# Patient Record
Sex: Male | Born: 1950
Health system: Southern US, Community
[De-identification: ages and names within clinical notes are randomized; demographics above are authoritative.]

## PROBLEM LIST (undated history)

## (undated) DIAGNOSIS — M5126 Other intervertebral disc displacement, lumbar region: Secondary | ICD-10-CM

## (undated) DIAGNOSIS — I219 Acute myocardial infarction, unspecified: Secondary | ICD-10-CM

## (undated) DIAGNOSIS — I10 Essential (primary) hypertension: Secondary | ICD-10-CM

## (undated) DIAGNOSIS — M549 Dorsalgia, unspecified: Secondary | ICD-10-CM

## (undated) DIAGNOSIS — F329 Major depressive disorder, single episode, unspecified: Secondary | ICD-10-CM

## (undated) DIAGNOSIS — C801 Malignant (primary) neoplasm, unspecified: Secondary | ICD-10-CM

## (undated) DIAGNOSIS — R011 Cardiac murmur, unspecified: Secondary | ICD-10-CM

## (undated) DIAGNOSIS — I5189 Other ill-defined heart diseases: Secondary | ICD-10-CM

## (undated) DIAGNOSIS — G4730 Sleep apnea, unspecified: Secondary | ICD-10-CM

## (undated) DIAGNOSIS — F419 Anxiety disorder, unspecified: Secondary | ICD-10-CM

## (undated) DIAGNOSIS — E119 Type 2 diabetes mellitus without complications: Secondary | ICD-10-CM

## (undated) DIAGNOSIS — I2510 Atherosclerotic heart disease of native coronary artery without angina pectoris: Secondary | ICD-10-CM

## (undated) DIAGNOSIS — IMO0001 Reserved for inherently not codable concepts without codable children: Secondary | ICD-10-CM

## (undated) DIAGNOSIS — Z22322 Carrier or suspected carrier of Methicillin resistant Staphylococcus aureus: Secondary | ICD-10-CM

## (undated) DIAGNOSIS — D689 Coagulation defect, unspecified: Secondary | ICD-10-CM

## (undated) DIAGNOSIS — F32A Depression, unspecified: Secondary | ICD-10-CM

## (undated) DIAGNOSIS — G629 Polyneuropathy, unspecified: Secondary | ICD-10-CM

## (undated) DIAGNOSIS — I509 Heart failure, unspecified: Secondary | ICD-10-CM

## (undated) DIAGNOSIS — M1990 Unspecified osteoarthritis, unspecified site: Secondary | ICD-10-CM

## (undated) DIAGNOSIS — R9439 Abnormal result of other cardiovascular function study: Secondary | ICD-10-CM

## (undated) HISTORY — DX: Coagulation defect, unspecified: D68.9

## (undated) HISTORY — PX: TONSILLECTOMY: SUR1361

## (undated) HISTORY — PX: HEMORRHOID SURGERY: SHX153

## (undated) HISTORY — DX: Cardiac murmur, unspecified: R01.1

## (undated) HISTORY — PX: CARPAL TUNNEL RELEASE: SHX101

---

## 1995-03-16 HISTORY — PX: OTHER SURGICAL HISTORY: SHX169

## 2008-10-27 ENCOUNTER — Encounter
Admission: RE | Admit: 2008-10-27 | Discharge: 2008-10-27 | Payer: Self-pay | Admitting: Physical Medicine and Rehabilitation

## 2009-12-11 ENCOUNTER — Emergency Department (HOSPITAL_COMMUNITY): Admission: EM | Admit: 2009-12-11 | Discharge: 2009-12-11 | Payer: Self-pay | Admitting: Emergency Medicine

## 2009-12-15 ENCOUNTER — Emergency Department (HOSPITAL_COMMUNITY): Admission: EM | Admit: 2009-12-15 | Discharge: 2009-12-15 | Payer: Self-pay | Admitting: Emergency Medicine

## 2010-05-28 LAB — DIFFERENTIAL
Basophils Absolute: 0 10*3/uL (ref 0.0–0.1)
Basophils Absolute: 0.1 10*3/uL (ref 0.0–0.1)
Basophils Relative: 0 % (ref 0–1)
Basophils Relative: 1 % (ref 0–1)
Eosinophils Absolute: 0.2 10*3/uL (ref 0.0–0.7)
Eosinophils Relative: 2 % (ref 0–5)
Eosinophils Relative: 2 % (ref 0–5)
Lymphocytes Relative: 32 % (ref 12–46)
Lymphocytes Relative: 39 % (ref 12–46)
Lymphs Abs: 2.9 K/uL (ref 0.7–4.0)
Monocytes Absolute: 0.4 K/uL (ref 0.1–1.0)
Monocytes Absolute: 0.7 10*3/uL (ref 0.1–1.0)
Monocytes Relative: 4 % (ref 3–12)
Neutro Abs: 5.7 10*3/uL (ref 1.7–7.7)
Neutrophils Relative %: 62 % (ref 43–77)

## 2010-05-28 LAB — CBC
HCT: 47.6 % (ref 39.0–52.0)
Hemoglobin: 16.8 g/dL (ref 13.0–17.0)
Hemoglobin: 16.3 g/dL (ref 13.0–17.0)
MCH: 32.8 pg (ref 26.0–34.0)
MCH: 32.8 pg (ref 26.0–34.0)
MCHC: 35.3 g/dL (ref 30.0–36.0)
MCV: 92.8 fL (ref 78.0–100.0)
MCV: 93.6 fL (ref 78.0–100.0)
Platelets: 198 K/uL (ref 150–400)
Platelets: 199 10*3/uL (ref 150–400)
RBC: 5.13 MIL/uL (ref 4.22–5.81)
RBC: 4.97 MIL/uL (ref 4.22–5.81)
RDW: 13.4 % (ref 11.5–15.5)
WBC: 9.2 10*3/uL (ref 4.0–10.5)
WBC: 9.9 10*3/uL (ref 4.0–10.5)

## 2010-05-28 LAB — RAPID URINE DRUG SCREEN, HOSP PERFORMED
Amphetamines: NOT DETECTED
Barbiturates: NOT DETECTED
Barbiturates: NOT DETECTED
Benzodiazepines: NOT DETECTED
Cocaine: NOT DETECTED
Cocaine: NOT DETECTED
Opiates: NOT DETECTED
Opiates: NOT DETECTED
Tetrahydrocannabinol: NOT DETECTED
Tetrahydrocannabinol: NOT DETECTED

## 2010-05-28 LAB — BASIC METABOLIC PANEL WITH GFR
BUN: 12 mg/dL (ref 6–23)
CO2: 27 meq/L (ref 19–32)
Calcium: 9.7 mg/dL (ref 8.4–10.5)
Creatinine, Ser: 0.74 mg/dL (ref 0.4–1.5)
GFR calc non Af Amer: >60 mL/min (ref >60)
Glucose, Bld: 198 mg/dL — ABNORMAL HIGH (ref 70–99)
Sodium: 138 meq/L (ref 135–145)

## 2010-05-28 LAB — COMPREHENSIVE METABOLIC PANEL
AST: 44 U/L — ABNORMAL HIGH (ref 0–37)
Albumin: 3.8 g/dL (ref 3.5–5.2)
Alkaline Phosphatase: 55 U/L (ref 39–117)
BUN: 18 mg/dL (ref 6–23)
CO2: 24 mEq/L (ref 19–32)
Chloride: 99 mEq/L (ref 96–112)
Creatinine, Ser: 0.76 mg/dL (ref 0.4–1.5)
GFR calc non Af Amer: >60 mL/min (ref >60)
Potassium: 3.5 mEq/L (ref 3.5–5.1)
Total Bilirubin: 0.7 mg/dL (ref 0.3–1.2)

## 2010-05-28 LAB — ETHANOL: Alcohol, Ethyl (B): <5 mg/dL (ref 0–10)

## 2010-05-28 LAB — BASIC METABOLIC PANEL
Chloride: 101 mEq/L (ref 96–112)
GFR calc Af Amer: >60 mL/min (ref >60)
Potassium: 3.9 mEq/L (ref 3.5–5.1)

## 2010-05-28 LAB — TRICYCLICS SCREEN, URINE: TCA Scrn: NOT DETECTED

## 2011-01-08 ENCOUNTER — Ambulatory Visit (HOSPITAL_COMMUNITY)
Admission: RE | Admit: 2011-01-08 | Discharge: 2011-01-08 | Disposition: A | Discharge status: Home / Self Care | Payer: BC Managed Care – PPO | Source: Ambulatory Visit | Attending: Neurosurgery | Admitting: Neurosurgery

## 2011-01-08 ENCOUNTER — Encounter (HOSPITAL_COMMUNITY): Payer: Self-pay

## 2011-01-08 ENCOUNTER — Encounter (HOSPITAL_COMMUNITY)
Admission: RE | Admit: 2011-01-08 | Discharge: 2011-01-08 | Disposition: A | Discharge status: Home / Self Care | Payer: BC Managed Care – PPO | Source: Ambulatory Visit | Attending: Neurosurgery | Admitting: Neurosurgery

## 2011-01-08 ENCOUNTER — Other Ambulatory Visit (HOSPITAL_COMMUNITY): Payer: Self-pay | Admitting: Neurosurgery

## 2011-01-08 DIAGNOSIS — Z01810 Encounter for preprocedural cardiovascular examination: Secondary | ICD-10-CM | POA: Insufficient documentation

## 2011-01-08 DIAGNOSIS — Z01818 Encounter for other preprocedural examination: Secondary | ICD-10-CM | POA: Insufficient documentation

## 2011-01-08 DIAGNOSIS — Z01812 Encounter for preprocedural laboratory examination: Secondary | ICD-10-CM | POA: Insufficient documentation

## 2011-01-08 DIAGNOSIS — M4322 Fusion of spine, cervical region: Secondary | ICD-10-CM

## 2011-01-08 HISTORY — DX: Sleep apnea, unspecified: G47.30

## 2011-01-08 LAB — BASIC METABOLIC PANEL
Chloride: 97 mEq/L (ref 96–112)
GFR calc Af Amer: >90 mL/min (ref >90)
GFR calc non Af Amer: >90 mL/min (ref >90)
Potassium: 4.4 mEq/L (ref 3.5–5.1)
Sodium: 134 mEq/L — ABNORMAL LOW (ref 135–145)

## 2011-01-08 LAB — CBC
MCHC: 35.1 g/dL (ref 30.0–36.0)
Platelets: 209 10*3/uL (ref 150–400)
RDW: 13.7 % (ref 11.5–15.5)
WBC: 11.2 10*3/uL — ABNORMAL HIGH (ref 4.0–10.5)

## 2011-01-08 LAB — SURGICAL PCR SCREEN: Staphylococcus aureus: POSITIVE — AB

## 2011-01-08 NOTE — Pre-Procedure Instructions (Signed)
Jonathan Gilmore  01/08/2011  Your procedure is scheduled on:  NOV 5   Report to [facility] at 0745 AM.  Call this number if you have problems the morning of surgery: [phone]   Remember:   Do not eat food:After Midnight. 12 MIDNIGHT  Do not drink clear liquids: 4 Hours before arrival.  Take these medicines the morning of surgery with A SIP OF WATER: PAIN PILL   Do not wear jewelry, make-up or nail polish.  Do not wear lotions, powders, or perfumes. You may wear deodorant.  Do not shave 48 hours prior to surgery.  Do not bring valuables to the hospital.  Contacts, dentures or bridgework may not be worn into surgery.  Leave suitcase in the car. After surgery it may be brought to your room.  For patients admitted to the hospital, checkout time is 11:00 AM the day of discharge.   Patients discharged the day of surgery will not be allowed to drive home.  Name and phone number of your driver: Harlow Mares  [phone]  Special Instructions: CHG Shower Use Special Wash: 1/2 bottle night before surgery and 1/2 bottle morning of surgery. DO NOT              TAKE INSULIN   Please read over the following fact sheets that you were given: Pain Booklet and Coughing and Deep Breathing

## 2011-01-15 NOTE — Consult Note (Signed)
Anesthesia: CXR/labs/EKG reviewed.  Patient with hx of HTN, OSA, DM, former smoker.  No CP or SOB reported at PAT visit.  O2 Sat 94%.  If no acute cardiopulmonary symptoms day of surgery and without abnormal lung sounds, then anticipate can proceed with planned procedure.  Will need CBG checked pre-op.

## 2011-01-18 ENCOUNTER — Encounter (HOSPITAL_COMMUNITY)
Admission: RE | Disposition: A | Discharge status: Home / Self Care | Payer: Self-pay | Source: Ambulatory Visit | Attending: Neurosurgery

## 2011-01-18 ENCOUNTER — Ambulatory Visit (HOSPITAL_COMMUNITY): Payer: BC Managed Care – PPO | Admitting: Vascular Surgery

## 2011-01-18 ENCOUNTER — Encounter (HOSPITAL_COMMUNITY): Payer: Self-pay | Admitting: Vascular Surgery

## 2011-01-18 ENCOUNTER — Ambulatory Visit (HOSPITAL_COMMUNITY): Payer: BC Managed Care – PPO

## 2011-01-18 ENCOUNTER — Encounter (HOSPITAL_COMMUNITY): Payer: Self-pay | Admitting: *Deleted

## 2011-01-18 ENCOUNTER — Inpatient Hospital Stay (HOSPITAL_COMMUNITY)
Admission: RE | Admit: 2011-01-18 | Discharge: 2011-01-20 | DRG: 865 | Disposition: A | Discharge status: Home / Self Care | Payer: BC Managed Care – PPO | Source: Ambulatory Visit | Attending: Neurosurgery | Admitting: Neurosurgery

## 2011-01-18 DIAGNOSIS — E119 Type 2 diabetes mellitus without complications: Secondary | ICD-10-CM | POA: Diagnosis present

## 2011-01-18 DIAGNOSIS — R1310 Dysphagia, unspecified: Secondary | ICD-10-CM | POA: Diagnosis not present

## 2011-01-18 DIAGNOSIS — M4712 Other spondylosis with myelopathy, cervical region: Secondary | ICD-10-CM

## 2011-01-18 DIAGNOSIS — G4730 Sleep apnea, unspecified: Secondary | ICD-10-CM | POA: Diagnosis present

## 2011-01-18 DIAGNOSIS — Z794 Long term (current) use of insulin: Secondary | ICD-10-CM

## 2011-01-18 DIAGNOSIS — Z79899 Other long term (current) drug therapy: Secondary | ICD-10-CM

## 2011-01-18 DIAGNOSIS — M47812 Spondylosis without myelopathy or radiculopathy, cervical region: Principal | ICD-10-CM | POA: Diagnosis present

## 2011-01-18 DIAGNOSIS — Z7982 Long term (current) use of aspirin: Secondary | ICD-10-CM

## 2011-01-18 DIAGNOSIS — I10 Essential (primary) hypertension: Secondary | ICD-10-CM | POA: Diagnosis present

## 2011-01-18 HISTORY — PX: ANTERIOR CERVICAL DECOMP/DISCECTOMY FUSION: SHX1161

## 2011-01-18 LAB — GLUCOSE, CAPILLARY
Glucose-Capillary: 221 mg/dL — ABNORMAL HIGH (ref 70–99)
Glucose-Capillary: 225 mg/dL — ABNORMAL HIGH (ref 70–99)

## 2011-01-18 SURGERY — ANTERIOR CERVICAL DECOMPRESSION/DISCECTOMY FUSION 2 LEVELS
Anesthesia: General | Site: Spine Cervical | Wound class: Clean

## 2011-01-18 MED ORDER — PIOGLITAZONE HCL 15 MG PO TABS
15.0000 mg | ORAL_TABLET | Freq: Two times a day (BID) | ORAL | Status: DC
  Filled 2011-01-18 (×2): qty 1

## 2011-01-18 MED ORDER — OXYCODONE-ACETAMINOPHEN 7.5-325 MG PO TABS: 1.0000 | ORAL_TABLET | ORAL | Status: DC | PRN

## 2011-01-18 MED ORDER — ONDANSETRON HCL 4 MG/2ML IJ SOLN
INTRAMUSCULAR | Status: DC | PRN
  Administered 2011-01-18: 4 mg via INTRAVENOUS

## 2011-01-18 MED ORDER — PROPOFOL 10 MG/ML IV EMUL
INTRAVENOUS | Status: DC | PRN
  Administered 2011-01-18: 200 mg via INTRAVENOUS

## 2011-01-18 MED ORDER — CEFAZOLIN SODIUM 1-5 GM-% IV SOLN
1.0000 g | Freq: Three times a day (TID) | INTRAVENOUS | Status: AC
  Administered 2011-01-18 (×2): 1 g via INTRAVENOUS
  Filled 2011-01-18 (×2): qty 50

## 2011-01-18 MED ORDER — OXYCODONE-ACETAMINOPHEN 5-325 MG PO TABS
1.0000 | ORAL_TABLET | ORAL | Status: DC | PRN
  Administered 2011-01-19 – 2011-01-20 (×3): 2 via ORAL
  Filled 2011-01-18 (×3): qty 2

## 2011-01-18 MED ORDER — METFORMIN HCL 850 MG PO TABS
850.0000 mg | ORAL_TABLET | Freq: Two times a day (BID) | ORAL | Status: DC
  Filled 2011-01-18 (×2): qty 1

## 2011-01-18 MED ORDER — CYCLOBENZAPRINE HCL 10 MG PO TABS
10.0000 mg | ORAL_TABLET | Freq: Three times a day (TID) | ORAL | Status: DC | PRN
  Administered 2011-01-18 – 2011-01-20 (×5): 10 mg via ORAL
  Filled 2011-01-18 (×5): qty 1

## 2011-01-18 MED ORDER — ONDANSETRON HCL 4 MG/2ML IJ SOLN: 4.0000 mg | INTRAMUSCULAR | Status: DC | PRN

## 2011-01-18 MED ORDER — HYDROCODONE-ACETAMINOPHEN 5-325 MG PO TABS
1.0000 | ORAL_TABLET | ORAL | Status: DC | PRN
  Administered 2011-01-18 – 2011-01-19 (×4): 2 via ORAL
  Filled 2011-01-18 (×3): qty 2

## 2011-01-18 MED ORDER — PIOGLITAZONE HCL-METFORMIN HCL 15-850 MG PO TABS
2.0000 | ORAL_TABLET | Freq: Two times a day (BID) | ORAL | Status: DC
Start: 2011-01-18 — End: 2011-01-18

## 2011-01-18 MED ORDER — MEPERIDINE HCL 25 MG/ML IJ SOLN: 6.2500 mg | INTRAMUSCULAR | Status: DC | PRN

## 2011-01-18 MED ORDER — SODIUM CHLORIDE 0.9 % IJ SOLN
3.0000 mL | Freq: Two times a day (BID) | INTRAMUSCULAR | Status: DC
  Administered 2011-01-18 – 2011-01-19 (×3): 3 mL via INTRAVENOUS

## 2011-01-18 MED ORDER — ZOLPIDEM TARTRATE 5 MG PO TABS
5.0000 mg | ORAL_TABLET | Freq: Every evening | ORAL | Status: DC | PRN
  Administered 2011-01-18 – 2011-01-19 (×2): 5 mg via ORAL
  Filled 2011-01-18 (×2): qty 1

## 2011-01-18 MED ORDER — MENTHOL 3 MG MT LOZG: 1.0000 | LOZENGE | OROMUCOSAL | Status: DC | PRN

## 2011-01-18 MED ORDER — HYDROMORPHONE HCL PF 1 MG/ML IJ SOLN
0.2500 mg | INTRAMUSCULAR | Status: DC | PRN
  Administered 2011-01-18: 1 mg via INTRAVENOUS

## 2011-01-18 MED ORDER — CEFAZOLIN SODIUM 1-5 GM-% IV SOLN
INTRAVENOUS | Status: DC | PRN
  Administered 2011-01-18: 2 g via INTRAVENOUS

## 2011-01-18 MED ORDER — ACETAMINOPHEN 325 MG PO TABS: 650.0000 mg | ORAL_TABLET | ORAL | Status: DC | PRN

## 2011-01-18 MED ORDER — HEMOSTATIC AGENTS (NO CHARGE) OPTIME
TOPICAL | Status: DC | PRN
  Administered 2011-01-18: 1 via TOPICAL

## 2011-01-18 MED ORDER — LACTULOSE 10 GM/15ML PO SOLN
30.0000 g | Freq: Two times a day (BID) | ORAL | Status: DC
  Administered 2011-01-18 – 2011-01-19 (×2): 30 g via ORAL
  Filled 2011-01-18 (×6): qty 45

## 2011-01-18 MED ORDER — FENTANYL CITRATE 0.05 MG/ML IJ SOLN
INTRAMUSCULAR | Status: DC | PRN
  Administered 2011-01-18: 100 ug via INTRAVENOUS
  Administered 2011-01-18: 150 ug via INTRAVENOUS

## 2011-01-18 MED ORDER — METFORMIN HCL 500 MG PO TABS
500.0000 mg | ORAL_TABLET | Freq: Three times a day (TID) | ORAL | Status: DC
  Administered 2011-01-19 – 2011-01-20 (×4): 500 mg via ORAL
  Filled 2011-01-18 (×8): qty 1

## 2011-01-18 MED ORDER — HYDROMORPHONE HCL PF 1 MG/ML IJ SOLN
0.2500 mg | INTRAMUSCULAR | Status: DC | PRN
  Administered 2011-01-18 (×2): 0.5 mg via INTRAVENOUS

## 2011-01-18 MED ORDER — THERA M PLUS PO TABS
1.0000 | ORAL_TABLET | Freq: Every day | ORAL | Status: DC
  Administered 2011-01-18 – 2011-01-19 (×2): 1 via ORAL
  Filled 2011-01-18 (×3): qty 1

## 2011-01-18 MED ORDER — INSULIN ASPART 100 UNIT/ML ~~LOC~~ SOLN: 8.0000 [IU] | Freq: Three times a day (TID) | SUBCUTANEOUS | Status: DC

## 2011-01-18 MED ORDER — PHENOL 1.4 % MT LIQD: 1.0000 | OROMUCOSAL | Status: DC | PRN

## 2011-01-18 MED ORDER — ASPIRIN EC 81 MG PO TBEC
81.0000 mg | DELAYED_RELEASE_TABLET | Freq: Every day | ORAL | Status: DC
  Administered 2011-01-19: 81 mg via ORAL
  Filled 2011-01-18 (×2): qty 1

## 2011-01-18 MED ORDER — LISINOPRIL 20 MG PO TABS
20.0000 mg | ORAL_TABLET | Freq: Every day | ORAL | Status: DC
  Administered 2011-01-18 – 2011-01-19 (×2): 20 mg via ORAL
  Filled 2011-01-18 (×3): qty 1

## 2011-01-18 MED ORDER — LACTATED RINGERS IV SOLN
INTRAVENOUS | Status: DC | PRN
  Administered 2011-01-18 (×2): via INTRAVENOUS

## 2011-01-18 MED ORDER — CEFAZOLIN SODIUM 1-5 GM-% IV SOLN
1.0000 g | Freq: Three times a day (TID) | INTRAVENOUS | Status: DC
  Filled 2011-01-18 (×4): qty 50

## 2011-01-18 MED ORDER — INSULIN ASPART 100 UNIT/ML ~~LOC~~ SOLN
0.0000 [IU] | Freq: Once | SUBCUTANEOUS | Status: AC
  Administered 2011-01-18: 8 [IU] via SUBCUTANEOUS

## 2011-01-18 MED ORDER — INSULIN ASPART 100 UNIT/ML ~~LOC~~ SOLN
0.0000 [IU] | Freq: Every day | SUBCUTANEOUS | Status: DC
  Administered 2011-01-19: 3 [IU] via SUBCUTANEOUS

## 2011-01-18 MED ORDER — OXYCODONE HCL 5 MG PO TABS
5.0000 mg | ORAL_TABLET | ORAL | Status: DC | PRN
  Administered 2011-01-18: 10 mg via ORAL
  Filled 2011-01-18: qty 2

## 2011-01-18 MED ORDER — DOCUSATE SODIUM 100 MG PO CAPS
100.0000 mg | ORAL_CAPSULE | Freq: Two times a day (BID) | ORAL | Status: DC
  Administered 2011-01-18 – 2011-01-20 (×5): 100 mg via ORAL
  Filled 2011-01-18 (×5): qty 1

## 2011-01-18 MED ORDER — MULTI-VITAMIN/MINERALS PO TABS: 1.0000 | ORAL_TABLET | Freq: Every day | ORAL | Status: DC

## 2011-01-18 MED ORDER — THROMBIN 5000 UNITS EX KIT
PACK | CUTANEOUS | Status: DC | PRN
  Administered 2011-01-18: 2 via TOPICAL

## 2011-01-18 MED ORDER — ROCURONIUM BROMIDE 100 MG/10ML IV SOLN
INTRAVENOUS | Status: DC | PRN
  Administered 2011-01-18: 20 mg via INTRAVENOUS
  Administered 2011-01-18: 50 mg via INTRAVENOUS

## 2011-01-18 MED ORDER — MIDAZOLAM HCL 5 MG/5ML IJ SOLN
INTRAMUSCULAR | Status: DC | PRN
  Administered 2011-01-18: 2 mg via INTRAVENOUS

## 2011-01-18 MED ORDER — ONDANSETRON HCL 4 MG/2ML IJ SOLN
4.0000 mg | Freq: Once | INTRAMUSCULAR | Status: AC | PRN
  Administered 2011-01-18: 4 mg via INTRAVENOUS

## 2011-01-18 MED ORDER — SODIUM CHLORIDE 0.9 % IR SOLN
Status: DC | PRN
  Administered 2011-01-18 (×2)

## 2011-01-18 MED ORDER — GLYCOPYRROLATE 0.2 MG/ML IJ SOLN
INTRAMUSCULAR | Status: DC | PRN
  Administered 2011-01-18: .6 mg via INTRAVENOUS

## 2011-01-18 MED ORDER — INSULIN ASPART 100 UNIT/ML ~~LOC~~ SOLN
0.0000 [IU] | Freq: Three times a day (TID) | SUBCUTANEOUS | Status: DC
  Administered 2011-01-18: 8 [IU] via SUBCUTANEOUS
  Administered 2011-01-19: 5 [IU] via SUBCUTANEOUS
  Administered 2011-01-19: 3 [IU] via SUBCUTANEOUS
  Administered 2011-01-19: 5 [IU] via SUBCUTANEOUS
  Administered 2011-01-20: 3 [IU] via SUBCUTANEOUS
  Filled 2011-01-18: qty 3

## 2011-01-18 MED ORDER — HYDROMORPHONE HCL PF 1 MG/ML IJ SOLN
0.5000 mg | INTRAMUSCULAR | Status: DC | PRN
  Administered 2011-01-18: 1 mg via INTRAVENOUS
  Filled 2011-01-18: qty 1

## 2011-01-18 MED ORDER — SODIUM CHLORIDE 0.9 % IJ SOLN: 3.0000 mL | INTRAMUSCULAR | Status: DC | PRN

## 2011-01-18 MED ORDER — ACETAMINOPHEN 650 MG RE SUPP: 650.0000 mg | RECTAL | Status: DC | PRN

## 2011-01-18 MED ORDER — TESTOSTERONE CYPIONATE 100 MG/ML IM SOLN: 200.0000 mg | INTRAMUSCULAR | Status: DC

## 2011-01-18 MED ORDER — NEOSTIGMINE METHYLSULFATE 1 MG/ML IJ SOLN
INTRAMUSCULAR | Status: DC | PRN
  Administered 2011-01-18: 5 mg via INTRAVENOUS

## 2011-01-18 SURGICAL SUPPLY — 54 items
BAG DECANTER FOR FLEXI CONT (MISCELLANEOUS) ×2 IMPLANT
BENZOIN TINCTURE PRP APPL 2/3 (GAUZE/BANDAGES/DRESSINGS) ×2 IMPLANT
BRUSH SCRUB EZ PLAIN DRY (MISCELLANEOUS) ×2 IMPLANT
BUR MATCHSTICK NEURO 3.0 LAGG (BURR) ×2 IMPLANT
CANISTER SUCTION 2500CC (MISCELLANEOUS) ×2 IMPLANT
CLOSURE STERI STRIP 1/2 X4 (GAUZE/BANDAGES/DRESSINGS) ×2 IMPLANT
CLOTH BEACON ORANGE TIMEOUT ST (SAFETY) ×2 IMPLANT
CONT SPEC 4OZ CLIKSEAL STRL BL (MISCELLANEOUS) ×2 IMPLANT
DRAPE C-ARM 42X72 X-RAY (DRAPES) ×4 IMPLANT
DRAPE LAPAROTOMY 100X72 PEDS (DRAPES) ×2 IMPLANT
DRAPE MICROSCOPE LEICA (MISCELLANEOUS) ×2 IMPLANT
DRAPE POUCH INSTRU U-SHP 10X18 (DRAPES) ×2 IMPLANT
DRILL BIT (BIT) ×2 IMPLANT
ELECT COATED BLADE 2.86 ST (ELECTRODE) ×2 IMPLANT
ELECT REM PT RETURN 9FT ADLT (ELECTROSURGICAL) ×2
ELECTRODE REM PT RTRN 9FT ADLT (ELECTROSURGICAL) ×1 IMPLANT
GAUZE SPONGE 4X4 16PLY XRAY LF (GAUZE/BANDAGES/DRESSINGS) IMPLANT
GLOVE BIOGEL PI IND STRL 7.0 (GLOVE) ×1 IMPLANT
GLOVE BIOGEL PI INDICATOR 7.0 (GLOVE) ×1
GLOVE ECLIPSE 8.5 STRL (GLOVE) ×2 IMPLANT
GLOVE EXAM NITRILE LRG STRL (GLOVE) IMPLANT
GLOVE EXAM NITRILE MD LF STRL (GLOVE) IMPLANT
GLOVE EXAM NITRILE XL STR (GLOVE) IMPLANT
GLOVE EXAM NITRILE XS STR PU (GLOVE) IMPLANT
GLOVE OPTIFIT SS 6.5 STRL BRWN (GLOVE) ×4 IMPLANT
GLOVE SURG SS PI 7.0 STRL IVOR (GLOVE) ×2 IMPLANT
GOWN BRE IMP SLV AUR LG STRL (GOWN DISPOSABLE) ×2 IMPLANT
GOWN BRE IMP SLV AUR XL STRL (GOWN DISPOSABLE) ×4 IMPLANT
GOWN STRL REIN 2XL LVL4 (GOWN DISPOSABLE) IMPLANT
HEAD HALTER (SOFTGOODS) ×2 IMPLANT
HEMOSTAT SURGICEL 2X14 (HEMOSTASIS) IMPLANT
KIT BASIN OR (CUSTOM PROCEDURE TRAY) ×2 IMPLANT
KIT ROOM TURNOVER OR (KITS) ×2 IMPLANT
NEEDLE SPNL 20GX3.5 QUINCKE YW (NEEDLE) ×2 IMPLANT
NS IRRIG 1000ML POUR BTL (IV SOLUTION) ×2 IMPLANT
PACK LAMINECTOMY NEURO (CUSTOM PROCEDURE TRAY) ×2 IMPLANT
PAD ARMBOARD 7.5X6 YLW CONV (MISCELLANEOUS) ×6 IMPLANT
PLATE ACP ATL VISION 45MM (Plate) ×2 IMPLANT
RUBBERBAND STERILE (MISCELLANEOUS) ×4 IMPLANT
SCREW VA ALT VISION 4.0X13 (Screw) ×12 IMPLANT
SPACER BONE CORNERSTONE 7X14 (Orthopedic Implant) ×2 IMPLANT
SPACER BONE CORNERSTONE 8X14 (Orthopedic Implant) ×2 IMPLANT
SPONGE GAUZE 4X4 12PLY (GAUZE/BANDAGES/DRESSINGS) ×2 IMPLANT
SPONGE INTESTINAL PEANUT (DISPOSABLE) ×2 IMPLANT
SPONGE SURGIFOAM ABS GEL SZ50 (HEMOSTASIS) ×2 IMPLANT
STRIP CLOSURE SKIN 1/2X4 (GAUZE/BANDAGES/DRESSINGS) ×2 IMPLANT
SUT PDS AB 5-0 P3 18 (SUTURE) ×2 IMPLANT
SUT VIC AB 3-0 SH 8-18 (SUTURE) ×2 IMPLANT
SYR 20ML ECCENTRIC (SYRINGE) ×2 IMPLANT
TAPE CLOTH 4X10 WHT NS (GAUZE/BANDAGES/DRESSINGS) ×2 IMPLANT
TAPE CLOTH SURG 4X10 WHT LF (GAUZE/BANDAGES/DRESSINGS) ×2 IMPLANT
TOWEL OR 17X24 6PK STRL BLUE (TOWEL DISPOSABLE) ×2 IMPLANT
TOWEL OR 17X26 10 PK STRL BLUE (TOWEL DISPOSABLE) ×2 IMPLANT
WATER STERILE IRR 1000ML POUR (IV SOLUTION) ×2 IMPLANT

## 2011-01-18 NOTE — Op Note (Signed)
Date of operation 01/18/2011 Date of dictation 01/18/2011  Attending physician Julio Sicks Service neurosurgery Preoperative diagnoses: C5-6 C6-7 stenosis Postoperative diagnosis: Same Procedure name: C5-6 C6-7 anterior cervical discectomy and fusion with allograft and anterior plating Surgeon: Julio Sicks Asst. Dr. Wynetta Emery Anesthesia: Gen. oral endotracheal.  Indications: Mr. Prowse is a 60 year old male with history of neck and left upper cavity pain paresthesias and weakness consistent with a C6 and C7 her a copy failing conservative management. He presents now for C5-6 and C6-7 anterior cervical decompression infusion in hopes of improving his symptoms patient is aware the risks and benefits and wishes to proceed.  Operative note: Patient in the operating room placed on table in supine position. After adequate level of anesthesia is achieved the patient is positioned supine with his neck  Extended and held in place with halter traction. Anterior cervical region was prepped and draped sterilely. 10 blade used to make a linear skin incision overlying the C6 vertebral level. Dissection and prior carried down sharply to the platysma. This was then divided vertically and dissection proceeded on the medial border of the sternocleidomastoid muscle and carotid sheath. Trachea is then retracted towards the left prevertebral fascia stripped off the anterior spinal column. Longus colli muscles and elevated bilaterally somewhat scarred. Deep self-retaining retractors placed, intraoperative fluoroscopy space used and the levels were confirmed. The disc space at C5-6 and C6-7 were incised with a  15 blade in a  rectangular fashion. A wide disc space cleanout was performed with pituitary rongeurs, forward and backward angle Carlen curettes, Kerrison rongeurs,and  the high-speed drill. The disc was removed down to the posterior annulus. Microscope brought into the  the field and used throughout the remainder of the  discectomy. Remaining aspects of annulus were removed with his high-speed drill then the posterior longitudinal ligament was removed in a piecemeal fashion. Kerrison rongeurs were then used to undercut the bodies of C5 and C6. C6. Decompression of the nature of foramen. Wide anterior foraminotomies in the form of course exiting C6 nerve roots bilaterally. At this point a very 30 decompression achieved. There is no evidence of injury to thecal sac and root. We then irrigated out solution. Gelfoam with postoperative hemostasis which Ascent be good. Seizures and repeated at C6-7 again without complication. The space at C5-6 and C6-7 and prepared for fusion. Cornerstone allograft wedges and packed in place of birth levels and recessed with the 1 mm and the intervertebral margin tear 45 mg in his anterior cervical plate was then placed over the C5 and C6 and C7 levels. This infection or fluoroscopic guidance using 13 mm verbal sutures to each of the 3 levels. All 6 screws given a final tightening be solid and bone. Locking screws engaged at all 3 levels. Final images revealed additional varus or prior probable neuroma spine. Wound is then carried out MI solution. Hemostasis was assured with bipolar try repaired wounds and close in a typical fashion. Steri-Strips triggers were applied. There were no apparent complications. Patient tolerated the procedure well and returned to the recovery postop. Signed Julio Sicks

## 2011-01-18 NOTE — Anesthesia Postprocedure Evaluation (Signed)
  Anesthesia Post-op Note  Patient: Jonathan Gilmore  Procedure(s) Performed:  ANTERIOR CERVICAL DECOMPRESSION/DISCECTOMY FUSION 2 LEVELS - anterior cervical discectomy and fusion with allograft and plating, cervical five-six, cervical six-seven  Patient Location: PACU  Anesthesia Type: General  Level of Consciousness: awake and alert   Airway and Oxygen Therapy: Patient Spontanous Breathing and Patient connected to nasal cannula oxygen  Post-op Pain: moderate  Post-op Assessment: Post-op Vital signs reviewed, Patient's Cardiovascular Status Stable, Respiratory Function Stable, Patent Airway and Pain level controlled  Post-op Vital Signs: Reviewed and stable  Complications: No apparent anesthesia complications

## 2011-01-18 NOTE — Brief Op Note (Signed)
01/18/2011  4:01 PM  PATIENT:  Jonathan Gilmore  60 y.o. male  PRE-OPERATIVE DIAGNOSIS:  cervical five-six, cervical six-seven spondylosis with stenosis  POST-OPERATIVE DIAGNOSIS:  cervical five-six, cervical six-seven spondylosis with stenosis  PROCEDURE:  Procedure(s): ANTERIOR CERVICAL DECOMPRESSION/DISCECTOMY FUSION 2 LEVELS  SURGEON:  Glada Wickstrom  PHYSICIAN ASSISTANT:   ASSISTANTS: cram   ANESTHESIA:   general  EBL:  Total I/O In: 1700 [I.V.:1700] Out: 75 [Blood:75]  BLOOD ADMINISTERED:none  DRAINS: none   LOCAL MEDICATIONS USED:  NONE  SPECIMEN:  No Specimen  DISPOSITION OF SPECIMEN:  N/A  COUNTS:  YES  TOURNIQUET:  * No tourniquets in log *  DICTATION: .Dragon Dictation  PLAN OF CARE: Admit to inpatient   PATIENT DISPOSITION:  PACU - hemodynamically stable.   Delay start of Pharmacological VTE agent (>24hrs) due to surgical blood loss or risk of bleeding:  yes

## 2011-01-18 NOTE — Transfer of Care (Signed)
Immediate Anesthesia Transfer of Care Note  Patient: Jonathan Gilmore  Procedure(s) Performed:  ANTERIOR CERVICAL DECOMPRESSION/DISCECTOMY FUSION 2 LEVELS - anterior cervical discectomy and fusion with allograft and plating, cervical five-six, cervical six-seven  Patient Location: PACU  Anesthesia Type: General  Level of Consciousness: awake and alert   Airway & Oxygen Therapy: Patient Spontanous Breathing  Post-op Assessment: Report given to PACU RN  Post vital signs: stable  Complications: No apparent anesthesia complications

## 2011-01-18 NOTE — Anesthesia Procedure Notes (Addendum)
Procedure Name: Intubation Date/Time: 01/18/2011 10:14 AM Performed by: Caryn Bee Pre-anesthesia Checklist: Patient identified, Emergency Drugs available, Suction available, Patient being monitored and Timeout performed Patient Re-evaluated:Patient Re-evaluated prior to inductionOxygen Delivery Method: Circle System Utilized Preoxygenation: Pre-oxygenation with 100% oxygen Intubation Type: IV induction Ventilation: Mask ventilation without difficulty Laryngoscope Size: Mac and 4 Grade View: Grade II Tube type: Oral Tube size: 7.5 mm Number of attempts: 1 Airway Equipment and Method: stylet Placement Confirmation: ETT inserted through vocal cords under direct vision,  positive ETCO2 and breath sounds checked- equal and bilateral Secured at: 22 cm Tube secured with: Tape Dental Injury: Teeth and Oropharynx as per pre-operative assessment

## 2011-01-18 NOTE — H&P (Signed)
Jonathan Gilmore is an 60 y.o. male.   Chief Complaint: Neck pain HPI: A 60 year old patient with chronic neck pain failing conservative management. Pain is mostly nonradicular but occasionally associated with some numbness paresthesia. Workup demonstrated significant spondylosis and stenosis at C5-6 and C6-7.  Past Medical History  Diagnosis Date  . Hypertension   . Sleep apnea     tested at sleep apnea at [facility]  . Diabetes mellitus     since 1993    Past Surgical History  Procedure Date  . Bone spur removed     back in 1997  . Hemorrhoid surgery     No family history on file. Social History:  reports that he quit smoking 10 days ago. His smoking use included Cigarettes. He smoked 1 pack per day. He has quit using smokeless tobacco. He reports that he drinks alcohol. He reports that he does not use illicit drugs.  Allergies:  Allergies  Allergen Reactions  . Morphine And Related Nausea And Vomiting    No current facility-administered medications on file as of 01/18/2011.   Medications Prior to Admission  Medication Sig Dispense Refill  . aspirin EC 81 MG tablet Take 81 mg by mouth daily.       . Flaxseed, Linseed, (FLAXSEED OIL PO) Take 1 tablet by mouth daily.       . insulin lispro (HUMALOG) 100 UNIT/ML injection Inject 8 Units into the skin 3 (three) times daily before meals.       . lactulose (CHRONULAC) 10 GM/15ML solution Take 30 g by mouth 2 (two) times daily.        . metFORMIN (GLUCOPHAGE) 500 MG tablet Take 500 mg by mouth 3 (three) times daily with meals.       . Multiple Vitamins-Minerals (MULTIVITAMIN WITH MINERALS) tablet Take 1 tablet by mouth daily.       Marland Kitchen oxyCODONE-acetaminophen (PERCOCET) 7.5-325 MG per tablet Take 1-2 tablets by mouth every 4 (four) hours as needed. For pain       . pioglitazone-metformin (ACTOPLUS MET) 15-850 MG per tablet Take 2 tablets by mouth 2 (two) times daily with a meal. 2 tabs with lunch and 2 tabs at bedtime      .  testosterone cypionate (DEPOTESTOTERONE CYPIONATE) 100 MG/ML injection Inject 200 mg into the muscle every 14 (fourteen) days.         Results for orders placed during the hospital encounter of 01/18/11 (from the past 48 hour(s))  GLUCOSE, CAPILLARY     Status: Abnormal   Collection Time   01/18/11  7:41 AM      Component Value Range Comment   Glucose-Capillary 221 (*) 70 - 99 (mg/dL)    No results found.  Review of Systems  Constitutional: Negative.   HENT: Negative.   Eyes: Negative.   Respiratory: Negative.   Cardiovascular: Negative.   Gastrointestinal: Negative.   Genitourinary: Negative.   Musculoskeletal: Negative.   Skin: Negative.   Neurological: Negative.   Endo/Heme/Allergies: Negative.   Psychiatric/Behavioral: Negative.     Blood pressure 130/83, pulse 79, temperature 97.9 F (36.6 C), temperature source Oral, resp. rate 18, SpO2 95.00%. Physical Exam  Constitutional: He is oriented to person, place, and time. He appears well-developed.  HENT:  Head: Normocephalic.  Eyes: Conjunctivae and EOM are normal. Pupils are equal, round, and reactive to light.  Neck: No JVD present. No tracheal deviation present. No Brudzinski's sign and no Kernig's sign noted. No thyromegaly present.  Cardiovascular: Normal rate  and regular rhythm.   Respiratory: Effort normal. No stridor.  GI: Soft. Bowel sounds are normal.  Musculoskeletal: Normal range of motion.  Lymphadenopathy:    He has no cervical adenopathy.  Neurological: He is alert and oriented to person, place, and time. He has normal reflexes. He displays normal reflexes. No cranial nerve deficit. He exhibits normal muscle tone. Coordination normal. GCS eye subscore is 4. GCS verbal subscore is 5. GCS motor subscore is 6.  Skin: Skin is warm.     Assessment/Plan Cervical spondylosis with stenosis C5-6 C6-7. Plan for C5-6 C6-7 anterior cervical discectomy and fusion allograft and anterior plating. Risks and benefits have  been explained. Patient wishes to proceed.  Jonathan Gilmore A 01/18/2011, 7:58 AM

## 2011-01-18 NOTE — Anesthesia Preprocedure Evaluation (Addendum)
Anesthesia Evaluation  Patient identified by MRN, date of birth, ID band Patient awake    Reviewed: Allergy & Precautions, NPO status   History of Anesthesia Complications (+) AWARENESS UNDER ANESTHESIAHistory of anesthetic complications: Left shoulder surg 1997 [place] in middle of case.  I explained use of BIS.  Airway Mallampati: II TM Distance: >3 FB Neck ROM: Full    Dental No notable dental hx. (+) Teeth Intact   Pulmonary sleep apnea (uses cpap occ) and Continuous Positive Airway Pressure Ventilation , Current Smoker,  clear to auscultation        Cardiovascular hypertension, Pt. on medications Regular Normal    Neuro/Psych    GI/Hepatic   Endo/Other  Diabetes mellitus-, Well Controlled, Type 2, Insulin DependentMorbid obesity  Renal/GU      Musculoskeletal   Abdominal   Peds  Hematology   Anesthesia Other Findings   Reproductive/Obstetrics                      Anesthesia Physical Anesthesia Plan  ASA: III  Anesthesia Plan: General   Post-op Pain Management:    Induction: Intravenous  Airway Management Planned: Oral ETT  Additional Equipment:   Intra-op Plan:   Post-operative Plan: Extubation in OR  Informed Consent: I have reviewed the patients History and Physical, chart, labs and discussed the procedure including the risks, benefits and alternatives for the proposed anesthesia with the patient or authorized representative who has indicated his/her understanding and acceptance.   Dental advisory given  Plan Discussed with: CRNA and Surgeon  Anesthesia Plan Comments:         Anesthesia Quick Evaluation

## 2011-01-19 DIAGNOSIS — M4712 Other spondylosis with myelopathy, cervical region: Secondary | ICD-10-CM

## 2011-01-19 LAB — GLUCOSE, CAPILLARY
Glucose-Capillary: 213 mg/dL — ABNORMAL HIGH (ref 70–99)
Glucose-Capillary: 188 mg/dL — ABNORMAL HIGH (ref 70–99)
Glucose-Capillary: 259 mg/dL — ABNORMAL HIGH (ref 70–99)

## 2011-01-19 MED ORDER — HYDROCODONE-ACETAMINOPHEN 5-325 MG PO TABS: 1.0000 | ORAL_TABLET | ORAL | Status: DC | PRN

## 2011-01-19 MED ORDER — INFLUENZA VIRUS VACC SPLIT PF IM SUSP
0.5000 mL | Freq: Once | INTRAMUSCULAR | Status: AC
  Administered 2011-01-19: 0.5 mL via INTRAMUSCULAR
  Filled 2011-01-19: qty 0.5

## 2011-01-19 MED ORDER — CYCLOBENZAPRINE HCL 10 MG PO TABS: 10.0000 mg | ORAL_TABLET | Freq: Three times a day (TID) | ORAL | Status: AC | PRN

## 2011-01-19 NOTE — Discharge Summary (Signed)
Physician Discharge Summary  Patient ID: Jonathan Gilmore MRN: [id number] DOB/AGE: [birth year] 60 y.o.  Admit date: 01/18/2011 Discharge date: 01/19/2011  Admission Diagnoses:  Discharge Diagnoses:  Active Problems:  * No active hospital problems. *    Discharged Condition: good  Hospital Course: H. admitted and underwent uncomplicated anterior cervical discectomy and fusion at 2 levels. Postoperatively patient was awakened with improved pain. He has some continued neck soreness but his left upper extremity pain and function is improved. At time of discharge his wound is healing well. He died. Neurologically his exam is intact. Discharge hnonesults: none  Significant Diagnostic Studies:   Treatments: surgery: ACDF  Discharge Exam: Blood pressure 133/74, pulse 89, temperature 98.4 F (36.9 C), temperature source Oral, resp. rate 22, height 5\' 7"  (1.702 m), weight 110.678 kg (244 lb), SpO2 97.00%. General appearance: alert and cooperative Neck: no adenopathy, no carotid bruit, no JVD, supple, symmetrical, trachea midline and thyroid not enlarged, symmetric, no tenderness/mass/nodules Neurologic: Alert and oriented X 3, normal strength and tone. Normal symmetric reflexes. Normal coordination and gait Sensory: normal Motor: grossly normal Incision/Wound:Healing well  Disposition: Final discharge disposition not confirmed   Current Discharge Medication List    START taking these medications   Details  cyclobenzaprine (FLEXERIL) 10 MG tablet Take 1 tablet (10 mg total) by mouth 3 (three) times daily as needed for muscle spasms. Qty: 30 tablet, Refills: 1    HYDROcodone-acetaminophen (NORCO) 5-325 MG per tablet Take 1-2 tablets by mouth every 4 (four) hours as needed. Qty: 80 tablet, Refills: 1      CONTINUE these medications which have NOT CHANGED   Details  aspirin EC 81 MG tablet Take 81 mg by mouth daily.     Flaxseed, Linseed, (FLAXSEED OIL PO) Take 1 tablet by mouth daily.      insulin lispro (HUMALOG) 100 UNIT/ML injection Inject 8 Units into the skin 3 (three) times daily before meals.     lactulose (CHRONULAC) 10 GM/15ML solution Take 30 g by mouth 2 (two) times daily.      lisinopril (PRINIVIL,ZESTRIL) 20 MG tablet Take 20 mg by mouth daily.      metFORMIN (GLUCOPHAGE) 500 MG tablet Take 500 mg by mouth 3 (three) times daily with meals.     Multiple Vitamins-Minerals (MULTIVITAMIN WITH MINERALS) tablet Take 1 tablet by mouth daily.     oxyCODONE-acetaminophen (PERCOCET) 7.5-325 MG per tablet Take 1-2 tablets by mouth every 4 (four) hours as needed. For pain     pioglitazone-metformin (ACTOPLUS MET) 15-850 MG per tablet Take 2 tablets by mouth 2 (two) times daily with a meal. 2 tabs with lunch and 2 tabs at bedtime    testosterone cypionate (DEPOTESTOTERONE CYPIONATE) 100 MG/ML injection Inject 200 mg into the muscle every 14 (fourteen) days.          Signed: Windy Dudek A 01/19/2011, 8:02 AM

## 2011-01-19 NOTE — Progress Notes (Signed)
I came by to place patient on cpap for the night. Patient said he was leaving at 0830 in the morning & he didn't want to wear it tonight.

## 2011-01-19 NOTE — Progress Notes (Signed)
Incision is more swollen and area around incision is red.  Back of neck is red and swollen as well. Pt is c/o more difficulty swallowing than this morning.  Pt given ice pack.   MD notified and will come to see pt.  Will continue to monitor.

## 2011-01-19 NOTE — Progress Notes (Signed)
  Patient with some dysphagia.  No stridor. No SOB .  H/O sleep apnea.  Wound looks good.  A little normal swelling but no evidence of clot.  Trachea midline. BS equal bilaterally.  Neuro intact..  Imp:  Post op neck pain swelling.  No evidence of airway compromise.. Given patients size and distance from home we will observe for one more day.

## 2011-01-20 LAB — GLUCOSE, CAPILLARY: Glucose-Capillary: 199 mg/dL — ABNORMAL HIGH (ref 70–99)

## 2011-01-20 MED ORDER — OXYCODONE-ACETAMINOPHEN 7.5-325 MG PO TABS
1.0000 | ORAL_TABLET | ORAL | Status: DC | PRN
Start: 2011-01-20 — End: 2011-08-23

## 2011-01-20 NOTE — Progress Notes (Signed)
  Feeling better today.. Minimal neck pain, Swallowing better.  Motor exam intact.  stilll with some left ue parathesias, but improved from preop.  Wound healing well.     DC home

## 2011-01-20 NOTE — Discharge Summary (Signed)
  DC Addendum   Patient stayed an additional day due to neck pain.  Symptoms now better. Plan DC home.

## 2011-01-25 ENCOUNTER — Encounter (HOSPITAL_COMMUNITY): Payer: Self-pay | Admitting: Neurosurgery

## 2011-02-10 ENCOUNTER — Ambulatory Visit
Admission: RE | Admit: 2011-02-10 | Discharge: 2011-02-10 | Disposition: A | Discharge status: Home / Self Care | Payer: BC Managed Care – PPO | Source: Ambulatory Visit | Attending: Neurosurgery | Admitting: Neurosurgery

## 2011-02-10 ENCOUNTER — Other Ambulatory Visit: Payer: Self-pay | Admitting: Neurosurgery

## 2011-02-10 DIAGNOSIS — M542 Cervicalgia: Secondary | ICD-10-CM

## 2011-04-12 ENCOUNTER — Ambulatory Visit: Payer: BC Managed Care – PPO | Attending: Neurosurgery | Admitting: Physical Therapy

## 2011-04-12 DIAGNOSIS — M25539 Pain in unspecified wrist: Secondary | ICD-10-CM | POA: Insufficient documentation

## 2011-04-12 DIAGNOSIS — M542 Cervicalgia: Secondary | ICD-10-CM | POA: Insufficient documentation

## 2011-04-12 DIAGNOSIS — M25519 Pain in unspecified shoulder: Secondary | ICD-10-CM | POA: Insufficient documentation

## 2011-04-12 DIAGNOSIS — R5381 Other malaise: Secondary | ICD-10-CM | POA: Insufficient documentation

## 2011-04-12 DIAGNOSIS — IMO0001 Reserved for inherently not codable concepts without codable children: Secondary | ICD-10-CM | POA: Insufficient documentation

## 2011-04-19 ENCOUNTER — Encounter: Payer: BC Managed Care – PPO | Admitting: Physical Therapy

## 2011-05-26 ENCOUNTER — Other Ambulatory Visit: Payer: Self-pay | Admitting: Neurosurgery

## 2011-05-26 DIAGNOSIS — M542 Cervicalgia: Secondary | ICD-10-CM

## 2011-06-02 ENCOUNTER — Ambulatory Visit
Admission: RE | Admit: 2011-06-02 | Discharge: 2011-06-02 | Disposition: A | Discharge status: Home / Self Care | Payer: BC Managed Care – PPO | Source: Ambulatory Visit | Attending: Neurosurgery | Admitting: Neurosurgery

## 2011-06-02 DIAGNOSIS — M542 Cervicalgia: Secondary | ICD-10-CM

## 2011-08-23 ENCOUNTER — Encounter (HOSPITAL_COMMUNITY): Payer: Self-pay

## 2011-08-23 ENCOUNTER — Emergency Department (HOSPITAL_COMMUNITY)
Admission: EM | Admit: 2011-08-23 | Discharge: 2011-08-23 | Disposition: A | Discharge status: Home / Self Care | Payer: BC Managed Care – PPO | Attending: Emergency Medicine | Admitting: Emergency Medicine

## 2011-08-23 DIAGNOSIS — I10 Essential (primary) hypertension: Secondary | ICD-10-CM | POA: Insufficient documentation

## 2011-08-23 DIAGNOSIS — M549 Dorsalgia, unspecified: Secondary | ICD-10-CM

## 2011-08-23 DIAGNOSIS — G4730 Sleep apnea, unspecified: Secondary | ICD-10-CM | POA: Insufficient documentation

## 2011-08-23 DIAGNOSIS — E119 Type 2 diabetes mellitus without complications: Secondary | ICD-10-CM | POA: Insufficient documentation

## 2011-08-23 DIAGNOSIS — M545 Low back pain, unspecified: Secondary | ICD-10-CM | POA: Insufficient documentation

## 2011-08-23 DIAGNOSIS — H6690 Otitis media, unspecified, unspecified ear: Secondary | ICD-10-CM | POA: Insufficient documentation

## 2011-08-23 HISTORY — DX: Dorsalgia, unspecified: M54.9

## 2011-08-23 LAB — URINALYSIS, ROUTINE W REFLEX MICROSCOPIC
Bilirubin Urine: NEGATIVE
Hgb urine dipstick: NEGATIVE
Specific Gravity, Urine: 1.015 (ref 1.005–1.030)
Urobilinogen, UA: 0.2 mg/dL (ref 0.0–1.0)

## 2011-08-23 MED ORDER — KETOROLAC TROMETHAMINE 60 MG/2ML IM SOLN
60.0000 mg | Freq: Once | INTRAMUSCULAR | Status: AC
  Administered 2011-08-23: 60 mg via INTRAMUSCULAR
  Filled 2011-08-23: qty 2

## 2011-08-23 MED ORDER — PREDNISONE 20 MG PO TABS
60.0000 mg | ORAL_TABLET | Freq: Once | ORAL | Status: AC
  Administered 2011-08-23: 60 mg via ORAL
  Filled 2011-08-23: qty 3

## 2011-08-23 MED ORDER — HYDROMORPHONE HCL PF 1 MG/ML IJ SOLN
1.0000 mg | Freq: Once | INTRAMUSCULAR | Status: AC
  Administered 2011-08-23: 1 mg via INTRAMUSCULAR
  Filled 2011-08-23: qty 1

## 2011-08-23 MED ORDER — HYDROCODONE-ACETAMINOPHEN 7.5-750 MG PO TABS: 1.0000 | ORAL_TABLET | Freq: Four times a day (QID) | ORAL | Status: DC | PRN

## 2011-08-23 MED ORDER — PREDNISONE 10 MG PO TABS: 20.0000 mg | ORAL_TABLET | Freq: Every day | ORAL | Status: DC

## 2011-08-23 MED ORDER — DIAZEPAM 5 MG PO TABS: 5.0000 mg | ORAL_TABLET | Freq: Four times a day (QID) | ORAL | Status: AC | PRN

## 2011-08-23 MED ORDER — DIAZEPAM 5 MG PO TABS
10.0000 mg | ORAL_TABLET | Freq: Once | ORAL | Status: AC
  Administered 2011-08-23: 10 mg via ORAL
  Filled 2011-08-23: qty 2

## 2011-08-23 MED ORDER — HYDROMORPHONE HCL PF 1 MG/ML IJ SOLN: 1.0000 mg | Freq: Once | INTRAMUSCULAR | Status: DC

## 2011-08-23 NOTE — Discharge Instructions (Signed)
Back Pain, Adult Low back pain is very common. About 1 in 5 people have back pain.The cause of low back pain is rarely dangerous. The pain often gets better over time.About half of people with a sudden onset of back pain feel better in just 2 weeks. About 8 in 10 people feel better by 6 weeks.  CAUSES Some common causes of back pain include:  Strain of the muscles or ligaments supporting the spine.   Wear and tear (degeneration) of the spinal discs.   Arthritis.   Direct injury to the back.  DIAGNOSIS Most of the time, the direct cause of low back pain is not known.However, back pain can be treated effectively even when the exact cause of the pain is unknown.Answering your caregiver's questions about your overall health and symptoms is one of the most accurate ways to make sure the cause of your pain is not dangerous. If your caregiver needs more information, he or she may order lab work or imaging tests (X-rays or MRIs).However, even if imaging tests show changes in your back, this usually does not require surgery. HOME CARE INSTRUCTIONS For many people, back pain returns.Since low back pain is rarely dangerous, it is often a condition that people can learn to manageon their own.   Remain active. It is stressful on the back to sit or stand in one place. Do not sit, drive, or stand in one place for more than 30 minutes at a time. Take short walks on level surfaces as soon as pain allows.Try to increase the length of time you walk each day.   Do not stay in bed.Resting more than 1 or 2 days can delay your recovery.   Do not avoid exercise or work.Your body is made to move.It is not dangerous to be active, even though your back may hurt.Your back will likely heal faster if you return to being active before your pain is gone.   Pay attention to your body when you bend and lift. Many people have less discomfortwhen lifting if they bend their knees, keep the load close to their  bodies,and avoid twisting. Often, the most comfortable positions are those that put less stress on your recovering back.   Find a comfortable position to sleep. Use a firm mattress and lie on your side with your knees slightly bent. If you lie on your back, put a pillow under your knees.   Only take over-the-counter or prescription medicines as directed by your caregiver. Over-the-counter medicines to reduce pain and inflammation are often the most helpful.Your caregiver may prescribe muscle relaxant drugs.These medicines help dull your pain so you can more quickly return to your normal activities and healthy exercise.   Put ice on the injured area.   Put ice in a plastic bag.   Place a towel between your skin and the bag.   Leave the ice on for 15 to 20 minutes, 3 to 4 times a day for the first 2 to 3 days. After that, ice and heat may be alternated to reduce pain and spasms.   Ask your caregiver about trying back exercises and gentle massage. This may be of some benefit.   Avoid feeling anxious or stressed.Stress increases muscle tension and can worsen back pain.It is important to recognize when you are anxious or stressed and learn ways to manage it.Exercise is a great option.  SEEK MEDICAL CARE IF:  You have pain that is not relieved with rest or medicine.   You have   pain that does not improve in 1 week.   You have new symptoms.   You are generally not feeling well.  SEEK IMMEDIATE MEDICAL CARE IF:   You have pain that radiates from your back into your legs.   You develop new bowel or bladder control problems.   You have unusual weakness or numbness in your arms or legs.   You develop nausea or vomiting.   You develop abdominal pain.   You feel faint.  Document Released: 03/01/2005 Document Revised: 02/18/2011 Document Reviewed: 07/20/2010 [employer] Patient Information 2012 [employer]. 

## 2011-08-23 NOTE — ED Provider Notes (Addendum)
History  This chart was scribed for Jonathan Baker, MD by Bennett Scrape. This patient was seen in room APA12/APA12 and the patient's care was started at 7:18AM.  CSN: [id number]  Arrival date & time 08/23/11  4098   First MD Initiated Contact with Patient 08/23/11 [phone]      Chief Complaint  Patient presents with  . Back Pain    The history is provided by the patient. No language interpreter was used.    Jonathan Gilmore is a 61 y.o. male with a h/o chronic back pain who presents to the Emergency Department complaining of one week of gradual onset, gradually worsening, constant lower back pain that radiates down the left leg. He states that the pain was so severe this morning after he woke up that he was unable to walk. He reports prior episodes of less severe pain. Pt states that he had neck surgery last year and was weaned off the pain medication. He denies currently being on pain medications. He denies new injury. Pt reports that oxycodone and hydrocodone have improved his symptoms in the past. He denies a change in bowel or bladder function. He denies any other symptoms. He also has a h/o HTN, sleep apnea and DM. He is a former smoker but denies alcohol use.   Past Medical History  Diagnosis Date  . Hypertension   . Sleep apnea     tested at sleep apnea at [facility]  . Diabetes mellitus     since 1993  . Back pain     Past Surgical History  Procedure Date  . Bone spur removed     back in 1997  . Hemorrhoid surgery   . Anterior cervical decomp/discectomy fusion 01/18/2011    Procedure: ANTERIOR CERVICAL DECOMPRESSION/DISCECTOMY FUSION 2 LEVELS;  Surgeon: Kathaleen Maser Pool;  Location: [facility];  Service: Neurosurgery;  Laterality: N/A;  anterior cervical discectomy and fusion with allograft and plating, cervical five-six, cervical six-seven    No family history on file.  History  Substance Use Topics  . Smoking status: Former Smoker -- 1.0 packs/day    Types:  Cigarettes    Quit date: 01/08/2011  . Smokeless tobacco: Former Neurosurgeon  . Alcohol Use: No      Review of Systems  Constitutional: Negative for fever.       10 Systems reviewed and are negative for acute change except as noted in the HPI.  HENT: Negative for rhinorrhea and neck pain.   Eyes: Negative for visual disturbance.  Respiratory: Negative for cough and shortness of breath.   Cardiovascular: Negative for chest pain.  Gastrointestinal: Negative for vomiting, abdominal pain and diarrhea.  Genitourinary: Negative for dysuria, frequency and hematuria.  Musculoskeletal: Positive for back pain.  Skin: Negative for rash.  Neurological: Negative for weakness, numbness and headaches.    Allergies  Morphine and related  Home Medications   Current Outpatient Rx  Name Route Sig Dispense Refill  . ASPIRIN EC 81 MG PO TBEC Oral Take 81 mg by mouth daily.     Marland Kitchen FLAXSEED OIL PO Oral Take 1 tablet by mouth daily.     Marland Kitchen HYDROCODONE-ACETAMINOPHEN 5-325 MG PO TABS Oral Take 1-2 tablets by mouth every 4 (four) hours as needed. 80 tablet 1  . INSULIN LISPRO (HUMAN) 100 UNIT/ML [place] SOLN Subcutaneous Inject 8 Units into the skin 3 (three) times daily before meals.     Marland Kitchen LACTULOSE 10 GM/15ML PO SOLN Oral Take 30 g by mouth  2 (two) times daily.      Marland Kitchen LISINOPRIL 20 MG PO TABS Oral Take 20 mg by mouth daily.      Marland Kitchen METFORMIN HCL 500 MG PO TABS Oral Take 500 mg by mouth 3 (three) times daily with meals.     . MULTI-VITAMIN/MINERALS PO TABS Oral Take 1 tablet by mouth daily.     . OXYCODONE-ACETAMINOPHEN 7.5-325 MG PO TABS Oral Take 1-2 tablets by mouth every 4 (four) hours as needed for pain. For pain 90 tablet 0  . PIOGLITAZONE HCL-METFORMIN HCL 15-850 MG PO TABS Oral Take 2 tablets by mouth 2 (two) times daily with a meal. 2 tabs with lunch and 2 tabs at bedtime    . TESTOSTERONE CYPIONATE 100 MG/ML IM OIL Intramuscular Inject 200 mg into the muscle every 14 (fourteen) days.       Triage Vitals:  BP 163/96  Pulse 76  Temp(Src) 98.2 F (36.8 C) (Oral)  Resp 18  Ht 5\' 6"  (1.676 m)  Wt 230 lb (104.327 kg)  BMI 37.12 kg/m2  SpO2 95%  Physical Exam  Nursing note and vitals reviewed. Constitutional: He is oriented to person, place, and time. He appears well-developed and well-nourished. No distress.  HENT:  Head: Normocephalic and atraumatic.  Eyes: Conjunctivae and EOM are normal.  Neck: Neck supple. No tracheal deviation present.  Cardiovascular: Normal rate.   Pulmonary/Chest: Effort normal. No respiratory distress.  Abdominal: He exhibits no distension.  Musculoskeletal: Normal range of motion.       Pain to palpation in the left lower paraspinal muscles, strength is normal  Neurological: He is alert and oriented to person, place, and time. No sensory deficit.       Trace patellar reflexes, normal straight leg reflexes, sensation is intact in the lower extremities  Skin: Skin is dry.  Psychiatric: He has a normal mood and affect. His behavior is normal.    ED Course  Procedures (including critical care time)  DIAGNOSTIC STUDIES: Oxygen Saturation is 95% on room air, adequate by my interpretation.    COORDINATION OF CARE: 7:28AM-Discussed treatment plan of pain medication and urinalysis with pt and pt agreed to plan. Advised pt that he will have to schedule a MRI as an outpatient due to lack of neurological findings. Pt states that he will follow up with Dr. Skip Estimable in [employer].     Labs Reviewed  URINALYSIS, ROUTINE W REFLEX MICROSCOPIC   No results found.   No diagnosis found.    MDM     I personally performed the services described in this documentation, which was scribed in my presence. The recorded information has been reviewed and considered.   9:47 AM Patient given pain medication feels better now. Repeat neurological exam remains stable. He will followup with his neurosurgeon. No concern for cauda equina or acute neurosurgical  condition      Jonathan Baker, MD 08/23/11 7829  Jonathan Baker, MD 08/23/11 [phone]

## 2011-08-23 NOTE — ED Notes (Signed)
Pt reports chronic lower back pain.  Pt says when woke up this am was unable to walk due to pain.  REports has 2 herniated discs in lower back.  Denies injury.

## 2011-09-08 ENCOUNTER — Other Ambulatory Visit: Payer: Self-pay | Admitting: Neurosurgery

## 2011-09-08 DIAGNOSIS — M542 Cervicalgia: Secondary | ICD-10-CM

## 2011-09-10 ENCOUNTER — Ambulatory Visit
Admission: RE | Admit: 2011-09-10 | Discharge: 2011-09-10 | Disposition: A | Discharge status: Home / Self Care | Payer: BC Managed Care – PPO | Source: Ambulatory Visit | Attending: Neurosurgery | Admitting: Neurosurgery

## 2011-09-10 DIAGNOSIS — M542 Cervicalgia: Secondary | ICD-10-CM

## 2012-01-17 ENCOUNTER — Other Ambulatory Visit: Payer: Self-pay | Admitting: Neurosurgery

## 2012-01-31 ENCOUNTER — Other Ambulatory Visit: Payer: Self-pay | Admitting: Neurosurgery

## 2012-02-29 ENCOUNTER — Other Ambulatory Visit: Payer: Self-pay | Admitting: Neurosurgery

## 2012-02-29 DIAGNOSIS — M542 Cervicalgia: Secondary | ICD-10-CM

## 2012-03-02 ENCOUNTER — Other Ambulatory Visit: Payer: Self-pay | Admitting: Neurosurgery

## 2012-03-02 DIAGNOSIS — M542 Cervicalgia: Secondary | ICD-10-CM

## 2012-03-04 ENCOUNTER — Other Ambulatory Visit: Payer: BC Managed Care – PPO

## 2012-03-06 ENCOUNTER — Other Ambulatory Visit: Payer: Self-pay | Admitting: Neurosurgery

## 2012-03-09 ENCOUNTER — Ambulatory Visit
Admission: RE | Admit: 2012-03-09 | Discharge: 2012-03-09 | Disposition: A | Discharge status: Home / Self Care | Payer: BC Managed Care – PPO | Source: Ambulatory Visit | Attending: Neurosurgery | Admitting: Neurosurgery

## 2012-03-09 DIAGNOSIS — M542 Cervicalgia: Secondary | ICD-10-CM

## 2012-04-03 ENCOUNTER — Other Ambulatory Visit: Payer: Self-pay | Admitting: Neurosurgery

## 2012-04-14 ENCOUNTER — Other Ambulatory Visit: Payer: Self-pay | Admitting: Neurosurgery

## 2012-06-12 ENCOUNTER — Encounter (INDEPENDENT_AMBULATORY_CARE_PROVIDER_SITE_OTHER): Payer: Self-pay

## 2012-09-16 IMAGING — RF DG CERVICAL SPINE 1V
1 series · 1 of 1 positions shown · non-contrast
Comparison: None

CLINICAL DATA: ACDF C5-C7.

DG SPINE PORTABLE - 1 VIEW

[Series 1: run · 1 of 1 slices shown]
[im 1/1]
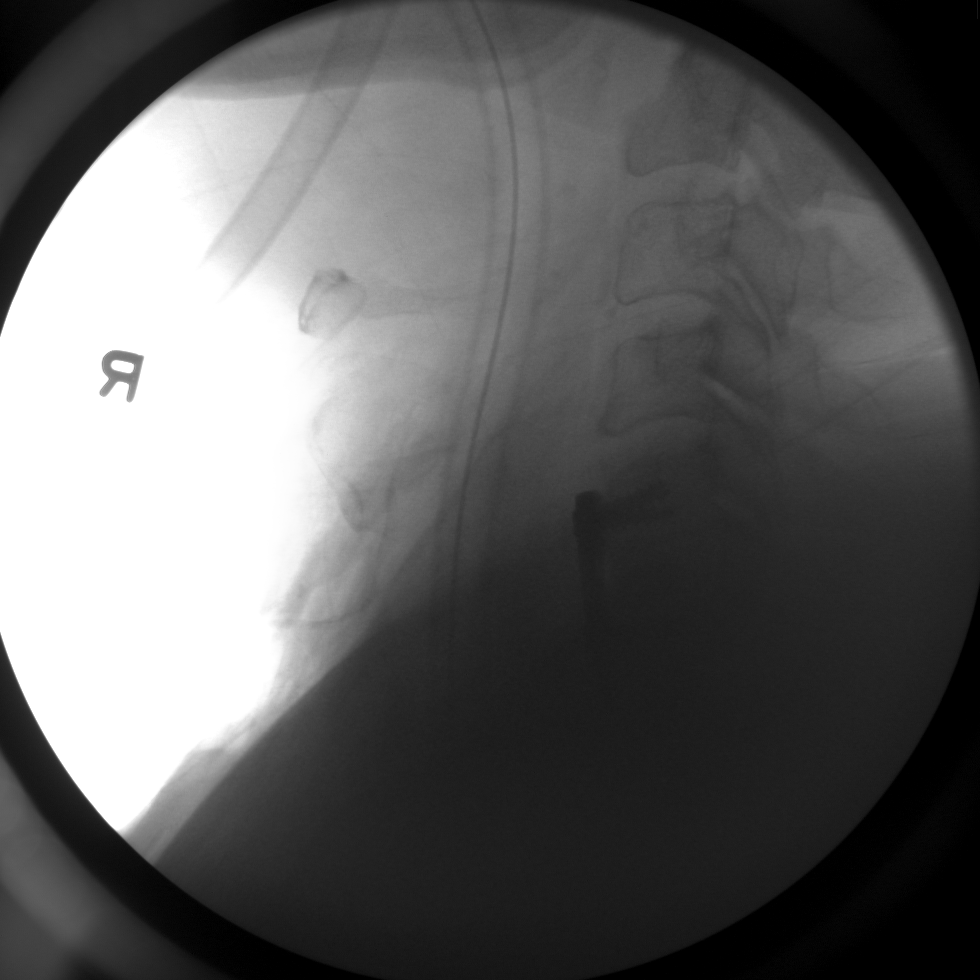

[1 of 1 positions shown; findings below may reference images not displayed]

FINDINGS: Single lateral intraoperative image demonstrates changes
of ACDF.  The superior aspect of the plate is noted at C5.  Cannot
visualize below the C5 level due to overlying shoulders.
IMPRESSION: ACDF, reportedly C5-C7.  Cannot visualize below the C5 level due to
overlying shoulders.

## 2012-09-20 ENCOUNTER — Other Ambulatory Visit: Payer: Self-pay | Admitting: Neurosurgery

## 2012-09-20 DIAGNOSIS — M542 Cervicalgia: Secondary | ICD-10-CM

## 2012-09-25 ENCOUNTER — Ambulatory Visit
Admission: RE | Admit: 2012-09-25 | Discharge: 2012-09-25 | Disposition: A | Discharge status: Home / Self Care | Payer: BC Managed Care – PPO | Source: Ambulatory Visit | Attending: Neurosurgery | Admitting: Neurosurgery

## 2012-09-25 DIAGNOSIS — M542 Cervicalgia: Secondary | ICD-10-CM

## 2013-02-24 ENCOUNTER — Encounter (HOSPITAL_COMMUNITY): Payer: Self-pay | Admitting: Emergency Medicine

## 2013-02-24 ENCOUNTER — Observation Stay (HOSPITAL_COMMUNITY)
Admission: EM | Admit: 2013-02-24 | Discharge: 2013-02-25 | Disposition: A | Discharge status: Home / Self Care | Payer: BC Managed Care – PPO | Attending: Family Medicine | Admitting: Family Medicine

## 2013-02-24 ENCOUNTER — Emergency Department (HOSPITAL_COMMUNITY): Payer: BC Managed Care – PPO

## 2013-02-24 DIAGNOSIS — E1100 Type 2 diabetes mellitus with hyperosmolarity without nonketotic hyperglycemic-hyperosmolar coma (NKHHC): Secondary | ICD-10-CM

## 2013-02-24 DIAGNOSIS — E1169 Type 2 diabetes mellitus with other specified complication: Secondary | ICD-10-CM

## 2013-02-24 DIAGNOSIS — R071 Chest pain on breathing: Principal | ICD-10-CM | POA: Insufficient documentation

## 2013-02-24 DIAGNOSIS — I700 Atherosclerosis of aorta: Secondary | ICD-10-CM | POA: Insufficient documentation

## 2013-02-24 DIAGNOSIS — I10 Essential (primary) hypertension: Secondary | ICD-10-CM

## 2013-02-24 DIAGNOSIS — I2510 Atherosclerotic heart disease of native coronary artery without angina pectoris: Secondary | ICD-10-CM

## 2013-02-24 DIAGNOSIS — R0781 Pleurodynia: Secondary | ICD-10-CM | POA: Diagnosis present

## 2013-02-24 DIAGNOSIS — E781 Pure hyperglyceridemia: Secondary | ICD-10-CM | POA: Insufficient documentation

## 2013-02-24 DIAGNOSIS — I319 Disease of pericardium, unspecified: Secondary | ICD-10-CM

## 2013-02-24 DIAGNOSIS — R079 Chest pain, unspecified: Secondary | ICD-10-CM

## 2013-02-24 DIAGNOSIS — R011 Cardiac murmur, unspecified: Secondary | ICD-10-CM

## 2013-02-24 DIAGNOSIS — E119 Type 2 diabetes mellitus without complications: Secondary | ICD-10-CM

## 2013-02-24 LAB — POCT I-STAT, CHEM 8
BUN: 10 mg/dL (ref 6–23)
Calcium, Ion: 1.2 mmol/L (ref 1.13–1.30)
Chloride: 99 mEq/L (ref 96–112)
Creatinine, Ser: 0.6 mg/dL (ref 0.50–1.35)
Glucose, Bld: 322 mg/dL — ABNORMAL HIGH (ref 70–99)
HCT: 49 % (ref 39.0–52.0)
Hemoglobin: 16.7 g/dL (ref 13.0–17.0)
Potassium: 3.8 mEq/L (ref 3.5–5.1)
Sodium: 139 mEq/L (ref 135–145)
TCO2: 25 mmol/L (ref 0–100)

## 2013-02-24 LAB — GLUCOSE, CAPILLARY: Glucose-Capillary: 276 mg/dL — ABNORMAL HIGH (ref 70–99)

## 2013-02-24 LAB — TROPONIN I
Troponin I: <0.3 ng/mL (ref <0.30)
Troponin I: <0.3 ng/mL (ref <0.30)

## 2013-02-24 MED ORDER — ENOXAPARIN SODIUM 120 MG/0.8ML ~~LOC~~ SOLN
1.0000 mg/kg | Freq: Once | SUBCUTANEOUS | Status: AC
  Administered 2013-02-24: 100 mg via SUBCUTANEOUS
  Filled 2013-02-24: qty 0.8

## 2013-02-24 MED ORDER — SODIUM CHLORIDE 0.9 % IJ SOLN: 3.0000 mL | Freq: Two times a day (BID) | INTRAMUSCULAR | Status: DC

## 2013-02-24 MED ORDER — ONDANSETRON HCL 4 MG PO TABS: 4.0000 mg | ORAL_TABLET | Freq: Four times a day (QID) | ORAL | Status: DC | PRN

## 2013-02-24 MED ORDER — ASPIRIN EC 81 MG PO TBEC
81.0000 mg | DELAYED_RELEASE_TABLET | Freq: Every day | ORAL | Status: DC
  Administered 2013-02-25: 81 mg via ORAL
  Filled 2013-02-24: qty 1

## 2013-02-24 MED ORDER — ACETAMINOPHEN 325 MG PO TABS: 650.0000 mg | ORAL_TABLET | Freq: Four times a day (QID) | ORAL | Status: DC | PRN

## 2013-02-24 MED ORDER — ASPIRIN 81 MG PO CHEW
324.0000 mg | CHEWABLE_TABLET | Freq: Once | ORAL | Status: AC
  Administered 2013-02-24: 324 mg via ORAL
  Filled 2013-02-24: qty 4

## 2013-02-24 MED ORDER — LORAZEPAM 0.5 MG PO TABS
0.5000 mg | ORAL_TABLET | Freq: Four times a day (QID) | ORAL | Status: DC | PRN
  Administered 2013-02-24: 0.5 mg via ORAL
  Filled 2013-02-24: qty 1

## 2013-02-24 MED ORDER — SODIUM CHLORIDE 0.9 % IJ SOLN
3.0000 mL | Freq: Two times a day (BID) | INTRAMUSCULAR | Status: DC
  Administered 2013-02-25: 3 mL via INTRAVENOUS

## 2013-02-24 MED ORDER — ACETAMINOPHEN 650 MG RE SUPP: 650.0000 mg | Freq: Four times a day (QID) | RECTAL | Status: DC | PRN

## 2013-02-24 MED ORDER — PANTOPRAZOLE SODIUM 40 MG PO TBEC
40.0000 mg | DELAYED_RELEASE_TABLET | Freq: Two times a day (BID) | ORAL | Status: DC
  Administered 2013-02-25: 40 mg via ORAL
  Filled 2013-02-24: qty 1

## 2013-02-24 MED ORDER — ONDANSETRON HCL 4 MG/2ML IJ SOLN: 4.0000 mg | Freq: Four times a day (QID) | INTRAMUSCULAR | Status: DC | PRN

## 2013-02-24 MED ORDER — IBUPROFEN 600 MG PO TABS
600.0000 mg | ORAL_TABLET | Freq: Three times a day (TID) | ORAL | Status: DC
  Administered 2013-02-24 – 2013-02-25 (×3): 600 mg via ORAL
  Filled 2013-02-24 (×3): qty 1

## 2013-02-24 MED ORDER — HYDROMORPHONE HCL PF 1 MG/ML IJ SOLN
1.0000 mg | INTRAMUSCULAR | Status: DC | PRN
  Administered 2013-02-24 – 2013-02-25 (×5): 1 mg via INTRAVENOUS
  Filled 2013-02-24 (×5): qty 1

## 2013-02-24 MED ORDER — ONDANSETRON HCL 4 MG/2ML IJ SOLN
4.0000 mg | Freq: Once | INTRAMUSCULAR | Status: AC
  Administered 2013-02-24: 4 mg via INTRAVENOUS

## 2013-02-24 MED ORDER — INSULIN ASPART 100 UNIT/ML ~~LOC~~ SOLN
0.0000 [IU] | Freq: Three times a day (TID) | SUBCUTANEOUS | Status: DC
Start: 2013-02-25 — End: 2013-02-24

## 2013-02-24 MED ORDER — NITROGLYCERIN 0.4 MG SL SUBL: 0.4000 mg | SUBLINGUAL_TABLET | SUBLINGUAL | Status: DC | PRN

## 2013-02-24 MED ORDER — POLYETHYLENE GLYCOL 3350 17 G PO PACK: 17.0000 g | PACK | Freq: Every day | ORAL | Status: DC | PRN

## 2013-02-24 MED ORDER — INSULIN ASPART 100 UNIT/ML ~~LOC~~ SOLN
0.0000 [IU] | Freq: Every day | SUBCUTANEOUS | Status: DC
  Administered 2013-02-24: 3 [IU] via SUBCUTANEOUS

## 2013-02-24 MED ORDER — INFLUENZA VAC SPLIT QUAD 0.5 ML IM SUSP
0.5000 mL | INTRAMUSCULAR | Status: AC
  Administered 2013-02-25: 0.5 mL via INTRAMUSCULAR
  Filled 2013-02-24: qty 0.5

## 2013-02-24 MED ORDER — ONDANSETRON HCL 4 MG/2ML IJ SOLN
4.0000 mg | Freq: Once | INTRAMUSCULAR | Status: DC
  Filled 2013-02-24: qty 2

## 2013-02-24 MED ORDER — SODIUM CHLORIDE 0.9 % IV BOLUS (SEPSIS)
1000.0000 mL | Freq: Once | INTRAVENOUS | Status: AC
  Administered 2013-02-24: 1000 mL via INTRAVENOUS

## 2013-02-24 MED ORDER — SODIUM CHLORIDE 0.9 % IV SOLN: 250.0000 mL | INTRAVENOUS | Status: DC | PRN

## 2013-02-24 MED ORDER — HYDROCODONE-ACETAMINOPHEN 10-325 MG PO TABS
1.0000 | ORAL_TABLET | Freq: Four times a day (QID) | ORAL | Status: DC | PRN
  Administered 2013-02-24 – 2013-02-25 (×2): 1 via ORAL
  Filled 2013-02-24 (×2): qty 1

## 2013-02-24 MED ORDER — KETOROLAC TROMETHAMINE 30 MG/ML IJ SOLN
30.0000 mg | Freq: Once | INTRAMUSCULAR | Status: AC
  Administered 2013-02-24: 30 mg via INTRAVENOUS
  Filled 2013-02-24: qty 1

## 2013-02-24 MED ORDER — HYDROMORPHONE HCL PF 1 MG/ML IJ SOLN
1.0000 mg | Freq: Once | INTRAMUSCULAR | Status: AC
  Administered 2013-02-24: 1 mg via INTRAVENOUS
  Filled 2013-02-24: qty 1

## 2013-02-24 MED ORDER — CALCIUM CARBONATE ANTACID 500 MG PO CHEW: 1.0000 | CHEWABLE_TABLET | Freq: Four times a day (QID) | ORAL | Status: DC | PRN

## 2013-02-24 MED ORDER — IOHEXOL 350 MG/ML SOLN
100.0000 mL | Freq: Once | INTRAVENOUS | Status: AC | PRN
  Administered 2013-02-24: 100 mL via INTRAVENOUS

## 2013-02-24 MED ORDER — COLCHICINE 0.6 MG PO TABS
0.6000 mg | ORAL_TABLET | Freq: Two times a day (BID) | ORAL | Status: DC
  Administered 2013-02-24 – 2013-02-25 (×2): 0.6 mg via ORAL
  Filled 2013-02-24 (×2): qty 1

## 2013-02-24 MED ORDER — SODIUM CHLORIDE 0.9 % IJ SOLN: 3.0000 mL | INTRAMUSCULAR | Status: DC | PRN

## 2013-02-24 MED ORDER — ATORVASTATIN CALCIUM 20 MG PO TABS
20.0000 mg | ORAL_TABLET | Freq: Every day | ORAL | Status: DC
  Administered 2013-02-24 – 2013-02-25 (×2): 20 mg via ORAL
  Filled 2013-02-24 (×2): qty 1

## 2013-02-24 MED ORDER — INSULIN ASPART 100 UNIT/ML ~~LOC~~ SOLN
0.0000 [IU] | Freq: Three times a day (TID) | SUBCUTANEOUS | Status: DC
  Administered 2013-02-24: 7 [IU] via SUBCUTANEOUS
  Administered 2013-02-25: 5 [IU] via SUBCUTANEOUS
  Administered 2013-02-25: 3 [IU] via SUBCUTANEOUS

## 2013-02-24 MED ORDER — PNEUMOCOCCAL VAC POLYVALENT 25 MCG/0.5ML IJ INJ
0.5000 mL | INJECTION | INTRAMUSCULAR | Status: AC
  Administered 2013-02-25: 0.5 mL via INTRAMUSCULAR
  Filled 2013-02-24: qty 0.5

## 2013-02-24 MED ORDER — ENOXAPARIN SODIUM 120 MG/0.8ML ~~LOC~~ SOLN
SUBCUTANEOUS | Status: AC
  Filled 2013-02-24: qty 0.8

## 2013-02-24 NOTE — ED Provider Notes (Signed)
CSN: [id number]     Arrival date & time 02/24/13  1217 History  This chart was scribed for Raeford Razor, MD by Luisa Dago, ED Scribe and Bennett Scrape, ED Scribe. This patient was seen in room APA02/APA02 and the patient's care was started at 12:39 PM.     Chief Complaint  Patient presents with  . Flank Pain  . Shoulder Pain  . Chest Pain  . Sore Throat    The history is provided by the patient. No language interpreter was used.   HPI Comments: Jonathan Gilmore is a 62 y.o. male who presents to the Emergency Department complaining of left sided mid back pain that radiates over to his left shoulder that started this morning. He was seen at the Urgent Care this morning for the same. He had a CXR that showed PNA and a d-dimer test that came back "elevated." No specific value located on paperwork he brought with him.  He was then advised to come to the ED. Pt denies SOB, leg swelling or leg pain. He denies having a h/o PE/DVT or any recent surgeries or long trips. Pt states that he has a history of diabetes and hypertension.   Past Medical History  Diagnosis Date  . Hypertension   . Sleep apnea     tested at sleep apnea at [facility]  . Diabetes mellitus     since 1993  . Back pain    Past Surgical History  Procedure Laterality Date  . Bone spur removed      back in 1997  . Hemorrhoid surgery    . Anterior cervical decomp/discectomy fusion  01/18/2011    Procedure: ANTERIOR CERVICAL DECOMPRESSION/DISCECTOMY FUSION 2 LEVELS;  Surgeon: Kathaleen Maser Pool;  Location: [facility];  Service: Neurosurgery;  Laterality: N/A;  anterior cervical discectomy and fusion with allograft and plating, cervical five-six, cervical six-seven   Family History  Problem Relation Age of Onset  . Cancer Mother   . Diabetes Father   . Diabetes Sister   . Cushing syndrome Sister    History  Substance Use Topics  . Smoking status: Current Every Day Smoker -- 1.00 packs/day for 50 years    Types:  Cigarettes  . Smokeless tobacco: Former Neurosurgeon  . Alcohol Use: No    Review of Systems  Respiratory: Positive for cough. Negative for shortness of breath.   Cardiovascular: Positive for chest pain.  Musculoskeletal: Positive for back pain. Negative for myalgias.  All other systems reviewed and are negative.    Allergies  Morphine and related  Home Medications   Current Outpatient Rx  Name  Route  Sig  Dispense  Refill  . metFORMIN (GLUCOPHAGE) 500 MG tablet   Oral   Take 500 mg by mouth 3 (three) times daily with meals.          Marland Kitchen aspirin EC 81 MG tablet   Oral   Take 81 mg by mouth daily.          . Cholecalciferol (VITAMIN D PO)   Oral   Take 1 tablet by mouth daily.         . Flaxseed, Linseed, (FLAXSEED OIL PO)   Oral   Take 1 tablet by mouth daily.          . Multiple Vitamin (MULTIVITAMIN WITH MINERALS) TABS   Oral   Take 1 tablet by mouth daily.         . predniSONE (DELTASONE) 10 MG tablet  Oral   Take 2 tablets (20 mg total) by mouth daily.   10 tablet   0   . testosterone cypionate (DEPOTESTOTERONE CYPIONATE) 100 MG/ML injection   Intramuscular   Inject 200 mg into the muscle every 14 (fourteen) days.           Triage Vital:BP 149/94  Pulse 99  Temp(Src) 97.6 F (36.4 C) (Oral)  Resp 14  Ht 5\' 7"  (1.702 m)  Wt 226 lb (102.513 kg)  BMI 35.39 kg/m2  SpO2 97%  Physical Exam  Nursing note and vitals reviewed. Constitutional: He is oriented to person, place, and time. He appears well-developed and well-nourished. No distress.  HENT:  Head: Normocephalic and atraumatic.  Eyes: Conjunctivae are normal. Right eye exhibits no discharge. Left eye exhibits no discharge.  Neck: Neck supple.  Cardiovascular: Normal rate, regular rhythm and normal heart sounds.  Exam reveals no gallop and no friction rub.   No murmur heard. Pulmonary/Chest: Effort normal. No respiratory distress. He has rhonchi.  LLL rhonchi.  Abdominal: Soft. He exhibits  no distension. There is no tenderness.  Musculoskeletal: He exhibits no edema and no tenderness.  Lower extremities symmetric as compared to each other. No calf tenderness. Negative Homan's. No palpable cords.   Neurological: He is alert and oriented to person, place, and time.  Skin: Skin is warm and dry.  Psychiatric: He has a normal mood and affect. His behavior is normal. Thought content normal.    ED Course  Procedures (including critical care time)  DIAGNOSTIC STUDIES: Oxygen Saturation is 97% on room air, adequate by my interpretation.    COORDINATION OF CARE: 12:47 PM-Discussed treatment plan which includes CT scan with pt at bedside and pt agreed to plan.     Labs Review Labs Reviewed  POCT I-STAT, CHEM 8 - Abnormal; Notable for the following:    Glucose, Bld 322 (*)    All other components within normal limits   Imaging Review Ct Angio Chest W/cm &/or Wo Cm  02/24/2013   CLINICAL DATA:  Pleuritic left-sided chest pain. Shortness of breath. Hyperglycemia. Elevated D-dimer. Rhonchi to the left lower lobe on physical examination.  EXAM: CT ANGIOGRAPHY CHEST WITH CONTRAST  TECHNIQUE: Multidetector CT imaging of the chest was performed using the standard protocol during bolus administration of intravenous contrast. Multiplanar CT image reconstructions including MIPs were obtained to evaluate the vascular anatomy.  CONTRAST:  OMNIPAQUE IOHEXOL 350 MG/ML IV.  COMPARISON:  None.  FINDINGS: Contrast opacification of the pulmonary arteries is very good. No filling defects within either main pulmonary artery or their branches in either lung to suggest pulmonary embolism. Heart size upper normal to borderline enlarged with mild left ventricular hypertrophy. Three vessel coronary atherosclerosis, severe involvement of the LAD. Very small pericardial effusion, localizing to the superior recess. Moderate atherosclerosis involving the thoracic and visualized upper abdominal aorta without  aneurysm or dissection. Atherosclerosis at the origins of the great vessels without significant stenosis.  Pulmonary parenchyma clear without localized airspace consolidation, interstitial disease, or parenchymal nodules or masses. No pleural effusions. Central airways patent with moderate to marked bronchial wall thickening. Scattered areas of hyperlucency in both lungs consistent with localized air trapping.  No significant mediastinal, hilar, or axillary lymphadenopathy. Visualized thyroid gland unremarkable. Fluid-filled, mildly dilated esophagus with borderline diffuse wall thickening. No visible esophageal mass. No evidence of hiatal hernia.  Visualized upper abdomen unremarkable for the early arterial phase of enhancement. Bone window images demonstrate diffuse thoracic spondylosis and DISH.  Review of the MIP images confirms the above findings.  IMPRESSION: 1. No evidence of pulmonary embolism. 2. Mild cardiomegaly with left ventricular hypertrophy. Very small pericardial effusion in the superior recess. Three vessel coronary atherosclerosis, severe involvement of the LAD. 3. Mildly dilated, fluid-filled esophagus with borderline diffuse wall thickening and no evidence of hiatal hernia. Query dysmotility and mild diffuse esophagitis. Does the patient have symptoms of esophagitis? 4. Moderate to marked bronchial wall thickening centrally and scattered areas of localized air trapping are consistent with asthma and/or bronchitis. No acute cardiopulmonary disease otherwise.   Electronically Signed   By: Hulan Saas M.D.   On: 02/24/2013 14:39    EKG Interpretation    Date/Time:  Saturday February 24 2013 12:18:51 EST Ventricular Rate:  99 PR Interval:  164 QRS Duration: 84 QT Interval:  372 QTC Calculation: 477 R Axis:   52 Text Interpretation:  Normal sinus rhythm Nonspecific ST and T wave abnormality Prolonged QT Confirmed by Juleen China  MD, Lainey Nelson ([id number]) on 02/24/2013 2:13:59 PM             MDM   1. Pleuritic pain   2. CAD (coronary artery disease)      62yM with L upper back pain which is pleuritic in nature. No evidence of PE or pneumonia on CT angiography. Does note CAD though including severe LAD disease. Symptoms very atypical for ACS. Probably prudent to admit for r/o though. EKG does have new changes in leads you'd expect for LAD disease although these are nonspecific. He endorses L shoulder pain and tingling in LUE but reports no change in chronic symptoms he attributes to rotator cuff. ASA given. Initial troponin normal. PCP Dr Rolly Pancake.   I personally preformed the services scribed in my presence. The recorded information has been reviewed is accurate. Raeford Razor, MD.     Raeford Razor, MD 02/24/13 [phone]

## 2013-02-24 NOTE — ED Notes (Signed)
Report given to Cindy, RN. Ready to receive patient. 

## 2013-02-24 NOTE — ED Notes (Signed)
Per patient went to urgent care for pain in left side, left shoulder, and sore throat. Patient had EKG and blood work done. Blood work showed elevated d-dimer was sent to ER for Ct to evaluate for PE. Patient reports chills last night but states he did not have pain till this morning. Denies any cardiac hx.

## 2013-02-24 NOTE — H&P (Signed)
History and Physical  CELESTE CANDELAS UJW:[id number] DOB: [birth year] DOA: 02/24/2013  Referring physician: Dr. Marylen Ponto in [place] PCP: No PCP Per Patient   Chief Complaint: Back pain  HPI:  62 year old man who presents the emergency department complaining of left-sided mid back pain radiating to left shoulder. He was in urgent care where chest x-ray suggested pneumonia and d-dimer was positive and this patient was referred to the emergency department with initial evaluation as below. Observation was requested for cardiac rule out.  History obtained from patient. No previous history of heart disease known. He works in Aeronautical engineer but has not previously had any exertional chest pain or shortness of breath. This morning when he woke up at approximately 8 AM he noticed sharp stabbing back pain on the left mid back radiating around his rib cage to the chest. Worsened with deep inspiration. Severe in nature. Improved with Dilaudid in the emergency department. He has chronic left shoulder pain and numbness in his fingers which is unchanged. Pain persisted, he was seen in urgent care as above. I have reviewed documentation. EKG shows sinus rhythm with no acute changes. WBC was 12.3. D-dimer was reported "positive" without a number.  In the emergency department noted to be afebrile stable vital signs. No hypoxia. Basic metabolic panel, hemoglobin, troponin unremarkable. EKG nonacute. CT angiogram of the chest negative for PE. Mild cardiomegaly with LVH was seen. Small pericardial effusion seen. Three-vessel coronary artery disease with severe involvement of the LAD. Other findings as listed below.  Review of Systems:  Negative for fever, visual changes, sore throat, rash, SOB, dysuria, bleeding, n/v/abdominal pain.  Past Medical History  Diagnosis Date  . Hypertension   . Sleep apnea     [facility]  . Diabetes mellitus     since 1993  . Back pain     Past Surgical History   Procedure Laterality Date  . Bone spur removed      back in 1997  . Hemorrhoid surgery    . Anterior cervical decomp/discectomy fusion  01/18/2011    Procedure: ANTERIOR CERVICAL DECOMPRESSION/DISCECTOMY FUSION 2 LEVELS;  Surgeon: Kathaleen Maser Pool;  Location: [facility];  Service: Neurosurgery;  Laterality: N/A;  anterior cervical discectomy and fusion with allograft and plating, cervical five-six, cervical six-seven    Social History:  reports that he has been smoking Cigarettes.  He has a 50 pack-year smoking history. He has quit using smokeless tobacco. He reports that he does not drink alcohol or use illicit drugs.  Allergies  Allergen Reactions  . Morphine And Related Nausea And Vomiting    Family History  Problem Relation Age of Onset  . Cancer Mother   . Diabetes Father   . Diabetes Sister   . Cushing syndrome Sister      Prior to Admission medications   Medication Sig Start Date End Date Taking? Authorizing Provider  aspirin EC 81 MG tablet Take 81 mg by mouth daily.    Yes Historical Provider, MD  atorvastatin (LIPITOR) 20 MG tablet Take 1 tablet by mouth daily. 01/12/13  Yes Historical Provider, MD  benzonatate (TESSALON) 100 MG capsule Take 100 mg by mouth 3 (three) times daily as needed for cough.   Yes Historical Provider, MD  Flaxseed, Linseed, (FLAXSEED OIL PO) Take 1 tablet by mouth daily.    Yes Historical Provider, MD  GABAPENTIN PO Take 1 capsule by mouth 3 (three) times daily.   Yes Historical Provider, MD  HYDROcodone-acetaminophen (  NORCO) 10-325 MG per tablet Take 2 tablets by mouth 5 (five) times daily.   Yes Historical Provider, MD  levofloxacin (LEVAQUIN) 750 MG tablet Take 750 mg by mouth daily. Starting 02/24/2013 x 7 days.   Yes Historical Provider, MD  metFORMIN (GLUCOPHAGE) 500 MG tablet Take 500 mg by mouth 3 (three) times daily with meals.    Yes Historical Provider, MD  Multiple Vitamin (MULTIVITAMIN WITH MINERALS) TABS Take 1 tablet by mouth daily.    Yes Historical Provider, MD   Physical Exam: Filed Vitals:   02/24/13 1357 02/24/13 1400 02/24/13 1445 02/24/13 1500  BP: 147/79 149/81  142/84  Pulse: 96 95 92   Temp: 97.9 F (36.6 C)     TempSrc: Oral     Resp: 20 17  18   Height:      Weight:      SpO2: 96% 94% 95%     General: Examined in the emergency department.  Appears calm and comfortable Eyes: PERRL, normal lids, irises  ENT: grossly normal hearing, lips & tongue Neck: no LAD, masses or thyromegaly Cardiovascular: RRR, 2/6 holosystolic murmur, best heard right upper sternal border. No LE edema. Respiratory: CTA bilaterally, no w/r/r. Normal respiratory effort. Abdomen: soft, ntnd Skin: no rash or induration seen Musculoskeletal: grossly normal tone BUE/BLE Psychiatric: grossly normal mood and affect, speech fluent and appropriate Neurologic: grossly non-focal.  Wt Readings from Last 3 Encounters:  02/24/13 102.513 kg (226 lb)  08/23/11 104.327 kg (230 lb)  01/18/11 110.678 kg (244 lb)    Labs on Admission:  Basic Metabolic Panel:  Recent Labs Lab 02/24/13 1334  NA 139  K 3.8  CL 99  GLUCOSE 322*  BUN 10  CREATININE 0.60    CBC:  Recent Labs Lab 02/24/13 1334  HGB 16.7  HCT 49.0    Cardiac Enzymes:  Recent Labs Lab 02/24/13 1230  TROPONINI <0.30    Radiological Exams on Admission: Ct Angio Chest W/cm &/or Wo Cm  02/24/2013   CLINICAL DATA:  Pleuritic left-sided chest pain. Shortness of breath. Hyperglycemia. Elevated D-dimer. Rhonchi to the left lower lobe on physical examination.  EXAM: CT ANGIOGRAPHY CHEST WITH CONTRAST  TECHNIQUE: Multidetector CT imaging of the chest was performed using the standard protocol during bolus administration of intravenous contrast. Multiplanar CT image reconstructions including MIPs were obtained to evaluate the vascular anatomy.  CONTRAST:  OMNIPAQUE IOHEXOL 350 MG/ML IV.  COMPARISON:  None.  FINDINGS: Contrast opacification of the pulmonary arteries  is very good. No filling defects within either main pulmonary artery or their branches in either lung to suggest pulmonary embolism. Heart size upper normal to borderline enlarged with mild left ventricular hypertrophy. Three vessel coronary atherosclerosis, severe involvement of the LAD. Very small pericardial effusion, localizing to the superior recess. Moderate atherosclerosis involving the thoracic and visualized upper abdominal aorta without aneurysm or dissection. Atherosclerosis at the origins of the great vessels without significant stenosis.  Pulmonary parenchyma clear without localized airspace consolidation, interstitial disease, or parenchymal nodules or masses. No pleural effusions. Central airways patent with moderate to marked bronchial wall thickening. Scattered areas of hyperlucency in both lungs consistent with localized air trapping.  No significant mediastinal, hilar, or axillary lymphadenopathy. Visualized thyroid gland unremarkable. Fluid-filled, mildly dilated esophagus with borderline diffuse wall thickening. No visible esophageal mass. No evidence of hiatal hernia.  Visualized upper abdomen unremarkable for the early arterial phase of enhancement. Bone window images demonstrate diffuse thoracic spondylosis and DISH.  Review of the MIP images  confirms the above findings.  IMPRESSION: 1. No evidence of pulmonary embolism. 2. Mild cardiomegaly with left ventricular hypertrophy. Very small pericardial effusion in the superior recess. Three vessel coronary atherosclerosis, severe involvement of the LAD. 3. Mildly dilated, fluid-filled esophagus with borderline diffuse wall thickening and no evidence of hiatal hernia. Query dysmotility and mild diffuse esophagitis. Does the patient have symptoms of esophagitis? 4. Moderate to marked bronchial wall thickening centrally and scattered areas of localized air trapping are consistent with asthma and/or bronchitis. No acute cardiopulmonary disease  otherwise.   Electronically Signed   By: Hulan Saas M.D.   On: 02/24/2013 14:39    EKG: Independently reviewed. Normal sinus rhythm. No acute changes.   Principal Problem:   Chest pain Active Problems:   Pleuritic pain   Murmur   Pericarditis   DM type 2 (diabetes mellitus, type 2)   HTN (hypertension)   Assessment/Plan 1. Pleuritic chest pain: Suspect developing pericarditis by symptomology, small pericardial effusion, leukocytosis. Another consideration would be esophagitis but this seems less likely. Although he does have three-vessel coronary artery disease by CT he has had no recent exertional symptoms and he remains fairly active and I do not think ACS is highly likely at this point with a negative troponin 4 hours after onset of pain and unremarkable EKG. Will give a one-time dose of therapeutic Lovenox until ruled out. He has no history of bleeding. 2. Murmur 3. Hypertension 4. Diabetes mellitus hyperlipidemia: Continue Lipitor 5. Obstructive sleep apnea: Does not wear CPAP  6. Abnormal CT findings including:  1. mild cardiomegaly with LVH.  2. Small pericardial effusion.  3. 3 vessel coronary artery disease with severe involvement of the LAD.  4. Fluid-filled esophagus, consider esophagitis 5. Bronchial wall thickening consistent with asthma or bronchitis 6. Atherosclerosis of the thoracic aorta   Empiric anti-inflammatories, colchicine. Serial troponins. Aspirin. EKG in the morning. 2-D echocardiogram in the morning.  He will need outpatient cardiology followup for above findings  Empiric Protonix, TUMS as needed  Sliding scale insulin. Resume metformin on discharge.   Antihypertensives  I reviewed all laboratory and clinical findings as well as imaging findings with the patient and his wife. I discussed in detail the above findings and my clinical suspicions and the treatment plan.   Code Status: full code  DVT prophylaxis: Lovenox Family Communication:  As above Disposition Plan/Anticipated LOS: obs, <24 hours  Time spent: 60 minutes  Brendia Sacks, MD  [facility] Pager [phone] 02/24/2013, 3:47 PM

## 2013-02-24 NOTE — ED Notes (Signed)
Lying in bed. No distress.

## 2013-02-24 NOTE — ED Notes (Signed)
Chem 8 initiated.

## 2013-02-25 DIAGNOSIS — I10 Essential (primary) hypertension: Secondary | ICD-10-CM

## 2013-02-25 DIAGNOSIS — I359 Nonrheumatic aortic valve disorder, unspecified: Secondary | ICD-10-CM

## 2013-02-25 LAB — LIPID PANEL
HDL: 27 mg/dL — ABNORMAL LOW (ref >39)
LDL Cholesterol: UNDETERMINED mg/dL (ref 0–99)
Total CHOL/HDL Ratio: 4.9 RATIO
VLDL: UNDETERMINED mg/dL (ref 0–40)

## 2013-02-25 LAB — BASIC METABOLIC PANEL
BUN: 15 mg/dL (ref 6–23)
CO2: 25 mEq/L (ref 19–32)
Calcium: 8.6 mg/dL (ref 8.4–10.5)
Creatinine, Ser: 0.62 mg/dL (ref 0.50–1.35)
GFR calc Af Amer: >90 mL/min (ref >90)
GFR calc non Af Amer: >90 mL/min (ref >90)
Glucose, Bld: 253 mg/dL — ABNORMAL HIGH (ref 70–99)
Potassium: 4.1 mEq/L (ref 3.5–5.1)

## 2013-02-25 LAB — CBC
Hemoglobin: 14.5 g/dL (ref 13.0–17.0)
MCH: 31.9 pg (ref 26.0–34.0)
MCHC: 34.1 g/dL (ref 30.0–36.0)
MCV: 93.4 fL (ref 78.0–100.0)
Platelets: 224 10*3/uL (ref 150–400)
RDW: 13 % (ref 11.5–15.5)

## 2013-02-25 LAB — GLUCOSE, CAPILLARY: Glucose-Capillary: 212 mg/dL — ABNORMAL HIGH (ref 70–99)

## 2013-02-25 LAB — TROPONIN I: Troponin I: <0.3 ng/mL (ref <0.30)

## 2013-02-25 MED ORDER — PANTOPRAZOLE SODIUM 40 MG PO TBEC: 40.0000 mg | DELAYED_RELEASE_TABLET | Freq: Every day | ORAL | Status: DC

## 2013-02-25 MED ORDER — COLCHICINE 0.6 MG PO TABS: 0.6000 mg | ORAL_TABLET | Freq: Two times a day (BID) | ORAL | Status: DC

## 2013-02-25 MED ORDER — IBUPROFEN 600 MG PO TABS: 600.0000 mg | ORAL_TABLET | Freq: Three times a day (TID) | ORAL | Status: DC

## 2013-02-25 MED ORDER — NITROGLYCERIN 0.4 MG SL SUBL: 0.4000 mg | SUBLINGUAL_TABLET | SUBLINGUAL | Status: DC | PRN

## 2013-02-25 MED ORDER — LISINOPRIL 10 MG PO TABS: 10.0000 mg | ORAL_TABLET | Freq: Every day | ORAL | Status: DC

## 2013-02-25 NOTE — Plan of Care (Signed)
Problem: Discharge Progression Outcomes Goal: No anginal pain Outcome: Completed/Met Date Met:  02/25/13 Denies pain Goal: Other Discharge Outcomes/Goals Outcome: Completed/Met Date Met:  02/25/13 Education on chest pain Hypertension and hyperglycemia

## 2013-02-25 NOTE — Progress Notes (Signed)
Patient c/o pain, still in chest radiating to back and side.  Patient appears uncomfortable.  Encouraged patient to take nitro, patient says, he won't take nitro because it " blows his head off".  Patient agreed to wear oxygen for comfort.  Notified mid-level practitioner on call, and told her of patient's discomfort and c/o of pain.  Received no new orders at this time. Patient is on anti-inflammatories and pain medicine.  Meds have been dispensed as ordered.   Patient is cooperative with staff but appears anxious but stoic about pain.  Encouraged patient to notify nurse if any changes or increase of any symptoms.  Will continue to monitor patient.

## 2013-02-25 NOTE — Progress Notes (Addendum)
[facility] PROGRESS NOTE  Jonathan Gilmore ZOX:[id number] DOB: 1951-01-14 DOA: 02/24/2013 PCP: No PCP Per Patient  Assessment/Plan: 1. Pleuritic chest pain: Improved. Cardiac enzymes negative. EKG without acute changes. Taken together symptomology and clinical findings argue against ACS. Suspect pericarditis given symptomology, small pericardial effusion on CT and leukocytosis which has not resolved. Esophagitis in the differential. CT finding of 3 vessel coronary artery disease noted but patient has no exertional symptoms. 2. Murmur: Followup echo 3. Hypertension: Stable. 4. Diabetes mellitus: Stable. 5. Hyperlipidemia: Stable. Continue Lipitor.  6. Hypertriglyceridemia 7. Abnormal CT findings including:  1. mild cardiomegaly with LVH.  2. Small pericardial effusion.  3. 3 vessel coronary artery disease with severe involvement of the LAD.  4. Fluid-filled esophagus, consider esophagitis 5. Bronchial wall thickening consistent with asthma or bronchitis 6. Atherosclerosis of the thoracic aorta   Patient feels much better and wants to go home.  Continue empiric anti-inflammatories, colchicine, aspirin.  Followup 2-D echocardiogram  Will certainly need close outpatient cardiology followup for above CT findings  Continue empiric Protonix, TUMS as needed  Possible discharge later today if continues to improve and 2-D echocardiogram reassuring  Pending studies:   Lipid  Code Status: full code DVT prophylaxis: Lovenox Family Communication:  Disposition Plan:   Brendia Sacks, MD  [facility]  Pager [phone] If 7PM-7AM, please contact night-coverage at [email or web address], password [facility] 02/25/2013, 10:38 AM  LOS: 1 day   Summary: 62 year old man who presents the emergency department complaining of left-sided mid back pain radiating to left shoulder. He was in urgent care where chest x-ray suggested pneumonia and d-dimer was positive and this patient was referred to the  emergency department with initial evaluation as below. Observation was requested for cardiac rule out.  Consultants:    Procedures:  2-D echocardiogram:   HPI/Subjective: Overall feels better today. Less pain, still pleuritic. Breathing fine. Refuse nitroglycerin secondary to headache. No difficulty breathing. Feels well enough to go home.  Objective: Filed Vitals:   02/24/13 1600 02/24/13 1738 02/24/13 1925 02/25/13 0522  BP: 149/85 164/91 147/75 144/80  Pulse:  81 92 94  Temp:  98 F (36.7 C) 98.9 F (37.2 C) 98.6 F (37 C)  TempSrc:  Oral Oral Oral  Resp: 21 20 20 20   Height:  5\' 7"  (1.702 m)    Weight:  104 kg (229 lb 4.5 oz)    SpO2:  96% 91% 96%   No intake or output data in the 24 hours ending 02/25/13 1038   Filed Weights   02/24/13 1228 02/24/13 1738  Weight: 102.513 kg (226 lb) 104 kg (229 lb 4.5 oz)    Exam:   Afebrile, vital signs stable  General: Appears calm and comfortable. Speech fluent and clear.  Cardiovascular: Regular rate and rhythm. 2/6 holosystolic murmur right upper sternal border, no rub or gallop.   Respiratory: Clear to auscultation bilaterally. No wheezes, rales or rhonchi. Normal respiratory effort.  Psychiatric: Grossly normal mood and affect. Speech fluent and appropriate.  Data Reviewed:  Basic metabolic panel notable for mild hypernatremia, 132.  Troponins negative  CBC normal  EKG this a.m.: Normal sinus rhythm, nonspecific ST changes, left atrial enlargement. No acute changes.   Scheduled Meds: . aspirin EC  81 mg Oral Daily  . atorvastatin  20 mg Oral Daily  . colchicine  0.6 mg Oral BID  . ibuprofen  600 mg Oral TID  . influenza vac split quadrivalent PF  0.5 mL Intramuscular Tomorrow-1000  . insulin aspart  0-5 Units Subcutaneous QHS  . insulin aspart  0-9 Units Subcutaneous TID WC  . pantoprazole  40 mg Oral BID AC  . pneumococcal 23 valent vaccine  0.5 mL Intramuscular Tomorrow-1000  . sodium chloride  3 mL  Intravenous Q12H  . sodium chloride  3 mL Intravenous Q12H   Continuous Infusions:   Principal Problem:   Chest pain Active Problems:   Pleuritic pain   Murmur   Pericarditis   DM type 2 (diabetes mellitus, type 2)   HTN (hypertension)

## 2013-02-25 NOTE — Progress Notes (Signed)
Utilization Review completed.  

## 2013-02-25 NOTE — Discharge Summary (Signed)
Physician Discharge Summary  ISRRAEL FLUCKIGER WUJ:[id number] DOB: 03-09-1951 DOA: 02/24/2013  PCP: Samuel Jester, DO  Admit date: 02/24/2013 Discharge date: 02/25/2013  Recommendations for Outpatient Follow-up:  1. Followup presumed viral pericarditis 2. Followup hypertriglyceridemia 3. Followup new finding of three-vessel coronary artery disease on CT chest with severe involvement of the LAD 4. Followup abnormal appearance of the esophagus on CT, consider outpatient GI referral 5. Followup hypertension, lisinopril started, consider repeat basic metabolic only   Follow-up Information   Schedule an appointment as soon as possible for a visit with [name], [name], DO.   Contact information:   [address] [place] [address] [phone]       Follow up with Antoine Poche, MD. (office will call you for appointment)    Specialty:  Cardiology   Contact information:   [address] [place] [address] [phone]      Discharge Diagnoses:  1. Pleuritic chest pain 2. Presumed acute viral pericarditis 3. 3 vessel coronary artery disease with severe involvement of the LAD by CT chest 4. Abnormal appearance of the esophagus on CT 5. Atherosclerosis of the thoracic aorta 6. Diabetes mellitus type 2 7. Hypertension 8. Hypertriglyceridemia  Discharge Condition: Improved Disposition: Home  Diet recommendation: Heart healthy diabetic diet  Filed Weights   02/24/13 1228 02/24/13 1738  Weight: 102.513 kg (226 lb) 104 kg (229 lb 4.5 oz)    History of present illness:  62 year old man who presents the emergency department complaining of left-sided mid back pain radiating to left shoulder. He was in urgent care where chest x-ray suggested pneumonia and d-dimer was positive and this patient was referred to the emergency department with initial evaluation as below. Observation was requested for cardiac rule out.  Hospital Course:  Mr. Banks was observed overnight.  Troponins were negative, telemetry unremarkable, CT angiogram of chest negative for PE. EKG non acute. On day of discharge chest pain was resolved without recurrence. 2-D echocardiogram reassuring as below with no evidence of pericardial effusion. Constellation of signs and symptoms most suggestive of pericarditis. Symptoms resolved on discharge. Close outpatient followup with cardiology for incidental finding of 3 vessel coronary artery disease. Individual issues as below.  1. Pleuritic chest pain: Improved. Cardiac enzymes negative. EKG without acute changes. Taken together symptomology and clinical findings argue against ACS. Suspect pericarditis given symptomology, small pericardial effusion on CT and leukocytosis which has resolved. Esophagitis in the differential but patient asymptomatic. CT finding of 3 vessel coronary artery disease noted but patient has no exertional symptoms. 2. Hypertension: Stable. 3. Diabetes mellitus: Stable. 4. Hyperlipidemia: Stable. Continue Lipitor.  5. Hypertriglyceridemia 6. Abnormal CT findings including:  1. mild cardiomegaly with LVH.  2. Small pericardial effusion.  3. 3 vessel coronary artery disease with severe involvement of the LAD.  4. Fluid-filled esophagus, consider esophagitis 5. Bronchial wall thickening consistent with asthma or bronchitis 6. Atherosclerosis of the thoracic aorta  Consultants: none Procedures:  2-D echocardiogram: Left ventricular ejection fraction normal 55-60%. Normal wall motion. Grade 1 diastolic dysfunction.  Discharge Instructions  Discharge Orders   Future Orders Complete By Expires   Activity as tolerated - No restrictions  As directed    Diet - low sodium heart healthy  As directed    Diet Carb Modified  As directed    Discharge instructions  As directed    Comments:     Call your physician or seek immediate medical attention for recurrent chest pain, shortness of breath or worsening of  condition.        Medication List    STOP taking these medications       levofloxacin 750 MG tablet  Commonly known as:  LEVAQUIN      TAKE these medications       aspirin EC 81 MG tablet  Take 81 mg by mouth daily.     atorvastatin 20 MG tablet  Commonly known as:  LIPITOR  Take 1 tablet by mouth daily.     benzonatate 100 MG capsule  Commonly known as:  TESSALON  Take 100 mg by mouth 3 (three) times daily as needed for cough.     colchicine 0.6 MG tablet  Take 1 tablet (0.6 mg total) by mouth 2 (two) times daily.     FLAXSEED OIL PO  Take 1 tablet by mouth daily.     GABAPENTIN PO  Take 1 capsule by mouth 3 (three) times daily.     HYDROcodone-acetaminophen 10-325 MG per tablet  Commonly known as:  NORCO  Take 2 tablets by mouth 5 (five) times daily.     ibuprofen 600 MG tablet  Commonly known as:  ADVIL,MOTRIN  Take 1 tablet (600 mg total) by mouth 3 (three) times daily.     metFORMIN 500 MG tablet  Commonly known as:  GLUCOPHAGE  Take 500 mg by mouth 3 (three) times daily with meals.     multivitamin with minerals Tabs tablet  Take 1 tablet by mouth daily.     nitroGLYCERIN 0.4 MG SL tablet  Commonly known as:  NITROSTAT  Place 1 tablet (0.4 mg total) under the tongue every 5 (five) minutes as needed for chest pain.     pantoprazole 40 MG tablet  Commonly known as:  PROTONIX  Take 1 tablet (40 mg total) by mouth daily.       Allergies  Allergen Reactions  . Morphine And Related Nausea And Vomiting   The results of significant diagnostics from this hospitalization (including imaging, microbiology, ancillary and laboratory) are listed below for reference.    Significant Diagnostic Studies: Ct Angio Chest W/cm &/or Wo Cm  02/24/2013   CLINICAL DATA:  Pleuritic left-sided chest pain. Shortness of breath. Hyperglycemia. Elevated D-dimer. Rhonchi to the left lower lobe on physical examination.  EXAM: CT ANGIOGRAPHY CHEST WITH CONTRAST  TECHNIQUE: Multidetector CT imaging  of the chest was performed using the standard protocol during bolus administration of intravenous contrast. Multiplanar CT image reconstructions including MIPs were obtained to evaluate the vascular anatomy.  CONTRAST:  OMNIPAQUE IOHEXOL 350 MG/ML IV.  COMPARISON:  None.  FINDINGS: Contrast opacification of the pulmonary arteries is very good. No filling defects within either main pulmonary artery or their branches in either lung to suggest pulmonary embolism. Heart size upper normal to borderline enlarged with mild left ventricular hypertrophy. Three vessel coronary atherosclerosis, severe involvement of the LAD. Very small pericardial effusion, localizing to the superior recess. Moderate atherosclerosis involving the thoracic and visualized upper abdominal aorta without aneurysm or dissection. Atherosclerosis at the origins of the great vessels without significant stenosis.  Pulmonary parenchyma clear without localized airspace consolidation, interstitial disease, or parenchymal nodules or masses. No pleural effusions. Central airways patent with moderate to marked bronchial wall thickening. Scattered areas of hyperlucency in both lungs consistent with localized air trapping.  No significant mediastinal, hilar, or axillary lymphadenopathy. Visualized thyroid gland unremarkable. Fluid-filled, mildly dilated esophagus with borderline diffuse wall thickening. No visible esophageal mass. No evidence of hiatal hernia.  Visualized upper  abdomen unremarkable for the early arterial phase of enhancement. Bone window images demonstrate diffuse thoracic spondylosis and DISH.  Review of the MIP images confirms the above findings.  IMPRESSION: 1. No evidence of pulmonary embolism. 2. Mild cardiomegaly with left ventricular hypertrophy. Very small pericardial effusion in the superior recess. Three vessel coronary atherosclerosis, severe involvement of the LAD. 3. Mildly dilated, fluid-filled esophagus with borderline  diffuse wall thickening and no evidence of hiatal hernia. Query dysmotility and mild diffuse esophagitis. Does the patient have symptoms of esophagitis? 4. Moderate to marked bronchial wall thickening centrally and scattered areas of localized air trapping are consistent with asthma and/or bronchitis. No acute cardiopulmonary disease otherwise.   Electronically Signed   By: Hulan Saas M.D.   On: 02/24/2013 14:39    Microbiology: No results found for this or any previous visit (from the past 240 hour(s)).   Labs: Basic Metabolic Panel:  Recent Labs Lab 02/24/13 1334 02/25/13 0131  NA 139 132*  K 3.8 4.1  CL 99 97  CO2  --  25  GLUCOSE 322* 253*  BUN 10 15  CREATININE 0.60 0.62  CALCIUM  --  8.6   CBC:  Recent Labs Lab 02/24/13 1334 02/25/13 0131  WBC  --  10.3  HGB 16.7 14.5  HCT 49.0 42.5  MCV  --  93.4  PLT  --  224   Cardiac Enzymes:  Recent Labs Lab 02/24/13 1230 02/24/13 1916 02/25/13 0131 02/25/13 0740  TROPONINI <0.30 <0.30 <0.30 <0.30   CBG:  Recent Labs Lab 02/24/13 1745 02/24/13 2042 02/25/13 0733 02/25/13 1144  GLUCAP 312* 276* 212* 269*    Principal Problem:   Chest pain Active Problems:   Pleuritic pain   Murmur   Pericarditis   DM type 2 (diabetes mellitus, type 2)   HTN (hypertension)   Time coordinating discharge: 35 minutes  Signed:  Brendia Sacks, MD [facility] 02/25/2013, 3:54 PM

## 2013-02-26 ENCOUNTER — Telehealth: Payer: Self-pay

## 2013-02-26 NOTE — Telephone Encounter (Signed)
Called to advise patient of upcoming appointment 12/23 at 210 with Samara Deist.  Left message, pt to bring meds, arrive 15 min early.

## 2013-02-26 NOTE — Progress Notes (Signed)
Discussed with [facility] here in [place].  Has appointment with Harriet Pho, 12/23 at 2:10 PM , office will call patient to make aware.  Brendia Sacks, MD [facility]

## 2013-03-06 ENCOUNTER — Encounter: Payer: Self-pay | Admitting: Adult Health

## 2013-03-06 ENCOUNTER — Ambulatory Visit (INDEPENDENT_AMBULATORY_CARE_PROVIDER_SITE_OTHER): Payer: BC Managed Care – PPO | Admitting: Adult Health

## 2013-03-06 ENCOUNTER — Encounter: Payer: Self-pay | Admitting: *Deleted

## 2013-03-06 VITALS — BP 139/74 | HR 90 | Ht 67.0 in | Wt 230.0 lb

## 2013-03-06 DIAGNOSIS — R079 Chest pain, unspecified: Secondary | ICD-10-CM

## 2013-03-06 DIAGNOSIS — R9389 Abnormal findings on diagnostic imaging of other specified body structures: Secondary | ICD-10-CM

## 2013-03-06 DIAGNOSIS — I10 Essential (primary) hypertension: Secondary | ICD-10-CM

## 2013-03-06 DIAGNOSIS — E119 Type 2 diabetes mellitus without complications: Secondary | ICD-10-CM

## 2013-03-06 MED ORDER — NITROGLYCERIN 0.4 MG SL SUBL: 0.4000 mg | SUBLINGUAL_TABLET | SUBLINGUAL | Status: DC | PRN

## 2013-03-06 NOTE — Assessment & Plan Note (Signed)
Not well controlled by TG elevation and by his own admission. He is followed by PCP for ongoing treatment. He uses SQ insulin prn for BG elevation.

## 2013-03-06 NOTE — Assessment & Plan Note (Signed)
Currently controlled.  No changes in medications.

## 2013-03-06 NOTE — Progress Notes (Signed)
HPI: Jonathan Gilmore is a 62 year old patient of Dr. branch we are following for ongoing assessment and management of CAD, with new finding of three-vessel coronary artery disease per CT of the chest with severe involvement of the LAD, on recent hospitalization on 02/25/2013, with admission for pleuritic chest pain, presumed viral pericarditis, with a history of diabetes, hypertension and hypertriglyceridemia. During hospitalization cardiac enzymes are found be negative. She was also diagnosed as esophagitis. Due to abnormal CT scan she was asked to followup with cardiology posthospitalization care.   He comes today without complaints. 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 Blood glucose has been running high.           Allergies  Allergen Reactions  . Morphine And Related Nausea And Vomiting    Current Outpatient Prescriptions  Medication Sig Dispense Refill  . cyclobenzaprine (FLEXERIL) 10 MG tablet Take 10 mg by mouth 3 (three) times daily as needed for muscle spasms.      Marland Kitchen aspirin EC 81 MG tablet Take 81 mg by mouth daily.       Marland Kitchen atorvastatin (LIPITOR) 20 MG tablet Take 1 tablet by mouth daily.      . colchicine 0.6 MG tablet Take 1 tablet (0.6 mg total) by mouth 2 (two) times daily.  60 tablet  0  . Flaxseed, Linseed, (FLAXSEED OIL PO) Take 1 tablet by mouth daily.       Marland Kitchen GABAPENTIN PO Take 1 capsule by mouth 3 (three) times daily.      Marland Kitchen HYDROcodone-acetaminophen (NORCO) 10-325 MG per tablet Take 2 tablets by mouth 5 (five) times daily.      Marland Kitchen ibuprofen (ADVIL,MOTRIN) 600 MG tablet Take 1 tablet (600 mg total) by mouth 3 (three) times daily.  45 tablet  0  . lisinopril (PRINIVIL) 10 MG tablet Take 1 tablet (10 mg total) by mouth daily.  30 tablet  0  . metFORMIN (GLUCOPHAGE) 500 MG tablet Take 500 mg by mouth 3 (three) times daily with meals.       . Multiple Vitamin (MULTIVITAMIN WITH MINERALS) TABS Take 1 tablet by mouth daily.      .  nitroGLYCERIN (NITROSTAT) 0.4 MG SL tablet Place 1 tablet (0.4 mg total) under the tongue every 5 (five) minutes as needed for chest pain.  25 tablet  4  . pantoprazole (PROTONIX) 40 MG tablet Take 1 tablet (40 mg total) by mouth daily.  30 tablet  0   No current facility-administered medications for this visit.    Past Medical History  Diagnosis Date  . Hypertension   . Sleep apnea     tested at sleep apnea at [facility]  . Diabetes mellitus     since 1993  . Back pain     herniated disk L4-L5    Past Surgical History  Procedure Laterality Date  . Bone spur removed      back in 1997, left shoulder  . Hemorrhoid surgery    . Anterior cervical decomp/discectomy fusion  01/18/2011    Procedure: ANTERIOR CERVICAL DECOMPRESSION/DISCECTOMY FUSION 2 LEVELS;  Surgeon: Kathaleen Maser Pool;  Location: [facility];  Service: Neurosurgery;  Laterality: N/A;  anterior cervical discectomy and fusion with allograft and plating, cervical five-six, cervical six-seven  . Carpal tunnel release      right    ZOX:WRUEAV of systems complete and found to be negative unless listed above  PHYSICAL EXAM BP 139/74  Pulse 90  Ht 5\' 7"  (1.702 m)  Wt 230 lb (104.327 kg)  BMI 36.01 kg/m2  General: Well developed, well nourished, in no acute distress Head: Eyes PERRLA, No xanthomas.   Normal cephalic and atramatic  Lungs: Clear bilaterally to auscultation and percussion. Heart: HRRR S1 S2, without MRG.  Pulses are 2+ & equal.            No carotid bruit. No JVD.  No abdominal bruits. No femoral bruits. Abdomen: Bowel sounds are positive, abdomen soft and non-tender without masses or                  Hernia's noted.Obese.  Msk:  Back normal, normal gait. Normal strength and tone for age. Extremities: No clubbing, cyanosis or edema.  DP +1 Neuro: Alert and oriented X 3. Psych:  Good affect, responds appropriately    ASSESSMENT AND PLAN

## 2013-03-06 NOTE — Patient Instructions (Signed)
Your physician recommends that you schedule a follow-up appointment in: after stress test  The proper use and anticipated side effects of nitroglycerine has been carefully explained.  If a single episode of chest pain is not relieved by one tablet, the patient will try another within 5 minutes; and if this doesn't relieve the pain, the patient is instructed to call 911 for transportation to an emergency department.  Your physician has requested that you have en exercise stress myoview. For further information please visit [email or web address] Please follow instruction sheet, as given.

## 2013-03-06 NOTE — Progress Notes (Deleted)
Name: Jonathan Gilmore    DOB: 1950/08/21  Age: 62 y.o.  MR#: [id number]       PCP:  Samuel Jester, DO      Insurance: Payor: BLUE CROSS BLUE SHIELD / Plan: BCBS PPO OUT OF STATE / Product Type: *No Product type* /   CC:    Chief Complaint  Patient presents with  . Coronary Artery Disease  . Hypertension    VS Filed Vitals:   03/06/13 1410  BP: 139/74  Pulse: 90  Height: 5\' 7"  (1.702 m)  Weight: 230 lb (104.327 kg)    Weights Current Weight  03/06/13 230 lb (104.327 kg)  02/24/13 229 lb 4.5 oz (104 kg)  08/23/11 230 lb (104.327 kg)    Blood Pressure  BP Readings from Last 3 Encounters:  03/06/13 139/74  02/25/13 155/78  08/23/11 146/101     Admit date:  (Not on file) Last encounter with RMR:  Visit date not found   Allergy Morphine and related  Current Outpatient Prescriptions  Medication Sig Dispense Refill  . cyclobenzaprine (FLEXERIL) 10 MG tablet Take 10 mg by mouth 3 (three) times daily as needed for muscle spasms.      Marland Kitchen aspirin EC 81 MG tablet Take 81 mg by mouth daily.       Marland Kitchen atorvastatin (LIPITOR) 20 MG tablet Take 1 tablet by mouth daily.      . colchicine 0.6 MG tablet Take 1 tablet (0.6 mg total) by mouth 2 (two) times daily.  60 tablet  0  . Flaxseed, Linseed, (FLAXSEED OIL PO) Take 1 tablet by mouth daily.       Marland Kitchen GABAPENTIN PO Take 1 capsule by mouth 3 (three) times daily.      Marland Kitchen HYDROcodone-acetaminophen (NORCO) 10-325 MG per tablet Take 2 tablets by mouth 5 (five) times daily.      Marland Kitchen ibuprofen (ADVIL,MOTRIN) 600 MG tablet Take 1 tablet (600 mg total) by mouth 3 (three) times daily.  45 tablet  0  . lisinopril (PRINIVIL) 10 MG tablet Take 1 tablet (10 mg total) by mouth daily.  30 tablet  0  . metFORMIN (GLUCOPHAGE) 500 MG tablet Take 500 mg by mouth 3 (three) times daily with meals.       . Multiple Vitamin (MULTIVITAMIN WITH MINERALS) TABS Take 1 tablet by mouth daily.      . nitroGLYCERIN (NITROSTAT) 0.4 MG SL tablet Place 1 tablet (0.4 mg total)  under the tongue every 5 (five) minutes as needed for chest pain.  60 tablet  0  . pantoprazole (PROTONIX) 40 MG tablet Take 1 tablet (40 mg total) by mouth daily.  30 tablet  0   No current facility-administered medications for this visit.    Discontinued Meds:    Medications Discontinued During This Encounter  Medication Reason  . benzonatate (TESSALON) 100 MG capsule Error    Patient Active Problem List   Diagnosis Date Noted  . Pleuritic pain 02/24/2013  . Chest pain 02/24/2013  . Murmur 02/24/2013  . Pericarditis 02/24/2013  . DM type 2 (diabetes mellitus, type 2) 02/24/2013  . HTN (hypertension) 02/24/2013  . Cervical spondylosis with myelopathy 01/19/2011    LABS    Component Value Date/Time   NA 132* 02/25/2013 0131   NA 139 02/24/2013 1334   NA 134* 01/08/2011 1110   K 4.1 02/25/2013 0131   K 3.8 02/24/2013 1334   K 4.4 01/08/2011 1110   CL 97 02/25/2013 0131   CL 99  02/24/2013 1334   CL 97 01/08/2011 1110   CO2 25 02/25/2013 0131   CO2 24 01/08/2011 1110   CO2 27 12/15/2009 1413   GLUCOSE 253* 02/25/2013 0131   GLUCOSE 322* 02/24/2013 1334   GLUCOSE 186* 01/08/2011 1110   BUN 15 02/25/2013 0131   BUN 10 02/24/2013 1334   BUN 11 01/08/2011 1110   CREATININE 0.62 02/25/2013 0131   CREATININE 0.60 02/24/2013 1334   CREATININE 0.61 01/08/2011 1110   CALCIUM 8.6 02/25/2013 0131   CALCIUM 9.5 01/08/2011 1110   CALCIUM 9.7 12/15/2009 1413   GFRNONAA >90 02/25/2013 0131   GFRNONAA >90 01/08/2011 1110   GFRNONAA >60 12/15/2009 1413   GFRAA >90 02/25/2013 0131   GFRAA >90 01/08/2011 1110   GFRAA  Value: >60        The eGFR has been calculated using the MDRD equation. This calculation has not been validated in all clinical situations. eGFR's persistently <60 mL/min signify possible Chronic Kidney Disease. 12/15/2009 1413   CMP     Component Value Date/Time   NA 132* 02/25/2013 0131   K 4.1 02/25/2013 0131   CL 97 02/25/2013 0131   CO2 25 02/25/2013 0131    GLUCOSE 253* 02/25/2013 0131   BUN 15 02/25/2013 0131   CREATININE 0.62 02/25/2013 0131   CALCIUM 8.6 02/25/2013 0131   PROT 6.6 12/11/2009 1941   ALBUMIN 3.8 12/11/2009 1941   AST 44* 12/11/2009 1941   ALT 56* 12/11/2009 1941   ALKPHOS 55 12/11/2009 1941   BILITOT 0.7 12/11/2009 1941   GFRNONAA >90 02/25/2013 0131   GFRAA >90 02/25/2013 0131       Component Value Date/Time   WBC 10.3 02/25/2013 0131   WBC 11.2* 01/08/2011 1110   WBC 9.2 12/15/2009 1413   HGB 14.5 02/25/2013 0131   HGB 16.7 02/24/2013 1334   HGB 17.4* 01/08/2011 1110   HCT 42.5 02/25/2013 0131   HCT 49.0 02/24/2013 1334   HCT 49.6 01/08/2011 1110   MCV 93.4 02/25/2013 0131   MCV 88.9 01/08/2011 1110   MCV 92.8 12/15/2009 1413    Lipid Panel     Component Value Date/Time   CHOL 131 02/25/2013 0131   TRIG 810* 02/25/2013 0131   HDL 27* 02/25/2013 0131   CHOLHDL 4.9 02/25/2013 0131   VLDL UNABLE TO CALCULATE IF TRIGLYCERIDE OVER 400 mg/dL 13/24/4010 2725   LDLCALC UNABLE TO CALCULATE IF TRIGLYCERIDE OVER 400 mg/dL 36/64/4034 7425    ABG    Component Value Date/Time   TCO2 25 02/24/2013 1334     No results found for this basename: TSH   BNP (last 3 results) No results found for this basename: PROBNP,  in the last 8760 hours Cardiac Panel (last 3 results) No results found for this basename: CKTOTAL, CKMB, TROPONINI, RELINDX,  in the last 72 hours  Iron/TIBC/Ferritin No results found for this basename: iron, tibc, ferritin     EKG Orders placed during the hospital encounter of 02/24/13  . EKG 12-LEAD  . EKG 12-LEAD  . EKG 12-LEAD  . EKG 12-LEAD  . EKG 12-LEAD  . EKG 12-LEAD  . EKG     Prior Assessment and Plan Problem List as of 03/06/2013   Cervical spondylosis with myelopathy   Pleuritic pain   Chest pain   Murmur   Pericarditis   DM type 2 (diabetes mellitus, type 2)   HTN (hypertension)       Imaging: Ct Angio Chest W/cm &/or Wo Cm  02/24/2013   CLINICAL DATA:  Pleuritic  left-sided chest pain. Shortness of breath. Hyperglycemia. Elevated D-dimer. Rhonchi to the left lower lobe on physical examination.  EXAM: CT ANGIOGRAPHY CHEST WITH CONTRAST  TECHNIQUE: Multidetector CT imaging of the chest was performed using the standard protocol during bolus administration of intravenous contrast. Multiplanar CT image reconstructions including MIPs were obtained to evaluate the vascular anatomy.  CONTRAST:  OMNIPAQUE IOHEXOL 350 MG/ML IV.  COMPARISON:  None.  FINDINGS: Contrast opacification of the pulmonary arteries is very good. No filling defects within either main pulmonary artery or their branches in either lung to suggest pulmonary embolism. Heart size upper normal to borderline enlarged with mild left ventricular hypertrophy. Three vessel coronary atherosclerosis, severe involvement of the LAD. Very small pericardial effusion, localizing to the superior recess. Moderate atherosclerosis involving the thoracic and visualized upper abdominal aorta without aneurysm or dissection. Atherosclerosis at the origins of the great vessels without significant stenosis.  Pulmonary parenchyma clear without localized airspace consolidation, interstitial disease, or parenchymal nodules or masses. No pleural effusions. Central airways patent with moderate to marked bronchial wall thickening. Scattered areas of hyperlucency in both lungs consistent with localized air trapping.  No significant mediastinal, hilar, or axillary lymphadenopathy. Visualized thyroid gland unremarkable. Fluid-filled, mildly dilated esophagus with borderline diffuse wall thickening. No visible esophageal mass. No evidence of hiatal hernia.  Visualized upper abdomen unremarkable for the early arterial phase of enhancement. Bone window images demonstrate diffuse thoracic spondylosis and DISH.  Review of the MIP images confirms the above findings.  IMPRESSION: 1. No evidence of pulmonary embolism. 2. Mild cardiomegaly with left  ventricular hypertrophy. Very small pericardial effusion in the superior recess. Three vessel coronary atherosclerosis, severe involvement of the LAD. 3. Mildly dilated, fluid-filled esophagus with borderline diffuse wall thickening and no evidence of hiatal hernia. Query dysmotility and mild diffuse esophagitis. Does the patient have symptoms of esophagitis? 4. Moderate to marked bronchial wall thickening centrally and scattered areas of localized air trapping are consistent with asthma and/or bronchitis. No acute cardiopulmonary disease otherwise.   Electronically Signed   By: Hulan Saas M.D.   On: 02/24/2013 14:39

## 2013-03-06 NOTE — Assessment & Plan Note (Signed)
He denies recurrent chest pain. However, he has multiple CVRF: Hypertension, Diabetes, Tobacco abuse, Hypercholesterolemia, obesity and age. Uncertain family history as he father died when he was a child. I have discussed with him the need to have stress cardiolite. He is willing to proceed, but wants to wait until after the first of year. I have Rx for NTG. Will see him after stress test is completed.

## 2013-03-15 DIAGNOSIS — R9439 Abnormal result of other cardiovascular function study: Secondary | ICD-10-CM

## 2013-03-15 HISTORY — DX: Abnormal result of other cardiovascular function study: R94.39

## 2013-03-21 ENCOUNTER — Encounter (HOSPITAL_COMMUNITY)
Admission: RE | Admit: 2013-03-21 | Discharge: 2013-03-21 | Disposition: A | Discharge status: Home / Self Care | Payer: BC Managed Care – PPO | Source: Ambulatory Visit | Attending: Cardiovascular Disease | Admitting: Cardiovascular Disease

## 2013-03-21 ENCOUNTER — Encounter (HOSPITAL_COMMUNITY): Payer: Self-pay

## 2013-03-21 ENCOUNTER — Encounter (HOSPITAL_COMMUNITY)
Admission: RE | Admit: 2013-03-21 | Discharge: 2013-03-21 | Disposition: A | Discharge status: Home / Self Care | Payer: BC Managed Care – PPO | Source: Ambulatory Visit | Attending: Adult Health | Admitting: Adult Health

## 2013-03-21 DIAGNOSIS — I2510 Atherosclerotic heart disease of native coronary artery without angina pectoris: Secondary | ICD-10-CM | POA: Insufficient documentation

## 2013-03-21 DIAGNOSIS — R9389 Abnormal findings on diagnostic imaging of other specified body structures: Secondary | ICD-10-CM

## 2013-03-21 DIAGNOSIS — I10 Essential (primary) hypertension: Secondary | ICD-10-CM

## 2013-03-21 DIAGNOSIS — R079 Chest pain, unspecified: Secondary | ICD-10-CM

## 2013-03-21 DIAGNOSIS — R9439 Abnormal result of other cardiovascular function study: Secondary | ICD-10-CM | POA: Insufficient documentation

## 2013-03-21 MED ORDER — SODIUM CHLORIDE 0.9 % IJ SOLN
INTRAMUSCULAR | Status: AC
  Administered 2013-03-21: 10 mL via INTRAVENOUS
  Filled 2013-03-21: qty 10

## 2013-03-21 MED ORDER — TECHNETIUM TC 99M SESTAMIBI - CARDIOLITE
10.0000 | Freq: Once | INTRAVENOUS | Status: AC | PRN
Start: 2013-03-21 — End: 2013-03-21
  Administered 2013-03-21: 10:00:00 10 via INTRAVENOUS

## 2013-03-21 MED ORDER — TECHNETIUM TC 99M SESTAMIBI - CARDIOLITE
30.0000 | Freq: Once | INTRAVENOUS | Status: AC | PRN
  Administered 2013-03-21: 12:00:00 30 via INTRAVENOUS

## 2013-03-21 NOTE — Progress Notes (Signed)
Stress Lab Nurses Notes - Forestine Na  Dakoda D Michele 03/21/2013 Reason for doing test: CAD and Chest Pain Type of test: Stress Cardiolite Nurse performing test: Gerrit Halls, RN Nuclear Medicine Tech: Melburn Hake Echo Tech: Not Applicable MD performing test: Dr. Lynann Beaver.Lawrence NP Family MD: Octavio Graves DO Test explained and consent signed: yes IV started: 22g jelco, Saline lock flushed, No redness or edema and Saline lock started in radiology Symptoms: Fatigue in legs  Treatment/Intervention: None Reason test stopped: fatigue and reached target HR After recovery IV was: Discontinued via X-ray tech and No redness or edema Patient to return to Nuc. Med at : 12:00 Patient discharged: Home Patient's Condition upon discharge was: stable Comments: During test peak BP 196/84(during recovery) & HR 142.  Recovery BP 178/80 & HR 97.  Symptoms resolved in recovery. Geanie Cooley T

## 2013-03-26 ENCOUNTER — Encounter: Payer: Self-pay | Admitting: Adult Health

## 2013-03-26 ENCOUNTER — Ambulatory Visit (INDEPENDENT_AMBULATORY_CARE_PROVIDER_SITE_OTHER): Payer: BC Managed Care – PPO | Admitting: Adult Health

## 2013-03-26 VITALS — BP 138/72 | HR 92 | Ht 66.0 in | Wt 228.0 lb

## 2013-03-26 DIAGNOSIS — R079 Chest pain, unspecified: Secondary | ICD-10-CM

## 2013-03-26 DIAGNOSIS — I10 Essential (primary) hypertension: Secondary | ICD-10-CM

## 2013-03-26 NOTE — Progress Notes (Signed)
HPI: Jonathan Gilmore Is a 63 year old patient of Dr. Harl Bowie we are following for ongoing assessment and management of CAD with new findings of three-vessel coronary artery disease per CT of the chest is severe involvement of the LAD on recent hospitalization in December the patient has a history of pleuritic chest pain with presumed viral pericarditis. Other history includes diabetes hypertension hypertriglyceridemia the patient was last seen in the office in December of 2014. At that time we discussed the need to proceed with stress test for further evaluation secondary to abnormal CT scan. The patient wanted to wait until after the first severe to be scheduled, he was given a prescription for nitroglycerin. Stress Myoview was completed on 03/21/2013.    IMPRESSION: 1. Abnormal exercise myocardial perfusion imaging stress test 2. Inferior wall defect likely due to subdiaphragmatic attenuation, its wall motion is similar to other areas without a perfusion defect. 3. Likely small anteroseptal infract, this area is more hypokinetic than the rest of the myocardium 3. Low left ventricular systolic function with global hypokinesis Gated imaging showed an end-diastolic volume of 161 mL, end systolic volume of 99 mL, left ventricular ejection fraction of 37%, t.i.d. 1.04. 4. Mildly reduced functional capacity (90% of age and gender predicted) 5. Overall increased risk for major cardiac events based on low ejection fraction. There is no current myocardium at jeopardy.  He comes today without complaints. He is anxious about results of stress test. He denies recurrent chest pain, dyspnea on exertion. He has started a new job in [place].  Allergies  Allergen Reactions  . Morphine And Related Nausea And Vomiting    Current Outpatient Prescriptions  Medication Sig Dispense Refill  . aspirin EC 81 MG tablet Take 81 mg by mouth daily.       Marland Kitchen atorvastatin (LIPITOR) 20 MG tablet Take 1 tablet by mouth daily.       . colchicine 0.6 MG tablet Take 1 tablet (0.6 mg total) by mouth 2 (two) times daily.  60 tablet  0  . cyclobenzaprine (FLEXERIL) 10 MG tablet Take 10 mg by mouth 3 (three) times daily as needed for muscle spasms.      . Flaxseed, Linseed, (FLAXSEED OIL PO) Take 1 tablet by mouth daily.       Marland Kitchen GABAPENTIN PO Take 1 capsule by mouth 3 (three) times daily.      Marland Kitchen HYDROcodone-acetaminophen (NORCO) 10-325 MG per tablet Take 2 tablets by mouth 5 (five) times daily.      Marland Kitchen ibuprofen (ADVIL,MOTRIN) 600 MG tablet Take 1 tablet (600 mg total) by mouth 3 (three) times daily.  45 tablet  0  . lisinopril (PRINIVIL) 10 MG tablet Take 1 tablet (10 mg total) by mouth daily.  30 tablet  0  . metFORMIN (GLUCOPHAGE) 500 MG tablet Take 500 mg by mouth 3 (three) times daily with meals.       . Multiple Vitamin (MULTIVITAMIN WITH MINERALS) TABS Take 1 tablet by mouth daily.      . nitroGLYCERIN (NITROSTAT) 0.4 MG SL tablet Place 1 tablet (0.4 mg total) under the tongue every 5 (five) minutes as needed for chest pain.  25 tablet  4  . pantoprazole (PROTONIX) 40 MG tablet Take 1 tablet (40 mg total) by mouth daily.  30 tablet  0   No current facility-administered medications for this visit.    Past Medical History  Diagnosis Date  . Hypertension   . Sleep apnea     tested at sleep  apnea at [facility]  . Diabetes mellitus     since 1993  . Back pain     herniated disk L4-L5    Past Surgical History  Procedure Laterality Date  . Bone spur removed      back in 1997, left shoulder  . Hemorrhoid surgery    . Anterior cervical decomp/discectomy fusion  01/18/2011    Procedure: ANTERIOR CERVICAL DECOMPRESSION/DISCECTOMY FUSION 2 LEVELS;  Surgeon: Cooper Render Pool;  Location: [facility];  Service: Neurosurgery;  Laterality: N/A;  anterior cervical discectomy and fusion with allograft and plating, cervical five-six, cervical six-seven  . Carpal tunnel release      right    ROS: PHYSICAL EXAM BP 138/72   Pulse 92  Ht 5\' 6"  (1.676 m)  Wt 228 lb (103.42 kg)  BMI 36.82 kg/m2  EKG:  ASSESSMENT AND PLAN

## 2013-03-26 NOTE — Assessment & Plan Note (Signed)
I have reviewed his stress test results with Dr. Pennelope Bracken on site. He states that unless the patient is symptomatic he would not proceed with cardiac catheterization at this time. He believes that they are low ejection fraction noted on the nuclear study was related to PVCs. And less likely related to T.I.D. it was his recommendation that repeat a limited study echo for reevaluation of LV function.  I talked with the patient about his symptoms. He states he continues to have occasional PVCs but has been for years. He denies any recurrence of chest pain or shortness of breath. He refuses repeat echocardiogram at this time. He states he has to pay off bills and would prefer not to have another test increasing his financial burden.  I have talked with the patient about this, and I suggested that if he become more symptomatic, to include pressure in his chest, dyspnea, or generalized fatigue, he is to call us at which time we will schedule a cardiac catheterization. He verbalizes understanding. He would like to wait to proceed with this unless it is necessary.

## 2013-03-26 NOTE — Assessment & Plan Note (Signed)
Blood pressure is well-controlled. Would not make any changes in his medication. Continue risk modification.

## 2013-03-26 NOTE — Progress Notes (Deleted)
Name: Jonathan Gilmore    DOB: 09-23-1950  Age: 63 y.o.  MR#: [id number]       PCP:  Octavio Graves, DO      Insurance: Payor: [place] / Plan: [place] / Product Type: *No Product type* /   CC:    Chief Complaint  Patient presents with  . Coronary Artery Disease    VS Filed Vitals:   03/26/13 1508  BP: 138/72  Pulse: 92  Height: 5' 6" (1.676 m)  Weight: 228 lb (103.42 kg)    Weights Current Weight  03/26/13 228 lb (103.42 kg)  03/06/13 230 lb (104.327 kg)  02/24/13 229 lb 4.5 oz (104 kg)    Blood Pressure  BP Readings from Last 3 Encounters:  03/26/13 138/72  03/06/13 139/74  02/25/13 155/78     Admit date:  (Not on file) Last encounter with RMR:  03/06/2013   Allergy Morphine and related  Current Outpatient Prescriptions  Medication Sig Dispense Refill  . aspirin EC 81 MG tablet Take 81 mg by mouth daily.       Marland Kitchen atorvastatin (LIPITOR) 20 MG tablet Take 1 tablet by mouth daily.      . colchicine 0.6 MG tablet Take 1 tablet (0.6 mg total) by mouth 2 (two) times daily.  60 tablet  0  . cyclobenzaprine (FLEXERIL) 10 MG tablet Take 10 mg by mouth 3 (three) times daily as needed for muscle spasms.      . Flaxseed, Linseed, (FLAXSEED OIL PO) Take 1 tablet by mouth daily.       Marland Kitchen GABAPENTIN PO Take 1 capsule by mouth 3 (three) times daily.      Marland Kitchen HYDROcodone-acetaminophen (NORCO) 10-325 MG per tablet Take 2 tablets by mouth 5 (five) times daily.      Marland Kitchen ibuprofen (ADVIL,MOTRIN) 600 MG tablet Take 1 tablet (600 mg total) by mouth 3 (three) times daily.  45 tablet  0  . lisinopril (PRINIVIL) 10 MG tablet Take 1 tablet (10 mg total) by mouth daily.  30 tablet  0  . metFORMIN (GLUCOPHAGE) 500 MG tablet Take 500 mg by mouth 3 (three) times daily with meals.       . Multiple Vitamin (MULTIVITAMIN WITH MINERALS) TABS Take 1 tablet by mouth daily.      . nitroGLYCERIN (NITROSTAT) 0.4 MG SL tablet Place 1 tablet (0.4 mg total) under the tongue every 5 (five)  minutes as needed for chest pain.  25 tablet  4  . pantoprazole (PROTONIX) 40 MG tablet Take 1 tablet (40 mg total) by mouth daily.  30 tablet  0   No current facility-administered medications for this visit.    Discontinued Meds:   There are no discontinued medications.  Patient Active Problem List   Diagnosis Date Noted  . Pleuritic pain 02/24/2013  . Chest pain 02/24/2013  . Murmur 02/24/2013  . Pericarditis 02/24/2013  . DM type 2 (diabetes mellitus, type 2) 02/24/2013  . HTN (hypertension) 02/24/2013  . Cervical spondylosis with myelopathy 01/19/2011    LABS    Component Value Date/Time   NA 132* 02/25/2013 0131   NA 139 02/24/2013 1334   NA 134* 01/08/2011 1110   K 4.1 02/25/2013 0131   K 3.8 02/24/2013 1334   K 4.4 01/08/2011 1110   CL 97 02/25/2013 0131   CL 99 02/24/2013 1334   CL 97 01/08/2011 1110   CO2 25 02/25/2013 0131   CO2 24 01/08/2011 1110  CO2 27 12/15/2009 1413   GLUCOSE 253* 02/25/2013 0131   GLUCOSE 322* 02/24/2013 1334   GLUCOSE 186* 01/08/2011 1110   BUN 15 02/25/2013 0131   BUN 10 02/24/2013 1334   BUN 11 01/08/2011 1110   CREATININE 0.62 02/25/2013 0131   CREATININE 0.60 02/24/2013 1334   CREATININE 0.61 01/08/2011 1110   CALCIUM 8.6 02/25/2013 0131   CALCIUM 9.5 01/08/2011 1110   CALCIUM 9.7 12/15/2009 1413   GFRNONAA >90 02/25/2013 0131   GFRNONAA >90 01/08/2011 1110   GFRNONAA >60 12/15/2009 1413   GFRAA >90 02/25/2013 0131   GFRAA >90 01/08/2011 1110   GFRAA  Value: >60        The eGFR has been calculated using the MDRD equation. This calculation has not been validated in all clinical situations. eGFR's persistently <60 mL/min signify possible Chronic Kidney Disease. 12/15/2009 1413   CMP     Component Value Date/Time   NA 132* 02/25/2013 0131   K 4.1 02/25/2013 0131   CL 97 02/25/2013 0131   CO2 25 02/25/2013 0131   GLUCOSE 253* 02/25/2013 0131   BUN 15 02/25/2013 0131   CREATININE 0.62 02/25/2013 0131   CALCIUM 8.6  02/25/2013 0131   PROT 6.6 12/11/2009 1941   ALBUMIN 3.8 12/11/2009 1941   AST 44* 12/11/2009 1941   ALT 56* 12/11/2009 1941   ALKPHOS 55 12/11/2009 1941   BILITOT 0.7 12/11/2009 1941   GFRNONAA >90 02/25/2013 0131   GFRAA >90 02/25/2013 0131       Component Value Date/Time   WBC 10.3 02/25/2013 0131   WBC 11.2* 01/08/2011 1110   WBC 9.2 12/15/2009 1413   HGB 14.5 02/25/2013 0131   HGB 16.7 02/24/2013 1334   HGB 17.4* 01/08/2011 1110   HCT 42.5 02/25/2013 0131   HCT 49.0 02/24/2013 1334   HCT 49.6 01/08/2011 1110   MCV 93.4 02/25/2013 0131   MCV 88.9 01/08/2011 1110   MCV 92.8 12/15/2009 1413    Lipid Panel     Component Value Date/Time   CHOL 131 02/25/2013 0131   TRIG 810* 02/25/2013 0131   HDL 27* 02/25/2013 0131   CHOLHDL 4.9 02/25/2013 0131   VLDL UNABLE TO CALCULATE IF TRIGLYCERIDE OVER 400 mg/dL 02/25/2013 0131   LDLCALC UNABLE TO CALCULATE IF TRIGLYCERIDE OVER 400 mg/dL 02/25/2013 0131    ABG    Component Value Date/Time   TCO2 25 02/24/2013 1334     No results found for this basename: TSH   BNP (last 3 results) No results found for this basename: PROBNP,  in the last 8760 hours Cardiac Panel (last 3 results) No results found for this basename: CKTOTAL, CKMB, TROPONINI, RELINDX,  in the last 72 hours  Iron/TIBC/Ferritin No results found for this basename: iron, tibc, ferritin     EKG Orders placed during the hospital encounter of 02/24/13  . EKG 12-LEAD  . EKG 12-LEAD  . EKG 12-LEAD  . EKG 12-LEAD  . EKG 12-LEAD  . EKG 12-LEAD  . EKG     Prior Assessment and Plan Problem List as of 03/26/2013   Cervical spondylosis with myelopathy   Pleuritic pain   Chest pain   Last Assessment & Plan   03/06/2013 Office Visit Written 03/06/2013  3:08 PM by Lendon Colonel, NP     He denies recurrent chest pain. However, he has multiple CVRF: Hypertension, Diabetes, Tobacco abuse, Hypercholesterolemia, obesity and age. Uncertain family history as he father died  when he was a child.  I have discussed with him the need to have stress cardiolite. He is willing to proceed, but wants to wait until after the first of year. I have Rx for NTG. Will see him after stress test is completed.    Murmur   Pericarditis   DM type 2 (diabetes mellitus, type 2)   Last Assessment & Plan   03/06/2013 Office Visit Written 03/06/2013  3:10 PM by Lendon Colonel, NP     Not well controlled by TG elevation and by his own admission. He is followed by PCP for ongoing treatment. He uses SQ insulin prn for BG elevation.    HTN (hypertension)   Last Assessment & Plan   03/06/2013 Office Visit Written 03/06/2013  3:11 PM by Lendon Colonel, NP     Currently controlled.  No changes in medications.        Imaging: Nm Myocar Single W/spect W/wall Motion And Ef  03/21/2013   CLINICAL DATA:  63 year old male with evidence of coronary calcification by CT scan, referred for functional assessment of obstructive coronary artery disease.  EXAM: MYOCARDIAL IMAGING WITH SPECT (REST AND EXERCISE)  GATED LEFT VENTRICULAR WALL MOTION STUDY  LEFT VENTRICULAR EJECTION FRACTION  TECHNIQUE: Standard myocardial SPECT imaging was performed after resting intravenous injection of 10 mCi [id number]. Subsequently, exercise tolerance test was performed by the patient under the supervision of the Cardiology staff. At peak-stress, 30 mCi [id number] was injected intravenously and standard myocardial SPECT imaging was performed. Quantitative gated imaging was also performed to evaluate left ventricular wall motion, and estimate left ventricular ejection fraction.  COMPARISON:  None.  FINDINGS: Exercise stress  The patient was exercised by the Bruce protocol. He exercised for 5 min, achieving a work level of 7 Mets. The resting heart rate increased from 88 beats per min 242 beats per min. This value represents 89% of the maximal age predicted heart rate. The resting blood pressure increased from  132/81 to 196/84. The stress test was stopped due to fatigue and leg discomfort.  Baseline EKG showed normal sinus rhythm with PVCs. With exercise there were no ischemic EKG changes or arrhythmias.  Myocardial perfusion imaging  Raw images showed appropriate myocardial tracer uptake. There was a medium sized fixed defect in the inferior wall seen in the resting and post stress images. The anteroseptal wall shows a small fixed defect. Overall wall motion was moderately decreased, with more pronounced hypokinesis of the anteroseptal wall. Based on these findings, the inferior wall defect appears to be most likely secondary to subdiaphragmatic attenuation. The anteroseptal wall is suggestive of possible prior infarction.  Gated imaging showed an end-diastolic volume of 15196L, end systolic volume of 99 mL, left ventricular ejection fraction of 37%, t.i.d. 1.04.  IMPRESSION: 1.  Abnormal exercise myocardial perfusion imaging stress test  2. Inferior wall defect likely due to subdiaphragmatic attenuation, its wall motion is similar to other areas without a perfusion defect.  3. Likely small anteroseptal infract, this area is more hypokinetic than the rest of the myocardium  3.  Low left ventricular systolic function with global hypokinesis  4. Mildly reduced functional capacity (90% of age and gender predicted)  5. Overall increased risk for major cardiac events based on low ejection fraction. There is no current myocardium at jeopardy.   Electronically Signed   By: JoCarlyle Dolly On: 03/21/2013 16:57

## 2013-03-26 NOTE — Patient Instructions (Signed)
Your physician recommends that you schedule a follow-up appointment in: 6 months You will receive a reminder letter two months in advance reminding you to call and schedule your appointment. If you don't receive this letter, please contact our office.  Your physician recommends that you continue on your current medications as directed. Please refer to the Current Medication list given to you today.     

## 2013-04-28 ENCOUNTER — Inpatient Hospital Stay (HOSPITAL_COMMUNITY)
Admission: EM | Admit: 2013-04-28 | Discharge: 2013-04-30 | DRG: 872 | Disposition: A | Discharge status: Home / Self Care | Payer: PRIVATE HEALTH INSURANCE | Attending: Family Medicine | Admitting: Family Medicine

## 2013-04-28 ENCOUNTER — Encounter (HOSPITAL_COMMUNITY): Payer: Self-pay | Admitting: Emergency Medicine

## 2013-04-28 ENCOUNTER — Inpatient Hospital Stay (HOSPITAL_COMMUNITY): Payer: PRIVATE HEALTH INSURANCE

## 2013-04-28 DIAGNOSIS — R220 Localized swelling, mass and lump, head: Secondary | ICD-10-CM | POA: Diagnosis not present

## 2013-04-28 DIAGNOSIS — A419 Sepsis, unspecified organism: Secondary | ICD-10-CM | POA: Diagnosis present

## 2013-04-28 DIAGNOSIS — E1169 Type 2 diabetes mellitus with other specified complication: Secondary | ICD-10-CM | POA: Diagnosis present

## 2013-04-28 DIAGNOSIS — I5032 Chronic diastolic (congestive) heart failure: Secondary | ICD-10-CM | POA: Diagnosis not present

## 2013-04-28 DIAGNOSIS — E669 Obesity, unspecified: Secondary | ICD-10-CM | POA: Diagnosis present

## 2013-04-28 DIAGNOSIS — Z833 Family history of diabetes mellitus: Secondary | ICD-10-CM

## 2013-04-28 DIAGNOSIS — I509 Heart failure, unspecified: Secondary | ICD-10-CM | POA: Diagnosis present

## 2013-04-28 DIAGNOSIS — L0201 Cutaneous abscess of face: Secondary | ICD-10-CM | POA: Diagnosis present

## 2013-04-28 DIAGNOSIS — E1149 Type 2 diabetes mellitus with other diabetic neurological complication: Secondary | ICD-10-CM | POA: Diagnosis present

## 2013-04-28 DIAGNOSIS — M109 Gout, unspecified: Secondary | ICD-10-CM | POA: Diagnosis present

## 2013-04-28 DIAGNOSIS — R0781 Pleurodynia: Secondary | ICD-10-CM

## 2013-04-28 DIAGNOSIS — G4730 Sleep apnea, unspecified: Secondary | ICD-10-CM | POA: Diagnosis present

## 2013-04-28 DIAGNOSIS — Z79899 Other long term (current) drug therapy: Secondary | ICD-10-CM

## 2013-04-28 DIAGNOSIS — I319 Disease of pericardium, unspecified: Secondary | ICD-10-CM

## 2013-04-28 DIAGNOSIS — M4712 Other spondylosis with myelopathy, cervical region: Secondary | ICD-10-CM

## 2013-04-28 DIAGNOSIS — Z6836 Body mass index (BMI) 36.0-36.9, adult: Secondary | ICD-10-CM

## 2013-04-28 DIAGNOSIS — E1159 Type 2 diabetes mellitus with other circulatory complications: Secondary | ICD-10-CM | POA: Diagnosis present

## 2013-04-28 DIAGNOSIS — E119 Type 2 diabetes mellitus without complications: Secondary | ICD-10-CM

## 2013-04-28 DIAGNOSIS — E782 Mixed hyperlipidemia: Secondary | ICD-10-CM | POA: Diagnosis present

## 2013-04-28 DIAGNOSIS — F17200 Nicotine dependence, unspecified, uncomplicated: Secondary | ICD-10-CM

## 2013-04-28 DIAGNOSIS — I5030 Unspecified diastolic (congestive) heart failure: Secondary | ICD-10-CM | POA: Diagnosis present

## 2013-04-28 DIAGNOSIS — E785 Hyperlipidemia, unspecified: Secondary | ICD-10-CM

## 2013-04-28 DIAGNOSIS — E1142 Type 2 diabetes mellitus with diabetic polyneuropathy: Secondary | ICD-10-CM | POA: Diagnosis present

## 2013-04-28 DIAGNOSIS — I10 Essential (primary) hypertension: Secondary | ICD-10-CM | POA: Diagnosis present

## 2013-04-28 DIAGNOSIS — E871 Hypo-osmolality and hyponatremia: Secondary | ICD-10-CM | POA: Diagnosis present

## 2013-04-28 DIAGNOSIS — R221 Localized swelling, mass and lump, neck: Secondary | ICD-10-CM | POA: Diagnosis not present

## 2013-04-28 DIAGNOSIS — R079 Chest pain, unspecified: Secondary | ICD-10-CM

## 2013-04-28 DIAGNOSIS — Z7982 Long term (current) use of aspirin: Secondary | ICD-10-CM | POA: Diagnosis not present

## 2013-04-28 DIAGNOSIS — L03211 Cellulitis of face: Secondary | ICD-10-CM | POA: Diagnosis present

## 2013-04-28 DIAGNOSIS — R011 Cardiac murmur, unspecified: Secondary | ICD-10-CM

## 2013-04-28 DIAGNOSIS — E1100 Type 2 diabetes mellitus with hyperosmolarity without nonketotic hyperglycemic-hyperosmolar coma (NKHHC): Secondary | ICD-10-CM | POA: Diagnosis present

## 2013-04-28 LAB — GLUCOSE, CAPILLARY
GLUCOSE-CAPILLARY: 234 mg/dL — AB (ref 70–99)
GLUCOSE-CAPILLARY: 285 mg/dL — AB (ref 70–99)
Glucose-Capillary: 278 mg/dL — ABNORMAL HIGH (ref 70–99)

## 2013-04-28 LAB — BASIC METABOLIC PANEL
BUN: 9 mg/dL (ref 6–23)
CO2: 29 mEq/L (ref 19–32)
Calcium: 9.1 mg/dL (ref 8.4–10.5)
Chloride: 93 mEq/L — ABNORMAL LOW (ref 96–112)
Creatinine, Ser: 0.5 mg/dL (ref 0.50–1.35)
GFR calc non Af Amer: >90 mL/min (ref >90)
Glucose, Bld: 282 mg/dL — ABNORMAL HIGH (ref 70–99)
POTASSIUM: 3.9 meq/L (ref 3.7–5.3)
SODIUM: 134 meq/L — AB (ref 137–147)

## 2013-04-28 LAB — CBC WITH DIFFERENTIAL/PLATELET
BASOS PCT: 1 % (ref 0–1)
Basophils Absolute: 0.1 10*3/uL (ref 0.0–0.1)
Eosinophils Absolute: 0.3 10*3/uL (ref 0.0–0.7)
Eosinophils Relative: 2 % (ref 0–5)
HEMATOCRIT: 45.3 % (ref 39.0–52.0)
HEMOGLOBIN: 15.4 g/dL (ref 13.0–17.0)
LYMPHS ABS: 2.8 10*3/uL (ref 0.7–4.0)
LYMPHS PCT: 22 % (ref 12–46)
MCH: 31.3 pg (ref 26.0–34.0)
MCHC: 34 g/dL (ref 30.0–36.0)
MCV: 92.1 fL (ref 78.0–100.0)
MONO ABS: 0.9 10*3/uL (ref 0.1–1.0)
MONOS PCT: 7 % (ref 3–12)
NEUTROS ABS: 8.8 10*3/uL — AB (ref 1.7–7.7)
Neutrophils Relative %: 69 % (ref 43–77)
Platelets: 251 10*3/uL (ref 150–400)
RBC: 4.92 MIL/uL (ref 4.22–5.81)
RDW: 13.1 % (ref 11.5–15.5)
WBC: 12.9 10*3/uL — ABNORMAL HIGH (ref 4.0–10.5)

## 2013-04-28 LAB — HEMOGLOBIN A1C
HEMOGLOBIN A1C: 10.6 % — AB (ref <5.7)
MEAN PLASMA GLUCOSE: 258 mg/dL — AB (ref <117)

## 2013-04-28 MED ORDER — HYDROCODONE-ACETAMINOPHEN 10-325 MG PO TABS
1.0000 | ORAL_TABLET | Freq: Four times a day (QID) | ORAL | Status: DC | PRN
  Administered 2013-04-28 – 2013-04-29 (×4): 2 via ORAL
  Administered 2013-04-29: 1 via ORAL
  Administered 2013-04-30: 2 via ORAL
  Filled 2013-04-28 (×6): qty 2

## 2013-04-28 MED ORDER — HYDROMORPHONE HCL PF 1 MG/ML IJ SOLN
1.0000 mg | Freq: Once | INTRAMUSCULAR | Status: AC
  Administered 2013-04-28: 1 mg via INTRAVENOUS
  Filled 2013-04-28: qty 1

## 2013-04-28 MED ORDER — SODIUM CHLORIDE 0.9 % IV SOLN
INTRAVENOUS | Status: DC
  Administered 2013-04-28 – 2013-04-29 (×2): via INTRAVENOUS

## 2013-04-28 MED ORDER — IOHEXOL 300 MG/ML  SOLN
80.0000 mL | Freq: Once | INTRAMUSCULAR | Status: AC | PRN
  Administered 2013-04-28: 80 mL via INTRAVENOUS

## 2013-04-28 MED ORDER — NICOTINE 21 MG/24HR TD PT24
21.0000 mg | MEDICATED_PATCH | Freq: Every day | TRANSDERMAL | Status: DC
  Filled 2013-04-28 (×2): qty 1

## 2013-04-28 MED ORDER — ACETAMINOPHEN 650 MG RE SUPP: 650.0000 mg | Freq: Four times a day (QID) | RECTAL | Status: DC | PRN

## 2013-04-28 MED ORDER — DEXTROSE 5 % IV SOLN
2.0000 g | Freq: Two times a day (BID) | INTRAVENOUS | Status: DC
  Administered 2013-04-28 – 2013-04-30 (×5): 2 g via INTRAVENOUS
  Filled 2013-04-28 (×7): qty 2

## 2013-04-28 MED ORDER — SODIUM CHLORIDE 0.9 % IJ SOLN: 3.0000 mL | INTRAMUSCULAR | Status: DC | PRN

## 2013-04-28 MED ORDER — ATORVASTATIN CALCIUM 20 MG PO TABS
20.0000 mg | ORAL_TABLET | Freq: Every day | ORAL | Status: DC
  Administered 2013-04-28 – 2013-04-30 (×3): 20 mg via ORAL
  Filled 2013-04-28 (×3): qty 1

## 2013-04-28 MED ORDER — SODIUM CHLORIDE 0.9 % IV SOLN: 250.0000 mL | INTRAVENOUS | Status: DC | PRN

## 2013-04-28 MED ORDER — HYDROMORPHONE HCL 2 MG PO TABS: 1.0000 mg | ORAL_TABLET | ORAL | Status: DC | PRN

## 2013-04-28 MED ORDER — VANCOMYCIN HCL 10 G IV SOLR
2000.0000 mg | Freq: Once | INTRAVENOUS | Status: AC
  Administered 2013-04-28: 2000 mg via INTRAVENOUS
  Filled 2013-04-28: qty 2000

## 2013-04-28 MED ORDER — COLCHICINE 0.6 MG PO TABS
0.6000 mg | ORAL_TABLET | Freq: Two times a day (BID) | ORAL | Status: DC
  Administered 2013-04-28 – 2013-04-30 (×5): 0.6 mg via ORAL
  Filled 2013-04-28 (×5): qty 1

## 2013-04-28 MED ORDER — ACETAMINOPHEN 325 MG PO TABS: 650.0000 mg | ORAL_TABLET | Freq: Four times a day (QID) | ORAL | Status: DC | PRN

## 2013-04-28 MED ORDER — ONDANSETRON HCL 4 MG/2ML IJ SOLN
4.0000 mg | Freq: Once | INTRAMUSCULAR | Status: AC
  Administered 2013-04-28: 4 mg via INTRAVENOUS
  Filled 2013-04-28: qty 2

## 2013-04-28 MED ORDER — CYCLOBENZAPRINE HCL 10 MG PO TABS
10.0000 mg | ORAL_TABLET | Freq: Three times a day (TID) | ORAL | Status: DC | PRN
  Administered 2013-04-29: 10 mg via ORAL
  Filled 2013-04-28: qty 1

## 2013-04-28 MED ORDER — ONDANSETRON HCL 4 MG PO TABS: 4.0000 mg | ORAL_TABLET | Freq: Four times a day (QID) | ORAL | Status: DC | PRN

## 2013-04-28 MED ORDER — INSULIN ASPART 100 UNIT/ML ~~LOC~~ SOLN
0.0000 [IU] | Freq: Three times a day (TID) | SUBCUTANEOUS | Status: DC
  Administered 2013-04-28: 5 [IU] via SUBCUTANEOUS
  Administered 2013-04-28: 8 [IU] via SUBCUTANEOUS
  Administered 2013-04-29: 11 [IU] via SUBCUTANEOUS
  Administered 2013-04-29: 3 [IU] via SUBCUTANEOUS
  Administered 2013-04-29: 5 [IU] via SUBCUTANEOUS
  Administered 2013-04-30: 8 [IU] via SUBCUTANEOUS

## 2013-04-28 MED ORDER — VANCOMYCIN HCL IN DEXTROSE 1-5 GM/200ML-% IV SOLN
1000.0000 mg | Freq: Two times a day (BID) | INTRAVENOUS | Status: DC
  Administered 2013-04-28 – 2013-04-30 (×4): 1000 mg via INTRAVENOUS
  Filled 2013-04-28 (×6): qty 200

## 2013-04-28 MED ORDER — ADULT MULTIVITAMIN W/MINERALS CH
1.0000 | ORAL_TABLET | Freq: Every day | ORAL | Status: DC
  Administered 2013-04-28 – 2013-04-30 (×3): 1 via ORAL
  Filled 2013-04-28 (×3): qty 1

## 2013-04-28 MED ORDER — SODIUM CHLORIDE 0.9 % IJ SOLN
INTRAMUSCULAR | Status: AC
  Filled 2013-04-28: qty 1000

## 2013-04-28 MED ORDER — ASPIRIN EC 81 MG PO TBEC
81.0000 mg | DELAYED_RELEASE_TABLET | Freq: Every day | ORAL | Status: DC
  Administered 2013-04-29 – 2013-04-30 (×2): 81 mg via ORAL
  Filled 2013-04-28 (×2): qty 1

## 2013-04-28 MED ORDER — NITROGLYCERIN 0.4 MG SL SUBL: 0.4000 mg | SUBLINGUAL_TABLET | SUBLINGUAL | Status: DC | PRN

## 2013-04-28 MED ORDER — SODIUM CHLORIDE 0.9 % IJ SOLN
3.0000 mL | Freq: Two times a day (BID) | INTRAMUSCULAR | Status: DC
  Administered 2013-04-28: 3 mL via INTRAVENOUS

## 2013-04-28 MED ORDER — ENOXAPARIN SODIUM 40 MG/0.4ML ~~LOC~~ SOLN
40.0000 mg | Freq: Every day | SUBCUTANEOUS | Status: DC
  Administered 2013-04-28 – 2013-04-29 (×2): 40 mg via SUBCUTANEOUS
  Filled 2013-04-28 (×3): qty 0.4

## 2013-04-28 MED ORDER — SODIUM CHLORIDE 0.9 % IV BOLUS (SEPSIS)
1000.0000 mL | Freq: Once | INTRAVENOUS | Status: AC
  Administered 2013-04-28: 1000 mL via INTRAVENOUS

## 2013-04-28 MED ORDER — ONDANSETRON HCL 4 MG/2ML IJ SOLN: 4.0000 mg | Freq: Four times a day (QID) | INTRAMUSCULAR | Status: DC | PRN

## 2013-04-28 MED ORDER — GABAPENTIN 300 MG PO CAPS
300.0000 mg | ORAL_CAPSULE | Freq: Three times a day (TID) | ORAL | Status: DC
  Administered 2013-04-28 – 2013-04-30 (×7): 300 mg via ORAL
  Filled 2013-04-28 (×5): qty 1
  Filled 2013-04-28: qty 3
  Filled 2013-04-28: qty 1

## 2013-04-28 MED ORDER — HYDROCODONE-ACETAMINOPHEN 10-325 MG PO TABS
ORAL_TABLET | ORAL | Status: AC
  Filled 2013-04-28: qty 2

## 2013-04-28 MED ORDER — HYDROMORPHONE HCL PF 1 MG/ML IJ SOLN
1.0000 mg | INTRAMUSCULAR | Status: DC | PRN
  Administered 2013-04-28 – 2013-04-30 (×9): 1 mg via INTRAVENOUS
  Filled 2013-04-28 (×9): qty 1

## 2013-04-28 MED ORDER — LISINOPRIL 10 MG PO TABS
10.0000 mg | ORAL_TABLET | Freq: Every day | ORAL | Status: DC
  Administered 2013-04-29 – 2013-04-30 (×2): 10 mg via ORAL
  Filled 2013-04-28 (×2): qty 1

## 2013-04-28 NOTE — ED Provider Notes (Signed)
CSN: [id number]     Arrival date & time 04/28/13  0867 History  This chart was scribed for Nat Christen, MD by Zettie Pho, ED Scribe. This patient was seen in room APA06/APA06 and the patient's care was started at 9:00 AM.    Chief Complaint  Patient presents with  . Facial Swelling   The history is provided by the patient. No language interpreter was used.   HPI Comments: Jonathan Gilmore is a 63 y.o. male who presents to the Emergency Department complaining of right-sided facial swelling onset about 6 days ago secondary to an unknown bite to the right, lower cheek. He reports squeezing the area at home with a small amount of bloody, purulent drainage. Patient states that he first noticed mild swelling to the area 5 days ago so he followed up with his PCP 4 days ago and received an injection of Rocephin and was started on a course of doxycyline. He states that the swelling has been progressively worsening and became more severe last night, now affecting the vision in his right eye. Patient has allergies to morphine, but states that he has been treated with Dilaudid in the past without allergic complication. Patient has a history of HTN and DM.   Past Medical History  Diagnosis Date  . Hypertension   . Sleep apnea     tested at sleep apnea at [facility]  . Diabetes mellitus     since 1993  . Back pain     herniated disk L4-L5   Past Surgical History  Procedure Laterality Date  . Bone spur removed      back in 1997, left shoulder  . Hemorrhoid surgery    . Anterior cervical decomp/discectomy fusion  01/18/2011    Procedure: ANTERIOR CERVICAL DECOMPRESSION/DISCECTOMY FUSION 2 LEVELS;  Surgeon: Cooper Render Pool;  Location: [facility];  Service: Neurosurgery;  Laterality: N/A;  anterior cervical discectomy and fusion with allograft and plating, cervical five-six, cervical six-seven  . Carpal tunnel release      right   Family History  Problem Relation Age of Onset  . Cancer Mother   .  Diabetes Father   . Diabetes Sister   . Cushing syndrome Sister    History  Substance Use Topics  . Smoking status: Current Every Day Smoker -- 1.00 packs/day for 50 years    Types: Cigarettes  . Smokeless tobacco: Former Systems developer  . Alcohol Use: No    Review of Systems  A complete 10 system review of systems was obtained and all systems are negative except as noted in the HPI and PMH.    Allergies  Morphine and related  Home Medications   Current Outpatient Rx  Name  Route  Sig  Dispense  Refill  . aspirin EC 81 MG tablet   Oral   Take 81 mg by mouth daily.          Marland Kitchen atorvastatin (LIPITOR) 20 MG tablet   Oral   Take 1 tablet by mouth daily.         . colchicine 0.6 MG tablet   Oral   Take 1 tablet (0.6 mg total) by mouth 2 (two) times daily.   60 tablet   0   . cyclobenzaprine (FLEXERIL) 10 MG tablet   Oral   Take 10 mg by mouth 3 (three) times daily as needed for muscle spasms.         . Flaxseed, Linseed, (FLAXSEED OIL PO)   Oral  Take 1 tablet by mouth daily.          Marland Kitchen GABAPENTIN PO   Oral   Take 1 capsule by mouth 3 (three) times daily.         Marland Kitchen HYDROcodone-acetaminophen (NORCO) 10-325 MG per tablet   Oral   Take 2 tablets by mouth 5 (five) times daily.         Marland Kitchen ibuprofen (ADVIL,MOTRIN) 600 MG tablet   Oral   Take 1 tablet (600 mg total) by mouth 3 (three) times daily.   45 tablet   0   . lisinopril (PRINIVIL) 10 MG tablet   Oral   Take 1 tablet (10 mg total) by mouth daily.   30 tablet   0   . metFORMIN (GLUCOPHAGE) 500 MG tablet   Oral   Take 500 mg by mouth 3 (three) times daily with meals.          . Multiple Vitamin (MULTIVITAMIN WITH MINERALS) TABS   Oral   Take 1 tablet by mouth daily.         . nitroGLYCERIN (NITROSTAT) 0.4 MG SL tablet   Sublingual   Place 1 tablet (0.4 mg total) under the tongue every 5 (five) minutes as needed for chest pain.   25 tablet   4   . pantoprazole (PROTONIX) 40 MG tablet    Oral   Take 1 tablet (40 mg total) by mouth daily.   30 tablet   0    Triage Vitals: BP 168/89  Pulse 108  Temp(Src) 98.8 F (37.1 C) (Oral)  Resp 18  Ht 5\' 7"  (1.702 m)  Wt 230 lb (104.327 kg)  BMI 36.01 kg/m2  SpO2 96%  Physical Exam  Nursing note and vitals reviewed. Constitutional: He is oriented to person, place, and time. He appears well-developed and well-nourished.  HENT:  Head: Normocephalic and atraumatic.  Right cheek is edematous, erythematous, and indurated that extends to the right periorbital region. Central excoriated area to the right, lower cheek surrounding the initial bite area.   Eyes: Conjunctivae and EOM are normal. Pupils are equal, round, and reactive to light.  Neck: Normal range of motion. Neck supple.  Cardiovascular: Normal rate, regular rhythm and normal heart sounds.   Pulmonary/Chest: Effort normal and breath sounds normal.  Abdominal: Soft. Bowel sounds are normal.  Musculoskeletal: Normal range of motion.  Neurological: He is alert and oriented to person, place, and time.  Skin: Skin is warm and dry. There is erythema.  Psychiatric: He has a normal mood and affect. His behavior is normal.    ED Course  Procedures (including critical care time)  DIAGNOSTIC STUDIES: Oxygen Saturation is 96% on room air, norma by my interpretation.    COORDINATION OF CARE: 9:03 AM- Discussed that symptoms are likely due to an infection. Will order IV Vancomycin, Zofran, and Dilaudid to manage symptoms. Will order blood labs. Discussed that the patient may need to be admitted to the hospital for further evaluation and treatment. Discussed treatment plan with patient at bedside and patient verbalized agreement.   Results for orders placed during the hospital encounter of 28/41/32  BASIC METABOLIC PANEL      Result Value Ref Range   Sodium 134 (*) 137 - 147 mEq/L   Potassium 3.9  3.7 - 5.3 mEq/L   Chloride 93 (*) 96 - 112 mEq/L   CO2 29  19 - 32 mEq/L    Glucose, Bld 282 (*) 70 - 99 mg/dL  BUN 9  6 - 23 mg/dL   Creatinine, Ser 0.50  0.50 - 1.35 mg/dL   Calcium 9.1  8.4 - 10.5 mg/dL   GFR calc non Af Amer >90  >90 mL/min   GFR calc Af Amer >90  >90 mL/min  CBC WITH DIFFERENTIAL      Result Value Ref Range   WBC 12.9 (*) 4.0 - 10.5 K/uL   RBC 4.92  4.22 - 5.81 MIL/uL   Hemoglobin 15.4  13.0 - 17.0 g/dL   HCT 45.3  39.0 - 52.0 %   MCV 92.1  78.0 - 100.0 fL   MCH 31.3  26.0 - 34.0 pg   MCHC 34.0  30.0 - 36.0 g/dL   RDW 13.1  11.5 - 15.5 %   Platelets 251  150 - 400 K/uL   Neutrophils Relative % 69  43 - 77 %   Neutro Abs 8.8 (*) 1.7 - 7.7 K/uL   Lymphocytes Relative 22  12 - 46 %   Lymphs Abs 2.8  0.7 - 4.0 K/uL   Monocytes Relative 7  3 - 12 %   Monocytes Absolute 0.9  0.1 - 1.0 K/uL   Eosinophils Relative 2  0 - 5 %   Eosinophils Absolute 0.3  0.0 - 0.7 K/uL   Basophils Relative 1  0 - 1 %   Basophils Absolute 0.1  0.0 - 0.1 K/uL    EKG Interpretation   None       MDM   Final diagnoses:  None    Patient has obvious cellulitis/abscess of right facial area.  He has failed outpatient management.  Rx IV vancomycin and cefepime.  CT maxillofacial pending.  I personally performed the services described in this documentation, which was scribed in my presence. The recorded information has been reviewed and is accurate.     Nat Christen, MD 04/28/13 1041

## 2013-04-28 NOTE — ED Notes (Signed)
Pt states slight swelling was first noticed on Tuesday. Went to PMD on Wednesday and was told to come to the hospital. Pt states swelling became worse overnight, affecting vision to right eye. Severe swelling to right side of face. Denies difficulty breathing.

## 2013-04-28 NOTE — H&P (Signed)
[facility] History and Physical  DEVARIUS NELLES DPO:[id number] DOB: 1951/03/05 DOA: 04/28/2013  Referring physician:  PCP: Octavio Graves, DO  Specialists:   Chief Complaint: Facial swelling  HPI: Jonathan Gilmore is a 63 y.o. male  With a history of diabetes, hypertension that presents emergency department for facial swelling. Patient thinks he was bitten by something however did not feel anything. His facial swelling started approximately 6 days ago. Patient noticed a small abrasion on the right corner of his lip. Swelling then began to worsen. Patient saw his primary care physician approximately 4 days ago and received an injection of Rocephin and was started on a course of doxycycline. His swelling did progressively worsen and became more severe last night. Patient decided to squeeze the area, and the only was able to drain a small amount of blood with pus. Patient does state that his vision is now affected as his cheek is so swollen he cannot seem to keep his lower eyelid open. Patient denies any problems breathing, or eating or swallowing. Denies any recent travel, or sick contacts. Patient denies any fever or chills.  Review of Systems:  Constitutional: Denies fever, chills, diaphoresis, appetite change and fatigue.  HEENT: Complains of facial swelling. Respiratory: Denies SOB, DOE, cough, chest tightness,  and wheezing.   Cardiovascular: Denies chest pain, palpitations and leg swelling.  Gastrointestinal: Denies nausea, vomiting, abdominal pain, diarrhea, constipation, blood in stool and abdominal distention.  Genitourinary: Denies dysuria, urgency, frequency, hematuria, flank pain and difficulty urinating.  Musculoskeletal: Denies myalgias, back pain, joint swelling, arthralgias and gait problem.  Skin: Denies pallor, rash and wound.  Neurological: Denies dizziness, seizures, syncope, weakness, light-headedness, numbness and headaches.  Hematological: Denies adenopathy. Easy  bruising, personal or family bleeding history  Psychiatric/Behavioral: Denies suicidal ideation, mood changes, confusion, nervousness, sleep disturbance and agitation  Past Medical History  Diagnosis Date  . Hypertension   . Sleep apnea     tested at sleep apnea at [facility]  . Diabetes mellitus     since 1993  . Back pain     herniated disk L4-L5   Past Surgical History  Procedure Laterality Date  . Bone spur removed      back in 1997, left shoulder  . Hemorrhoid surgery    . Anterior cervical decomp/discectomy fusion  01/18/2011    Procedure: ANTERIOR CERVICAL DECOMPRESSION/DISCECTOMY FUSION 2 LEVELS;  Surgeon: Cooper Render Pool;  Location: [facility];  Service: Neurosurgery;  Laterality: N/A;  anterior cervical discectomy and fusion with allograft and plating, cervical five-six, cervical six-seven  . Carpal tunnel release      right   Social History:  reports that he has been smoking Cigarettes.  He has a 50 pack-year smoking history. He has quit using smokeless tobacco. He reports that he does not drink alcohol or use illicit drugs.   Allergies  Allergen Reactions  . Morphine And Related Nausea And Vomiting    Family History  Problem Relation Age of Onset  . Cancer Mother   . Diabetes Father   . Diabetes Sister   . Cushing syndrome Sister     Prior to Admission medications   Medication Sig Start Date End Date Taking? Authorizing Provider  aspirin EC 81 MG tablet Take 81 mg by mouth daily.    Yes Historical Provider, MD  cyclobenzaprine (FLEXERIL) 10 MG tablet Take 10 mg by mouth 3 (three) times daily as needed for muscle spasms.   Yes Historical Provider, MD  Flaxseed, Linseed, (  FLAXSEED OIL PO) Take 1 tablet by mouth daily.    Yes Historical Provider, MD  gabapentin (NEURONTIN) 300 MG capsule Take 300 mg by mouth 3 (three) times daily.   Yes Historical Provider, MD  HYDROcodone-acetaminophen (NORCO) 10-325 MG per tablet Take 1-2 tablets by mouth every 6 (six)  hours as needed for severe pain.    Yes Historical Provider, MD  metFORMIN (GLUCOPHAGE) 500 MG tablet Take 500 mg by mouth 3 (three) times daily with meals.    Yes Historical Provider, MD  Multiple Vitamin (MULTIVITAMIN WITH MINERALS) TABS Take 1 tablet by mouth daily.   Yes Historical Provider, MD  atorvastatin (LIPITOR) 20 MG tablet Take 1 tablet by mouth daily. 01/12/13   Historical Provider, MD  colchicine 0.6 MG tablet Take 1 tablet (0.6 mg total) by mouth 2 (two) times daily. 02/25/13   Samuella Cota, MD  lisinopril (PRINIVIL) 10 MG tablet Take 1 tablet (10 mg total) by mouth daily. 02/25/13   Samuella Cota, MD  nitroGLYCERIN (NITROSTAT) 0.4 MG SL tablet Place 1 tablet (0.4 mg total) under the tongue every 5 (five) minutes as needed for chest pain. 03/06/13   Lendon Colonel, NP   Physical Exam: Filed Vitals:   04/28/13 0954  BP: 126/65  Pulse: 97  Temp:   Resp: 18     General: Well developed, well nourished, NAD, appears stated age  1: Right sided facial swelling/erthyema, small wound noted on corner of right lip, PERRLA, EOMI, Anicteic Sclera, mucous membranes moist.  Neck: Supple, no JVD, no masses  Cardiovascular: S1 S2 auscultated,2/6 SEM, Regular rate and rhythm.  Respiratory: Clear to auscultation bilaterally with equal chest rise  Abdomen: Soft, obese, nontender, nondistended, + bowel sounds  Extremities: warm dry without cyanosis clubbing or edema  Neuro: AAOx3, cranial nerves grossly intact. Strength 5/5 in patient's upper and lower extremities bilaterally  Skin: Without rashes exudates or nodules, otherwise noted  Psych: Normal affect and demeanor with intact judgement and insight  Labs on Admission:  Basic Metabolic Panel:  Recent Labs Lab 04/28/13 0905  NA 134*  K 3.9  CL 93*  CO2 29  GLUCOSE 282*  BUN 9  CREATININE 0.50  CALCIUM 9.1   Liver Function Tests: No results found for this basename: AST, ALT, ALKPHOS, BILITOT, PROT,  ALBUMIN,  in the last 168 hours No results found for this basename: LIPASE, AMYLASE,  in the last 168 hours No results found for this basename: AMMONIA,  in the last 168 hours CBC:  Recent Labs Lab 04/28/13 0905  WBC 12.9*  NEUTROABS 8.8*  HGB 15.4  HCT 45.3  MCV 92.1  PLT 251   Cardiac Enzymes: No results found for this basename: CKTOTAL, CKMB, CKMBINDEX, TROPONINI,  in the last 168 hours  BNP (last 3 results) No results found for this basename: PROBNP,  in the last 8760 hours CBG: No results found for this basename: GLUCAP,  in the last 168 hours  Radiological Exams on Admission: No results found.   Assessment/Plan  Sepsis secondary to facial cellulitis Patient be admitted to medical floor. Patient made sepsis criteria as he does have an infection, is tachycardic with leukocytosis. Will start patient on IV vancomycin as well as cefepime to gram-negative and pseudomonal coverage. Will also give gentle hydration with IV fluids. Pending facial CT to evaluate for abscess.  Type 2 diabetes with neuropathy Will hold patient's metformin at this time. Will obtain hemoglobin A1c as well as place him on insulin sliding  scale with CBG monitoring. Will place him on modified diet. Will continue gabapentin for neuropathy. Will place patient on carb modified diet.  Mild hyponatremia and hypochloremia Will place patient on IV fluids. Will continue to monitor his BMP.  Hypertension Will continue lisinopril.  Hyperlipidemia Will continue atorvastatin.  Gout Will continue colchicine.  Back pain Will continue cyclobenzaprine oh as well as Norco when necessary.  Diastolic CHF Currently not volume overloaded. Echocardiogram conducted in December 2014 shows an EF of 55-60%, with a grade 1 diastolic dysfunction. Will monitor patient's intake and output, daily weights, and place him on a fluid restriction.  Tobacco dependence Counseled patient on smoking cessation for greater than 10  minutes. Will also order nicotine patch.  DVT prophylaxis: Lovenox  Code Status: Full  Condition: Guarded  Family Communication: None at bedside. Admission, patients condition and plan of care including tests being ordered have been discussed with the patient and who indicates understanding and agrees with the plan and Code Status.  Disposition Plan: Admitted   Time spent: 45 minutes  Jmari Pelc D.O. [facility] Pager [phone]  If 7PM-7AM, please contact night-coverage [email or web address] Password [facility] 04/28/2013, 10:54 AM

## 2013-04-28 NOTE — Progress Notes (Signed)
ANTIBIOTIC CONSULT NOTE  Pharmacy Consult for Vancomycin and Cefepime Indication: Facial Cellulitis / Abscess  Allergies  Allergen Reactions  . Morphine And Related Nausea And Vomiting    Patient Measurements: Height: 5\' 7"  (170.2 cm) Weight: 230 lb (104.327 kg) IBW/kg (Calculated) : 66.1  Vital Signs: Temp: 98.3 F (36.8 C) (02/14 1121) Temp src: Oral (02/14 0856) BP: 134/72 mmHg (02/14 1121) Pulse Rate: 103 (02/14 1121) Intake/Output from previous day:   Intake/Output from this shift:    Labs:  Recent Labs  04/28/13 0905  WBC 12.9*  HGB 15.4  PLT 251  CREATININE 0.50   Estimated Creatinine Clearance: 110.2 ml/min (by C-G formula based on Cr of 0.5). No results found for this basename: VANCOTROUGH, VANCOPEAK, VANCORANDOM, GENTTROUGH, GENTPEAK, GENTRANDOM, TOBRATROUGH, TOBRAPEAK, TOBRARND, AMIKACINPEAK, AMIKACINTROU, AMIKACIN,  in the last 72 hours   Microbiology: No results found for this or any previous visit (from the past 720 hour(s)).  Medical History: Past Medical History  Diagnosis Date  . Hypertension   . Sleep apnea     tested at [facility] apnea at [facility]  . Diabetes mellitus     since 1993  . Back pain     herniated disk L4-L5    Medications:  Prescriptions prior to admission  Medication Sig Dispense Refill  . aspirin EC 81 MG tablet Take 81 mg by mouth daily.       . cyclobenzaprine (FLEXERIL) 10 MG tablet Take 10 mg by mouth 3 (three) times daily as needed for muscle spasms.      . Flaxseed, Linseed, (FLAXSEED OIL PO) Take 1 tablet by mouth daily.       Marland Kitchen gabapentin (NEURONTIN) 300 MG capsule Take 300 mg by mouth 3 (three) times daily.      Marland Kitchen HYDROcodone-acetaminophen (NORCO) 10-325 MG per tablet Take 1-2 tablets by mouth every 6 (six) hours as needed for severe pain.       . metFORMIN (GLUCOPHAGE) 500 MG tablet Take 500 mg by mouth 3 (three) times daily with meals.       . Multiple Vitamin (MULTIVITAMIN WITH MINERALS) TABS Take 1  tablet by mouth daily.      Marland Kitchen atorvastatin (LIPITOR) 20 MG tablet Take 1 tablet by mouth daily.      . colchicine 0.6 MG tablet Take 1 tablet (0.6 mg total) by mouth 2 (two) times daily.  60 tablet  0  . lisinopril (PRINIVIL) 10 MG tablet Take 1 tablet (10 mg total) by mouth daily.  30 tablet  0  . nitroGLYCERIN (NITROSTAT) 0.4 MG SL tablet Place 1 tablet (0.4 mg total) under the tongue every 5 (five) minutes as needed for chest pain.  25 tablet  4   Assessment: Okay for Protocol, received initial Vancomycin dose in ED.  obesity/Normalized CrCl dosing  Normalized CrCL > 18ml/min  Vancomycin 2/14 >> Cefepime 2/14 >>  Goal of Therapy:  Vancomycin trough level 10-15 mcg/ml  Plan:  Cefepim 2gm IV every 12 hours. Vancomycin 2gm IV x 1 load, then 1gm IV every 12 hours. Measure antibiotic drug levels at steady state Follow up culture results  Pricilla Larsson 04/28/2013,11:54 AM

## 2013-04-28 NOTE — Progress Notes (Signed)
Patient requesting pain medication for facial pain.  Hydrocodone not due until 5:25 p.m.  Dr. Ree Kida notified via text page.

## 2013-04-29 DIAGNOSIS — A419 Sepsis, unspecified organism: Principal | ICD-10-CM

## 2013-04-29 DIAGNOSIS — I509 Heart failure, unspecified: Secondary | ICD-10-CM

## 2013-04-29 DIAGNOSIS — M109 Gout, unspecified: Secondary | ICD-10-CM

## 2013-04-29 DIAGNOSIS — I5030 Unspecified diastolic (congestive) heart failure: Secondary | ICD-10-CM

## 2013-04-29 LAB — CBC
HCT: 39.9 % (ref 39.0–52.0)
Hemoglobin: 13 g/dL (ref 13.0–17.0)
MCH: 30.1 pg (ref 26.0–34.0)
MCHC: 32.6 g/dL (ref 30.0–36.0)
MCV: 92.4 fL (ref 78.0–100.0)
Platelets: 198 10*3/uL (ref 150–400)
RBC: 4.32 MIL/uL (ref 4.22–5.81)
RDW: 12.9 % (ref 11.5–15.5)
WBC: 10.3 10*3/uL (ref 4.0–10.5)

## 2013-04-29 LAB — GLUCOSE, CAPILLARY
GLUCOSE-CAPILLARY: 184 mg/dL — AB (ref 70–99)
GLUCOSE-CAPILLARY: 275 mg/dL — AB (ref 70–99)
Glucose-Capillary: 248 mg/dL — ABNORMAL HIGH (ref 70–99)
Glucose-Capillary: 314 mg/dL — ABNORMAL HIGH (ref 70–99)

## 2013-04-29 LAB — BASIC METABOLIC PANEL
BUN: 8 mg/dL (ref 6–23)
CALCIUM: 8.6 mg/dL (ref 8.4–10.5)
CO2: 31 meq/L (ref 19–32)
CREATININE: 0.47 mg/dL — AB (ref 0.50–1.35)
Chloride: 96 mEq/L (ref 96–112)
GFR calc Af Amer: >90 mL/min (ref >90)
GLUCOSE: 275 mg/dL — AB (ref 70–99)
Potassium: 3.9 mEq/L (ref 3.7–5.3)
Sodium: 136 mEq/L — ABNORMAL LOW (ref 137–147)

## 2013-04-29 NOTE — Progress Notes (Signed)
Utilization review Completed Maddeline Roorda RN BSN   

## 2013-04-29 NOTE — Progress Notes (Signed)
[facility]                                                                              Patient Demographics  Jonathan Gilmore, is a 63 y.o. male, DOB - 08/09/1950, VQQ:[id number]  Admit date - 04/28/2013   Admitting Physician Edsel Petrin, DO  Outpatient Primary MD for the patient is [name], [name], DO  LOS - 1   Chief Complaint  Patient presents with  . Facial Swelling        Assessment & Plan   Sepsis secondary to facial cellulitis  -Improving, leukocytosis resolved, no longer tachycardic -Edema and erythema improving -CT maxillofacial: Extensive swelling consistent with cellulitis, negative for abscess or foreign body  -Continue IV Vancomycin and cefepime  Type 2 diabetes with neuropathy  -Metformin held -Hemoglobin A1c 10.6 -Continue ISS and CBG monitoring along with modified diet -Continue gabapentin for neuropathy  Mild hyponatremia and hypochloremia  -Improving, continue IVF  Hypertension  -Continue lisinopril.   Hyperlipidemia  -Continue atorvastatin.   Gout  -Continue colchicine.   Back pain  -Continue cyclobenzaprine oh as well as Norco when necessary.   Diastolic CHF  -Currently not volume overloaded.  -Echocardiogram conducted in December 2014 shows an EF of 55-60%, with a grade 1 diastolic dysfunction.  -Continue monitoring patient's intake and output, daily weights, and  fluid restriction.   Tobacco dependence  -Recommended smoking cessation. -Continue nicotine patch.  Code Status: Full  Family Communication: None at bedside  Disposition Plan: Admitted  Time Spent in minutes   30 minutes  Procedures None  Consults  None  DVT Prophylaxis  Lovenox   Lab Results  Component Value Date   PLT 198 04/29/2013    Medications  Scheduled Meds: . aspirin EC  81 mg Oral Daily  . atorvastatin  20 mg Oral Daily  . ceFEPime (MAXIPIME) IV  2 g Intravenous Q12H  . colchicine  0.6 mg Oral BID  . enoxaparin (LOVENOX) injection  40  mg Subcutaneous Daily  . gabapentin  300 mg Oral TID  . insulin aspart  0-15 Units Subcutaneous TID WC  . lisinopril  10 mg Oral Daily  . multivitamin with minerals  1 tablet Oral Daily  . nicotine  21 mg Transdermal Daily  . sodium chloride  3 mL Intravenous Q12H  . vancomycin  1,000 mg Intravenous Q12H   Continuous Infusions: . sodium chloride 75 mL/hr at 04/29/13 0255   PRN Meds:.sodium chloride, acetaminophen, acetaminophen, cyclobenzaprine, HYDROcodone-acetaminophen, HYDROmorphone (DILAUDID) injection, nitroGLYCERIN, ondansetron (ZOFRAN) IV, ondansetron, sodium chloride  Antibiotics   Anti-infectives   Start     Dose/Rate Route Frequency Ordered Stop   04/28/13 2200  vancomycin (VANCOCIN) IVPB 1000 mg/200 mL premix     1,000 mg 200 mL/hr over 60 Minutes Intravenous Every 12 hours 04/28/13 1410     04/28/13 1300  ceFEPIme (MAXIPIME) 2 g in dextrose 5 % 50 mL IVPB     2 g 100 mL/hr over 30 Minutes Intravenous Every 12 hours 04/28/13 1154     04/28/13 1100  vancomycin (VANCOCIN) 2,000 mg in sodium chloride 0.9 % 500 mL IVPB     2,000 mg 250 mL/hr over 120 Minutes Intravenous  Once 04/28/13 0959 04/28/13 1250        Subjective:   Pena Pobre seen and examined today.  Patient is now able to keep his eyelids open.  He feels his swelling has improved.  He states he must be discharged tomorrow for work.  Objective:   Filed Vitals:   04/28/13 1157 04/28/13 1544 04/28/13 2133 04/29/13 0429  BP: 129/65 141/76 144/70 149/84  Pulse: 97 92 106 97  Temp: 98.6 F (37 C) 98.4 F (36.9 C) 98.6 F (37 C) 98.3 F (36.8 C)  TempSrc: Oral Oral Oral Oral  Resp: 23 20 20 20   Height: 5\' 7"  (1.702 m)     Weight: 103.6 kg (228 lb 6.3 oz)   105 kg (231 lb 7.7 oz)  SpO2: 93% 96% 97% 93%    Wt Readings from Last 3 Encounters:  04/29/13 105 kg (231 lb 7.7 oz)  03/26/13 103.42 kg (228 lb)  03/06/13 104.327 kg (230 lb)     Intake/Output Summary (Last 24 hours) at 04/29/13 1013 Last  data filed at 04/29/13 0600  Gross per 24 hour  Intake   1710 ml  Output      0 ml  Net   1710 ml    Exam General: Well developed, well nourished, NAD, appears stated age  19: [place]/AT, Right sided facial swelling/erthyema-improving, small wound noted on corner of right lip, mucous membranes moist.  Neck: Supple, no JVD, no masses  Cardiovascular: S1 S2 auscultated,2/6 SEM, Regular rate and rhythm.  Respiratory: Clear to auscultation bilaterally with equal chest rise  Abdomen: Soft, obese, nontender, nondistended, + bowel sounds  Extremities: warm dry without cyanosis clubbing or edema  Neuro: AAOx3, No focal deficits Skin: Without rashes exudates or nodules, otherwise noted  Psych: Normal affect and demeanor with intact judgement and insight  Data Review   Micro Results No results found for this or any previous visit (from the past 240 hour(s)).  Radiology Reports Ct Maxillofacial W/cm  04/28/2013   CLINICAL DATA:  Right cheek pain after a spider bite.  EXAM: CT MAXILLOFACIAL WITH CONTRAST  TECHNIQUE: Multidetector CT imaging of the maxillofacial structures was performed with intravenous contrast. Multiplanar CT image reconstructions were also generated. A small metallic BB was placed on the right temple in order to reliably differentiate right from left.  CONTRAST:  80 mL OMNIPAQUE IOHEXOL 300 MG/ML  SOLN  COMPARISON:  Cervical spine CT scan 09/10/2011.  FINDINGS: There is extensive infiltration of subcutaneous fat about the right side of the face consistent with cellulitis. Intense appearing preseptal swelling is also seen about the left eye, worse laterally. No focal fluid collection is identified. The globes are intact and the lenses are located. Orbital fat is clear.  Scattered small lymph nodes are seen but there are no pathologically enlarged lymph nodes by CT size criteria. The aerodigestive tract is unremarkable. Major salivary glands appear normal. Mucosal thickening is seen in  the maxillary sinuses bilaterally with short air-fluid levels present. Scattered ethmoid air cell disease is also noted. Imaged intracranial contents are unremarkable. Degenerative change about the C1-2 articulation with a likely loose body off the anterior arch of C1 versus remote trauma appears unchanged.  IMPRESSION: Extensive swelling consistent with cellulitis about the left side of the face with preseptal swelling about the left eye also identified. Negative for abscess or foreign body.  Mucosal thickening in the maxillary sinuses with air-fluid levels present compatible with acute sinusitis.   Electronically Signed   By: Marcello Moores  Dalessio M.D.   On: 04/28/2013 11:36    CBC  Recent Labs Lab 04/28/13 0905 04/29/13 0608  WBC 12.9* 10.3  HGB 15.4 13.0  HCT 45.3 39.9  PLT 251 198  MCV 92.1 92.4  MCH 31.3 30.1  MCHC 34.0 32.6  RDW 13.1 12.9  LYMPHSABS 2.8  --   MONOABS 0.9  --   EOSABS 0.3  --   BASOSABS 0.1  --     Chemistries   Recent Labs Lab 04/28/13 0905 04/29/13 0608  NA 134* 136*  K 3.9 3.9  CL 93* 96  CO2 29 31  GLUCOSE 282* 275*  BUN 9 8  CREATININE 0.50 0.47*  CALCIUM 9.1 8.6   ------------------------------------------------------------------------------------------------------------------ estimated creatinine clearance is 110.6 ml/min (by C-G formula based on Cr of 0.47). ------------------------------------------------------------------------------------------------------------------  Recent Labs  04/28/13 0905  HGBA1C 10.6*   ------------------------------------------------------------------------------------------------------------------ No results found for this basename: CHOL, HDL, LDLCALC, TRIG, CHOLHDL, LDLDIRECT,  in the last 72 hours ------------------------------------------------------------------------------------------------------------------ No results found for this basename: TSH, T4TOTAL, FREET3, T3FREE, THYROIDAB,  in the last 72  hours ------------------------------------------------------------------------------------------------------------------ No results found for this basename: VITAMINB12, FOLATE, FERRITIN, TIBC, IRON, RETICCTPCT,  in the last 72 hours  Coagulation profile No results found for this basename: INR, PROTIME,  in the last 168 hours  No results found for this basename: DDIMER,  in the last 72 hours  Cardiac Enzymes No results found for this basename: CK, CKMB, TROPONINI, MYOGLOBIN,  in the last 168 hours ------------------------------------------------------------------------------------------------------------------ No components found with this basename: POCBNP,     Larance Ratledge D.O. on 04/29/2013 at 10:13 AM  Between 7am to 7pm - Pager - [phone]  After 7pm go to [email or web address] - password [email or web address]  And look for the night coverage person covering for me after hours  [facility] Office  [phone]

## 2013-04-30 DIAGNOSIS — L03211 Cellulitis of face: Secondary | ICD-10-CM

## 2013-04-30 DIAGNOSIS — L0201 Cutaneous abscess of face: Secondary | ICD-10-CM

## 2013-04-30 LAB — BASIC METABOLIC PANEL
BUN: 8 mg/dL (ref 6–23)
CALCIUM: 8.8 mg/dL (ref 8.4–10.5)
CO2: 30 meq/L (ref 19–32)
Chloride: 99 mEq/L (ref 96–112)
Creatinine, Ser: 0.49 mg/dL — ABNORMAL LOW (ref 0.50–1.35)
GFR calc Af Amer: >90 mL/min (ref >90)
Glucose, Bld: 257 mg/dL — ABNORMAL HIGH (ref 70–99)
Potassium: 3.9 mEq/L (ref 3.7–5.3)
Sodium: 141 mEq/L (ref 137–147)

## 2013-04-30 LAB — GLUCOSE, CAPILLARY: Glucose-Capillary: 260 mg/dL — ABNORMAL HIGH (ref 70–99)

## 2013-04-30 MED ORDER — AMOXICILLIN-POT CLAVULANATE 875-125 MG PO TABS: 1.0000 | ORAL_TABLET | Freq: Two times a day (BID) | ORAL | Status: DC

## 2013-04-30 MED ORDER — NICOTINE 21 MG/24HR TD PT24: 21.0000 mg | MEDICATED_PATCH | Freq: Every day | TRANSDERMAL | Status: DC

## 2013-04-30 MED ORDER — SULFAMETHOXAZOLE-TRIMETHOPRIM 400-80 MG PO TABS: 2.0000 | ORAL_TABLET | Freq: Two times a day (BID) | ORAL | Status: DC

## 2013-04-30 MED ORDER — SULFAMETHOXAZOLE-TMP DS 800-160 MG PO TABS: 2.0000 | ORAL_TABLET | Freq: Two times a day (BID) | ORAL | Status: DC

## 2013-04-30 NOTE — Progress Notes (Signed)
Inpatient Diabetes Program Recommendations  AACE/ADA: New Consensus Statement on Inpatient Glycemic Control (2013)  Target Ranges:  Prepandial:   less than 140 mg/dL      Peak postprandial:   less than 180 mg/dL (1-2 hours)      Critically ill patients:  140 - 180 mg/dL   Reason for Assessmentt: Hyperglycemia  Diabetes history: Type 2 diabetes.  A1C 10.6 (mean glucose of 258 mg/dl) Outpatient Diabetes medications: Metformin Current orders for Inpatient glycemic control: Novolog Moderate Correction Scale  Note:  Results for Jonathan Gilmore, Jonathan Gilmore (MRN [id number]) as of 04/30/2013 09:46  Ref. Range 04/29/2013 07:32 04/29/2013 11:42 04/29/2013 16:11 04/29/2013 22:30 04/30/2013 07:53  Glucose-Capillary Latest Range: 70-99 mg/dL 248 (H) 314 (H) 184 (H) 275 (H) 260 (H)   Telephone call to patient.  Informed him of Outpatient Diabetes Classes that are free of charge offered by dietitian Harless Litten, RD, LDN.  Patient receptive to notion of attending.  Informed by his nurse, Phineas Real, that he is being discharged.  FAXED a flyer regarding classes to his nurse, Latoya, and requested she give info to him.   Thank you.  Jermani Eberlein S. Marcelline Mates, RN, CNS, CDE Inpatient Diabetes Program, team pager [phone]

## 2013-04-30 NOTE — Discharge Summary (Signed)
Physician Discharge Summary  Jonathan Gilmore MRN:[id number] DOB: 05/18/1950 DOA: 04/28/2013  PCP: [name], [name], DO  Admit date: 04/28/2013 Discharge date: 04/30/2013  Time spent: 40 minutes  Recommendations for Outpatient Follow-up:  1. Has appointment with PCP 05/18/13 for evaluation of facial cellulitis. Recommend close OP follow up for optimal glycemic control as well.   Discharge Diagnoses:  Principal Problem:   Sepsis Active Problems:   Murmur   DM type 2 (diabetes mellitus, type 2)   HTN (hypertension)   Facial cellulitis   Hyperlipidemia   Tobacco dependence   Gout   Diastolic CHF   Discharge Condition: stable  Diet recommendation: carb modified  Filed Weights   04/28/13 1157 04/29/13 0429 04/30/13 0522  Weight: 103.6 kg (228 lb 6.3 oz) 105 kg (231 lb 7.7 oz) 103.6 kg (228 lb 6.3 oz)    History of present illness:  Jonathan Gilmore is a 63 y.o. male  With a history of diabetes, hypertension that presentrf emergency department on 04/28/13 for facial swelling. Patient reported he thought that  he was bitten by something however did not feel anything. His facial swelling started approximately 6 days prior. Patient noticed a small abrasion on the right corner of his lip. Swelling then began to worsen. Patient saw his primary care physician approximately 4 days prior and received an injection of Rocephin and was started on a course of doxycycline. His swelling did progressively worsen and became more severe. Patient decided to squeeze the area, and  was able to drain a small amount of blood with pus. Patient did state that his vision  now affected as his cheek is so swollen he could seem to keep his lower eyelid open. Patient denied any problems breathing, or eating or swallowing. Denied any recent travel, or sick contacts. Patient denied any fever or chills.   Hospital Course:  Sepsis secondary to facial cellulitis  - Admitted to medical floor. Sepsis criteria met on admission  with infection, tachycardia and leukocytosis. Started on IV vancomycin and cefepime for gram-negative and pseudomonal coverage. CT maxillofacial: Extensive swelling consistent with cellulitis, negative for abscess or foreign body. He quickly improved and on discharge he is afebrile with VSS and no leukocytosis. Will discharge with 7 day course of Bactrim DS and augmentin. He has follow up with PCP 05/18/13.  Type 2 diabetes with neuropathy  -Metformin held on admission. SSI used for control. Hemoglobin A1c 10.6  Will resume home regimen at discharge. CBG range 260's. Recommend close OP follow up with PCP for optimal glycemic control  Mild hyponatremia and hypochloremia  Resolved with IV fluids.   Hypertension  - controlled during this hospitalization. Continue lisinopril.  Hyperlipidemia  -Continue atorvastatin.  Gout  - stable at baseline during this hospitalization   Back pain  - Stable during this hospitalization.   Diastolic CHF  -Remained compensated. Echocardiogram conducted in December 2014 shows an EF of 55-60%, with a grade 1 diastolic dysfunction.    Tobacco dependence  -Recommended smoking cessation.        Procedures:  none  Consultations:  none  Discharge Exam: Filed Vitals:   04/30/13 0917  BP: 138/58  Pulse:   Temp:   Resp:     General: obese appears comfortable Cardiovascular: RRR No MGR No LE edema Respiratory: normal effort BS clear bilaterally no wheeze Skin: Right sided facial swelling mild erythema and dime sized wound with at corner mouth. No heat. Moderate tenderness. No heat  Discharge Instructions        Discharge Orders   Future Orders Complete By Expires   Diet - low sodium heart healthy  As directed    Discharge instructions  As directed    Comments:     Take medications as directed Keep follow up appointments as scheduled If symptoms fail to continue to improve or worsen seek immediate medical attention   Increase activity slowly  As  directed        Medication List         amoxicillin-clavulanate 875-125 MG per tablet  Commonly known as:  AUGMENTIN  Take 1 tablet by mouth 2 (two) times daily.     aspirin EC 81 MG tablet  Take 81 mg by mouth daily.     atorvastatin 20 MG tablet  Commonly known as:  LIPITOR  Take 1 tablet by mouth daily.     colchicine 0.6 MG tablet  Take 1 tablet (0.6 mg total) by mouth 2 (two) times daily.     cyclobenzaprine 10 MG tablet  Commonly known as:  FLEXERIL  Take 10 mg by mouth 3 (three) times daily as needed for muscle spasms.     FLAXSEED OIL PO  Take 1 tablet by mouth daily.     gabapentin 300 MG capsule  Commonly known as:  NEURONTIN  Take 300 mg by mouth 3 (three) times daily.     HYDROcodone-acetaminophen 10-325 MG per tablet  Commonly known as:  NORCO  Take 1-2 tablets by mouth every 6 (six) hours as needed for severe pain.     lisinopril 10 MG tablet  Commonly known as:  PRINIVIL  Take 1 tablet (10 mg total) by mouth daily.     metFORMIN 500 MG tablet  Commonly known as:  GLUCOPHAGE  Take 500 mg by mouth 3 (three) times daily with meals.     multivitamin with minerals Tabs tablet  Take 1 tablet by mouth daily.     nicotine 21 mg/24hr patch  Commonly known as:  NICODERM CQ - dosed in mg/24 hours  Place 1 patch (21 mg total) onto the skin daily.     nitroGLYCERIN 0.4 MG SL tablet  Commonly known as:  NITROSTAT  Place 1 tablet (0.4 mg total) under the tongue every 5 (five) minutes as needed for chest pain.     sulfamethoxazole-trimethoprim 800-160 MG per tablet  Commonly known as:  BACTRIM DS  Take 2 tablets by mouth 2 (two) times daily.       Allergies  Allergen Reactions  . Morphine And Related Nausea And Vomiting   Follow-up Information   Follow up with [place], [place], DO. Schedule an appointment as soon as possible for a visit in 1 week.   Contact information:   [place] [address] [phone]       Follow up On  05/18/2013. (appointment at 3pm)        The results of significant diagnostics from this hospitalization (including imaging, microbiology, ancillary and laboratory) are listed below for reference.    Significant Diagnostic Studies: Ct Maxillofacial W/cm  04/28/2013   CLINICAL DATA:  Right cheek pain after a spider bite.  EXAM: CT MAXILLOFACIAL WITH CONTRAST  TECHNIQUE: Multidetector CT imaging of the maxillofacial structures was performed with intravenous contrast. Multiplanar CT image reconstructions were also generated. A small metallic BB was placed on the right temple in order to reliably differentiate right from left.  CONTRAST:  80 mL OMNIPAQUE IOHEXOL 300 MG/ML  SOLN  COMPARISON:  Cervical spine CT  scan 09/10/2011.  FINDINGS: There is extensive infiltration of subcutaneous fat about the right side of the face consistent with cellulitis. Intense appearing preseptal swelling is also seen about the left eye, worse laterally. No focal fluid collection is identified. The globes are intact and the lenses are located. Orbital fat is clear.  Scattered small lymph nodes are seen but there are no pathologically enlarged lymph nodes by CT size criteria. The aerodigestive tract is unremarkable. Major salivary glands appear normal. Mucosal thickening is seen in the maxillary sinuses bilaterally with short air-fluid levels present. Scattered ethmoid air cell disease is also noted. Imaged intracranial contents are unremarkable. Degenerative change about the C1-2 articulation with a likely loose body off the anterior arch of C1 versus remote trauma appears unchanged.  IMPRESSION: Extensive swelling consistent with cellulitis about the left side of the face with preseptal swelling about the left eye also identified. Negative for abscess or foreign body.  Mucosal thickening in the maxillary sinuses with air-fluid levels present compatible with acute sinusitis.   Electronically Signed   By: Inge Rise M.D.   On:  04/28/2013 11:36    Microbiology: No results found for this or any previous visit (from the past 240 hour(s)).   Labs: Basic Metabolic Panel:  Recent Labs Lab 04/28/13 0905 04/29/13 0608 04/30/13 0522  NA 134* 136* 141  K 3.9 3.9 3.9  CL 93* 96 99  CO2 _0 GLUCOSE 282* 275* 257*  BUN _1 CREATININE 0.50 0.47* 0.49*  CALCIUM 9.1 8.6 8.8   Liver Function Tests: No results found for this basename: AST, ALT, ALKPHOS, BILITOT, PROT, ALBUMIN,  in the last 168 hours No results found for this basename: LIPASE, AMYLASE,  in the last 168 hours No results found for this basename: AMMONIA,  in the last 168 hours CBC:  Recent Labs Lab 04/28/13 0905 04/29/13 0608  WBC 12.9* 10.3  NEUTROABS 8.8*  --   HGB 15.4 13.0  HCT 45.3 39.9  MCV 92.1 92.4  PLT 251 198   Cardiac Enzymes: No results found for this basename: CKTOTAL, CKMB, CKMBINDEX, TROPONINI,  in the last 168 hours BNP: BNP (last 3 results) No results found for this basename: PROBNP,  in the last 8760 hours CBG:  Recent Labs Lab 04/29/13 0732 04/29/13 1142 04/29/13 1611 04/29/13 2230 04/30/13 0753  GLUCAP 248* 314* 184* 275* 260*       Signed:  Dyanne Carrel M  [facility] 04/30/2013, 3:46 PM

## 2013-04-30 NOTE — Discharge Summary (Signed)
Patient seen, independently examined and chart reviewed. I agree with exam, assessment and plan discussed with Dyanne Carrel, NP.   63 year old man with history of diabet and hypertension who presented with facial swelling ongoing for approximate 6 days prior to presentation. Treated with outpatient injection of Rocephin x1 and then a course of doxycycline without improvement. Admitted for facial cellulitis, possible sepsis.  Quickly improved per review of chart with empiric cefepime and vancomycin with resolution of leukocytosis and improving edema and erythema. CT of the maxillofacial region reveals extensive swelling consistent with cellulitis, negative for abscess or foreign body.   PMH  DM  HTN  Grade 1 diastolic dysfunction.  Tobacco dependence.   Subjective  He feels much better today. No complaints. He reports no visual difficulty.  Afebrile. Vital signs stable. Cardio vascular regular rate and rhythm. Respiratory clear to auscultation bilaterally. Face asymmetric with right-sided edema. Dry lesion crusted over at the corner of the right-side of the mouth. No exudate. The cheek subcutaneous tissue is indurated but nontender, no fluctuance. There is mild erythema. Right eye appears unremarkable. There is no swelling of the lids. Extraocular movements are intact.   He has shown me a picture on his cell phone which shows the cellulitis on admission which was clearly markedly worse at that time.   Capillary blood sugars stable. Basic metabolic panel unremarkable. CBC yesterday was unremarkable. Hemoglobin A1c 10.6. CT revealed extensive swelling consistent with cellulitis of the face and preseptal swelling left eyelids. No abscess or foreign body. Acute sinusitis.   Facial cellulitis appears to be improving rapidly. No evidence of orbital involvement. He appears to be improving clinically, subjectively and certainly in comparison to the photo on his cell phone. He failed outpatient doxycycline.   No culture data obtained. Plan empiric Bactrim and Augmentin. Instructed to return for fever or worsening of condition.  Murray Hodgkins, MD  [facility]  [phone]

## 2013-04-30 NOTE — Care Management Note (Signed)
    Page 1 of 1   04/30/2013     11:35:50 AM   CARE MANAGEMENT NOTE 04/30/2013  Patient:  Jonathan Gilmore, Jonathan Gilmore   Account Number:  [id number]  Date Initiated:  04/30/2013  Documentation initiated by:  Claretha Cooper  Subjective/Objective Assessment:   Pt lives at home with his significant other. Has spoken to the diabetic educator and will follow up in [place] with outpt education. 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     Action/Plan:   Anticipated DC Date:  04/30/2013   Anticipated DC Plan:  [place]  CM consult      Choice offered to / List presented to:             Status of service:  Completed, signed off Medicare Important Message given?   (If response is "NO", the following Medicare IM given date fields will be blank) Date Medicare IM given:   Date Additional Medicare IM given:    Discharge Disposition:  HOME/SELF CARE  Per UR Regulation:    If discussed at Long Length of Stay Meetings, dates discussed:    Comments:  04/30/13 Claretha Cooper RN BSN CM

## 2013-04-30 NOTE — Progress Notes (Signed)
Patient being d/c with prescriptions and instructions. Verbalizes understanding. IV cath removed and intact. No pain/swelling at site.

## 2013-09-19 ENCOUNTER — Encounter (HOSPITAL_COMMUNITY): Payer: Self-pay | Admitting: Emergency Medicine

## 2013-09-19 ENCOUNTER — Observation Stay (HOSPITAL_COMMUNITY)
Admission: EM | Admit: 2013-09-19 | Discharge: 2013-09-20 | Disposition: A | Discharge status: Home / Self Care | Payer: PRIVATE HEALTH INSURANCE | Attending: Family Medicine | Admitting: Family Medicine

## 2013-09-19 ENCOUNTER — Emergency Department (HOSPITAL_COMMUNITY): Payer: PRIVATE HEALTH INSURANCE

## 2013-09-19 DIAGNOSIS — F17200 Nicotine dependence, unspecified, uncomplicated: Secondary | ICD-10-CM | POA: Diagnosis present

## 2013-09-19 DIAGNOSIS — M4712 Other spondylosis with myelopathy, cervical region: Secondary | ICD-10-CM | POA: Diagnosis present

## 2013-09-19 DIAGNOSIS — G4730 Sleep apnea, unspecified: Secondary | ICD-10-CM | POA: Diagnosis not present

## 2013-09-19 DIAGNOSIS — G4733 Obstructive sleep apnea (adult) (pediatric): Secondary | ICD-10-CM | POA: Diagnosis present

## 2013-09-19 DIAGNOSIS — I10 Essential (primary) hypertension: Secondary | ICD-10-CM | POA: Insufficient documentation

## 2013-09-19 DIAGNOSIS — Z7982 Long term (current) use of aspirin: Secondary | ICD-10-CM | POA: Diagnosis not present

## 2013-09-19 DIAGNOSIS — R404 Transient alteration of awareness: Secondary | ICD-10-CM | POA: Diagnosis present

## 2013-09-19 DIAGNOSIS — I319 Disease of pericardium, unspecified: Secondary | ICD-10-CM | POA: Diagnosis present

## 2013-09-19 DIAGNOSIS — I5030 Unspecified diastolic (congestive) heart failure: Secondary | ICD-10-CM | POA: Diagnosis present

## 2013-09-19 DIAGNOSIS — E1169 Type 2 diabetes mellitus with other specified complication: Secondary | ICD-10-CM | POA: Diagnosis present

## 2013-09-19 DIAGNOSIS — E119 Type 2 diabetes mellitus without complications: Secondary | ICD-10-CM | POA: Diagnosis not present

## 2013-09-19 DIAGNOSIS — I2510 Atherosclerotic heart disease of native coronary artery without angina pectoris: Secondary | ICD-10-CM | POA: Insufficient documentation

## 2013-09-19 DIAGNOSIS — R55 Syncope and collapse: Principal | ICD-10-CM | POA: Insufficient documentation

## 2013-09-19 DIAGNOSIS — I519 Heart disease, unspecified: Secondary | ICD-10-CM | POA: Insufficient documentation

## 2013-09-19 DIAGNOSIS — G4710 Hypersomnia, unspecified: Secondary | ICD-10-CM | POA: Diagnosis present

## 2013-09-19 DIAGNOSIS — R011 Cardiac murmur, unspecified: Secondary | ICD-10-CM | POA: Diagnosis present

## 2013-09-19 DIAGNOSIS — I5032 Chronic diastolic (congestive) heart failure: Secondary | ICD-10-CM | POA: Diagnosis present

## 2013-09-19 DIAGNOSIS — E1100 Type 2 diabetes mellitus with hyperosmolarity without nonketotic hyperglycemic-hyperosmolar coma (NKHHC): Secondary | ICD-10-CM | POA: Diagnosis present

## 2013-09-19 DIAGNOSIS — E1159 Type 2 diabetes mellitus with other circulatory complications: Secondary | ICD-10-CM | POA: Diagnosis present

## 2013-09-19 DIAGNOSIS — Z79899 Other long term (current) drug therapy: Secondary | ICD-10-CM | POA: Insufficient documentation

## 2013-09-19 DIAGNOSIS — G47411 Narcolepsy with cataplexy: Secondary | ICD-10-CM

## 2013-09-19 HISTORY — DX: Other ill-defined heart diseases: I51.89

## 2013-09-19 HISTORY — DX: Carrier or suspected carrier of methicillin resistant Staphylococcus aureus: Z22.322

## 2013-09-19 HISTORY — DX: Abnormal result of other cardiovascular function study: R94.39

## 2013-09-19 HISTORY — DX: Atherosclerotic heart disease of native coronary artery without angina pectoris: I25.10

## 2013-09-19 LAB — CBC WITH DIFFERENTIAL/PLATELET
BASOS ABS: 0 10*3/uL (ref 0.0–0.1)
Basophils Relative: 1 % (ref 0–1)
EOS ABS: 0.2 10*3/uL (ref 0.0–0.7)
Eosinophils Relative: 3 % (ref 0–5)
HCT: 41.9 % (ref 39.0–52.0)
HEMOGLOBIN: 13.9 g/dL (ref 13.0–17.0)
Lymphocytes Relative: 41 % (ref 12–46)
Lymphs Abs: 3.1 10*3/uL (ref 0.7–4.0)
MCH: 30.6 pg (ref 26.0–34.0)
MCHC: 33.2 g/dL (ref 30.0–36.0)
MCV: 92.3 fL (ref 78.0–100.0)
MONO ABS: 0.5 10*3/uL (ref 0.1–1.0)
MONOS PCT: 7 % (ref 3–12)
Neutro Abs: 3.7 10*3/uL (ref 1.7–7.7)
Neutrophils Relative %: 48 % (ref 43–77)
Platelets: 222 10*3/uL (ref 150–400)
RBC: 4.54 MIL/uL (ref 4.22–5.81)
RDW: 13.1 % (ref 11.5–15.5)
WBC: 7.6 10*3/uL (ref 4.0–10.5)

## 2013-09-19 LAB — COMPREHENSIVE METABOLIC PANEL
ALBUMIN: 3.4 g/dL — AB (ref 3.5–5.2)
ALK PHOS: 85 U/L (ref 39–117)
ALT: 17 U/L (ref 0–53)
AST: 17 U/L (ref 0–37)
Anion gap: 9 (ref 5–15)
BUN: 12 mg/dL (ref 6–23)
CALCIUM: 9 mg/dL (ref 8.4–10.5)
CO2: 31 mEq/L (ref 19–32)
Chloride: 99 mEq/L (ref 96–112)
Creatinine, Ser: 0.59 mg/dL (ref 0.50–1.35)
GFR calc Af Amer: >90 mL/min (ref >90)
GFR calc non Af Amer: >90 mL/min (ref >90)
Glucose, Bld: 119 mg/dL — ABNORMAL HIGH (ref 70–99)
POTASSIUM: 4.6 meq/L (ref 3.7–5.3)
SODIUM: 139 meq/L (ref 137–147)
TOTAL PROTEIN: 6.6 g/dL (ref 6.0–8.3)
Total Bilirubin: 0.4 mg/dL (ref 0.3–1.2)

## 2013-09-19 LAB — ETHANOL

## 2013-09-19 LAB — GLUCOSE, CAPILLARY
GLUCOSE-CAPILLARY: 121 mg/dL — AB (ref 70–99)
GLUCOSE-CAPILLARY: 315 mg/dL — AB (ref 70–99)

## 2013-09-19 LAB — D-DIMER, QUANTITATIVE (NOT AT ARMC): D DIMER QUANT: 0.29 ug{FEU}/mL (ref 0.00–0.48)

## 2013-09-19 LAB — TROPONIN I: Troponin I: <0.3 ng/mL (ref <0.30)

## 2013-09-19 LAB — PROTIME-INR
INR: 0.95 (ref 0.00–1.49)
PROTHROMBIN TIME: 12.7 s (ref 11.6–15.2)

## 2013-09-19 LAB — SALICYLATE LEVEL: Salicylate Lvl: <2 mg/dL — ABNORMAL LOW (ref 2.8–20.0)

## 2013-09-19 LAB — ACETAMINOPHEN LEVEL: Acetaminophen (Tylenol), Serum: <15 ug/mL (ref 10–30)

## 2013-09-19 MED ORDER — ASPIRIN EC 81 MG PO TBEC
81.0000 mg | DELAYED_RELEASE_TABLET | Freq: Every day | ORAL | Status: DC
  Administered 2013-09-19 – 2013-09-20 (×2): 81 mg via ORAL
  Filled 2013-09-19 (×2): qty 1

## 2013-09-19 MED ORDER — INSULIN ASPART 100 UNIT/ML ~~LOC~~ SOLN
0.0000 [IU] | Freq: Three times a day (TID) | SUBCUTANEOUS | Status: DC
  Administered 2013-09-19: 1 [IU] via SUBCUTANEOUS
  Administered 2013-09-20 (×2): 2 [IU] via SUBCUTANEOUS

## 2013-09-19 MED ORDER — MUPIROCIN 2 % EX OINT
1.0000 "application " | TOPICAL_OINTMENT | Freq: Two times a day (BID) | CUTANEOUS | Status: DC
  Administered 2013-09-19 – 2013-09-20 (×2): 1 via NASAL
  Filled 2013-09-19: qty 22

## 2013-09-19 MED ORDER — MODAFINIL 200 MG PO TABS
200.0000 mg | ORAL_TABLET | Freq: Every day | ORAL | Status: DC
  Administered 2013-09-19 – 2013-09-20 (×2): 200 mg via ORAL
  Filled 2013-09-19 (×2): qty 1

## 2013-09-19 MED ORDER — NITROGLYCERIN 0.4 MG SL SUBL: 0.4000 mg | SUBLINGUAL_TABLET | SUBLINGUAL | Status: DC | PRN

## 2013-09-19 MED ORDER — INSULIN ASPART 100 UNIT/ML ~~LOC~~ SOLN
4.0000 [IU] | Freq: Three times a day (TID) | SUBCUTANEOUS | Status: DC
  Administered 2013-09-20 (×2): 4 [IU] via SUBCUTANEOUS

## 2013-09-19 MED ORDER — METFORMIN HCL 500 MG PO TABS
500.0000 mg | ORAL_TABLET | Freq: Three times a day (TID) | ORAL | Status: DC
  Administered 2013-09-19 – 2013-09-20 (×3): 500 mg via ORAL
  Filled 2013-09-19 (×3): qty 1

## 2013-09-19 MED ORDER — HEPARIN SODIUM (PORCINE) 5000 UNIT/ML IJ SOLN
5000.0000 [IU] | Freq: Three times a day (TID) | INTRAMUSCULAR | Status: DC
  Administered 2013-09-19 – 2013-09-20 (×3): 5000 [IU] via SUBCUTANEOUS
  Filled 2013-09-19 (×3): qty 1

## 2013-09-19 MED ORDER — SODIUM CHLORIDE 0.9 % IJ SOLN
3.0000 mL | Freq: Two times a day (BID) | INTRAMUSCULAR | Status: DC
  Administered 2013-09-19: 3 mL via INTRAVENOUS

## 2013-09-19 MED ORDER — HYDROCODONE-ACETAMINOPHEN 10-325 MG PO TABS
1.0000 | ORAL_TABLET | Freq: Four times a day (QID) | ORAL | Status: DC | PRN
  Administered 2013-09-19 – 2013-09-20 (×3): 1 via ORAL
  Filled 2013-09-19 (×3): qty 1

## 2013-09-19 NOTE — ED Notes (Signed)
Pt with multiple syncopal spells ongoing for 2 months, passed out x 3 this morning, pt denies CP or SOB

## 2013-09-19 NOTE — ED Notes (Addendum)
Received call back from floor a few minutes after calling report, informed that they need to switch pt's bed and they will call back as soon as they are able to accept pt too the room - Reasoning for delay relayed to pt and family.   Pt's wife Si Raider verbalized concern that pt is taking so much pain medications. Feels that his problems are mostly related to taking pain meds. Stated he has script for 120 tabs of 10/325mg  filled on 7/3 and pt has about 30 tabs left

## 2013-09-19 NOTE — ED Notes (Signed)
Pt dozing off and on , O2 sats noted to be dropping in to low 90's upper 80's . O2 via [place] @ 2l applied at this time .

## 2013-09-19 NOTE — Progress Notes (Signed)
[facility] History and Physical  LIEM COPENHAVER HGD:[id number] DOB: 1950/09/29 DOA: 09/19/2013  Referring physician: ED PCP: Octavio Graves, DO  Specialists: none  Chief Complaint: syncopy  HPI: 63 y/o ? known h/o ACDF Viral pericarditis, severe 3 v LAD CAd on Ct chest 02/25/13-Lexiscan 03/26/13 showed small ai infarct LVEF 37% showed, DM ty 2, HyperTG, esophagitis, Admission for facial cellulitis 2/14-2/16/15. It seems he's been having intermittent episodes of drops pulse. He sleeps well at night and doesn't really sleep much during the day. He sleeps at around 10 PM wakes up maybe once at 4 AM and sleeps a total of maybe 8-9 hours. Over the past 2 months is episodes where he has spells of 30 seconds to 2 minutes of "nodding off" wherein he would be in the process of talking, or doing something and then he would jump of his words and fall asleep. This happened about 6 times this morning and patient's girlfriend who is with him got concerned and called the primary care physician's office. They recommended he come to the emergency room for workup. He has not really had any blurred or double vision,any fever any chills any evidences of infection, any one-sided weakness, any cough or any cold His appetite has been fair but he has lost some weight recently intentionally. He carries a diagnosis of sleep apnea test done at [facility] and does not use bipap at night Lab work on admission unrevealing CT head without contrast showed no acute intracranial abnormalities Chest x-ray showed no acute cardiopulmonary issues EKG sinus rhythm PR interval 0.12 QRS axis 45 degrees, R wave progression   Review of Systems:   Past Medical History  Diagnosis Date  . Hypertension   . Sleep apnea     tested at sleep apnea at [facility]  . Diabetes mellitus     since 1993  . Back pain     herniated disk L4-L5  . MRSA (methicillin resistant staph aureus) culture positive   . Abnormal  cardiovascular stress test 03/2013  . Diastolic dysfunction   . Coronary artery disease    Past Surgical History  Procedure Laterality Date  . Bone spur removed      back in 1997, left shoulder  . Hemorrhoid surgery    . Anterior cervical decomp/discectomy fusion  01/18/2011    Procedure: ANTERIOR CERVICAL DECOMPRESSION/DISCECTOMY FUSION 2 LEVELS;  Surgeon: Cooper Render Pool;  Location: [facility];  Service: Neurosurgery;  Laterality: N/A;  anterior cervical discectomy and fusion with allograft and plating, cervical five-six, cervical six-seven  . Carpal tunnel release      right   Social History:  History   Social History Narrative  . No narrative on file    Allergies  Allergen Reactions  . Morphine And Related Nausea And Vomiting    Family History  Problem Relation Age of Onset  . Cancer Mother   . Diabetes Father   . Diabetes Sister   . Cushing syndrome Sister     Prior to Admission medications   Medication Sig Start Date End Date Taking? Authorizing Provider  aspirin EC 81 MG tablet Take 81 mg by mouth daily.    Yes Historical Provider, MD  atorvastatin (LIPITOR) 20 MG tablet Take 1 tablet by mouth daily. 01/12/13  Yes Historical Provider, MD  Flaxseed, Linseed, (FLAXSEED OIL PO) Take 1 tablet by mouth daily.    Yes Historical Provider, MD  gabapentin (NEURONTIN) 300 MG capsule Take 300 mg by mouth 3 (three) times  daily.   Yes Historical Provider, MD  HYDROcodone-acetaminophen (NORCO) 10-325 MG per tablet Take 1-2 tablets by mouth every 6 (six) hours as needed for severe pain.    Yes Historical Provider, MD  metFORMIN (GLUCOPHAGE) 500 MG tablet Take 500 mg by mouth 3 (three) times daily with meals.    Yes Historical Provider, MD  Multiple Vitamin (MULTIVITAMIN WITH MINERALS) TABS Take 1 tablet by mouth daily.   Yes Historical Provider, MD  mupirocin ointment (BACTROBAN) 2 % Place 1 application into the nose 2 (two) times daily.   Yes Historical Provider, MD  rifampin  (RIFADIN) 300 MG capsule Take 300 mg by mouth 2 (two) times daily. Started 09/18/13 for 10 days   Yes Historical Provider, MD  sulfamethoxazole-trimethoprim (BACTRIM DS) 800-160 MG per tablet Take 2 tablets by mouth 2 (two) times daily. For 10 days started on 09/14/13   Yes Historical Provider, MD  nitroGLYCERIN (NITROSTAT) 0.4 MG SL tablet Place 1 tablet (0.4 mg total) under the tongue every 5 (five) minutes as needed for chest pain. 03/06/13   Lendon Colonel, NP   Physical Exam: Filed Vitals:   09/19/13 1130 09/19/13 1200 09/19/13 1230  BP: 113/99 131/74 135/57  Pulse: 73 71 75  Resp: 20 13   SpO2: 91% 97% 97%     General:  Patient awake and arousable however gradually fall sleep while talking to him and will awaken so to start.  Eyes: Extraocular movements intact, vision by direct confrontation is normal  ENT: No JVD no bruit moderate dentition  Neck: No submandibular lymphadenopathy  Cardiovascular: S1-S2 no murmur rub or gallop  Respiratory: Clinically clear  Abdomen: Soft nontender nondistended no rebound  Skin: No lower extremity edema  Musculoskeletal: Range of motion intact  Psychiatric: Euthymic  Neurologic: Power 5/5 in biceps triceps, quadriceps hamstrings. Reflexes equivocal. Finger-nose-finger normal  Labs on Admission:  Basic Metabolic Panel:  Recent Labs Lab 09/19/13 1138  NA 139  K 4.6  CL 99  CO2 31  GLUCOSE 119*  BUN 12  CREATININE 0.59  CALCIUM 9.0   Liver Function Tests:  Recent Labs Lab 09/19/13 1138  AST 17  ALT 17  ALKPHOS 85  BILITOT 0.4  PROT 6.6  ALBUMIN 3.4*   No results found for this basename: LIPASE, AMYLASE,  in the last 168 hours No results found for this basename: AMMONIA,  in the last 168 hours CBC:  Recent Labs Lab 09/19/13 1233  WBC 7.6  NEUTROABS 3.7  HGB 13.9  HCT 41.9  MCV 92.3  PLT 222   Cardiac Enzymes:  Recent Labs Lab 09/19/13 1138  TROPONINI <0.30    BNP (last 3 results) No results found  for this basename: PROBNP,  in the last 8760 hours CBG: No results found for this basename: GLUCAP,  in the last 168 hours  Radiological Exams on Admission: Dg Chest 2 View  09/19/2013   CLINICAL DATA:  Hypertension, history of tobacco use  EXAM: CHEST  2 VIEW  COMPARISON:  01/08/2011  FINDINGS: The heart size and mediastinal contours are within normal limits. Both lungs are clear. The visualized skeletal structures show postsurgical change in the cervical spine  IMPRESSION: No active cardiopulmonary disease.   Electronically Signed   By: Inez Catalina M.D.   On: 09/19/2013 12:28   Ct Head Wo Contrast  09/19/2013   CLINICAL DATA:  Loss of consciousness, no known injury, history hypertension, diabetes, coronary artery disease with diastolic dysfunction  EXAM: CT HEAD WITHOUT CONTRAST  TECHNIQUE: Contiguous axial images were obtained from the base of the skull through the vertex without intravenous contrast.  COMPARISON:  None  FINDINGS: Normal ventricular morphology.  No midline shift or mass effect.  Normal appearance of brain parenchyma.  No intracranial hemorrhage, mass lesion, or acute infarction.  Visualized paranasal sinuses and mastoid air cells clear.  Bones unremarkable.  Atherosclerotic calcifications of internal carotid and vertebral arteries at skullbase.  IMPRESSION: No acute intracranial abnormalities.   Electronically Signed   By: Lavonia Dana M.D.   On: 09/19/2013 12:38    EKG: Independently reviewed. See above  Assessment/Plan Principal Problem:   Narcolepsy cataplexy syndrome-patient's findings more in keeping with narcolepsy then syncope. We will minimize opiates ECTs and other medications and potentially start modafinil 200 mg and see if this helps keep him awake. We'll observe him in the hospital and withdraw his opiates. His girlfriend mentions that he's been taking more of the pain medications and he usually does and this may have an effect on his excessive somnolence Active  Problems:   Cervical spondylosis with myelopathy-see above discussion regarding pain medications. In addition I will withdraw his gabapentin for now and slowly reimplement it prior to discharge from hospital   Pericarditis-stable currently   Severe LAD stenosis on CT scan chest-continue medical management-continue aspirin 81 mg daily. He currently is not on any pressure medications   DM type 2 (diabetes mellitus, type 2)-well controlled per his report. Last A1c however shows persistent 0.6. He claims that his regulations and his. We'll keep him on this scale coverage. He can continue his metformin 500 3 times a day with this-no need for long acting at this time   Tobacco dependence-monitor and discuss with the patient cessation   Diastolic CHF-stable at present time   OSA (obstructive sleep apnea)-may benefit from outpatient sleep titration/BiPAP therapy.  Full code Telemetry 65 minutes  Nita Sells [facility] Pager [phone]  If 7PM-7AM, please contact night-coverage [email or web address] Password [email or web address] 09/19/2013, 1:08 PM

## 2013-09-19 NOTE — ED Provider Notes (Signed)
CSN: [id number]     Arrival date & time 09/19/13  1100 History   First MD Initiated Contact with Patient 09/19/13 1105     Chief Complaint  Patient presents with  . Loss of Consciousness     HPI Pt was seen at 1125. Per pt and his family, c/o sudden onset and resolution of 3 episodes of brief syncope that occurred since this morning PTA. Pt does not recall events. Pt states he has had recurrent syncopal episodes for the past 2 months, but has not told his PMD about them. Denies prodromal symptoms before syncope, no seizure activity, no incont of bowel/bladder, no falls, no fevers, no focal motor weakness, no tingling/numnbess in extremities, no CP/palpitations, no SOB/cough, no abd pain, no N/V/D.      Past Medical History  Diagnosis Date  . Hypertension   . Sleep apnea     tested at sleep apnea at [facility]  . Diabetes mellitus     since 1993  . Back pain     herniated disk L4-L5  . MRSA (methicillin resistant staph aureus) culture positive   . Abnormal cardiovascular stress test 03/2013  . Diastolic dysfunction   . Coronary artery disease    Past Surgical History  Procedure Laterality Date  . Bone spur removed      back in 1997, left shoulder  . Hemorrhoid surgery    . Anterior cervical decomp/discectomy fusion  01/18/2011    Procedure: ANTERIOR CERVICAL DECOMPRESSION/DISCECTOMY FUSION 2 LEVELS;  Surgeon: Cooper Render Pool;  Location: [facility];  Service: Neurosurgery;  Laterality: N/A;  anterior cervical discectomy and fusion with allograft and plating, cervical five-six, cervical six-seven  . Carpal tunnel release      right   Family History  Problem Relation Age of Onset  . Cancer Mother   . Diabetes Father   . Diabetes Sister   . Cushing syndrome Sister    History  Substance Use Topics  . Smoking status: Current Every Day Smoker -- 1.00 packs/day for 50 years    Types: Cigarettes  . Smokeless tobacco: Former Systems developer  . Alcohol Use: No    Review of  Systems ROS: Statement: All systems negative except as marked or noted in the HPI; Constitutional: Negative for fever and chills. ; ; Eyes: Negative for eye pain, redness and discharge. ; ; ENMT: Negative for ear pain, hoarseness, nasal congestion, sinus pressure and sore throat. ; ; Cardiovascular: Negative for chest pain, palpitations, diaphoresis, dyspnea and peripheral edema. ; ; Respiratory: Negative for cough, wheezing and stridor. ; ; Gastrointestinal: Negative for nausea, vomiting, diarrhea, abdominal pain, blood in stool, hematemesis, jaundice and rectal bleeding. . ; ; Genitourinary: Negative for dysuria, flank pain and hematuria. ; ; Musculoskeletal: Negative for back pain and neck pain. Negative for swelling and trauma.; ; Skin: Negative for pruritus, rash, abrasions, blisters, bruising and skin lesion.; ; Neuro: Negative for headache, lightheadedness and neck stiffness. Negative for weakness, extremity weakness, paresthesias, involuntary movement, seizure and +syncope.      Allergies  Morphine and related  Home Medications   Prior to Admission medications   Medication Sig Start Date End Date Taking? Authorizing Provider  aspirin EC 81 MG tablet Take 81 mg by mouth daily.    Yes Historical Provider, MD  atorvastatin (LIPITOR) 20 MG tablet Take 1 tablet by mouth daily. 01/12/13  Yes Historical Provider, MD  Flaxseed, Linseed, (FLAXSEED OIL PO) Take 1 tablet by mouth daily.    Yes Historical Provider,  MD  gabapentin (NEURONTIN) 300 MG capsule Take 300 mg by mouth 3 (three) times daily.   Yes Historical Provider, MD  HYDROcodone-acetaminophen (NORCO) 10-325 MG per tablet Take 1-2 tablets by mouth every 6 (six) hours as needed for severe pain.    Yes Historical Provider, MD  metFORMIN (GLUCOPHAGE) 500 MG tablet Take 500 mg by mouth 3 (three) times daily with meals.    Yes Historical Provider, MD  Multiple Vitamin (MULTIVITAMIN WITH MINERALS) TABS Take 1 tablet by mouth daily.   Yes  Historical Provider, MD  mupirocin ointment (BACTROBAN) 2 % Place 1 application into the nose 2 (two) times daily.   Yes Historical Provider, MD  rifampin (RIFADIN) 300 MG capsule Take 300 mg by mouth 2 (two) times daily. Started 09/18/13 for 10 days   Yes Historical Provider, MD  sulfamethoxazole-trimethoprim (BACTRIM DS) 800-160 MG per tablet Take 2 tablets by mouth 2 (two) times daily. For 10 days started on 09/14/13   Yes Historical Provider, MD  nitroGLYCERIN (NITROSTAT) 0.4 MG SL tablet Place 1 tablet (0.4 mg total) under the tongue every 5 (five) minutes as needed for chest pain. 03/06/13   Lendon Colonel, NP   BP 113/99  Pulse 73  Resp 20  SpO2 91% Physical Exam 1130: Physical examination:  Nursing notes reviewed; Vital signs and O2 SAT reviewed;  Constitutional: Well developed, Well nourished, Well hydrated, In no acute distress; Head:  Normocephalic, atraumatic; Eyes: EOMI, PERRL, No scleral icterus; ENMT: Mouth and pharynx normal, Mucous membranes moist; Neck: Supple, Full range of motion, No lymphadenopathy; Cardiovascular: Regular rate and rhythm, No murmur, rub, or gallop; Respiratory: Breath sounds clear & equal bilaterally, No rales, rhonchi, wheezes.  Speaking full sentences with ease, Normal respiratory effort/excursion; Chest: Nontender, Movement normal; Abdomen: Soft, Nontender, Nondistended, Normal bowel sounds; Genitourinary: No CVA tenderness; Extremities: Pulses normal, No tenderness, No edema, No calf edema or asymmetry.; Neuro: AA&Ox3, Major CN grossly intact. No facial droop. Speech clear. No gross focal motor or sensory deficits in extremities.; Skin: Color normal, Warm, Dry.   ED Course  Procedures     EKG Interpretation   Date/Time:  Wednesday September 19 2013 11:21:51 EDT Ventricular Rate:  75 PR Interval:  180 QRS Duration: 79 QT Interval:  412 QTC Calculation: 460 R Axis:   50 Text Interpretation:  Sinus rhythm Left atrial enlargement Artifact When  compared  with ECG of 02/25/2013 No significant change was found Confirmed  by [facility]  MD, Nunzio Cory [phone]) on 09/19/2013 12:23:18 PM      MDM  MDM Reviewed: previous chart, nursing note and vitals Reviewed previous: labs and ECG Interpretation: labs, ECG, x-ray and CT scan    Results for orders placed during the hospital encounter of 09/19/13  COMPREHENSIVE METABOLIC PANEL      Result Value Ref Range   Sodium 139  137 - 147 mEq/L   Potassium 4.6  3.7 - 5.3 mEq/L   Chloride 99  96 - 112 mEq/L   CO2 31  19 - 32 mEq/L   Glucose, Bld 119 (*) 70 - 99 mg/dL   BUN 12  6 - 23 mg/dL   Creatinine, Ser 0.59  0.50 - 1.35 mg/dL   Calcium 9.0  8.4 - 10.5 mg/dL   Total Protein 6.6  6.0 - 8.3 g/dL   Albumin 3.4 (*) 3.5 - 5.2 g/dL   AST 17  0 - 37 U/L   ALT 17  0 - 53 U/L   Alkaline Phosphatase 85  39 - 117 U/L   Total Bilirubin 0.4  0.3 - 1.2 mg/dL   GFR calc non Af Amer >90  >90 mL/min   GFR calc Af Amer >90  >90 mL/min   Anion gap 9  5 - 15  SALICYLATE LEVEL      Result Value Ref Range   Salicylate Lvl <1.6 (*) 2.8 - 20.0 mg/dL  ACETAMINOPHEN LEVEL      Result Value Ref Range   Acetaminophen (Tylenol), Serum <15.0  10 - 30 ug/mL  ETHANOL      Result Value Ref Range   Alcohol, Ethyl (B) <11  0 - 11 mg/dL  D-DIMER, QUANTITATIVE      Result Value Ref Range   D-Dimer, Quant 0.29  0.00 - 0.48 ug/mL-FEU  TROPONIN I      Result Value Ref Range   Troponin I <0.30  <0.30 ng/mL  PROTIME-INR      Result Value Ref Range   Prothrombin Time 12.7  11.6 - 15.2 seconds   INR 0.95  0.00 - 1.49  CBC WITH DIFFERENTIAL      Result Value Ref Range   WBC 7.6  4.0 - 10.5 K/uL   RBC 4.54  4.22 - 5.81 MIL/uL   Hemoglobin 13.9  13.0 - 17.0 g/dL   HCT 41.9  39.0 - 52.0 %   MCV 92.3  78.0 - 100.0 fL   MCH 30.6  26.0 - 34.0 pg   MCHC 33.2  30.0 - 36.0 g/dL   RDW 13.1  11.5 - 15.5 %   Platelets 222  150 - 400 K/uL   Neutrophils Relative % 48  43 - 77 %   Neutro Abs 3.7  1.7 - 7.7 K/uL   Lymphocytes  Relative 41  12 - 46 %   Lymphs Abs 3.1  0.7 - 4.0 K/uL   Monocytes Relative 7  3 - 12 %   Monocytes Absolute 0.5  0.1 - 1.0 K/uL   Eosinophils Relative 3  0 - 5 %   Eosinophils Absolute 0.2  0.0 - 0.7 K/uL   Basophils Relative 1  0 - 1 %   Basophils Absolute 0.0  0.0 - 0.1 K/uL   Dg Chest 2 View 09/19/2013   CLINICAL DATA:  Hypertension, history of tobacco use  EXAM: CHEST  2 VIEW  COMPARISON:  01/08/2011  FINDINGS: The heart size and mediastinal contours are within normal limits. Both lungs are clear. The visualized skeletal structures show postsurgical change in the cervical spine  IMPRESSION: No active cardiopulmonary disease.   Electronically Signed   By: Inez Catalina M.D.   On: 09/19/2013 12:28   Ct Head Wo Contrast 09/19/2013   CLINICAL DATA:  Loss of consciousness, no known injury, history hypertension, diabetes, coronary artery disease with diastolic dysfunction  EXAM: CT HEAD WITHOUT CONTRAST  TECHNIQUE: Contiguous axial images were obtained from the base of the skull through the vertex without intravenous contrast.  COMPARISON:  None  FINDINGS: Normal ventricular morphology.  No midline shift or mass effect.  Normal appearance of brain parenchyma.  No intracranial hemorrhage, mass lesion, or acute infarction.  Visualized paranasal sinuses and mastoid air cells clear.  Bones unremarkable.  Atherosclerotic calcifications of internal carotid and vertebral arteries at skullbase.  IMPRESSION: No acute intracranial abnormalities.   Electronically Signed   By: Lavonia Dana M.D.   On: 09/19/2013 12:38    1305:  Pt is not orthostatic. Pt falls asleep easily while in the ED with  O2 Sats dropping into 80's. O2 N/C applied with O2 Sat increasing to 97%. Dx and testing d/w pt and family.  Questions answered.  Verb understanding, agreeable to admit. T/C to [facility] Dr. Verlon Au, case discussed, including:  HPI, pertinent PM/SHx, VS/PE, dx testing, ED course and treatment:  Agreeable to admit, requests to write  temporary orders, obtain observation tele bed to team 1.   Alfonzo Feller, DO 09/20/13 [phone]

## 2013-09-19 NOTE — Consult Note (Signed)
[place] A. Merlene Laughter, MD     [email or web address]          Jonathan Gilmore is an 63 y.o. male.   ASSESSMENT/PLAN: 1. Severe hypersomnia most likely due to severe untreated obstructive sleep apnea syndrome. The patient cannot be diagnosed with narcolepsy in the face of untreated severe obstructive sleep apnea syndrome which can cause severe hypersomnia. Patient will be treated vigorously for sleep apnea. We will try to obtain the previous sleep study but ultimately we will arrange for the patient to have another sleep study and appropriate treatment. In the interim, we will give the patient and AutoPap tonight. Additional labs will also be obtained Including tests for vitamin B12 level and thyroid function tests.  Patient is a 63 year old white male who has a diagnosis of obstructive sleep apnea syndrome. The patient was tested about 3 years ago at [facility]. He tells me that he has not been using his machine because of difficulties with his mask. Patient does report severe snoring, witnessed apnea and recently has had progressively worsening hypersomnia. The patient has had multiple episodes of sleep attacks in the daytime. The patient does not have cataplexy. He does not have sleep paralysis. He tells me that sometimes he may see some objects consistent with hallucinations but does not appears to be associated with sleep and not typical of hypnagogic hallucinations or hypnopompic hallucinations.He goes to bed at approximately 10 PM and sleeps until about 4 AM. It appears that the patient was diagnosed with sleep apnea clinically by his primary care provider because of hypersomnia but again the patient is not using his positive pressure machine for severe obstructive sleep apnea syndrome. The patient does not report having chest pain, dyspnea daytime, GI GU symptoms. Review of systems otherwise negative.  GENERAL: Patient is sleeping on entering the exam  room.  HEENT: Supple. Atraumatic normocephalic. He has a very large stocky neck, large tongue and crowded posterior air space that is quite reddened.  ABDOMEN: soft  EXTREMITIES: No edema   BACK: Normal.  SKIN: Normal by inspection.    MENTAL STATUS: Alert and oriented. Speech, language and cognition are generally intact. Judgment and insight normal.   CRANIAL NERVES: Pupils are equal, round and reactive to light and accommodation; extra ocular movements are full, there is no significant nystagmus; visual fields are full; upper and lower facial muscles are normal in strength and symmetric, there is no flattening of the nasolabial folds; tongue is midline; uvula is midline; shoulder elevation is normal.  MOTOR: Normal tone, bulk and strength; no pronator drift.  COORDINATION: Left finger to nose is normal, right finger to nose is normal, No rest tremor; no intention tremor; no postural tremor; no bradykinesia.  REFLEXES: Deep tendon reflexes are symmetrical and normal. Babinski reflexes are flexor bilaterally.   SENSATION: Normal to light touch.     Past Medical History  Diagnosis Date  . Hypertension   . Sleep apnea     tested at [facility] ap[facility] at [facility]  . Diabetes mellitus     since 1993  . Back pain     herniated disk L4-L5  . MRSA (methicillin resistant staph aureus) culture positive   . Abnormal cardiovascular stress test 03/2013  . Diastolic dysfunction   . Coronary artery disease     Past Surgical History  Procedure Laterality Date  . Bone spur removed      back in 1997, left shoulder  . Hemorrhoid surgery    .  Anterior cervical decomp/discectomy fusion  01/18/2011    Procedure: ANTERIOR CERVICAL DECOMPRESSION/DISCECTOMY FUSION 2 LEVELS;  Surgeon: Cooper Render Pool;  Location: [place] [facility];  Service: Neurosurgery;  Laterality: N/A;  anterior cervical discectomy and fusion with allograft and plating, cervical five-six, cervical six-seven  . Carpal tunnel  release      right    Family History  Problem Relation Age of Onset  . Cancer Mother   . Diabetes Father   . Diabetes Sister   . Cushing syndrome Sister     Social History:  reports that he has been smoking Cigarettes.  He has a 50 pack-year smoking history. He has quit using smokeless tobacco. He reports that he does not drink alcohol or use illicit drugs.  Allergies:  Allergies  Allergen Reactions  . Morphine And Related Nausea And Vomiting    Medications: Prior to Admission medications   Medication Sig Start Date End Date Taking? Authorizing Provider  aspirin EC 81 MG tablet Take 81 mg by mouth daily.    Yes Historical Provider, MD  atorvastatin (LIPITOR) 20 MG tablet Take 1 tablet by mouth daily. 01/12/13  Yes Historical Provider, MD  Flaxseed, Linseed, (FLAXSEED OIL PO) Take 1 tablet by mouth daily.    Yes Historical Provider, MD  gabapentin (NEURONTIN) 300 MG capsule Take 300 mg by mouth 3 (three) times daily.   Yes Historical Provider, MD  HYDROcodone-acetaminophen (NORCO) 10-325 MG per tablet Take 1-2 tablets by mouth every 6 (six) hours as needed for severe pain.    Yes Historical Provider, MD  metFORMIN (GLUCOPHAGE) 500 MG tablet Take 500 mg by mouth 3 (three) times daily with meals.    Yes Historical Provider, MD  Multiple Vitamin (MULTIVITAMIN WITH MINERALS) TABS Take 1 tablet by mouth daily.   Yes Historical Provider, MD  mupirocin ointment (BACTROBAN) 2 % Place 1 application into the nose 2 (two) times daily.   Yes Historical Provider, MD  rifampin (RIFADIN) 300 MG capsule Take 300 mg by mouth 2 (two) times daily. Started 09/18/13 for 10 days   Yes Historical Provider, MD  sulfamethoxazole-trimethoprim (BACTRIM DS) 800-160 MG per tablet Take 2 tablets by mouth 2 (two) times daily. For 10 days started on 09/14/13   Yes Historical Provider, MD  nitroGLYCERIN (NITROSTAT) 0.4 MG SL tablet Place 1 tablet (0.4 mg total) under the tongue every 5 (five) minutes as needed for chest  pain. 03/06/13   Lendon Colonel, NP    Scheduled Meds: . aspirin EC  81 mg Oral Daily  . heparin  5,000 Units Subcutaneous 3 times per day  . insulin aspart  0-9 Units Subcutaneous TID WC  . insulin aspart  4 Units Subcutaneous TID WC  . metFORMIN  500 mg Oral TID WC  . modafinil  200 mg Oral Daily  . mupirocin ointment  1 application Nasal BID  . sodium chloride  3 mL Intravenous Q12H   Continuous Infusions:  PRN Meds:.HYDROcodone-acetaminophen, nitroGLYCERIN   Blood pressure 141/65, pulse 68, temperature 98 F (36.7 C), resp. rate 16, height '5\' 7"'  (1.702 m), weight 97.523 kg (215 lb), SpO2 96.00%.   Results for orders placed during the hospital encounter of 09/19/13 (from the past 48 hour(s))  COMPREHENSIVE METABOLIC PANEL     Status: Abnormal   Collection Time    09/19/13 11:38 AM      Result Value Ref Range   Sodium 139  137 - 147 mEq/L   Potassium 4.6  3.7 - 5.3 mEq/L  Chloride 99  96 - 112 mEq/L   CO2 31  19 - 32 mEq/L   Glucose, Bld 119 (*) 70 - 99 mg/dL   BUN 12  6 - 23 mg/dL   Creatinine, Ser 0.59  0.50 - 1.35 mg/dL   Calcium 9.0  8.4 - 10.5 mg/dL   Total Protein 6.6  6.0 - 8.3 g/dL   Albumin 3.4 (*) 3.5 - 5.2 g/dL   AST 17  0 - 37 U/L   ALT 17  0 - 53 U/L   Alkaline Phosphatase 85  39 - 117 U/L   Total Bilirubin 0.4  0.3 - 1.2 mg/dL   GFR calc non Af Amer >90  >90 mL/min   GFR calc Af Amer >90  >90 mL/min   Comment: (NOTE)     The eGFR has been calculated using the CKD EPI equation.     This calculation has not been validated in all clinical situations.     eGFR's persistently <90 mL/min signify possible Chronic Kidney     Disease.   Anion gap 9  5 - 15  SALICYLATE LEVEL     Status: Abnormal   Collection Time    09/19/13 11:38 AM      Result Value Ref Range   Salicylate Lvl <1.3 (*) 2.8 - 20.0 mg/dL  ACETAMINOPHEN LEVEL     Status: None   Collection Time    09/19/13 11:38 AM      Result Value Ref Range   Acetaminophen (Tylenol), Serum <15.0  10  - 30 ug/mL   Comment:            THERAPEUTIC CONCENTRATIONS VARY     SIGNIFICANTLY. A RANGE OF 10-30     ug/mL MAY BE AN EFFECTIVE     CONCENTRATION FOR MANY PATIENTS.     HOWEVER, SOME ARE BEST TREATED     AT CONCENTRATIONS OUTSIDE THIS     RANGE.     ACETAMINOPHEN CONCENTRATIONS     >150 ug/mL AT 4 HOURS AFTER     INGESTION AND >50 ug/mL AT 12     HOURS AFTER INGESTION ARE     OFTEN ASSOCIATED WITH TOXIC     REACTIONS.  ETHANOL     Status: None   Collection Time    09/19/13 11:38 AM      Result Value Ref Range   Alcohol, Ethyl (B) <11  0 - 11 mg/dL   Comment:            LOWEST DETECTABLE LIMIT FOR     SERUM ALCOHOL IS 11 mg/dL     FOR MEDICAL PURPOSES ONLY  D-DIMER, QUANTITATIVE     Status: None   Collection Time    09/19/13 11:38 AM      Result Value Ref Range   D-Dimer, Quant 0.29  0.00 - 0.48 ug/mL-FEU   Comment:            AT THE INHOUSE ESTABLISHED CUTOFF     VALUE OF 0.48 ug/mL FEU,     THIS ASSAY HAS BEEN DOCUMENTED     IN THE LITERATURE TO HAVE     A SENSITIVITY AND NEGATIVE     PREDICTIVE VALUE OF AT LEAST     98 TO 99%.  THE TEST RESULT     SHOULD BE CORRELATED WITH     AN ASSESSMENT OF THE CLINICAL     PROBABILITY OF DVT / VTE.  TROPONIN I     Status: None  Collection Time    09/19/13 11:38 AM      Result Value Ref Range   Troponin I <0.30  <0.30 ng/mL   Comment:            Due to the release kinetics of cTnI,     a negative result within the first hours     of the onset of symptoms does not rule out     myocardial infarction with certainty.     If myocardial infarction is still suspected,     repeat the test at appropriate intervals.  PROTIME-INR     Status: None   Collection Time    09/19/13 11:39 AM      Result Value Ref Range   Prothrombin Time 12.7  11.6 - 15.2 seconds   INR 0.95  0.00 - 1.49  CBC WITH DIFFERENTIAL     Status: None   Collection Time    09/19/13 12:33 PM      Result Value Ref Range   WBC 7.6  4.0 - 10.5 K/uL   RBC 4.54   4.22 - 5.81 MIL/uL   Hemoglobin 13.9  13.0 - 17.0 g/dL   HCT 41.9  39.0 - 52.0 %   MCV 92.3  78.0 - 100.0 fL   MCH 30.6  26.0 - 34.0 pg   MCHC 33.2  30.0 - 36.0 g/dL   RDW 13.1  11.5 - 15.5 %   Platelets 222  150 - 400 K/uL   Neutrophils Relative % 48  43 - 77 %   Neutro Abs 3.7  1.7 - 7.7 K/uL   Lymphocytes Relative 41  12 - 46 %   Lymphs Abs 3.1  0.7 - 4.0 K/uL   Monocytes Relative 7  3 - 12 %   Monocytes Absolute 0.5  0.1 - 1.0 K/uL   Eosinophils Relative 3  0 - 5 %   Eosinophils Absolute 0.2  0.0 - 0.7 K/uL   Basophils Relative 1  0 - 1 %   Basophils Absolute 0.0  0.0 - 0.1 K/uL  GLUCOSE, CAPILLARY     Status: Abnormal   Collection Time    09/19/13  5:10 PM      Result Value Ref Range   Glucose-Capillary 121 (*) 70 - 99 mg/dL   Comment 1 Notify RN     Comment 2 Documented in Chart      Dg Chest 2 View  09/19/2013   CLINICAL DATA:  Hypertension, history of tobacco use  EXAM: CHEST  2 VIEW  COMPARISON:  01/08/2011  FINDINGS: The heart size and mediastinal contours are within normal limits. Both lungs are clear. The visualized skeletal structures show postsurgical change in the cervical spine  IMPRESSION: No active cardiopulmonary disease.   Electronically Signed   By: Inez Catalina M.D.   On: 09/19/2013 12:28   Ct Head Wo Contrast  09/19/2013   CLINICAL DATA:  Loss of consciousness, no known injury, history hypertension, diabetes, coronary artery disease with diastolic dysfunction  EXAM: CT HEAD WITHOUT CONTRAST  TECHNIQUE: Contiguous axial images were obtained from the base of the skull through the vertex without intravenous contrast.  COMPARISON:  None  FINDINGS: Normal ventricular morphology.  No midline shift or mass effect.  Normal appearance of brain parenchyma.  No intracranial hemorrhage, mass lesion, or acute infarction.  Visualized paranasal sinuses and mastoid air cells clear.  Bones unremarkable.  Atherosclerotic calcifications of internal carotid and vertebral arteries at  skullbase.  IMPRESSION: No acute  intracranial abnormalities.   Electronically Signed   By: Lavonia Dana M.D.   On: 09/19/2013 12:38        Jayliani Wanner A. Merlene Laughter, M.D.  Diplomate, Tax adviser of Psychiatry and Neurology ( Neurology). 09/19/2013, 7:31 PM

## 2013-09-19 NOTE — Progress Notes (Signed)
Late entry:  Patient's significant other reports that patient takes Hydrocodone 120 tablets every 10 days from [facility] in [place].  Reports that she is concerned he is taking too many pills.  Reports that he had 120 filled 09/14/13 and only has 16 tablets remaining.  Dr. Verlon Au on unit and notified.

## 2013-09-20 ENCOUNTER — Encounter: Payer: Self-pay | Admitting: Family Medicine

## 2013-09-20 DIAGNOSIS — G4710 Hypersomnia, unspecified: Secondary | ICD-10-CM | POA: Diagnosis present

## 2013-09-20 DIAGNOSIS — G4730 Sleep apnea, unspecified: Secondary | ICD-10-CM

## 2013-09-20 DIAGNOSIS — R55 Syncope and collapse: Secondary | ICD-10-CM | POA: Diagnosis not present

## 2013-09-20 LAB — RAPID URINE DRUG SCREEN, HOSP PERFORMED
Amphetamines: NOT DETECTED
BARBITURATES: NOT DETECTED
Benzodiazepines: POSITIVE — AB
Cocaine: NOT DETECTED
Opiates: POSITIVE — AB
TETRAHYDROCANNABINOL: NOT DETECTED

## 2013-09-20 LAB — COMPREHENSIVE METABOLIC PANEL
ALBUMIN: 3.6 g/dL (ref 3.5–5.2)
ALT: 17 U/L (ref 0–53)
ANION GAP: 10 (ref 5–15)
AST: 21 U/L (ref 0–37)
Alkaline Phosphatase: 90 U/L (ref 39–117)
BILIRUBIN TOTAL: 0.6 mg/dL (ref 0.3–1.2)
BUN: 10 mg/dL (ref 6–23)
CHLORIDE: 101 meq/L (ref 96–112)
CO2: 29 mEq/L (ref 19–32)
Calcium: 8.9 mg/dL (ref 8.4–10.5)
Creatinine, Ser: 0.62 mg/dL (ref 0.50–1.35)
GFR calc Af Amer: >90 mL/min (ref >90)
GFR calc non Af Amer: >90 mL/min (ref >90)
Glucose, Bld: 169 mg/dL — ABNORMAL HIGH (ref 70–99)
Potassium: 4.2 mEq/L (ref 3.7–5.3)
Sodium: 140 mEq/L (ref 137–147)
TOTAL PROTEIN: 7 g/dL (ref 6.0–8.3)

## 2013-09-20 LAB — CBC
HEMATOCRIT: 44.5 % (ref 39.0–52.0)
HEMOGLOBIN: 14.8 g/dL (ref 13.0–17.0)
MCH: 30.3 pg (ref 26.0–34.0)
MCHC: 33.3 g/dL (ref 30.0–36.0)
MCV: 91 fL (ref 78.0–100.0)
Platelets: 229 10*3/uL (ref 150–400)
RBC: 4.89 MIL/uL (ref 4.22–5.81)
RDW: 13 % (ref 11.5–15.5)
WBC: 7.1 10*3/uL (ref 4.0–10.5)

## 2013-09-20 LAB — PROTIME-INR
INR: 1.01 (ref 0.00–1.49)
Prothrombin Time: 13.3 seconds (ref 11.6–15.2)

## 2013-09-20 LAB — GLUCOSE, CAPILLARY
Glucose-Capillary: 185 mg/dL — ABNORMAL HIGH (ref 70–99)
Glucose-Capillary: 189 mg/dL — ABNORMAL HIGH (ref 70–99)

## 2013-09-20 LAB — VITAMIN B12: VITAMIN B 12: 889 pg/mL (ref 211–911)

## 2013-09-20 LAB — TSH: TSH: 0.421 u[IU]/mL (ref 0.350–4.500)

## 2013-09-20 LAB — HOMOCYSTEINE: HOMOCYSTEINE-NORM: 8.1 umol/L (ref 4.0–15.4)

## 2013-09-20 MED ORDER — GABAPENTIN 300 MG PO CAPS: 300.0000 mg | ORAL_CAPSULE | Freq: Every day | ORAL | Status: DC

## 2013-09-20 MED ORDER — MODAFINIL 200 MG PO TABS: 200.0000 mg | ORAL_TABLET | Freq: Every day | ORAL | Status: DC

## 2013-09-20 NOTE — Progress Notes (Signed)
Late entry:  AVS reviewed with patient.  Verbalized understanding of discharge orders, medications, and physician follow-up.  Patient verbalized that he wanted to walk home, which he stated was approximately 2-3 miles away.  Patient stated that he did not have his wallet.  Patient's girlfriend did not get off of work until 4:30 p.m.  Patient refused to wait for her to come and pick him up.  Dr. Verlon Au notified that patient wanted to walk home.  Stated to educate the patient on the dangers of walking home.  Patient educated on the dangers of walking home, risk of death, exposure to heat.  Patient refused offer for hospital to arrange a cab home.  Patient refused and ambulated to entrance for discharge.  Patient provided with note for work.  Patient stable at time of discharge.

## 2013-09-20 NOTE — Care Management Note (Signed)
    Page 1 of 1   09/20/2013     5:07:31 PM CARE MANAGEMENT NOTE 09/20/2013  Patient:  Jonathan Gilmore, Jonathan Gilmore   Account Number:  [id number]  Date Initiated:  09/20/2013  Documentation initiated by:  Vladimir Creeks  Subjective/Objective Assessment:   Admitted with ?syncope vs narcolepsy, but found be on large doses of narcotics and gabapentin. medications adjusted. Pt is from home and is independent. Lives with spouse, and will return home at D/C today. He needs a sleep study     Action/Plan:   Sleep study form filled out and given to MD, then returned to  Sleep Clinic.   Anticipated DC Date:  09/20/2013   Anticipated DC Plan:  [place]  CM consult      Choice offered to / List presented to:             Status of service:  Completed, signed off Medicare Important Message given?   (If response is "NO", the following Medicare IM given date fields will be blank) Date Medicare IM given:   Medicare IM given by:   Date Additional Medicare IM given:   Additional Medicare IM given by:    Discharge Disposition:  HOME/SELF CARE  Per UR Regulation:  Reviewed for med. necessity/level of care/duration of stay  If discussed at [place] of Stay Meetings, dates discussed:    Comments:  09/20/13 [place] RN/CM

## 2013-09-20 NOTE — Progress Notes (Signed)
Late entry 0730 - Patient's IV out upon assessment.  Site WNL.

## 2013-09-20 NOTE — Discharge Summary (Signed)
Patient has been seen and examined and mid-level provider. He has severe sleep apnea as per neurologist and is on multiple sedating agents, many of which have been either titrate hour discontinued. He had a CPAP machine which is working about 5 years ago but the mask has become more splayed and does not fit him properly and he would need to be refitted for this and home health will hopefully be able to get this done. He is completely aware alert and oriented today is not sleepy at all He has been cautioned that his medications will interact with his sleep apnea can cause him to have sinus symptoms in the future and I have cautioned him about lower doses of all of his medications on day of discharge.   Verneita Griffes, MD [facility] [phone]

## 2013-09-20 NOTE — Discharge Summary (Signed)
Physician Discharge Summary  Jonathan Gilmore IWL:[id number] DOB: 11-24-1950 DOA: 09/19/2013  PCP: Octavio Graves, DO  Admit date: 09/19/2013 Discharge date: 09/20/2013  Time spent: 40 minutes  Recommendations for Outpatient Follow-up:  1. PCP in 1-2 weeks for evaluation of symptoms, monitoring of opiates and follow TSH and Vitamin B12 level. Also recommend close follow up DM management for optimal control. 2. Sleep study clinic will contact to schedule sleep study 3. Follow gabapentin dose as decreased on discharge due to somnolence. May consider increasing as hypersomnia resolves  Discharge Diagnoses:  Principal Problem:   Hypersomnia with sleep apnea, unspecified Active Problems:   Cervical spondylosis with myelopathy   Murmur   Pericarditis   DM type 2 (diabetes mellitus, type 2)   Tobacco dependence   Diastolic CHF   Narcolepsy cataplexy syndrome   OSA (obstructive sleep apnea)   Narcolepsy and cataplexy   Discharge Condition: stable  Diet recommendation: heart healthy  Filed Weights   09/19/13 1542  Weight: 97.523 kg (215 lb)    History of present illness:   63 year old man with known h/o ACDF Viral pericarditis, severe 3 v LAD CAd on Ct chest 02/25/13-Lexiscan 03/26/13 showed small ai infarct LVEF 37% showed, DM ty 2, HyperTG, esophagitis, Admission for facial cellulitis 2/14-2/16/15 presents to ED on 09/19/13 having intermittent episodes of drops pulse. He sleeps well at night and doesn't really sleep much during the day. He sleeps at around 10 PM wakes up maybe once at 4 AM and sleeps a total of maybe 8-9 hours. Over the previous 2 months had episodes where he has spells of 30 seconds to 2 minutes of "nodding off" wherein he would be in the process of talking, or doing something and then he would jump of his words and fall asleep. This happened about 6 times the  Morning he presented and patient's girlfriend who was with him got concerned and called the primary care physician's  office. They recommended he come to the emergency room for workup. He had not really had any blurred or double vision,any fever any chills any evidences of infection, any one-sided weakness, any cough or any cold  His appetite had been fair but he had lost some weight recently intentionally.  He carries a diagnosis of sleep apnea test done at [facility] and does not use bipap at night  Lab work on admission unrevealing  CT head without contrast showed no acute intracranial abnormalities  Chest x-ray showed no acute cardiopulmonary issues  EKG sinus rhythm PR interval 0.12 QRS axis 45 degrees, R wave progression  Hospital Course:  Severe hypersomnia:  Evaluated by Dr Merlene Laughter with neurology who opined likely related to untreated obstructive sleep apnea syndrome. Opiates minimized. Modafinil started with some improvement. His girlfriend reported that he'd been taking more of the pain medications and he usually does and this may have an effect on his excessive somnolence. In addition, arrangements for sleep study will be initiated. The clinic will contact to schedule. Encouraged to wear CPAP at home. Follow up with PCP 1-2 weeks for evaluation of symptoms and follow TSH, Bitamin B12 and Homocysteine.  Active Problems:   Cervical spondylosis with myelopathy-see above discussion regarding pain medications. In addition I will withdraw his gabapentin for now and slowly reimplement it prior to discharge from hospital   Pericarditis- remained stable   Severe LAD stenosis on CT scan chest-continue medical management-continue aspirin 81 mg daily. He currently is not on any pressure medications   DM type  2 (diabetes mellitus, type 2)-well controlled per his report. Last A1c however shows persistent 10.6. He can continue his metformin 500 3 times a day. Recommend close OP follow up for optimal control.   Tobacco dependence-monitor and discussed with the patient cessation  Diastolic CHF- remained  stable   OSA (obstructive sleep apnea)-Continue home  BiPAP therapy. Sleep study to be arranged   Procedures:  none  Consultations:  Dr Merlene Laughter neurology  Discharge Exam: Filed Vitals:   09/20/13 0953  BP: 180/77  Pulse:   Temp:   Resp:     General: well nourished NAD Cardiovascular: RRR No MGR No LE edema Respiratory: normal effort BS clear bilaterally no wheeze  Discharge Instructions You were cared for by a hospitalist during your hospital stay. If you have any questions about your discharge medications or the care you received while you were in the hospital after you are discharged, you can call the unit and asked to speak with the hospitalist on call if the hospitalist that took care of you is not available. Once you are discharged, your primary care physician will handle any further medical issues. Please note that NO REFILLS for any discharge medications will be authorized once you are discharged, as it is imperative that you return to your primary care physician (or establish a relationship with a primary care physician if you do not have one) for your aftercare needs so that they can reassess your need for medications and monitor your lab values.     Medication List    STOP taking these medications       rifampin 300 MG capsule  Commonly known as:  RIFADIN      TAKE these medications       aspirin EC 81 MG tablet  Take 81 mg by mouth daily.     atorvastatin 20 MG tablet  Commonly known as:  LIPITOR  Take 1 tablet by mouth daily.     FLAXSEED OIL PO  Take 1 tablet by mouth daily.     gabapentin 300 MG capsule  Commonly known as:  NEURONTIN  Take 1 capsule (300 mg total) by mouth at bedtime.     HYDROcodone-acetaminophen 10-325 MG per tablet  Commonly known as:  NORCO  Take 1-2 tablets by mouth every 6 (six) hours as needed for severe pain.     metFORMIN 500 MG tablet  Commonly known as:  GLUCOPHAGE  Take 500 mg by mouth 3 (three) times daily with  meals.     modafinil 200 MG tablet  Commonly known as:  PROVIGIL  Take 1 tablet (200 mg total) by mouth daily.     multivitamin with minerals Tabs tablet  Take 1 tablet by mouth daily.     mupirocin ointment 2 %  Commonly known as:  BACTROBAN  Place 1 application into the nose 2 (two) times daily.     nitroGLYCERIN 0.4 MG SL tablet  Commonly known as:  NITROSTAT  Place 1 tablet (0.4 mg total) under the tongue every 5 (five) minutes as needed for chest pain.     sulfamethoxazole-trimethoprim 800-160 MG per tablet  Commonly known as:  BACTRIM DS  Take 2 tablets by mouth 2 (two) times daily. For 10 days started on 09/14/13       Allergies  Allergen Reactions  . Morphine And Related Nausea And Vomiting   Follow-up Information   Follow up with [place], [place], DO. Schedule an appointment as soon as possible for a visit in  1 week. (follow up symptoms)    Contact information:   [place] [address] [phone]       Follow up with sleep clinic will contact you to schedule study.       The results of significant diagnostics from this hospitalization (including imaging, microbiology, ancillary and laboratory) are listed below for reference.    Significant Diagnostic Studies: Dg Chest 2 View  09/19/2013   CLINICAL DATA:  Hypertension, history of tobacco use  EXAM: CHEST  2 VIEW  COMPARISON:  01/08/2011  FINDINGS: The heart size and mediastinal contours are within normal limits. Both lungs are clear. The visualized skeletal structures show postsurgical change in the cervical spine  IMPRESSION: No active cardiopulmonary disease.   Electronically Signed   By: Inez Catalina M.D.   On: 09/19/2013 12:28   Ct Head Wo Contrast  09/19/2013   CLINICAL DATA:  Loss of consciousness, no known injury, history hypertension, diabetes, coronary artery disease with diastolic dysfunction  EXAM: CT HEAD WITHOUT CONTRAST  TECHNIQUE: Contiguous axial images were obtained from the base of  the skull through the vertex without intravenous contrast.  COMPARISON:  None  FINDINGS: Normal ventricular morphology.  No midline shift or mass effect.  Normal appearance of brain parenchyma.  No intracranial hemorrhage, mass lesion, or acute infarction.  Visualized paranasal sinuses and mastoid air cells clear.  Bones unremarkable.  Atherosclerotic calcifications of internal carotid and vertebral arteries at skullbase.  IMPRESSION: No acute intracranial abnormalities.   Electronically Signed   By: Lavonia Dana M.D.   On: 09/19/2013 12:38    Microbiology: No results found for this or any previous visit (from the past 240 hour(s)).   Labs: Basic Metabolic Panel:  Recent Labs Lab 09/19/13 1138 09/20/13 0620  NA 139 140  K 4.6 4.2  CL 99 101  CO2 31 29  GLUCOSE 119* 169*  BUN 12 10  CREATININE 0.59 0.62  CALCIUM 9.0 8.9   Liver Function Tests:  Recent Labs Lab 09/19/13 1138 09/20/13 0620  AST 17 21  ALT 17 17  ALKPHOS 85 90  BILITOT 0.4 0.6  PROT 6.6 7.0  ALBUMIN 3.4* 3.6   No results found for this basename: LIPASE, AMYLASE,  in the last 168 hours No results found for this basename: AMMONIA,  in the last 168 hours CBC:  Recent Labs Lab 09/19/13 1233 09/20/13 0620  WBC 7.6 7.1  NEUTROABS 3.7  --   HGB 13.9 14.8  HCT 41.9 44.5  MCV 92.3 91.0  PLT 222 229   Cardiac Enzymes:  Recent Labs Lab 09/19/13 1138  TROPONINI <0.30   BNP: BNP (last 3 results) No results found for this basename: PROBNP,  in the last 8760 hours CBG:  Recent Labs Lab 09/19/13 1710 09/19/13 2300 09/20/13 0737 09/20/13 1134  GLUCAP 121* 315* 185* 189*       Signed:  Shammond Arave M  [facility] 09/20/2013, 1:56 PM

## 2013-09-20 NOTE — Progress Notes (Signed)
Secretary stated that [facility] called this morning and stated that they do not have records of a sleep study for this patient.

## 2013-09-20 NOTE — Progress Notes (Signed)
Inpatient Diabetes Program Recommendations  AACE/ADA: New Consensus Statement on Inpatient Glycemic Control (2013)  Target Ranges:  Prepandial:   less than 140 mg/dL      Peak postprandial:   less than 180 mg/dL (1-2 hours)      Critically ill patients:  140 - 180 mg/dL   Results for RYVER, POBLETE (MRN [id number]) as of 09/20/2013 08:19  Ref. Range 09/19/2013 17:10 09/19/2013 23:00 09/20/2013 07:37  Glucose-Capillary Latest Range: 70-99 mg/dL 121 (H) 315 (H) 185 (H)   Diabetes history: DM2 Outpatient Diabetes medications: Metformin 500 mg TID with meals Current orders for Inpatient glycemic control: Novolog 0-9 units AC, Novolog 4 units TID with meals for meal coverage, Metformin 500 mg TID  Inpatient Diabetes Program Recommendations Correction (SSI): Please consider adding Novolog bedtime correction scale.  Thanks, Barnie Alderman, RN, MSN, CCRN Diabetes Coordinator Inpatient Diabetes Program [phone] (Team Pager) [phone] (AP office) [phone] [facility] office)

## 2013-09-25 NOTE — Care Management Utilization Note (Signed)
UR completed 

## 2014-05-07 ENCOUNTER — Encounter (INDEPENDENT_AMBULATORY_CARE_PROVIDER_SITE_OTHER): Payer: Self-pay | Admitting: *Deleted

## 2015-01-24 ENCOUNTER — Encounter: Payer: Self-pay | Admitting: Cardiology

## 2015-01-29 ENCOUNTER — Encounter: Payer: Self-pay | Admitting: *Deleted

## 2015-02-11 ENCOUNTER — Ambulatory Visit: Payer: PRIVATE HEALTH INSURANCE | Admitting: Cardiology

## 2015-02-13 ENCOUNTER — Encounter (INDEPENDENT_AMBULATORY_CARE_PROVIDER_SITE_OTHER): Payer: Self-pay | Admitting: *Deleted

## 2015-02-20 ENCOUNTER — Ambulatory Visit (INDEPENDENT_AMBULATORY_CARE_PROVIDER_SITE_OTHER): Payer: PRIVATE HEALTH INSURANCE | Admitting: Cardiology

## 2015-02-20 ENCOUNTER — Encounter: Payer: Self-pay | Admitting: Cardiology

## 2015-02-20 VITALS — BP 157/81 | HR 77 | Ht 66.0 in | Wt 223.2 lb

## 2015-02-20 DIAGNOSIS — Z136 Encounter for screening for cardiovascular disorders: Secondary | ICD-10-CM

## 2015-02-20 DIAGNOSIS — R0789 Other chest pain: Secondary | ICD-10-CM

## 2015-02-20 NOTE — Patient Instructions (Signed)
Your physician wants you to follow-up in: [place] DR. BRANCH You will receive a reminder letter in the mail two months in advance. If you don't receive a letter, please call our office to schedule the follow-up appointment.  Your physician recommends that you continue on your current medications as directed. Please refer to the Current Medication list given to you today.  Your physician has requested that you regularly monitor and record your blood pressure readings at home FOR 1 [place]. Please use the same machine at the same time of day to check your readings and record them to bring to your follow-up visit.  Thank you for choosing [place]!!

## 2015-02-20 NOTE — Progress Notes (Signed)
Patient ID: WITTEN STENERSON, male   DOB: [birth year], 64 y.o.   MRN: [id number]     Clinical Summary Mr. Nagle is a 64 y.o.male last seen by NP Purcell Nails, this is our first visit together. He is seen for the following medical problems.  1. Chest pain - admit to [facility] in [place], [place] early Nov 2016 - CT PE negative for PE, did have some extensive coronary calcficiation and cardiomegaly. Thickened esophagus.Troponins negative, EKG nonspecific ST/T changes per report (actual ekg not included in records) - CT scan chest 02/2013 with 3 vessel CAD, severe LAD disease.  - while sitting developed  severe pain midchest, sharp 8/10. +SOB, no palpitations. Had some nausea and dry heaves, big belch and relieved symptoms. Worst with position, worst with deep breathing. Pain lasted 6-8 hours of pain - similar pain in the past last summer. Can have some dysphagia. Has upcoming with GI   2. Severe OSA - not using CPAP machine  3. Hyperlipidemia - reports upcoming panel with pcp  4. Tobacco use - ongoing for 50 years - tried e cigs without much benefit.  Past Medical History  Diagnosis Date  . Hypertension   . Sleep apnea     tested at sleep apnea at [facility]  . Diabetes mellitus     since 1993  . Back pain     herniated disk L4-L5  . MRSA (methicillin resistant staph aureus) culture positive   . Abnormal cardiovascular stress test 03/2013  . Diastolic dysfunction   . Coronary artery disease      Allergies  Allergen Reactions  . Morphine And Related Nausea And Vomiting     Current Outpatient Prescriptions  Medication Sig Dispense Refill  . aspirin EC 81 MG tablet Take 81 mg by mouth daily.     Marland Kitchen atorvastatin (LIPITOR) 20 MG tablet Take 1 tablet by mouth daily.    . Flaxseed, Linseed, (FLAXSEED OIL PO) Take 1 tablet by mouth daily.     Marland Kitchen gabapentin (NEURONTIN) 300 MG capsule Take 1 capsule (300 mg total) by mouth at bedtime. 30 capsule 0  .  HYDROcodone-acetaminophen (NORCO) 10-325 MG per tablet Take 1-2 tablets by mouth every 6 (six) hours as needed for severe pain.     . metFORMIN (GLUCOPHAGE) 500 MG tablet Take 500 mg by mouth 3 (three) times daily with meals.     . modafinil (PROVIGIL) 200 MG tablet Take 1 tablet (200 mg total) by mouth daily. 30 tablet 0  . Multiple Vitamin (MULTIVITAMIN WITH MINERALS) TABS Take 1 tablet by mouth daily.    . mupirocin ointment (BACTROBAN) 2 % Place 1 application into the nose 2 (two) times daily.    . nitroGLYCERIN (NITROSTAT) 0.4 MG SL tablet Place 1 tablet (0.4 mg total) under the tongue every 5 (five) minutes as needed for chest pain. 25 tablet 4  . sulfamethoxazole-trimethoprim (BACTRIM DS) 800-160 MG per tablet Take 2 tablets by mouth 2 (two) times daily. For 10 days started on 09/14/13     No current facility-administered medications for this visit.     Past Surgical History  Procedure Laterality Date  . Bone spur removed      back in 1997, left shoulder  . Hemorrhoid surgery    . Anterior cervical decomp/discectomy fusion  01/18/2011    Procedure: ANTERIOR CERVICAL DECOMPRESSION/DISCECTOMY FUSION 2 LEVELS;  Surgeon: Cooper Render Pool;  Location: [place] [facility];  Service: Neurosurgery;  Laterality: N/A;  anterior cervical discectomy and fusion  with allograft and plating, cervical five-six, cervical six-seven  . Carpal tunnel release      right     Allergies  Allergen Reactions  . Morphine And Related Nausea And Vomiting      Family History  Problem Relation Age of Onset  . Cancer Mother   . Diabetes Father   . Diabetes Sister   . Cushing syndrome Sister      Social History Mr. Ruland reports that he has been smoking Cigarettes.  He has a 50 pack-year smoking history. He has quit using smokeless tobacco. Mr. Kinzie reports that he does not drink alcohol.   Review of Systems CONSTITUTIONAL: No weight loss, fever, chills, weakness or fatigue.  HEENT: Eyes: No visual loss, blurred  vision, double vision or yellow sclerae.No hearing loss, sneezing, congestion, runny nose or sore throat.  SKIN: No rash or itching.  CARDIOVASCULAR: per hpi RESPIRATORY: No shortness of breath, cough or sputum.  GASTROINTESTINAL: No anorexia, nausea, vomiting or diarrhea. No abdominal pain or blood.  GENITOURINARY: No burning on urination, no polyuria NEUROLOGICAL: No headache, dizziness, syncope, paralysis, ataxia, numbness or tingling in the extremities. No change in bowel or bladder control.  MUSCULOSKELETAL: No muscle, back pain, joint pain or stiffness.  LYMPHATICS: No enlarged nodes. No history of splenectomy.  PSYCHIATRIC: No history of depression or anxiety.  ENDOCRINOLOGIC: No reports of sweating, cold or heat intolerance. No polyuria or polydipsia.  Marland Kitchen   Physical Examination Filed Vitals:   02/20/15 1522  BP: 157/81  Pulse: 77   Filed Vitals:   02/20/15 1522  Height: 5\' 6"  (1.676 m)  Weight: 223 lb 3.2 oz (101.243 kg)    Gen: resting comfortably, no acute distress HEENT: no scleral icterus, pupils equal round and reactive, no palptable cervical adenopathy,  CV: RRR, no m/r/g, no jvd Resp: Clear to auscultation bilaterally GI: abdomen is soft, non-tender, non-distended, normal bowel sounds, no hepatosplenomegaly MSK: extremities are warm, no edema.  Skin: warm, no rash Neuro:  no focal deficits Psych: appropriate affect   Diagnostic Studies  Jan 2015 MPI IMPRESSION: 1. Abnormal exercise myocardial perfusion imaging stress test  2. Inferior wall defect likely due to subdiaphragmatic attenuation, its wall motion is similar to other areas without a perfusion defect.  3. Likely small anteroseptal infract, this area is more hypokinetic than the rest of the myocardium  3. Low left ventricular systolic function with global hypokinesis  4. Mildly reduced functional capacity (90% of age and gender predicted)  5. Overall increased risk for major cardiac  events based on low ejection fraction. There is no current myocardium at jeopardy.   02/2014 echo Study Conclusions  - Left ventricle: The cavity size was normal. There was mild concentric hypertrophy. Systolic function was normal. The estimated ejection fraction was in the range of 55% to 60%. Wall motion was normal; there were no regional wall motion abnormalities. There is mildly asynchronous contraction consistent with intraventricular conduction delay (IVCD) or bundle Delorice Bannister block. Doppler parameters are consistent with abnormal left ventricular relaxation (grade 1 diastolic dysfunction). - Aortic valve: Cusp separation was mildly reduced. There was mild to moderate stenosis. - Left atrium: The atrium was moderately dilated.  Assessment and Plan  1. Chest pain - symptoms consistent with GI etiology, he has appointment coming up with GI later this month - no further cardiac testing at this time  2. OSA - encouraged compliance with CPAP  3. Tobacco abuse - advised to quit,, he is not ready  4. Hyperlipidmia -  upcoming labs with pcp - continue statin  5. Elevated bp - he will keep bp log x 1 week and submit F//u 6 months      Arnoldo Lenis, M.D.

## 2015-03-05 ENCOUNTER — Encounter: Payer: Self-pay | Admitting: *Deleted

## 2015-03-20 ENCOUNTER — Encounter (INDEPENDENT_AMBULATORY_CARE_PROVIDER_SITE_OTHER): Payer: Self-pay | Admitting: Internal Medicine

## 2015-03-20 ENCOUNTER — Ambulatory Visit (INDEPENDENT_AMBULATORY_CARE_PROVIDER_SITE_OTHER): Payer: PRIVATE HEALTH INSURANCE | Admitting: Internal Medicine

## 2015-03-20 ENCOUNTER — Encounter (INDEPENDENT_AMBULATORY_CARE_PROVIDER_SITE_OTHER): Payer: Self-pay | Admitting: *Deleted

## 2015-03-20 VITALS — BP 154/64 | HR 76 | Temp 98.2°F | Ht 66.0 in | Wt 223.6 lb

## 2015-03-20 DIAGNOSIS — R1310 Dysphagia, unspecified: Secondary | ICD-10-CM

## 2015-03-20 MED ORDER — OMEPRAZOLE 40 MG PO CPDR
40.0000 mg | DELAYED_RELEASE_CAPSULE | Freq: Every day | ORAL | Status: DC
Start: 2015-03-20 — End: 2015-08-09

## 2015-03-20 NOTE — Progress Notes (Signed)
Subjective:    Patient ID: Jonathan Gilmore, male    DOB: 06-22-1950, 65 y.o.   MRN: [id number]  HPI Referred by Dr Melina Copa for GERD. Per records from Dr. Melina Copa, see in ED in [place] in November with chest pain. He underwent a CT angiography of the chest: pulmonary protocol. No evidence of pulmonary embolism. Cardiomegaly with trace pericardial effusion and possible mild interstitial pulmonary edema.   Extensive coronary calcifications. Thick walled somewhat patulous esophagus containing air. This could be related to achalasia. His troponin was normal. Patient apparently left AMA. He has been evaluated by Dr. Carlyle Dolly since then.   . Patient states when he eats or drinks something he cannot swallow. He says it feels like it lodges in his mid-esophagus.  He will burp and the bolus pass. This had occurred about 3 times in the past 3 months.  He tells me when he burps his symptoms will resolved. He has acid reflux and takes Rolaids occasionally. Appetite is good. No weight loss.  He usually has a BM daily.    Review of Systems Past Medical History  Diagnosis Date  . Hypertension   . Sleep apnea     tested at sleep apnea at [facility]  . Diabetes mellitus     since 1993  . Back pain     herniated disk L4-L5  . MRSA (methicillin resistant staph aureus) culture positive   . Abnormal cardiovascular stress test 03/2013  . Diastolic dysfunction   . Coronary artery disease     Past Surgical History  Procedure Laterality Date  . Bone spur removed      back in 1997, left shoulder  . Hemorrhoid surgery    . Anterior cervical decomp/discectomy fusion  01/18/2011    Procedure: ANTERIOR CERVICAL DECOMPRESSION/DISCECTOMY FUSION 2 LEVELS;  Surgeon: Cooper Render Pool;  Location: [place] [facility];  Service: Neurosurgery;  Laterality: N/A;  anterior cervical discectomy and fusion with allograft and plating, cervical five-six, cervical six-seven  . Carpal tunnel release      right     Allergies  Allergen Reactions  . Morphine And Related Nausea And Vomiting    Current Outpatient Prescriptions on File Prior to Visit  Medication Sig Dispense Refill  . aspirin EC 81 MG tablet Take 81 mg by mouth daily.     . Flaxseed, Linseed, (FLAXSEED OIL PO) Take 1 tablet by mouth daily.     Marland Kitchen gabapentin (NEURONTIN) 300 MG capsule Take 1 capsule (300 mg total) by mouth at bedtime. 30 capsule 0  . HYDROcodone-acetaminophen (NORCO) 10-325 MG per tablet Take 1-2 tablets by mouth every 6 (six) hours as needed for severe pain.     . metFORMIN (GLUCOPHAGE) 500 MG tablet Take 500 mg by mouth 2 (two) times daily.     . modafinil (PROVIGIL) 200 MG tablet Take 1 tablet (200 mg total) by mouth daily. 30 tablet 0  . Multiple Vitamin (MULTIVITAMIN WITH MINERALS) TABS Take 1 tablet by mouth daily.    . nitroGLYCERIN (NITROSTAT) 0.4 MG SL tablet Place 1 tablet (0.4 mg total) under the tongue every 5 (five) minutes as needed for chest pain. 25 tablet 4   No current facility-administered medications on file prior to visit.        Objective:   Physical Exam Blood pressure 154/64, pulse 76, temperature 98.2 F (36.8 C), height 5\' 6"  (1.676 m), weight 223 lb 9.6 oz (101.424 kg). Alert and oriented. Skin warm and dry. Oral mucosa is moist.   .  Sclera anicteric, conjunctivae is pink. Thyroid not enlarged. No cervical lymphadenopathy. Lungs clear. Heart regular rate and rhythm.  Abdomen is soft. Bowel sounds are positive. No hepatomegaly. No abdominal masses felt. No tenderness.  No edema to lower extremities.          Assessment & Plan:  Solid food dysphagia.  Am going to get an esophagram. Further recommendations to follow. Patient will probably need an EGD/ED in the near future.  Rx for Omeprazole 40mg  daily.

## 2015-03-20 NOTE — Patient Instructions (Signed)
DG Esophagram. Further recommendations to follow.  

## 2015-03-26 ENCOUNTER — Ambulatory Visit (HOSPITAL_COMMUNITY)
Admission: RE | Admit: 2015-03-26 | Discharge: 2015-03-26 | Disposition: A | Discharge status: Home / Self Care | Payer: PRIVATE HEALTH INSURANCE | Source: Ambulatory Visit | Attending: Internal Medicine | Admitting: Internal Medicine

## 2015-03-26 DIAGNOSIS — K224 Dyskinesia of esophagus: Secondary | ICD-10-CM | POA: Diagnosis not present

## 2015-03-26 DIAGNOSIS — R933 Abnormal findings on diagnostic imaging of other parts of digestive tract: Secondary | ICD-10-CM | POA: Insufficient documentation

## 2015-03-26 DIAGNOSIS — R1310 Dysphagia, unspecified: Secondary | ICD-10-CM | POA: Insufficient documentation

## 2015-03-31 ENCOUNTER — Other Ambulatory Visit (INDEPENDENT_AMBULATORY_CARE_PROVIDER_SITE_OTHER): Payer: Self-pay | Admitting: Internal Medicine

## 2015-03-31 ENCOUNTER — Telehealth (INDEPENDENT_AMBULATORY_CARE_PROVIDER_SITE_OTHER): Payer: Self-pay | Admitting: Internal Medicine

## 2015-03-31 DIAGNOSIS — R1319 Other dysphagia: Secondary | ICD-10-CM

## 2015-03-31 DIAGNOSIS — R933 Abnormal findings on diagnostic imaging of other parts of digestive tract: Secondary | ICD-10-CM

## 2015-03-31 NOTE — Telephone Encounter (Signed)
Results given to patient. Will schedule an EGD/ED and possible biopsy. I discussed with Dr. Laural Golden earlier.Patient is agreeable.  He has an abnormal DG. Esophagram.

## 2015-04-04 ENCOUNTER — Ambulatory Visit (HOSPITAL_COMMUNITY)
Admission: RE | Admit: 2015-04-04 | Discharge: 2015-04-04 | Disposition: A | Discharge status: Home / Self Care | Payer: PRIVATE HEALTH INSURANCE | Source: Ambulatory Visit | Attending: Internal Medicine | Admitting: Internal Medicine

## 2015-04-04 ENCOUNTER — Encounter (HOSPITAL_COMMUNITY)
Admission: RE | Disposition: A | Discharge status: Home / Self Care | Payer: Self-pay | Source: Ambulatory Visit | Attending: Internal Medicine

## 2015-04-04 ENCOUNTER — Encounter (HOSPITAL_COMMUNITY): Payer: Self-pay | Admitting: *Deleted

## 2015-04-04 DIAGNOSIS — Z7982 Long term (current) use of aspirin: Secondary | ICD-10-CM | POA: Diagnosis not present

## 2015-04-04 DIAGNOSIS — Z7984 Long term (current) use of oral hypoglycemic drugs: Secondary | ICD-10-CM | POA: Insufficient documentation

## 2015-04-04 DIAGNOSIS — E119 Type 2 diabetes mellitus without complications: Secondary | ICD-10-CM | POA: Diagnosis not present

## 2015-04-04 DIAGNOSIS — Z79899 Other long term (current) drug therapy: Secondary | ICD-10-CM | POA: Insufficient documentation

## 2015-04-04 DIAGNOSIS — I5030 Unspecified diastolic (congestive) heart failure: Secondary | ICD-10-CM | POA: Diagnosis not present

## 2015-04-04 DIAGNOSIS — R1310 Dysphagia, unspecified: Secondary | ICD-10-CM | POA: Diagnosis present

## 2015-04-04 DIAGNOSIS — R933 Abnormal findings on diagnostic imaging of other parts of digestive tract: Secondary | ICD-10-CM | POA: Diagnosis not present

## 2015-04-04 DIAGNOSIS — F17210 Nicotine dependence, cigarettes, uncomplicated: Secondary | ICD-10-CM | POA: Diagnosis not present

## 2015-04-04 DIAGNOSIS — I110 Hypertensive heart disease with heart failure: Secondary | ICD-10-CM | POA: Insufficient documentation

## 2015-04-04 DIAGNOSIS — K2960 Other gastritis without bleeding: Secondary | ICD-10-CM | POA: Insufficient documentation

## 2015-04-04 DIAGNOSIS — R1319 Other dysphagia: Secondary | ICD-10-CM

## 2015-04-04 DIAGNOSIS — G4730 Sleep apnea, unspecified: Secondary | ICD-10-CM | POA: Diagnosis not present

## 2015-04-04 DIAGNOSIS — K2970 Gastritis, unspecified, without bleeding: Secondary | ICD-10-CM | POA: Diagnosis not present

## 2015-04-04 DIAGNOSIS — K3189 Other diseases of stomach and duodenum: Secondary | ICD-10-CM | POA: Diagnosis not present

## 2015-04-04 HISTORY — PX: BIOPSY: SHX5522

## 2015-04-04 HISTORY — PX: ESOPHAGEAL DILATION: SHX303

## 2015-04-04 HISTORY — PX: ESOPHAGOGASTRODUODENOSCOPY: SHX5428

## 2015-04-04 LAB — GLUCOSE, CAPILLARY: Glucose-Capillary: 160 mg/dL — ABNORMAL HIGH (ref 65–99)

## 2015-04-04 SURGERY — EGD (ESOPHAGOGASTRODUODENOSCOPY)
Anesthesia: Moderate Sedation

## 2015-04-04 MED ORDER — MEPERIDINE HCL 50 MG/ML IJ SOLN
INTRAMUSCULAR | Status: AC
  Filled 2015-04-04: qty 1

## 2015-04-04 MED ORDER — MIDAZOLAM HCL 5 MG/5ML IJ SOLN
INTRAMUSCULAR | Status: DC | PRN
  Administered 2015-04-04: 3 mg via INTRAVENOUS
  Administered 2015-04-04 (×2): 2 mg via INTRAVENOUS

## 2015-04-04 MED ORDER — MEPERIDINE HCL 50 MG/ML IJ SOLN
INTRAMUSCULAR | Status: DC | PRN
  Administered 2015-04-04 (×2): 25 mg via INTRAVENOUS
  Administered 2015-04-04: 25 mg

## 2015-04-04 MED ORDER — STERILE WATER FOR IRRIGATION IR SOLN
Status: DC | PRN
  Administered 2015-04-04: 2.5 mL

## 2015-04-04 MED ORDER — SODIUM CHLORIDE 0.9 % IV SOLN
INTRAVENOUS | Status: DC
  Administered 2015-04-04: 13:00:00 via INTRAVENOUS

## 2015-04-04 MED ORDER — MIDAZOLAM HCL 5 MG/5ML IJ SOLN
INTRAMUSCULAR | Status: AC
  Filled 2015-04-04: qty 10

## 2015-04-04 MED ORDER — BUTAMBEN-TETRACAINE-BENZOCAINE 2-2-14 % EX AERO
INHALATION_SPRAY | CUTANEOUS | Status: DC | PRN
  Administered 2015-04-04: 2 via TOPICAL

## 2015-04-04 NOTE — H&P (Signed)
Jonathan Gilmore is an 65 y.o. male.   Chief Complaint: Patient is here for EGD. HPI: Patient is 65 year old Caucasian male with multiple medical problems whose been experiencing intermittent dysphagia or married to solids but occasionally to liquids for the last 4-5 months. Back in November 2016 while he was visiting family in [place] he ended up in emergency room with chest pain and chest CT angina revealed thickening to esophageal wall with air. Patient had barium study last week revealing a mucosal irregularity and mid esophagus. He rarely has heartburn. He denies nausea vomiting anorexia weight loss or melena.  Past Medical History  Diagnosis Date  . Hypertension   . Sleep apnea     tested at sleep apnea at [facility]  . Diabetes mellitus     since 1993  . Back pain     herniated disk L4-L5  . MRSA (methicillin resistant staph aureus) culture positive   . Abnormal cardiovascular stress test 03/2013  . Diastolic dysfunction   . Coronary artery disease     Past Surgical History  Procedure Laterality Date  . Bone spur removed      back in 1997, left shoulder  . Hemorrhoid surgery    . Anterior cervical decomp/discectomy fusion  01/18/2011    Procedure: ANTERIOR CERVICAL DECOMPRESSION/DISCECTOMY FUSION 2 LEVELS;  Surgeon: Cooper Render Pool;  Location: [place] [facility];  Service: Neurosurgery;  Laterality: N/A;  anterior cervical discectomy and fusion with allograft and plating, cervical five-six, cervical six-seven  . Carpal tunnel release      right    Family History  Problem Relation Age of Onset  . Cancer Mother   . Diabetes Father   . Diabetes Sister   . Cushing syndrome Sister    Social History:  reports that he has been smoking Cigarettes.  He has a 50 pack-year smoking history. He has quit using smokeless tobacco. He reports that he does not drink alcohol or use illicit drugs.  Allergies:  Allergies  Allergen Reactions  . Morphine And Related Nausea And Vomiting     Medications Prior to Admission  Medication Sig Dispense Refill  . aspirin EC 81 MG tablet Take 81 mg by mouth daily.     . Flaxseed, Linseed, (FLAXSEED OIL PO) Take 1 tablet by mouth daily.     Marland Kitchen gabapentin (NEURONTIN) 300 MG capsule Take 1 capsule (300 mg total) by mouth at bedtime. 30 capsule 0  . HYDROcodone-acetaminophen (NORCO) 10-325 MG per tablet Take 1-2 tablets by mouth every 6 (six) hours as needed for severe pain.     . metFORMIN (GLUCOPHAGE) 500 MG tablet Take 500 mg by mouth 2 (two) times daily.     . modafinil (PROVIGIL) 200 MG tablet Take 1 tablet (200 mg total) by mouth daily. 30 tablet 0  . Multiple Vitamin (MULTIVITAMIN WITH MINERALS) TABS Take 1 tablet by mouth daily.    Marland Kitchen omeprazole (PRILOSEC) 40 MG capsule Take 1 capsule (40 mg total) by mouth daily. 30 capsule 3  . nitroGLYCERIN (NITROSTAT) 0.4 MG SL tablet Place 1 tablet (0.4 mg total) under the tongue every 5 (five) minutes as needed for chest pain. 25 tablet 4    Results for orders placed or performed during the hospital encounter of 04/04/15 (from the past 48 hour(s))  Glucose, capillary     Status: Abnormal   Collection Time: 04/04/15 12:35 PM  Result Value Ref Range   Glucose-Capillary 160 (H) 65 - 99 mg/dL   No results found.  ROS  Blood pressure 158/89, pulse 87, temperature 98.7 F (37.1 C), temperature source Oral, resp. rate 12, height 5\' 6"  (1.676 m), weight 223 lb (101.152 kg), SpO2 96 %. Physical Exam  Constitutional: He appears well-developed and well-nourished.  HENT:  Mouth/Throat: Oropharynx is clear and moist.  Eyes: Conjunctivae are normal. No scleral icterus.  Neck: No thyromegaly present.  Cardiovascular: Normal rate, regular rhythm and normal heart sounds.   No murmur heard. Respiratory: Effort normal and breath sounds normal.  GI: Soft. He exhibits no distension and no mass. There is no tenderness.  Musculoskeletal: He exhibits no edema.  Lymphadenopathy:    He has no cervical  adenopathy.  Neurological: He is alert.  Skin: Skin is warm and dry.     Assessment/Plan Esophageal dysphagia. Abnormal esophagogram. EGD possible ED.  Deena Shaub U 04/04/2015, 1:11 PM

## 2015-04-04 NOTE — Discharge Instructions (Signed)
Resume aspirin on 04/05/2015. Resume other medications as before. Resume usual diet. No driving for 24 hours. Physician will call with biopsy results.  Gastrointestinal Endoscopy, Care After Refer to this sheet in the next few weeks. These instructions provide you with information on caring for yourself after your procedure. Your caregiver may also give you more specific instructions. Your treatment has been planned according to current medical practices, but problems sometimes occur. Call your caregiver if you have any problems or questions after your procedure. HOME CARE INSTRUCTIONS  If you were given medicine to help you relax (sedative), do not drive, operate machinery, or sign important documents for 24 hours.  Avoid alcohol and hot or warm beverages for the first 24 hours after the procedure.  Only take over-the-counter or prescription medicines for pain, discomfort, or fever as directed by your caregiver. You may resume taking your normal medicines unless your caregiver tells you otherwise. Ask your caregiver when you may resume taking medicines that may cause bleeding, such as aspirin, clopidogrel, or warfarin.  You may return to your normal diet and activities on the day after your procedure, or as directed by your caregiver. Walking may help to reduce any bloated feeling in your abdomen.  Drink enough fluids to keep your urine clear or pale yellow.  You may gargle with salt water if you have a sore throat. SEEK IMMEDIATE MEDICAL CARE IF:  You have severe nausea or vomiting.  You have severe abdominal pain, abdominal cramps that last longer than 6 hours, or abdominal swelling (distention).  You have severe shoulder or back pain.  You have trouble swallowing.  You have shortness of breath, your breathing is shallow, or you are breathing faster than normal.  You have a fever or a rapid heartbeat.  You vomit blood or material that looks like coffee grounds.  You have  bloody, black, or tarry stools. MAKE SURE YOU:  Understand these instructions.  Will watch your condition.  Will get help right away if you are not doing well or get worse.   This information is not intended to replace advice given to you by your health care provider. Make sure you discuss any questions you have with your health care provider.   Document Released: 10/14/2003 Document Revised: 03/22/2014 Document Reviewed: 06/01/2011 Elsevier Interactive Patient Education [employer].

## 2015-04-04 NOTE — Op Note (Addendum)
EGD PROCEDURE REPORT  PATIENT:  Jonathan Gilmore  MR#:  [id number] Birthdate:  1950-07-08, 65 y.o., male Endoscopist:  Dr. Rogene Houston, MD Referred By:  Dr. Octavio Graves, DO Procedure Date: 04/04/2015  Procedure:   EGD with ED  Indications:  Patient is 65 year old Caucasian male with multiple medical problems who presents with few months history of dysphagia. He had chest CT while in [place] for 2 months ago which revealed thickening to esophageal wall and barium study recently revealed mucosal irregularity to mid esophagus.            Informed Consent:  The risks, benefits, alternatives & imponderables which include, but are not limited to, bleeding, infection, perforation, drug reaction and potential missed lesion have been reviewed.  The potential for biopsy, lesion removal, esophageal dilation, etc. have also been discussed.  Questions have been answered.  All parties agreeable.  Please see history & physical in medical record for more information.  Medications:  Demerol 75 mg IV Versed 10 mg IV Cetacaine spray topically for oropharyngeal anesthesia  First dose administered at 1317Last dose administered at  1336 Scope Out 13:41   Description of procedure:  The endoscope was introduced through the mouth and advanced to the second portion of the duodenum without difficulty or limitations. The mucosal surfaces were surveyed very carefully during advancement of the scope and upon withdrawal.  Findings:  Esophagus:  Fine nodularity noted to mucosa and midesophagus with pale appearance to mucosa. No erosions ulcer or stricture noted. GE junction unremarkable. GEJ:  45 cm Stomach:  Stomach was empty and distended very well with insufflation. Folds in the proximal stomach were normal. Examination of mucosa at gastric body was normal. Antral mucosa revealed patchy erythema with erosions and multiple petechial hemorrhages. Pyloric channel was patent. Tenderness fundus and cardia were  examined by retroflex the scope and were normal. Duodenum:  Bulbar and post bulbar mucosa.  Therapeutic/Diagnostic Maneuvers Performed:  Esophagus was dilated by passing 56 [place] Maloney dilator to full insertion.  Endoscope was passed again and no mucosal disruption noted. Multiple biopsies taken from nodular esophageal mucosa.   Complications:  None  EBL: minimal  Impression: Fine nodularity to mucosa at mid-esophagus. No evidence of erosive esophagitis ring or stricture. Erosive antral gastritis. Esophagus dilated by passing 56 [place] Maloney dilator but no mucosal disruption noted. Multiple biopsies taken from esophageal mucosal nodules.   Recommendations:  Standard instructions given. H. pylori serology. I will be contacting patient with biopsy results.  Brigette Hopfer U  04/04/2015  1:54 PM  CC: Dr. Octavio Graves, DO & Dr. No ref. provider found

## 2015-04-05 LAB — H. PYLORI ANTIBODY, IGG: H Pylori IgG: <0.9 U/mL (ref 0.0–0.8)

## 2015-04-14 ENCOUNTER — Encounter (HOSPITAL_COMMUNITY): Payer: Self-pay | Admitting: Internal Medicine

## 2015-08-07 ENCOUNTER — Inpatient Hospital Stay (HOSPITAL_COMMUNITY)
Admission: EM | Admit: 2015-08-07 | Discharge: 2015-08-09 | DRG: 287 | Disposition: A | Discharge status: Home / Self Care | Payer: PRIVATE HEALTH INSURANCE | Attending: Internal Medicine | Admitting: Internal Medicine

## 2015-08-07 ENCOUNTER — Encounter (HOSPITAL_COMMUNITY): Payer: Self-pay

## 2015-08-07 ENCOUNTER — Inpatient Hospital Stay (HOSPITAL_COMMUNITY): Payer: PRIVATE HEALTH INSURANCE

## 2015-08-07 ENCOUNTER — Emergency Department (HOSPITAL_COMMUNITY): Payer: PRIVATE HEALTH INSURANCE

## 2015-08-07 DIAGNOSIS — G4733 Obstructive sleep apnea (adult) (pediatric): Secondary | ICD-10-CM | POA: Diagnosis present

## 2015-08-07 DIAGNOSIS — I25110 Atherosclerotic heart disease of native coronary artery with unstable angina pectoris: Secondary | ICD-10-CM | POA: Diagnosis present

## 2015-08-07 DIAGNOSIS — I350 Nonrheumatic aortic (valve) stenosis: Secondary | ICD-10-CM | POA: Diagnosis present

## 2015-08-07 DIAGNOSIS — I200 Unstable angina: Secondary | ICD-10-CM | POA: Diagnosis not present

## 2015-08-07 DIAGNOSIS — E1169 Type 2 diabetes mellitus with other specified complication: Secondary | ICD-10-CM

## 2015-08-07 DIAGNOSIS — E1159 Type 2 diabetes mellitus with other circulatory complications: Secondary | ICD-10-CM

## 2015-08-07 DIAGNOSIS — Z7982 Long term (current) use of aspirin: Secondary | ICD-10-CM

## 2015-08-07 DIAGNOSIS — I10 Essential (primary) hypertension: Secondary | ICD-10-CM | POA: Diagnosis not present

## 2015-08-07 DIAGNOSIS — I509 Heart failure, unspecified: Secondary | ICD-10-CM | POA: Diagnosis not present

## 2015-08-07 DIAGNOSIS — E785 Hyperlipidemia, unspecified: Secondary | ICD-10-CM | POA: Diagnosis present

## 2015-08-07 DIAGNOSIS — I2510 Atherosclerotic heart disease of native coronary artery without angina pectoris: Secondary | ICD-10-CM | POA: Diagnosis not present

## 2015-08-07 DIAGNOSIS — R0602 Shortness of breath: Secondary | ICD-10-CM | POA: Diagnosis present

## 2015-08-07 DIAGNOSIS — Z7984 Long term (current) use of oral hypoglycemic drugs: Secondary | ICD-10-CM

## 2015-08-07 DIAGNOSIS — J810 Acute pulmonary edema: Secondary | ICD-10-CM | POA: Insufficient documentation

## 2015-08-07 DIAGNOSIS — I5030 Unspecified diastolic (congestive) heart failure: Secondary | ICD-10-CM | POA: Diagnosis present

## 2015-08-07 DIAGNOSIS — E1100 Type 2 diabetes mellitus with hyperosmolarity without nonketotic hyperglycemic-hyperosmolar coma (NKHHC): Secondary | ICD-10-CM

## 2015-08-07 DIAGNOSIS — Z79899 Other long term (current) drug therapy: Secondary | ICD-10-CM | POA: Diagnosis not present

## 2015-08-07 DIAGNOSIS — F17200 Nicotine dependence, unspecified, uncomplicated: Secondary | ICD-10-CM | POA: Diagnosis present

## 2015-08-07 DIAGNOSIS — I110 Hypertensive heart disease with heart failure: Secondary | ICD-10-CM | POA: Diagnosis present

## 2015-08-07 DIAGNOSIS — Z981 Arthrodesis status: Secondary | ICD-10-CM | POA: Diagnosis not present

## 2015-08-07 DIAGNOSIS — E782 Mixed hyperlipidemia: Secondary | ICD-10-CM | POA: Diagnosis present

## 2015-08-07 DIAGNOSIS — Z9119 Patient's noncompliance with other medical treatment and regimen: Secondary | ICD-10-CM

## 2015-08-07 DIAGNOSIS — I5032 Chronic diastolic (congestive) heart failure: Secondary | ICD-10-CM | POA: Diagnosis present

## 2015-08-07 DIAGNOSIS — I5033 Acute on chronic diastolic (congestive) heart failure: Secondary | ICD-10-CM | POA: Diagnosis present

## 2015-08-07 DIAGNOSIS — I5031 Acute diastolic (congestive) heart failure: Secondary | ICD-10-CM | POA: Diagnosis not present

## 2015-08-07 DIAGNOSIS — E119 Type 2 diabetes mellitus without complications: Secondary | ICD-10-CM | POA: Diagnosis present

## 2015-08-07 DIAGNOSIS — F17210 Nicotine dependence, cigarettes, uncomplicated: Secondary | ICD-10-CM | POA: Diagnosis present

## 2015-08-07 DIAGNOSIS — R079 Chest pain, unspecified: Secondary | ICD-10-CM

## 2015-08-07 DIAGNOSIS — I161 Hypertensive emergency: Secondary | ICD-10-CM

## 2015-08-07 HISTORY — DX: Type 2 diabetes mellitus without complications: E11.9

## 2015-08-07 HISTORY — DX: Heart failure, unspecified: I50.9

## 2015-08-07 HISTORY — DX: Reserved for inherently not codable concepts without codable children: IMO0001

## 2015-08-07 HISTORY — DX: Essential (primary) hypertension: I10

## 2015-08-07 HISTORY — DX: Other intervertebral disc displacement, lumbar region: M51.26

## 2015-08-07 LAB — LIPID PANEL
CHOL/HDL RATIO: 3.6 ratio
Cholesterol: 135 mg/dL (ref 0–200)
HDL: 38 mg/dL — ABNORMAL LOW (ref >40)
LDL Cholesterol: 58 mg/dL (ref 0–99)
Triglycerides: 194 mg/dL — ABNORMAL HIGH (ref <150)
VLDL: 39 mg/dL (ref 0–40)

## 2015-08-07 LAB — I-STAT TROPONIN, ED: TROPONIN I, POC: 0.02 ng/mL (ref 0.00–0.08)

## 2015-08-07 LAB — CBG MONITORING, ED
GLUCOSE-CAPILLARY: 171 mg/dL — AB (ref 65–99)
Glucose-Capillary: 198 mg/dL — ABNORMAL HIGH (ref 65–99)

## 2015-08-07 LAB — BASIC METABOLIC PANEL
Anion gap: 8 (ref 5–15)
BUN: 14 mg/dL (ref 6–20)
CALCIUM: 8.8 mg/dL — AB (ref 8.9–10.3)
CO2: 30 mmol/L (ref 22–32)
CREATININE: 0.66 mg/dL (ref 0.61–1.24)
Chloride: 97 mmol/L — ABNORMAL LOW (ref 101–111)
GFR calc non Af Amer: >60 mL/min (ref >60)
GLUCOSE: 197 mg/dL — AB (ref 65–99)
Potassium: 3.8 mmol/L (ref 3.5–5.1)
Sodium: 135 mmol/L (ref 135–145)

## 2015-08-07 LAB — GLUCOSE, CAPILLARY
GLUCOSE-CAPILLARY: 153 mg/dL — AB (ref 65–99)
GLUCOSE-CAPILLARY: 209 mg/dL — AB (ref 65–99)

## 2015-08-07 LAB — APTT

## 2015-08-07 LAB — PROTIME-INR
INR: 1.19 (ref 0.00–1.49)
Prothrombin Time: 15.3 seconds — ABNORMAL HIGH (ref 11.6–15.2)

## 2015-08-07 LAB — CBC
HCT: 48.6 % (ref 39.0–52.0)
Hemoglobin: 16.4 g/dL (ref 13.0–17.0)
MCH: 30.6 pg (ref 26.0–34.0)
MCHC: 33.7 g/dL (ref 30.0–36.0)
MCV: 90.7 fL (ref 78.0–100.0)
PLATELETS: 249 10*3/uL (ref 150–400)
RBC: 5.36 MIL/uL (ref 4.22–5.81)
RDW: 13.9 % (ref 11.5–15.5)
WBC: 20.5 10*3/uL — ABNORMAL HIGH (ref 4.0–10.5)

## 2015-08-07 LAB — TROPONIN I: TROPONIN I: 0.04 ng/mL — AB (ref <0.031)

## 2015-08-07 LAB — BRAIN NATRIURETIC PEPTIDE: B Natriuretic Peptide: 196 pg/mL — ABNORMAL HIGH (ref 0.0–100.0)

## 2015-08-07 LAB — HEPARIN LEVEL (UNFRACTIONATED): Heparin Unfractionated: <0.1 IU/mL — ABNORMAL LOW (ref 0.30–0.70)

## 2015-08-07 MED ORDER — HEPARIN BOLUS VIA INFUSION
4000.0000 [IU] | Freq: Once | INTRAVENOUS | Status: AC
  Administered 2015-08-07: 4000 [IU] via INTRAVENOUS

## 2015-08-07 MED ORDER — HEPARIN BOLUS VIA INFUSION
2500.0000 [IU] | Freq: Once | INTRAVENOUS | Status: AC
  Administered 2015-08-07: 2500 [IU] via INTRAVENOUS
  Filled 2015-08-07: qty 2500

## 2015-08-07 MED ORDER — MODAFINIL 100 MG PO TABS
200.0000 mg | ORAL_TABLET | Freq: Every day | ORAL | Status: DC
  Administered 2015-08-07 – 2015-08-08 (×2): 200 mg via ORAL
  Filled 2015-08-07: qty 1
  Filled 2015-08-07: qty 2

## 2015-08-07 MED ORDER — HEPARIN (PORCINE) IN NACL 100-0.45 UNIT/ML-% IJ SOLN
1550.0000 [IU]/h | INTRAMUSCULAR | Status: DC
  Administered 2015-08-07: 1100 [IU]/h via INTRAVENOUS
  Administered 2015-08-08: 1350 [IU]/h via INTRAVENOUS
  Filled 2015-08-07 (×2): qty 250

## 2015-08-07 MED ORDER — SODIUM CHLORIDE 0.9% FLUSH: 3.0000 mL | INTRAVENOUS | Status: DC | PRN

## 2015-08-07 MED ORDER — METFORMIN HCL 500 MG PO TABS
500.0000 mg | ORAL_TABLET | Freq: Two times a day (BID) | ORAL | Status: DC
  Administered 2015-08-07: 500 mg via ORAL
  Filled 2015-08-07: qty 1

## 2015-08-07 MED ORDER — SODIUM CHLORIDE 0.9 % IV SOLN: 250.0000 mL | INTRAVENOUS | Status: DC | PRN

## 2015-08-07 MED ORDER — LISINOPRIL 2.5 MG PO TABS
2.5000 mg | ORAL_TABLET | Freq: Every day | ORAL | Status: DC
  Administered 2015-08-07: 2.5 mg via ORAL

## 2015-08-07 MED ORDER — METOPROLOL TARTRATE 12.5 MG HALF TABLET
12.5000 mg | ORAL_TABLET | Freq: Two times a day (BID) | ORAL | Status: DC
  Administered 2015-08-07 – 2015-08-08 (×4): 12.5 mg via ORAL
  Filled 2015-08-07 (×4): qty 1

## 2015-08-07 MED ORDER — MAGNESIUM OXIDE 400 (241.3 MG) MG PO TABS
ORAL_TABLET | Freq: Every day | ORAL | Status: DC
  Administered 2015-08-07 – 2015-08-08 (×2): 400 mg via ORAL
  Filled 2015-08-07 (×2): qty 1

## 2015-08-07 MED ORDER — DIPHENHYDRAMINE HCL 25 MG PO TABS
100.0000 mg | ORAL_TABLET | Freq: Every day | ORAL | Status: DC
  Administered 2015-08-07 – 2015-08-08 (×2): 100 mg via ORAL
  Filled 2015-08-07 (×4): qty 4

## 2015-08-07 MED ORDER — NITROGLYCERIN IN D5W 200-5 MCG/ML-% IV SOLN
0.0000 ug/min | INTRAVENOUS | Status: DC
  Administered 2015-08-07: 200 ug/min via INTRAVENOUS
  Filled 2015-08-07: qty 250

## 2015-08-07 MED ORDER — INSULIN ASPART 100 UNIT/ML ~~LOC~~ SOLN
0.0000 [IU] | Freq: Three times a day (TID) | SUBCUTANEOUS | Status: DC
  Administered 2015-08-07 (×2): 3 [IU] via SUBCUTANEOUS
  Administered 2015-08-08: 5 [IU] via SUBCUTANEOUS
  Administered 2015-08-08: 2 [IU] via SUBCUTANEOUS
  Administered 2015-08-09: 3 [IU] via SUBCUTANEOUS
  Filled 2015-08-07: qty 1

## 2015-08-07 MED ORDER — HEPARIN SODIUM (PORCINE) 5000 UNIT/ML IJ SOLN
5000.0000 [IU] | Freq: Three times a day (TID) | INTRAMUSCULAR | Status: DC
  Administered 2015-08-07: 5000 [IU] via SUBCUTANEOUS
  Filled 2015-08-07: qty 1

## 2015-08-07 MED ORDER — PANTOPRAZOLE SODIUM 40 MG PO TBEC
40.0000 mg | DELAYED_RELEASE_TABLET | Freq: Every day | ORAL | Status: DC
  Administered 2015-08-07 – 2015-08-08 (×2): 40 mg via ORAL
  Filled 2015-08-07 (×2): qty 1

## 2015-08-07 MED ORDER — ATORVASTATIN CALCIUM 40 MG PO TABS
80.0000 mg | ORAL_TABLET | Freq: Every day | ORAL | Status: DC
  Administered 2015-08-07 – 2015-08-08 (×2): 80 mg via ORAL
  Filled 2015-08-07 (×3): qty 2

## 2015-08-07 MED ORDER — FUROSEMIDE 10 MG/ML IJ SOLN: 40.0000 mg | Freq: Two times a day (BID) | INTRAMUSCULAR | Status: DC

## 2015-08-07 MED ORDER — NITROGLYCERIN 0.4 MG SL SUBL
SUBLINGUAL_TABLET | SUBLINGUAL | Status: AC
  Administered 2015-08-07: 0.4 mg
  Filled 2015-08-07: qty 1

## 2015-08-07 MED ORDER — ACETAMINOPHEN 325 MG PO TABS: 650.0000 mg | ORAL_TABLET | Freq: Four times a day (QID) | ORAL | Status: DC | PRN

## 2015-08-07 MED ORDER — GABAPENTIN 300 MG PO CAPS
300.0000 mg | ORAL_CAPSULE | Freq: Every day | ORAL | Status: DC
  Administered 2015-08-07 – 2015-08-08 (×2): 300 mg via ORAL
  Filled 2015-08-07 (×2): qty 1

## 2015-08-07 MED ORDER — SODIUM CHLORIDE 0.9% FLUSH
3.0000 mL | Freq: Two times a day (BID) | INTRAVENOUS | Status: DC
  Administered 2015-08-07 – 2015-08-08 (×2): 3 mL via INTRAVENOUS

## 2015-08-07 MED ORDER — NITROGLYCERIN 0.4 MG SL SUBL
0.4000 mg | SUBLINGUAL_TABLET | Freq: Once | SUBLINGUAL | Status: AC
  Administered 2015-08-07: 0.4 mg via SUBLINGUAL

## 2015-08-07 MED ORDER — ATORVASTATIN CALCIUM 20 MG PO TABS: 20.0000 mg | ORAL_TABLET | Freq: Every day | ORAL | Status: DC

## 2015-08-07 MED ORDER — ONDANSETRON HCL 4 MG/2ML IJ SOLN
INTRAMUSCULAR | Status: AC
  Administered 2015-08-07: 4 mg
  Filled 2015-08-07: qty 2

## 2015-08-07 MED ORDER — HYDROCODONE-ACETAMINOPHEN 10-325 MG PO TABS: 1.0000 | ORAL_TABLET | Freq: Four times a day (QID) | ORAL | Status: DC | PRN

## 2015-08-07 MED ORDER — POTASSIUM CHLORIDE CRYS ER 20 MEQ PO TBCR
40.0000 meq | EXTENDED_RELEASE_TABLET | Freq: Every day | ORAL | Status: DC
  Administered 2015-08-07 – 2015-08-08 (×2): 40 meq via ORAL
  Filled 2015-08-07 (×2): qty 2

## 2015-08-07 MED ORDER — ACETAMINOPHEN 650 MG RE SUPP: 650.0000 mg | Freq: Four times a day (QID) | RECTAL | Status: DC | PRN

## 2015-08-07 MED ORDER — LISINOPRIL 2.5 MG PO TABS
2.5000 mg | ORAL_TABLET | Freq: Every day | ORAL | Status: DC
  Administered 2015-08-07 – 2015-08-08 (×2): 2.5 mg via ORAL
  Filled 2015-08-07 (×3): qty 1

## 2015-08-07 MED ORDER — NICOTINE 21 MG/24HR TD PT24
21.0000 mg | MEDICATED_PATCH | Freq: Once | TRANSDERMAL | Status: AC
  Administered 2015-08-07: 21 mg via TRANSDERMAL
  Filled 2015-08-07: qty 1

## 2015-08-07 MED ORDER — ADULT MULTIVITAMIN W/MINERALS CH
1.0000 | ORAL_TABLET | Freq: Every day | ORAL | Status: DC
  Administered 2015-08-07 – 2015-08-08 (×2): 1 via ORAL
  Filled 2015-08-07 (×2): qty 1

## 2015-08-07 MED ORDER — FUROSEMIDE 10 MG/ML IJ SOLN
60.0000 mg | Freq: Two times a day (BID) | INTRAMUSCULAR | Status: AC
  Administered 2015-08-07 – 2015-08-08 (×4): 60 mg via INTRAVENOUS
  Filled 2015-08-07 (×4): qty 6

## 2015-08-07 MED ORDER — SODIUM CHLORIDE 0.9% FLUSH
3.0000 mL | Freq: Two times a day (BID) | INTRAVENOUS | Status: DC
  Administered 2015-08-07 – 2015-08-08 (×3): 3 mL via INTRAVENOUS

## 2015-08-07 MED ORDER — NITROGLYCERIN 0.4 MG SL SUBL: 0.4000 mg | SUBLINGUAL_TABLET | SUBLINGUAL | Status: DC | PRN

## 2015-08-07 MED ORDER — ASPIRIN EC 81 MG PO TBEC
81.0000 mg | DELAYED_RELEASE_TABLET | Freq: Every day | ORAL | Status: DC
  Administered 2015-08-07 – 2015-08-08 (×2): 81 mg via ORAL
  Filled 2015-08-07 (×2): qty 1

## 2015-08-07 MED ORDER — SODIUM CHLORIDE 0.9 % IV SOLN
INTRAVENOUS | Status: DC
  Administered 2015-08-08: 06:00:00 via INTRAVENOUS

## 2015-08-07 NOTE — ED Notes (Signed)
Pt ambulated to restroom on room air. O2 saturation upon returning to room 83%. Pt c/o feeling lightheaded. Pt placed on O2 at 2L, O2 saturation increased to 96%.

## 2015-08-07 NOTE — ED Notes (Signed)
Pt requesting to go outside and smoke. Pt informed this is a smoke free facility and pt may not go out and smoke with saline lock. Nicotine patch ordered.

## 2015-08-07 NOTE — ED Notes (Signed)
Bipap removed per EDP. Pt placed on O2 at 3l. O2 saturation 93%.

## 2015-08-07 NOTE — Consult Note (Signed)
CARDIOLOGY CONSULT NOTE   Patient ID: Jonathan Gilmore MRN: [id number] DOB/AGE: [birth year] 65 y.o.  Admit Date: 08/07/2015 Referring Physician: TRH-Chiu Primary Physician: Octavio Graves, DO Consulting Cardiologist: Rozann Lesches MD Primary Cardiologist: Carlyle Dolly MD Reason for Consultation: Acute Pulmonary Edema, unstable angina  Clinical Summary Jonathan Gilmore is a 65 y.o.male with history of multivessel coronary artery disease by chest CT 2014. Stress test completed January 2015 demonstrated diaphragmatic attenuation with probable small anteroseptal scar and LVEF 37% (LVEF was normal by echocardiogram). He has been managed medically by Dr. Harl Bowie. Additional history includes obstructive sleep apnea, ongoing tobacco abuse, and hyperlipidemia.  He presented to the emergency room after awakening suddenly in the middle of the night with severe dyspnea, PND and orthopnea. He also felt substernal chest pressure. Due to ongoing symptoms he presented to the emergency room. On arrival to the emergency room was found to be hypertensive with a blood pressure 207/107, heart rate 126, O2 sat 74%, he was afebrile. Initial troponin 0.04. BNP 196.0. Chest x-ray revealed moderate CHF, prominent bilateral interstitial opacities consistent with pulmonary edema. EKG revealed normal sinus rhythm with T-wave inversion in inferior lateral leads which is new compared to prior EKG in December 2016.Marland Kitchen  He was treated with sublingual nitroglycerin, given lisinopril 2.5 mg by mouth aspirin 81 mg, Lasix 60 mg 1, given one IV dose of heparin 5000 units, dose of metformin, Protonix, and a transdermal nicotine patch was placed. He is currently feeling better has been diuresing.  He states he has been drinking a lot of unsweetened ice tea. 3-4 L over the last few months. Has had no lower extremity edema, chest pain, or weight gain up to this point. He has been fatigued with exertion. He reports compliance with his  medications.  Allergies  Allergen Reactions  . Morphine And Related Nausea And Vomiting    Medications Scheduled Medications: . aspirin EC  81 mg Oral Daily  . atorvastatin  80 mg Oral q1800  . furosemide  60 mg Intravenous BID  . gabapentin  300 mg Oral QHS  . heparin  4,000 Units Intravenous Once  . insulin aspart  0-15 Units Subcutaneous TID WC  . lisinopril  2.5 mg Oral Daily  . lisinopril  2.5 mg Oral Daily  . metoprolol tartrate  12.5 mg Oral BID  . modafinil  200 mg Oral Daily  . nicotine  21 mg Transdermal Once  . pantoprazole  40 mg Oral Daily  . potassium chloride  40 mEq Oral Daily  . sodium chloride flush  3 mL Intravenous Q12H    Infusions: . heparin    . nitroGLYCERIN Stopped (08/07/15 0625)    PRN Medications: acetaminophen **OR** acetaminophen, HYDROcodone-acetaminophen, nitroGLYCERIN   Past Medical History  Diagnosis Date  . Essential hypertension   . Sleep apnea   . Type 2 diabetes mellitus ([place])   . Back pain   . MRSA (methicillin resistant staph aureus) culture positive   . Abnormal cardiovascular stress test 03/2013  . Diastolic dysfunction   . Coronary artery disease     Multivessel with significant LAD involvement by chest CT 2014  . Lumbar herniated disc     L4-L5    Past Surgical History  Procedure Laterality Date  . Bone spur removed  1997    Left shoulder  . Hemorrhoid surgery    . Anterior cervical decomp/discectomy fusion  01/18/2011    Procedure: ANTERIOR CERVICAL DECOMPRESSION/DISCECTOMY FUSION 2 LEVELS;  Surgeon: Cooper Render Pool;  Location: [facility];  Service: Neurosurgery;  Laterality: N/A;  anterior cervical discectomy and fusion with allograft and plating, cervical five-six, cervical six-seven  . Carpal tunnel release Right   . Esophagogastroduodenoscopy N/A 04/04/2015    Procedure: ESOPHAGOGASTRODUODENOSCOPY (EGD);  Surgeon: Rogene Houston, MD;  Location: [facility];  Service: Endoscopy;  Laterality: N/A;  1240  .  Esophageal dilation N/A 04/04/2015    Procedure: ESOPHAGEAL DILATION;  Surgeon: Rogene Houston, MD;  Location: [facility];  Service: Endoscopy;  Laterality: N/A;  . Biopsy N/A 04/04/2015    Procedure: BIOPSY;  Surgeon: Rogene Houston, MD;  Location: [facility];  Service: Endoscopy;  Laterality: N/A;    Family History  Problem Relation Age of Onset  . Lung cancer Mother   . Diabetes Father   . Diabetes Sister   . Cushing syndrome Sister   . Hepatitis B Father     Social History Jonathan Gilmore reports that he has been smoking Cigarettes.  He has a 50 pack-year smoking history. He has quit using smokeless tobacco. Jonathan Gilmore reports that he does not drink alcohol.  Review of Systems Complete review of systems are found to be negative unless outlined in H&P above. Exertional fatigue for several months.  Physical Examination Blood pressure 129/71, pulse 91, temperature 98.8 F (37.1 C), temperature source Axillary, resp. rate 18, SpO2 94 %. No intake or output data in the 24 hours ending 08/07/15 1230  Telemetry: Sinus rhythm.  GEN: Overweight male, no acute distress, wearing oxygen. HEENT: Conjunctiva and lids normal, oropharynx clear. Neck: Supple, no elevated JVP or carotid bruits, no thyromegaly. Lungs: Bilateral crackles in the middle lobes, no wheezes or rhonchi. Cardiac: Regular rate and rhythm, no S3 or significant systolic murmur, no pericardial rub. Abdomen: Soft, nontender, bowel sounds present, no guarding or rebound. Extremities: Mild ankle edema, distal pulses 2+. Skin: Warm and dry. Musculoskeletal: No kyphosis. Neuropsychiatric: Alert and oriented x3, affect grossly appropriate.  Prior Cardiac Testing/Procedures 1. NM Stress Test Myocardial perfusion imaging  Raw images showed appropriate myocardial tracer uptake. There was a medium sized fixed defect in the inferior wall seen in the resting and post stress images. The anteroseptal wall shows a small  fixed defect. Overall wall motion was moderately decreased, with more pronounced hypokinesis of the anteroseptal wall. Based on these findings, the inferior wall defect appears to be most likely secondary to subdiaphragmatic attenuation. The anteroseptal wall is suggestive of possible prior infarction.  Gated imaging showed an end-diastolic volume of [id number] mL, end systolic volume of 99 mL, left ventricular ejection fraction of 37%, t.i.d. 1.04.  IMPRESSION: 1. Abnormal exercise myocardial perfusion imaging stress test  2. Inferior wall defect likely due to subdiaphragmatic attenuation, its wall motion is similar to other areas without a perfusion defect. 3. Likely small anteroseptal infract, this area is more hypokinetic than the rest of the myocardium 4.Low left ventricular systolic function with global hypokinesis 5.  Mildly reduced functional capacity (90% of age and gender Predicted) 6.  Overall increased risk for major cardiac events based on low ejection fraction. There is no current myocardium at jeopardy.  Echocardiogram 02/25/2013 Left ventricle: The cavity size was normal. There was mild concentric hypertrophy. Systolic function was normal. The estimated ejection fraction was in the range of 55% to 60%. Wall motion was normal; there were no regional wall motion abnormalities. There is mildly asynchronous contraction consistent with intraventricular conduction delay (IVCD) or bundle branch block. Doppler parameters are consistent with  abnormal left ventricular relaxation (grade 1 diastolic dysfunction). - Aortic valve: Cusp separation was mildly reduced. There was mild to moderate stenosis. - Left atrium: The atrium was moderately dilated.  Lab Results  Basic Metabolic Panel:  Recent Labs Lab 08/07/15 0509  NA 135  K 3.8  CL 97*  CO2 30  GLUCOSE 197*  BUN 14  CREATININE 0.66  CALCIUM 8.8*   CBC:  Recent Labs Lab 08/07/15 0509   WBC 20.5*  HGB 16.4  HCT 48.6  MCV 90.7  PLT 249   Cardiac Enzymes:  Recent Labs Lab 08/07/15 1015  TROPONINI 0.04*   BNP: 196.0  Radiology: Dg Chest Port 1 View  08/07/2015  CLINICAL DATA:  Midsternal chest pain. Shortness of breath. Symptoms for 2 hours. EXAM: PORTABLE CHEST 1 VIEW COMPARISON:  09/19/2013 FINDINGS: Cardiomediastinal contours are unchanged, mild cardiomegaly. Prominent bilateral interstitial opacities consistent pulmonary edema. No large pleural effusion. No confluent airspace disease. Probable bibasilar atelectasis. No pneumothorax. IMPRESSION: Moderate CHF. Electronically Signed   By: Jeb Levering M.D.   On: 08/07/2015 05:29    ECG:  Sinus tachycardia with diffuse nonspecific ST segment depression, increased voltage, ischemia not excluded although prior tracings with similar changes.  Impression and Recommendations  1. Acute Pulmonary Edema: The patient has been drinking a good bit of unsweetened iced tea, (self-reported 3-4 L a day, works outdoors). His presentation was much more sudden onset however with marked shortness of breath and evidence of pulmonary edema by chest x-ray. The patient has been given IV Lasix 60 mg times one has began to diuresis. He is breathing better, but remains on oxygen. Continue IV Lasix 60 mg every 12 hours. Strict I&O daily weight.  2. Unstable Angina:  Patient with sudden onset of chest pressure which awoke him at night with known history of coronary artery disease. Initial troponin negative. EKG revealing inferior lateral ST depression. Will start the patient on low-dose beta blocker metoprolol 12.5 mg twice a day, lisinopril 2.5 mg daily, add atorvastatin 80 mg daily, begin heparin drip.  We'll plan transfer to [facility] for cardiac catheterization tomorrow.  3. Coronary Artery Disease: No prior cardiac catheterization results available, noted CT scan of the chest on 03/07/2013 with three-vessel CAD, severe LAD  disease. Continued risk factors for progressive coronary artery disease to include tobacco abuse, diabetes, and hypertension. Transfer to [facility] for catheterization in the setting unstable angina.  4. Diabetes: Currently on metformin. Will hold prior to catheterization. Continue insulin per sliding scale.  5. Hypertension: Noted on initial presentation to ER. Currently well-controlled.  6. Ongoing tobacco abuse: Smoking cessation is strongly recommended.  Signed: Phill Myron. Lawrence NP [place]  08/07/2015, 12:30 PM Co-Sign MD  Attending note:  Patient seen and examined. Reviewed records and updated the chart. Case discussed with Ms. Lawrence NP. Jonathan Gilmore presents to the hospital after suddenly awakening at night with marked shortness of breath associated with diaphoresis and chest tightness. Breathing exercises did not help and he came to the ER where he was found to be hypertensive and tachycardic as well as hypoxic, chest x-ray demonstrating pulmonary edema. BNP 196 and troponin I 0.04. 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 He has had exertional fatigue for several months, but the present symptoms are new. He has history of multivessel CAD most significant in the LAD distribution based on previous chest CT imaging from 2014. He did have an  interval Myoview in 2015 demonstrating a component of anterior scar and diaphragmatic attenuation, LVEF 37% (had been normal by echocardiogram previously). Last seen by Dr. Harl Bowie in December 2016 and he was managed medically.  On examination he indicated breathing more easily, had been given nitroglycerin, Lasix, and dose of ACE inhibitor. Lungs exhibit decreased breath sounds with scattered crackles at the bases, no wheezing. Cardiac exam with RRR, no gallop but positive S4. Mild ankle edema noted. Additional lab work showed creatinine 0.7, LDL 58, leukocytosis, and hemoglobin  16.4. ECG showed sinus rhythm with diffuse ST segment abnormalities and increased goal voltage, somewhat more prominent compared to prior tracings.  Presentation concerning for unstable angina with acute pulmonary edema although initial troponin I levels are not substantially abnormal. In light of his history of previously documented multivessel distribution CAD, recommendation is to proceed with transfer to [facility] and have a cardiac catheterization arranged for evaluation of coronary anatomy and assessment of revascularization options. After discussing the risks and benefits, he is in agreement to proceed. Will continue aspirin, heparin, nitroglycerin, start low-dose beta blocker, and continue Lasix. He is already on statin therapy at home. Follow-up echocardiogram.  Satira Sark, M.D., F.A.C.C.

## 2015-08-07 NOTE — Progress Notes (Signed)
ANTICOAGULATION CONSULT NOTE - Initial Consult  Pharmacy Consult for HEPARIN Indication: chest pain/ACS  Allergies  Allergen Reactions  . Morphine And Related Nausea And Vomiting   Patient Measurements:      Last recorded weight = 101Kg Vital Signs: Temp: 98.8 F (37.1 C) (05/25 1201) Temp Source: Axillary (05/25 0518) BP: 129/71 mmHg (05/25 1201) Pulse Rate: 91 (05/25 1201)  Labs:  Recent Labs  08/07/15 0509 08/07/15 1015  HGB 16.4  --   HCT 48.6  --   PLT 249  --   CREATININE 0.66  --   TROPONINI  --  0.04*    CrCl cannot be calculated (Unknown ideal weight.).  Medical History: Past Medical History  Diagnosis Date  . Essential hypertension   . Sleep apnea   . Type 2 diabetes mellitus ([place])   . Back pain   . MRSA (methicillin resistant staph aureus) culture positive   . Abnormal cardiovascular stress test 03/2013  . Diastolic dysfunction   . Coronary artery disease     Multivessel with significant LAD involvement by chest CT 2014  . Lumbar herniated disc     L4-L5   Medications:   (Not in a hospital admission)  Assessment: 65yo male with known h/o CAD.  Pt presents to ED with c/o CP.  Asked to initiate Heparin for ACS.  Pt did receive a dose of SQ Heparin 5000 units earlier this morning, should have minimal effect on labs.    Goal of Therapy:  Heparin level 0.3-0.7 units/ml Monitor platelets by anticoagulation protocol: Yes   Plan:  Heparin 4000 units IV bolus now x 1 Heparin infusion at 1100 units/hr Heparin level in 6-8 hrs then daily CBC daily while on Heparin  [place] Crane, Ahriyah Vannest A 08/07/2015,12:20 PM

## 2015-08-07 NOTE — H&P (Addendum)
History and Physical    ROMI EDGECOMB [id number] DOB: 1950-03-18 DOA: 08/07/2015  PCP: Octavio Graves, DO  Patient coming from: Home  Chief Complaint: SOB  HPI: Jonathan Gilmore is a 65 y.o. male with medical history significant of diastolic chf, htn, OSA, DM who presented to the ED with complaints of worsening sob that started on the evening prior to admission. Denies fevers, cough. Did report mild chest discomfort with associated nausea and diaphoresis. Currently asymptomatic. On further questioning, patient admits to drinking 3-4 L of iced tea per day, making little urine. Pt is also s/p abnormal stress test in 2015 with findings concerning for a small anteroseptal infarct with no further cardiac w/u done as pt was asymptomatic at that time. In 12/16, patient was seen by Dr. Harl Bowie for recurrent chest pain, later thought to be related to a GI source. Patient was referred to GI where he was found to have gastritis on a 1/17 EGD.  ED Course: In the ED, CXR was notable for findings suggestive of CHF. Patient was started on NTG infusion with bipap. Initial troponin was neg. BNP was elevated at just under 200. Hospitalist was consulted for consideration for admission.  Review of Systems:  Review of Systems  Constitutional: Negative for fever, chills and weight loss.  HENT: Negative for congestion, ear discharge and tinnitus.   Eyes: Negative for double vision, discharge and redness.  Respiratory: Positive for shortness of breath. Negative for cough.   Cardiovascular: Positive for chest pain. Negative for claudication.  Gastrointestinal: Negative for vomiting and constipation.  Genitourinary: Negative for urgency and frequency.  Musculoskeletal: Negative for back pain and falls.  Neurological: Negative for tingling, tremors and loss of consciousness.  Psychiatric/Behavioral: Negative for hallucinations and memory loss.     Past Medical History  Diagnosis Date  . Hypertension   . Sleep  apnea     tested at sleep apnea at [facility]  . Diabetes mellitus     since 1993  . Back pain     herniated disk L4-L5  . MRSA (methicillin resistant staph aureus) culture positive   . Abnormal cardiovascular stress test 03/2013  . Diastolic dysfunction   . Coronary artery disease     Past Surgical History  Procedure Laterality Date  . Bone spur removed      back in 1997, left shoulder  . Hemorrhoid surgery    . Anterior cervical decomp/discectomy fusion  01/18/2011    Procedure: ANTERIOR CERVICAL DECOMPRESSION/DISCECTOMY FUSION 2 LEVELS;  Surgeon: Cooper Render Pool;  Location: [place] [facility];  Service: Neurosurgery;  Laterality: N/A;  anterior cervical discectomy and fusion with allograft and plating, cervical five-six, cervical six-seven  . Carpal tunnel release      right  . Esophagogastroduodenoscopy N/A 04/04/2015    Procedure: ESOPHAGOGASTRODUODENOSCOPY (EGD);  Surgeon: Rogene Houston, MD;  [facility];  Service: Endoscopy;  Laterality: N/A;  1240  . Esophageal dilation N/A 04/04/2015    Procedure: ESOPHAGEAL DILATION;  Surgeon: Rogene Houston, MD;  [facility];  Service: Endoscopy;  Laterality: N/A;  . Biopsy N/A 04/04/2015    Procedure: BIOPSY;  Surgeon: Rogene Houston, MD;  [facility];  Service: Endoscopy;  Laterality: N/A;     reports that he has been smoking Cigarettes.  He has a 50 pack-year smoking history. He has quit using smokeless tobacco. He reports that he does not drink alcohol or use illicit drugs.  Allergies  Allergen Reactions  .  Morphine And Related Nausea And Vomiting    Family History  Problem Relation Age of Onset  . Cancer Mother   . Diabetes Father   . Diabetes Sister   . Cushing syndrome Sister     Prior to Admission medications   Medication Sig Start Date End Date Taking? Authorizing Provider  aspirin EC 81 MG tablet Take 81 mg by mouth daily.     Historical Provider, MD  Flaxseed, Linseed, (FLAXSEED OIL  PO) Take 1 tablet by mouth daily.     Historical Provider, MD  gabapentin (NEURONTIN) 300 MG capsule Take 1 capsule (300 mg total) by mouth at bedtime. 09/20/13   Radene Gunning, NP  HYDROcodone-acetaminophen (NORCO) 10-325 MG per tablet Take 1-2 tablets by mouth every 6 (six) hours as needed for severe pain.     Historical Provider, MD  metFORMIN (GLUCOPHAGE) 500 MG tablet Take 500 mg by mouth 2 (two) times daily.     Historical Provider, MD  modafinil (PROVIGIL) 200 MG tablet Take 1 tablet (200 mg total) by mouth daily. 09/20/13   Radene Gunning, NP  Multiple Vitamin (MULTIVITAMIN WITH MINERALS) TABS Take 1 tablet by mouth daily.    Historical Provider, MD  nitroGLYCERIN (NITROSTAT) 0.4 MG SL tablet Place 1 tablet (0.4 mg total) under the tongue every 5 (five) minutes as needed for chest pain. 03/06/13   Lendon Colonel, NP  omeprazole (PRILOSEC) 40 MG capsule Take 1 capsule (40 mg total) by mouth daily. 03/20/15   Butch Penny, NP    Physical Exam: Filed Vitals:   08/07/15 0630 08/07/15 0640 08/07/15 0715 08/07/15 0734  BP: 115/78 118/73 123/72   Pulse: 88 87 82 78  Temp:      TempSrc:      Resp: 17 15 17 12   SpO2: 97% 98% 98% 99%      Constitutional: NAD, calm, comfortable Filed Vitals:   08/07/15 0630 08/07/15 0640 08/07/15 0715 08/07/15 0734  BP: 115/78 118/73 123/72   Pulse: 88 87 82 78  Temp:      TempSrc:      Resp: 17 15 17 12   SpO2: 97% 98% 98% 99%   Eyes: PERRL, lids and conjunctivae normal ENMT: Mucous membranes are moist. Posterior pharynx clear of any exudate or lesions.Normal dentition.  Neck: normal, supple, no masses, no thyromegaly Respiratory: clear to auscultation bilaterally, no wheezing, no crackles. Normal respiratory effort. No accessory muscle use.  Cardiovascular: Regular rate and rhythm Abdomen: no tenderness, no masses palpated. No hepatosplenomegaly. Bowel sounds positive.  Musculoskeletal: no clubbing / cyanosis. No joint deformity upper and lower  extremities. Good ROM, no contractures. Normal muscle tone.  Skin: no rashes, lesions, ulcers. No induration Neurologic: CN 2-12 grossly intact. Sensation intact, DTR normal. Strength 5/5 in all 4.  Psychiatric: Normal judgment and insight. Alert and oriented x 3. Normal mood.   Labs on Admission: I have personally reviewed following labs and imaging studies  CBC:  Recent Labs Lab 08/07/15 0509  WBC 20.5*  HGB 16.4  HCT 48.6  MCV 90.7  PLT 0000000   Basic Metabolic Panel:  Recent Labs Lab 08/07/15 0509  NA 135  K 3.8  CL 97*  CO2 30  GLUCOSE 197*  BUN 14  CREATININE 0.66  CALCIUM 8.8*   GFR: CrCl cannot be calculated (Unknown ideal weight.). Liver Function Tests: No results for input(s): AST, ALT, ALKPHOS, BILITOT, PROT, ALBUMIN in the last 168 hours. No results for input(s): LIPASE, AMYLASE in the  last 168 hours. No results for input(s): AMMONIA in the last 168 hours. Coagulation Profile: No results for input(s): INR, PROTIME in the last 168 hours. Cardiac Enzymes: No results for input(s): CKTOTAL, CKMB, CKMBINDEX, TROPONINI in the last 168 hours. BNP (last 3 results) No results for input(s): PROBNP in the last 8760 hours. HbA1C: No results for input(s): HGBA1C in the last 72 hours. CBG:  Recent Labs Lab 08/07/15 0739  GLUCAP 198*   Lipid Profile: No results for input(s): CHOL, HDL, LDLCALC, TRIG, CHOLHDL, LDLDIRECT in the last 72 hours. Thyroid Function Tests: No results for input(s): TSH, T4TOTAL, FREET4, T3FREE, THYROIDAB in the last 72 hours. Anemia Panel: No results for input(s): VITAMINB12, FOLATE, FERRITIN, TIBC, IRON, RETICCTPCT in the last 72 hours. Urine analysis:    Component Value Date/Time   COLORURINE YELLOW 08/23/2011 0807   APPEARANCEUR CLEAR 08/23/2011 0807   LABSPEC 1.015 08/23/2011 0807   PHURINE 7.0 08/23/2011 0807   GLUCOSEU NEGATIVE 08/23/2011 0807   HGBUR NEGATIVE 08/23/2011 0807   BILIRUBINUR NEGATIVE 08/23/2011 0807    KETONESUR NEGATIVE 08/23/2011 0807   PROTEINUR NEGATIVE 08/23/2011 0807   UROBILINOGEN 0.2 08/23/2011 0807   NITRITE NEGATIVE 08/23/2011 0807   LEUKOCYTESUR NEGATIVE 08/23/2011 0807   Sepsis Labs: !!!!!!!!!!!!!!!!!!!!!!!!!!!!!!!!!!!!!!!!!!!! @LABRCNTIP (procalcitonin:4,lacticidven:4) )No results found for this or any previous visit (from the past 240 hour(s)).   Radiological Exams on Admission: Dg Chest Port 1 View  08/07/2015  CLINICAL DATA:  Midsternal chest pain. Shortness of breath. Symptoms for 2 hours. EXAM: PORTABLE CHEST 1 VIEW COMPARISON:  09/19/2013 FINDINGS: Cardiomediastinal contours are unchanged, mild cardiomegaly. Prominent bilateral interstitial opacities consistent pulmonary edema. No large pleural effusion. No confluent airspace disease. Probable bibasilar atelectasis. No pneumothorax. IMPRESSION: Moderate CHF. Electronically Signed   By: Jeb Levering M.D.   On: 08/07/2015 05:29    Assessment/Plan Active Problems:   DM type 2 (diabetes mellitus, type 2) (HCC)   HTN (hypertension)   Hyperlipidemia   Diastolic CHF (HCC)   OSA (obstructive sleep apnea)   Acute pulmonary edema (HCC)   Acute CHF -Pt reports decreased exercise tolerance, increased orthopnea -CXR findings suggestive of acute CHF -Patient has admitted to over 3-4 L of fluids daily without significant urine output -Will start on scheduled IV lasix -Monitor daily i/o and daily weights -Check 2d echo -Admit to med-tele  Chest pain -Calculated HEART score of 4 -Suspect related to acute CHF -Initial trop neg -Will follow serial trop -EKG with mild ST depressions, but do appear similar compared to 7/15 EKG -On further review, pt is s/p abnormal stress test in 1/15 with concerns for likely small anteroseptal infarct. Further cardiac w/u was not pursued as pt was asymptomatic at that time. Patient was later re-evaluated for chest pain in 12/16 where his symptoms were felt to be related to a GI source.  Patient was referred to GI, found to have gastritis on 1/17 EGD. -Given HEART score and history of abnormal stress test, would consult Cardiology for any further recs at this time  DM2 -Cont home meds -Cont SSI coverage -Check a1c (last was over 10 in 2015)  HTN -Cont home meds -Lisinopril added given concerns of heart failure  HLD -Not on statin -On flaxseed oil prior to admit -Will check lipid panel  OSA -Cont CPAP at night   DVT prophylaxis: heparin subQ Code Status: Full Family Communication: Pt in room Disposition Plan: Uncertain Consults called:  Admission status: inpt, sdu   CHIU, Orpah Melter MD [facility] Pager [phone]  If 7PM-7AM, please contact night-coverage [email or web address] Password [facility]  08/07/2015, 7:51 AM

## 2015-08-07 NOTE — Progress Notes (Signed)
ANTICOAGULATION CONSULT NOTE - Follow Up Consult  Pharmacy Consult for HEPARIN Indication: chest pain/ACS  Allergies  Allergen Reactions  . Morphine And Related Nausea And Vomiting   Patient Measurements: Height: 5\' 8"  (172.7 cm) Weight: 223 lb 4.8 oz (101.288 kg) IBW/kg (Calculated) : 68.4 HEPARIN DW (KG): 90.2  Last recorded weight = 101Kg Vital Signs: Temp: 98.7 F (37.1 C) (05/25 2025) Temp Source: Oral (05/25 2025) BP: 107/50 mmHg (05/25 2025) Pulse Rate: 88 (05/25 2025)  Labs:  Recent Labs  08/07/15 0509 08/07/15 1015 08/07/15 1330 08/07/15 2054  HGB 16.4  --   --   --   HCT 48.6  --   --   --   PLT 249  --   --   --   APTT  --   --  >200*  --   LABPROT  --   --  15.3*  --   INR  --   --  1.19  --   HEPARINUNFRC  --   --   --  <0.10*  CREATININE 0.66  --   --   --   TROPONINI  --  0.04*  --   --     Estimated Creatinine Clearance: 107.7 mL/min (by C-G formula based on Cr of 0.66).  Medical History: Past Medical History  Diagnosis Date  . Essential hypertension   . Sleep apnea   . Type 2 diabetes mellitus ([place])   . Back pain   . MRSA (methicillin resistant staph aureus) culture positive   . Abnormal cardiovascular stress test 03/2013  . Diastolic dysfunction   . Coronary artery disease     Multivessel with significant LAD involvement by chest CT 2014  . Lumbar herniated disc     L4-L5  . CHF (congestive heart failure) ([place])   . Shortness of breath dyspnea    Medications:  Prescriptions prior to admission  Medication Sig Dispense Refill Last Dose  . aspirin EC 81 MG tablet Take 81 mg by mouth daily.    08/06/2015 at Unknown time  . atorvastatin (LIPITOR) 20 MG tablet Take 1 tablet by mouth daily.   08/06/2015 at Unknown time  . CALCIUM PO Take 1 tablet by mouth daily.   08/06/2015 at Unknown time  . Cyanocobalamin (VITAMIN B 12 PO) Take 1 tablet by mouth daily.   08/06/2015 at Unknown time  . diphenhydrAMINE (BENADRYL) 25 MG tablet Take 100 mg by  mouth at bedtime.   08/06/2015 at Unknown time  . Dulaglutide (TRULICITY) 1.5 0000000 SOPN Inject 0.5 mLs into the skin once a week.   Past Week at Unknown time  . Flaxseed, Linseed, (FLAXSEED OIL PO) Take 1 tablet by mouth daily.    08/06/2015 at Unknown time  . gabapentin (NEURONTIN) 300 MG capsule Take 1 capsule (300 mg total) by mouth at bedtime. 30 capsule 0 08/06/2015 at Unknown time  . HYDROcodone-acetaminophen (NORCO) 10-325 MG per tablet Take 1-2 tablets by mouth every 6 (six) hours as needed for severe pain.    unknown  . ibuprofen (ADVIL,MOTRIN) 200 MG tablet Take 800 mg by mouth daily as needed for moderate pain.   unknown  . MAGNESIUM PO Take 1 tablet by mouth daily.   08/06/2015 at Unknown time  . metFORMIN (GLUCOPHAGE) 500 MG tablet Take 500 mg by mouth 2 (two) times daily.    08/06/2015 at Unknown time  . Multiple Vitamin (MULTIVITAMIN WITH MINERALS) TABS Take 1 tablet by mouth daily.   08/06/2015 at Unknown time  .  nitroGLYCERIN (NITROSTAT) 0.4 MG SL tablet Place 1 tablet (0.4 mg total) under the tongue every 5 (five) minutes as needed for chest pain. 25 tablet 4 unknown  . POTASSIUM PO Take 1 tablet by mouth daily.   08/06/2015 at Unknown time  . testosterone cypionate (DEPOTESTOTERONE CYPIONATE) 100 MG/ML injection Inject 100 mg into the muscle every 28 (twenty-eight) days. For IM use only   Past Month at Unknown time  . traMADol (ULTRAM) 50 MG tablet Take 50 mg by mouth 3 (three) times daily as needed for moderate pain.   08/06/2015 at Unknown time  . omeprazole (PRILOSEC) 40 MG capsule Take 1 capsule (40 mg total) by mouth daily. (Patient not taking: Reported on 08/07/2015) 30 capsule 3 Completed Course at Unknown time    Assessment: 65yo male with known h/o CAD.  Pt presented to ED with c/o CP.  Currently on IV Heparin for ACS at 1100 units/hr. HL is undetectable. RN reports no issues with infusion   Goal of Therapy:  Heparin level 0.3-0.7 units/ml Monitor platelets by  anticoagulation protocol: Yes   Plan:  Heparin 2500 units IV bolus now x 1 Heparin infusion at 1350 units/hr Heparin level in 6-8 hrs then daily CBC daily while on Heparin  Albertina Parr, PharmD., BCPS Clinical Pharmacist Pager [phone]

## 2015-08-07 NOTE — ED Notes (Signed)
MD at bedside. 

## 2015-08-07 NOTE — ED Notes (Signed)
Dr. Domenic Polite and Fulton Reek in pharmacy notified of PTT result.

## 2015-08-07 NOTE — ED Notes (Signed)
Pt reports onset of mid sternal chest pressure approx 2 hours ago with sob

## 2015-08-07 NOTE — Progress Notes (Signed)
RT NOTE:  CPAP setup at Bedside, FFM, humidity chamber filled. Pt wears at home and aware how to operate machine. No O2 needed at this time. CPAP 10cm H20 per home settings. RT will monitor. Pt will call if assistance needed.

## 2015-08-07 NOTE — ED Provider Notes (Signed)
CSN: [id number]     Arrival date & time 08/07/15  0459 History   First MD Initiated Contact with Patient 08/07/15 0510     Chief Complaint  Patient presents with  . Chest Pain  . Respiratory Distress     (Consider location/radiation/quality/duration/timing/severity/associated sxs/prior Treatment) HPI 65 year old male who presents with respiratory distress. He has history of CAD, diabetes, and hypertension. States that he has a wife has been in his usual state of health and went to bed feeling well. Woke up in the middle of the night with severe shortness of breath. Denies fevers, cough or recent illness. Reports chest pressure that is associating. Denies lower extremity edema, orthopnea or PND recently.   Past Medical History  Diagnosis Date  . Hypertension   . Sleep apnea     tested at sleep ap[facility] at [facility]  . Diabetes mellitus     since 1993  . Back pain     herniated disk L4-L5  . MRSA (methicillin resistant staph aureus) culture positive   . Abnormal cardiovascular stress test 03/2013  . Diastolic dysfunction   . Coronary artery disease    Past Surgical History  Procedure Laterality Date  . Bone spur removed      back in 1997, left shoulder  . Hemorrhoid surgery    . Anterior cervical decomp/discectomy fusion  01/18/2011    Procedure: ANTERIOR CERVICAL DECOMPRESSION/DISCECTOMY FUSION 2 LEVELS;  Surgeon: Cooper Render Pool;  Location: [place] [facility];  Service: Neurosurgery;  Laterality: N/A;  anterior cervical discectomy and fusion with allograft and plating, cervical five-six, cervical six-seven  . Carpal tunnel release      right  . Esophagogastroduodenoscopy N/A 04/04/2015    Procedure: ESOPHAGOGASTRODUODENOSCOPY (EGD);  Surgeon: Rogene Houston, MD;  [facility];  Service: Endoscopy;  Laterality: N/A;  1240  . Esophageal dilation N/A 04/04/2015    Procedure: ESOPHAGEAL DILATION;  Surgeon: Rogene Houston, MD;  [facility];  Service: Endoscopy;   Laterality: N/A;  . Biopsy N/A 04/04/2015    Procedure: BIOPSY;  Surgeon: Rogene Houston, MD;  [facility];  Service: Endoscopy;  Laterality: N/A;   Family History  Problem Relation Age of Onset  . Cancer Mother   . Diabetes Father   . Diabetes Sister   . Cushing syndrome Sister    Social History  Substance Use Topics  . Smoking status: Current Every Day Smoker -- 1.00 packs/day for 50 years    Types: Cigarettes  . Smokeless tobacco: Former Systems developer     Comment: smoking x 50 yrs  . Alcohol Use: No    Review of Systems 10/14 systems reviewed and are negative other than those stated in the HPI    Allergies  Morphine and related  Home Medications   Prior to Admission medications   Medication Sig Start Date End Date Taking? Authorizing Provider  aspirin EC 81 MG tablet Take 81 mg by mouth daily.     Historical Provider, MD  Flaxseed, Linseed, (FLAXSEED OIL PO) Take 1 tablet by mouth daily.     Historical Provider, MD  gabapentin (NEURONTIN) 300 MG capsule Take 1 capsule (300 mg total) by mouth at bedtime. 09/20/13   Radene Gunning, NP  HYDROcodone-acetaminophen (NORCO) 10-325 MG per tablet Take 1-2 tablets by mouth every 6 (six) hours as needed for severe pain.     Historical Provider, MD  metFORMIN (GLUCOPHAGE) 500 MG tablet Take 500 mg by mouth 2 (two) times daily.  Historical Provider, MD  modafinil (PROVIGIL) 200 MG tablet Take 1 tablet (200 mg total) by mouth daily. 09/20/13   Radene Gunning, NP  Multiple Vitamin (MULTIVITAMIN WITH MINERALS) TABS Take 1 tablet by mouth daily.    Historical Provider, MD  nitroGLYCERIN (NITROSTAT) 0.4 MG SL tablet Place 1 tablet (0.4 mg total) under the tongue every 5 (five) minutes as needed for chest pain. 03/06/13   Lendon Colonel, NP  omeprazole (PRILOSEC) 40 MG capsule Take 1 capsule (40 mg total) by mouth daily. 03/20/15   Terri L Setzer, NP   BP 118/70 mmHg  Pulse 99  Temp(Src) 96.2 F (35.7 C) (Axillary)  Resp 17  SpO2  93% Physical Exam Physical Exam  Nursing note and vitals reviewed. Constitutional: In severe respiratory distress and appears anxious Head: Normocephalic and atraumatic.  Mouth/Throat: Oropharynx is clear and moist.  Neck: Normal range of motion. Neck supple.  Cardiovascular: tachycardic rate and regular rhythm.  no LE edema. Pulmonary/Chest: Tachypnea with accessory muscle usage and conversational dyspnea. Diminished breath sounds throughout with scattered rhonchi. Abdominal: Soft. There is no tenderness. There is no rebound and no guarding.  Musculoskeletal: Normal range of motion.  Neurological: Alert, no facial droop, fluent speech, moves all extremities symmetrically Skin: Skin is warm and dry.  Psychiatric: Cooperative  ED Course  Procedures (including critical care time) Labs Review Labs Reviewed  BASIC METABOLIC PANEL - Abnormal; Notable for the following:    Chloride 97 (*)    Glucose, Bld 197 (*)    Calcium 8.8 (*)    All other components within normal limits  CBC - Abnormal; Notable for the following:    WBC 20.5 (*)    All other components within normal limits  BRAIN NATRIURETIC PEPTIDE - Abnormal; Notable for the following:    B Natriuretic Peptide 196.0 (*)    All other components within normal limits  Randolm Idol, ED    Imaging Review Dg Chest Port 1 View  08/07/2015  CLINICAL DATA:  Midsternal chest pain. Shortness of breath. Symptoms for 2 hours. EXAM: PORTABLE CHEST 1 VIEW COMPARISON:  09/19/2013 FINDINGS: Cardiomediastinal contours are unchanged, mild cardiomegaly. Prominent bilateral interstitial opacities consistent pulmonary edema. No large pleural effusion. No confluent airspace disease. Probable bibasilar atelectasis. No pneumothorax. IMPRESSION: Moderate CHF. Electronically Signed   By: Jeb Levering M.D.   On: 08/07/2015 05:29   I have personally reviewed and evaluated these images and lab results as part of my medical decision-making.   EKG  Interpretation   Date/Time:  Thursday Aug 07 2015 05:52:11 EDT Ventricular Rate:  104 PR Interval:  178 QRS Duration: 87 QT Interval:  328 QTC Calculation: 431 R Axis:   62 Text Interpretation:  Sinus tachycardia Atrial premature complex Left  atrial enlargement Nonspecific repol abnormality, diffuse leads ST  depressions in lateral leads unchagned from prior  Confirmed by Sion Thane MD,  Chiquetta Langner [phone]) on 08/07/2015 5:54:55 AM      CRITICAL CARE Performed by: Forde Dandy   Total critical care time: 40 minutes  Critical care time was exclusive of separately billable procedures and treating other patients.  Critical care was necessary to treat or prevent imminent or life-threatening deterioration.  Critical care was time spent personally by me on the following activities: development of treatment plan with patient and/or surrogate as well as nursing, discussions with consultants, evaluation of patient's response to treatment, examination of patient, obtaining history from patient or surrogate, ordering and performing treatments and interventions, ordering and  review of laboratory studies, ordering and review of radiographic studies, pulse oximetry and re-evaluation of patient's condition.   MDM   Final diagnoses:  Acute pulmonary edema ([place])  Hypertensive emergency    Presenting with sudden onset shortness of breath. On presentation, appears to be in severe respiratory distress. He has tachypnea, accessory muscle usage, and conversational dyspnea. This hypoxic to 74% on room air. Hypertensive w/ SBPs 200. With diminished breath sounds and scattered rhonchi on exam. Presentation suggestive of that of flash pulmonary edema. Placed on BiPAP, received nitroglycerin and nitroglycerin drip. With subsequent improvement in his work of breathing and blood pressure. A chest x-ray with evidence of pulmonary edema. EKG without new ischemia or infarction. Troponin is negative. Discussed with Dr. Marin Comment who  will admit to SDU.    Forde Dandy, MD 08/07/15 [phone]

## 2015-08-08 ENCOUNTER — Encounter (HOSPITAL_COMMUNITY): Payer: Self-pay | Admitting: Cardiovascular Disease

## 2015-08-08 ENCOUNTER — Inpatient Hospital Stay (HOSPITAL_COMMUNITY): Payer: PRIVATE HEALTH INSURANCE

## 2015-08-08 ENCOUNTER — Encounter (HOSPITAL_COMMUNITY)
Admission: EM | Disposition: A | Discharge status: Home / Self Care | Payer: Self-pay | Source: Home / Self Care | Attending: Internal Medicine

## 2015-08-08 DIAGNOSIS — I5031 Acute diastolic (congestive) heart failure: Secondary | ICD-10-CM

## 2015-08-08 DIAGNOSIS — I2510 Atherosclerotic heart disease of native coronary artery without angina pectoris: Secondary | ICD-10-CM

## 2015-08-08 DIAGNOSIS — I509 Heart failure, unspecified: Secondary | ICD-10-CM

## 2015-08-08 HISTORY — PX: CARDIAC CATHETERIZATION: SHX172

## 2015-08-08 LAB — COMPREHENSIVE METABOLIC PANEL
ALBUMIN: 3.7 g/dL (ref 3.5–5.0)
ALK PHOS: 74 U/L (ref 38–126)
ALT: 16 U/L — AB (ref 17–63)
AST: 17 U/L (ref 15–41)
Anion gap: 6 (ref 5–15)
BILIRUBIN TOTAL: 0.6 mg/dL (ref 0.3–1.2)
BUN: 13 mg/dL (ref 6–20)
CHLORIDE: 102 mmol/L (ref 101–111)
CO2: 31 mmol/L (ref 22–32)
Calcium: 8.8 mg/dL — ABNORMAL LOW (ref 8.9–10.3)
Creatinine, Ser: 0.78 mg/dL (ref 0.61–1.24)
GFR calc Af Amer: >60 mL/min (ref >60)
GFR calc non Af Amer: >60 mL/min (ref >60)
Glucose, Bld: 159 mg/dL — ABNORMAL HIGH (ref 65–99)
POTASSIUM: 4 mmol/L (ref 3.5–5.1)
Sodium: 139 mmol/L (ref 135–145)
TOTAL PROTEIN: 6.5 g/dL (ref 6.5–8.1)

## 2015-08-08 LAB — CBC
HEMATOCRIT: 45 % (ref 39.0–52.0)
HEMOGLOBIN: 14.5 g/dL (ref 13.0–17.0)
MCH: 29 pg (ref 26.0–34.0)
MCHC: 32.2 g/dL (ref 30.0–36.0)
MCV: 90 fL (ref 78.0–100.0)
Platelets: 225 10*3/uL (ref 150–400)
RBC: 5 MIL/uL (ref 4.22–5.81)
RDW: 14 % (ref 11.5–15.5)
WBC: 11.4 10*3/uL — AB (ref 4.0–10.5)

## 2015-08-08 LAB — GLUCOSE, CAPILLARY
GLUCOSE-CAPILLARY: 136 mg/dL — AB (ref 65–99)
GLUCOSE-CAPILLARY: 238 mg/dL — AB (ref 65–99)
GLUCOSE-CAPILLARY: 360 mg/dL — AB (ref 65–99)

## 2015-08-08 LAB — POCT I-STAT 3, ART BLOOD GAS (G3+)
ACID-BASE EXCESS: 4 mmol/L — AB (ref 0.0–2.0)
Bicarbonate: 29 mEq/L — ABNORMAL HIGH (ref 20.0–24.0)
O2 Saturation: 95 %
TCO2: 30 mmol/L (ref 0–100)
pCO2 arterial: 45 mmHg (ref 35.0–45.0)
pH, Arterial: 7.417 (ref 7.350–7.450)
pO2, Arterial: 75 mmHg — ABNORMAL LOW (ref 80.0–100.0)

## 2015-08-08 LAB — POCT I-STAT 3, VENOUS BLOOD GAS (G3P V)
Acid-Base Excess: 4 mmol/L — ABNORMAL HIGH (ref 0.0–2.0)
Bicarbonate: 29.7 mEq/L — ABNORMAL HIGH (ref 20.0–24.0)
O2 Saturation: 67 %
PH VEN: 7.411 — AB (ref 7.250–7.300)
TCO2: 31 mmol/L (ref 0–100)
pCO2, Ven: 46.8 mmHg (ref 45.0–50.0)
pO2, Ven: 35 mmHg (ref 31.0–45.0)

## 2015-08-08 LAB — ECHOCARDIOGRAM COMPLETE
Height: 68 in
WEIGHTICAEL: 3409.6 [oz_av]

## 2015-08-08 LAB — PROTIME-INR
INR: 1.12 (ref 0.00–1.49)
PROTHROMBIN TIME: 14.6 s (ref 11.6–15.2)

## 2015-08-08 LAB — HEMOGLOBIN A1C
HEMOGLOBIN A1C: 8.4 % — AB (ref 4.8–5.6)
Mean Plasma Glucose: 194 mg/dL

## 2015-08-08 LAB — TROPONIN I: Troponin I: 0.03 ng/mL (ref <0.031)

## 2015-08-08 LAB — POCT ACTIVATED CLOTTING TIME: Activated Clotting Time: 229 seconds

## 2015-08-08 LAB — HEPARIN LEVEL (UNFRACTIONATED)
HEPARIN UNFRACTIONATED: 0.23 [IU]/mL — AB (ref 0.30–0.70)
HEPARIN UNFRACTIONATED: 0.36 [IU]/mL (ref 0.30–0.70)

## 2015-08-08 SURGERY — RIGHT/LEFT HEART CATH AND CORONARY ANGIOGRAPHY

## 2015-08-08 MED ORDER — IOPAMIDOL (ISOVUE-370) INJECTION 76%
INTRAVENOUS | Status: AC
  Filled 2015-08-08: qty 100

## 2015-08-08 MED ORDER — FENTANYL CITRATE (PF) 100 MCG/2ML IJ SOLN
INTRAMUSCULAR | Status: DC | PRN
  Administered 2015-08-08: 25 ug via INTRAVENOUS

## 2015-08-08 MED ORDER — ZOLPIDEM TARTRATE 5 MG PO TABS
10.0000 mg | ORAL_TABLET | Freq: Every day | ORAL | Status: AC
  Administered 2015-08-08: 10 mg via ORAL
  Filled 2015-08-08: qty 2

## 2015-08-08 MED ORDER — SODIUM CHLORIDE 0.9 % IV SOLN: 250.0000 mL | INTRAVENOUS | Status: DC | PRN

## 2015-08-08 MED ORDER — SODIUM CHLORIDE 0.9% FLUSH
3.0000 mL | Freq: Two times a day (BID) | INTRAVENOUS | Status: DC
  Administered 2015-08-08: 3 mL via INTRAVENOUS

## 2015-08-08 MED ORDER — LIDOCAINE HCL (PF) 1 % IJ SOLN
INTRAMUSCULAR | Status: DC | PRN
  Administered 2015-08-08: 2 mL

## 2015-08-08 MED ORDER — HEPARIN SODIUM (PORCINE) 1000 UNIT/ML IJ SOLN
INTRAMUSCULAR | Status: DC | PRN
  Administered 2015-08-08: 5000 [IU] via INTRAVENOUS

## 2015-08-08 MED ORDER — HEPARIN (PORCINE) IN NACL 2-0.9 UNIT/ML-% IJ SOLN
INTRAMUSCULAR | Status: AC
  Filled 2015-08-08: qty 500

## 2015-08-08 MED ORDER — MIDAZOLAM HCL 2 MG/2ML IJ SOLN
INTRAMUSCULAR | Status: DC | PRN
  Administered 2015-08-08: 2 mg via INTRAVENOUS

## 2015-08-08 MED ORDER — ONDANSETRON HCL 4 MG/2ML IJ SOLN: 4.0000 mg | Freq: Four times a day (QID) | INTRAMUSCULAR | Status: DC | PRN

## 2015-08-08 MED ORDER — LIDOCAINE HCL (PF) 1 % IJ SOLN
INTRAMUSCULAR | Status: AC
  Filled 2015-08-08: qty 30

## 2015-08-08 MED ORDER — HEPARIN SODIUM (PORCINE) 1000 UNIT/ML IJ SOLN
INTRAMUSCULAR | Status: AC
  Filled 2015-08-08: qty 1

## 2015-08-08 MED ORDER — FENTANYL CITRATE (PF) 100 MCG/2ML IJ SOLN
INTRAMUSCULAR | Status: AC
  Filled 2015-08-08: qty 2

## 2015-08-08 MED ORDER — IOPAMIDOL (ISOVUE-370) INJECTION 76%
INTRAVENOUS | Status: DC | PRN
  Administered 2015-08-08: 55 mL

## 2015-08-08 MED ORDER — SODIUM CHLORIDE 0.9% FLUSH: 3.0000 mL | INTRAVENOUS | Status: DC | PRN

## 2015-08-08 MED ORDER — MIDAZOLAM HCL 2 MG/2ML IJ SOLN
INTRAMUSCULAR | Status: AC
  Filled 2015-08-08: qty 2

## 2015-08-08 MED ORDER — VERAPAMIL HCL 2.5 MG/ML IV SOLN
INTRAVENOUS | Status: DC | PRN
  Administered 2015-08-08: 10 mL via INTRA_ARTERIAL

## 2015-08-08 MED ORDER — HEPARIN (PORCINE) IN NACL 2-0.9 UNIT/ML-% IJ SOLN
INTRAMUSCULAR | Status: AC
  Filled 2015-08-08: qty 1000

## 2015-08-08 MED ORDER — FUROSEMIDE 40 MG PO TABS: 60.0000 mg | ORAL_TABLET | Freq: Every day | ORAL | Status: DC

## 2015-08-08 MED ORDER — HEPARIN (PORCINE) IN NACL 2-0.9 UNIT/ML-% IJ SOLN
INTRAMUSCULAR | Status: DC | PRN
  Administered 2015-08-08: 12:00:00

## 2015-08-08 MED ORDER — VERAPAMIL HCL 2.5 MG/ML IV SOLN
INTRAVENOUS | Status: AC
  Filled 2015-08-08: qty 2

## 2015-08-08 SURGICAL SUPPLY — 14 items
CATH BALLN WEDGE 5F 110CM (CATHETERS) ×2 IMPLANT
CATH INFINITI 5 FR JL3.5 (CATHETERS) ×4 IMPLANT
CATH INFINITI JR4 5F (CATHETERS) ×2 IMPLANT
DEVICE RAD COMP TR BAND LRG (VASCULAR PRODUCTS) ×2 IMPLANT
GLIDESHEATH SLEND SS 6F .021 (SHEATH) ×2 IMPLANT
GUIDEWIRE .025 260CM (WIRE) ×2 IMPLANT
KIT HEART LEFT (KITS) ×2 IMPLANT
PACK CARDIAC CATHETERIZATION (CUSTOM PROCEDURE TRAY) ×2 IMPLANT
SHEATH FAST CATH BRACH 5F 5CM (SHEATH) ×2 IMPLANT
SYR MEDRAD MARK V 150ML (SYRINGE) IMPLANT
TRANSDUCER W/STOPCOCK (MISCELLANEOUS) ×2 IMPLANT
TUBING CIL FLEX 10 FLL-RA (TUBING) ×2 IMPLANT
WIRE HI TORQ VERSACORE-J 145CM (WIRE) ×2 IMPLANT
WIRE SAFE-T 1.5MM-J .035X260CM (WIRE) ×2 IMPLANT

## 2015-08-08 NOTE — Interval H&P Note (Signed)
Cath Lab Visit (complete for each Cath Lab visit)  Clinical Evaluation Leading to the Procedure:   ACS: Yes.    Non-ACS:    Anginal Classification: CCS IV  Anti-ischemic medical therapy: Minimal Therapy (1 class of medications)  Non-Invasive Test Results: No non-invasive testing performed  Prior CABG: No previous CABG      History and Physical Interval Note:  08/08/2015 11:18 AM  Merril D Kreger  has presented today for surgery, with the diagnosis of cp  The various methods of treatment have been discussed with the patient and family. After consideration of risks, benefits and other options for treatment, the patient has consented to  Procedure(s): Right/Left Heart Cath and Coronary Angiography (N/A) as a surgical intervention .  The patient's history has been reviewed, patient examined, no change in status, stable for surgery.  I have reviewed the patient's chart and labs.  Questions were answered to the patient's satisfaction.     Sherren Mocha

## 2015-08-08 NOTE — Progress Notes (Signed)
Utilization review completed.  

## 2015-08-08 NOTE — Progress Notes (Signed)
ANTICOAGULATION CONSULT NOTE - Follow Up Consult  Pharmacy Consult for Heparin Indication: chest pain/ACS  Allergies  Allergen Reactions  . Morphine And Related Nausea And Vomiting   Patient Measurements: Height: 5\' 8"  (172.7 cm) Weight: 213 lb 1.6 oz (96.662 kg) IBW/kg (Calculated) : 68.4 HEPARIN DW (KG): 90.2  Last recorded weight = 101Kg Vital Signs: Temp: 98.9 F (37.2 C) (05/26 0410) Temp Source: Oral (05/26 0410) BP: 126/64 mmHg (05/26 0410) Pulse Rate: 88 (05/25 2025)  Labs:  Recent Labs  08/07/15 0509 08/07/15 1015 08/07/15 1330 08/07/15 2054 08/08/15 0335  HGB 16.4  --   --   --  14.5  HCT 48.6  --   --   --  45.0  PLT 249  --   --   --  225  APTT  --   --  >200*  --   --   LABPROT  --   --  15.3*  --  14.6  INR  --   --  1.19  --  1.12  HEPARINUNFRC  --   --   --  <0.10* 0.23*  CREATININE 0.66  --   --   --   --   TROPONINI  --  0.04*  --   --   --     Estimated Creatinine Clearance: 105.2 mL/min (by C-G formula based on Cr of 0.66).  Medical History: Past Medical History  Diagnosis Date  . Essential hypertension   . Sleep apnea   . Type 2 diabetes mellitus ([place])   . Back pain   . MRSA (methicillin resistant staph aureus) culture positive   . Abnormal cardiovascular stress test 03/2013  . Diastolic dysfunction   . Coronary artery disease     Multivessel with significant LAD involvement by chest CT 2014  . Lumbar herniated disc     L4-L5  . CHF (congestive heart failure) ([place])   . Shortness of breath dyspnea    Medications:  Prescriptions prior to admission  Medication Sig Dispense Refill Last Dose  . aspirin EC 81 MG tablet Take 81 mg by mouth daily.    08/06/2015 at Unknown time  . atorvastatin (LIPITOR) 20 MG tablet Take 1 tablet by mouth daily.   08/06/2015 at Unknown time  . CALCIUM PO Take 1 tablet by mouth daily.   08/06/2015 at Unknown time  . Cyanocobalamin (VITAMIN B 12 PO) Take 1 tablet by mouth daily.   08/06/2015 at Unknown time  .  diphenhydrAMINE (BENADRYL) 25 MG tablet Take 100 mg by mouth at bedtime.   08/06/2015 at Unknown time  . Dulaglutide (TRULICITY) 1.5 0000000 SOPN Inject 0.5 mLs into the skin once a week.   Past Week at Unknown time  . Flaxseed, Linseed, (FLAXSEED OIL PO) Take 1 tablet by mouth daily.    08/06/2015 at Unknown time  . gabapentin (NEURONTIN) 300 MG capsule Take 1 capsule (300 mg total) by mouth at bedtime. 30 capsule 0 08/06/2015 at Unknown time  . HYDROcodone-acetaminophen (NORCO) 10-325 MG per tablet Take 1-2 tablets by mouth every 6 (six) hours as needed for severe pain.    unknown  . ibuprofen (ADVIL,MOTRIN) 200 MG tablet Take 800 mg by mouth daily as needed for moderate pain.   unknown  . MAGNESIUM PO Take 1 tablet by mouth daily.   08/06/2015 at Unknown time  . metFORMIN (GLUCOPHAGE) 500 MG tablet Take 500 mg by mouth 2 (two) times daily.    08/06/2015 at Unknown time  . Multiple  Vitamin (MULTIVITAMIN WITH MINERALS) TABS Take 1 tablet by mouth daily.   08/06/2015 at Unknown time  . nitroGLYCERIN (NITROSTAT) 0.4 MG SL tablet Place 1 tablet (0.4 mg total) under the tongue every 5 (five) minutes as needed for chest pain. 25 tablet 4 unknown  . POTASSIUM PO Take 1 tablet by mouth daily.   08/06/2015 at Unknown time  . testosterone cypionate (DEPOTESTOTERONE CYPIONATE) 100 MG/ML injection Inject 100 mg into the muscle every 28 (twenty-eight) days. For IM use only   Past Month at Unknown time  . traMADol (ULTRAM) 50 MG tablet Take 50 mg by mouth 3 (three) times daily as needed for moderate pain.   08/06/2015 at Unknown time  . omeprazole (PRILOSEC) 40 MG capsule Take 1 capsule (40 mg total) by mouth daily. (Patient not taking: Reported on 08/07/2015) 30 capsule 3 Completed Course at Unknown time    Assessment: 65yo male with known h/o CAD.  Pt presented to ED with c/o CP.  Currently on IV Heparin for ACS at 1100 units/hr. HL is undetectable. RN reports no issues with infusion   Repeat HL remains  subtherapeutic at 0.23 on heparin 1350 units/hr. Nurse reports no issues with infusion or bleeding.  Goal of Therapy:  Heparin level 0.3-0.7 units/ml Monitor platelets by anticoagulation protocol: Yes   Plan:  Increase heparin to 1550 units/hr 6h HL Daily HL/CBC  Monitor s/sx of bleeding  Andrey Cota. Diona Foley, PharmD, [place] Clinical Pharmacist Pager [phone]

## 2015-08-08 NOTE — H&P (View-Only) (Signed)
Patient Name: Jonathan Gilmore Date of Encounter: 08/08/2015     Active Problems:   DM type 2 (diabetes mellitus, type 2) (HCC)   HTN (hypertension)   Hyperlipidemia   Diastolic CHF (HCC)   OSA (obstructive sleep apnea)   Acute pulmonary edema (HCC)   Acute diastolic CHF (congestive heart failure) ([place])    SUBJECTIVE  Feeling much better. Awaiting heart cath.   CURRENT MEDS . aspirin EC  81 mg Oral Daily  . atorvastatin  80 mg Oral q1800  . diphenhydrAMINE  100 mg Oral QHS  . furosemide  60 mg Intravenous BID  . gabapentin  300 mg Oral QHS  . insulin aspart  0-15 Units Subcutaneous TID WC  . lisinopril  2.5 mg Oral Daily  . magnesium oxide   Oral Daily  . metoprolol tartrate  12.5 mg Oral BID  . modafinil  200 mg Oral Daily  . multivitamin with minerals  1 tablet Oral Daily  . nicotine  21 mg Transdermal Once  . pantoprazole  40 mg Oral Daily  . potassium chloride  40 mEq Oral Daily  . sodium chloride flush  3 mL Intravenous Q12H  . sodium chloride flush  3 mL Intravenous Q12H    OBJECTIVE  Filed Vitals:   08/07/15 1600 08/07/15 2025 08/08/15 0327 08/08/15 0410  BP:  107/50  126/64  Pulse:  88    Temp:  98.7 F (37.1 C)  98.9 F (37.2 C)  TempSrc:  Oral  Oral  Resp:  18  18  Height: 5\' 8"  (1.727 m)     Weight: 223 lb 4.8 oz (101.288 kg)  213 lb 1.6 oz (96.662 kg)   SpO2:  95%  96%    Intake/Output Summary (Last 24 hours) at 08/08/15 0844 Last data filed at 08/08/15 0307  Gross per 24 hour  Intake    360 ml  Output   2225 ml  Net  -1865 ml   Filed Weights   08/07/15 1600 08/08/15 0327  Weight: 223 lb 4.8 oz (101.288 kg) 213 lb 1.6 oz (96.662 kg)    PHYSICAL EXAM  General: Pleasant, NAD. Neuro: Alert and oriented X 3. Moves all extremities spontaneously. Psych: Normal affect. HEENT:  Normal  Neck: Supple without bruits or JVD. Lungs:  Resp regular and unlabored, still with crackles at bases. Heart: RRR no s3, s4, or murmurs. Abdomen: Soft,  non-tender, non-distended, BS + x 4.  Extremities: No clubbing, cyanosis or edema. DP/PT/Radials 2+ and equal bilaterally.  Accessory Clinical Findings  CBC  Recent Labs  08/07/15 0509 08/08/15 0335  WBC 20.5* 11.4*  HGB 16.4 14.5  HCT 48.6 45.0  MCV 90.7 90.0  PLT 249 [id number]   Basic Metabolic Panel  Recent Labs  08/07/15 0509 08/08/15 0335  NA 135 139  K 3.8 4.0  CL 97* 102  CO2 30 31  GLUCOSE 197* 159*  BUN 14 13  CREATININE 0.66 0.78  CALCIUM 8.8* 8.8*   Liver Function Tests  Recent Labs  08/08/15 0335  AST 17  ALT 16*  ALKPHOS 74  BILITOT 0.6  PROT 6.5  ALBUMIN 3.7   No results for input(s): LIPASE, AMYLASE in the last 72 hours. Cardiac Enzymes  Recent Labs  08/07/15 1015  TROPONINI 0.04*   BNP Invalid input(s): POCBNP D-Dimer No results for input(s): DDIMER in the last 72 hours. Hemoglobin A1C  Recent Labs  08/07/15 0509  HGBA1C 8.4*   Fasting Lipid Panel  Recent Labs  08/07/15 0509  CHOL 135  HDL 38*  LDLCALC 58  TRIG 194*  CHOLHDL 3.6   Thyroid Function Tests No results for input(s): TSH, T4TOTAL, T3FREE, THYROIDAB in the last 72 hours.  Invalid input(s): FREET3  TELE  NSR with ST depression   Radiology/Studies  Dg Chest Port 1 View  08/07/2015  CLINICAL DATA:  Midsternal chest pain. Shortness of breath. Symptoms for 2 hours. EXAM: PORTABLE CHEST 1 VIEW COMPARISON:  09/19/2013 FINDINGS: Cardiomediastinal contours are unchanged, mild cardiomegaly. Prominent bilateral interstitial opacities consistent pulmonary edema. No large pleural effusion. No confluent airspace disease. Probable bibasilar atelectasis. No pneumothorax. IMPRESSION: Moderate CHF. Electronically Signed   By: Jeb Levering M.D.   On: 08/07/2015 05:29    ASSESSMENT AND PLAN  [name] is a 65 y.o. male with a history of OSA, ongoing tobacco abuse, CAD, HLD, DMT2, and HTN who was transferred from [facility] to [facility] on 08/07/15 for unstable angina and acute  pulmonary edema.   Unstable angina/CAD: known to have multivessel CAD most significant in the LAD distribution based on previous chest CT imaging from 2014. He did have an interval Myoview in 2015 demonstrating a component of anterior scar and diaphragmatic attenuation, LVEF 37% (had been normal by echocardiogram previously). Last seen by Dr. Harl Bowie in December 2016 and he was managed medically. Due to sx concerning for [place] and ST depression on ECG he was placed on heparin gtt. First troponin 0.04. No further trend. Will add serial troponin. Plan is for coronary angiography today with Dr. Burt Knack.   Acute pulmonary edema: improved after IV lasix and NTG. Continue IV Lasix 60mg  BID. Net neg 1.9L and weight down 10lbs. 2D ECHO pending, previously normal EF on echo in 2014. Patient's breathing improved to baseline. Still with some crackles on lung exam and JVD. Will add right heart cath to get an idea of volume status, but likely needs continued diuresis.   Diabetes: last HgA1c 8.4. Metformin held for catheterization. Continue SSI  Hypertension: was quite hypertensive on admission (BP 207/107). This improved with IV lasix and IV NTG as well as addition of ACEI. Continue lisinopril 2.5mg  daily and lopressor 12.5 mg BID. BP now under good control.  HLD: LDL at goal @ 58. Continue statin.   Ongoing tobacco abuse: Smoking cessation is strongly recommended.  Signed, Angelena Form PA-C  Pager [phone]

## 2015-08-08 NOTE — Care Management Note (Signed)
Case Management Note Marvetta Gibbons RN, BSN Unit 2W-Case Manager [phone]  Patient Details  Name: Jonathan Gilmore MRN: [id number] Date of Birth: [birth year]  Subjective/Objective:     Pt admitted with HF               Action/Plan: PTA pt lived at home - anticipate return home- CM to follow for d/c needs  Expected Discharge Date:                  Expected Discharge Plan:  Home/Self Care  In-House Referral:     Discharge planning Services  CM Consult  Post Acute Care Choice:    Choice offered to:     DME Arranged:    DME Agency:     HH Arranged:    HH Agency:     Status of Service:  In process, will continue to follow  Medicare Important Message Given:    Date Medicare IM Given:    Medicare IM give by:    Date Additional Medicare IM Given:    Additional Medicare Important Message give by:     If discussed at [place] of Stay Meetings, dates discussed:    Additional Comments:  Dawayne Patricia, RN 08/08/2015, 3:34 PM

## 2015-08-08 NOTE — Progress Notes (Signed)
*  PRELIMINARY RESULTS* Echocardiogram 2D Echocardiogram has been performed.  Jonathan Gilmore 08/08/2015, 9:49 AM

## 2015-08-08 NOTE — Progress Notes (Signed)
Patient Name: Jonathan Gilmore Date of Encounter: 08/08/2015     Active Problems:   DM type 2 (diabetes mellitus, type 2) (HCC)   HTN (hypertension)   Hyperlipidemia   Diastolic CHF (HCC)   OSA (obstructive sleep apnea)   Acute pulmonary edema (HCC)   Acute diastolic CHF (congestive heart failure) ([place])    SUBJECTIVE  Feeling much better. Awaiting heart cath.   CURRENT MEDS . aspirin EC  81 mg Oral Daily  . atorvastatin  80 mg Oral q1800  . diphenhydrAMINE  100 mg Oral QHS  . furosemide  60 mg Intravenous BID  . gabapentin  300 mg Oral QHS  . insulin aspart  0-15 Units Subcutaneous TID WC  . lisinopril  2.5 mg Oral Daily  . magnesium oxide   Oral Daily  . metoprolol tartrate  12.5 mg Oral BID  . modafinil  200 mg Oral Daily  . multivitamin with minerals  1 tablet Oral Daily  . nicotine  21 mg Transdermal Once  . pantoprazole  40 mg Oral Daily  . potassium chloride  40 mEq Oral Daily  . sodium chloride flush  3 mL Intravenous Q12H  . sodium chloride flush  3 mL Intravenous Q12H    OBJECTIVE  Filed Vitals:   08/07/15 1600 08/07/15 2025 08/08/15 0327 08/08/15 0410  BP:  107/50  126/64  Pulse:  88    Temp:  98.7 F (37.1 C)  98.9 F (37.2 C)  TempSrc:  Oral  Oral  Resp:  18  18  Height: 5\' 8"  (1.727 m)     Weight: 223 lb 4.8 oz (101.288 kg)  213 lb 1.6 oz (96.662 kg)   SpO2:  95%  96%    Intake/Output Summary (Last 24 hours) at 08/08/15 0844 Last data filed at 08/08/15 0307  Gross per 24 hour  Intake    360 ml  Output   2225 ml  Net  -1865 ml   Filed Weights   08/07/15 1600 08/08/15 0327  Weight: 223 lb 4.8 oz (101.288 kg) 213 lb 1.6 oz (96.662 kg)    PHYSICAL EXAM  General: Pleasant, NAD. Neuro: Alert and oriented X 3. Moves all extremities spontaneously. Psych: Normal affect. HEENT:  Normal  Neck: Supple without bruits or JVD. Lungs:  Resp regular and unlabored, still with crackles at bases. Heart: RRR no s3, s4, or murmurs. Abdomen: Soft,  non-tender, non-distended, BS + x 4.  Extremities: No clubbing, cyanosis or edema. DP/PT/Radials 2+ and equal bilaterally.  Accessory Clinical Findings  CBC  Recent Labs  08/07/15 0509 08/08/15 0335  WBC 20.5* 11.4*  HGB 16.4 14.5  HCT 48.6 45.0  MCV 90.7 90.0  PLT 249 [id number]   Basic Metabolic Panel  Recent Labs  08/07/15 0509 08/08/15 0335  NA 135 139  K 3.8 4.0  CL 97* 102  CO2 30 31  GLUCOSE 197* 159*  BUN 14 13  CREATININE 0.66 0.78  CALCIUM 8.8* 8.8*   Liver Function Tests  Recent Labs  08/08/15 0335  AST 17  ALT 16*  ALKPHOS 74  BILITOT 0.6  PROT 6.5  ALBUMIN 3.7   No results for input(s): LIPASE, AMYLASE in the last 72 hours. Cardiac Enzymes  Recent Labs  08/07/15 1015  TROPONINI 0.04*   BNP Invalid input(s): POCBNP D-Dimer No results for input(s): DDIMER in the last 72 hours. Hemoglobin A1C  Recent Labs  08/07/15 0509  HGBA1C 8.4*   Fasting Lipid Panel  Recent Labs  08/07/15 0509  CHOL 135  HDL 38*  LDLCALC 58  TRIG 194*  CHOLHDL 3.6   Thyroid Function Tests No results for input(s): TSH, T4TOTAL, T3FREE, THYROIDAB in the last 72 hours.  Invalid input(s): FREET3  TELE  NSR with ST depression   Radiology/Studies  Dg Chest Port 1 View  08/07/2015  CLINICAL DATA:  Midsternal chest pain. Shortness of breath. Symptoms for 2 hours. EXAM: PORTABLE CHEST 1 VIEW COMPARISON:  09/19/2013 FINDINGS: Cardiomediastinal contours are unchanged, mild cardiomegaly. Prominent bilateral interstitial opacities consistent pulmonary edema. No large pleural effusion. No confluent airspace disease. Probable bibasilar atelectasis. No pneumothorax. IMPRESSION: Moderate CHF. Electronically Signed   By: Jonathan Gilmore M.D.   On: 08/07/2015 05:29    ASSESSMENT AND PLAN  Jonathan Gilmore is a 64 y.o. male with a history of OSA, ongoing tobacco abuse, CAD, HLD, DMT2, and HTN who was transferred from [facility] to [facility] on 08/07/15 for unstable angina and acute  pulmonary edema.   Unstable angina/CAD: known to have multivessel CAD most significant in the LAD distribution based on previous chest CT imaging from 2014. He did have an interval Myoview in 2015 demonstrating a component of anterior scar and diaphragmatic attenuation, LVEF 37% (had been normal by echocardiogram previously). Last seen by Dr. Harl Gilmore in December 2016 and he was managed medically. Due to sx concerning for [place] and ST depression on ECG he was placed on heparin gtt. First troponin 0.04. No further trend. Will add serial troponin. Plan is for coronary angiography today with Dr. Burt Gilmore.   Acute pulmonary edema: improved after IV lasix and NTG. Continue IV Lasix 60mg  BID. Net neg 1.9L and weight down 10lbs. 2D ECHO pending, previously normal EF on echo in 2014. Patient's breathing improved to baseline. Still with some crackles on lung exam and JVD. Will add right heart cath to get an idea of volume status, but likely needs continued diuresis.   Diabetes: last HgA1c 8.4. Metformin held for catheterization. Continue SSI  Hypertension: was quite hypertensive on admission (BP 207/107). This improved with IV lasix and IV NTG as well as addition of ACEI. Continue lisinopril 2.5mg  daily and lopressor 12.5 mg BID. BP now under good control.  HLD: LDL at goal @ 58. Continue statin.   Ongoing tobacco abuse: Smoking cessation is strongly recommended.  Signed, Jonathan Form PA-C  Pager [phone]

## 2015-08-08 NOTE — Progress Notes (Signed)
RT NOTE:  Pt is refusing to wear CPAP tonight. He wore last night and decided to not wear tonight. CPAP is no longer in room.

## 2015-08-09 DIAGNOSIS — I350 Nonrheumatic aortic (valve) stenosis: Secondary | ICD-10-CM

## 2015-08-09 DIAGNOSIS — F17200 Nicotine dependence, unspecified, uncomplicated: Secondary | ICD-10-CM

## 2015-08-09 DIAGNOSIS — I2510 Atherosclerotic heart disease of native coronary artery without angina pectoris: Secondary | ICD-10-CM | POA: Diagnosis present

## 2015-08-09 LAB — GLUCOSE, CAPILLARY: GLUCOSE-CAPILLARY: 184 mg/dL — AB (ref 65–99)

## 2015-08-09 MED ORDER — METOPROLOL TARTRATE 25 MG PO TABS: 12.5000 mg | ORAL_TABLET | Freq: Two times a day (BID) | ORAL | Status: DC

## 2015-08-09 MED ORDER — METFORMIN HCL 500 MG PO TABS: 500.0000 mg | ORAL_TABLET | Freq: Two times a day (BID) | ORAL | Status: DC

## 2015-08-09 MED ORDER — FUROSEMIDE 20 MG PO TABS: 60.0000 mg | ORAL_TABLET | Freq: Every day | ORAL | Status: DC

## 2015-08-09 MED ORDER — ATORVASTATIN CALCIUM 80 MG PO TABS: 80.0000 mg | ORAL_TABLET | Freq: Every day | ORAL | Status: DC

## 2015-08-09 NOTE — Discharge Instructions (Signed)
Heart Failure  Heart failure means your heart has trouble pumping blood. This makes it hard for your body to work well. Heart failure is usually a long-term (chronic) condition. You must take good care of yourself and follow your doctor's treatment plan.  HOME CARE   Take your heart medicine as told by your doctor.    Do not stop taking medicine unless your doctor tells you to.    Do not skip any dose of medicine.    Refill your medicines before they run out.    Take other medicines only as told by your doctor or pharmacist.   Stay active if told by your doctor. The elderly and people with severe heart failure should talk with a doctor about physical activity.   Eat heart-healthy foods. Choose foods that are without trans fat and are low in saturated fat, cholesterol, and salt (sodium). This includes fresh or frozen fruits and vegetables, fish, lean meats, fat-free or low-fat dairy foods, whole grains, and high-fiber foods. Lentils and dried peas and beans (legumes) are also good choices.   Limit salt if told by your doctor.   Cook in a healthy way. Roast, grill, broil, bake, poach, steam, or stir-fry foods.   Limit fluids as told by your doctor.   Weigh yourself every morning. Do this after you pee (urinate) and before you eat breakfast. Write down your weight to give to your doctor.   Take your blood pressure and write it down if your doctor tells you to.   Ask your doctor how to check your pulse. Check your pulse as told.   Lose weight if told by your doctor.   Stop smoking or chewing tobacco. Do not use gum or patches that help you quit without your doctor's approval.   Schedule and go to doctor visits as told.   Nonpregnant women should have no more than 1 drink a day. Men should have no more than 2 drinks a day. Talk to your doctor about drinking alcohol.   Stop illegal drug use.   Stay current with shots (immunizations).   Manage your health conditions as told by your doctor.   Learn to  manage your stress.   Rest when you are tired.   If it is really hot outside:    Avoid intense activities.    Use air conditioning or fans, or get in a cooler place.    Avoid caffeine and alcohol.    Wear loose-fitting, lightweight, and light-colored clothing.   If it is really cold outside:    Avoid intense activities.    Layer your clothing.    Wear mittens or gloves, a hat, and a scarf when going outside.    Avoid alcohol.   Learn about heart failure and get support as needed.   Get help to maintain or improve your quality of life and your ability to care for yourself as needed.  GET HELP IF:    You gain weight quickly.   You are more short of breath than usual.   You cannot do your normal activities.   You tire easily.   You cough more than normal, especially with activity.   You have any or more puffiness (swelling) in areas such as your hands, feet, ankles, or belly (abdomen).   You cannot sleep because it is hard to breathe.   You feel like your heart is beating fast (palpitations).   You get dizzy or light-headed when you stand up.  GET HELP   RIGHT AWAY IF:    You have trouble breathing.   There is a change in mental status, such as becoming less alert or not being able to focus.   You have chest pain or discomfort.   You faint.  MAKE SURE YOU:    Understand these instructions.   Will watch your condition.   Will get help right away if you are not doing well or get worse.     This information is not intended to replace advice given to you by your health care provider. Make sure you discuss any questions you have with your health care provider.     Document Released: 12/09/2007 Document Revised: 03/22/2014 Document Reviewed: 04/17/2012  [employer]vier Interactive Patient Education 2016 [employer]

## 2015-08-09 NOTE — Progress Notes (Signed)
Subjective:  No SOB. He declined C-pap. He says he only uses C-pap when he feels he needs it.   Objective:  Vital Signs in the last 24 hours: Temp:  [98.2 F (36.8 C)-98.8 F (37.1 C)] 98.2 F (36.8 C) (05/27 0425) Pulse Rate:  [71-83] 74 (05/27 0425) Resp:  [12-18] 18 (05/27 0425) BP: (114-151)/(60-93) 139/69 mmHg (05/27 0425) SpO2:  [96 %-100 %] 99 % (05/27 0425) Weight:  [210 lb 12.2 oz (95.6 kg)] 210 lb 12.2 oz (95.6 kg) (05/27 0425)  Intake/Output from previous day: No intake or output data in the 24 hours ending 08/09/15 0806  Physical Exam: General appearance: alert, cooperative, no distress and mildly obese Neck: no JVD Lungs: clear to auscultation bilaterally Heart: regular rate and rhythm and 2/6 systolic murmur AOV Extremities: no LE edema, Rt radial site without hematoma Neurologic: Grossly normal   Rate: 72  Rhythm: normal sinus rhythm  Lab Results:  Recent Labs  08/07/15 0509 08/08/15 0335  WBC 20.5* 11.4*  HGB 16.4 14.5  PLT 249 225    Recent Labs  08/07/15 0509 08/08/15 0335  NA 135 139  K 3.8 4.0  CL 97* 102  CO2 30 31  GLUCOSE 197* 159*  BUN 14 13  CREATININE 0.66 0.78    Recent Labs  08/07/15 1015 08/08/15 0938  TROPONINI 0.04* 0.03    Recent Labs  08/08/15 0335  INR 1.12    Scheduled Meds: . aspirin EC  81 mg Oral Daily  . atorvastatin  80 mg Oral q1800  . diphenhydrAMINE  100 mg Oral QHS  . furosemide  60 mg Oral Daily  . gabapentin  300 mg Oral QHS  . insulin aspart  0-15 Units Subcutaneous TID WC  . lisinopril  2.5 mg Oral Daily  . magnesium oxide   Oral Daily  . metoprolol tartrate  12.5 mg Oral BID  . modafinil  200 mg Oral Daily  . multivitamin with minerals  1 tablet Oral Daily  . pantoprazole  40 mg Oral Daily  . potassium chloride  40 mEq Oral Daily  . sodium chloride flush  3 mL Intravenous Q12H  . sodium chloride flush  3 mL Intravenous Q12H  . sodium chloride flush  3 mL Intravenous Q12H    Continuous Infusions: . nitroGLYCERIN Stopped (08/07/15 0625)   PRN Meds:.sodium chloride, sodium chloride, acetaminophen **OR** acetaminophen, HYDROcodone-acetaminophen, nitroGLYCERIN, ondansetron (ZOFRAN) IV, sodium chloride flush, sodium chloride flush   Imaging: Imaging results have been reviewed  Cardiac Studies: Echo 08/08/15 Study Conclusions  - Left ventricle: The cavity size was normal. Wall thickness was  increased in a pattern of mild LVH. Systolic function was mildly  reduced. The estimated ejection fraction was in the range of 45%  to 50%. Diffuse hypokinesis. Doppler parameters are consistent  with restrictive physiology, indicative of decreased left  ventricular diastolic compliance and/or increased left atrial  pressure. Doppler parameters are consistent with high ventricular  filling pressure. - Aortic valve: Valve mobility was restricted. There was mild to  moderate stenosis. Valve area (VTI): 0.96 cm^2. Valve area  (Vmax): 0.91 cm^2. Valve area (Vmean): 0.89 cm^2. - Mitral valve: Calcified annulus. There was mild regurgitation. - Left atrium: The atrium was moderately dilated. - Right atrium: The atrium was mildly dilated. - Atrial septum: There was a patent foramen ovale.  Impressions:  - Mild global reduction in LV function; restrictive filling with  elevated filling pressure; mild LVH; biatrial enlargement;  heavily calcified aortic valve with  probable mild to moderate AS  (mean gradient 16 mmHg; calculated AVA .9 cm2 likely  overestimates severity; dimensionless index .3 not supportive of  severe AS); mild MR; patent foramen ovale.  Assessment/Plan:  65 y.o. male with medical history significant of diastolic chf, htn, OSA, DM, andmultivessel CAD by CT in 2014, who presented to [facility] ED  08/07/15 with SOB. The pt also has a history of an abnormal stress test in 2015 with findings concerning for a small anteroseptal infarct. No further  cardiac w/u done then as pt was asymptomatic. On arrival to the emergency room was found to be hypertensive with a blood pressure 207/107, heart rate 126, O2 sat 74%. Initial troponin 0.04. BNP 196.0. Chest x-ray revealed moderate CHF. EKG revealed normal sinus rhythm with T-wave inversion in inferior lateral leads which was new compared to prior EKG in December 2016.        The pt was admitted and diuresed, 13 lbs total. Troponin peaked at 0.04. The pt was transferred to [facility] for cath which revealed 50% LAD. Echo showed moderate AS, diastolic dysfunction, EF Q000111Q.    Principal Problem:   Acute diastolic CHF (congestive heart failure) (HCC) Active Problems:   CAD -50% LAD    DM type 2 (diabetes mellitus, type 2) (HCC)   HTN (hypertension)   Diastolic CHF (HCC)   Moderate aortic stenosis   Hyperlipidemia   Tobacco dependence   OSA (obstructive sleep apnea) -non compliant   PLAN:  Pt should be able to go home today, MD to see. F/U with Dr Harl Bowie.   [employer] PA-C 08/09/2015, 8:06 AM [phone]

## 2015-08-09 NOTE — Progress Notes (Signed)
Discharged to home with family office visits in place teaching done  

## 2015-08-09 NOTE — Discharge Summary (Signed)
Patient ID: Jonathan Gilmore,  MRN: [id number], DOB/AGE: [birth year] 65 y.o.  Admit date: 08/07/2015 Discharge date: 08/09/2015  Primary Care Provider: Octavio Graves, DO Primary Cardiologist: Dr Harl Bowie  Discharge Diagnoses Principal Problem:   Acute diastolic CHF (congestive heart failure) ([place]) Active Problems:   CAD -50% LAD    DM type 2 (diabetes mellitus, type 2) (HCC)   HTN (hypertension)   Diastolic CHF (HCC)   Moderate aortic stenosis   Hyperlipidemia   Tobacco dependence   OSA (obstructive sleep apnea) -non compliant    Procedures:  Echo 08/08/15                         Coronary angiogram 08/08/15   Hospital Course:  65 y.o. male with medical history significant of diastolic chf, htn, OSA, DM, andmultivessel CAD by CT in 2014, who presented to [facility] ED 08/07/15 with SOB. The pt also has a history of an abnormal stress test in 2015 with findings concerning for a small anteroseptal infarct. No further cardiac w/u done then as pt was asymptomatic. On arrival to the emergency room was found to be hypertensive with a blood pressure 207/107, heart rate 126, O2 sat 74%. Initial troponin 0.04. BNP 196.0. Chest x-ray revealed moderate CHF. EKG revealed normal sinus rhythm with T-wave inversion in inferior lateral leads which was new compared to prior EKG in December 2016.   The pt was admitted and diuresed, 13 lbs total. Troponin peaked at 0.04. The pt was transferred to [facility] for cath which revealed 50% LAD. Echo showed moderate AS, diastolic dysfunction, EF Q000111Q. The plan is for aggressive medical Rx. We will arrange for f/u in [place].   Discharge Vitals:  Blood pressure 139/69, pulse 74, temperature 98.2 F (36.8 C), temperature source Oral, resp. rate 18, height 5\' 8"  (1.727 m), weight 210 lb 12.2 oz (95.6 kg), SpO2 99 %.    Labs: Results for orders placed or performed during the hospital encounter of 08/07/15 (from the past 24 hour(s))  Heparin level (unfractionated)      Status: None   Collection Time: 08/08/15  9:38 AM  Result Value Ref Range   Heparin Unfractionated 0.36 0.30 - 0.70 IU/mL  Troponin I (q 6hr x 3)     Status: None   Collection Time: 08/08/15  9:38 AM  Result Value Ref Range   Troponin I 0.03 <0.031 ng/mL  I-STAT 3, venous blood gas (G3P V)     Status: Abnormal   Collection Time: 08/08/15 12:10 PM  Result Value Ref Range   pH, Ven 7.411 (H) 7.250 - 7.300   pCO2, Ven 46.8 45.0 - 50.0 mmHg   pO2, Ven 35.0 31.0 - 45.0 mmHg   Bicarbonate 29.7 (H) 20.0 - 24.0 mEq/L   TCO2 31 0 - 100 mmol/L   O2 Saturation 67.0 %   Acid-Base Excess 4.0 (H) 0.0 - 2.0 mmol/L   Patient temperature HIDE    Sample type VENOUS    Comment NOTIFIED PHYSICIAN   I-STAT 3, arterial blood gas (G3+)     Status: Abnormal   Collection Time: 08/08/15 12:12 PM  Result Value Ref Range   pH, Arterial 7.417 7.350 - 7.450   pCO2 arterial 45.0 35.0 - 45.0 mmHg   pO2, Arterial 75.0 (L) 80.0 - 100.0 mmHg   Bicarbonate 29.0 (H) 20.0 - 24.0 mEq/L   TCO2 30 0 - 100 mmol/L   O2 Saturation 95.0 %   Acid-Base Excess  4.0 (H) 0.0 - 2.0 mmol/L   Patient temperature HIDE    Sample type ARTERIAL   POCT Activated clotting time     Status: None   Collection Time: 08/08/15 12:24 PM  Result Value Ref Range   Activated Clotting Time 229 seconds  Glucose, capillary     Status: Abnormal   Collection Time: 08/08/15  4:55 PM  Result Value Ref Range   Glucose-Capillary 238 (H) 65 - 99 mg/dL  Glucose, capillary     Status: Abnormal   Collection Time: 08/08/15 11:03 PM  Result Value Ref Range   Glucose-Capillary 360 (H) 65 - 99 mg/dL   Comment 1 Notify RN    Comment 2 Document in Chart   Glucose, capillary     Status: Abnormal   Collection Time: 08/09/15  6:30 AM  Result Value Ref Range   Glucose-Capillary 184 (H) 65 - 99 mg/dL    Disposition:      Follow-up Information    Follow up with Carlyle Dolly, MD.   Specialty:  Cardiology   Why:  office will contact you    Contact information:   [address] [place] [address] [phone]       Discharge Medications:    Medication List    STOP taking these medications        omeprazole 40 MG capsule  Commonly known as:  PRILOSEC      TAKE these medications        aspirin EC 81 MG tablet  Take 81 mg by mouth daily.     atorvastatin 80 MG tablet  Commonly known as:  LIPITOR  Take 1 tablet (80 mg total) by mouth daily at 6 PM.     CALCIUM PO  Take 1 tablet by mouth daily.     diphenhydrAMINE 25 MG tablet  Commonly known as:  BENADRYL  Take 100 mg by mouth at bedtime.     FLAXSEED OIL PO  Take 1 tablet by mouth daily.     furosemide 20 MG tablet  Commonly known as:  LASIX  Take 3 tablets (60 mg total) by mouth daily.     gabapentin 300 MG capsule  Commonly known as:  NEURONTIN  Take 1 capsule (300 mg total) by mouth at bedtime.     HYDROcodone-acetaminophen 10-325 MG tablet  Commonly known as:  NORCO  Take 1-2 tablets by mouth every 6 (six) hours as needed for severe pain.     ibuprofen 200 MG tablet  Commonly known as:  ADVIL,MOTRIN  Take 800 mg by mouth daily as needed for moderate pain.     MAGNESIUM PO  Take 1 tablet by mouth daily.     metFORMIN 500 MG tablet  Commonly known as:  GLUCOPHAGE  Take 1 tablet (500 mg total) by mouth 2 (two) times daily.  Start taking on:  08/11/2015     metoprolol tartrate 25 MG tablet  Commonly known as:  LOPRESSOR  Take 0.5 tablets (12.5 mg total) by mouth 2 (two) times daily.     multivitamin with minerals Tabs tablet  Take 1 tablet by mouth daily.     nitroGLYCERIN 0.4 MG SL tablet  Commonly known as:  NITROSTAT  Place 1 tablet (0.4 mg total) under the tongue every 5 (five) minutes as needed for chest pain.     POTASSIUM PO  Take 1 tablet by mouth daily.     testosterone cypionate 100 MG/ML injection  Commonly known as:  DEPOTESTOTERONE CYPIONATE  Inject 100 mg into the muscle every 28 (twenty-eight) days. For IM use  only     traMADol 50 MG tablet  Commonly known as:  ULTRAM  Take 50 mg by mouth 3 (three) times daily as needed for moderate pain.     TRULICITY 1.5 0000000 Sopn  Generic drug:  Dulaglutide  Inject 0.5 mLs into the skin once a week.     VITAMIN B 12 PO  Take 1 tablet by mouth daily.         Duration of Discharge Encounter: Greater than 30 minutes including physician time.  Angelena Form PA-C 08/09/2015 9:24 AM

## 2015-08-13 ENCOUNTER — Encounter: Payer: Self-pay | Admitting: Cardiology

## 2015-08-13 ENCOUNTER — Ambulatory Visit (INDEPENDENT_AMBULATORY_CARE_PROVIDER_SITE_OTHER): Payer: PRIVATE HEALTH INSURANCE | Admitting: Cardiology

## 2015-08-13 VITALS — BP 138/76 | HR 72 | Ht 67.0 in | Wt 207.0 lb

## 2015-08-13 DIAGNOSIS — R0602 Shortness of breath: Secondary | ICD-10-CM | POA: Diagnosis not present

## 2015-08-13 DIAGNOSIS — I2510 Atherosclerotic heart disease of native coronary artery without angina pectoris: Secondary | ICD-10-CM

## 2015-08-13 DIAGNOSIS — G4730 Sleep apnea, unspecified: Secondary | ICD-10-CM | POA: Diagnosis not present

## 2015-08-13 DIAGNOSIS — I5032 Chronic diastolic (congestive) heart failure: Secondary | ICD-10-CM

## 2015-08-13 DIAGNOSIS — I10 Essential (primary) hypertension: Secondary | ICD-10-CM

## 2015-08-13 DIAGNOSIS — E785 Hyperlipidemia, unspecified: Secondary | ICD-10-CM

## 2015-08-13 DIAGNOSIS — Z79899 Other long term (current) drug therapy: Secondary | ICD-10-CM | POA: Diagnosis not present

## 2015-08-13 MED ORDER — LISINOPRIL 2.5 MG PO TABS: 2.5000 mg | ORAL_TABLET | Freq: Every day | ORAL | Status: DC

## 2015-08-13 MED ORDER — VARENICLINE TARTRATE 0.5 MG X 11 & 1 MG X 42 PO MISC: ORAL | Status: DC

## 2015-08-13 NOTE — Patient Instructions (Signed)
Medication Instructions:  Start lisinopril 2.5 mg daily Start chantix  0.5 mg 1 time daily for 3 days             0.5 mg 2 times daily for 4 days              1 mg 2 times daily for 11 weeks   Labwork: Your physician recommends that you return for lab work in: 2 weeks  bmet    Testing/Procedures: none  Follow-Up: Your physician recommends that you schedule a follow-up appointment in: 3 months with DR. BRANCH.    Any Other Special Instructions Will Be Listed Below (If Applicable).  I WILL REQUEST A COPY OF YOUR LABS FROM PCP   I GAVE YOU A WORK NOTE FOR 3 WEEKS.    If you need a refill on your cardiac medications before your next appointment, please call your pharmacy.

## 2015-08-13 NOTE — Progress Notes (Signed)
Patient ID: ALEXANDRA MOSQUEDA, male   DOB: [birth year], 65 y.o.   MRN: [id number]     Clinical Summary Mr. Mongan is a 65 y.o.male seen today for follow up of the following medical problems.   1. CAD - CT scan chest 02/2013 with 3 vessel CAD, severe LAD disease. - Jan 2015 nuclear stress without clear ischemia - admit 07/2015 with acute pulmonary edema in setting of severe HTN. Referred for cath.  - cath 07/2015 with 50% LAD disease, overall patent coronaries - his presentation with pulmonary edema likely secondary to severe HTN in setting of restrictive diastolic dysfunction. - echo 07/2015 LVEF 45-50%, restrictive diastolic dysfunction  - no recurrent SOB/DOE, no chest pain - compliant with meds  2. Severe OSA - not using CPAP machine due to discomfort. Stopped several years ago   3. Hyperlipidemia - 07/2015 TC 135 TG 194 HDL 38 LDL 58 - compliant with statin  4. Tobacco use - ongoing for 50 years. Working to quit. Patches did not help.  - tried e cigs without much benefit.   5. Aortic stenosis - echo 07/2015 mild to moderate. Mead gradient 16, AVA reported at 0.9 with dimensionless index 0.3  6. HTN - compliant with meds  7. Chronic diastolic HF - echo [id number] with restrictive diastolic dysfunction. Low normal to mildly decreased LVEF at 45-50%.  - home weights stable around 206 lbs since discharge    Past Medical History  Diagnosis Date  . Essential hypertension   . Sleep apnea   . Type 2 diabetes mellitus ([place])   . Back pain   . MRSA (methicillin resistant staph aureus) culture positive   . Abnormal cardiovascular stress test 03/2013  . Diastolic dysfunction   . Coronary artery disease     Multivessel with significant LAD involvement by chest CT 2014  . Lumbar herniated disc     L4-L5  . CHF (congestive heart failure) ([place])   . Shortness of breath dyspnea      Allergies  Allergen Reactions  . Morphine And Related Nausea And Vomiting     Current Outpatient  Prescriptions  Medication Sig Dispense Refill  . aspirin EC 81 MG tablet Take 81 mg by mouth daily.     Marland Kitchen atorvastatin (LIPITOR) 80 MG tablet Take 1 tablet (80 mg total) by mouth daily at 6 PM. 90 tablet 3  . CALCIUM PO Take 1 tablet by mouth daily.    . Cyanocobalamin (VITAMIN B 12 PO) Take 1 tablet by mouth daily.    . diphenhydrAMINE (BENADRYL) 25 MG tablet Take 100 mg by mouth at bedtime.    . Dulaglutide (TRULICITY) 1.5 0000000 SOPN Inject 0.5 mLs into the skin once a week.    . Flaxseed, Linseed, (FLAXSEED OIL PO) Take 1 tablet by mouth daily.     . furosemide (LASIX) 20 MG tablet Take 3 tablets (60 mg total) by mouth daily. 30 tablet 11  . gabapentin (NEURONTIN) 300 MG capsule Take 1 capsule (300 mg total) by mouth at bedtime. 30 capsule 0  . HYDROcodone-acetaminophen (NORCO) 10-325 MG per tablet Take 1-2 tablets by mouth every 6 (six) hours as needed for severe pain.     Marland Kitchen ibuprofen (ADVIL,MOTRIN) 200 MG tablet Take 800 mg by mouth daily as needed for moderate pain.    Marland Kitchen MAGNESIUM PO Take 1 tablet by mouth daily.    . metFORMIN (GLUCOPHAGE) 500 MG tablet Take 1 tablet (500 mg total) by mouth 2 (two) times daily.    Marland Kitchen  metoprolol tartrate (LOPRESSOR) 25 MG tablet Take 0.5 tablets (12.5 mg total) by mouth 2 (two) times daily. 90 tablet 3  . Multiple Vitamin (MULTIVITAMIN WITH MINERALS) TABS Take 1 tablet by mouth daily.    . nitroGLYCERIN (NITROSTAT) 0.4 MG SL tablet Place 1 tablet (0.4 mg total) under the tongue every 5 (five) minutes as needed for chest pain. 25 tablet 4  . POTASSIUM PO Take 1 tablet by mouth daily.    Marland Kitchen testosterone cypionate (DEPOTESTOTERONE CYPIONATE) 100 MG/ML injection Inject 100 mg into the muscle every 28 (twenty-eight) days. For IM use only    . traMADol (ULTRAM) 50 MG tablet Take 50 mg by mouth 3 (three) times daily as needed for moderate pain.     No current facility-administered medications for this visit.     Past Surgical History  Procedure Laterality  Date  . Bone spur removed  1997    Left shoulder  . Hemorrhoid surgery    . Anterior cervical decomp/discectomy fusion  01/18/2011    Procedure: ANTERIOR CERVICAL DECOMPRESSION/DISCECTOMY FUSION 2 LEVELS;  Surgeon: Cooper Render Pool;  Location: [place];  Service: Neurosurgery;  Laterality: N/A;  anterior cervical discectomy and fusion with allograft and plating, cervical five-six, cervical six-seven  . Carpal tunnel release Right   . Esophagogastroduodenoscopy N/A 04/04/2015    Procedure: ESOPHAGOGASTRODUODENOSCOPY (EGD);  Surgeon: Rogene Houston, MD;  Location: [facility];  Service: Endoscopy;  Laterality: N/A;  1240  . Esophageal dilation N/A 04/04/2015    Procedure: ESOPHAGEAL DILATION;  Surgeon: Rogene Houston, MD;  Location: [facility];  Service: Endoscopy;  Laterality: N/A;  . Biopsy N/A 04/04/2015    Procedure: BIOPSY;  Surgeon: Rogene Houston, MD;  Location: [facility];  Service: Endoscopy;  Laterality: N/A;  . Cardiac catheterization N/A 08/08/2015    Procedure: Right/Left Heart Cath and Coronary Angiography;  Surgeon: Sherren Mocha, MD;  Location: [place] CV LAB;  Service: Cardiovascular;  Laterality: N/A;     Allergies  Allergen Reactions  . Morphine And Related Nausea And Vomiting      Family History  Problem Relation Age of Onset  . Lung cancer Mother   . Diabetes Father   . Diabetes Sister   . Cushing syndrome Sister   . Hepatitis B Father      Social History Mr. Szott reports that he has been smoking Cigarettes.  He has a 50 pack-year smoking history. He has quit using smokeless tobacco. Mr. Hardester reports that he does not drink alcohol.   Review of Systems CONSTITUTIONAL: No weight loss, fever, chills, weakness or fatigue.  HEENT: Eyes: No visual loss, blurred vision, double vision or yellow sclerae.No hearing loss, sneezing, congestion, runny nose or sore throat.  SKIN: No rash or itching.  CARDIOVASCULAR: per HPI RESPIRATORY: No shortness of  breath, cough or sputum.  GASTROINTESTINAL: No anorexia, nausea, vomiting or diarrhea. No abdominal pain or blood.  GENITOURINARY: No burning on urination, no polyuria NEUROLOGICAL: No headache, dizziness, syncope, paralysis, ataxia, numbness or tingling in the extremities. No change in bowel or bladder control.  MUSCULOSKELETAL: No muscle, back pain, joint pain or stiffness.  LYMPHATICS: No enlarged nodes. No history of splenectomy.  PSYCHIATRIC: No history of depression or anxiety.  ENDOCRINOLOGIC: No reports of sweating, cold or heat intolerance. No polyuria or polydipsia.  Marland Kitchen   Physical Examination Filed Vitals:   08/13/15 1328  BP: 138/76  Pulse: 72   Filed Vitals:   08/13/15 1328  Height: 5'  7" (1.702 m)  Weight: 207 lb (93.895 kg)    Gen: resting comfortably, no acute distress HEENT: no scleral icterus, pupils equal round and reactive, no palptable cervical adenopathy,  CV: RRR, 2/6 systolic murmur RUSB, no jvd Resp: Clear to auscultation bilaterally GI: abdomen is soft, non-tender, non-distended, normal bowel sounds, no hepatosplenomegaly MSK: extremities are warm, no edema.  Skin: warm, no rash Neuro:  no focal deficits Psych: appropriate affect   Diagnostic Studies Jan 2015 MPI IMPRESSION: 1. Abnormal exercise myocardial perfusion imaging stress test  2. Inferior wall defect likely due to subdiaphragmatic attenuation, its wall motion is similar to other areas without a perfusion defect.  3. Likely small anteroseptal infract, this area is more hypokinetic than the rest of the myocardium  3. Low left ventricular systolic function with global hypokinesis  4. Mildly reduced functional capacity (90% of age and gender predicted)  5. Overall increased risk for major cardiac events based on low ejection fraction. There is no current myocardium at jeopardy.   02/2013 echo Study Conclusions  - Left ventricle: The cavity size was normal. There was  mild concentric hypertrophy. Systolic function was normal. The estimated ejection fraction was in the range of 55% to 60%. Wall motion was normal; there were no regional wall motion abnormalities. There is mildly asynchronous contraction consistent with intraventricular conduction delay (IVCD) or bundle Verlee Pope block. Doppler parameters are consistent with abnormal left ventricular relaxation (grade 1 diastolic dysfunction). - Aortic valve: Cusp separation was mildly reduced. There was mild to moderate stenosis. - Left atrium: The atrium was moderately dilated.  07/2015 echo  Study Conclusions  - Left ventricle: The cavity size was normal. Wall thickness was  increased in a pattern of mild LVH. Systolic function was mildly  reduced. The estimated ejection fraction was in the range of 45%  to 50%. Diffuse hypokinesis. Doppler parameters are consistent  with restrictive physiology, indicative of decreased left  ventricular diastolic compliance and/or increased left atrial  pressure. Doppler parameters are consistent with high ventricular  filling pressure. - Aortic valve: Valve mobility was restricted. There was mild to  moderate stenosis. Valve area (VTI): 0.96 cm^2. Valve area  (Vmax): 0.91 cm^2. Valve area (Vmean): 0.89 cm^2. - Mitral valve: Calcified annulus. There was mild regurgitation. - Left atrium: The atrium was moderately dilated. - Right atrium: The atrium was mildly dilated. - Atrial septum: There was a patent foramen ovale.  Impressions:  - Mild global reduction in LV function; restrictive filling with  elevated filling pressure; mild LVH; biatrial enlargement;  heavily calcified aortic valve with probable mild to moderate AS  (mean gradient 16 mmHg; calculated AVA .9 cm2 likely  overestimates severity; dimensionless index .3 not supportive of  severe AS); mild MR; patent foramen ovale.  07/2015 Cath  Prox LAD to Mid LAD lesion,  50% stenosed.  1. Calcified moderate proximal-mid LAD stenosis 2. Widely patent, dominant LCx 3. Preserved cardiac output, mildly elevated right-sided intracardiac pressures  Recommend: Medical therapy for nonobstructive CAD and CHF    Assessment and Plan   1. CAD - moderate nonobstructive disease by recent cath. We will continue risk factor modificaiton  2. OSA - we will refer to Dr Luan Pulling for evaluation  3. Hyperlipidemia - LDL at goal, TGs elevated. Counseled on dietary changes to lower TGs. Will also improve with improved DM2 control  4. Tobacco abuse - given Rx for chantix  5. Aortic stenosis - mild to moderate by most recent echo, continue to monitor  6.  HTN - above goal given his history of HTN. Will start lisinopril 2.5mg  daily, check BMET in 2 weeks  7. Chronic diasotlic heart failure - appears euvolemic, conitnue current meds  8. SOB - we will refer to cardiac rehab   3 week work note provided given recent admission with pulmonary edema and some ongoing SOB.    F/u 3 months    Arnoldo Lenis, M.D.

## 2015-08-15 ENCOUNTER — Telehealth: Payer: Self-pay | Admitting: Cardiology

## 2015-08-15 MED ORDER — FUROSEMIDE 20 MG PO TABS: 60.0000 mg | ORAL_TABLET | Freq: Every day | ORAL | Status: DC

## 2015-08-15 NOTE — Telephone Encounter (Signed)
Wife needed refills of lasix for spouse

## 2015-08-15 NOTE — Telephone Encounter (Signed)
Patient's wife has questions regarding patient's medications from [place] yesterday / tg

## 2015-09-01 ENCOUNTER — Ambulatory Visit (INDEPENDENT_AMBULATORY_CARE_PROVIDER_SITE_OTHER): Payer: PRIVATE HEALTH INSURANCE | Admitting: Adult Health

## 2015-09-01 ENCOUNTER — Encounter: Payer: Self-pay | Admitting: Adult Health

## 2015-09-01 ENCOUNTER — Encounter: Payer: Self-pay | Admitting: *Deleted

## 2015-09-01 VITALS — BP 146/60 | HR 85 | Ht 67.0 in | Wt 215.0 lb

## 2015-09-01 DIAGNOSIS — I2510 Atherosclerotic heart disease of native coronary artery without angina pectoris: Secondary | ICD-10-CM

## 2015-09-01 DIAGNOSIS — Z720 Tobacco use: Secondary | ICD-10-CM | POA: Diagnosis not present

## 2015-09-01 DIAGNOSIS — Z79899 Other long term (current) drug therapy: Secondary | ICD-10-CM

## 2015-09-01 DIAGNOSIS — I10 Essential (primary) hypertension: Secondary | ICD-10-CM

## 2015-09-01 MED ORDER — METOPROLOL SUCCINATE ER 25 MG PO TB24: 25.0000 mg | ORAL_TABLET | Freq: Every day | ORAL | Status: DC

## 2015-09-01 MED ORDER — FUROSEMIDE 40 MG PO TABS: 40.0000 mg | ORAL_TABLET | Freq: Every day | ORAL | Status: DC

## 2015-09-01 NOTE — Patient Instructions (Addendum)
Your physician recommends that you schedule a follow-up appointment in:  3 Months with Dr. Harl Bowie.   Your physician has recommended you make the following change in your medication:  Start   Toprol XL 25 mg at night   Decrease Lasix to 40 mg Daily every morning  Your physician recommends that you have lab work done today.  You have been given a letter to return to work.   If you need a refill on your cardiac medications before your next appointment, please call your pharmacy.  Thank you for choosing [facility]!

## 2015-09-01 NOTE — Progress Notes (Signed)
Cardiology Office Note   Date:  09/01/2015   ID:  Jonathan Gilmore, DOB 1951-02-27, MRN [id number]  PCP:  Octavio Graves, DO  Cardiologist: Cloria Spring, NP   No chief complaint on file.     History of Present Illness: Jonathan Gilmore is a 65 y.o. male who presents for ongoing assessment and management of CAD, cardiac cath 07/2015 with 50% LAD stenosis, with overall patent coronaries, hypertension with restrictive diastolic dysfunction, chronic diastolic dysfunction, AoV stenosis mild to moderate, EF of 45%-50%, severe OSA, and hyperlipidemia. He was last seen by Dr. Harl Bowie on 08/13/2015, no changes were made to his regimen.   The patient is here today requesting a letter to return to work. He works in [employer] for [employer]. He denies any chest pain or dyspnea. He walks three quarters of a mile everyday. He complains of fatigue and insomnia. He denies excessive caffeine intake, he unfortunately continues to smoke.  Past Medical History  Diagnosis Date  . Essential hypertension   . Sleep apnea   . Type 2 diabetes mellitus ([place])   . Back pain   . MRSA (methicillin resistant staph aureus) culture positive   . Abnormal cardiovascular stress test 03/2013  . Diastolic dysfunction   . Coronary artery disease     Multivessel with significant LAD involvement by chest CT 2014  . Lumbar herniated disc     L4-L5  . CHF (congestive heart failure) ([place])   . Shortness of breath dyspnea     Past Surgical History  Procedure Laterality Date  . Bone spur removed  1997    Left shoulder  . Hemorrhoid surgery    . Anterior cervical decomp/discectomy fusion  01/18/2011    Procedure: ANTERIOR CERVICAL DECOMPRESSION/DISCECTOMY FUSION 2 LEVELS;  Surgeon: Cooper Render Pool;  Location: [facility];  Service: Neurosurgery;  Laterality: N/A;  anterior cervical discectomy and fusion with allograft and plating, cervical five-six, cervical six-seven  . Carpal tunnel release Right   .  Esophagogastroduodenoscopy N/A 04/04/2015    Procedure: ESOPHAGOGASTRODUODENOSCOPY (EGD);  Surgeon: Rogene Houston, MD;  Location: [facility];  Service: Endoscopy;  Laterality: N/A;  1240  . Esophageal dilation N/A 04/04/2015    Procedure: ESOPHAGEAL DILATION;  Surgeon: Rogene Houston, MD;  Location: [facility];  Service: Endoscopy;  Laterality: N/A;  . Biopsy N/A 04/04/2015    Procedure: BIOPSY;  Surgeon: Rogene Houston, MD;  Location: [facility];  Service: Endoscopy;  Laterality: N/A;  . Cardiac catheterization N/A 08/08/2015    Procedure: Right/Left Heart Cath and Coronary Angiography;  Surgeon: Sherren Mocha, MD;  Location: [facility];  Service: Cardiovascular;  Laterality: N/A;     Current Outpatient Prescriptions  Medication Sig Dispense Refill  . aspirin EC 81 MG tablet Take 81 mg by mouth daily.     Marland Kitchen atorvastatin (LIPITOR) 80 MG tablet Take 1 tablet (80 mg total) by mouth daily at 6 PM. 90 tablet 3  . CALCIUM PO Take 1 tablet by mouth daily.    . Cyanocobalamin (VITAMIN B 12 PO) Take 1 tablet by mouth daily.    . diphenhydrAMINE (BENADRYL) 25 MG tablet Take 100 mg by mouth at bedtime.    . Dulaglutide (TRULICITY) 1.5 0000000 SOPN Inject 0.5 mLs into the skin once a week.    . Flaxseed, Linseed, (FLAXSEED OIL PO) Take 1 tablet by mouth daily.     . furosemide (LASIX) 20 MG tablet Take 3 tablets (  60 mg total) by mouth daily. 270 tablet 3  . gabapentin (NEURONTIN) 300 MG capsule Take 1 capsule (300 mg total) by mouth at bedtime. 30 capsule 0  . HYDROcodone-acetaminophen (NORCO) 10-325 MG per tablet Take 1-2 tablets by mouth every 6 (six) hours as needed for severe pain.     Marland Kitchen ibuprofen (ADVIL,MOTRIN) 200 MG tablet Take 800 mg by mouth daily as needed for moderate pain.    Marland Kitchen lisinopril (PRINIVIL,ZESTRIL) 2.5 MG tablet Take 1 tablet (2.5 mg total) by mouth daily. 90 tablet 3  . MAGNESIUM PO Take 1 tablet by mouth daily.    . metFORMIN (GLUCOPHAGE) 500 MG tablet Take 1  tablet (500 mg total) by mouth 2 (two) times daily.    . metoprolol tartrate (LOPRESSOR) 25 MG tablet Take 0.5 tablets (12.5 mg total) by mouth 2 (two) times daily. 90 tablet 3  . Multiple Vitamin (MULTIVITAMIN WITH MINERALS) TABS Take 1 tablet by mouth daily.    . nitroGLYCERIN (NITROSTAT) 0.4 MG SL tablet Place 1 tablet (0.4 mg total) under the tongue every 5 (five) minutes as needed for chest pain. 25 tablet 4  . POTASSIUM PO Take 1 tablet by mouth daily.    Marland Kitchen testosterone cypionate (DEPOTESTOTERONE CYPIONATE) 100 MG/ML injection Inject 100 mg into the muscle every 28 (twenty-eight) days. For IM use only    . traMADol (ULTRAM) 50 MG tablet Take 50 mg by mouth 3 (three) times daily as needed for moderate pain.     No current facility-administered medications for this visit.    Allergies:   Morphine and related    Social History:  The patient  reports that he has been smoking Cigarettes.  He has a 50 pack-year smoking history. He has quit using smokeless tobacco. He reports that he does not drink alcohol or use illicit drugs.   Family History:  The patient's family history includes Cushing syndrome in his sister; Diabetes in his father and sister; Hepatitis B in his father; Lung cancer in his mother.    ROS: All other systems are reviewed and negative. Unless otherwise mentioned in H&P    PHYSICAL EXAM: VS:  BP 146/60 mmHg  Pulse 85  Ht 5\' 7"  (1.702 m)  Wt 215 lb (97.523 kg)  BMI 33.67 kg/m2  SpO2 95% , BMI Body mass index is 33.67 kg/(m^2). GEN: Well nourished, well developed, in no acute distress HEENT: normal Neck: no JVD, carotid bruits, or masses Cardiac: RRR; no murmurs, rubs, or gallops,no edema  Respiratory:  Clear to auscultation bilaterally, normal work of breathing. Minimal bibasilar crackles cleared with cough GI: soft, nontender, nondistended, + BS MS: no deformity or atrophy Skin: warm and dry, no rash Neuro:  Strength and sensation are intact Psych: euthymic  mood, full affect   Recent Labs: 08/07/2015: B Natriuretic Peptide 196.0* 08/08/2015: ALT 16*; BUN 13; Creatinine, Ser 0.78; Hemoglobin 14.5; Platelets 225; Potassium 4.0; Sodium 139    Lipid Panel    Component Value Date/Time   CHOL 135 08/07/2015 0509   TRIG 194* 08/07/2015 0509   HDL 38* 08/07/2015 0509   CHOLHDL 3.6 08/07/2015 0509   VLDL 39 08/07/2015 0509   LDLCALC 58 08/07/2015 0509      Wt Readings from Last 3 Encounters:  09/01/15 215 lb (97.523 kg)  08/13/15 207 lb (93.895 kg)  08/09/15 210 lb 12.2 oz (95.6 kg)      ASSESSMENT AND PLAN:  1. Coronary artery disease: history of three-vessel disease with severe LAD disease. The  patient had a cardiac catheterization in May of 2017 with 50% LAD disease and overall patent coronaries, treated medically with beta blocker, ACE inhibitor, and I will aspirin as well. He complains of generalized fatigue.   I will change his metoprolol 12.5 mg twice a day to 25 mg succinate at at bedtime. This may help daytime fatigue and help him to sleep better. Most recent echocardiogram reveals an EF of 45-50% with restrictive diastolic dysfunction. He works outside in the heat and therefore I will decrease his Lasix from 60 mg daily to 40 mg daily with a followup BMET ordered.he is given a letter to return to work, but he is advised to avoid extremely hot temperatures if possible  2. Chronic Diastolic CHF: he has no evidence of decompensation. As stated I will decrease his Lasix to avoid dehydration as he works outside in the heat. He is advised to stay hydrated to avoid dehydration. BMET is ordered.  3. Hypertension: Currently well controlled. Other the Lasix decrease no changes in medications.  4. Insomnia:the patient is advised to stop any caffeine from noontime on, he is to cut down on smoking in the evening to avoid nicotine. He is to increase his activity. He is due to see his primary care physician the first of next week, and has been  advised to talk with them about this and need for sleeping aid. He is encouraged to try again with CPAP as this will cut down on daytime tiredness, and allow him to sleep better.  Current medicines are reviewed at length with the patient today.    Labs/ tests ordered today include: BMET No orders of the defined types were placed in this encounter.     Disposition:   FU with 3 months  Signed, Jory Sims, NP  09/01/2015 3:20 PM    [facility] [address] [place], [place] [address] Phone: [phone]; Fax: [phone]

## 2015-09-01 NOTE — Progress Notes (Signed)
Name: Jonathan Gilmore    DOB: 08-Aug-1950  Age: 65 y.o.  MR#: [id number]       PCP:  Octavio Graves, DO      Insurance: Payor: MEDCOST / Plan: MEDCOST / Product Type: *No Product type* /   CC:   No chief complaint on file.   VS Filed Vitals:   09/01/15 1515  BP: 146/60  Pulse: 85  Height: 5\' 7"  (1.702 m)  Weight: 215 lb (97.523 kg)  SpO2: 95%    Weights Current Weight  09/01/15 215 lb (97.523 kg)  08/13/15 207 lb (93.895 kg)  08/09/15 210 lb 12.2 oz (95.6 kg)    Blood Pressure  BP Readings from Last 3 Encounters:  09/01/15 146/60  08/13/15 138/76  08/09/15 139/69     Admit date:  (Not on file) Last encounter with RMR:  Visit date not found   Allergy Morphine and related  Current Outpatient Prescriptions  Medication Sig Dispense Refill  . aspirin EC 81 MG tablet Take 81 mg by mouth daily.     Marland Kitchen atorvastatin (LIPITOR) 80 MG tablet Take 1 tablet (80 mg total) by mouth daily at 6 PM. 90 tablet 3  . CALCIUM PO Take 1 tablet by mouth daily.    . Cyanocobalamin (VITAMIN B 12 PO) Take 1 tablet by mouth daily.    . diphenhydrAMINE (BENADRYL) 25 MG tablet Take 100 mg by mouth at bedtime.    . Dulaglutide (TRULICITY) 1.5 0000000 SOPN Inject 0.5 mLs into the skin once a week.    . Flaxseed, Linseed, (FLAXSEED OIL PO) Take 1 tablet by mouth daily.     . furosemide (LASIX) 20 MG tablet Take 3 tablets (60 mg total) by mouth daily. 270 tablet 3  . gabapentin (NEURONTIN) 300 MG capsule Take 1 capsule (300 mg total) by mouth at bedtime. 30 capsule 0  . HYDROcodone-acetaminophen (NORCO) 10-325 MG per tablet Take 1-2 tablets by mouth every 6 (six) hours as needed for severe pain.     Marland Kitchen ibuprofen (ADVIL,MOTRIN) 200 MG tablet Take 800 mg by mouth daily as needed for moderate pain.    Marland Kitchen lisinopril (PRINIVIL,ZESTRIL) 2.5 MG tablet Take 1 tablet (2.5 mg total) by mouth daily. 90 tablet 3  . MAGNESIUM PO Take 1 tablet by mouth daily.    . metFORMIN (GLUCOPHAGE) 500 MG tablet Take 1 tablet (500  mg total) by mouth 2 (two) times daily.    . metoprolol tartrate (LOPRESSOR) 25 MG tablet Take 0.5 tablets (12.5 mg total) by mouth 2 (two) times daily. 90 tablet 3  . Multiple Vitamin (MULTIVITAMIN WITH MINERALS) TABS Take 1 tablet by mouth daily.    . nitroGLYCERIN (NITROSTAT) 0.4 MG SL tablet Place 1 tablet (0.4 mg total) under the tongue every 5 (five) minutes as needed for chest pain. 25 tablet 4  . POTASSIUM PO Take 1 tablet by mouth daily.    Marland Kitchen testosterone cypionate (DEPOTESTOTERONE CYPIONATE) 100 MG/ML injection Inject 100 mg into the muscle every 28 (twenty-eight) days. For IM use only    . traMADol (ULTRAM) 50 MG tablet Take 50 mg by mouth 3 (three) times daily as needed for moderate pain.     No current facility-administered medications for this visit.    Discontinued Meds:    Medications Discontinued During This Encounter  Medication Reason  . varenicline (CHANTIX PAK) 0.5 MG X 11 & 1 MG X 42 tablet Error    Patient Active Problem List   Diagnosis Date Noted  .  CAD -50% LAD  08/09/2015  . Moderate aortic stenosis 08/09/2015  . Acute pulmonary edema ([place]) 08/07/2015  . Acute diastolic CHF (congestive heart failure) ([place]) 08/07/2015  . Hypersomnia with sleep apnea, unspecified 09/20/2013  . Syncope 09/19/2013  . 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  . OSA (obstructive sleep apnea) -non compliant 09/19/2013  . Narcolepsy and cataplexy 09/19/2013  . Facial abscess 04/28/2013  . Facial cellulitis 04/28/2013  . Hyperlipidemia 04/28/2013  . Tobacco dependence 04/28/2013  . Sepsis ([place]) 04/28/2013  . Gout 04/28/2013  . Diastolic CHF ([place]) 0000000  . Pleuritic pain 02/24/2013  . Chest pain 02/24/2013  . Murmur 02/24/2013  . Pericarditis 02/24/2013  . DM type 2 (diabetes mellitus, type 2) ([place]) 02/24/2013  . HTN (hypertension) 02/24/2013  . Cervical spondylosis with myelopathy 01/19/2011    LABS    Component Value Date/Time   NA 139 08/08/2015 0335   NA 135  08/07/2015 0509   NA 140 09/20/2013 0620   K 4.0 08/08/2015 0335   K 3.8 08/07/2015 0509   K 4.2 09/20/2013 0620   CL 102 08/08/2015 0335   CL 97* 08/07/2015 0509   CL 101 09/20/2013 0620   CO2 31 08/08/2015 0335   CO2 30 08/07/2015 0509   CO2 29 09/20/2013 0620   GLUCOSE 159* 08/08/2015 0335   GLUCOSE 197* 08/07/2015 0509   GLUCOSE 169* 09/20/2013 0620   BUN 13 08/08/2015 0335   BUN 14 08/07/2015 0509   BUN 10 09/20/2013 0620   CREATININE 0.78 08/08/2015 0335   CREATININE 0.66 08/07/2015 0509   CREATININE 0.62 09/20/2013 0620   CALCIUM 8.8* 08/08/2015 0335   CALCIUM 8.8* 08/07/2015 0509   CALCIUM 8.9 09/20/2013 0620   GFRNONAA >60 08/08/2015 0335   GFRNONAA >60 08/07/2015 0509   GFRNONAA >90 09/20/2013 0620   GFRAA >60 08/08/2015 0335   GFRAA >60 08/07/2015 0509   GFRAA >90 09/20/2013 0620   CMP     Component Value Date/Time   NA 139 08/08/2015 0335   K 4.0 08/08/2015 0335   CL 102 08/08/2015 0335   CO2 31 08/08/2015 0335   GLUCOSE 159* 08/08/2015 0335   BUN 13 08/08/2015 0335   CREATININE 0.78 08/08/2015 0335   CALCIUM 8.8* 08/08/2015 0335   PROT 6.5 08/08/2015 0335   ALBUMIN 3.7 08/08/2015 0335   AST 17 08/08/2015 0335   ALT 16* 08/08/2015 0335   ALKPHOS 74 08/08/2015 0335   BILITOT 0.6 08/08/2015 0335   GFRNONAA >60 08/08/2015 0335   GFRAA >60 08/08/2015 0335       Component Value Date/Time   WBC 11.4* 08/08/2015 0335   WBC 20.5* 08/07/2015 0509   WBC 7.1 09/20/2013 0620   HGB 14.5 08/08/2015 0335   HGB 16.4 08/07/2015 0509   HGB 14.8 09/20/2013 0620   HCT 45.0 08/08/2015 0335   HCT 48.6 08/07/2015 0509   HCT 44.5 09/20/2013 0620   MCV 90.0 08/08/2015 0335   MCV 90.7 08/07/2015 0509   MCV 91.0 09/20/2013 0620    Lipid Panel     Component Value Date/Time   CHOL 135 08/07/2015 0509   TRIG 194* 08/07/2015 0509   HDL 38* 08/07/2015 0509   CHOLHDL 3.6 08/07/2015 0509   VLDL 39 08/07/2015 0509   LDLCALC 58 08/07/2015 0509    ABG     Component Value Date/Time   PHART 7.417 08/08/2015 1212   PCO2ART 45.0 08/08/2015 1212   PO2ART 75.0* 08/08/2015 1212   HCO3 29.0* 08/08/2015 1212   TCO2  30 08/08/2015 1212   O2SAT 95.0 08/08/2015 1212     Lab Results  Component Value Date   TSH 0.421 09/19/2013   BNP (last 3 results)  Recent Labs  08/07/15 0509  BNP 196.0*    ProBNP (last 3 results) No results for input(s): PROBNP in the last 8760 hours.  Cardiac Panel (last 3 results) No results for input(s): CKTOTAL, CKMB, TROPONINI, RELINDX in the last 72 hours.  Iron/TIBC/Ferritin/ %Sat No results found for: IRON, TIBC, FERRITIN, IRONPCTSAT   EKG Orders placed or performed during the hospital encounter of 08/07/15  . EKG 12-Lead  . EKG 12-Lead  . EKG 12-Lead  . EKG 12-Lead  . EKG     Prior Assessment and Plan Problem List as of 09/01/2015      Cardiovascular and Mediastinum   HTN (hypertension)   Last Assessment & Plan 03/26/2013 Office Visit Written 03/26/2013  4:15 PM by Lendon Colonel, NP    Blood pressure is well-controlled. Would not make any changes in his medication. Continue risk modification.      Diastolic CHF ([facility])   CAD -50% LAD    Moderate aortic stenosis   Pericarditis   Syncope   Acute diastolic CHF (congestive heart failure) ([facility])     Respiratory   OSA (obstructive sleep apnea) -non compliant   Acute pulmonary edema ([place])     Endocrine   DM type 2 (diabetes mellitus, type 2) [facility])   Last Assessment & Plan 03/06/2013 Office Visit Written 03/06/2013  3:10 PM by Lendon Colonel, NP    Not well controlled by TG elevation and by his own admission. He is followed by PCP for ongoing treatment. He uses SQ insulin prn for BG elevation.        Nervous and Auditory   Cervical spondylosis with myelopathy     Other   Hyperlipidemia   Tobacco dependence   Gout   Pleuritic pain   Chest pain   Last Assessment & Plan 03/26/2013 Office Visit Written 03/26/2013  4:14 PM by Lendon Colonel, NP    I have reviewed his stress test results with Dr. Pennelope Bracken on site. He states that unless the patient is symptomatic he would not proceed with cardiac catheterization at this time. He believes that they are low ejection fraction noted on the nuclear study was related to PVCs. And less likely related to T.I.D. it was his recommendation that repeat a limited study echo for reevaluation of LV function.  I talked with the patient about his symptoms. He states he continues to have occasional PVCs but has been for years. He denies any recurrence of chest pain or shortness of breath. He refuses repeat echocardiogram at this time. He states he has to pay off bills and would prefer not to have another test increasing his financial burden.  I have talked with the patient about this, and I suggested that if he become more symptomatic, to include pressure in his chest, dyspnea, or generalized fatigue, he is to call us at which time we will schedule a cardiac catheterization. He verbalizes understanding. He would like to wait to proceed with this unless it is necessary.      Murmur   Facial abscess   Facial cellulitis   Sepsis ([place])   Narcolepsy cataplexy syndrome   Narcolepsy and cataplexy   Hypersomnia with sleep apnea, unspecified       Imaging: Dg Chest Port 1 View  08/07/2015  CLINICAL DATA:  Midsternal chest  pain. Shortness of breath. Symptoms for 2 hours. EXAM: PORTABLE CHEST 1 VIEW COMPARISON:  09/19/2013 FINDINGS: Cardiomediastinal contours are unchanged, mild cardiomegaly. Prominent bilateral interstitial opacities consistent pulmonary edema. No large pleural effusion. No confluent airspace disease. Probable bibasilar atelectasis. No pneumothorax. IMPRESSION: Moderate CHF. Electronically Signed   By: Jeb Levering M.D.   On: 08/07/2015 05:29

## 2015-09-15 ENCOUNTER — Telehealth: Payer: Self-pay | Admitting: Cardiology

## 2015-09-15 NOTE — Telephone Encounter (Signed)
Verified that pt is on both medications.

## 2015-09-15 NOTE — Telephone Encounter (Signed)
Patient's wife is asking if patient is supposed to be taking Lisinopril and Metoprolol. / tg

## 2015-10-28 ENCOUNTER — Encounter: Payer: Self-pay | Admitting: Cardiology

## 2015-10-30 ENCOUNTER — Encounter (INDEPENDENT_AMBULATORY_CARE_PROVIDER_SITE_OTHER): Payer: Self-pay | Admitting: Internal Medicine

## 2015-11-12 ENCOUNTER — Ambulatory Visit (INDEPENDENT_AMBULATORY_CARE_PROVIDER_SITE_OTHER): Payer: BLUE CROSS/BLUE SHIELD | Admitting: Internal Medicine

## 2015-11-12 ENCOUNTER — Encounter (INDEPENDENT_AMBULATORY_CARE_PROVIDER_SITE_OTHER): Payer: Self-pay | Admitting: Internal Medicine

## 2015-11-18 ENCOUNTER — Ambulatory Visit: Payer: PRIVATE HEALTH INSURANCE | Admitting: Cardiology

## 2015-11-24 ENCOUNTER — Ambulatory Visit: Payer: PRIVATE HEALTH INSURANCE | Admitting: Cardiology

## 2015-12-12 ENCOUNTER — Encounter: Payer: Self-pay | Admitting: Cardiology

## 2015-12-12 ENCOUNTER — Ambulatory Visit (INDEPENDENT_AMBULATORY_CARE_PROVIDER_SITE_OTHER): Payer: PRIVATE HEALTH INSURANCE | Admitting: Cardiology

## 2015-12-12 VITALS — BP 138/70 | HR 74 | Ht 67.0 in | Wt 220.0 lb

## 2015-12-12 DIAGNOSIS — I10 Essential (primary) hypertension: Secondary | ICD-10-CM

## 2015-12-12 DIAGNOSIS — G4733 Obstructive sleep apnea (adult) (pediatric): Secondary | ICD-10-CM | POA: Diagnosis not present

## 2015-12-12 DIAGNOSIS — Z136 Encounter for screening for cardiovascular disorders: Secondary | ICD-10-CM | POA: Diagnosis not present

## 2015-12-12 DIAGNOSIS — E782 Mixed hyperlipidemia: Secondary | ICD-10-CM

## 2015-12-12 DIAGNOSIS — I2510 Atherosclerotic heart disease of native coronary artery without angina pectoris: Secondary | ICD-10-CM

## 2015-12-12 DIAGNOSIS — I5032 Chronic diastolic (congestive) heart failure: Secondary | ICD-10-CM

## 2015-12-12 DIAGNOSIS — Z720 Tobacco use: Secondary | ICD-10-CM

## 2015-12-12 NOTE — Progress Notes (Signed)
Clinical Summary Mr. Basch is a 65 y.o.male seen today for follow up of the following medical problems.   1. CAD - CT scan chest 02/2013 with 3 vessel CAD, severe LAD disease. - Jan 2015 nuclear stress without clear ischemia - admit 07/2015 with acute pulmonary edema in setting of severe HTN. Referred for cath.  - cath 07/2015 with 50% LAD disease, overall patent coronaries - his presentation with pulmonary edema likely secondary to severe HTN in setting of restrictive diastolic dysfunction. - echo 07/2015 LVEF 45-50%, restrictive diastolic dysfunction   - no recent chest pain. No SOB or DOE. No recent LE edema. Home scale stable 215 lbs.  - compliant with meds    2. Severe OSA - not using CPAP machine due to discomfort. Stopped several years ago - last visit referred to Dr Luan Pulling.He has appt coming up.    3. Hyperlipidemia - 07/2015 TC 135 TG 194 HDL 38 LDL 58 - compliant with statin  4. Tobacco use - ongoing for 50 years. Working to quit. Patches did not help.  - tried e cigs without much benefit.   5. Aortic stenosis - echo 07/2015 mild to moderate. Mead gradient 16, AVA reported at 0.9 with dimensionless index 0.3 - no recent symptoms  6. HTN - compliant with meds  7. Chronic diastolic HF - echo [id number] with restrictive diastolic dysfunction. Low normal to mildly decreased LVEF at 45-50%.  - home weights stable. No significnat SOB/DOE/LE edema  8. DM2  - followed by pcp  9. AAA screen - has not had done previously   SH: his wife, Karandeep Haskill is also a patient of mine.  Past Medical History:  Diagnosis Date  . Abnormal cardiovascular stress test 03/2013  . Back pain   . CHF (congestive heart failure) ([place])   . Coronary artery disease    Multivessel with significant LAD involvement by chest CT 2014  . Diastolic dysfunction   . Essential hypertension   . Lumbar herniated disc    L4-L5  . MRSA (methicillin resistant staph aureus) culture  positive   . Shortness of breath dyspnea   . Sleep apnea   . Type 2 diabetes mellitus (HCC)      Allergies  Allergen Reactions  . Morphine And Related Nausea And Vomiting     Current Outpatient Prescriptions  Medication Sig Dispense Refill  . aspirin EC 81 MG tablet Take 81 mg by mouth daily.     Marland Kitchen atorvastatin (LIPITOR) 80 MG tablet Take 1 tablet (80 mg total) by mouth daily at 6 PM. 90 tablet 3  . CALCIUM PO Take 1 tablet by mouth daily.    . Cyanocobalamin (VITAMIN B 12 PO) Take 1 tablet by mouth daily.    . diphenhydrAMINE (BENADRYL) 25 MG tablet Take 100 mg by mouth at bedtime.    . Dulaglutide (TRULICITY) 1.5 0000000 SOPN Inject 0.5 mLs into the skin once a week.    . Flaxseed, Linseed, (FLAXSEED OIL PO) Take 1 tablet by mouth daily.     . furosemide (LASIX) 40 MG tablet Take 1 tablet (40 mg total) by mouth daily. 90 tablet 3  . gabapentin (NEURONTIN) 300 MG capsule Take 1 capsule (300 mg total) by mouth at bedtime. 30 capsule 0  . HYDROcodone-acetaminophen (NORCO) 10-325 MG per tablet Take 1-2 tablets by mouth every 6 (six) hours as needed for severe pain.     Marland Kitchen ibuprofen (ADVIL,MOTRIN) 200 MG tablet Take 800 mg by mouth daily  as needed for moderate pain.    Marland Kitchen lisinopril (PRINIVIL,ZESTRIL) 2.5 MG tablet Take 1 tablet (2.5 mg total) by mouth daily. 90 tablet 3  . MAGNESIUM PO Take 1 tablet by mouth daily.    . metFORMIN (GLUCOPHAGE) 500 MG tablet Take 1 tablet (500 mg total) by mouth 2 (two) times daily.    . metoprolol succinate (TOPROL XL) 25 MG 24 hr tablet Take 1 tablet (25 mg total) by mouth daily. 30 tablet 11  . Multiple Vitamin (MULTIVITAMIN WITH MINERALS) TABS Take 1 tablet by mouth daily.    . nitroGLYCERIN (NITROSTAT) 0.4 MG SL tablet Place 1 tablet (0.4 mg total) under the tongue every 5 (five) minutes as needed for chest pain. 25 tablet 4  . POTASSIUM PO Take 1 tablet by mouth daily.    Marland Kitchen testosterone cypionate (DEPOTESTOTERONE CYPIONATE) 100 MG/ML injection  Inject 100 mg into the muscle every 28 (twenty-eight) days. For IM use only    . traMADol (ULTRAM) 50 MG tablet Take 50 mg by mouth 3 (three) times daily as needed for moderate pain.     No current facility-administered medications for this visit.      Past Surgical History:  Procedure Laterality Date  . ANTERIOR CERVICAL DECOMP/DISCECTOMY FUSION  01/18/2011   Procedure: ANTERIOR CERVICAL DECOMPRESSION/DISCECTOMY FUSION 2 LEVELS;  Surgeon: Cooper Render Pool;  Location: [facility];  Service: Neurosurgery;  Laterality: N/A;  anterior cervical discectomy and fusion with allograft and plating, cervical five-six, cervical six-seven  . BIOPSY N/A 04/04/2015   Procedure: BIOPSY;  Surgeon: Rogene Houston, MD;  [facility];  Service: Endoscopy;  Laterality: N/A;  . Bone spur removed  1997   Left shoulder  . CARDIAC CATHETERIZATION N/A 08/08/2015   Procedure: Right/Left Heart Cath and Coronary Angiography;  Surgeon: Sherren Mocha, MD;  Location: [facility];  Service: Cardiovascular;  Laterality: N/A;  . CARPAL TUNNEL RELEASE Right   . ESOPHAGEAL DILATION N/A 04/04/2015   Procedure: ESOPHAGEAL DILATION;  Surgeon: Rogene Houston, MD;  [facility];  Service: Endoscopy;  Laterality: N/A;  . ESOPHAGOGASTRODUODENOSCOPY N/A 04/04/2015   Procedure: ESOPHAGOGASTRODUODENOSCOPY (EGD);  Surgeon: Rogene Houston, MD;  [facility]: [facility];  Service: Endoscopy;  Laterality: N/A;  1240  . HEMORRHOID SURGERY       Allergies  Allergen Reactions  . Morphine And Related Nausea And Vomiting      Family History  Problem Relation Age of Onset  . Lung cancer Mother   . Diabetes Father   . Diabetes Sister   . Cushing syndrome Sister   . Hepatitis B Father      Social History Mr. Urquijo reports that he has been smoking Cigarettes.  He has a 50.00 pack-year smoking history. He has quit using smokeless tobacco. Mr. Borey reports that he does not drink alcohol.   Review of  Systems CONSTITUTIONAL: No weight loss, fever, chills, weakness or fatigue.  HEENT: Eyes: No visual loss, blurred vision, double vision or yellow sclerae.No hearing loss, sneezing, congestion, runny nose or sore throat.  SKIN: No rash or itching.  CARDIOVASCULAR: per hpi RESPIRATORY: No shortness of breath, cough or sputum.  GASTROINTESTINAL: No anorexia, nausea, vomiting or diarrhea. No abdominal pain or blood.  GENITOURINARY: No burning on urination, no polyuria NEUROLOGICAL: No headache, dizziness, syncope, paralysis, ataxia, numbness or tingling in the extremities. No change in bowel or bladder control.  MUSCULOSKELETAL: No muscle, back pain, joint pain or stiffness.  LYMPHATICS: No enlarged  nodes. No history of splenectomy.  PSYCHIATRIC: No history of depression or anxiety.  ENDOCRINOLOGIC: No reports of sweating, cold or heat intolerance. No polyuria or polydipsia.  Marland Kitchen   Physical Examination Vitals:   12/12/15 0917  BP: 138/70  Pulse: 74   Vitals:   12/12/15 0917  Weight: 220 lb (99.8 kg)  Height: 5\' 7"  (1.702 m)    Gen: resting comfortably, no acute distress HEENT: no scleral icterus, pupils equal round and reactive, no palptable cervical adenopathy,  CV: RRR, no m/r/g, no jvd Resp: Clear to auscultation bilaterally GI: abdomen is soft, non-tender, non-distended, normal bowel sounds, no hepatosplenomegaly MSK: extremities are warm, no edema.  Skin: warm, no rash Neuro:  no focal deficits Psych: appropriate affect   Diagnostic Studies Jan 2015 MPI IMPRESSION: 1. Abnormal exercise myocardial perfusion imaging stress test  2. Inferior wall defect likely due to subdiaphragmatic attenuation, its wall motion is similar to other areas without a perfusion defect.  3. Likely small anteroseptal infract, this area is more hypokinetic than the rest of the myocardium  3. Low left ventricular systolic function with global hypokinesis  4. Mildly reduced functional  capacity (90% of age and gender predicted)  5. Overall increased risk for major cardiac events based on low ejection fraction. There is no current myocardium at jeopardy.   02/2013 echo Study Conclusions  - Left ventricle: The cavity size was normal. There was mild concentric hypertrophy. Systolic function was normal. The estimated ejection fraction was in the range of 55% to 60%. Wall motion was normal; there were no regional wall motion abnormalities. There is mildly asynchronous contraction consistent with intraventricular conduction delay (IVCD) or bundle Yazeed Pryer block. Doppler parameters are consistent with abnormal left ventricular relaxation (grade 1 diastolic dysfunction). - Aortic valve: Cusp separation was mildly reduced. There was mild to moderate stenosis. - Left atrium: The atrium was moderately dilated.  07/2015 echo  Study Conclusions  - Left ventricle: The cavity size was normal. Wall thickness was  increased in a pattern of mild LVH. Systolic function was mildly  reduced. The estimated ejection fraction was in the range of 45%  to 50%. Diffuse hypokinesis. Doppler parameters are consistent  with restrictive physiology, indicative of decreased left  ventricular diastolic compliance and/or increased left atrial  pressure. Doppler parameters are consistent with high ventricular  filling pressure. - Aortic valve: Valve mobility was restricted. There was mild to  moderate stenosis. Valve area (VTI): 0.96 cm^2. Valve area  (Vmax): 0.91 cm^2. Valve area (Vmean): 0.89 cm^2. - Mitral valve: Calcified annulus. There was mild regurgitation. - Left atrium: The atrium was moderately dilated. - Right atrium: The atrium was mildly dilated. - Atrial septum: There was a patent foramen ovale.  Impressions:  - Mild global reduction in LV function; restrictive filling with  elevated filling pressure; mild LVH; biatrial enlargement;   heavily calcified aortic valve with probable mild to moderate AS  (mean gradient 16 mmHg; calculated AVA .9 cm2 likely  overestimates severity; dimensionless index .3 not supportive of  severe AS); mild MR; patent foramen ovale.  07/2015 Cath  Prox LAD to Mid LAD lesion, 50% stenosed.  1. Calcified moderate proximal-mid LAD stenosis 2. Widely patent, dominant LCx 3. Preserved cardiac output, mildly elevated right-sided intracardiac pressures  Recommend: Medical therapy for nonobstructive CAD and CHF     Assessment and Plan  1. CAD - moderate nonobstructive disease by recent cath.  - no recent symptoms - continue current meds  2. OSA - he is  to see Dr Luan Pulling for evaluatoin  3. Hyperlipidemia - LDL at goal, TGs elevated. Counseled on dietary changes to lower TGs.  - continue current meds  4. Tobacco abuse - encouraged to quit  5. Aortic stenosis - mild to moderate by most recent echo  - we will continue to monitor  6. HTN - at goal, continue current meds  7. Chronic diasotlic heart failure - appears euvolemic today, no symptoms conitnue current meds  8. AAA screen - 64 yo male smoker, we will order AAA screening US   F/u 6 months  Arnoldo Lenis, M.D.

## 2015-12-12 NOTE — Patient Instructions (Signed)
Your physician wants you to follow-up in: [place] DR. BRANCH You will receive a reminder letter in the mail two months in advance. If you don't receive a letter, please call our office to schedule the follow-up appointment.  Your physician recommends that you continue on your current medications as directed. Please refer to the Current Medication list given to you today.  Your physician has requested that you have an abdominal aorta duplex. During this test, an ultrasound is used to evaluate the aorta. Allow 30 minutes for this exam. Do not eat after midnight the day before and avoid carbonated beverages  You have been referred to DR Luan Pulling  Thank you for choosing [facility]!!

## 2016-01-22 ENCOUNTER — Ambulatory Visit: Payer: BLUE CROSS/BLUE SHIELD

## 2016-01-22 DIAGNOSIS — Z136 Encounter for screening for cardiovascular disorders: Secondary | ICD-10-CM

## 2016-01-22 DIAGNOSIS — I10 Essential (primary) hypertension: Secondary | ICD-10-CM

## 2016-03-17 DIAGNOSIS — Z6835 Body mass index (BMI) 35.0-35.9, adult: Secondary | ICD-10-CM | POA: Diagnosis not present

## 2016-03-17 DIAGNOSIS — Z794 Long term (current) use of insulin: Secondary | ICD-10-CM | POA: Diagnosis not present

## 2016-03-17 DIAGNOSIS — R891 Abnormal level of hormones in specimens from other organs, systems and tissues: Secondary | ICD-10-CM | POA: Diagnosis not present

## 2016-03-17 DIAGNOSIS — Z0000 Encounter for general adult medical examination without abnormal findings: Secondary | ICD-10-CM | POA: Diagnosis not present

## 2016-03-17 DIAGNOSIS — I110 Hypertensive heart disease with heart failure: Secondary | ICD-10-CM | POA: Diagnosis not present

## 2016-03-17 DIAGNOSIS — E1140 Type 2 diabetes mellitus with diabetic neuropathy, unspecified: Secondary | ICD-10-CM | POA: Diagnosis not present

## 2016-03-17 DIAGNOSIS — Z7982 Long term (current) use of aspirin: Secondary | ICD-10-CM | POA: Diagnosis not present

## 2016-03-17 DIAGNOSIS — R69 Illness, unspecified: Secondary | ICD-10-CM | POA: Diagnosis not present

## 2016-03-17 DIAGNOSIS — I509 Heart failure, unspecified: Secondary | ICD-10-CM | POA: Diagnosis not present

## 2016-03-17 DIAGNOSIS — E669 Obesity, unspecified: Secondary | ICD-10-CM | POA: Diagnosis not present

## 2016-03-30 DIAGNOSIS — H903 Sensorineural hearing loss, bilateral: Secondary | ICD-10-CM | POA: Diagnosis not present

## 2016-03-30 DIAGNOSIS — H9192 Unspecified hearing loss, left ear: Secondary | ICD-10-CM | POA: Diagnosis not present

## 2016-03-30 DIAGNOSIS — H9042 Sensorineural hearing loss, unilateral, left ear, with unrestricted hearing on the contralateral side: Secondary | ICD-10-CM | POA: Diagnosis not present

## 2016-03-30 DIAGNOSIS — H6522 Chronic serous otitis media, left ear: Secondary | ICD-10-CM | POA: Diagnosis not present

## 2016-04-09 DIAGNOSIS — H6522 Chronic serous otitis media, left ear: Secondary | ICD-10-CM | POA: Diagnosis not present

## 2016-05-19 DIAGNOSIS — H9042 Sensorineural hearing loss, unilateral, left ear, with unrestricted hearing on the contralateral side: Secondary | ICD-10-CM | POA: Diagnosis not present

## 2016-05-19 DIAGNOSIS — Z9622 Myringotomy tube(s) status: Secondary | ICD-10-CM | POA: Diagnosis not present

## 2016-06-16 DIAGNOSIS — M5136 Other intervertebral disc degeneration, lumbar region: Secondary | ICD-10-CM | POA: Diagnosis not present

## 2016-06-16 DIAGNOSIS — I10 Essential (primary) hypertension: Secondary | ICD-10-CM | POA: Diagnosis not present

## 2016-06-16 DIAGNOSIS — R69 Illness, unspecified: Secondary | ICD-10-CM | POA: Diagnosis not present

## 2016-06-16 DIAGNOSIS — E109 Type 1 diabetes mellitus without complications: Secondary | ICD-10-CM | POA: Diagnosis not present

## 2016-06-16 DIAGNOSIS — G629 Polyneuropathy, unspecified: Secondary | ICD-10-CM | POA: Diagnosis not present

## 2016-06-16 DIAGNOSIS — E782 Mixed hyperlipidemia: Secondary | ICD-10-CM | POA: Diagnosis not present

## 2016-06-16 DIAGNOSIS — Z6833 Body mass index (BMI) 33.0-33.9, adult: Secondary | ICD-10-CM | POA: Diagnosis not present

## 2016-06-16 DIAGNOSIS — Z7984 Long term (current) use of oral hypoglycemic drugs: Secondary | ICD-10-CM | POA: Diagnosis not present

## 2016-06-16 DIAGNOSIS — E2740 Unspecified adrenocortical insufficiency: Secondary | ICD-10-CM | POA: Diagnosis not present

## 2016-06-16 DIAGNOSIS — I739 Peripheral vascular disease, unspecified: Secondary | ICD-10-CM | POA: Diagnosis not present

## 2016-06-18 ENCOUNTER — Other Ambulatory Visit (HOSPITAL_COMMUNITY): Payer: Self-pay | Admitting: *Deleted

## 2016-06-18 DIAGNOSIS — I739 Peripheral vascular disease, unspecified: Secondary | ICD-10-CM

## 2016-06-22 ENCOUNTER — Other Ambulatory Visit (HOSPITAL_COMMUNITY): Payer: Self-pay | Admitting: *Deleted

## 2016-06-22 ENCOUNTER — Ambulatory Visit (HOSPITAL_COMMUNITY)
Admission: RE | Admit: 2016-06-22 | Discharge: 2016-06-22 | Disposition: A | Discharge status: Home / Self Care | Payer: Medicare HMO | Source: Ambulatory Visit | Attending: *Deleted | Admitting: *Deleted

## 2016-06-22 DIAGNOSIS — I739 Peripheral vascular disease, unspecified: Secondary | ICD-10-CM | POA: Diagnosis not present

## 2016-06-23 DIAGNOSIS — E2740 Unspecified adrenocortical insufficiency: Secondary | ICD-10-CM | POA: Diagnosis not present

## 2016-06-23 DIAGNOSIS — I739 Peripheral vascular disease, unspecified: Secondary | ICD-10-CM | POA: Diagnosis not present

## 2016-06-23 DIAGNOSIS — M5136 Other intervertebral disc degeneration, lumbar region: Secondary | ICD-10-CM | POA: Diagnosis not present

## 2016-06-23 DIAGNOSIS — G629 Polyneuropathy, unspecified: Secondary | ICD-10-CM | POA: Diagnosis not present

## 2016-06-23 DIAGNOSIS — I10 Essential (primary) hypertension: Secondary | ICD-10-CM | POA: Diagnosis not present

## 2016-06-23 DIAGNOSIS — E109 Type 1 diabetes mellitus without complications: Secondary | ICD-10-CM | POA: Diagnosis not present

## 2016-06-23 DIAGNOSIS — E782 Mixed hyperlipidemia: Secondary | ICD-10-CM | POA: Diagnosis not present

## 2016-06-24 DIAGNOSIS — M5136 Other intervertebral disc degeneration, lumbar region: Secondary | ICD-10-CM | POA: Diagnosis not present

## 2016-06-24 DIAGNOSIS — I739 Peripheral vascular disease, unspecified: Secondary | ICD-10-CM | POA: Diagnosis not present

## 2016-06-24 DIAGNOSIS — R69 Illness, unspecified: Secondary | ICD-10-CM | POA: Diagnosis not present

## 2016-06-24 DIAGNOSIS — Z7984 Long term (current) use of oral hypoglycemic drugs: Secondary | ICD-10-CM | POA: Diagnosis not present

## 2016-06-24 DIAGNOSIS — Z6833 Body mass index (BMI) 33.0-33.9, adult: Secondary | ICD-10-CM | POA: Diagnosis not present

## 2016-06-24 DIAGNOSIS — I7090 Unspecified atherosclerosis: Secondary | ICD-10-CM | POA: Diagnosis not present

## 2016-06-25 ENCOUNTER — Other Ambulatory Visit (HOSPITAL_COMMUNITY): Payer: Self-pay | Admitting: *Deleted

## 2016-06-25 DIAGNOSIS — I7090 Unspecified atherosclerosis: Secondary | ICD-10-CM

## 2016-06-29 ENCOUNTER — Ambulatory Visit (HOSPITAL_COMMUNITY)
Admission: RE | Admit: 2016-06-29 | Discharge: 2016-06-29 | Disposition: A | Discharge status: Home / Self Care | Payer: Medicare HMO | Source: Ambulatory Visit | Attending: *Deleted | Admitting: *Deleted

## 2016-06-29 DIAGNOSIS — I6523 Occlusion and stenosis of bilateral carotid arteries: Secondary | ICD-10-CM | POA: Diagnosis not present

## 2016-06-29 DIAGNOSIS — I7090 Unspecified atherosclerosis: Secondary | ICD-10-CM | POA: Diagnosis present

## 2016-07-20 ENCOUNTER — Other Ambulatory Visit: Payer: Self-pay | Admitting: *Deleted

## 2016-07-20 DIAGNOSIS — I739 Peripheral vascular disease, unspecified: Secondary | ICD-10-CM

## 2016-07-21 ENCOUNTER — Encounter: Payer: Self-pay | Admitting: Vascular Surgery

## 2016-07-26 ENCOUNTER — Encounter (HOSPITAL_COMMUNITY): Payer: Medicare HMO

## 2016-07-27 ENCOUNTER — Ambulatory Visit (INDEPENDENT_AMBULATORY_CARE_PROVIDER_SITE_OTHER): Payer: Medicare HMO | Admitting: Vascular Surgery

## 2016-07-27 ENCOUNTER — Ambulatory Visit (HOSPITAL_COMMUNITY)
Admission: RE | Admit: 2016-07-27 | Discharge: 2016-07-27 | Disposition: A | Discharge status: Home / Self Care | Payer: Medicare HMO | Source: Ambulatory Visit | Attending: Vascular Surgery | Admitting: Vascular Surgery

## 2016-07-27 ENCOUNTER — Encounter: Payer: Self-pay | Admitting: Vascular Surgery

## 2016-07-27 VITALS — BP 147/65 | HR 59 | Temp 99.0°F | Resp 16 | Ht 67.0 in | Wt 213.0 lb

## 2016-07-27 DIAGNOSIS — I70293 Other atherosclerosis of native arteries of extremities, bilateral legs: Secondary | ICD-10-CM | POA: Insufficient documentation

## 2016-07-27 DIAGNOSIS — I739 Peripheral vascular disease, unspecified: Secondary | ICD-10-CM

## 2016-07-27 LAB — VAS US LOWER EXTREMITY ARTERIAL DUPLEX
LSFMPSV: 0 cm/s
Left ant tibial distal sys: 37 cm/s
Left super femoral dist sys PSV: -55 cm/s
Left super femoral prox sys PSV: 55 cm/s
RATIBDISTSYS: 45 cm/s
RSFMPSV: -138 cm/s
RSFPPSV: 89 cm/s
RTIBDISTSYS: 12 cm/s
Right super femoral dist sys PSV: -453 cm/s
left post tibial dist sys: 31 cm/s

## 2016-07-27 NOTE — Progress Notes (Signed)
[facility] Specialist of [facility]  Patient name: Jonathan Gilmore MRN: [id number] DOB: [birth year] Sex: male  REASON FOR CONSULT: Evaluation bilateral lower extremity arterial insufficiency and claudication  HPI: Jonathan Gilmore is a 66 y.o. male, who is seen today to discuss bilateral lower extremity calf claudication symptoms. He is here today with his wife. He reports several month history of progressive discomfort in both lower extremity's. This is equal right and left leg. He reports no discomfort at rest but if he walks for any distance he has cramping discomfort and tiredness in both calves requiring him to stop. He does report some tingling in his feet bilaterally more consistent with neuropathy but no tissue loss or true arterial rest pain symptoms. He is able to walk approximately 200 yards before he has pain. Does have a history of congestive heart failure and underwent cardiac catheterization May 2017 suggesting multilevel disease to be treated with medical management. Does have a history of diabetes. Does occasionally require insulin injections if his sugars are running high.  He does not have any arterial rest pain. Does have a history of degenerative disc disease. Does have a long history of cigarette smoking and is currently active as well.  Past Medical History:  Diagnosis Date  . Abnormal cardiovascular stress test 03/2013  . Back pain   . CHF (congestive heart failure) ([place])   . Coronary artery disease    Multivessel with significant LAD involvement by chest CT 2014  . Diastolic dysfunction   . Essential hypertension   . Lumbar herniated disc    L4-L5  . MRSA (methicillin resistant staph aureus) culture positive   . Shortness of breath dyspnea   . Sleep apnea   . Type 2 diabetes mellitus (HCC)     Family History  Problem Relation Age of Onset  . Lung cancer Mother   . Diabetes Father   . Diabetes Sister   . Cushing syndrome  Sister   . Hepatitis B Father     SOCIAL HISTORY: Social History   Social History  . Marital status: Single    Spouse name: N/A  . Number of children: N/A  . Years of education: N/A   Occupational History  . Not on file.   Social History Main Topics  . Smoking status: Current Every Day Smoker    Packs/day: 1.00    Years: 50.00    Types: Cigarettes  . Smokeless tobacco: Former Systems developer     Comment: smoking x 50 yrs  . Alcohol use No  . Drug use: No  . Sexual activity: Yes   Other Topics Concern  . Not on file   Social History Narrative  . No narrative on file    Allergies  Allergen Reactions  . Morphine And Related Nausea And Vomiting    Current Outpatient Prescriptions  Medication Sig Dispense Refill  . aspirin EC 81 MG tablet Take 325 mg by mouth daily.     Marland Kitchen atorvastatin (LIPITOR) 80 MG tablet Take 1 tablet (80 mg total) by mouth daily at 6 PM. 90 tablet 3  . CALCIUM PO Take 1 tablet by mouth daily.    . Cyanocobalamin (VITAMIN B 12 PO) Take 1 tablet by mouth daily.    . diphenhydrAMINE (BENADRYL) 25 MG tablet Take 100 mg by mouth at bedtime.    . Dulaglutide (TRULICITY) 1.5 WU/8.8BV SOPN Inject 0.5 mLs into the skin once a week.    . Flaxseed, Linseed, (FLAXSEED OIL PO)  Take 1 tablet by mouth daily.     . furosemide (LASIX) 40 MG tablet Take 1 tablet (40 mg total) by mouth daily. 90 tablet 3  . gabapentin (NEURONTIN) 300 MG capsule Take 1 capsule (300 mg total) by mouth at bedtime. 30 capsule 0  . ibuprofen (ADVIL,MOTRIN) 200 MG tablet Take 800 mg by mouth daily as needed for moderate pain.    Marland Kitchen insulin glargine (LANTUS) 100 UNIT/ML injection Inject 100 Units into the skin at bedtime.    Marland Kitchen lisinopril (PRINIVIL,ZESTRIL) 2.5 MG tablet Take 1 tablet (2.5 mg total) by mouth daily. 90 tablet 3  . MAGNESIUM PO Take 1 tablet by mouth daily.    . metFORMIN (GLUCOPHAGE) 500 MG tablet Take 1 tablet (500 mg total) by mouth 2 (two) times daily.    . metoprolol succinate  (TOPROL XL) 25 MG 24 hr tablet Take 1 tablet (25 mg total) by mouth daily. 30 tablet 11  . Multiple Vitamin (MULTIVITAMIN WITH MINERALS) TABS Take 1 tablet by mouth daily.    Marland Kitchen POTASSIUM PO Take 1 tablet by mouth daily.    Marland Kitchen testosterone cypionate (DEPOTESTOTERONE CYPIONATE) 100 MG/ML injection Inject 100 mg into the muscle every 28 (twenty-eight) days. For IM use only    . traMADol (ULTRAM) 50 MG tablet Take 50 mg by mouth 3 (three) times daily as needed for moderate pain.    Marland Kitchen HYDROcodone-acetaminophen (NORCO) 10-325 MG per tablet Take 1-2 tablets by mouth every 6 (six) hours as needed for severe pain.     . nitroGLYCERIN (NITROSTAT) 0.4 MG SL tablet Place 1 tablet (0.4 mg total) under the tongue every 5 (five) minutes as needed for chest pain. (Patient not taking: Reported on 07/27/2016) 25 tablet 4   No current facility-administered medications for this visit.     REVIEW OF SYSTEMS:  [X]  denotes positive finding, [ ]  denotes negative finding Cardiac  Comments:  Chest pain or chest pressure: x   Shortness of breath upon exertion:    Short of breath when lying flat:    Irregular heart rhythm:        Vascular    Pain in calf, thigh, or hip brought on by ambulation: x   Pain in feet at night that wakes you up from your sleep:     Blood clot in your veins:    Leg swelling:  x       Pulmonary    Oxygen at home:    Productive cough:     Wheezing:         Neurologic    Sudden weakness in arms or legs:  x   Sudden numbness in arms or legs:  x   Sudden onset of difficulty speaking or slurred speech:    Temporary loss of vision in one eye:     Problems with dizziness:         Gastrointestinal    Blood in stool:     Vomited blood:         Genitourinary    Burning when urinating:     Blood in urine:        Psychiatric    Major depression:         Hematologic    Bleeding problems:    Problems with blood clotting too easily:        Skin    Rashes or ulcers:          Constitutional    Fever or chills:  PHYSICAL EXAM: Vitals:   07/27/16 1355 07/27/16 1356  BP: (!) 143/75 (!) 147/65  Pulse: (!) 59   Resp: 16   Temp: 99 F (37.2 C)   SpO2: 95%   Weight: 213 lb (96.6 kg)   Height: 5\' 7"  (1.702 m)     GENERAL: The patient is a well-nourished male, in no acute distress. The vital signs are documented above. CARDIOVASCULAR: 2+ radial and 2+ femoral pulses bilaterally. Absent popliteal and distal pulses bilaterally. Carotid arteries without bruits bilaterally. PULMONARY: There is good air exchange  ABDOMEN: Soft and non-tender  MUSCULOSKELETAL: There are no major deformities or cyanosis. NEUROLOGIC: No focal weakness or paresthesias are detected. SKIN: There are no ulcers or rashes noted. PSYCHIATRIC: The patient has a normal affect.  DATA:  Noninvasive studies at any 10 hospital revealed the ankle arm index of 0.65 on the right and 0.54 on the left. Duplex imaging in our office today suggest triphasic waveforms in the common femoral and deep femoral artery with evidence of superficial femoral artery occlusive disease with either high-grade stenosis or occlusion bilaterally.  Did undergo noninvasive carotid duplex at any. In as well on 06/29/2016 showing no evidence of significant stenosis  MEDICAL ISSUES: Had long discussion with patient and his wife present. Explained that he has classic bilateral lower extremity calf claudication related to superficial femoral artery occlusive disease. I explained the critical importance of absolute smoking cessation to prevent progression of disease do more dangerous form and also to improve his walking ability. I'm hurt encouraged him to continue a scheduled walking program on a daily basis. He currently is not having critical limb ischemia. Have suggested observation only at this time and would recommend arteriography only for progressive symptoms that he is unable to tolerate. I did review signs of more  critical ischemia including tissue loss he reports that he will notify us immediately should this occur. We will see him in 3 months for continued discussion. I did discuss the possible use of Pletal but will defer this for now as he continues his walking program.   Rosetta Posner, MD [facility] Tel [phone] Pager [phone]

## 2016-08-06 ENCOUNTER — Encounter: Payer: Medicare HMO | Admitting: Vascular Surgery

## 2016-08-13 DIAGNOSIS — R69 Illness, unspecified: Secondary | ICD-10-CM | POA: Diagnosis not present

## 2016-08-15 ENCOUNTER — Other Ambulatory Visit: Payer: Self-pay | Admitting: Adult Health

## 2016-08-17 ENCOUNTER — Other Ambulatory Visit: Payer: Self-pay | Admitting: Cardiology

## 2016-08-17 DIAGNOSIS — R69 Illness, unspecified: Secondary | ICD-10-CM | POA: Diagnosis not present

## 2016-08-25 ENCOUNTER — Encounter: Payer: Medicare HMO | Admitting: Vascular Surgery

## 2016-08-25 ENCOUNTER — Encounter (HOSPITAL_COMMUNITY): Payer: Medicare HMO

## 2016-09-08 DIAGNOSIS — I10 Essential (primary) hypertension: Secondary | ICD-10-CM | POA: Diagnosis not present

## 2016-09-08 DIAGNOSIS — Z794 Long term (current) use of insulin: Secondary | ICD-10-CM | POA: Diagnosis not present

## 2016-09-08 DIAGNOSIS — Z7984 Long term (current) use of oral hypoglycemic drugs: Secondary | ICD-10-CM | POA: Diagnosis not present

## 2016-09-08 DIAGNOSIS — R69 Illness, unspecified: Secondary | ICD-10-CM | POA: Diagnosis not present

## 2016-09-08 DIAGNOSIS — E109 Type 1 diabetes mellitus without complications: Secondary | ICD-10-CM | POA: Diagnosis not present

## 2016-09-08 DIAGNOSIS — E2740 Unspecified adrenocortical insufficiency: Secondary | ICD-10-CM | POA: Diagnosis not present

## 2016-09-08 DIAGNOSIS — M5136 Other intervertebral disc degeneration, lumbar region: Secondary | ICD-10-CM | POA: Diagnosis not present

## 2016-09-08 DIAGNOSIS — G629 Polyneuropathy, unspecified: Secondary | ICD-10-CM | POA: Diagnosis not present

## 2016-09-08 DIAGNOSIS — E782 Mixed hyperlipidemia: Secondary | ICD-10-CM | POA: Diagnosis not present

## 2016-09-12 ENCOUNTER — Other Ambulatory Visit: Payer: Self-pay | Admitting: Adult Health

## 2016-10-25 ENCOUNTER — Encounter: Payer: Self-pay | Admitting: Vascular Surgery

## 2016-10-25 DIAGNOSIS — E782 Mixed hyperlipidemia: Secondary | ICD-10-CM | POA: Diagnosis not present

## 2016-10-25 DIAGNOSIS — E109 Type 1 diabetes mellitus without complications: Secondary | ICD-10-CM | POA: Diagnosis not present

## 2016-10-25 DIAGNOSIS — R69 Illness, unspecified: Secondary | ICD-10-CM | POA: Diagnosis not present

## 2016-10-25 DIAGNOSIS — I10 Essential (primary) hypertension: Secondary | ICD-10-CM | POA: Diagnosis not present

## 2016-10-25 DIAGNOSIS — M5136 Other intervertebral disc degeneration, lumbar region: Secondary | ICD-10-CM | POA: Diagnosis not present

## 2016-10-25 DIAGNOSIS — I739 Peripheral vascular disease, unspecified: Secondary | ICD-10-CM | POA: Diagnosis not present

## 2016-10-25 DIAGNOSIS — G629 Polyneuropathy, unspecified: Secondary | ICD-10-CM | POA: Diagnosis not present

## 2016-10-25 DIAGNOSIS — E2740 Unspecified adrenocortical insufficiency: Secondary | ICD-10-CM | POA: Diagnosis not present

## 2016-11-02 ENCOUNTER — Encounter: Payer: Self-pay | Admitting: Vascular Surgery

## 2016-11-02 ENCOUNTER — Ambulatory Visit (INDEPENDENT_AMBULATORY_CARE_PROVIDER_SITE_OTHER): Payer: Medicare HMO | Admitting: Vascular Surgery

## 2016-11-02 VITALS — BP 169/65 | HR 75 | Temp 98.1°F | Ht 67.0 in | Wt 217.0 lb

## 2016-11-02 DIAGNOSIS — I739 Peripheral vascular disease, unspecified: Secondary | ICD-10-CM

## 2016-11-02 NOTE — Progress Notes (Signed)
History of Present Illness:  Patient is a 66 y.o. year old male who presents for follow up evaluation of claudication. He was last seen 07/27/2016 with SFA stenosis.  He has started a walking program, and states he can walk further than before.  He has tried to cut down on his tobacco use.   There is not rest pain.  There is no history of ulcerations on the feet.  Atherosclerotic risk factors and other medical problems include Tobacco abuse, HTN, and DM.  He reports no change in his health or medications since his last visit.  Past Medical History:  Diagnosis Date  . Abnormal cardiovascular stress test 03/2013  . Back pain   . CHF (congestive heart failure) ([place])   . Coronary artery disease    Multivessel with significant LAD involvement by chest CT 2014  . Diastolic dysfunction   . Essential hypertension   . Lumbar herniated disc    L4-L5  . MRSA (methicillin resistant staph aureus) culture positive   . Shortness of breath dyspnea   . Sleep apnea   . Type 2 diabetes mellitus ([place])     Past Surgical History:  Procedure Laterality Date  . ANTERIOR CERVICAL DECOMP/DISCECTOMY FUSION  01/18/2011   Procedure: ANTERIOR CERVICAL DECOMPRESSION/DISCECTOMY FUSION 2 LEVELS;  Surgeon: Cooper Render Pool;  Location: [facility];  Service: Neurosurgery;  Laterality: N/A;  anterior cervical discectomy and fusion with allograft and plating, cervical five-six, cervical six-seven  . BIOPSY N/A 04/04/2015   Procedure: BIOPSY;  Surgeon: Rogene Houston, MD;  [facility];  Service: Endoscopy;  Laterality: N/A;  . Bone spur removed  1997   Left shoulder  . CARDIAC CATHETERIZATION N/A 08/08/2015   Procedure: Right/Left Heart Cath and Coronary Angiography;  Surgeon: Sherren Mocha, MD;  Location: [facility];  Service: Cardiovascular;  Laterality: N/A;  . CARPAL TUNNEL RELEASE Right   . ESOPHAGEAL DILATION N/A 04/04/2015   Procedure: ESOPHAGEAL DILATION;  Surgeon: Rogene Houston, MD;   [facility];  Service: Endoscopy;  Laterality: N/A;  . ESOPHAGOGASTRODUODENOSCOPY N/A 04/04/2015   Procedure: ESOPHAGOGASTRODUODENOSCOPY (EGD);  Surgeon: Rogene Houston, MD;  [facility];  Service: Endoscopy;  Laterality: N/A;  1240  . HEMORRHOID SURGERY      ROS:   General:  No weight loss, Fever, chills  HEENT: No recent headaches, no nasal bleeding, no visual changes, no sore throat  Neurologic: No dizziness, blackouts, seizures. No recent symptoms of stroke or mini- stroke. No recent episodes of slurred speech, or temporary blindness.  Cardiac: No recent episodes of chest pain/pressure, no shortness of breath at rest.  No shortness of breath with exertion.  Denies history of atrial fibrillation or irregular heartbeat  Vascular: No history of rest pain in feet.  No history of claudication.  No history of non-healing ulcer, No history of DVT   Pulmonary: No home oxygen, no productive cough, no hemoptysis,  No asthma or wheezing  Musculoskeletal:  [ ]  Arthritis, [x ] Low back pain,  [ ]  Joint pain  Hematologic:No history of hypercoagulable state.  No history of easy bleeding.  No history of anemia  Gastrointestinal: No hematochezia or melena,  No gastroesophageal reflux, no trouble swallowing  Urinary: [ ]  chronic Kidney disease, [ ]  on HD - [ ]  MWF or [ ]  TTHS, [ ]  Burning with urination, [ ]  Frequent urination, [ ]  Difficulty urinating;   Skin: No rashes  Psychological: No history  of anxiety,  No history of depression  Social History Social History  Substance Use Topics  . Smoking status: Current Every Day Smoker    Packs/day: 1.00    Years: 50.00    Types: Cigarettes  . Smokeless tobacco: Former Systems developer     Comment: smoking x 50 yrs  . Alcohol use No    Family History Family History  Problem Relation Age of Onset  . Lung cancer Mother   . Diabetes Father   . Hepatitis B Father   . Diabetes Sister   . Cushing syndrome Sister      Allergies  Allergies  Allergen Reactions  . Morphine And Related Nausea And Vomiting     Current Outpatient Prescriptions  Medication Sig Dispense Refill  . aspirin EC 81 MG tablet Take 325 mg by mouth daily.     Marland Kitchen atorvastatin (LIPITOR) 80 MG tablet Take 1 tablet (80 mg total) by mouth daily at 6 PM. 90 tablet 3  . CALCIUM PO Take 1 tablet by mouth daily.    . Cyanocobalamin (VITAMIN B 12 PO) Take 1 tablet by mouth daily.    . diphenhydrAMINE (BENADRYL) 25 MG tablet Take 100 mg by mouth at bedtime.    . Dulaglutide (TRULICITY) 1.5 XT/0.6YI SOPN Inject 0.5 mLs into the skin once a week.    . Flaxseed, Linseed, (FLAXSEED OIL PO) Take 1 tablet by mouth daily.     . furosemide (LASIX) 40 MG tablet TAKE 1 TABLET (40 MG TOTAL) BY MOUTH DAILY. 90 tablet 0  . gabapentin (NEURONTIN) 300 MG capsule Take 1 capsule (300 mg total) by mouth at bedtime. 30 capsule 0  . HYDROcodone-acetaminophen (NORCO) 10-325 MG per tablet Take 1-2 tablets by mouth every 6 (six) hours as needed for severe pain.     Marland Kitchen ibuprofen (ADVIL,MOTRIN) 200 MG tablet Take 800 mg by mouth daily as needed for moderate pain.    Marland Kitchen insulin glargine (LANTUS) 100 UNIT/ML injection Inject 100 Units into the skin at bedtime.    Marland Kitchen lisinopril (PRINIVIL,ZESTRIL) 2.5 MG tablet TAKE 1 TABLET (2.5 MG TOTAL) BY MOUTH DAILY. 90 tablet 2  . MAGNESIUM PO Take 1 tablet by mouth daily.    . metFORMIN (GLUCOPHAGE) 500 MG tablet Take 1 tablet (500 mg total) by mouth 2 (two) times daily.    . metoprolol succinate (TOPROL-XL) 25 MG 24 hr tablet TAKE 1 TABLET BY MOUTH EVERY DAY 30 tablet 11  . Multiple Vitamin (MULTIVITAMIN WITH MINERALS) TABS Take 1 tablet by mouth daily.    . nitroGLYCERIN (NITROSTAT) 0.4 MG SL tablet Place 1 tablet (0.4 mg total) under the tongue every 5 (five) minutes as needed for chest pain. 25 tablet 4  . POTASSIUM PO Take 1 tablet by mouth daily.    Marland Kitchen testosterone cypionate (DEPOTESTOTERONE CYPIONATE) 100 MG/ML injection  Inject 100 mg into the muscle every 28 (twenty-eight) days. For IM use only    . traMADol (ULTRAM) 50 MG tablet Take 50 mg by mouth 3 (three) times daily as needed for moderate pain.     No current facility-administered medications for this visit.     Physical Examination  Vitals:   11/02/16 1301  BP: (!) 169/65  Pulse: 75  Temp: 98.1 F (36.7 C)  TempSrc: Oral  SpO2: 97%  Weight: 217 lb (98.4 kg)  Height: 5\' 7"  (1.702 m)    Body mass index is 33.99 kg/m.  General:  Alert and oriented, no acute distress HEENT: Normal Neck: No bruit  or JVD Pulmonary: Clear to auscultation bilaterally Cardiac: Regular Rate and Rhythm positive murmur since childhood asymptomatic. Abdomen: Soft, non-tender, non-distended, no mass, no scars Skin: No rash Extremity Pulses:  2+ radial, brachial, femoral, non palpable dorsalis pedis, posterior tibial pulses bilaterally.  Feet are warm to touch and sensation is intact. Musculoskeletal: No deformity or edema  Neurologic: Upper and lower extremity motor 5/5 and symmetric     ASSESSMENT:  Claudication PAD with B SFA stenosis   PLAN:  He is doing well over all.  He has started walking more and his daily job is active.  He states he will continue to try and quit smoking well.  We reviewed the signs and symptoms of ischemic changes to include a non healing wound or rest pain that wakes him at night.  If he has a change in his symptoms he will call our office other wise he will f/u PRN.  Continue maximum medical management with daily Aspirin, betablocker, Lipitor and DM control.     Theda Sers, Finnley Lewis MAUREEN PA-C [facility]  The patient was seen today in conjunction with Dr. Donnetta Hutching  I have examined the patient, reviewed and agree with above.Continue to work on smoking cessation. Able to tolerate his current claudication. No tissue loss or rest pain. Will notify should he develop progressive ischemic symptoms  Curt Jews, MD 11/02/2016 2:26 PM

## 2016-11-05 DIAGNOSIS — H25813 Combined forms of age-related cataract, bilateral: Secondary | ICD-10-CM | POA: Diagnosis not present

## 2016-11-05 DIAGNOSIS — E113393 Type 2 diabetes mellitus with moderate nonproliferative diabetic retinopathy without macular edema, bilateral: Secondary | ICD-10-CM | POA: Diagnosis not present

## 2016-11-05 DIAGNOSIS — Z7984 Long term (current) use of oral hypoglycemic drugs: Secondary | ICD-10-CM | POA: Diagnosis not present

## 2016-11-05 DIAGNOSIS — E1165 Type 2 diabetes mellitus with hyperglycemia: Secondary | ICD-10-CM | POA: Diagnosis not present

## 2016-11-05 LAB — HM DIABETES EYE EXAM

## 2016-11-11 ENCOUNTER — Other Ambulatory Visit: Payer: Self-pay | Admitting: Adult Health

## 2016-11-29 DIAGNOSIS — H35033 Hypertensive retinopathy, bilateral: Secondary | ICD-10-CM | POA: Diagnosis not present

## 2016-11-29 DIAGNOSIS — H43813 Vitreous degeneration, bilateral: Secondary | ICD-10-CM | POA: Diagnosis not present

## 2016-11-29 DIAGNOSIS — E113393 Type 2 diabetes mellitus with moderate nonproliferative diabetic retinopathy without macular edema, bilateral: Secondary | ICD-10-CM | POA: Diagnosis not present

## 2016-11-29 DIAGNOSIS — H353132 Nonexudative age-related macular degeneration, bilateral, intermediate dry stage: Secondary | ICD-10-CM | POA: Diagnosis not present

## 2016-12-09 DIAGNOSIS — I10 Essential (primary) hypertension: Secondary | ICD-10-CM | POA: Diagnosis not present

## 2016-12-09 DIAGNOSIS — E2740 Unspecified adrenocortical insufficiency: Secondary | ICD-10-CM | POA: Diagnosis not present

## 2016-12-09 DIAGNOSIS — E109 Type 1 diabetes mellitus without complications: Secondary | ICD-10-CM | POA: Diagnosis not present

## 2016-12-09 DIAGNOSIS — E782 Mixed hyperlipidemia: Secondary | ICD-10-CM | POA: Diagnosis not present

## 2017-01-05 DIAGNOSIS — R69 Illness, unspecified: Secondary | ICD-10-CM | POA: Diagnosis not present

## 2017-01-19 ENCOUNTER — Other Ambulatory Visit (HOSPITAL_COMMUNITY): Payer: Self-pay | Admitting: Family Medicine

## 2017-01-19 DIAGNOSIS — K829 Disease of gallbladder, unspecified: Secondary | ICD-10-CM

## 2017-01-19 DIAGNOSIS — R1011 Right upper quadrant pain: Secondary | ICD-10-CM

## 2017-01-19 DIAGNOSIS — E109 Type 1 diabetes mellitus without complications: Secondary | ICD-10-CM | POA: Diagnosis not present

## 2017-01-19 DIAGNOSIS — R82998 Other abnormal findings in urine: Secondary | ICD-10-CM | POA: Diagnosis not present

## 2017-01-24 ENCOUNTER — Encounter (HOSPITAL_COMMUNITY): Payer: Self-pay

## 2017-01-24 ENCOUNTER — Ambulatory Visit (HOSPITAL_COMMUNITY)
Admission: RE | Admit: 2017-01-24 | Discharge: 2017-01-24 | Disposition: A | Discharge status: Home / Self Care | Payer: Medicare HMO | Source: Ambulatory Visit | Attending: Family Medicine | Admitting: Family Medicine

## 2017-02-28 DIAGNOSIS — H52222 Regular astigmatism, left eye: Secondary | ICD-10-CM | POA: Diagnosis not present

## 2017-02-28 DIAGNOSIS — H25813 Combined forms of age-related cataract, bilateral: Secondary | ICD-10-CM | POA: Diagnosis not present

## 2017-02-28 DIAGNOSIS — E113293 Type 2 diabetes mellitus with mild nonproliferative diabetic retinopathy without macular edema, bilateral: Secondary | ICD-10-CM | POA: Diagnosis not present

## 2017-03-28 ENCOUNTER — Other Ambulatory Visit: Payer: Self-pay | Admitting: Cardiology

## 2017-04-22 ENCOUNTER — Encounter (HOSPITAL_COMMUNITY): Payer: Self-pay | Admitting: Emergency Medicine

## 2017-04-22 ENCOUNTER — Other Ambulatory Visit: Payer: Self-pay

## 2017-04-22 ENCOUNTER — Emergency Department (HOSPITAL_COMMUNITY)
Admission: EM | Admit: 2017-04-22 | Discharge: 2017-04-22 | Disposition: A | Discharge status: Home / Self Care | Payer: Medicare HMO | Attending: Emergency Medicine | Admitting: Emergency Medicine

## 2017-04-22 ENCOUNTER — Emergency Department (HOSPITAL_COMMUNITY): Payer: Medicare HMO

## 2017-04-22 DIAGNOSIS — I110 Hypertensive heart disease with heart failure: Secondary | ICD-10-CM | POA: Insufficient documentation

## 2017-04-22 DIAGNOSIS — M545 Low back pain: Secondary | ICD-10-CM | POA: Diagnosis not present

## 2017-04-22 DIAGNOSIS — M533 Sacrococcygeal disorders, not elsewhere classified: Secondary | ICD-10-CM | POA: Diagnosis not present

## 2017-04-22 DIAGNOSIS — I5031 Acute diastolic (congestive) heart failure: Secondary | ICD-10-CM | POA: Insufficient documentation

## 2017-04-22 DIAGNOSIS — I2510 Atherosclerotic heart disease of native coronary artery without angina pectoris: Secondary | ICD-10-CM | POA: Diagnosis not present

## 2017-04-22 DIAGNOSIS — E119 Type 2 diabetes mellitus without complications: Secondary | ICD-10-CM | POA: Diagnosis not present

## 2017-04-22 DIAGNOSIS — R69 Illness, unspecified: Secondary | ICD-10-CM | POA: Diagnosis not present

## 2017-04-22 DIAGNOSIS — F17210 Nicotine dependence, cigarettes, uncomplicated: Secondary | ICD-10-CM | POA: Diagnosis not present

## 2017-04-22 MED ORDER — TRAMADOL HCL 50 MG PO TABS: 50.0000 mg | ORAL_TABLET | Freq: Four times a day (QID) | ORAL | 0 refills | Status: DC | PRN

## 2017-04-22 MED ORDER — KETOROLAC TROMETHAMINE 30 MG/ML IJ SOLN
30.0000 mg | Freq: Once | INTRAMUSCULAR | Status: AC
  Administered 2017-04-22: 30 mg via INTRAMUSCULAR
  Filled 2017-04-22: qty 1

## 2017-04-22 NOTE — ED Provider Notes (Signed)
Emergency Department Provider Note   I have reviewed the triage vital signs and the nursing notes.   HISTORY  Chief Complaint Tailbone Pain   HPI Jonathan Gilmore is a 67 y.o. male with PMH of CAD, CHF, HTN, DM, and OSA presents to the emergency department for evaluation of tailbone pain for the past 2 days.  The patient states he was splitting logs when he sat down hard on the edge of the log.  Since that time is had severe pain in his tailbone region.  Denies any numbness or tingling.  No blood in his bowel movements.  He has pain with sitting and laying flat.  He often is to the lean on his sides.  He has no pain with ambulation.  He is tried over-the-counter medications but has experienced little relief from that.   Past Medical History:  Diagnosis Date  . Abnormal cardiovascular stress test 03/2013  . Back pain   . CHF (congestive heart failure) ([place])   . Coronary artery disease    Multivessel with significant LAD involvement by chest CT 2014  . Diastolic dysfunction   . Essential hypertension   . Lumbar herniated disc    L4-L5  . MRSA (methicillin resistant staph aureus) culture positive   . Shortness of breath dyspnea   . Sleep apnea   . Type 2 diabetes mellitus [facility])     Patient Active Problem List   Diagnosis Date Noted  . CAD -50% LAD  08/09/2015  . Moderate aortic stenosis 08/09/2015  . Acute pulmonary edema ([place]) 08/07/2015  . Acute diastolic CHF (congestive heart failure) ([place]) 08/07/2015  . Hypersomnia with sleep apnea, unspecified 09/20/2013  . Syncope 09/19/2013  . 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  . OSA (obstructive sleep apnea) -non compliant 09/19/2013  . Narcolepsy and cataplexy 09/19/2013  . Facial abscess 04/28/2013  . Facial cellulitis 04/28/2013  . Hyperlipidemia 04/28/2013  . Tobacco dependence 04/28/2013  . Sepsis ([place]) 04/28/2013  . Gout 04/28/2013  . Diastolic CHF ([place]) 58/52/7782  . Pleuritic pain 02/24/2013  . Chest pain  02/24/2013  . Murmur 02/24/2013  . Pericarditis 02/24/2013  . DM type 2 (diabetes mellitus, type 2) ([place]) 02/24/2013  . HTN (hypertension) 02/24/2013  . Cervical spondylosis with myelopathy 01/19/2011    Past Surgical History:  Procedure Laterality Date  . ANTERIOR CERVICAL DECOMP/DISCECTOMY FUSION  01/18/2011   Procedure: ANTERIOR CERVICAL DECOMPRESSION/DISCECTOMY FUSION 2 LEVELS;  Surgeon: Cooper Render Pool;  Location: [facility];  Service: Neurosurgery;  Laterality: N/A;  anterior cervical discectomy and fusion with allograft and plating, cervical five-six, cervical six-seven  . BIOPSY N/A 04/04/2015   Procedure: BIOPSY;  Surgeon: Rogene Houston, MD;  [facility];  Service: Endoscopy;  Laterality: N/A;  . Bone spur removed  1997   Left shoulder  . CARDIAC CATHETERIZATION N/A 08/08/2015   Procedure: Right/Left Heart Cath and Coronary Angiography;  Surgeon: Sherren Mocha, MD;  Location: [facility];  Service: Cardiovascular;  Laterality: N/A;  . CARPAL TUNNEL RELEASE Right   . ESOPHAGEAL DILATION N/A 04/04/2015   Procedure: ESOPHAGEAL DILATION;  Surgeon: Rogene Houston, MD;  [facility];  Service: Endoscopy;  Laterality: N/A;  . ESOPHAGOGASTRODUODENOSCOPY N/A 04/04/2015   Procedure: ESOPHAGOGASTRODUODENOSCOPY (EGD);  Surgeon: Rogene Houston, MD;  [facility];  Service: Endoscopy;  Laterality: N/A;  1240  . HEMORRHOID SURGERY      Current Outpatient Rx  . Order #: [id number] Class: Historical Med  .  Order #: [id number] Class: Normal  . Order #: [id number] Class: Historical Med  . Order #: [id number] Class: Historical Med  . Order #: [id number] Class: Historical Med  . Order #: [id number] Class: Historical Med  . Order #: [id number] Class: Historical Med  . Order #: [id number] Class: Normal  . Order #: [id number] Class: Normal  . Order #: [id number] Class: Historical Med  . Order #: [id number] Class: Historical Med  . Order #: [id number] Class: Historical Med  .  Order #: [id number] Class: Normal  . Order #: [id number] Class: Historical Med  . Order #: [id number] Class: No Print  . Order #: [id number] Class: Normal  . Order #: [id number] Class: Historical Med  . Order #: [id number] Class: Normal  . Order #: [id number] Class: Historical Med  . Order #: [id number] Class: Historical Med  . Order #: [id number] Class: Print    Allergies Morphine and related  Family History  Problem Relation Age of Onset  . Lung cancer Mother   . Diabetes Father   . Hepatitis B Father   . Diabetes Sister   . Cushing syndrome Sister     Social History Social History   Tobacco Use  . Smoking status: Current Every Day Smoker    Packs/day: 1.00    Years: 50.00    Pack years: 50.00    Types: Cigarettes  . Smokeless tobacco: Former Systems developer  . Tobacco comment: smoking x 50 yrs  Substance Use Topics  . Alcohol use: No    Alcohol/week: 0.0 oz  . Drug use: No    Review of Systems  Constitutional: No fever/chills Eyes: No visual changes. ENT: No sore throat. Cardiovascular: Denies chest pain. Respiratory: Denies shortness of breath. Gastrointestinal: No abdominal pain.  No nausea, no vomiting.  No diarrhea.  No constipation. Genitourinary: Negative for dysuria. Musculoskeletal: Negative for back pain. Positive tailbone pain.  Skin: Negative for rash. Neurological: Negative for headaches, focal weakness or numbness.  10-point ROS otherwise negative.  ____________________________________________   PHYSICAL EXAM:  VITAL SIGNS: ED Triage Vitals  Enc Vitals Group     BP 04/22/17 0711 (!) 161/73     Pulse Rate 04/22/17 0711 68     Resp 04/22/17 0711 18     Temp 04/22/17 0711 98.5 F (36.9 C)     Temp Source 04/22/17 0711 Oral     SpO2 04/22/17 0711 97 %     Weight 04/22/17 0704 220 lb (99.8 kg)     Height 04/22/17 0704 5\' 7"  (1.702 m)    Constitutional: Alert and oriented. Well appearing and in no acute distress. Eyes: Conjunctivae are normal.  Head:  Atraumatic. Nose: No congestion/rhinnorhea. Mouth/Throat: Mucous membranes are moist.  Neck: No stridor. Cardiovascular: Good peripheral circulation. Respiratory: Normal respiratory effort.   Gastrointestinal: No distention.  Musculoskeletal: No lower extremity tenderness nor edema. No gross deformities of extremities. No bruising over the lower sacrum. No abrasions/lacerations. Point tenderness over the coccyx.  Neurologic:  Normal speech and language. No gross focal neurologic deficits are appreciated.  Skin:  Skin is warm, dry and intact. No rash noted.  ____________________________________________  [id number]  Dg Sacrum/coccyx  Result Date: 04/22/2017 CLINICAL DATA:  Recent fall with buttock pain, initial encounter EXAM: SACRUM AND COCCYX - 2+ VIEW COMPARISON:  04/29/2005 FINDINGS: Pelvic ring is intact. The sacral ala are within normal limits. No acute fracture is noted. Posterior displacement of the coccyx with respect to the sacrum is seen although chronic when compare with the prior exam. IMPRESSION: No acute abnormality noted Electronically Signed   By: Inez Catalina M.D.   On: 04/22/2017  08:08    ____________________________________________   PROCEDURES  Procedure(s) performed:   Procedures  None ____________________________________________   INITIAL IMPRESSION / ASSESSMENT AND PLAN / ED COURSE  Pertinent labs & imaging results that were available during my care of the patient were reviewed by me and considered in my medical decision making (see chart for details).  Patient presents to the emergency department for evaluation of pain in the lower sacrum/coccyx area after sitting on the edge of a log.  Is no evidence of skin breakdown or bruising in the area although is point tender.  Suspect a coccyx fracture is to blame.  No evidence to suggest bowel involvement or injury.  Plan for plain film and will treat patient's pain with Toradol here in the emergency  department.  08:15 AM No fractures seen on plain film.  Discussed pain management at home with tramadol given as a last resort.  Advised not to drive while taking his medication.  He will follow with the primary care physician.  Discussed return precautions in detail.  At this time, I do not feel there is any life-threatening condition present. I have reviewed and discussed all results (EKG, imaging, lab, urine as appropriate), exam findings with patient. I have reviewed nursing notes and appropriate previous records.  I feel the patient is safe to be discharged home without further emergent workup. Discussed usual and customary return precautions. Patient and family (if present) verbalize understanding and are comfortable with this plan.  Patient will follow-up with their primary care provider. If they do not have a primary care provider, information for follow-up has been provided to them. All questions have been answered.  ____________________________________________  FINAL CLINICAL IMPRESSION(S) / ED DIAGNOSES  Final diagnoses:  Sacral pain     MEDICATIONS GIVEN DURING THIS VISIT:  Medications  ketorolac (TORADOL) 30 MG/ML injection 30 mg (30 mg Intramuscular Given 04/22/17 0736)     NEW OUTPATIENT MEDICATIONS STARTED DURING THIS VISIT:  New Prescriptions   TRAMADOL (ULTRAM) 50 MG TABLET    Take 1 tablet (50 mg total) by mouth every 6 (six) hours as needed.    Note:  This document was prepared using Dragon voice recognition software and may include unintentional dictation errors.  Nanda Quinton, MD Emergency Medicine    Samaiyah Howes, [facility], MD 04/22/17 [phone]

## 2017-04-22 NOTE — Discharge Instructions (Signed)
You were seen in the emergency department for evaluation of tailbone injury.  We did not see a fracture but you clearly have at least a bruise of this area and may have a small fracture not identified on x-ray.  We treat these the same way which includes pain medication and time for healing.  I would advise taking Tylenol and Motrin primarily for pain.  You can apply a cold compress to the area if that improves her symptoms.  I have prescribed a small amount of tramadol which she should take only for severe pain.  Do not drive while taking this medication.  If your pain improves and you have extra tramadol left you should throw it away.   Return to the emergency department with any new or worsening symptoms, blood in the stool, fevers/chills, or other concerning symptoms for you.

## 2017-04-22 NOTE — ED Triage Notes (Signed)
Pt states he sat down on a log a couple days causing pain.  No LOC, denies hitting head.

## 2017-04-28 DIAGNOSIS — I739 Peripheral vascular disease, unspecified: Secondary | ICD-10-CM | POA: Diagnosis not present

## 2017-04-28 DIAGNOSIS — I10 Essential (primary) hypertension: Secondary | ICD-10-CM | POA: Diagnosis not present

## 2017-04-28 DIAGNOSIS — Z6833 Body mass index (BMI) 33.0-33.9, adult: Secondary | ICD-10-CM | POA: Diagnosis not present

## 2017-04-28 DIAGNOSIS — E2740 Unspecified adrenocortical insufficiency: Secondary | ICD-10-CM | POA: Diagnosis not present

## 2017-04-28 DIAGNOSIS — E109 Type 1 diabetes mellitus without complications: Secondary | ICD-10-CM | POA: Diagnosis not present

## 2017-04-28 DIAGNOSIS — G629 Polyneuropathy, unspecified: Secondary | ICD-10-CM | POA: Diagnosis not present

## 2017-04-28 DIAGNOSIS — E782 Mixed hyperlipidemia: Secondary | ICD-10-CM | POA: Diagnosis not present

## 2017-04-28 DIAGNOSIS — R69 Illness, unspecified: Secondary | ICD-10-CM | POA: Diagnosis not present

## 2017-04-28 DIAGNOSIS — M5136 Other intervertebral disc degeneration, lumbar region: Secondary | ICD-10-CM | POA: Diagnosis not present

## 2017-04-28 DIAGNOSIS — Z7984 Long term (current) use of oral hypoglycemic drugs: Secondary | ICD-10-CM | POA: Diagnosis not present

## 2017-04-29 ENCOUNTER — Other Ambulatory Visit: Payer: Self-pay

## 2017-04-29 DIAGNOSIS — I739 Peripheral vascular disease, unspecified: Secondary | ICD-10-CM

## 2017-05-10 ENCOUNTER — Other Ambulatory Visit: Payer: Self-pay | Admitting: Cardiology

## 2017-05-25 ENCOUNTER — Other Ambulatory Visit: Payer: Self-pay

## 2017-05-25 MED ORDER — LISINOPRIL 2.5 MG PO TABS: 2.5000 mg | ORAL_TABLET | Freq: Every day | ORAL | 0 refills | Status: DC

## 2017-06-09 ENCOUNTER — Other Ambulatory Visit: Payer: Self-pay | Admitting: *Deleted

## 2017-06-09 ENCOUNTER — Telehealth: Payer: Self-pay | Admitting: Cardiology

## 2017-06-09 MED ORDER — LISINOPRIL 2.5 MG PO TABS: 2.5000 mg | ORAL_TABLET | Freq: Every day | ORAL | 0 refills | Status: DC

## 2017-06-09 NOTE — Telephone Encounter (Signed)
Numerous attempts to contact patient with recall letters. Unable to reach by telephone. with no success.  User: Time: Status:    Jonathan Gilmore [[id number]] 12/12/2015 9:46 AM New [10]   [System] 02/13/2016 11:04 PM Notification Sent [20]   Jonathan Gilmore [[id number]] 10/06/2016 1:31 PM Notification Sent [20]   Jonathan Gilmore [[id number]] 05/04/2017 9:24 AM Notification Sent [20]   Jonathan Gilmore [[id number]] 06/08/2017 10:40 AM Notification Sent [20]

## 2017-06-14 ENCOUNTER — Ambulatory Visit: Payer: Medicare HMO | Admitting: Vascular Surgery

## 2017-06-14 ENCOUNTER — Encounter: Payer: Self-pay | Admitting: Vascular Surgery

## 2017-06-14 ENCOUNTER — Ambulatory Visit (HOSPITAL_COMMUNITY)
Admission: RE | Admit: 2017-06-14 | Discharge: 2017-06-14 | Disposition: A | Discharge status: Home / Self Care | Payer: Medicare HMO | Source: Ambulatory Visit | Attending: Vascular Surgery | Admitting: Vascular Surgery

## 2017-06-14 VITALS — BP 139/70 | HR 83 | Temp 98.1°F | Resp 16 | Ht 66.0 in | Wt 211.0 lb

## 2017-06-14 DIAGNOSIS — I739 Peripheral vascular disease, unspecified: Secondary | ICD-10-CM | POA: Diagnosis not present

## 2017-06-14 DIAGNOSIS — I70203 Unspecified atherosclerosis of native arteries of extremities, bilateral legs: Secondary | ICD-10-CM | POA: Diagnosis not present

## 2017-06-14 NOTE — Progress Notes (Signed)
Vascular and Vein Specialist of [facility]  Patient name: Jonathan Gilmore MRN: [id number] DOB: [birth year] Sex: male  REASON FOR VISIT: Discuss intermittent claudication  HPI: Jonathan Gilmore is a 67 y.o. male here today for his wife for continued discussion of intermittent claudication symptoms.  He reports that he does a great deal of walking at work and has bilateral calf claudication.  This is equal right and left.  He reports occurs at about 1/4 mile walking.  He is able to do his work related activities.  Fortunately he has had no ischemic rest pain and has had no tissue loss.  He is currently stable from his other medical conditions including coronary artery disease.  Past Medical History:  Diagnosis Date  . Abnormal cardiovascular stress test 03/2013  . Back pain   . CHF (congestive heart failure) ([place])   . Coronary artery disease    Multivessel with significant LAD involvement by chest CT 2014  . Diastolic dysfunction   . Essential hypertension   . Lumbar herniated disc    L4-L5  . MRSA (methicillin resistant staph aureus) culture positive   . Shortness of breath dyspnea   . Sleep apnea   . Type 2 diabetes mellitus (HCC)     Family History  Problem Relation Age of Onset  . Lung cancer Mother   . Diabetes Father   . Hepatitis B Father   . Diabetes Sister   . Cushing syndrome Sister     SOCIAL HISTORY: Social History   Tobacco Use  . Smoking status: Current Every Day Smoker    Packs/day: 1.00    Years: 50.00    Pack years: 50.00    Types: Cigarettes  . Smokeless tobacco: Former Systems developer  . Tobacco comment: smoking x 50 yrs  Substance Use Topics  . Alcohol use: No    Alcohol/week: 0.0 oz    Allergies  Allergen Reactions  . Morphine And Related Nausea And Vomiting    Current Outpatient Medications  Medication Sig Dispense Refill  . aspirin EC 81 MG tablet Take 325 mg by mouth daily.     Marland Kitchen atorvastatin (LIPITOR) 80 MG tablet  Take 1 tablet (80 mg total) by mouth daily at 6 PM. 90 tablet 3  . CALCIUM PO Take 1 tablet by mouth daily.    . diphenhydrAMINE (BENADRYL) 25 MG tablet Take 100 mg by mouth at bedtime.    . Dulaglutide (TRULICITY) 1.5 FI/4.3PI SOPN Inject 0.5 mLs into the skin once a week.    . Flaxseed, Linseed, (FLAXSEED OIL PO) Take 1 tablet by mouth daily.     . furosemide (LASIX) 40 MG tablet TAKE 1 TABLET BY MOUTH EVERY DAY 90 tablet 3  . gabapentin (NEURONTIN) 300 MG capsule Take 1 capsule (300 mg total) by mouth at bedtime. 30 capsule 0  . HYDROcodone-acetaminophen (NORCO) 10-325 MG per tablet Take 1-2 tablets by mouth every 6 (six) hours as needed for severe pain.     Marland Kitchen ibuprofen (ADVIL,MOTRIN) 200 MG tablet Take 800 mg by mouth daily as needed for moderate pain.    Marland Kitchen insulin glargine (LANTUS) 100 UNIT/ML injection Inject 100 Units into the skin at bedtime.    Marland Kitchen lisinopril (PRINIVIL,ZESTRIL) 2.5 MG tablet Take 1 tablet (2.5 mg total) by mouth daily. 15 tablet 0  . MAGNESIUM PO Take 1 tablet by mouth daily.    . metFORMIN (GLUCOPHAGE) 500 MG tablet Take 1 tablet (500 mg total) by mouth 2 (two) times daily.    Marland Kitchen  metoprolol succinate (TOPROL-XL) 25 MG 24 hr tablet TAKE 1 TABLET BY MOUTH EVERY DAY 30 tablet 11  . Multiple Vitamin (MULTIVITAMIN WITH MINERALS) TABS Take 1 tablet by mouth daily.    . nitroGLYCERIN (NITROSTAT) 0.4 MG SL tablet Place 1 tablet (0.4 mg total) under the tongue every 5 (five) minutes as needed for chest pain. 25 tablet 4  . POTASSIUM PO Take 1 tablet by mouth daily.    Marland Kitchen testosterone cypionate (DEPOTESTOTERONE CYPIONATE) 100 MG/ML injection Inject 100 mg into the muscle every 28 (twenty-eight) days. For IM use only    . Cyanocobalamin (VITAMIN B 12 PO) Take 1 tablet by mouth daily.    . traMADol (ULTRAM) 50 MG tablet Take 1 tablet (50 mg total) by mouth every 6 (six) hours as needed. (Patient not taking: Reported on 06/14/2017) 10 tablet 0   No current facility-administered  medications for this visit.     REVIEW OF SYSTEMS:  [X]  denotes positive finding, [ ]  denotes negative finding Cardiac  Comments:  Chest pain or chest pressure:    Shortness of breath upon exertion: x   Short of breath when lying flat:    Irregular heart rhythm:        Vascular    Pain in calf, thigh, or hip brought on by ambulation: x   Pain in feet at night that wakes you up from your sleep:     Blood clot in your veins:    Leg swelling:           PHYSICAL EXAM: Vitals:   06/14/17 1207 06/14/17 1208  BP: (!) 156/81 139/70  Pulse: 83   Resp: 16   Temp: 98.1 F (36.7 C)   SpO2: 97%   Weight: 211 lb (95.7 kg)   Height: 5\' 6"  (1.676 m)     GENERAL: The patient is a well-nourished male, in no acute distress. The vital signs are documented above. CARDIOVASCULAR: Rotted arteries without bruits bilaterally.  2+ radial and 2+ femoral pulses.  Absent popliteal and distal pulses PULMONARY: There is good air exchange  MUSCULOSKELETAL: There are no major deformities or cyanosis. NEUROLOGIC: No focal weakness or paresthesias are detected. SKIN: There are no ulcers or rashes noted. PSYCHIATRIC: The patient has a normal affect.  DATA:  Noninvasive arterial duplex study was reviewed with the patient.  This shows normal flow to the level of the common femoral artery with monophasic flow in his superficial femoral artery and distally bilaterally.  MEDICAL ISSUES: I long discussion with the patient and his wife.  I explained the importance of his continued walking program.  Since my last visit with him he reports that he continues to smoke cigarettes but does not inhale.  He reports that he just has this to keep something in his hand.  I explained to him the critical importance of absolute smoking cessation for improvement of his walking and reduction of risk for progression.  I again explained symptoms of critical limb ischemia to him and he would notify should this occur.  He currently is  comfortable with his level of walking ability.  Will see [place] again on an as-needed basis    Rosetta Posner, MD [facility] and Vein Specialists of [facility] Tel [phone] Pager [phone]

## 2017-06-16 ENCOUNTER — Other Ambulatory Visit: Payer: Self-pay | Admitting: Cardiology

## 2017-06-18 ENCOUNTER — Other Ambulatory Visit: Payer: Self-pay | Admitting: Cardiology

## 2017-07-21 ENCOUNTER — Other Ambulatory Visit: Payer: Self-pay | Admitting: Cardiology

## 2017-07-25 ENCOUNTER — Other Ambulatory Visit: Payer: Self-pay | Admitting: Cardiology

## 2017-08-11 DIAGNOSIS — E109 Type 1 diabetes mellitus without complications: Secondary | ICD-10-CM | POA: Diagnosis not present

## 2017-08-11 DIAGNOSIS — I10 Essential (primary) hypertension: Secondary | ICD-10-CM | POA: Diagnosis not present

## 2017-08-11 DIAGNOSIS — Z125 Encounter for screening for malignant neoplasm of prostate: Secondary | ICD-10-CM | POA: Diagnosis not present

## 2017-08-11 DIAGNOSIS — M5136 Other intervertebral disc degeneration, lumbar region: Secondary | ICD-10-CM | POA: Diagnosis not present

## 2017-08-11 DIAGNOSIS — G629 Polyneuropathy, unspecified: Secondary | ICD-10-CM | POA: Diagnosis not present

## 2017-08-14 ENCOUNTER — Other Ambulatory Visit: Payer: Self-pay | Admitting: Cardiology

## 2017-08-15 ENCOUNTER — Ambulatory Visit: Payer: Medicare HMO | Admitting: Cardiology

## 2017-08-15 ENCOUNTER — Encounter: Payer: Self-pay | Admitting: *Deleted

## 2017-08-15 ENCOUNTER — Encounter: Payer: Self-pay | Admitting: Cardiology

## 2017-08-15 VITALS — BP 142/78 | HR 94 | Ht 67.0 in | Wt 212.0 lb

## 2017-08-15 DIAGNOSIS — I2510 Atherosclerotic heart disease of native coronary artery without angina pectoris: Secondary | ICD-10-CM | POA: Diagnosis not present

## 2017-08-15 DIAGNOSIS — G4733 Obstructive sleep apnea (adult) (pediatric): Secondary | ICD-10-CM | POA: Diagnosis not present

## 2017-08-15 DIAGNOSIS — I350 Nonrheumatic aortic (valve) stenosis: Secondary | ICD-10-CM | POA: Diagnosis not present

## 2017-08-15 DIAGNOSIS — I10 Essential (primary) hypertension: Secondary | ICD-10-CM

## 2017-08-15 MED ORDER — LISINOPRIL 5 MG PO TABS: 5.0000 mg | ORAL_TABLET | Freq: Every day | ORAL | 3 refills | Status: DC

## 2017-08-15 NOTE — Progress Notes (Signed)
Clinical Summary Jonathan Gilmore is a 67 y.o.male  seen today for follow up of the following medical problems.   1. CAD - CT scan chest 02/2013 with 3 vessel CAD, severe LAD disease. - Jan 2015 nuclear stress without clear ischemia - admit 07/2015 with acute pulmonary edema in setting of severe HTN. Referred for cath.  - cath 07/2015 with 50% LAD disease, overall patent coronaries - his presentation with pulmonary edema likely secondary to severe HTN in setting of restrictive diastolic dysfunction. - echo 07/2015 LVEF 45-50%, restrictive diastolic dysfunction    - no recent chest pain. No SOB/DOE - compliant with meds. No recent edema.   2. Severe OSA - not using CPAP machine due to discomfort. Stopped several years ago - last visit referred to Dr Luan Pulling.He has appt coming up.   - did not see Dr Luan Pulling since our last visit  3. Hyperlipidemia - 07/2015 TC 135 TG 194 HDL 38 LDL 58 - compliant with statin  - labs recently with Dr Orlena Sheldon.    4. Aortic stenosis - echo 07/2015 mild to moderate. Mead gradient 16, AVA reported at 0.9 with dimensionless index 0.3 - denies any recent symptoms  5. HTN - compliant with meds  6. Chronic diastolic HF - echo 10/2991 with restrictive diastolic dysfunction. Low normal to mildly decreased LVEF at 45-50%.  - no recent edema, no SOB/DOE  7. DM2  - followed by pcp   8. Claudication/PAD - followed by vascular     SH: his wife, Jonathan Gilmore is also a patient of mine.     Past Medical History:  Diagnosis Date  . Abnormal cardiovascular stress test 03/2013  . Back pain   . CHF (congestive heart failure) ([place])   . Coronary artery disease    Multivessel with significant LAD involvement by chest CT 2014  . Diastolic dysfunction   . Essential hypertension   . Lumbar herniated disc    L4-L5  . MRSA (methicillin resistant staph aureus) culture positive   . Shortness of breath dyspnea   . Sleep apnea   . Type 2  diabetes mellitus (HCC)      Allergies  Allergen Reactions  . Morphine And Related Nausea And Vomiting     Current Outpatient Medications  Medication Sig Dispense Refill  . aspirin EC 81 MG tablet Take 325 mg by mouth daily.     Marland Kitchen atorvastatin (LIPITOR) 80 MG tablet TAKE 1 TABLET (80 MG TOTAL) BY MOUTH DAILY AT 6 PM. 90 tablet 2  . CALCIUM PO Take 1 tablet by mouth daily.    . Cyanocobalamin (VITAMIN B 12 PO) Take 1 tablet by mouth daily.    . diphenhydrAMINE (BENADRYL) 25 MG tablet Take 100 mg by mouth at bedtime.    . Dulaglutide (TRULICITY) 1.5 ZJ/6.9CV SOPN Inject 0.5 mLs into the skin once a week.    . Flaxseed, Linseed, (FLAXSEED OIL PO) Take 1 tablet by mouth daily.     . furosemide (LASIX) 40 MG tablet TAKE 1 TABLET BY MOUTH EVERY DAY 90 tablet 3  . gabapentin (NEURONTIN) 300 MG capsule Take 1 capsule (300 mg total) by mouth at bedtime. 30 capsule 0  . HYDROcodone-acetaminophen (NORCO) 10-325 MG per tablet Take 1-2 tablets by mouth every 6 (six) hours as needed for severe pain.     Marland Kitchen ibuprofen (ADVIL,MOTRIN) 200 MG tablet Take 800 mg by mouth daily as needed for moderate pain.    Marland Kitchen insulin glargine (LANTUS) 100 UNIT/ML  injection Inject 100 Units into the skin at bedtime.    Marland Kitchen lisinopril (PRINIVIL,ZESTRIL) 2.5 MG tablet Take 1 tablet (2.5 mg total) by mouth daily. 15 tablet 0  . lisinopril (PRINIVIL,ZESTRIL) 2.5 MG tablet TAKE 1 TABLET BY MOUTH EVERY DAY 7 tablet 0  . lisinopril (PRINIVIL,ZESTRIL) 2.5 MG tablet TAKE 1 TABLET BY MOUTH EVERY DAY 7 tablet 0  . MAGNESIUM PO Take 1 tablet by mouth daily.    . metFORMIN (GLUCOPHAGE) 500 MG tablet Take 1 tablet (500 mg total) by mouth 2 (two) times daily.    . metoprolol succinate (TOPROL-XL) 25 MG 24 hr tablet TAKE 1 TABLET BY MOUTH EVERY DAY 30 tablet 11  . Multiple Vitamin (MULTIVITAMIN WITH MINERALS) TABS Take 1 tablet by mouth daily.    . nitroGLYCERIN (NITROSTAT) 0.4 MG SL tablet Place 1 tablet (0.4 mg total) under the tongue  every 5 (five) minutes as needed for chest pain. 25 tablet 4  . POTASSIUM PO Take 1 tablet by mouth daily.    Marland Kitchen testosterone cypionate (DEPOTESTOTERONE CYPIONATE) 100 MG/ML injection Inject 100 mg into the muscle every 28 (twenty-eight) days. For IM use only    . traMADol (ULTRAM) 50 MG tablet Take 1 tablet (50 mg total) by mouth every 6 (six) hours as needed. (Patient not taking: Reported on 06/14/2017) 10 tablet 0   No current facility-administered medications for this visit.      Past Surgical History:  Procedure Laterality Date  . ANTERIOR CERVICAL DECOMP/DISCECTOMY FUSION  01/18/2011   Procedure: ANTERIOR CERVICAL DECOMPRESSION/DISCECTOMY FUSION 2 LEVELS;  Surgeon: Cooper Render Pool;  Location: [facility];  Service: Neurosurgery;  Laterality: N/A;  anterior cervical discectomy and fusion with allograft and plating, cervical five-six, cervical six-seven  . BIOPSY N/A 04/04/2015   Procedure: BIOPSY;  Surgeon: Rogene Houston, MD;  Location: [facility];  Service: Endoscopy;  Laterality: N/A;  . Bone spur removed  1997   Left shoulder  . CARDIAC CATHETERIZATION N/A 08/08/2015   Procedure: Right/Left Heart Cath and Coronary Angiography;  Surgeon: Sherren Mocha, MD;  Location: [facility];  Service: Cardiovascular;  Laterality: N/A;  . CARPAL TUNNEL RELEASE Right   . ESOPHAGEAL DILATION N/A 04/04/2015   Procedure: ESOPHAGEAL DILATION;  Surgeon: Rogene Houston, MD;  Location: [facility];  Service: Endoscopy;  Laterality: N/A;  . ESOPHAGOGASTRODUODENOSCOPY N/A 04/04/2015   Procedure: ESOPHAGOGASTRODUODENOSCOPY (EGD);  Surgeon: Rogene Houston, MD;  Location: [facility];  Service: Endoscopy;  Laterality: N/A;  1240  . HEMORRHOID SURGERY       Allergies  Allergen Reactions  . Morphine And Related Nausea And Vomiting      Family History  Problem Relation Age of Onset  . Lung cancer Mother   . Diabetes Father   . Hepatitis B Father   . Diabetes Sister   . Cushing syndrome  Sister      Social History Mr. Cinquemani reports that he has been smoking cigarettes.  He has a 50.00 pack-year smoking history. He has quit using smokeless tobacco. Mr. Hailes reports that he does not drink alcohol.   Review of Systems CONSTITUTIONAL: No weight loss, fever, chills, weakness or fatigue.  HEENT: Eyes: No visual loss, blurred vision, double vision or yellow sclerae.No hearing loss, sneezing, congestion, runny nose or sore throat.  SKIN: No rash or itching.  CARDIOVASCULAR: per hpi RESPIRATORY: No shortness of breath, cough or sputum.  GASTROINTESTINAL: No anorexia, nausea, vomiting or diarrhea. No abdominal pain or blood.  GENITOURINARY: No burning on urination, no polyuria NEUROLOGICAL: No headache, dizziness, syncope, paralysis, ataxia, numbness or tingling in the extremities. No change in bowel or bladder control.  MUSCULOSKELETAL: No muscle, back pain, joint pain or stiffness.  LYMPHATICS: No enlarged nodes. No history of splenectomy.  PSYCHIATRIC: No history of depression or anxiety.  ENDOCRINOLOGIC: No reports of sweating, cold or heat intolerance. No polyuria or polydipsia.  Marland Kitchen   Physical Examination Vitals:   08/15/17 1122  BP: (!) 142/78  Pulse: 94  SpO2: 95%   Vitals:   08/15/17 1122  Weight: 212 lb (96.2 kg)  Height: 5\' 7"  (1.702 m)    Gen: resting comfortably, no acute distress HEENT: no scleral icterus, pupils equal round and reactive, no palptable cervical adenopathy,  CV: RRR, 2/6 systolic murmur rusb, no jvd Resp: Clear to auscultation bilaterally GI: abdomen is soft, non-tender, non-distended, normal bowel sounds, no hepatosplenomegaly MSK: extremities are warm, no edema.  Skin: warm, no rash Neuro:  no focal deficits Psych: appropriate affect   Diagnostic Studies Jan 2015 MPI IMPRESSION: 1. Abnormal exercise myocardial perfusion imaging stress test  2. Inferior wall defect likely due to subdiaphragmatic attenuation, its wall motion is  similar to other areas without a perfusion defect.  3. Likely small anteroseptal infract, this area is more hypokinetic than the rest of the myocardium  3. Low left ventricular systolic function with global hypokinesis  4. Mildly reduced functional capacity (90% of age and gender predicted)  5. Overall increased risk for major cardiac events based on low ejection fraction. There is no current myocardium at jeopardy.   02/2013 echo Study Conclusions  - Left ventricle: The cavity size was normal. There was mild concentric hypertrophy. Systolic function was normal. The estimated ejection fraction was in the range of 55% to 60%. Wall motion was normal; there were no regional wall motion abnormalities. There is mildly asynchronous contraction consistent with intraventricular conduction delay (IVCD) or bundle Ruhaan Nordahl block. Doppler parameters are consistent with abnormal left ventricular relaxation (grade 1 diastolic dysfunction). - Aortic valve: Cusp separation was mildly reduced. There was mild to moderate stenosis. - Left atrium: The atrium was moderately dilated.  07/2015 echo  Study Conclusions  - Left ventricle: The cavity size was normal. Wall thickness was  increased in a pattern of mild LVH. Systolic function was mildly  reduced. The estimated ejection fraction was in the range of 45%  to 50%. Diffuse hypokinesis. Doppler parameters are consistent  with restrictive physiology, indicative of decreased left  ventricular diastolic compliance and/or increased left atrial  pressure. Doppler parameters are consistent with high ventricular  filling pressure. - Aortic valve: Valve mobility was restricted. There was mild to  moderate stenosis. Valve area (VTI): 0.96 cm^2. Valve area  (Vmax): 0.91 cm^2. Valve area (Vmean): 0.89 cm^2. - Mitral valve: Calcified annulus. There was mild regurgitation. - Left atrium: The atrium was moderately  dilated. - Right atrium: The atrium was mildly dilated. - Atrial septum: There was a patent foramen ovale.  Impressions:  - Mild global reduction in LV function; restrictive filling with  elevated filling pressure; mild LVH; biatrial enlargement;  heavily calcified aortic valve with probable mild to moderate AS  (mean gradient 16 mmHg; calculated AVA .9 cm2 likely  overestimates severity; dimensionless index .3 not supportive of  severe AS); mild MR; patent foramen ovale.  07/2015 Cath  Prox LAD to Mid LAD lesion, 50% stenosed.  1. Calcified moderate proximal-mid LAD stenosis 2. Widely patent, dominant LCx 3. Preserved cardiac  output, mildly elevated right-sided intracardiac pressures  Recommend: Medical therapy for nonobstructive CAD and CHF  01/2016 AAA [place] Screen No aneurysm     Assessment and Plan  1. CAD - moderate nonobstructive disease by prior cath.  - no recent symptoms, continue current meds   2. OSA - we will refer again to Dr Luan Pulling for evaluation  3. Hyperlipidemia -request pcp labs - continue statin   4. Aortic stenosis - mild to moderate by most recent echo  - no symptoms, repeat echo for continued surveillance.   5. HTN - above goal of 130/80, increase lisinopril to 5mg  daily.   7. Chronic diasotlic heart failure - no symptoms, continue current meds   F/u 6 months. Request pcp labs       Arnoldo Lenis, M.D

## 2017-08-15 NOTE — Patient Instructions (Signed)
Medication Instructions:   Your physician has recommended you make the following change in your medication:   Increase lisinopril to 5 mg by mouth daily. Please take (2) of your 2.5 mg tablets daily until they are finished.  Continue all other medications the same.  Labwork:  NONE  Testing/Procedures: Your physician has requested that you have an echocardiogram. Echocardiography is a painless test that uses sound waves to create images of your heart. It provides your doctor with information about the size and shape of your heart and how well your heart's chambers and valves are working. This procedure takes approximately one hour. There are no restrictions for this procedure.  Follow-Up:  Your physician recommends that you schedule a follow-up appointment in: 6 months. You will receive a reminder letter in the mail in about 4 months reminding you to call and schedule your appointment. If you don't receive this letter, please contact our office.  Any Other Special Instructions Will Be Listed Below (If Applicable).  You have been referred to Dr. Luan Pulling to manage your sleep apnea.  If you need a refill on your cardiac medications before your next appointment, please call your pharmacy.

## 2017-08-19 ENCOUNTER — Encounter: Payer: Self-pay | Admitting: Cardiology

## 2017-08-22 ENCOUNTER — Ambulatory Visit (HOSPITAL_COMMUNITY): Payer: Medicare HMO | Attending: Cardiology

## 2017-08-26 ENCOUNTER — Encounter (HOSPITAL_COMMUNITY): Payer: Self-pay

## 2017-08-26 ENCOUNTER — Other Ambulatory Visit: Payer: Self-pay

## 2017-08-26 ENCOUNTER — Emergency Department (HOSPITAL_COMMUNITY)
Admission: EM | Admit: 2017-08-26 | Discharge: 2017-08-26 | Disposition: A | Discharge status: Home / Self Care | Payer: Medicare HMO | Attending: Emergency Medicine | Admitting: Emergency Medicine

## 2017-08-26 DIAGNOSIS — E119 Type 2 diabetes mellitus without complications: Secondary | ICD-10-CM | POA: Insufficient documentation

## 2017-08-26 DIAGNOSIS — Z79899 Other long term (current) drug therapy: Secondary | ICD-10-CM | POA: Diagnosis not present

## 2017-08-26 DIAGNOSIS — I110 Hypertensive heart disease with heart failure: Secondary | ICD-10-CM | POA: Diagnosis not present

## 2017-08-26 DIAGNOSIS — Z7982 Long term (current) use of aspirin: Secondary | ICD-10-CM | POA: Diagnosis not present

## 2017-08-26 DIAGNOSIS — I2510 Atherosclerotic heart disease of native coronary artery without angina pectoris: Secondary | ICD-10-CM | POA: Diagnosis not present

## 2017-08-26 DIAGNOSIS — F17210 Nicotine dependence, cigarettes, uncomplicated: Secondary | ICD-10-CM | POA: Insufficient documentation

## 2017-08-26 DIAGNOSIS — R69 Illness, unspecified: Secondary | ICD-10-CM | POA: Diagnosis not present

## 2017-08-26 DIAGNOSIS — Z794 Long term (current) use of insulin: Secondary | ICD-10-CM | POA: Insufficient documentation

## 2017-08-26 DIAGNOSIS — I5031 Acute diastolic (congestive) heart failure: Secondary | ICD-10-CM | POA: Diagnosis not present

## 2017-08-26 DIAGNOSIS — M545 Low back pain: Secondary | ICD-10-CM | POA: Diagnosis not present

## 2017-08-26 MED ORDER — HYDROCODONE-ACETAMINOPHEN 5-325 MG PO TABS: 1.0000 | ORAL_TABLET | Freq: Four times a day (QID) | ORAL | 0 refills | Status: DC | PRN

## 2017-08-26 NOTE — ED Triage Notes (Signed)
Pt is complaining of back pain. 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 Pt has taken medication for the pain, but it was old and cant remember what it was. Pt has also taken ibuprofen and tylenol with no relief. Took some medication this morning, but does not remember med.

## 2017-08-26 NOTE — ED Notes (Signed)
PA at bedside.

## 2017-08-26 NOTE — Discharge Instructions (Addendum)
You were seen in the emergency department today for pain in your left lower back.  We suspect that this is muscle strain/spasm related at this time.  We are sending you home with a prescription for Norco (hydrocodone-acetaminophen).  This is a narcotic pain medication.  You may take 1 to 2 tablets every 6 hours as needed for severe pain.  Be aware that this medication has Tylenol in it (325 mg (patient not to exceed the maximum dose of Tylenol he may take in a day if you decide to supplement with Tylenol.  Please see Tylenol prescription bottle for further information.  Do not drive or operate heavy machinery when you take this medicine.  Do not combine this medicine with other sedating medicines or substances such as muscle relaxers or alcohol.  Keep this medication out of the reach of small children.  Additionally utilize over-the-counter lidocaine patches and apply a heating pack as discussed.  Follow-up with your primary care provider or the neurosurgeon who performed your neck procedure within 1 week.  Return to the ER for new or worsening symptoms including but not limited to fever, inability to walk, weakness, increased numbness, loss of control of your bladder or bowel function, or any other concerns that you may have.  Additionally follow-up with your primary care provider for a recheck of your blood pressure as this was elevated in the emergency department today.

## 2017-08-26 NOTE — ED Provider Notes (Signed)
[facility] EMERGENCY DEPARTMENT Provider Note   CSN: [id number] Arrival date & time: 08/26/17  2725     History   Chief Complaint Chief Complaint  Patient presents with  . Back Pain    HPI Jonathan Gilmore is a 67 y.o. male with a hx of tobacco abuse, CHF, HTN, sleep apnea, T2DM, and L4-L5 lumbar herniated disc who presents to the ED who presents to the ED with complaints of back pain x 1 week. 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 States pain has been fairly constant since onset, describes it as sharp/spasm/knot. Located in the left lower back radiating to buttocks/LLE at times. Rates pain a 7/10 at present, worse with standing long periods of time, walking, and changing positions, tried tylenol/motrin at home without relief. He states he mentioned pain to PCP at appointment and received a shot of unknown medicine which seemed to help at the time, DC with flexeril which he has been taking without relief.  Hx of similar pain the past, he believes this was improved with hydrocodone. No traumatic injuries. Denies numbness, weakness, incontinence to bowel/bladder, saddle anesthesia, fever, chills, IV drug use, or hx of cancer. Patient has baseline paresthesias to bilateral lower extremities with PVD which are unchanged.    HPI  Past Medical History:  Diagnosis Date  . Abnormal cardiovascular stress test 03/2013  . Back pain   . CHF (congestive heart failure) ([place])   . Coronary artery disease    Multivessel with significant LAD involvement by chest CT 2014  . Diastolic dysfunction   . Essential hypertension   . Lumbar herniated disc    L4-L5  . MRSA (methicillin resistant staph aureus) culture positive   . Shortness of breath dyspnea   . Sleep apnea   . Type 2 diabetes mellitus [facility])     Patient Active  Problem List   Diagnosis Date Noted  . CAD -50% LAD  08/09/2015  . Moderate aortic stenosis 08/09/2015  . Acute pulmonary edema ([place]) 08/07/2015  . Acute diastolic CHF (congestive heart failure) ([place]) 08/07/2015  . Hypersomnia with sleep apnea, unspecified 09/20/2013  . Syncope 09/19/2013  . 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  . OSA (obstructive sleep apnea) -non compliant 09/19/2013  . Narcolepsy and cataplexy 09/19/2013  . Facial abscess 04/28/2013  . Facial cellulitis 04/28/2013  . Hyperlipidemia 04/28/2013  . Tobacco dependence 04/28/2013  . Sepsis ([place]) 04/28/2013  . Gout 04/28/2013  . Diastolic CHF ([place]) 36/64/4034  . Pleuritic pain 02/24/2013  . Chest pain 02/24/2013  . Murmur 02/24/2013  . Pericarditis 02/24/2013  . DM type 2 (diabetes mellitus, type 2) ([place]) 02/24/2013  . HTN (hypertension) 02/24/2013  . Cervical spondylosis with myelopathy 01/19/2011    Past Surgical History:  Procedure Laterality Date  . ANTERIOR CERVICAL DECOMP/DISCECTOMY FUSION  01/18/2011   Procedure: ANTERIOR CERVICAL DECOMPRESSION/DISCECTOMY FUSION 2 LEVELS;  Surgeon: Cooper Render Pool;  Location: [place] NEURO ORS;  Service: Neurosurgery;  Laterality: N/A;  anterior cervical discectomy and fusion with allograft and plating, cervical five-six, cervical six-seven  . BIOPSY N/A 04/04/2015   Procedure: BIOPSY;  Surgeon: Rogene Houston, MD;  [facility];  Service: Endoscopy;  Laterality: N/A;  . Bone spur removed  1997   Left shoulder  . CARDIAC CATHETERIZATION N/A 08/08/2015   Procedure: Right/Left Heart Cath and Coronary  Angiography;  Surgeon: Sherren Mocha, MD;  Location: [place] CV LAB;  Service: Cardiovascular;  Laterality: N/A;  . CARPAL TUNNEL RELEASE Right   . ESOPHAGEAL DILATION N/A 04/04/2015   Procedure: ESOPHAGEAL DILATION;  Surgeon: Rogene Houston, MD;  [facility];  Service: Endoscopy;  Laterality: N/A;  . ESOPHAGOGASTRODUODENOSCOPY N/A 04/04/2015   Procedure:  ESOPHAGOGASTRODUODENOSCOPY (EGD);  Surgeon: Rogene Houston, MD;  [facility];  Service: Endoscopy;  Laterality: N/A;  1240  . HEMORRHOID SURGERY          Home Medications    Prior to Admission medications   Medication Sig Start Date End Date Taking? Authorizing Provider  aspirin EC 81 MG tablet Take 81 mg by mouth daily.    [provider]  atorvastatin (LIPITOR) 80 MG tablet TAKE 1 TABLET (80 MG TOTAL) BY MOUTH DAILY AT 6 PM. 06/20/17   Kilroy, Doreene Burke, PA-C  CALCIUM PO Take 1 tablet by mouth daily.    [provider]  Cyanocobalamin (VITAMIN B 12 PO) Take 1 tablet by mouth daily.    [provider]  diphenhydrAMINE (BENADRYL) 25 MG tablet Take 100 mg by mouth at bedtime.    [provider]  Dulaglutide (TRULICITY) 1.5 HY/8.6VH SOPN Inject 0.5 mLs into the skin once a week.    [provider]  Flaxseed, Linseed, (FLAXSEED OIL PO) Take 1 tablet by mouth daily.     [provider]  furosemide (LASIX) 40 MG tablet TAKE 1 TABLET BY MOUTH EVERY DAY 05/10/17   Arnoldo Lenis, MD  gabapentin (NEURONTIN) 300 MG capsule Take 1 capsule (300 mg total) by mouth at bedtime. 09/20/13   Black, Lezlie Octave, NP  HYDROcodone-acetaminophen (NORCO) 10-325 MG per tablet Take 1-2 tablets by mouth every 6 (six) hours as needed for severe pain.     [provider]  ibuprofen (ADVIL,MOTRIN) 200 MG tablet Take 800 mg by mouth daily as needed for moderate pain.    [provider]  insulin glargine (LANTUS) 100 UNIT/ML injection Inject 100 Units into the skin at bedtime.    [provider]  lisinopril (PRINIVIL,ZESTRIL) 5 MG tablet Take 1 tablet (5 mg total) by mouth daily. 08/15/17 11/13/17  Arnoldo Lenis, MD  MAGNESIUM PO Take 1 tablet by mouth daily.    [provider]  metFORMIN (GLUCOPHAGE) 500 MG tablet Take 1 tablet (500 mg total) by mouth 2 (two) times daily. 08/11/15   Erlene Quan, PA-C  metoprolol succinate  (TOPROL-XL) 25 MG 24 hr tablet TAKE 1 TABLET BY MOUTH EVERY DAY 09/13/16   Arnoldo Lenis, MD  Multiple Vitamin (MULTIVITAMIN WITH MINERALS) TABS Take 1 tablet by mouth daily.    [provider]  nitroGLYCERIN (NITROSTAT) 0.4 MG SL tablet Place 1 tablet (0.4 mg total) under the tongue every 5 (five) minutes as needed for chest pain. 03/06/13   Lendon Colonel, NP  POTASSIUM PO Take 1 tablet by mouth daily.    [provider]  testosterone cypionate (DEPOTESTOTERONE CYPIONATE) 100 MG/ML injection Inject 100 mg into the muscle every 28 (twenty-eight) days. For IM use only    [provider]  traMADol (ULTRAM) 50 MG tablet Take 1 tablet (50 mg total) by mouth every 6 (six) hours as needed. 04/22/17   Long, Wonda Olds, MD    Family History Family History  Problem Relation Age of Onset  . Lung cancer Mother   . Diabetes Father   . Hepatitis  B Father   . Diabetes Sister   . Cushing syndrome Sister     Social History Social History   Tobacco Use  . Smoking status: Current Every Day Smoker    Packs/day: 1.00    Years: 50.00    Pack years: 50.00    Types: Cigarettes  . Smokeless tobacco: Former Systems developer  . Tobacco comment: smoking x 50 yrs  Substance Use Topics  . Alcohol use: No    Alcohol/week: 0.0 oz  . Drug use: No     Allergies   Morphine and related   Review of Systems Review of Systems  Constitutional: Negative for chills and fever.  Gastrointestinal: Negative for abdominal pain.  Genitourinary: Negative for dysuria and hematuria.  Musculoskeletal: Positive for back pain.  Neurological: Negative for weakness and numbness.       Positive for chronic BLE paresthesias, unchanged. Negative for incontinence or saddle anesthesia.     Physical Exam Updated Vital Signs BP (!) 173/83 (BP Location: Left Arm) Comment: Painful  Pulse 82   Temp 98.2 F (36.8 C) (Oral)   Resp 14   Ht 5\' 7"  (1.702 m)   Wt 96.2 kg (212 lb)   SpO2 96%   BMI 33.20  kg/m   Physical Exam  Constitutional: He appears well-developed and well-nourished. No distress.  HENT:  Head: Normocephalic and atraumatic.  Eyes: Conjunctivae are normal. Right eye exhibits no discharge. Left eye exhibits no discharge.  Cardiovascular: Normal rate and regular rhythm.  Pulmonary/Chest: Effort normal and breath sounds normal.  Abdominal: Soft. He exhibits no distension. There is no tenderness.  Musculoskeletal:  Back: No obvious deformities, appreciable swelling, erythema, ecchymosis, open wounds, or overlying warmth.  There is no midline tenderness to palpation.  Patient has left lumbar paraspinal muscle tenderness palpation with palpable muscle spasm.  Neurological: He is alert.  Clear speech.  Sensation grossly intact bilateral lower extremities.  5 out of 5 symmetric strength plantar dorsiflexion bilaterally.  Patient is able to lift bilateral legs off the bed without difficulty.  Patellar DTRs are 2+ and symmetric.  Gait is somewhat antalgic but intact. Some reproducibility with LLE straight leg raise.   Psychiatric: He has a normal mood and affect. His behavior is normal. Thought content normal.  Nursing note and vitals reviewed.  ED Treatments / Results  Labs (all labs ordered are listed, but only abnormal results are displayed) Labs Reviewed - No data to display  EKG None  Radiology No results found.  Procedures Procedures (including critical care time)  Medications Ordered in ED Medications - No data to display   Initial Impression / Assessment and Plan / ED Course  I have reviewed the triage vital signs and the nursing notes.  Pertinent labs & imaging results that were available during my care of the patient were reviewed by me and considered in my medical decision making (see chart for details).    Patient presents with complaint of back pain.  Patient is nontoxic appearing, vitals are WNL, other than elevated BP, doubt HTN emergency, patient aware  of need for recheck. Patient has normal neurologic exam, no midline tenderness to palpation. He is ambulatory in the ED.  No back pain red flags. No urinary sxs. Most likely muscle strain versus spasm with +/- sciatica component. Considered UTI/pyelonephritis, kidney stone, aortic aneurysm/dissection, cauda equina or epidural abscess however these do not fit clinical picture at this time. Will treat with short course of hydrocodone- [facility] Controlled Substance reporting System queried.  NSAIDs avoided given hx of CAD, steroids avoided given hx of DM.  I discussed treatment plan, need for PCP follow-up, and return precautions with the patient. Provided opportunity for questions, patient confirmed understanding and is in agreement with plan.   Final Clinical Impressions(s) / ED Diagnoses   Final diagnoses:  Acute left-sided low back pain, with sciatica presence unspecified    ED Discharge Orders        Ordered    HYDROcodone-acetaminophen (NORCO/VICODIN) 5-325 MG tablet  Every 6 hours PRN     08/26/17 0926       Amaryllis Dyke, PA-C 08/26/17 1650    Virgel Manifold, MD 08/28/17 [id number]

## 2017-08-31 DIAGNOSIS — M9905 Segmental and somatic dysfunction of pelvic region: Secondary | ICD-10-CM | POA: Diagnosis not present

## 2017-08-31 DIAGNOSIS — M9902 Segmental and somatic dysfunction of thoracic region: Secondary | ICD-10-CM | POA: Diagnosis not present

## 2017-08-31 DIAGNOSIS — M9903 Segmental and somatic dysfunction of lumbar region: Secondary | ICD-10-CM | POA: Diagnosis not present

## 2017-08-31 DIAGNOSIS — M545 Low back pain: Secondary | ICD-10-CM | POA: Diagnosis not present

## 2017-09-01 ENCOUNTER — Other Ambulatory Visit (HOSPITAL_COMMUNITY): Payer: Self-pay | Admitting: *Deleted

## 2017-09-01 ENCOUNTER — Ambulatory Visit (HOSPITAL_COMMUNITY)
Admission: RE | Admit: 2017-09-01 | Discharge: 2017-09-01 | Disposition: A | Discharge status: Home / Self Care | Payer: Medicare HMO | Source: Ambulatory Visit | Attending: *Deleted | Admitting: *Deleted

## 2017-09-01 DIAGNOSIS — M5442 Lumbago with sciatica, left side: Secondary | ICD-10-CM

## 2017-09-01 DIAGNOSIS — I700 Atherosclerosis of aorta: Secondary | ICD-10-CM | POA: Insufficient documentation

## 2017-09-01 DIAGNOSIS — M4186 Other forms of scoliosis, lumbar region: Secondary | ICD-10-CM | POA: Insufficient documentation

## 2017-09-01 DIAGNOSIS — M545 Low back pain: Secondary | ICD-10-CM | POA: Diagnosis not present

## 2017-09-01 DIAGNOSIS — M1990 Unspecified osteoarthritis, unspecified site: Secondary | ICD-10-CM | POA: Diagnosis not present

## 2017-09-04 ENCOUNTER — Other Ambulatory Visit: Payer: Self-pay

## 2017-09-04 ENCOUNTER — Encounter (HOSPITAL_COMMUNITY): Payer: Self-pay | Admitting: *Deleted

## 2017-09-04 ENCOUNTER — Emergency Department (HOSPITAL_COMMUNITY)
Admission: EM | Admit: 2017-09-04 | Discharge: 2017-09-04 | Disposition: A | Discharge status: Home / Self Care | Payer: Medicare HMO | Attending: Emergency Medicine | Admitting: Emergency Medicine

## 2017-09-04 DIAGNOSIS — F17210 Nicotine dependence, cigarettes, uncomplicated: Secondary | ICD-10-CM | POA: Diagnosis not present

## 2017-09-04 DIAGNOSIS — R69 Illness, unspecified: Secondary | ICD-10-CM | POA: Diagnosis not present

## 2017-09-04 DIAGNOSIS — Z794 Long term (current) use of insulin: Secondary | ICD-10-CM | POA: Insufficient documentation

## 2017-09-04 DIAGNOSIS — I110 Hypertensive heart disease with heart failure: Secondary | ICD-10-CM | POA: Insufficient documentation

## 2017-09-04 DIAGNOSIS — Z79899 Other long term (current) drug therapy: Secondary | ICD-10-CM | POA: Insufficient documentation

## 2017-09-04 DIAGNOSIS — I5030 Unspecified diastolic (congestive) heart failure: Secondary | ICD-10-CM | POA: Diagnosis not present

## 2017-09-04 DIAGNOSIS — Z7982 Long term (current) use of aspirin: Secondary | ICD-10-CM | POA: Diagnosis not present

## 2017-09-04 DIAGNOSIS — I259 Chronic ischemic heart disease, unspecified: Secondary | ICD-10-CM | POA: Insufficient documentation

## 2017-09-04 DIAGNOSIS — M5489 Other dorsalgia: Secondary | ICD-10-CM | POA: Diagnosis present

## 2017-09-04 DIAGNOSIS — M5442 Lumbago with sciatica, left side: Secondary | ICD-10-CM | POA: Diagnosis not present

## 2017-09-04 DIAGNOSIS — E119 Type 2 diabetes mellitus without complications: Secondary | ICD-10-CM | POA: Insufficient documentation

## 2017-09-04 MED ORDER — KETOROLAC TROMETHAMINE 30 MG/ML IJ SOLN
30.0000 mg | Freq: Once | INTRAMUSCULAR | Status: AC
  Administered 2017-09-04: 30 mg via INTRAMUSCULAR
  Filled 2017-09-04: qty 1

## 2017-09-04 MED ORDER — CYCLOBENZAPRINE HCL 10 MG PO TABS: 10.0000 mg | ORAL_TABLET | Freq: Two times a day (BID) | ORAL | 0 refills | Status: DC | PRN

## 2017-09-04 MED ORDER — CYCLOBENZAPRINE HCL 10 MG PO TABS
10.0000 mg | ORAL_TABLET | Freq: Once | ORAL | Status: AC
  Administered 2017-09-04: 10 mg via ORAL
  Filled 2017-09-04: qty 1

## 2017-09-04 NOTE — Discharge Instructions (Addendum)
Please add ibuprofen 600 mg 3 times a day to your regimen Follow-up with your doctor as scheduled this week Return if you have any weakness in your left leg.

## 2017-09-04 NOTE — ED Triage Notes (Addendum)
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 Pt states that the pain radiates down left leg, denies any loss of urine or bowel function,

## 2017-09-04 NOTE — ED Provider Notes (Signed)
[facility] EMERGENCY DEPARTMENT Provider Note   CSN: [id number] Arrival date & time: 09/04/17  0932     History   Chief Complaint Chief Complaint  Patient presents with  . Back Pain    HPI Jonathan Gilmore is a 67 y.o. male.  HPI 67 yo male complaining of left lower back pain began 1.5 weeks ago while under machine- no injury.  Pain is sharp, severe, radiates to back of left leg causing pain when attempting to walk.  Patient seen here last Wednesday, saw pmd Dr. Melina Copa last [place].  Went to chiropractor and had x-Juquan Reznick here.  Appointment for MD in [facility]- Wednesday at 0900.  Patient states taking tylenol with last dose 0400 without relief.  Denies taking other medication for this. Past Medical History:  Diagnosis Date  . Abnormal cardiovascular stress test 03/2013  . Back pain   . CHF (congestive heart failure) ([place])   . Coronary artery disease    Multivessel with significant LAD involvement by chest CT 2014  . Diastolic dysfunction   . Essential hypertension   . Lumbar herniated disc    L4-L5  . MRSA (methicillin resistant staph aureus) culture positive   . Shortness of breath dyspnea   . Sleep apnea   . Type 2 diabetes mellitus [facility])     Patient Active Problem List   Diagnosis Date Noted  . CAD -50% LAD  08/09/2015  . Moderate aortic stenosis 08/09/2015  . Acute pulmonary edema ([place]) 08/07/2015  . Acute diastolic CHF (congestive heart failure) ([place]) 08/07/2015  . Hypersomnia with sleep apnea, unspecified 09/20/2013  . Syncope 09/19/2013  . 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  . OSA (obstructive sleep apnea) -non compliant 09/19/2013  . Narcolepsy and cataplexy 09/19/2013  . Facial abscess 04/28/2013  . Facial cellulitis 04/28/2013  . Hyperlipidemia 04/28/2013  . Tobacco dependence 04/28/2013  . Sepsis ([place]) 04/28/2013  . Gout 04/28/2013  . Diastolic CHF ([place]) 35/57/3220  . Pleuritic pain 02/24/2013  . Chest pain 02/24/2013  . Murmur 02/24/2013  .  Pericarditis 02/24/2013  . DM type 2 (diabetes mellitus, type 2) ([place]) 02/24/2013  . HTN (hypertension) 02/24/2013  . Cervical spondylosis with myelopathy 01/19/2011    Past Surgical History:  Procedure Laterality Date  . ANTERIOR CERVICAL DECOMP/DISCECTOMY FUSION  01/18/2011   Procedure: ANTERIOR CERVICAL DECOMPRESSION/DISCECTOMY FUSION 2 LEVELS;  Surgeon: Cooper Render Pool;  Location: [place] NEURO ORS;  Service: Neurosurgery;  Laterality: N/A;  anterior cervical discectomy and fusion with allograft and plating, cervical five-six, cervical six-seven  . BIOPSY N/A 04/04/2015   Procedure: BIOPSY;  Surgeon: Rogene Houston, MD;  [facility];  Service: Endoscopy;  Laterality: N/A;  . Bone spur removed  1997   Left shoulder  . CARDIAC CATHETERIZATION N/A 08/08/2015   Procedure: Right/Left Heart Cath and Coronary Angiography;  Surgeon: Sherren Mocha, MD;  Location: [place] CV LAB;  Service: Cardiovascular;  Laterality: N/A;  . CARPAL TUNNEL RELEASE Right   . ESOPHAGEAL DILATION N/A 04/04/2015   Procedure: ESOPHAGEAL DILATION;  Surgeon: Rogene Houston, MD;  [facility];  Service: Endoscopy;  Laterality: N/A;  . ESOPHAGOGASTRODUODENOSCOPY N/A 04/04/2015   Procedure: ESOPHAGOGASTRODUODENOSCOPY (EGD);  Surgeon: Rogene Houston, MD;  [facility];  Service: Endoscopy;  Laterality: N/A;  1240  . HEMORRHOID SURGERY          Home Medications    Prior to Admission medications   Medication Sig Start Date End Date Taking? Authorizing Provider  aspirin  EC 81 MG tablet Take 81 mg by mouth daily.    [provider]  atorvastatin (LIPITOR) 80 MG tablet TAKE 1 TABLET (80 MG TOTAL) BY MOUTH DAILY AT 6 PM. 06/20/17   Kilroy, Doreene Burke, PA-C  CALCIUM PO Take 1 tablet by mouth daily.    [provider]  Cyanocobalamin (VITAMIN B 12 PO) Take 1 tablet by mouth daily.    [provider]  diphenhydrAMINE (BENADRYL) 25 MG tablet Take 100 mg by mouth at bedtime.     [provider]  Dulaglutide (TRULICITY) 1.5 XF/8.1WE SOPN Inject 0.5 mLs into the skin once a week.    [provider]  Flaxseed, Linseed, (FLAXSEED OIL PO) Take 1 tablet by mouth daily.     [provider]  furosemide (LASIX) 40 MG tablet TAKE 1 TABLET BY MOUTH EVERY DAY 05/10/17   Arnoldo Lenis, MD  gabapentin (NEURONTIN) 300 MG capsule Take 1 capsule (300 mg total) by mouth at bedtime. 09/20/13   Black, Lezlie Octave, NP  HYDROcodone-acetaminophen (NORCO/VICODIN) 5-325 MG tablet Take 1-2 tablets by mouth every 6 (six) hours as needed. 08/26/17   Petrucelli, Samantha R, PA-C  ibuprofen (ADVIL,MOTRIN) 200 MG tablet Take 800 mg by mouth daily as needed for moderate pain.    [provider]  insulin glargine (LANTUS) 100 UNIT/ML injection Inject 100 Units into the skin at bedtime.    [provider]  lisinopril (PRINIVIL,ZESTRIL) 5 MG tablet Take 1 tablet (5 mg total) by mouth daily. 08/15/17 11/13/17  Arnoldo Lenis, MD  MAGNESIUM PO Take 1 tablet by mouth daily.    [provider]  metFORMIN (GLUCOPHAGE) 500 MG tablet Take 1 tablet (500 mg total) by mouth 2 (two) times daily. 08/11/15   Erlene Quan, PA-C  metoprolol succinate (TOPROL-XL) 25 MG 24 hr tablet TAKE 1 TABLET BY MOUTH EVERY DAY 09/13/16   Arnoldo Lenis, MD  Multiple Vitamin (MULTIVITAMIN WITH MINERALS) TABS Take 1 tablet by mouth daily.    [provider]  nitroGLYCERIN (NITROSTAT) 0.4 MG SL tablet Place 1 tablet (0.4 mg total) under the tongue every 5 (five) minutes as needed for chest pain. 03/06/13   Lendon Colonel, NP  POTASSIUM PO Take 1 tablet by mouth daily.    [provider]  testosterone cypionate (DEPOTESTOTERONE CYPIONATE) 100 MG/ML injection Inject 100 mg into the muscle every 28 (twenty-eight) days. For IM use only    [provider]  traMADol (ULTRAM) 50 MG tablet Take 1 tablet (50 mg total) by mouth every 6 (six) hours as needed. 04/22/17    Long, Wonda Olds, MD    Family History Family History  Problem Relation Age of Onset  . Lung cancer Mother   . Diabetes Father   . Hepatitis B Father   . Diabetes Sister   . Cushing syndrome Sister     Social History Social History   Tobacco Use  . Smoking status: Current Every Day Smoker    Packs/day: 1.00    Years: 50.00    Pack years: 50.00    Types: Cigarettes  . Smokeless tobacco: Former Systems developer  . Tobacco comment: smoking x 50 yrs  Substance Use Topics  . Alcohol use: No    Alcohol/week: 0.0 oz  . Drug use: No     Allergies   Morphine and related   Review of Systems Review of Systems  Constitutional: Negative.   HENT: Negative.   Eyes: Negative.   Respiratory: Negative.  Cardiovascular: Negative.   Gastrointestinal: Negative for constipation and diarrhea.  Genitourinary: Negative for decreased urine volume and urgency.  Musculoskeletal: Positive for back pain and gait problem.  Skin: Negative.   Neurological: Negative for weakness.  Psychiatric/Behavioral: Negative.   All other systems reviewed and are negative.    Physical Exam Updated Vital Signs BP (!) 160/70   Pulse 80   Temp 98.2 F (36.8 C) (Oral)   Resp 18   SpO2 95%   Physical Exam  Constitutional: He is oriented to person, place, and time. He appears well-developed and well-nourished.  HENT:  Head: Normocephalic and atraumatic.  Right Ear: External ear normal.  Left Ear: External ear normal.  Nose: Nose normal.  Mouth/Throat: Oropharynx is clear and moist.  Eyes: Pupils are equal, round, and reactive to light. Conjunctivae and EOM are normal.  Neck: Normal range of motion. Neck supple.  Cardiovascular: Normal rate and regular rhythm.  Pulmonary/Chest: Effort normal and breath sounds normal.  Abdominal: Soft. Bowel sounds are normal.  Musculoskeletal: Normal range of motion. He exhibits tenderness.       Arms: Neurological: He is alert and oriented to person, place, and time. He  has normal strength and normal reflexes. He displays normal reflexes. No cranial nerve deficit or sensory deficit. He exhibits normal muscle tone. Coordination normal.  Nursing note and vitals reviewed.    ED Treatments / Results  Labs (all labs ordered are listed, but only abnormal results are displayed) Labs Reviewed - No data to display  EKG None  Radiology No results found.  Procedures Procedures (including critical care time)  Medications Ordered in ED Medications - No data to display   Initial Impression / Assessment and Plan / ED Course  I have reviewed the triage vital signs and the nursing notes.  Pertinent labs & imaging results that were available during my care of the patient were reviewed by me and considered in my medical decision making (see chart for details).   67 year old man with point tenderness left external iliac joint.  He has been seen and evaluated by his primary care doctor to set up for follow-up this week.  He is not taking medication besides Tylenol.  He is given a shot of Toradol and dose of Flexeril here.  He is advised to add ibuprofen to his regimen and follow-up as he has previously been instructed.  He is given return precautions and voices understanding.    Final Clinical Impressions(s) / ED Diagnoses   Final diagnoses:  Acute left-sided low back pain with left-sided sciatica    ED Discharge Orders    None       Pattricia Boss, MD 09/04/17 [phone]

## 2017-09-07 DIAGNOSIS — M5432 Sciatica, left side: Secondary | ICD-10-CM | POA: Diagnosis not present

## 2017-09-08 DIAGNOSIS — M5136 Other intervertebral disc degeneration, lumbar region: Secondary | ICD-10-CM | POA: Diagnosis not present

## 2017-09-10 DIAGNOSIS — M545 Low back pain: Secondary | ICD-10-CM | POA: Diagnosis not present

## 2017-09-27 DIAGNOSIS — M545 Low back pain: Secondary | ICD-10-CM | POA: Diagnosis not present

## 2017-09-27 DIAGNOSIS — M5432 Sciatica, left side: Secondary | ICD-10-CM | POA: Diagnosis not present

## 2017-09-27 DIAGNOSIS — M5126 Other intervertebral disc displacement, lumbar region: Secondary | ICD-10-CM | POA: Diagnosis not present

## 2017-09-27 DIAGNOSIS — M5416 Radiculopathy, lumbar region: Secondary | ICD-10-CM | POA: Diagnosis not present

## 2017-10-12 DIAGNOSIS — M5416 Radiculopathy, lumbar region: Secondary | ICD-10-CM | POA: Diagnosis not present

## 2017-10-12 DIAGNOSIS — M5126 Other intervertebral disc displacement, lumbar region: Secondary | ICD-10-CM | POA: Diagnosis not present

## 2017-10-13 DIAGNOSIS — R69 Illness, unspecified: Secondary | ICD-10-CM | POA: Diagnosis not present

## 2017-10-31 DIAGNOSIS — M545 Low back pain: Secondary | ICD-10-CM | POA: Diagnosis not present

## 2017-10-31 DIAGNOSIS — M5126 Other intervertebral disc displacement, lumbar region: Secondary | ICD-10-CM | POA: Diagnosis not present

## 2017-10-31 DIAGNOSIS — M5416 Radiculopathy, lumbar region: Secondary | ICD-10-CM | POA: Diagnosis not present

## 2017-11-05 ENCOUNTER — Other Ambulatory Visit: Payer: Self-pay | Admitting: Cardiology

## 2017-11-11 DIAGNOSIS — M5416 Radiculopathy, lumbar region: Secondary | ICD-10-CM | POA: Diagnosis not present

## 2017-11-11 DIAGNOSIS — M545 Low back pain: Secondary | ICD-10-CM | POA: Diagnosis not present

## 2017-11-11 DIAGNOSIS — M5126 Other intervertebral disc displacement, lumbar region: Secondary | ICD-10-CM | POA: Diagnosis not present

## 2017-11-17 DIAGNOSIS — Z125 Encounter for screening for malignant neoplasm of prostate: Secondary | ICD-10-CM | POA: Diagnosis not present

## 2017-11-17 DIAGNOSIS — I10 Essential (primary) hypertension: Secondary | ICD-10-CM | POA: Diagnosis not present

## 2017-11-17 DIAGNOSIS — E109 Type 1 diabetes mellitus without complications: Secondary | ICD-10-CM | POA: Diagnosis not present

## 2017-11-17 DIAGNOSIS — G629 Polyneuropathy, unspecified: Secondary | ICD-10-CM | POA: Diagnosis not present

## 2017-11-17 DIAGNOSIS — R69 Illness, unspecified: Secondary | ICD-10-CM | POA: Diagnosis not present

## 2017-11-17 DIAGNOSIS — G894 Chronic pain syndrome: Secondary | ICD-10-CM | POA: Diagnosis not present

## 2017-11-17 DIAGNOSIS — M5136 Other intervertebral disc degeneration, lumbar region: Secondary | ICD-10-CM | POA: Diagnosis not present

## 2017-11-29 DIAGNOSIS — E291 Testicular hypofunction: Secondary | ICD-10-CM | POA: Diagnosis not present

## 2017-11-29 DIAGNOSIS — E669 Obesity, unspecified: Secondary | ICD-10-CM | POA: Diagnosis not present

## 2017-11-29 DIAGNOSIS — Z794 Long term (current) use of insulin: Secondary | ICD-10-CM | POA: Diagnosis not present

## 2017-11-29 DIAGNOSIS — I10 Essential (primary) hypertension: Secondary | ICD-10-CM | POA: Diagnosis not present

## 2017-11-29 DIAGNOSIS — E785 Hyperlipidemia, unspecified: Secondary | ICD-10-CM | POA: Diagnosis not present

## 2017-11-29 DIAGNOSIS — G8929 Other chronic pain: Secondary | ICD-10-CM | POA: Diagnosis not present

## 2017-11-29 DIAGNOSIS — E1151 Type 2 diabetes mellitus with diabetic peripheral angiopathy without gangrene: Secondary | ICD-10-CM | POA: Diagnosis not present

## 2017-11-29 DIAGNOSIS — I700 Atherosclerosis of aorta: Secondary | ICD-10-CM | POA: Diagnosis not present

## 2017-11-29 DIAGNOSIS — R69 Illness, unspecified: Secondary | ICD-10-CM | POA: Diagnosis not present

## 2017-11-29 DIAGNOSIS — E1165 Type 2 diabetes mellitus with hyperglycemia: Secondary | ICD-10-CM | POA: Diagnosis not present

## 2017-12-02 DIAGNOSIS — E782 Mixed hyperlipidemia: Secondary | ICD-10-CM | POA: Diagnosis not present

## 2017-12-02 DIAGNOSIS — E109 Type 1 diabetes mellitus without complications: Secondary | ICD-10-CM | POA: Diagnosis not present

## 2017-12-02 DIAGNOSIS — I10 Essential (primary) hypertension: Secondary | ICD-10-CM | POA: Diagnosis not present

## 2017-12-02 DIAGNOSIS — E2740 Unspecified adrenocortical insufficiency: Secondary | ICD-10-CM | POA: Diagnosis not present

## 2017-12-06 DIAGNOSIS — M545 Low back pain: Secondary | ICD-10-CM | POA: Diagnosis not present

## 2017-12-06 DIAGNOSIS — M5416 Radiculopathy, lumbar region: Secondary | ICD-10-CM | POA: Diagnosis not present

## 2017-12-06 DIAGNOSIS — M5126 Other intervertebral disc displacement, lumbar region: Secondary | ICD-10-CM | POA: Diagnosis not present

## 2017-12-08 ENCOUNTER — Ambulatory Visit (INDEPENDENT_AMBULATORY_CARE_PROVIDER_SITE_OTHER): Payer: Self-pay | Admitting: Orthopedic Surgery

## 2017-12-15 DIAGNOSIS — R69 Illness, unspecified: Secondary | ICD-10-CM | POA: Diagnosis not present

## 2017-12-21 ENCOUNTER — Ambulatory Visit (INDEPENDENT_AMBULATORY_CARE_PROVIDER_SITE_OTHER): Payer: Medicare HMO | Admitting: Orthopaedic Surgery

## 2017-12-21 ENCOUNTER — Encounter (INDEPENDENT_AMBULATORY_CARE_PROVIDER_SITE_OTHER): Payer: Self-pay | Admitting: Orthopaedic Surgery

## 2017-12-21 VITALS — BP 160/76 | HR 70 | Ht 67.0 in | Wt 200.0 lb

## 2017-12-21 DIAGNOSIS — M5116 Intervertebral disc disorders with radiculopathy, lumbar region: Secondary | ICD-10-CM | POA: Insufficient documentation

## 2017-12-21 DIAGNOSIS — M48061 Spinal stenosis, lumbar region without neurogenic claudication: Secondary | ICD-10-CM

## 2017-12-21 NOTE — Progress Notes (Signed)
Office Visit Note   Patient: Jonathan Gilmore           Date of Birth: 04-21-1950           MRN: [id number] Visit Date: 12/21/2017              Requested by: Octavio Graves, [place] [address] [place], [place] [address] PCP: Octavio Graves, DO   Assessment & Plan: Visit Diagnoses:  1. Lumbar disc herniation with radiculopathy   2. Spinal stenosis of lumbar region without neurogenic claudication     Plan: Patient has extruded fragment with compression and also has spinal stenosis.  He did have some disc protrusion at L4-5 on previous MRI 2010 but current MRI from 09/10/2017 shows severe spinal stenosis with large extruded fragment and left L5 nerve root compression he has partial foot drop.  I recommend proceeding with decompression surgery on the left side with microdiscectomy laminotomy at L4 and L5.  Removal of disc extruded fragment.  He did be in the hospital overnight.  We discussed potential for disc recurrence.  Risk of dural tear discussed potential for progression of problems with instability and need for lumbar spinal fusion.  Patient's wife is present today and included with the discussion.  He has been on a significant amount of pain medication we discussed potential for problems with the pain medicine after the surgery.  Follow-Up Instructions: No follow-ups on file.   Orders:  No orders of the defined types were placed in this encounter.  No orders of the defined types were placed in this encounter.     Procedures: No procedures performed   Clinical Data: No additional findings.   Subjective: Chief Complaint  Patient presents with  . 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  Patient states he was on his back pushing up pulling on a pulley for a belt to turn the blades and had onset of back pain left leg pain.  He has had some problems with back  trouble in the past off and on but nothing like the pain that he had starting in Aug 09, 2017.  Previous MRI 10/27/2008 ordered by Dr. Herma Mering.  MRI scan lumbar spine done on 09/10/2017 showed L4-5 left paracentral disc extrusion and inferior migration with high-grade spinal stenosis multifactorial but primarily due to the disc extrusion with left L5 nerve root impingement.  Patient has been taking Percocet 10.  Narcotic website reviewed.  He has had 51 prescriptions over the last 2 years.  One point he was getting Ultram 240 he is been on Norco fives and also Norco tens.  Most recently Percocet 10/325 1 or 2 a day.  Patient had weakness with walking with his left foot drags his foot occasionally catches his toe.  He has problems getting into an erect position pain is been constant persistent.  He has been taking narcotics with increased dosage over the last several months.  He has had 2 epidurals which were not effective.  Review of Systems past history of cervical fusion for cervical spondylosis.  Type 2 diabetes, hypertension hyperlipidemia tobacco use history of gout history of diastolic heart failure.  Sleep apnea.  Coronary artery disease with LAD 50%.  Moderate aortic stenosis.  He denies current chest pain.   Objective: Vital Signs: BP (!) 160/76   Pulse 70  Ht 5\' 7"  (1.702 m)   Wt 200 lb (90.7 kg)   BMI 31.32 kg/m   Physical Exam  Constitutional: He is oriented to person, place, and time. He appears well-developed and well-nourished.  HENT:  Head: Normocephalic and atraumatic.  Eyes: Pupils are equal, round, and reactive to light. EOM are normal.  Neck: No tracheal deviation present. No thyromegaly present.  Cardiovascular: Normal rate.  Pulmonary/Chest: Effort normal. He has no wheezes.  Abdominal: Soft. Bowel sounds are normal.  Neurological: He is alert and oriented to person, place, and time.  Skin: Skin is warm and dry. Capillary refill takes less than 2 seconds.  Psychiatric: He  has a normal mood and affect. His behavior is normal. Judgment and thought content normal.    Ortho Exam positive straight leg raising on the left at 70 degrees.  Anterior tib weakness EHL weakness on the left normal on the right.  Has trace ankle jerks symmetrical right and left.  Gastrocsoleus is strong.  Patient ambulates with partial foot drop gait. Specialty Comments:  No specialty comments available.  Imaging: MRI scan 09/10/2017 shows L4-5 left paracentral inferiorly migrated disc extrusion compressing the left L5 nerve root.  Disc bulge and endplate ridging with high-grade spinal stenosis.  Mild disc bulging at other levels without significant compression.   PMFS History: Patient Active Problem List   Diagnosis Date Noted  . CAD -50% LAD  08/09/2015  . Moderate aortic stenosis 08/09/2015  . Acute pulmonary edema ([place]) 08/07/2015  . Acute diastolic CHF (congestive heart failure) ([place]) 08/07/2015  . Hypersomnia with sleep apnea, unspecified 09/20/2013  . Syncope 09/19/2013  . 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  . OSA (obstructive sleep apnea) -non compliant 09/19/2013  . Narcolepsy and cataplexy 09/19/2013  . Facial abscess 04/28/2013  . Facial cellulitis 04/28/2013  . Hyperlipidemia 04/28/2013  . Tobacco dependence 04/28/2013  . Sepsis ([place]) 04/28/2013  . Gout 04/28/2013  . Diastolic CHF ([place]) 24/40/1027  . Pleuritic pain 02/24/2013  . Chest pain 02/24/2013  . Murmur 02/24/2013  . Pericarditis 02/24/2013  . DM type 2 (diabetes mellitus, type 2) ([place]) 02/24/2013  . HTN (hypertension) 02/24/2013  . Cervical spondylosis with myelopathy 01/19/2011   Past Medical History:  Diagnosis Date  . Abnormal cardiovascular stress test 03/2013  . Back pain   . CHF (congestive heart failure) ([place])   . Coronary artery disease    Multivessel with significant LAD involvement by chest CT 2014  . Diastolic dysfunction   . Essential hypertension   . Lumbar herniated disc    L4-L5   . MRSA (methicillin resistant staph aureus) culture positive   . Shortness of breath dyspnea   . Sleep apnea   . Type 2 diabetes mellitus ([facility])     Family History  Problem Relation Age of Onset  . Lung cancer Mother   . Diabetes Father   . Hepatitis B Father   . Diabetes Sister   . Cushing syndrome Sister     Past Surgical History:  Procedure Laterality Date  . ANTERIOR CERVICAL DECOMP/DISCECTOMY FUSION  01/18/2011   Procedure: ANTERIOR CERVICAL DECOMPRESSION/DISCECTOMY FUSION 2 LEVELS;  Surgeon: Cooper Render Pool;  Location: [facility];  Service: Neurosurgery;  Laterality: N/A;  anterior cervical discectomy and fusion with allograft and plating, cervical five-six, cervical six-seven  . BIOPSY N/A 04/04/2015   Procedure: BIOPSY;  Surgeon: Rogene Houston, MD;  [facility];  Service: Endoscopy;  Laterality: N/A;  . Bone spur removed  1997  Left shoulder  . CARDIAC CATHETERIZATION N/A 08/08/2015   Procedure: Right/Left Heart Cath and Coronary Angiography;  Surgeon: Sherren Mocha, MD;  Location: [facility];  Service: Cardiovascular;  Laterality: N/A;  . CARPAL TUNNEL RELEASE Right   . ESOPHAGEAL DILATION N/A 04/04/2015   Procedure: ESOPHAGEAL DILATION;  Surgeon: Rogene Houston, MD;  [facility];  Service: Endoscopy;  Laterality: N/A;  . ESOPHAGOGASTRODUODENOSCOPY N/A 04/04/2015   Procedure: ESOPHAGOGASTRODUODENOSCOPY (EGD);  Surgeon: Rogene Houston, MD;  [facility];  Service: Endoscopy;  Laterality: N/A;  1240  . HEMORRHOID SURGERY     Social History   Occupational History  . Not on file  Tobacco Use  . Smoking status: Current Every Day Smoker    Packs/day: 1.00    Years: 50.00    Pack years: 50.00    Types: Cigarettes  . Smokeless tobacco: Former Systems developer  . Tobacco comment: smoking x 50 yrs  Substance and Sexual Activity  . Alcohol use: No    Alcohol/week: 0.0 standard drinks  . Drug use: No  . Sexual activity: Yes

## 2017-12-22 DIAGNOSIS — G894 Chronic pain syndrome: Secondary | ICD-10-CM | POA: Diagnosis not present

## 2017-12-22 DIAGNOSIS — G629 Polyneuropathy, unspecified: Secondary | ICD-10-CM | POA: Diagnosis not present

## 2017-12-22 DIAGNOSIS — I10 Essential (primary) hypertension: Secondary | ICD-10-CM | POA: Diagnosis not present

## 2017-12-22 DIAGNOSIS — E109 Type 1 diabetes mellitus without complications: Secondary | ICD-10-CM | POA: Diagnosis not present

## 2017-12-22 DIAGNOSIS — M5136 Other intervertebral disc degeneration, lumbar region: Secondary | ICD-10-CM | POA: Diagnosis not present

## 2018-01-11 ENCOUNTER — Ambulatory Visit (INDEPENDENT_AMBULATORY_CARE_PROVIDER_SITE_OTHER): Payer: Medicare HMO | Admitting: Surgery

## 2018-01-11 ENCOUNTER — Encounter (INDEPENDENT_AMBULATORY_CARE_PROVIDER_SITE_OTHER): Payer: Self-pay | Admitting: Surgery

## 2018-01-11 VITALS — BP 153/84 | HR 81

## 2018-01-11 DIAGNOSIS — M5416 Radiculopathy, lumbar region: Secondary | ICD-10-CM

## 2018-01-11 NOTE — Progress Notes (Signed)
[age]-year-old white male with history of L4-5 HNP comes in for preop evaluation.  States that symptoms unchanged from previous visit.  Today history and physical performed.  We will proceed with surgery as scheduled.  Procedure along with potential risk comp occasions discussed.  All questions answered.

## 2018-01-13 NOTE — Pre-Procedure Instructions (Signed)
Jaysion D Stout  01/13/2018      [facility] - [place], [place] - [address] [place] [address] Phone: [phone] Fax: [phone]  [id number] - [place], [place] [address] [place] [address] Phone: [phone] Fax: [phone]  [id number] - [place], [place] - [address] [facility] [address] [place] [address] Phone: [phone] Fax: [phone]    Your procedure is scheduled on January 23, 2018.  Report to [facility] Admitting at 1030 AM.  Call this number if you have problems the morning of surgery:  [phone]   Remember:  Do not eat or drink after midnight.    Take these medicines the morning of surgery with A SIP OF WATER  Metoprolol succinate (Toprol-XL)  Follow your surgeon's instructions on when to hold/resume aspirin.  If no instructions were given call the office to determine how they would like to you take aspirin  7 days prior to surgery STOP taking any Aleve, Naproxen, Ibuprofen, Motrin, Advil, Goody's, BC's, all herbal medications, fish oil, and all vitamins   WHAT DO I DO ABOUT MY DIABETES MEDICATION?  Marland Kitchen Do not take oral diabetes medicines (pills) the morning of surgery-metformin (glucophage).  . The day of surgery take 20-25 units of Lantus insulin (1/2 of your normal dose).  Reviewed and Endorsed by [facility] Patient Education Committee, August 2015   How to Manage Your Diabetes Before and After Surgery  Why is it important to control my blood sugar before and after surgery? . Improving blood sugar levels before and after surgery helps healing and can limit problems. . A way of improving blood sugar control is eating a healthy diet by: o  Eating less sugar and carbohydrates o  Increasing activity/exercise o  Talking with your doctor about reaching your blood sugar goals . High blood sugars (greater than  180 mg/dL) can raise your risk of infections and slow your recovery, so you will need to focus on controlling your diabetes during the weeks before surgery. . Make sure that the doctor who takes care of your diabetes knows about your planned surgery including the date and location.  How do I manage my blood sugar before surgery? . Check your blood sugar at least 4 times a day, starting 2 days before surgery, to make sure that the level is not too high or low. o Check your blood sugar the morning of your surgery when you wake up and every 2 hours until you get to the Short Stay unit. . If your blood sugar is less than 70 mg/dL, you will need to treat for low blood sugar: o Do not take insulin. o Treat a low blood sugar (less than 70 mg/dL) with  cup of clear juice (cranberry or apple), 4 glucose tablets, OR glucose gel. Recheck blood sugar in 15 minutes after treatment (to make sure it is greater than 70 mg/dL). If your blood sugar is not greater than 70 mg/dL on recheck, call [phone] o  for further instructions. . Report your blood sugar to the short stay nurse when you get to Short Stay.  . If you are admitted to the hospital after surgery: o Your blood sugar will be checked by the staff and you will probably be given insulin after surgery (instead of oral diabetes medicines) to make sure you have good blood sugar  levels. o The goal for blood sugar control after surgery is 80-180 mg/dL.   Do not wear jewelry  Do not wear lotions, powders, or colognes, or deodorant.  Men may shave face and neck.  Do not bring valuables to the hospital.  [facility] is not responsible for any belongings or valuables.  Contacts, dentures or bridgework may not be worn into surgery.  Leave your suitcase in the car.  After surgery it may be brought to your room.  For patients admitted to the hospital, discharge time will be determined by your treatment team.  Patients discharged the day of surgery will  not be allowed to drive home.    [facility]- Preparing For Surgery  Before surgery, you can play an important role. Because skin is not sterile, your skin needs to be as free of germs as possible. You can reduce the number of germs on your skin by washing with CHG (chlorahexidine gluconate) Soap before surgery.  CHG is an antiseptic cleaner which kills germs and bonds with the skin to continue killing germs even after washing.    Oral Hygiene is also important to reduce your risk of infection.  Remember - BRUSH YOUR TEETH THE MORNING OF SURGERY WITH YOUR REGULAR TOOTHPASTE  Please do not use if you have an allergy to CHG or antibacterial soaps. If your skin becomes reddened/irritated stop using the CHG.  Do not shave (including legs and underarms) for at least 48 hours prior to first CHG shower. It is OK to shave your face.  Please follow these instructions carefully.   1. Shower the NIGHT BEFORE SURGERY and the MORNING OF SURGERY with CHG.   2. If you chose to wash your hair, wash your hair first as usual with your normal shampoo.  3. After you shampoo, rinse your hair and body thoroughly to remove the shampoo.  4. Use CHG as you would any other liquid soap. You can apply CHG directly to the skin and wash gently with a scrungie or a clean washcloth.   5. Apply the CHG Soap to your body ONLY FROM THE NECK DOWN.  Do not use on open wounds or open sores. Avoid contact with your eyes, ears, mouth and genitals (private parts). Wash Face and genitals (private parts)  with your normal soap.  6. Wash thoroughly, paying special attention to the area where your surgery will be performed.  7. Thoroughly rinse your body with warm water from the neck down.  8. DO NOT shower/wash with your normal soap after using and rinsing off the CHG Soap.  9. Pat yourself dry with a CLEAN TOWEL.  10. Wear CLEAN PAJAMAS to bed the night before surgery, wear comfortable clothes the morning of  surgery  11. Place CLEAN SHEETS on your bed the night of your first shower and DO NOT SLEEP WITH PETS.  Day of Surgery:  Do not apply any deodorants/lotions.  Please wear clean clothes to the hospital/surgery center.   Remember to brush your teeth WITH YOUR REGULAR TOOTHPASTE.  Please read over the following fact sheets that you were given. Pain Booklet, Coughing and Deep Breathing, MRSA Information and Surgical Site Infection Prevention

## 2018-01-15 DIAGNOSIS — R69 Illness, unspecified: Secondary | ICD-10-CM | POA: Diagnosis not present

## 2018-01-16 ENCOUNTER — Other Ambulatory Visit: Payer: Self-pay

## 2018-01-16 ENCOUNTER — Encounter (HOSPITAL_COMMUNITY): Payer: Self-pay

## 2018-01-16 ENCOUNTER — Encounter (HOSPITAL_COMMUNITY)
Admission: RE | Admit: 2018-01-16 | Discharge: 2018-01-16 | Disposition: A | Discharge status: Home / Self Care | Payer: Medicare HMO | Source: Ambulatory Visit | Attending: Orthopaedic Surgery | Admitting: Orthopaedic Surgery

## 2018-01-16 DIAGNOSIS — Z79899 Other long term (current) drug therapy: Secondary | ICD-10-CM | POA: Diagnosis not present

## 2018-01-16 DIAGNOSIS — I110 Hypertensive heart disease with heart failure: Secondary | ICD-10-CM | POA: Insufficient documentation

## 2018-01-16 DIAGNOSIS — M48061 Spinal stenosis, lumbar region without neurogenic claudication: Secondary | ICD-10-CM | POA: Diagnosis not present

## 2018-01-16 DIAGNOSIS — Z794 Long term (current) use of insulin: Secondary | ICD-10-CM | POA: Diagnosis not present

## 2018-01-16 DIAGNOSIS — I5030 Unspecified diastolic (congestive) heart failure: Secondary | ICD-10-CM | POA: Insufficient documentation

## 2018-01-16 DIAGNOSIS — Z981 Arthrodesis status: Secondary | ICD-10-CM | POA: Insufficient documentation

## 2018-01-16 DIAGNOSIS — I350 Nonrheumatic aortic (valve) stenosis: Secondary | ICD-10-CM | POA: Diagnosis not present

## 2018-01-16 DIAGNOSIS — Z7982 Long term (current) use of aspirin: Secondary | ICD-10-CM | POA: Diagnosis not present

## 2018-01-16 DIAGNOSIS — Z01818 Encounter for other preprocedural examination: Secondary | ICD-10-CM | POA: Insufficient documentation

## 2018-01-16 DIAGNOSIS — I2510 Atherosclerotic heart disease of native coronary artery without angina pectoris: Secondary | ICD-10-CM | POA: Insufficient documentation

## 2018-01-16 DIAGNOSIS — F17200 Nicotine dependence, unspecified, uncomplicated: Secondary | ICD-10-CM | POA: Diagnosis not present

## 2018-01-16 DIAGNOSIS — E119 Type 2 diabetes mellitus without complications: Secondary | ICD-10-CM | POA: Diagnosis not present

## 2018-01-16 DIAGNOSIS — R69 Illness, unspecified: Secondary | ICD-10-CM | POA: Diagnosis not present

## 2018-01-16 DIAGNOSIS — M5126 Other intervertebral disc displacement, lumbar region: Secondary | ICD-10-CM | POA: Insufficient documentation

## 2018-01-16 DIAGNOSIS — G4733 Obstructive sleep apnea (adult) (pediatric): Secondary | ICD-10-CM | POA: Diagnosis not present

## 2018-01-16 HISTORY — DX: Major depressive disorder, single episode, unspecified: F32.9

## 2018-01-16 HISTORY — DX: Unspecified osteoarthritis, unspecified site: M19.90

## 2018-01-16 HISTORY — DX: Depression, unspecified: F32.A

## 2018-01-16 HISTORY — DX: Anxiety disorder, unspecified: F41.9

## 2018-01-16 LAB — CBC
HCT: 55.5 % — ABNORMAL HIGH (ref 39.0–52.0)
Hemoglobin: 17.8 g/dL — ABNORMAL HIGH (ref 13.0–17.0)
MCH: 31 pg (ref 26.0–34.0)
MCHC: 32.1 g/dL (ref 30.0–36.0)
MCV: 96.7 fL (ref 80.0–100.0)
PLATELETS: 236 10*3/uL (ref 150–400)
RBC: 5.74 MIL/uL (ref 4.22–5.81)
RDW: 12.9 % (ref 11.5–15.5)
WBC: 10.5 10*3/uL (ref 4.0–10.5)
nRBC: 0 % (ref 0.0–0.2)

## 2018-01-16 LAB — URINALYSIS, ROUTINE W REFLEX MICROSCOPIC
Bilirubin Urine: NEGATIVE
GLUCOSE, UA: NEGATIVE mg/dL
HGB URINE DIPSTICK: NEGATIVE
Ketones, ur: NEGATIVE mg/dL
Leukocytes, UA: NEGATIVE
Nitrite: NEGATIVE
PH: 7 (ref 5.0–8.0)
PROTEIN: NEGATIVE mg/dL
Specific Gravity, Urine: 1.008 (ref 1.005–1.030)

## 2018-01-16 LAB — GLUCOSE, CAPILLARY: GLUCOSE-CAPILLARY: 244 mg/dL — AB (ref 70–99)

## 2018-01-16 LAB — SURGICAL PCR SCREEN
MRSA, PCR: NEGATIVE
Staphylococcus aureus: NEGATIVE

## 2018-01-16 LAB — HEMOGLOBIN A1C
Hgb A1c MFr Bld: 9.5 % — ABNORMAL HIGH (ref 4.8–5.6)
Mean Plasma Glucose: 225.95 mg/dL

## 2018-01-16 NOTE — Progress Notes (Signed)
PCP - Dr. Octavio Graves  Cardiologist - Dr. Carlyle Dolly  Chest x-ray - Denies  EKG - 08/15/17 (E)  Stress Test - 03/2013 (E)  ECHO - 07/2015 (E)  Cardiac Cath - 07/2015 (E)  AICD- na PM- na LOOP- na  Sleep Study - Yes- Positive CPAP - None  LABS- 01/16/18: CBC, CMP, PT, PCR, UA  ASA- LD- 11/4  HA1C- 01/16/18 Fasting Blood Sugar - 140-190, today 244 Checks Blood Sugar __2___ times a day  Anesthesia- Yes- cardiac history  Pt denies having chest pain, sob, or fever at this time. All instructions explained to the pt, with a verbal understanding of the material. Pt agrees to go over the instructions while at home for a better understanding. The opportunity to ask questions was provided.

## 2018-01-17 ENCOUNTER — Encounter (HOSPITAL_COMMUNITY): Payer: Self-pay | Admitting: Physician Assistant

## 2018-01-17 NOTE — Progress Notes (Signed)
Anesthesia Chart Review:  Case:  [id number] Date/Time:  01/23/18 1215   Procedure:  DECOMPRESSION L4-5 WITH MICRODISCECTOMY (N/A )   Anesthesia type:  General   Pre-op diagnosis:  stenosis left L4-5 herniated nucleus pulposus extruded fragment   [facility] ROOM 08 / [facility]   Surgeon:  Marybelle Killings, MD      DISCUSSION: 67 yo male current smoker. Pertinent hx includes IDDMII, HTN, Diastolic HF, Aortic stenosis (mild-mod by echo 2017), Mod nonobstructive CAD by cath 2017, OSA not on CPAP.  Pt follows with Dr. Harl Bowie for cardiac care. Per his last OV note 08/15/2017 pt with no symptoms of CAD or CHF. He does note that he has severe OSA not currently being managed, referred to Dr. Luan Pulling.  Cardiac history summarized in Dr. Nelly Laurence note as follows:  CAD - CT scan chest 02/2013 with 3 vessel CAD, severe LAD disease. - Jan 2015 nuclear stress without clear ischemia - admit 07/2015 with acute pulmonary edema in setting of severe HTN. Referred for cath.  - cath 07/2015 with 50% LAD disease, overall patent coronaries - his presentation with pulmonary edema likely secondary to severe HTN in setting of restrictive diastolic dysfunction. - echo 07/2015 LVEF 45-50%, restrictive diastolic dysfunction - no recent chest pain. No SOB/DOE - compliant with meds. No recent edema.   Aortic stenosis - echo 07/2015 mild to moderate. Mean gradient 16, AVA reported at 0.9 with dimensionless index 0.3 - denies any recent symptoms  Chronic diastolic HF - echo 03/7508 with restrictive diastolic dysfunction. Low normal to mildly decreased LVEF at 45-50%.  - no recent edema, no SOB/DOE  He was recommended 6 month f/u and repeat monitoring echo, which has not been done yet.  Discussed case with Dr. Marcell Barlow. Due to length of time since last echo to evaluate AS he advised it would be best to have Dr. Nelly Laurence input prior to surgery. Debbie at Dr. Narda Amber office notified.  Received call from [facility] stating they  are cancelling surgery as pt not cleared by PCP due to uncontrolled DMII. She will work on getting cardiac clearance in the event the case gets rescheduled.  VS: BP (!) 157/72   Pulse 73   Temp 36.8 C   Resp 20   Ht 5\' 7"  (1.702 m)   Wt 95 kg   SpO2 97%   BMI 32.80 kg/m   PROVIDERS: Octavio Graves, DO is PCP  Carlyle Dolly, MD is Cardiologist  LABS: Labs reviewed: Acceptable for surgery. Poorly controlled DMII. (all labs ordered are listed, but only abnormal results are displayed)  Labs Reviewed  GLUCOSE, CAPILLARY - Abnormal; Notable for the following components:      Result Value   Glucose-Capillary 244 (*)    All other components within normal limits  CBC - Abnormal; Notable for the following components:   Hemoglobin 17.8 (*)    HCT 55.5 (*)    All other components within normal limits  URINALYSIS, ROUTINE W REFLEX MICROSCOPIC - Abnormal; Notable for the following components:   Color, Urine STRAW (*)    All other components within normal limits  HEMOGLOBIN A1C - Abnormal; Notable for the following components:   Hgb A1c MFr Bld 9.5 (*)    All other components within normal limits  SURGICAL PCR SCREEN    EKG: 08/15/2017: Sinus  Rhythm, Frequent pvcs -ventricular bigeminy, Poor R-wave progression -nonspecific -consider old anterior infarct, Nonspecific ST depression  -Nondiagnostic.   CV: Carotid US 06/29/2016: IMPRESSION: 1. Mild  diffuse bilateral common carotid, carotid bifurcation, proximal ICA sclerotic vascular disease. No flow limiting stenosis. Degree of stenosis less than 50% bilaterally.  2. Vertebral arteries are patent antegrade flow.  Cath 07/2015:  Prox LAD to Mid LAD lesion, 50% stenosed.  1. Calcified moderate proximal-mid LAD stenosis 2. Widely patent, dominant LCx 3. Preserved cardiac output, mildly elevated right-sided intracardiac pressures  Recommend: Medical therapy for nonobstructive CAD and CHF  TTE 07/2015:  Study  Conclusions  - Left ventricle: The cavity size was normal. Wall thickness was  increased in a pattern of mild LVH. Systolic function was mildly  reduced. The estimated ejection fraction was in the range of 45%  to 50%. Diffuse hypokinesis. Doppler parameters are consistent  with restrictive physiology, indicative of decreased left  ventricular diastolic compliance and/or increased left atrial  pressure. Doppler parameters are consistent with high ventricular  filling pressure. - Aortic valve: Valve mobility was restricted. There was mild to  moderate stenosis. Valve area (VTI): 0.96 cm^2. Valve area  (Vmax): 0.91 cm^2. Valve area (Vmean): 0.89 cm^2. - Mitral valve: Calcified annulus. There was mild regurgitation. - Left atrium: The atrium was moderately dilated. - Right atrium: The atrium was mildly dilated. - Atrial septum: There was a patent foramen ovale.  Impressions:  - Mild global reduction in LV function; restrictive filling with  elevated filling pressure; mild LVH; biatrial enlargement;  heavily calcified aortic valve with probable mild to moderate AS  (mean gradient 16 mmHg; calculated AVA .9 cm2 likely  overestimates severity; dimensionless index .3 not supportive of  severe AS); mild MR; patent foramen ovale.  Past Medical History:  Diagnosis Date  . Abnormal cardiovascular stress test 03/2013  . Anxiety   . Arthritis   . Back pain   . CHF (congestive heart failure) ([place])   . Coronary artery disease    Multivessel with significant LAD involvement by chest CT 2014  . Depression   . Diastolic dysfunction   . Essential hypertension   . Lumbar herniated disc    L4-L5  . MRSA (methicillin resistant staph aureus) culture positive   . Shortness of breath dyspnea   . Sleep apnea    No cpap  . Type 2 diabetes mellitus ([place])     Past Surgical History:  Procedure Laterality Date  . ANTERIOR CERVICAL DECOMP/DISCECTOMY FUSION  01/18/2011   Procedure:  ANTERIOR CERVICAL DECOMPRESSION/DISCECTOMY FUSION 2 LEVELS;  Surgeon: Cooper Render Pool;  Location: [facility];  Service: Neurosurgery;  Laterality: N/A;  anterior cervical discectomy and fusion with allograft and plating, cervical five-six, cervical six-seven  . BIOPSY N/A 04/04/2015   Procedure: BIOPSY;  Surgeon: Rogene Houston, MD;  [facility];  Service: Endoscopy;  Laterality: N/A;  . Bone spur removed  1997   Left shoulder  . CARDIAC CATHETERIZATION N/A 08/08/2015   Procedure: Right/Left Heart Cath and Coronary Angiography;  Surgeon: Sherren Mocha, MD;  Location: [facility];  Service: Cardiovascular;  Laterality: N/A;  . CARPAL TUNNEL RELEASE Right   . ESOPHAGEAL DILATION N/A 04/04/2015   Procedure: ESOPHAGEAL DILATION;  Surgeon: Rogene Houston, MD;  [facility];  Service: Endoscopy;  Laterality: N/A;  . ESOPHAGOGASTRODUODENOSCOPY N/A 04/04/2015   Procedure: ESOPHAGOGASTRODUODENOSCOPY (EGD);  Surgeon: Rogene Houston, MD;  [facility];  Service: Endoscopy;  Laterality: N/A;  1240  . HEMORRHOID SURGERY    . TONSILLECTOMY      MEDICATIONS: . aspirin EC 81 MG tablet  . atorvastatin (LIPITOR)  80 MG tablet  . CALCIUM-MAGNESIUM-ZINC PO  . cyclobenzaprine (FLEXERIL) 10 MG tablet  . diphenhydrAMINE (BENADRYL) 25 MG tablet  . Flaxseed, Linseed, (FLAXSEED OIL PO)  . furosemide (LASIX) 40 MG tablet  . gabapentin (NEURONTIN) 300 MG capsule  . ibuprofen (ADVIL,MOTRIN) 200 MG tablet  . insulin glargine (LANTUS) 100 UNIT/ML injection  . lisinopril (PRINIVIL,ZESTRIL) 5 MG tablet  . metFORMIN (GLUCOPHAGE) 500 MG tablet  . metoprolol succinate (TOPROL-XL) 25 MG 24 hr tablet  . Multiple Vitamin (MULTIVITAMIN WITH MINERALS) TABS  . nitroGLYCERIN (NITROSTAT) 0.4 MG SL tablet  . testosterone cypionate (DEPOTESTOSTERONE CYPIONATE) 200 MG/ML injection  . testosterone cypionate (DEPOTESTOTERONE CYPIONATE) 100 MG/ML injection  . zolpidem (AMBIEN) 10 MG tablet    No current facility-administered medications for this encounter.     Wynonia Musty [facility] Short Stay Center/Anesthesiology Phone [phone] 01/17/2018 2:53 PM

## 2018-01-23 ENCOUNTER — Ambulatory Visit (HOSPITAL_COMMUNITY): Admission: RE | Admit: 2018-01-23 | Payer: Medicare HMO | Source: Ambulatory Visit | Admitting: Orthopaedic Surgery

## 2018-01-23 ENCOUNTER — Encounter (HOSPITAL_COMMUNITY): Admission: RE | Payer: Self-pay | Source: Ambulatory Visit

## 2018-01-23 SURGERY — LUMBAR LAMINECTOMY/DECOMPRESSION MICRODISCECTOMY
Anesthesia: General

## 2018-02-02 ENCOUNTER — Inpatient Hospital Stay (INDEPENDENT_AMBULATORY_CARE_PROVIDER_SITE_OTHER): Payer: Medicare HMO | Admitting: Orthopaedic Surgery

## 2018-03-21 ENCOUNTER — Encounter: Payer: Self-pay | Admitting: Cardiology

## 2018-03-21 ENCOUNTER — Ambulatory Visit: Payer: Medicare HMO | Admitting: Cardiology

## 2018-03-21 VITALS — BP 132/60 | HR 78 | Ht 67.0 in | Wt 205.0 lb

## 2018-03-21 DIAGNOSIS — I2510 Atherosclerotic heart disease of native coronary artery without angina pectoris: Secondary | ICD-10-CM

## 2018-03-21 DIAGNOSIS — I10 Essential (primary) hypertension: Secondary | ICD-10-CM

## 2018-03-21 DIAGNOSIS — E782 Mixed hyperlipidemia: Secondary | ICD-10-CM | POA: Diagnosis not present

## 2018-03-21 DIAGNOSIS — I350 Nonrheumatic aortic (valve) stenosis: Secondary | ICD-10-CM

## 2018-03-21 DIAGNOSIS — Z01810 Encounter for preprocedural cardiovascular examination: Secondary | ICD-10-CM

## 2018-03-21 NOTE — Patient Instructions (Signed)

## 2018-03-21 NOTE — Progress Notes (Signed)
Clinical Summary Jonathan Gilmore is a 68 y.o.male seen today for follow up of the following medical problems.   1. CAD - CT scan chest 02/2013 with 3 vessel CAD, severe LAD disease. - Jan 2015 nuclear stress without clear ischemia - admit 07/2015 with acute pulmonary edema in setting of severe HTN. Referred for cath.  - cath 07/2015 with 50% LAD disease, overall patent coronaries - his presentation with pulmonary edema likely secondary to severe HTN in setting of restrictive diastolic dysfunction. - echo 07/2015 LVEF 45-50%, restrictive diastolic dysfunction    - no recent chest pain. No SOB/DOE - does heavy yardwork regularly without symptoms. - can walk up flight of stairs without troubles.   2. Severe OSA - not using CPAP machine due to discomfort. Stopped several years ago - last visit referred to Dr Luan Pulling.  3. Hyperlipidemia - 07/2017 TC 169 TG 294 HDL 28 LDL 82 - compliant with statin. Elevated TGs in setting of poorly controlled DM2  4. Aortic stenosis - echo 07/2015 mild to moderate. Mean gradient 16, AVA reported at 0.9 with dimensionless index 0.3 - denies any recent symptoms  5. HTN - he is compliant with meds  6. Chronic diastolic HF - echo 0/3009 with restrictive diastolic dysfunction. Low normal to mildly decreased LVEF at 45-50%.  - no recent symptoms, no edema  7. DM2 - followed by pcp   8. Claudication/PAD - followed by vascular   9. Preoperative evaluation - being considered for back surgery     SH: his wife, Jonathan Gilmore is also a patient of mine.   Past Medical History:  Diagnosis Date  . Abnormal cardiovascular stress test 03/2013  . Anxiety   . Arthritis   . Back pain   . CHF (congestive heart failure) ([place])   . Coronary artery disease    Multivessel with significant LAD involvement by chest CT 2014  . Depression   . Diastolic dysfunction   . Essential hypertension   . Lumbar herniated disc    L4-L5  . MRSA  (methicillin resistant staph aureus) culture positive   . Shortness of breath dyspnea   . Sleep apnea    No cpap  . Type 2 diabetes mellitus (H[facility])      Allergies  Allergen Reactions  . Morphine And Related Nausea And Vomiting     Current Outpatient Medications  Medication Sig Dispense Refill  . aspirin EC 81 MG tablet Take 81 mg by mouth daily.    Marland Kitchen atorvastatin (LIPITOR) 80 MG tablet TAKE 1 TABLET (80 MG TOTAL) BY MOUTH DAILY AT 6 PM. (Patient taking differently: Take 80 mg by mouth at bedtime. ) 90 tablet 2  . CALCIUM-MAGNESIUM-ZINC PO Take 1 tablet by mouth daily.    . cyclobenzaprine (FLEXERIL) 10 MG tablet Take 1 tablet (10 mg total) by mouth 2 (two) times daily as needed for muscle spasms. (Patient not taking: Reported on 01/10/2018) 20 tablet 0  . diphenhydrAMINE (BENADRYL) 25 MG tablet Take 100 mg by mouth at bedtime.    . Flaxseed, Linseed, (FLAXSEED OIL PO) Take 1 tablet by mouth daily.     . furosemide (LASIX) 40 MG tablet TAKE 1 TABLET BY MOUTH EVERY DAY (Patient taking differently: Take 40 mg by mouth daily. ) 90 tablet 3  . gabapentin (NEURONTIN) 300 MG capsule Take 1 capsule (300 mg total) by mouth at bedtime. (Patient taking differently: Take 600 mg by mouth at bedtime. ) 30 capsule 0  . ibuprofen (ADVIL,MOTRIN)  200 MG tablet Take 800 mg by mouth daily as needed for moderate pain.    Marland Kitchen insulin glargine (LANTUS) 100 UNIT/ML injection Inject 40-50 Units into the skin daily.     Marland Kitchen lisinopril (PRINIVIL,ZESTRIL) 5 MG tablet Take 1 tablet (5 mg total) by mouth daily. 90 tablet 3  . metFORMIN (GLUCOPHAGE) 500 MG tablet Take 1 tablet (500 mg total) by mouth 2 (two) times daily.    . metoprolol succinate (TOPROL-XL) 25 MG 24 hr tablet TAKE 1 TABLET BY MOUTH EVERY DAY (Patient taking differently: Take 25 mg by mouth at bedtime. ) 90 tablet 3  . Multiple Vitamin (MULTIVITAMIN WITH MINERALS) TABS Take 1 tablet by mouth daily.    . nitroGLYCERIN (NITROSTAT) 0.4 MG SL tablet Place 1  tablet (0.4 mg total) under the tongue every 5 (five) minutes as needed for chest pain. 25 tablet 4  . testosterone cypionate (DEPOTESTOSTERONE CYPIONATE) 200 MG/ML injection Inject 200 mg into the muscle every 14 (fourteen) days.   5  . testosterone cypionate (DEPOTESTOTERONE CYPIONATE) 100 MG/ML injection Inject 100 mg into the muscle every 28 (twenty-eight) days. For IM use only    . zolpidem (AMBIEN) 10 MG tablet Take 10 mg by mouth at bedtime as needed.  3   No current facility-administered medications for this visit.      Past Surgical History:  Procedure Laterality Date  . ANTERIOR CERVICAL DECOMP/DISCECTOMY FUSION  01/18/2011   Procedure: ANTERIOR CERVICAL DECOMPRESSION/DISCECTOMY FUSION 2 LEVELS;  Surgeon: Cooper Render Pool;  Location: [facility];  Service: Neurosurgery;  Laterality: N/A;  anterior cervical discectomy and fusion with allograft and plating, cervical five-six, cervical six-seven  . BIOPSY N/A 04/04/2015   Procedure: BIOPSY;  Surgeon: Rogene Houston, MD;  [facility];  Service: Endoscopy;  Laterality: N/A;  . Bone spur removed  1997   Left shoulder  . CARDIAC CATHETERIZATION N/A 08/08/2015   Procedure: Right/Left Heart Cath and Coronary Angiography;  Surgeon: Sherren Mocha, MD;  Location: [facility];  Service: Cardiovascular;  Laterality: N/A;  . CARPAL TUNNEL RELEASE Right   . ESOPHAGEAL DILATION N/A 04/04/2015   Procedure: ESOPHAGEAL DILATION;  Surgeon: Rogene Houston, MD;  [facility];  Service: Endoscopy;  Laterality: N/A;  . ESOPHAGOGASTRODUODENOSCOPY N/A 04/04/2015   Procedure: ESOPHAGOGASTRODUODENOSCOPY (EGD);  Surgeon: Rogene Houston, MD;  [facility];  Service: Endoscopy;  Laterality: N/A;  1240  . HEMORRHOID SURGERY    . TONSILLECTOMY       Allergies  Allergen Reactions  . Morphine And Related Nausea And Vomiting      Family History  Problem Relation Age of Onset  . Lung cancer Mother   . Diabetes Father     . Hepatitis B Father   . Diabetes Sister   . Cushing syndrome Sister      Social History Jonathan Gilmore reports that he has been smoking cigarettes. He has a 50.00 pack-year smoking history. He has quit using smokeless tobacco. Mr. Prime reports no history of alcohol use.   Review of Systems CONSTITUTIONAL: No weight loss, fever, chills, weakness or fatigue.  HEENT: Eyes: No visual loss, blurred vision, double vision or yellow sclerae.No hearing loss, sneezing, congestion, runny nose or sore throat.  SKIN: No rash or itching.  CARDIOVASCULAR: per hpi RESPIRATORY: No shortness of breath, cough or sputum.  GASTROINTESTINAL: No anorexia, nausea, vomiting or diarrhea. No abdominal pain or blood.  GENITOURINARY: No burning on urination, no polyuria NEUROLOGICAL: No  headache, dizziness, syncope, paralysis, ataxia, numbness or tingling in the extremities. No change in bowel or bladder control.  MUSCULOSKELETAL: No muscle, back pain, joint pain or stiffness.  LYMPHATICS: No enlarged nodes. No history of splenectomy.  PSYCHIATRIC: No history of depression or anxiety.  ENDOCRINOLOGIC: No reports of sweating, cold or heat intolerance. No polyuria or polydipsia.  Marland Kitchen   Physical Examination Vitals:   03/21/18 1120  BP: 132/60  Pulse: 78  SpO2: 95%   Vitals:   03/21/18 1120  Weight: 205 lb (93 kg)  Height: 5\' 7"  (1.702 m)    Gen: resting comfortably, no acute distress HEENT: no scleral icterus, pupils equal round and reactive, no palptable cervical adenopathy,  CV: RRR, 3/6 systoilc murmur rusb, no jvd Resp: Clear to auscultation bilaterally GI: abdomen is soft, non-tender, non-distended, normal bowel sounds, no hepatosplenomegaly MSK: extremities are warm, no edema.  Skin: warm, no rash Neuro:  no focal deficits Psych: appropriate affect   Diagnostic Studies Jan 2015 MPI IMPRESSION: 1. Abnormal exercise myocardial perfusion imaging stress test  2. Inferior wall defect likely due  to subdiaphragmatic attenuation, its wall motion is similar to other areas without a perfusion defect.  3. Likely small anteroseptal infract, this area is more hypokinetic than the rest of the myocardium  3. Low left ventricular systolic function with global hypokinesis  4. Mildly reduced functional capacity (90% of age and gender predicted)  5. Overall increased risk for major cardiac events based on low ejection fraction. There is no current myocardium at jeopardy.   02/2013 echo Study Conclusions  - Left ventricle: The cavity size was normal. There was mild concentric hypertrophy. Systolic function was normal. The estimated ejection fraction was in the range of 55% to 60%. Wall motion was normal; there were no regional wall motion abnormalities. There is mildly asynchronous contraction consistent with intraventricular conduction delay (IVCD) or bundle Shaaron Golliday block. Doppler parameters are consistent with abnormal left ventricular relaxation (grade 1 diastolic dysfunction). - Aortic valve: Cusp separation was mildly reduced. There was mild to moderate stenosis. - Left atrium: The atrium was moderately dilated.  07/2015 echo  Study Conclusions  - Left ventricle: The cavity size was normal. Wall thickness was  increased in a pattern of mild LVH. Systolic function was mildly  reduced. The estimated ejection fraction was in the range of 45%  to 50%. Diffuse hypokinesis. Doppler parameters are consistent  with restrictive physiology, indicative of decreased left  ventricular diastolic compliance and/or increased left atrial  pressure. Doppler parameters are consistent with high ventricular  filling pressure. - Aortic valve: Valve mobility was restricted. There was mild to  moderate stenosis. Valve area (VTI): 0.96 cm^2. Valve area  (Vmax): 0.91 cm^2. Valve area (Vmean): 0.89 cm^2. - Mitral valve: Calcified annulus. There was mild  regurgitation. - Left atrium: The atrium was moderately dilated. - Right atrium: The atrium was mildly dilated. - Atrial septum: There was a patent foramen ovale.  Impressions:  - Mild global reduction in LV function; restrictive filling with  elevated filling pressure; mild LVH; biatrial enlargement;  heavily calcified aortic valve with probable mild to moderate AS  (mean gradient 16 mmHg; calculated AVA .9 cm2 likely  overestimates severity; dimensionless index .3 not supportive of  severe AS); mild MR; patent foramen ovale.  07/2015 Cath  Prox LAD to Mid LAD lesion, 50% stenosed.  1. Calcified moderate proximal-mid LAD stenosis 2. Widely patent, dominant LCx 3. Preserved cardiac output, mildly elevated right-sided intracardiac pressures  Recommend: Medical therapy  for nonobstructive CAD and CHF  01/2016 AAA [place] Screen No aneurysm    Assessment and Plan  1. CAD - moderate nonobstructive disease by prior cath. - denies any recent symptoms, continue current meds   2. Hyperlipidemia -continue statin, reasonable LDL. If peristently above 70 may consider changing to crestor in the future.    4. Aortic stenosis - mild to moderate by most recent echo -repeat echo for continued surveillance.   5. HTN - at goal, continue current meds  7. Chronic diasotlic heart failure - no symptoms and currently euvolemic, continue to monitor  8. Preoperative evaluation - tolerates greater than 4METs regularly without limitation - follow up his surveillance echo for his history of aortic stenosis, if stable findings would plan to proceed with surgery if still needed  F/u 6 months   F/u 6 months. Request pcp labs      Arnoldo Lenis, M.D.

## 2018-03-28 ENCOUNTER — Ambulatory Visit (HOSPITAL_COMMUNITY): Payer: Medicare HMO | Attending: Cardiology

## 2018-04-03 DIAGNOSIS — R69 Illness, unspecified: Secondary | ICD-10-CM | POA: Diagnosis not present

## 2018-04-05 ENCOUNTER — Ambulatory Visit (HOSPITAL_COMMUNITY)
Admission: RE | Admit: 2018-04-05 | Discharge: 2018-04-05 | Disposition: A | Discharge status: Home / Self Care | Payer: Medicare HMO | Source: Ambulatory Visit | Attending: *Deleted | Admitting: *Deleted

## 2018-04-05 DIAGNOSIS — I350 Nonrheumatic aortic (valve) stenosis: Secondary | ICD-10-CM | POA: Diagnosis not present

## 2018-04-05 NOTE — Progress Notes (Signed)
*  PRELIMINARY RESULTS* Echocardiogram 2D Echocardiogram has been performed.  Jonathan Gilmore 04/05/2018, 1:29 PM

## 2018-04-07 ENCOUNTER — Telehealth: Payer: Self-pay | Admitting: *Deleted

## 2018-04-07 NOTE — Telephone Encounter (Signed)
-----   Message from Arnoldo Lenis, MD sent at 04/07/2018 10:52 AM EST ----- Echo overall is stable, his aortic valve remains mild to moderately stiff. This is not of concern at this time and just something we will continue to monitor, and would not prohibit him from his surgery if he decides to proceed   J BrancH MD

## 2018-04-07 NOTE — Telephone Encounter (Signed)
Patient informed. Copy sent to PCP °

## 2018-04-17 DIAGNOSIS — I7090 Unspecified atherosclerosis: Secondary | ICD-10-CM | POA: Diagnosis not present

## 2018-04-17 DIAGNOSIS — Z1211 Encounter for screening for malignant neoplasm of colon: Secondary | ICD-10-CM | POA: Diagnosis not present

## 2018-04-17 DIAGNOSIS — E2740 Unspecified adrenocortical insufficiency: Secondary | ICD-10-CM | POA: Diagnosis not present

## 2018-04-17 DIAGNOSIS — I10 Essential (primary) hypertension: Secondary | ICD-10-CM | POA: Diagnosis not present

## 2018-04-17 DIAGNOSIS — E782 Mixed hyperlipidemia: Secondary | ICD-10-CM | POA: Diagnosis not present

## 2018-04-17 DIAGNOSIS — R69 Illness, unspecified: Secondary | ICD-10-CM | POA: Diagnosis not present

## 2018-04-17 DIAGNOSIS — E109 Type 1 diabetes mellitus without complications: Secondary | ICD-10-CM | POA: Diagnosis not present

## 2018-05-20 DIAGNOSIS — R69 Illness, unspecified: Secondary | ICD-10-CM | POA: Diagnosis not present

## 2018-05-27 ENCOUNTER — Other Ambulatory Visit: Payer: Self-pay | Admitting: Cardiology

## 2018-06-02 ENCOUNTER — Other Ambulatory Visit: Payer: Self-pay | Admitting: Cardiology

## 2018-06-04 ENCOUNTER — Other Ambulatory Visit: Payer: Self-pay | Admitting: Cardiology

## 2018-07-15 DIAGNOSIS — G8929 Other chronic pain: Secondary | ICD-10-CM | POA: Diagnosis not present

## 2018-07-15 DIAGNOSIS — R69 Illness, unspecified: Secondary | ICD-10-CM | POA: Diagnosis not present

## 2018-07-15 DIAGNOSIS — I110 Hypertensive heart disease with heart failure: Secondary | ICD-10-CM | POA: Diagnosis not present

## 2018-07-15 DIAGNOSIS — E1142 Type 2 diabetes mellitus with diabetic polyneuropathy: Secondary | ICD-10-CM | POA: Diagnosis not present

## 2018-07-15 DIAGNOSIS — E1151 Type 2 diabetes mellitus with diabetic peripheral angiopathy without gangrene: Secondary | ICD-10-CM | POA: Diagnosis not present

## 2018-07-15 DIAGNOSIS — E785 Hyperlipidemia, unspecified: Secondary | ICD-10-CM | POA: Diagnosis not present

## 2018-07-15 DIAGNOSIS — Z794 Long term (current) use of insulin: Secondary | ICD-10-CM | POA: Diagnosis not present

## 2018-07-15 DIAGNOSIS — E291 Testicular hypofunction: Secondary | ICD-10-CM | POA: Diagnosis not present

## 2018-07-15 DIAGNOSIS — I509 Heart failure, unspecified: Secondary | ICD-10-CM | POA: Diagnosis not present

## 2018-07-15 DIAGNOSIS — M519 Unspecified thoracic, thoracolumbar and lumbosacral intervertebral disc disorder: Secondary | ICD-10-CM | POA: Diagnosis not present

## 2018-09-27 ENCOUNTER — Ambulatory Visit (INDEPENDENT_AMBULATORY_CARE_PROVIDER_SITE_OTHER): Payer: Medicare HMO | Admitting: Orthopedic Surgery

## 2018-09-27 ENCOUNTER — Other Ambulatory Visit: Payer: Self-pay

## 2018-09-27 ENCOUNTER — Encounter: Payer: Self-pay | Admitting: Orthopedic Surgery

## 2018-09-27 VITALS — BP 140/71 | HR 78 | Temp 97.9°F | Ht 67.0 in

## 2018-09-27 DIAGNOSIS — M25562 Pain in left knee: Secondary | ICD-10-CM

## 2018-09-27 NOTE — Progress Notes (Signed)
NEW PROBLEM OFFICE VISIT  Chief Complaint  Patient presents with  . Knee Pain    Left knee pain states stepped in a hole 2 weeks ago and still having pain    68 year old male stepped in a hole last week felt pain however is gotten better and Friday was dancing he says it does not hurt him     Review of Systems  All other systems reviewed and are negative.    Past Medical History:  Diagnosis Date  . Abnormal cardiovascular stress test 03/2013  . Anxiety   . Arthritis   . Back pain   . CHF (congestive heart failure) ([place])   . Coronary artery disease    Multivessel with significant LAD involvement by chest CT 2014  . Depression   . Diastolic dysfunction   . Essential hypertension   . Lumbar herniated disc    L4-L5  . MRSA (methicillin resistant staph aureus) culture positive   . Shortness of breath dyspnea   . Sleep apnea    No cpap  . Type 2 diabetes mellitus ([place])     Past Surgical History:  Procedure Laterality Date  . ANTERIOR CERVICAL DECOMP/DISCECTOMY FUSION  01/18/2011   Procedure: ANTERIOR CERVICAL DECOMPRESSION/DISCECTOMY FUSION 2 LEVELS;  Surgeon: Cooper Render Pool;  Location: [facility];  Service: Neurosurgery;  Laterality: N/A;  anterior cervical discectomy and fusion with allograft and plating, cervical five-six, cervical six-seven  . BIOPSY N/A 04/04/2015   Procedure: BIOPSY;  Surgeon: Rogene Houston, MD;  [facility];  Service: Endoscopy;  Laterality: N/A;  . Bone spur removed  1997   Left shoulder  . CARDIAC CATHETERIZATION N/A 08/08/2015   Procedure: Right/Left Heart Cath and Coronary Angiography;  Surgeon: Sherren Mocha, MD;  Location: [facility];  Service: Cardiovascular;  Laterality: N/A;  . CARPAL TUNNEL RELEASE Right   . ESOPHAGEAL DILATION N/A 04/04/2015   Procedure: ESOPHAGEAL DILATION;  Surgeon: Rogene Houston, MD;  [facility];  Service: Endoscopy;  Laterality: N/A;  . ESOPHAGOGASTRODUODENOSCOPY N/A 04/04/2015   Procedure: ESOPHAGOGASTRODUODENOSCOPY (EGD);  Surgeon: Rogene Houston, MD;  [facility];  Service: Endoscopy;  Laterality: N/A;  1240  . HEMORRHOID SURGERY    . TONSILLECTOMY      Family History  Problem Relation Age of Onset  . Lung cancer Mother   . Diabetes Father   . Hepatitis B Father   . Diabetes Sister   . Cushing syndrome Sister    Social History   Tobacco Use  . Smoking status: Current Every Day Smoker    Packs/day: 1.00    Years: 50.00    Pack years: 50.00    Types: Cigarettes  . Smokeless tobacco: Former Systems developer  . Tobacco comment: smoking x 50 yrs  Substance Use Topics  . Alcohol use: No    Alcohol/week: 0.0 standard drinks  . Drug use: No    Allergies  Allergen Reactions  . Morphine And Related Nausea And Vomiting    Current Meds  Medication Sig  . aspirin EC 81 MG tablet Take 81 mg by mouth daily.  Marland Kitchen atorvastatin (LIPITOR) 80 MG tablet Take 1 tablet (80 mg total) by mouth at bedtime.  Marland Kitchen CALCIUM-MAGNESIUM-ZINC PO Take 1 tablet by mouth daily.  . diphenhydrAMINE (BENADRYL) 25 MG tablet Take 100 mg by mouth at bedtime.  . Flaxseed, Linseed, (FLAXSEED OIL PO) Take 1 tablet by mouth daily.   . furosemide (LASIX) 40 MG tablet TAKE 1 TABLET  BY MOUTH EVERY DAY  . gabapentin (NEURONTIN) 300 MG capsule Take 1 capsule (300 mg total) by mouth at bedtime. (Patient taking differently: Take 600 mg by mouth at bedtime. )  . ibuprofen (ADVIL,MOTRIN) 200 MG tablet Take 800 mg by mouth daily as needed for moderate pain.  Marland Kitchen insulin glargine (LANTUS) 100 UNIT/ML injection Inject 40-50 Units into the skin daily.   Marland Kitchen lisinopril (PRINIVIL,ZESTRIL) 5 MG tablet TAKE 1 TABLET BY MOUTH EVERY DAY  . metFORMIN (GLUCOPHAGE) 500 MG tablet Take 1 tablet (500 mg total) by mouth 2 (two) times daily.  . metoprolol succinate (TOPROL-XL) 25 MG 24 hr tablet TAKE 1 TABLET BY MOUTH EVERY DAY (Patient taking differently: Take 25 mg by mouth at bedtime. )  . Multiple Vitamin  (MULTIVITAMIN WITH MINERALS) TABS Take 1 tablet by mouth daily.  . nitroGLYCERIN (NITROSTAT) 0.4 MG SL tablet Place 1 tablet (0.4 mg total) under the tongue every 5 (five) minutes as needed for chest pain.  Marland Kitchen testosterone cypionate (DEPOTESTOSTERONE CYPIONATE) 200 MG/ML injection Inject 200 mg into the muscle every 14 (fourteen) days.   Marland Kitchen testosterone cypionate (DEPOTESTOTERONE CYPIONATE) 100 MG/ML injection Inject 100 mg into the muscle every 28 (twenty-eight) days. For IM use only  . zolpidem (AMBIEN) 10 MG tablet Take 10 mg by mouth at bedtime as needed.    BP 140/71   Pulse 78   Ht 5\' 7"  (1.702 m)   BMI 32.11 kg/m   Physical Exam Vitals signs and nursing note reviewed.  Constitutional:      Appearance: Normal appearance.  Neurological:     Mental Status: He is alert and oriented to person, place, and time.  Psychiatric:        Mood and Affect: Mood normal.     Left Knee Exam   Muscle Strength  The patient has normal left knee strength.  Tenderness  The patient is experiencing no tenderness.   Range of Motion  Extension: normal  Flexion: normal   Tests  McMurray:  Medial - negative Lateral - negative Varus: negative Valgus: negative Drawer:  Anterior - negative     Posterior - negative  Other  Erythema: absent Scars: absent Sensation: normal Pulse: present Swelling: none        MEDICAL DECISION SECTION  Xrays were done at N/A  My independent reading of xrays:  N/A  Encounter Diagnosis  Name Primary?  . Acute pain of left knee Yes    PLAN: (Rx., injectx, surgery, frx, mri/ct) NO TREATMENT NEEDED   No orders of the defined types were placed in this encounter.   Arther Abbott, MD  09/27/2018 10:31 AM

## 2018-10-25 ENCOUNTER — Encounter: Payer: Self-pay | Admitting: Family Medicine

## 2018-10-25 ENCOUNTER — Other Ambulatory Visit: Payer: Self-pay

## 2018-10-25 ENCOUNTER — Ambulatory Visit (INDEPENDENT_AMBULATORY_CARE_PROVIDER_SITE_OTHER): Payer: Medicare HMO | Admitting: Family Medicine

## 2018-10-25 VITALS — BP 134/73 | HR 77 | Temp 97.9°F | Ht 67.0 in | Wt 206.8 lb

## 2018-10-25 DIAGNOSIS — Z79899 Other long term (current) drug therapy: Secondary | ICD-10-CM

## 2018-10-25 DIAGNOSIS — F17200 Nicotine dependence, unspecified, uncomplicated: Secondary | ICD-10-CM

## 2018-10-25 DIAGNOSIS — I6523 Occlusion and stenosis of bilateral carotid arteries: Secondary | ICD-10-CM | POA: Diagnosis not present

## 2018-10-25 DIAGNOSIS — I10 Essential (primary) hypertension: Secondary | ICD-10-CM | POA: Diagnosis not present

## 2018-10-25 DIAGNOSIS — K635 Polyp of colon: Secondary | ICD-10-CM | POA: Diagnosis not present

## 2018-10-25 DIAGNOSIS — R69 Illness, unspecified: Secondary | ICD-10-CM | POA: Diagnosis not present

## 2018-10-25 DIAGNOSIS — Z720 Tobacco use: Secondary | ICD-10-CM

## 2018-10-25 DIAGNOSIS — E7800 Pure hypercholesterolemia, unspecified: Secondary | ICD-10-CM

## 2018-10-25 DIAGNOSIS — I5030 Unspecified diastolic (congestive) heart failure: Secondary | ICD-10-CM | POA: Diagnosis not present

## 2018-10-25 DIAGNOSIS — E1100 Type 2 diabetes mellitus with hyperosmolarity without nonketotic hyperglycemic-hyperosmolar coma (NKHHC): Secondary | ICD-10-CM | POA: Diagnosis not present

## 2018-10-25 DIAGNOSIS — Z794 Long term (current) use of insulin: Secondary | ICD-10-CM

## 2018-10-25 NOTE — Patient Instructions (Addendum)
Check glucose fasting daily Check weight daily-morning-if greater than 5 lb weight gain-call office or cardiology Trial of 5mg  Ambien for sleep Urology for testosterone CT for lung cancer screening Colonoscopy for colon cancer screening Fasting labwork -12 hour fast-[facility]

## 2018-10-25 NOTE — Progress Notes (Signed)
New Patient Office Visit  Subjective:  Patient ID: Jonathan Gilmore, male    DOB: 11-Mar-1951  Age: 68 y.o. MRN: [id number]  CC:  Chief Complaint  Patient presents with  . New Patient (Initial Visit)  . Diabetes  . Congestive Heart Failure    HPI Kanyon D Howlett presents for DM-2 , glucose 125-170-11/19 9.5%, urine with no glucose or protein Metformin 500mg  BID, Lantus-40-50 Dr. Melina Copa   CHF-Dr. Branch-Lasix 40mg  CAD - CT scan chest 02/2013 with 3 vessel CAD, severe LAD disease. - Jan 2015 nuclear stress without clear ischemia - admit 07/2015 with acute pulmonary edema in setting of severe HTN. Referred for cath.  - cath 07/2015 with 50% LAD disease, overall patent coronaries - his presentation with pulmonary edema likely secondary to severe HTN in setting of restrictive diastolic dysfunction. - echo 07/2015 LVEF 45-50%, restrictive diastolic dysfunction  - no recent chest pain. No SOB/DOE - does heavy yardwork regularly without symptoms. - can walk up flight of stairs without troubles.   Severe OSA - not using CPAP machine due to discomfort. Stopped several years ago - last visit referred to Dr Luan Pulling.   Hyperlipidemia - Lipitor 80mg -LDL  5/19 82 TC 169 - compliant with statin. Elevated TGs in setting of poorly controlled DM2  Aortic stenosis - echo - denies any recent symptoms   HTN - he is compliant with meds   Chronic diastolic HF - echo 3/01 with restrictive diastolic dysfunction. Low normal to mildly decreased LVEF at 60-65%.  -no recent symptoms, no edema  Claudication/PAD - followed by vascular-prn-100% blockage LE  Carotid stenosis-50% Prescriptions Total Prescriptions: 50   Total Private Pay: 28   Fill Date ID   Written Drug Qty Days Prescriber Rx # Pharmacy Refill   Daily Dose* Pymt Type PMP    09/28/2018  1   09/18/2018  Testosterone Cyp 200 Mg/Ml  6.00 90 Cy But   [id number]   [place] ([id number])   0   Medicare   [place]  09/24/2018  1   09/18/2018   Zolpidem Tartrate 10 MG Tablet  90.00 90 Cy But   [id number]   [place] ([id number])   0  0.50 LME  Private Pay   [place]  08/31/2018  1   03/13/2018  Testosterone Cyp 200 Mg/Ml  2.00 28 Cy But   [id number]   [place] ([id number])   3   Medicare   [place]  08/28/2018  1   05/29/2018  Zolpidem Tartrate 10 MG Tablet  30.00 30 Cy But   [id number]   [place] (6833)   3  0.50 LME  Private Pay   [place]  07/28/2018  1   05/29/2018  Zolpidem Tartrate 10 MG Tablet  30.00 30 Cy But   [id number]   [place] (6833)   2  0.50 LME  Private Pay   [place]  07/17/2018  1   03/13/2018  Testosterone Cyp 200 Mg/Ml  2.00 28 Cy But   [id number]   [place] (6833)   2   Medicare   [place]  06/28/2018  1   05/29/2018  Zolpidem Tartrate 10 MG Tablet  30.00 30 Cy But   [id number]   [place] (6833)   1  0.50 LME  Private Pay   [place]  06/04/2018  1   03/13/2018  Testosterone Cyp 200 Mg/Ml  2.00 28 Cy But   [id number]   [place] (6833)   1   Medicare   [place]  05/29/2018  1   05/29/2018  Zolpidem Tartrate 10 MG Tablet  30.00 30 Cy But   [id number]   [place] (6833)   0  0.50 LME  Private Pay   [place]  04/29/2018  1   01/30/2018  Zolpidem Tartrate 10 MG Tablet  30.00 30 Cy But   [id number]   [place] (6833)   3  0.50 LME  Private Pay   [place]  03/29/2018  1   01/30/2018  Zolpidem Tartrate 10 MG Tablet  30.00 30 Cy But   [id number]   [place] (6833)   2  0.50 LME  Private Pay   [place]  03/28/2018  1   03/13/2018  Testosterone Cyp 200 Mg/Ml  2.00 28 Cy But   [id number]   [place] (6833)   0   Medicare   [place]  02/26/2018  1   01/30/2018  Zolpidem Tartrate 10 MG Tablet  30.00 30 Cy But   [id number]   [place] (6833)   1  0.50 LME  Private Pay   [place]  01/30/2018  1   01/30/2018  Zolpidem Tartrate 10 MG Tablet  30.00 30 Cy But   [id number]   [place] (6833)   0  0.50 LME  Private Pay   [place]  12/28/2017  1   09/27/2017  Zolpidem Tartrate 10 MG Tablet  30.00 30 Cy But   [id number]   [place] (6833)   3  0.50 LME  Private Pay   [place]  12/23/2017  1   07/04/2017  Testosterone Cyp 200 Mg/Ml  2.00 28 Cy But   [id number]   [place] (6833)   5   Medicare   [place]  12/14/2017  1   12/05/2017  Oxycodone-Acetaminophen  10-325  15.00 15 Cy But   [id number]   [place] (6833)   0  15.00 MME  Private Pay   [place]  11/29/2017  1   11/28/2017  Oxycodone-Acetaminophen 10-325  15.00 15 Cy But   [id number]   [place] (6833)   0  15.00 MME  Private Pay   [place]  11/27/2017  1   09/27/2017  Zolpidem Tartrate 10 MG Tablet  30.00 30 Cy But   [id number]   [place] (6833)   2  0.50 LME  Private Pay   [place]  11/21/2017  1   07/04/2017  Testosterone Cyp 200 Mg/Ml  2.00 28 Cy But   [id number]   [place] (6833)   4   Medicare   [place]  11/11/2017  1   11/10/2017  Oxycodone-Acetaminophen 10-325  60.00 15 Cy But   [id number]   [place] (6833)   0  60.00 MME  Private Pay   [place]  10/27/2017  1   09/27/2017  Zolpidem Tartrate 10 MG Tablet  30.00 30 Cy But   [id number]   [place] (6833)   1  0.50 LME  Private Pay   [place]  10/27/2017  1   10/26/2017  Oxycodone-Acetaminophen 10-325  60.00 15 Cy But   [id number]   [place] (6833)   0  60.00 MME  Private Pay   [place]  10/22/2017  1   07/04/2017  Depo-Testosterone 200 Mg/Ml  2.00 28 Cy But   [id number]   [place] (6833)   3   Private Pay   [place]  10/12/2017  1   10/11/2017  Oxycodone-Acetaminophen 10-325  60.00 15 Cy But   [id number]   [place] (6833)   0  60.00 MME  Private Pay   [place]  09/28/2017  1   09/28/2017  Oxycodone-Acetaminophen  10-325  60.00 15 Cy But   [id number]   [place] (6833)   0  60.00 MME  Private Pay   [place]  09/28/2017  1   09/27/2017  Zolpidem Tartrate 10 MG Tablet  30.00 30 Cy But   [id number]   [place] (6833)   0  0.50 LME  Private Pay    [place]  09/21/2017  1   09/21/2017  Oxycodone-Acetaminophen 10-325  28.00 7 Cy But   [id number]   [place] (6833)   0  60.00 MME  Private Pay   [place]  09/20/2017  1   07/04/2017  Testosterone Cyp 200 Mg/Ml  2.00 28 Cy But   [id number]   [place] (6833)   2   Medicare   [place]  09/09/2017  1   09/08/2017  Hydrocodone-Acetamin 5-325 MG  20.00 5 Cy But   [id number]   [place] (6833)   0  20.00 MME  Private Pay   [place]  09/08/2017  1   09/07/2017  Diazepam 10 MG Tablet  2.00 1 Cy But   [id number]   [place] (6833)   0  2.00 LME  Private Pay   [place]  08/26/2017  1   08/26/2017   Hydrocodone-Acetamin 5-325 MG  10.00 2 Sa Pet   [id number]   [place] ([id number])   0  25.00 MME  Private Pay   [place]  08/25/2017  1   08/25/2017  Zolpidem Tartrate 10 MG Tablet  30.00 30 Ma Sta   [id number]   [place] (6833)   0  0.50 LME  Private Pay   [place]  08/16/2017  1   07/04/2017  Testosterone Cyp 200 Mg/Ml  2.00 28 Cy But   [id number]   [place] ([id number])   1   Private Pay   [place]  07/23/2017  1   04/28/2017  Zolpidem Tartrate 10 MG Tablet  30.00 30 Cy But   [id number]           Past Medical History:  Diagnosis Date  . Abnormal cardiovascular stress test 03/2013  . Anxiety   . Arthritis   . Back pain   . CHF (congestive heart failure) ([place])   . Coronary artery disease    Multivessel with significant LAD involvement by chest CT 2014  . Depression   . Diastolic dysfunction   . Essential hypertension   . Lumbar herniated disc    L4-L5  . MRSA (methicillin resistant staph aureus) culture positive   . Shortness of breath dyspnea   . Sleep apnea    No cpap  . Type 2 diabetes mellitus ([place])     Past Surgical History:  Procedure Laterality Date  . ANTERIOR CERVICAL DECOMP/DISCECTOMY FUSION  01/18/2011   Procedure: ANTERIOR CERVICAL DECOMPRESSION/DISCECTOMY FUSION 2 LEVELS;  Surgeon: Cooper Render Pool;  Location: [facility];  Service: Neurosurgery;  Laterality: N/A;  anterior cervical discectomy and fusion with allograft and plating, cervical five-six, cervical six-seven  . BIOPSY N/A 04/04/2015   Procedure: BIOPSY;  Surgeon: Rogene Houston, MD;  Location: [facility];  Service: Endoscopy;  Laterality: N/A;  . Bone spur removed  1997   Left shoulder  . CARDIAC CATHETERIZATION N/A 08/08/2015   Procedure: Right/Left Heart Cath and Coronary Angiography;  Surgeon: Sherren Mocha, MD;  Location: [facility];  Service: Cardiovascular;  Laterality: N/A;  . CARPAL TUNNEL RELEASE Right   . ESOPHAGEAL DILATION N/A 04/04/2015   Procedure: ESOPHAGEAL DILATION;  Surgeon: Rogene Houston, MD;  Location: [facility];  Service:  Endoscopy;  Laterality: N/A;  . ESOPHAGOGASTRODUODENOSCOPY N/A 04/04/2015   Procedure: ESOPHAGOGASTRODUODENOSCOPY (EGD);  Surgeon: Rogene Houston, MD;  Location: [facility];  Service: Endoscopy;  Laterality: N/A;  1240  . HEMORRHOID SURGERY    . TONSILLECTOMY      Family History  Problem Relation Age of Onset  . Lung cancer Mother   . Diabetes Father   . Hepatitis B Father   . Diabetes Sister   . Cushing syndrome Sister     Social History  1 pk/day 30years, 1/2 pk/week 20years Socioeconomic History  . Marital status: Married    Spouse name: Not on file  . Number of children: Not on file  . Years of education: Not on file  . Highest education level: Not on file  Occupational History  . Not on file  Social Needs  . Financial resource strain: Not on file  . Food insecurity    Worry: Not on file    Inability: Not on file  . Transportation needs    Medical: Not on file    Non-medical: Not on file  Tobacco Use  . Smoking status: Current Every Day Smoker    Packs/day: 1.00    Years: 50.00    Pack years: 50.00    Types: Cigarettes  . Smokeless tobacco: Former Systems developer  . Tobacco comment: smoking x 50 yrs  Substance and Sexual Activity  . Alcohol use: Yes    Alcohol/week: 1.0 standard drinks    Types: 1 Standard drinks or equivalent per week    Comment: rare  . Drug use: Yes    Types: Marijuana    Comment: as needed  . Sexual activity: Yes  Lifestyle  . Physical activity    Days per week: Not on file    Minutes per session: Not on file  . Stress: Not on file  Relationships  . Social Herbalist on phone: Not on file    Gets together: Not on file    Attends religious service: Not on file    Active member of club or organization: Not on file    Attends meetings of clubs or organizations: Not on file    Relationship status: Not on file  . Intimate partner violence    Fear of current or ex partner: Not on file    Emotionally abused: Not on file     Physically abused: Not on file    Forced sexual activity: Not on file  Other Topics Concern  . Not on file  Social History Narrative  . Not on file    ROS Review of Systems  Constitutional: Negative for fatigue and fever.  HENT: Negative for congestion, sinus pressure and sore throat.   Eyes: Negative for visual disturbance.  Cardiovascular: Negative for chest pain and leg swelling.  Gastrointestinal: Negative for abdominal pain, diarrhea and nausea.  Genitourinary: Negative for hematuria and urgency.  Musculoskeletal: Positive for back pain and neck pain.  Allergic/Immunologic: Negative for environmental allergies.  Neurological: Negative for syncope.  Psychiatric/Behavioral: The patient is not nervous/anxious.     Objective:   Today's Vitals: BP 134/73 (BP Location: Left Arm, Patient Position: Sitting, Cuff Size: Normal)   Pulse 77   Temp 97.9 F (36.6 C) (Oral)   Ht 5\' 7"  (1.702 m)   Wt 206 lb 12.8 oz (93.8 kg)   SpO2 93%   BMI 32.39 kg/m   Physical Exam Constitutional:      Appearance:  Normal appearance.  HENT:     Head: Normocephalic and atraumatic.  Cardiovascular:     Rate and Rhythm: Normal rate and regular rhythm.     Pulses: Normal pulses.     Heart sounds: Normal heart sounds.  Pulmonary:     Effort: Pulmonary effort is normal.     Breath sounds: Normal breath sounds.  Musculoskeletal: Normal range of motion.  Neurological:     Mental Status: He is alert and oriented to person, place, and time.  Psychiatric:        Mood and Affect: Mood normal.     Assessment & Plan:   1. Polyp of colon, unspecified part of colon, unspecified type Needs colonoscopy-h/o polyps - Ambulatory referral to Gastroenterology  2. Tobacco use Meet criteria for screening 67yo Greater than 30 pack years No symptoms No prior screening - CT CHEST LUNG CA SCREEN LOW DOSE W/O CM; Future  3. Tobacco dependence  4. Carotid stenosis, asymptomatic, bilateral Previously  50%-repeat annual for follow up - US Carotid Duplex Bilateral; Future  5. Diastolic congestive heart failure, unspecified HF chronicity (HCC) Cardio following-no CP/SOB/LE edema  6. Type 2 diabetes mellitus with hyperosmolarity without coma, with long-term current use of insulin (HCC) Foot exam completed - COMPLETE METABOLIC PANEL WITH GFR - Hemoglobin A1c - Microalbumin, urine - Ambulatory referral to Urology Testosterone injections-pt and I discussed -controlled substance. Pt has not had recent levels 7. Pure hypercholesterolemia - COMPLETE METABOLIC PANEL WITH GFR - Lipid panel  8. Essential hypertension stable - CBC with Differential/Platelet - TSH  9. High risk medication use Using testosterone, has chf with severe CAD - CBC with Differential/Platelet Outpatient Encounter Medications as of 10/25/2018  Medication Sig  . aspirin EC 81 MG tablet Take 81 mg by mouth daily.  Marland Kitchen atorvastatin (LIPITOR) 80 MG tablet Take 1 tablet (80 mg total) by mouth at bedtime.  Marland Kitchen CALCIUM-MAGNESIUM-ZINC PO Take 1 tablet by mouth daily.  . cyclobenzaprine (FLEXERIL) 10 MG tablet Take 1 tablet (10 mg total) by mouth 2 (two) times daily as needed for muscle spasms. (Patient not taking: Reported on 09/27/2018)  . diphenhydrAMINE (BENADRYL) 25 MG tablet Take 100 mg by mouth at bedtime.  . Flaxseed, Linseed, (FLAXSEED OIL PO) Take 1 tablet by mouth daily.   . furosemide (LASIX) 40 MG tablet TAKE 1 TABLET BY MOUTH EVERY DAY  . gabapentin (NEURONTIN) 300 MG capsule Take 1 capsule (300 mg total) by mouth at bedtime. (Patient taking differently: Take 600 mg by mouth at bedtime. )  . ibuprofen (ADVIL,MOTRIN) 200 MG tablet Take 800 mg by mouth daily as needed for moderate pain.  Marland Kitchen insulin glargine (LANTUS) 100 UNIT/ML injection Inject 40-50 Units into the skin daily.   Marland Kitchen lisinopril (PRINIVIL,ZESTRIL) 5 MG tablet TAKE 1 TABLET BY MOUTH EVERY DAY  . metFORMIN (GLUCOPHAGE) 500 MG tablet Take 1 tablet (500 mg  total) by mouth 2 (two) times daily.  . metoprolol succinate (TOPROL-XL) 25 MG 24 hr tablet TAKE 1 TABLET BY MOUTH EVERY DAY (Patient taking differently: Take 25 mg by mouth at bedtime. )  . Multiple Vitamin (MULTIVITAMIN WITH MINERALS) TABS Take 1 tablet by mouth daily.  . nitroGLYCERIN (NITROSTAT) 0.4 MG SL tablet Place 1 tablet (0.4 mg total) under the tongue every 5 (five) minutes as needed for chest pain.  Marland Kitchen testosterone cypionate (DEPOTESTOSTERONE CYPIONATE) 200 MG/ML injection Inject 200 mg into the muscle every 14 (fourteen) days.   Marland Kitchen testosterone cypionate (DEPOTESTOTERONE CYPIONATE) 100 MG/ML injection Inject 100  mg into the muscle every 28 (twenty-eight) days. For IM use only  . zolpidem (AMBIEN) 10 MG tablet Take 10 mg by mouth at bedtime as needed.   No facility-administered encounter medications on file as of 10/25/2018.     Follow-up: No follow-ups on file.    Hannah Beat, MD

## 2018-10-28 DIAGNOSIS — Z720 Tobacco use: Secondary | ICD-10-CM | POA: Insufficient documentation

## 2018-10-28 DIAGNOSIS — I6523 Occlusion and stenosis of bilateral carotid arteries: Secondary | ICD-10-CM | POA: Insufficient documentation

## 2018-10-28 DIAGNOSIS — K635 Polyp of colon: Secondary | ICD-10-CM | POA: Insufficient documentation

## 2018-10-28 DIAGNOSIS — Z79899 Other long term (current) drug therapy: Secondary | ICD-10-CM | POA: Insufficient documentation

## 2018-11-13 ENCOUNTER — Encounter: Payer: Self-pay | Admitting: "Endocrinology

## 2018-11-13 ENCOUNTER — Other Ambulatory Visit: Payer: Self-pay

## 2018-11-13 ENCOUNTER — Ambulatory Visit: Payer: Medicare HMO | Admitting: "Endocrinology

## 2018-11-13 VITALS — BP 142/77 | HR 62 | Ht 67.0 in | Wt 206.0 lb

## 2018-11-13 DIAGNOSIS — E1159 Type 2 diabetes mellitus with other circulatory complications: Secondary | ICD-10-CM

## 2018-11-13 DIAGNOSIS — E782 Mixed hyperlipidemia: Secondary | ICD-10-CM | POA: Diagnosis not present

## 2018-11-13 DIAGNOSIS — I10 Essential (primary) hypertension: Secondary | ICD-10-CM | POA: Insufficient documentation

## 2018-11-13 LAB — POCT GLYCOSYLATED HEMOGLOBIN (HGB A1C): Hemoglobin A1C: 8.8 % — AB (ref 4.0–5.6)

## 2018-11-13 NOTE — Progress Notes (Signed)
Endocrinology Consult Note       11/13/2018, 7:06 PM   Subjective:    Patient ID: Jonathan Gilmore, male    DOB: [birth year].  Jonathan Gilmore is being seen in consultation for management of currently uncontrolled symptomatic diabetes requested by  Maryruth Hancock, MD.   Past Medical History:  Diagnosis Date  . Abnormal cardiovascular stress test 03/2013  . Anxiety   . Arthritis   . Back pain   . CHF (congestive heart failure) ([place])   . Coronary artery disease    Multivessel with significant LAD involvement by chest CT 2014  . Depression   . Diastolic dysfunction   . Essential hypertension   . Lumbar herniated disc    L4-L5  . MRSA (methicillin resistant staph aureus) culture positive   . Shortness of breath dyspnea   . Sleep apnea    No cpap  . Type 2 diabetes mellitus ([place])     Past Surgical History:  Procedure Laterality Date  . ANTERIOR CERVICAL DECOMP/DISCECTOMY FUSION  01/18/2011   Procedure: ANTERIOR CERVICAL DECOMPRESSION/DISCECTOMY FUSION 2 LEVELS;  Surgeon: Cooper Render Pool;  Location: [facility];  Service: Neurosurgery;  Laterality: N/A;  anterior cervical discectomy and fusion with allograft and plating, cervical five-six, cervical six-seven  . BIOPSY N/A 04/04/2015   Procedure: BIOPSY;  Surgeon: Rogene Houston, MD;  [facility]: [facility];  Service: Endoscopy;  Laterality: N/A;  . Bone spur removed  1997   Left shoulder  . CARDIAC CATHETERIZATION N/A 08/08/2015   Procedure: Right/Left Heart Cath and Coronary Angiography;  Surgeon: Sherren Mocha, MD;  Location: [facility];  Service: Cardiovascular;  Laterality: N/A;  . CARPAL TUNNEL RELEASE Right   . ESOPHAGEAL DILATION N/A 04/04/2015   Procedure: ESOPHAGEAL DILATION;  Surgeon: Rogene Houston, MD;  [facility]: [facility];  Service: Endoscopy;  Laterality: N/A;  . ESOPHAGOGASTRODUODENOSCOPY N/A 04/04/2015   Procedure:  ESOPHAGOGASTRODUODENOSCOPY (EGD);  Surgeon: Rogene Houston, MD;  Location: [facility];  Service: Endoscopy;  Laterality: N/A;  1240  . HEMORRHOID SURGERY    . TONSILLECTOMY      Social History   Socioeconomic History  . Marital status: Married    Spouse name: Not on file  . Number of children: Not on file  . Years of education: Not on file  . Highest education level: Not on file  Occupational History  . Not on file  Social Needs  . Financial resource strain: Not on file  . Food insecurity    Worry: Not on file    Inability: Not on file  . Transportation needs    Medical: Not on file    Non-medical: Not on file  Tobacco Use  . Smoking status: Current Every Day Smoker    Packs/day: 1.00    Years: 50.00    Pack years: 50.00    Types: Cigarettes  . Smokeless tobacco: Former Systems developer  . Tobacco comment: smoking x 50 yrs  Substance and Sexual Activity  . Alcohol use: Yes    Alcohol/week: 1.0 standard drinks    Types: 1 Standard drinks or equivalent per  week    Comment: rare  . Drug use: Yes    Types: Marijuana    Comment: as needed  . Sexual activity: Yes  Lifestyle  . Physical activity    Days per week: Not on file    Minutes per session: Not on file  . Stress: Not on file  Relationships  . Social Herbalist on phone: Not on file    Gets together: Not on file    Attends religious service: Not on file    Active member of club or organization: Not on file    Attends meetings of clubs or organizations: Not on file    Relationship status: Not on file  Other Topics Concern  . Not on file  Social History Narrative  . Not on file    Family History  Problem Relation Age of Onset  . Lung cancer Mother   . Diabetes Father   . Hepatitis B Father   . Diabetes Sister   . Cushing syndrome Sister     Outpatient Encounter Medications as of 11/13/2018  Medication Sig  . Insulin Glargine (LANTUS SOLOSTAR) 100 UNIT/ML Solostar Pen Inject 20 Units into the skin  at bedtime.  . insulin lispro (HUMALOG) 100 UNIT/ML KwikPen Inject 1 Units into the skin 3 (three) times daily before meals.  Marland Kitchen aspirin EC 81 MG tablet Take 81 mg by mouth daily.  Marland Kitchen atorvastatin (LIPITOR) 80 MG tablet Take 1 tablet (80 mg total) by mouth at bedtime.  Marland Kitchen CALCIUM-MAGNESIUM-ZINC PO Take 1 tablet by mouth daily.  . diphenhydrAMINE (BENADRYL) 25 MG tablet Take 100 mg by mouth at bedtime.  . Flaxseed, Linseed, (FLAXSEED OIL PO) Take 1 tablet by mouth daily.   . furosemide (LASIX) 40 MG tablet TAKE 1 TABLET BY MOUTH EVERY DAY  . gabapentin (NEURONTIN) 300 MG capsule Take 1 capsule (300 mg total) by mouth at bedtime. (Patient taking differently: Take 600 mg by mouth at bedtime. )  . ibuprofen (ADVIL,MOTRIN) 200 MG tablet Take 800 mg by mouth daily as needed for moderate pain.  Marland Kitchen lisinopril (PRINIVIL,ZESTRIL) 5 MG tablet TAKE 1 TABLET BY MOUTH EVERY DAY  . metFORMIN (GLUCOPHAGE) 500 MG tablet Take 1 tablet (500 mg total) by mouth 2 (two) times daily.  . metoprolol succinate (TOPROL-XL) 25 MG 24 hr tablet TAKE 1 TABLET BY MOUTH EVERY DAY (Patient taking differently: Take 25 mg by mouth at bedtime. )  . Multiple Vitamin (MULTIVITAMIN WITH MINERALS) TABS Take 1 tablet by mouth daily.  . nitroGLYCERIN (NITROSTAT) 0.4 MG SL tablet Place 1 tablet (0.4 mg total) under the tongue every 5 (five) minutes as needed for chest pain.  Marland Kitchen testosterone cypionate (DEPOTESTOSTERONE CYPIONATE) 200 MG/ML injection Inject 200 mg into the muscle every 14 (fourteen) days.   . [DISCONTINUED] insulin glargine (LANTUS) 100 UNIT/ML injection Inject 40-50 Units into the skin daily.   . [DISCONTINUED] testosterone cypionate (DEPOTESTOTERONE CYPIONATE) 100 MG/ML injection Inject 100 mg into the muscle every 28 (twenty-eight) days. For IM use only   No facility-administered encounter medications on file as of 11/13/2018.     ALLERGIES: Allergies  Allergen Reactions  . Morphine And Related Nausea And Vomiting     VACCINATION STATUS: Immunization History  Administered Date(s) Administered  . Influenza Split 01/19/2011  . Influenza,inj,Quad PF,6+ Mos 02/25/2013  . Pneumococcal Polysaccharide-23 02/25/2013    Diabetes He presents for his initial diabetic visit. He has type 2 diabetes mellitus. His disease course has been worsening. There are  no hypoglycemic associated symptoms. Pertinent negatives for hypoglycemia include no confusion, headaches, pallor or seizures. Associated symptoms include polydipsia and polyuria. Pertinent negatives for diabetes include no chest pain, no fatigue, no polyphagia and no weakness. There are no hypoglycemic complications. Risk factors for coronary artery disease include diabetes mellitus, male sex, tobacco exposure, sedentary lifestyle and obesity. Current diabetic treatments: He is currently on Lantus 12 units nightly, and Humalog 6 to 10 units 3 times daily AC. He is following a generally unhealthy diet. When asked about meal planning, he reported none. He has not had a previous visit with a dietitian. He never participates in exercise. (He did not bring any logs nor meter for review.  His recent labs show normal renal function, point-of-care A1c today was 8.8%.) An ACE inhibitor/angiotensin II receptor blocker is being taken. Eye exam is current.  Hypertension This is a chronic problem. The current episode started more than 1 year ago. The problem is uncontrolled. Pertinent negatives include no chest pain, headaches, neck pain, palpitations or shortness of breath. Risk factors for coronary artery disease include diabetes mellitus, male gender, obesity, sedentary lifestyle and smoking/tobacco exposure. Past treatments include ACE inhibitors.     Review of Systems  Constitutional: Negative for chills, fatigue, fever and unexpected weight change.  HENT: Negative for dental problem, mouth sores and trouble swallowing.   Eyes: Negative for visual disturbance.   Respiratory: Negative for cough, choking, chest tightness, shortness of breath and wheezing.   Cardiovascular: Negative for chest pain, palpitations and leg swelling.  Gastrointestinal: Negative for abdominal distention, abdominal pain, constipation, diarrhea, nausea and vomiting.  Endocrine: Positive for polydipsia and polyuria. Negative for polyphagia.  Genitourinary: Negative for dysuria, flank pain, hematuria and urgency.  Musculoskeletal: Negative for back pain, gait problem, myalgias and neck pain.  Skin: Negative for pallor, rash and wound.  Neurological: Negative for seizures, syncope, weakness, numbness and headaches.  Psychiatric/Behavioral: Negative for confusion and dysphoric mood.    Objective:    BP (!) 142/77   Pulse 62   Ht 5\' 7"  (1.702 m)   Wt 206 lb (93.4 kg)   BMI 32.26 kg/m   Wt Readings from Last 3 Encounters:  11/13/18 206 lb (93.4 kg)  10/25/18 206 lb 12.8 oz (93.8 kg)  03/21/18 205 lb (93 kg)     Physical Exam Constitutional:      General: He is not in acute distress.    Appearance: He is well-developed.  HENT:     Head: Normocephalic and atraumatic.  Neck:     Musculoskeletal: Normal range of motion and neck supple.     Thyroid: No thyromegaly.     Trachea: No tracheal deviation.  Cardiovascular:     Rate and Rhythm: Normal rate.     Pulses:          Dorsalis pedis pulses are 1+ on the right side and 1+ on the left side.       Posterior tibial pulses are 1+ on the right side and 1+ on the left side.     Heart sounds: S1 normal and S2 normal. No murmur. No gallop.   Pulmonary:     Effort: Pulmonary effort is normal. No respiratory distress.     Breath sounds: No wheezing.  Abdominal:     General: There is no distension.     Tenderness: There is no abdominal tenderness. There is no guarding.  Musculoskeletal:     Right shoulder: He exhibits no swelling and no deformity.  Skin:  General: Skin is warm and dry.     Findings: No rash.      Nails: There is no clubbing.   Neurological:     Mental Status: He is alert and oriented to person, place, and time.     Cranial Nerves: No cranial nerve deficit.     Sensory: No sensory deficit.     Gait: Gait normal.     Deep Tendon Reflexes: Reflexes are normal and symmetric.  Psychiatric:        Speech: Speech normal.        Behavior: Behavior normal. Behavior is cooperative.        Thought Content: Thought content normal.        Judgment: Judgment normal.       CMP ( most recent) CMP     Component Value Date/Time   NA 139 08/08/2015 0335   K 4.0 08/08/2015 0335   CL 102 08/08/2015 0335   CO2 31 08/08/2015 0335   GLUCOSE 159 (H) 08/08/2015 0335   BUN 13 08/08/2015 0335   CREATININE 0.78 08/08/2015 0335   CALCIUM 8.8 (L) 08/08/2015 0335   PROT 6.5 08/08/2015 0335   ALBUMIN 3.7 08/08/2015 0335   AST 17 08/08/2015 0335   ALT 16 (L) 08/08/2015 0335   ALKPHOS 74 08/08/2015 0335   BILITOT 0.6 08/08/2015 0335   GFRNONAA >60 08/08/2015 0335   GFRAA >60 08/08/2015 0335     Diabetic Labs (most recent): Lab Results  Component Value Date   HGBA1C 8.8 (A) 11/13/2018   HGBA1C 9.5 (H) 01/16/2018   HGBA1C 8.4 (H) 08/07/2015     Lipid Panel ( most recent) Lipid Panel     Component Value Date/Time   CHOL 135 08/07/2015 0509   TRIG 194 (H) 08/07/2015 0509   HDL 38 (L) 08/07/2015 0509   CHOLHDL 3.6 08/07/2015 0509   VLDL 39 08/07/2015 0509   LDLCALC 58 08/07/2015 0509      Lab Results  Component Value Date   TSH 0.421 09/19/2013      Assessment & Plan:   1. DM type 2 causing vascular disease ([place])  - [place] has currently uncontrolled symptomatic type 2 DM since 68 years of age,  with most recent A1c of 8.8 %. Recent labs reviewed. - I had a long discussion with him about the progressive nature of diabetes and the pathology behind its complications. -his diabetes is complicated by coronary artery disease,  heavy smoking, and he remains at a high risk  for more acute and chronic complications which include CAD, CVA, CKD, retinopathy, and neuropathy. These are all discussed in detail with him.  - I have counseled him on diet management and weight loss, by adopting a carbohydrate restricted/protein rich diet. - he admits that there is a room for improvement in his food and drink choices. - Suggestion is made for him to avoid simple carbohydrates  from his diet including Cakes, Sweet Desserts, Ice Cream, Soda (diet and regular), Sweet Tea, Candies, Chips, Cookies, Store Bought Juices, Alcohol in Excess of  1-2 drinks a day, Artificial Sweeteners,  Coffee Creamer, and "Sugar-free" Products. This will help patient to have more stable blood glucose profile and potentially avoid unintended weight gain.  - I encouraged him to switch to  unprocessed or minimally processed complex starch and increased protein intake (animal or plant source), fruits, and vegetables.  - he is advised to stick to a routine mealtimes to eat 3 meals  a day and  avoid unnecessary snacks ( to snack only to correct hypoglycemia).   - he will be scheduled with Jearld Fenton, RDN, CDE for diabetes education.  - I have approached him with the following individualized plan to manage  his diabetes and patient agrees:   - he reveals that he is not taking the amount of insulin in his medical records.  I approached him for slight increase in his Lantus to 20 units nightly, advised to hold his Humalog until his next visit, initiate strict monitoring of blood glucose 4 times a day-   before meals and at bedtime, and return for a visit in 10 days for reevaluation. - he is warned not to take insulin without proper monitoring per orders.  - he is encouraged to call clinic for blood glucose levels less than 70 or above 200 mg /dl. - he is advised to continue metformin 500 mg p.o. twice daily, therapeutically suitable for patient. He is not a candidate for SGLT2 inhibitors nor incretin therapy  given his chronic heavy smoking.   - Patient specific target  A1c;  LDL, HDL, Triglycerides, and  Waist Circumference were discussed in detail.  2) Blood Pressure /Hypertension:  his blood pressure is uncontrolled to target.   he is advised to continue his current medications including lisinopril 5 mg p.o. daily with breakfast . 3) Lipids/Hyperlipidemia:   Review of his recent lipid panel showed  controlled  LDL at 55 .  he  is advised to continue    atorvastatin 80 mg daily at bedtime.  Side effects and precautions discussed with him.  4)  Weight/Diet:  Body mass index is 32.26 kg/m.  -   clearly complicating his diabetes care.  I discussed with him the fact that loss of 5 - 10% of his  current body weight will have the most impact on his diabetes management.  CDE Consult will be initiated . Exercise, and detailed carbohydrates information provided  -  detailed on discharge instructions.   5) hypogonadism: The circumstance of these diagnoses are not available for review.  He is currently on testosterone 200 mg IM every 14 days.  He does not have recent testosterone measurements. Will be considered for fasting testosterone measurement on subsequent visits.  He is advised to continue the same dosing regimen at this time.  -He has an upcoming consultation for urology, encouraged  to keep.  6) Chronic Care/Health Maintenance:  -he  is on ACEI/ARB and Statin medications and  is encouraged to initiate and continue to follow up with Ophthalmology, Dentist,  Podiatrist at least yearly or according to recommendations, and advised to  Quit smoking. I have recommended yearly flu vaccine and pneumonia vaccine at least every 5 years; moderate intensity exercise for up to 150 minutes weekly; and  sleep for at least 7 hours a day.  - he is  advised to maintain close follow up with [place], Rex Kras, MD for primary care needs, as well as his other providers for optimal and coordinated care.  - Time spent with the  patient: 45 minutes, of which >50% was spent in obtaining information about his symptoms, reviewing his previous labs/studies, evaluations, and treatments, counseling him about his currently uncontrolled type 2 diabetes, hyperlipidemia, hypertension, and developing plans for long term treatment based on the latest standards of care/guidelines.  Please refer to " Patient Self Inventory" in the Media  tab for reviewed elements of pertinent patient history.  Moritz D Shoe participated in the discussions, expressed understanding, and  voiced agreement with the above plans.  All questions were answered to his satisfaction. he is encouraged to contact clinic should he have any questions or concerns prior to his return visit.  Follow up plan: - Return in about 10 days (around 11/23/2018) for Follow up with Pre-visit Labs, Meter, and Logs.  Glade Lloyd, MD [facility] [address] [place], [place] [address] Phone: [phone]  Fax: [phone]    11/13/2018, 7:06 PM  This note was partially dictated with voice recognition software. Similar sounding words can be transcribed inadequately or may not  be corrected upon review.

## 2018-11-13 NOTE — Patient Instructions (Signed)

## 2018-11-14 ENCOUNTER — Other Ambulatory Visit: Payer: Self-pay | Admitting: Family Medicine

## 2018-11-14 DIAGNOSIS — Z794 Long term (current) use of insulin: Secondary | ICD-10-CM | POA: Diagnosis not present

## 2018-11-14 DIAGNOSIS — I10 Essential (primary) hypertension: Secondary | ICD-10-CM | POA: Diagnosis not present

## 2018-11-14 DIAGNOSIS — K635 Polyp of colon: Secondary | ICD-10-CM

## 2018-11-14 DIAGNOSIS — E1100 Type 2 diabetes mellitus with hyperosmolarity without nonketotic hyperglycemic-hyperosmolar coma (NKHHC): Secondary | ICD-10-CM

## 2018-11-14 LAB — COMPLETE METABOLIC PANEL WITH GFR
AG Ratio: 1.7 (calc) (ref 1.0–2.5)
ALT: 16 U/L (ref 9–46)
AST: 17 U/L (ref 10–35)
Albumin: 4 g/dL (ref 3.6–5.1)
Alkaline phosphatase (APISO): 76 U/L (ref 35–144)
BUN: 10 mg/dL (ref 7–25)
CO2: 28 mmol/L (ref 20–32)
Calcium: 8.9 mg/dL (ref 8.6–10.3)
Chloride: 106 mmol/L (ref 98–110)
Creat: 0.72 mg/dL (ref 0.70–1.25)
GFR, Est African American: 112 mL/min/{1.73_m2} (ref >=60)
GFR, Est Non African American: 97 mL/min/{1.73_m2} (ref >=60)
Globulin: 2.4 g/dL (calc) (ref 1.9–3.7)
Glucose, Bld: 177 mg/dL — ABNORMAL HIGH (ref 65–99)
Potassium: 4.1 mmol/L (ref 3.5–5.3)
Sodium: 140 mmol/L (ref 135–146)
Total Bilirubin: 0.5 mg/dL (ref 0.2–1.2)
Total Protein: 6.4 g/dL (ref 6.1–8.1)

## 2018-11-14 LAB — CBC WITH DIFFERENTIAL/PLATELET
Absolute Monocytes: 713 cells/uL (ref 200–950)
Basophils Absolute: 76 cells/uL (ref 0–200)
Basophils Relative: 0.8 %
Eosinophils Absolute: 399 cells/uL (ref 15–500)
Eosinophils Relative: 4.2 %
HCT: 48.5 % (ref 38.5–50.0)
Hemoglobin: 15.9 g/dL (ref 13.2–17.1)
Lymphs Abs: 2850 cells/uL (ref 850–3900)
MCH: 29.4 pg (ref 27.0–33.0)
MCHC: 32.8 g/dL (ref 32.0–36.0)
MCV: 89.8 fL (ref 80.0–100.0)
MPV: 10.9 fL (ref 7.5–12.5)
Monocytes Relative: 7.5 %
Neutro Abs: 5463 cells/uL (ref 1500–7800)
Neutrophils Relative %: 57.5 %
Platelets: 227 10*3/uL (ref 140–400)
RBC: 5.4 10*6/uL (ref 4.20–5.80)
RDW: 13.6 % (ref 11.0–15.0)
Total Lymphocyte: 30 %
WBC: 9.5 10*3/uL (ref 3.8–10.8)

## 2018-11-14 LAB — LIPID PANEL
Cholesterol: 82 mg/dL (ref <200)
HDL: 25 mg/dL — ABNORMAL LOW (ref >=40)
LDL Cholesterol (Calc): 35 mg/dL (calc)
Non-HDL Cholesterol (Calc): 57 mg/dL (calc) (ref <130)
Total CHOL/HDL Ratio: 3.3 (calc) (ref <5.0)
Triglycerides: 137 mg/dL (ref <150)

## 2018-11-14 LAB — TSH: TSH: 1.96 mIU/L (ref 0.40–4.50)

## 2018-11-23 ENCOUNTER — Encounter: Payer: Self-pay | Admitting: "Endocrinology

## 2018-11-23 ENCOUNTER — Other Ambulatory Visit: Payer: Self-pay

## 2018-11-23 ENCOUNTER — Ambulatory Visit (INDEPENDENT_AMBULATORY_CARE_PROVIDER_SITE_OTHER): Payer: Medicare HMO | Admitting: "Endocrinology

## 2018-11-23 DIAGNOSIS — E291 Testicular hypofunction: Secondary | ICD-10-CM | POA: Insufficient documentation

## 2018-11-23 DIAGNOSIS — E782 Mixed hyperlipidemia: Secondary | ICD-10-CM

## 2018-11-23 DIAGNOSIS — I10 Essential (primary) hypertension: Secondary | ICD-10-CM

## 2018-11-23 DIAGNOSIS — E1159 Type 2 diabetes mellitus with other circulatory complications: Secondary | ICD-10-CM | POA: Diagnosis not present

## 2018-11-23 NOTE — Progress Notes (Signed)
11/23/2018, 5:10 PM                                                    Endocrinology Telehealth Visit Follow up Note -During COVID -19 Pandemic  This visit type was conducted due to national recommendations for restrictions regarding the COVID-19 Pandemic  in an effort to limit this patient's exposure and mitigate transmission of the corona virus.  Due to his co-morbid illnesses, Jonathan Gilmore is at  moderate to high risk for complications without adequate follow up.  This format is felt to be most appropriate for him at this time.  I connected with this patient on 11/23/2018   by telephone and verified that I am speaking with the correct person using two identifiers. Jonathan Gilmore, [birth year]. he has verbally consented to this visit. All issues noted in this document were discussed and addressed. The format was not optimal for physical exam.    Subjective:    Patient ID: Jonathan Gilmore, male    DOB: Jul 21, 1950.  Jonathan Gilmore is being engaged in telehealth via telephone in follow-up for management of currently uncontrolled symptomatic diabetes requested by  Maryruth Hancock, MD.   Past Medical History:  Diagnosis Date  . Abnormal cardiovascular stress test 03/2013  . Anxiety   . Arthritis   . Back pain   . CHF (congestive heart failure) ([place])   . Coronary artery disease    Multivessel with significant LAD involvement by chest CT 2014  . Depression   . Diastolic dysfunction   . Essential hypertension   . Lumbar herniated disc    L4-L5  . MRSA (methicillin resistant staph aureus) culture positive   . Shortness of breath dyspnea   . Sleep apnea    No cpap  . Type 2 diabetes mellitus ([place])     Past Surgical History:  Procedure Laterality Date  . ANTERIOR CERVICAL DECOMP/DISCECTOMY FUSION  01/18/2011   Procedure: ANTERIOR CERVICAL DECOMPRESSION/DISCECTOMY FUSION 2 LEVELS;  Surgeon: Cooper Render Pool;   Location: [facility];  Service: Neurosurgery;  Laterality: N/A;  anterior cervical discectomy and fusion with allograft and plating, cervical five-six, cervical six-seven  . BIOPSY N/A 04/04/2015   Procedure: BIOPSY;  Surgeon: Rogene Houston, MD;  [facility];  Service: Endoscopy;  Laterality: N/A;  . Bone spur removed  1997   Left shoulder  . CARDIAC CATHETERIZATION N/A 08/08/2015   Procedure: Right/Left Heart Cath and Coronary Angiography;  Surgeon: Sherren Mocha, MD;  Location: [facility];  Service: Cardiovascular;  Laterality: N/A;  . CARPAL TUNNEL RELEASE Right   . ESOPHAGEAL DILATION N/A 04/04/2015   Procedure: ESOPHAGEAL DILATION;  Surgeon: Rogene Houston, MD;  [facility];  Service: Endoscopy;  Laterality: N/A;  . ESOPHAGOGASTRODUODENOSCOPY N/A 04/04/2015   Procedure: ESOPHAGOGASTRODUODENOSCOPY (EGD);  Surgeon: Rogene Houston, MD;  [facility];  Service: Endoscopy;  Laterality: N/A;  1240  . HEMORRHOID SURGERY    .  TONSILLECTOMY      Social History   Socioeconomic History  . Marital status: Married    Spouse name: Not on file  . Number of children: Not on file  . Years of education: Not on file  . Highest education level: Not on file  Occupational History  . Not on file  Social Needs  . Financial resource strain: Not on file  . Food insecurity    Worry: Not on file    Inability: Not on file  . Transportation needs    Medical: Not on file    Non-medical: Not on file  Tobacco Use  . Smoking status: Current Every Day Smoker    Packs/day: 1.00    Years: 50.00    Pack years: 50.00    Types: Cigarettes  . Smokeless tobacco: Former Systems developer  . Tobacco comment: smoking x 50 yrs  Substance and Sexual Activity  . Alcohol use: Yes    Alcohol/week: 1.0 standard drinks    Types: 1 Standard drinks or equivalent per week    Comment: rare  . Drug use: Yes    Types: Marijuana    Comment: as needed  . Sexual activity: Yes  Lifestyle   . Physical activity    Days per week: Not on file    Minutes per session: Not on file  . Stress: Not on file  Relationships  . Social Herbalist on phone: Not on file    Gets together: Not on file    Attends religious service: Not on file    Active member of club or organization: Not on file    Attends meetings of clubs or organizations: Not on file    Relationship status: Not on file  Other Topics Concern  . Not on file  Social History Narrative  . Not on file    Family History  Problem Relation Age of Onset  . Lung cancer Mother   . Diabetes Father   . Hepatitis B Father   . Diabetes Sister   . Cushing syndrome Sister     Outpatient Encounter Medications as of 11/23/2018  Medication Sig  . aspirin EC 81 MG tablet Take 81 mg by mouth daily.  Marland Kitchen atorvastatin (LIPITOR) 80 MG tablet Take 1 tablet (80 mg total) by mouth at bedtime.  Marland Kitchen CALCIUM-MAGNESIUM-ZINC PO Take 1 tablet by mouth daily.  . diphenhydrAMINE (BENADRYL) 25 MG tablet Take 100 mg by mouth at bedtime.  . Flaxseed, Linseed, (FLAXSEED OIL PO) Take 1 tablet by mouth daily.   . furosemide (LASIX) 40 MG tablet TAKE 1 TABLET BY MOUTH EVERY DAY  . gabapentin (NEURONTIN) 300 MG capsule Take 1 capsule (300 mg total) by mouth at bedtime. (Patient taking differently: Take 600 mg by mouth at bedtime. )  . ibuprofen (ADVIL,MOTRIN) 200 MG tablet Take 800 mg by mouth daily as needed for moderate pain.  . Insulin Glargine (LANTUS SOLOSTAR) 100 UNIT/ML Solostar Pen Inject 26 Units into the skin at bedtime.  . insulin lispro (HUMALOG) 100 UNIT/ML KwikPen Inject 1 Units into the skin 3 (three) times daily before meals.  Marland Kitchen lisinopril (PRINIVIL,ZESTRIL) 5 MG tablet TAKE 1 TABLET BY MOUTH EVERY DAY  . metFORMIN (GLUCOPHAGE) 500 MG tablet Take 1 tablet (500 mg total) by mouth 2 (two) times daily.  . metoprolol succinate (TOPROL-XL) 25 MG 24 hr tablet TAKE 1 TABLET BY MOUTH EVERY DAY (Patient taking differently: Take 25 mg by  mouth at bedtime. )  . Multiple Vitamin (  MULTIVITAMIN WITH MINERALS) TABS Take 1 tablet by mouth daily.  . nitroGLYCERIN (NITROSTAT) 0.4 MG SL tablet Place 1 tablet (0.4 mg total) under the tongue every 5 (five) minutes as needed for chest pain.  Marland Kitchen testosterone cypionate (DEPOTESTOSTERONE CYPIONATE) 200 MG/ML injection Inject 200 mg into the muscle every 14 (fourteen) days.    No facility-administered encounter medications on file as of 11/23/2018.     ALLERGIES: Allergies  Allergen Reactions  . Morphine And Related Nausea And Vomiting    VACCINATION STATUS: Immunization History  Administered Date(s) Administered  . Influenza Split 01/19/2011  . Influenza,inj,Quad PF,6+ Mos 02/25/2013  . Pneumococcal Polysaccharide-23 02/25/2013    Diabetes He presents for his follow-up diabetic visit. He has type 2 diabetes mellitus. His disease course has been improving. There are no hypoglycemic associated symptoms. Pertinent negatives for hypoglycemia include no confusion, headaches, pallor or seizures. Pertinent negatives for diabetes include no chest pain, no fatigue, no polydipsia, no polyphagia, no polyuria and no weakness. There are no hypoglycemic complications. Symptoms are improving. Risk factors for coronary artery disease include diabetes mellitus, male sex, tobacco exposure, sedentary lifestyle and obesity. He is following a generally unhealthy diet. When asked about meal planning, he reported none. He has not had a previous visit with a dietitian. He never participates in exercise. His breakfast blood glucose range is generally 130-140 mg/dl. His lunch blood glucose range is generally 130-140 mg/dl. His dinner blood glucose range is generally 130-140 mg/dl. His bedtime blood glucose range is generally 130-140 mg/dl. His overall blood glucose range is 130-140 mg/dl. (He is reporting near target glycemic profile with no major hypoglycemia nor hyperglycemia.  His recent A1c was 8.8%.) An ACE  inhibitor/angiotensin II receptor blocker is being taken. Eye exam is current.  Hypertension This is a chronic problem. The current episode started more than 1 year ago. The problem is uncontrolled. Pertinent negatives include no chest pain, headaches, neck pain, palpitations or shortness of breath. Risk factors for coronary artery disease include diabetes mellitus, male gender, obesity, sedentary lifestyle and smoking/tobacco exposure. Past treatments include ACE inhibitors.     Objective:    There were no vitals taken for this visit.  Wt Readings from Last 3 Encounters:  11/13/18 206 lb (93.4 kg)  10/25/18 206 lb 12.8 oz (93.8 kg)  03/21/18 205 lb (93 kg)      CMP ( most recent) CMP     Component Value Date/Time   NA 140 11/14/2018 1038   K 4.1 11/14/2018 1038   CL 106 11/14/2018 1038   CO2 28 11/14/2018 1038   GLUCOSE 177 (H) 11/14/2018 1038   BUN 10 11/14/2018 1038   CREATININE 0.72 11/14/2018 1038   CALCIUM 8.9 11/14/2018 1038   PROT 6.4 11/14/2018 1038   ALBUMIN 3.7 08/08/2015 0335   AST 17 11/14/2018 1038   ALT 16 11/14/2018 1038   ALKPHOS 74 08/08/2015 0335   BILITOT 0.5 11/14/2018 1038   GFRNONAA 97 11/14/2018 1038   GFRAA 112 11/14/2018 1038     Diabetic Labs (most recent): Lab Results  Component Value Date   HGBA1C 8.8 (A) 11/13/2018   HGBA1C 9.5 (H) 01/16/2018   HGBA1C 8.4 (H) 08/07/2015     Lipid Panel ( most recent) Lipid Panel     Component Value Date/Time   CHOL 82 11/14/2018 1038   TRIG 137 11/14/2018 1038   HDL 25 (L) 11/14/2018 1038   CHOLHDL 3.3 11/14/2018 1038   VLDL 39 08/07/2015 0509   LDLCALC 35  11/14/2018 1038      Lab Results  Component Value Date   TSH 1.96 11/14/2018   TSH 0.421 09/19/2013      Assessment & Plan:   1. DM type 2 causing vascular disease ([place])  - Jonathan Gilmore has currently uncontrolled symptomatic type 2 DM since 68 years of age,  with most recent A1c of 8.8 %. Recent labs reviewed. -He reports near  target glycemic profile on his recently adjusted insulin doses.   -his diabetes is complicated by coronary artery disease,  heavy smoking, and he remains at a high risk for more acute and chronic complications which include CAD, CVA, CKD, retinopathy, and neuropathy. These are all discussed in detail with him.  - I have counseled him on diet management and weight loss, by adopting a carbohydrate restricted/protein rich diet.  - he  admits there is a room for improvement in his diet and drink choices. -  Suggestion is made for him to avoid simple carbohydrates  from his diet including Cakes, Sweet Desserts / Pastries, Ice Cream, Soda (diet and regular), Sweet Tea, Candies, Chips, Cookies, Sweet Pastries,  Store Bought Juices, Alcohol in Excess of  1-2 drinks a day, Artificial Sweeteners, Coffee Creamer, and "Sugar-free" Products. This will help patient to have stable blood glucose profile and potentially avoid unintended weight gain.  - I encouraged him to switch to  unprocessed or minimally processed complex starch and increased protein intake (animal or plant source), fruits, and vegetables.  - he is advised to stick to a routine mealtimes to eat 3 meals  a day and avoid unnecessary snacks ( to snack only to correct hypoglycemia).   - he will be scheduled with Jearld Fenton, RDN, CDE for diabetes education.  - I have approached him with the following individualized plan to manage  his diabetes and patient agrees:   -He is advised to increase his [place] to 26 units nightly, continue monitoring blood glucose 2 times a day-daily before breakfast and at bedtime.  He is advised to stay off of Humalog for now. - he is encouraged to call clinic for blood glucose levels less than 70 or above 200 mg /dl. - he is advised to continue metformin 500 mg p.o. twice daily, therapeutically suitable for patient. He is not a candidate for SGLT2 inhibitors nor incretin therapy given his chronic heavy  smoking.   - Patient specific target  A1c;  LDL, HDL, Triglycerides, and  Waist Circumference were discussed in detail.  2) Blood Pressure /Hypertension:   he is advised to home monitor blood pressure and report if > 140/90 on 2 separate readings.   he is advised to continue his current medications including lisinopril 5 mg p.o. daily with breakfast .  3) Lipids/Hyperlipidemia:   Review of his recent lipid panel showed  controlled  LDL at 55 .  he  is advised to continue   atorvastatin 80 mg p.o. daily at bedtime.  Side effects and precautions discussed with him.  4)  Weight/Diet: His BMI is 32-   clearly complicating his diabetes care.  I discussed with him the fact that loss of 5 - 10% of his  current body weight will have the most impact on his diabetes management.  CDE Consult will be initiated . Exercise, and detailed carbohydrates information provided  -  detailed on discharge instructions.   5) hypogonadism: The circumstance of these diagnoses are not available for review.  He is currently on testosterone 200 mg IM  every 14 days.  He does not have recent testosterone measurements. Will be considered for fasting testosterone measurement for his next visit.   he is advised to continue the same dosing regimen at this time.  -He has an upcoming consultation for urology, encouraged  to keep.  6) Chronic Care/Health Maintenance:  -he  is on ACEI/ARB and Statin medications and  is encouraged to initiate and continue to follow up with Ophthalmology, Dentist,  Podiatrist at least yearly or according to recommendations, and advised to  Quit smoking. I have recommended yearly flu vaccine and pneumonia vaccine at least every 5 years; moderate intensity exercise for up to 150 minutes weekly; and  sleep for at least 7 hours a day.  - he is  advised to maintain close follow up with [place], Rex Kras, MD for primary care needs, as well as his other providers for optimal and coordinated care.  - Patient  Care Time Today:  25 min, of which >50% was spent in  counseling and the rest reviewing his  current and  previous labs/studies, previous treatments, his blood glucose readings, and medications' doses and developing a plan for long-term care based on the latest recommendations for standards of care.   Jonathan Gilmore participated in the discussions, expressed understanding, and voiced agreement with the above plans.  All questions were answered to his satisfaction. he is encouraged to contact clinic should he have any questions or concerns prior to his return visit.   Follow up plan: - Return in about 3 months (around 02/22/2019) for Follow up with Pre-visit Labs, Next Visit A1c in Office.  Glade Lloyd, MD [facility] [address] [place], [place] [address] Phone: [phone]  Fax: [phone]    11/23/2018, 5:10 PM  This note was partially dictated with voice recognition software. Similar sounding words can be transcribed inadequately or may not  be corrected upon review.

## 2018-11-27 ENCOUNTER — Ambulatory Visit: Payer: Medicare HMO | Admitting: Family Medicine

## 2018-11-28 ENCOUNTER — Other Ambulatory Visit: Payer: Self-pay | Admitting: Cardiology

## 2018-11-28 ENCOUNTER — Ambulatory Visit: Payer: Medicare HMO | Admitting: "Endocrinology

## 2018-11-29 ENCOUNTER — Telehealth: Payer: Self-pay | Admitting: Family Medicine

## 2018-11-29 ENCOUNTER — Ambulatory Visit: Payer: Medicare HMO | Admitting: Family Medicine

## 2018-11-29 ENCOUNTER — Other Ambulatory Visit: Payer: Self-pay | Admitting: Family Medicine

## 2018-11-29 ENCOUNTER — Other Ambulatory Visit: Payer: Self-pay

## 2018-11-29 VITALS — Temp 98.4°F | Ht 67.0 in | Wt 204.8 lb

## 2018-11-29 DIAGNOSIS — R69 Illness, unspecified: Secondary | ICD-10-CM | POA: Diagnosis not present

## 2018-11-29 DIAGNOSIS — G4730 Sleep apnea, unspecified: Secondary | ICD-10-CM | POA: Diagnosis not present

## 2018-11-29 DIAGNOSIS — F17200 Nicotine dependence, unspecified, uncomplicated: Secondary | ICD-10-CM | POA: Diagnosis not present

## 2018-11-29 DIAGNOSIS — K635 Polyp of colon: Secondary | ICD-10-CM | POA: Diagnosis not present

## 2018-11-29 DIAGNOSIS — Z23 Encounter for immunization: Secondary | ICD-10-CM

## 2018-11-29 DIAGNOSIS — I10 Essential (primary) hypertension: Secondary | ICD-10-CM

## 2018-11-29 DIAGNOSIS — Z794 Long term (current) use of insulin: Secondary | ICD-10-CM

## 2018-11-29 DIAGNOSIS — E7800 Pure hypercholesterolemia, unspecified: Secondary | ICD-10-CM | POA: Diagnosis not present

## 2018-11-29 DIAGNOSIS — E1100 Type 2 diabetes mellitus with hyperosmolarity without nonketotic hyperglycemic-hyperosmolar coma (NKHHC): Secondary | ICD-10-CM | POA: Diagnosis not present

## 2018-11-29 MED ORDER — ACCU-CHEK AVIVA PLUS VI STRP: ORAL_STRIP | 1 refills | Status: DC

## 2018-11-29 NOTE — Patient Instructions (Addendum)
Keep appt with cardiology, urology, sleep study, CT lung scan  GI will call about appointment regarding colonoscopy  Call back on type of strip needed for glucose monitor

## 2018-11-29 NOTE — Telephone Encounter (Signed)
Patient was seen in office today and called back to inform Dr. Holly Bodily his test strips are Accu Check Aviva Plus.

## 2018-11-29 NOTE — Progress Notes (Signed)
Established Patient Office Visit  Subjective:  Patient ID: Jonathan Gilmore, male    DOB: May 13, 1950  Age: 68 y.o. MRN: [id number]  CC:  Chief Complaint  Patient presents with  . Follow-up  HTN    HPI Reginal D Klutts presents for HTN-no CP/headaches/visual changes  Urology appt pending-pt has not heard from urology to date on appt  CT lung scheduled next week  Cardiology appt on Friday  Endo-Lantus 24units twice a day-glucose has improved-no concerns about increase in dose-8.8% A1c  Difficulty going to sleep and staying asleep.  Not currently using CPAP-mask out of date   Vascular surgeon states no surgery due to DM-painful with walking and rest  DDD lumbar disease  Past Medical History:  Diagnosis Date  . Abnormal cardiovascular stress test 03/2013  . Anxiety   . Arthritis   . Back pain   . CHF (congestive heart failure) ([place])   . Coronary artery disease    Multivessel with significant LAD involvement by chest CT 2014  . Depression   . Diastolic dysfunction   . Essential hypertension   . Lumbar herniated disc    L4-L5  . MRSA (methicillin resistant staph aureus) culture positive   . Shortness of breath dyspnea   . Sleep apnea    No cpap  . Type 2 diabetes mellitus ([place])     Past Surgical History:  Procedure Laterality Date  . ANTERIOR CERVICAL DECOMP/DISCECTOMY FUSION  01/18/2011   Procedure: ANTERIOR CERVICAL DECOMPRESSION/DISCECTOMY FUSION 2 LEVELS;  Surgeon: Cooper Render Pool;  Location: [place] [facility];  Service: Neurosurgery;  Laterality: N/A;  anterior cervical discectomy and fusion with allograft and plating, cervical five-six, cervical six-seven  . BIOPSY N/A 04/04/2015   Procedure: BIOPSY;  Surgeon: Rogene Houston, MD;  [facility];  Service: Endoscopy;  Laterality: N/A;  . Bone spur removed  1997   Left shoulder  . CARDIAC CATHETERIZATION N/A 08/08/2015   Procedure: Right/Left Heart Cath and Coronary Angiography;  Surgeon: Sherren Mocha, MD;   Location: [place] [facility];  Service: Cardiovascular;  Laterality: N/A;  . CARPAL TUNNEL RELEASE Right   . ESOPHAGEAL DILATION N/A 04/04/2015   Procedure: ESOPHAGEAL DILATION;  Surgeon: Rogene Houston, MD;  [facility];  Service: Endoscopy;  Laterality: N/A;  . ESOPHAGOGASTRODUODENOSCOPY N/A 04/04/2015   Procedure: ESOPHAGOGASTRODUODENOSCOPY (EGD);  Surgeon: Rogene Houston, MD;  [facility];  Service: Endoscopy;  Laterality: N/A;  1240  . HEMORRHOID SURGERY    . TONSILLECTOMY      Family History  Problem Relation Age of Onset  . Lung cancer Mother   . Diabetes Father   . Hepatitis B Father   . Diabetes Sister   . Cushing syndrome Sister     Social History   Socioeconomic History  . Marital status: Married    Spouse name: Not on file  . Number of children: Not on file  . Years of education: Not on file  . Highest education level: Not on file  Occupational History  . Not on file  Social Needs  . Financial resource strain: Not on file  . Food insecurity    Worry: Not on file    Inability: Not on file  . Transportation needs    Medical: Not on file    Non-medical: Not on file  Tobacco Use  . Smoking status: Current Every Day Smoker    Packs/day: 1.00    Years: 50.00    Pack years:  50.00    Types: Cigarettes  . Smokeless tobacco: Former Systems developer  . Tobacco comment: smoking x 50 yrs  Substance and Sexual Activity  . Alcohol use: Yes    Alcohol/week: 1.0 standard drinks    Types: 1 Standard drinks or equivalent per week    Comment: rare  . Drug use: Yes    Types: Marijuana    Comment: as needed  . Sexual activity: Yes  Lifestyle  . Physical activity    Days per week: Not on file    Minutes per session: Not on file  . Stress: Not on file  Relationships  . Social Herbalist on phone: Not on file    Gets together: Not on file    Attends religious service: Not on file    Active member of club or organization: Not on file    Attends  meetings of clubs or organizations: Not on file    Relationship status: Not on file  . Intimate partner violence    Fear of current or ex partner: Not on file    Emotionally abused: Not on file    Physically abused: Not on file    Forced sexual activity: Not on file  Other Topics Concern  . Not on file  Social History Narrative  . Not on file    Outpatient Medications Prior to Visit  Medication Sig Dispense Refill  . aspirin EC 81 MG tablet Take 81 mg by mouth daily.    Marland Kitchen atorvastatin (LIPITOR) 80 MG tablet TAKE 1 TABLET BY MOUTH EVERYDAY AT BEDTIME 90 tablet 1  . CALCIUM-MAGNESIUM-ZINC PO Take 1 tablet by mouth daily.    . diphenhydrAMINE (BENADRYL) 25 MG tablet Take 100 mg by mouth at bedtime.    . Flaxseed, Linseed, (FLAXSEED OIL PO) Take 1 tablet by mouth daily.     . furosemide (LASIX) 40 MG tablet TAKE 1 TABLET BY MOUTH EVERY DAY 90 tablet 3  . gabapentin (NEURONTIN) 300 MG capsule Take 1 capsule (300 mg total) by mouth at bedtime. (Patient taking differently: Take 600 mg by mouth at bedtime. ) 30 capsule 0  . ibuprofen (ADVIL,MOTRIN) 200 MG tablet Take 800 mg by mouth daily as needed for moderate pain.    . Insulin Glargine (LANTUS SOLOSTAR) 100 UNIT/ML Solostar Pen Inject 26 Units into the skin at bedtime.    . insulin lispro (HUMALOG) 100 UNIT/ML KwikPen Inject 1 Units into the skin 3 (three) times daily before meals.    Marland Kitchen lisinopril (PRINIVIL,ZESTRIL) 5 MG tablet TAKE 1 TABLET BY MOUTH EVERY DAY 90 tablet 3  . metFORMIN (GLUCOPHAGE) 500 MG tablet Take 1 tablet (500 mg total) by mouth 2 (two) times daily.    . metoprolol succinate (TOPROL-XL) 25 MG 24 hr tablet TAKE 1 TABLET BY MOUTH EVERY DAY (Patient taking differently: Take 25 mg by mouth at bedtime. ) 90 tablet 3  . Multiple Vitamin (MULTIVITAMIN WITH MINERALS) TABS Take 1 tablet by mouth daily.    . nitroGLYCERIN (NITROSTAT) 0.4 MG SL tablet Place 1 tablet (0.4 mg total) under the tongue every 5 (five) minutes as needed for  chest pain. 25 tablet 4  . testosterone cypionate (DEPOTESTOSTERONE CYPIONATE) 200 MG/ML injection Inject 200 mg into the muscle every 14 (fourteen) days.   5   No facility-administered medications prior to visit.     Allergies  Allergen Reactions  . Morphine And Related Nausea And Vomiting    ROS Review of Systems  Constitutional: Positive  for fatigue.  Respiratory: Negative for shortness of breath.   Cardiovascular: Negative for chest pain.  Musculoskeletal: Positive for back pain.       Foot pain  Psychiatric/Behavioral: Positive for sleep disturbance.      Objective:    Physical Exam  Constitutional: He appears well-developed and well-nourished. No distress.  Cardiovascular: Normal rate and regular rhythm.  Pulmonary/Chest: Effort normal and breath sounds normal.   Body mass index is 32.08 kg/m. Temp 98.4 F (36.9 C) (Oral)   Ht 5\' 7"  (1.702 m)   Wt 204 lb 12.8 oz (92.9 kg)   SpO2 96%   BMI 32.08 kg/m  Wt Readings from Last 3 Encounters:  11/29/18 204 lb 12.8 oz (92.9 kg)  11/13/18 206 lb (93.4 kg)  10/25/18 206 lb 12.8 oz (93.8 kg)     Health Maintenance Due  Topic Date Due  . Hepatitis C Screening  January 06, 1951  . FOOT EXAM  11/25/1960  . OPHTHALMOLOGY EXAM  11/25/1960  . TETANUS/TDAP  11/25/1969  . COLONOSCOPY  11/25/2000  . PNA vac Low Risk Adult (1 of 2 - PCV13) 11/26/2015  . INFLUENZA VACCINE  10/14/2018    Lab Results  Component Value Date   TSH 1.96 11/14/2018   Lab Results  Component Value Date   WBC 9.5 11/14/2018   HGB 15.9 11/14/2018   HCT 48.5 11/14/2018   MCV 89.8 11/14/2018   PLT 227 11/14/2018   Lab Results  Component Value Date   NA 140 11/14/2018   K 4.1 11/14/2018   CO2 28 11/14/2018   GLUCOSE 177 (H) 11/14/2018   BUN 10 11/14/2018   CREATININE 0.72 11/14/2018   BILITOT 0.5 11/14/2018   ALKPHOS 74 08/08/2015   AST 17 11/14/2018   ALT 16 11/14/2018   PROT 6.4 11/14/2018   ALBUMIN 3.7 08/08/2015   CALCIUM 8.9  11/14/2018   ANIONGAP 6 08/08/2015   Lab Results  Component Value Date   CHOL 82 11/14/2018   Lab Results  Component Value Date   HDL 25 (L) 11/14/2018   Lab Results  Component Value Date   LDLCALC 35 11/14/2018   Lab Results  Component Value Date   TRIG 137 11/14/2018   Lab Results  Component Value Date   CHOLHDL 3.3 11/14/2018   Lab Results  Component Value Date   HGBA1C 8.8 (A) 11/13/2018      Assessment & Plan:  1. Tobacco dependence Encouraged pt to quit 2. Essential hypertension Keep appt with cardiology High risk CAD-tob use, hyperlipidemia, HTN. Sleep apnea 3. Polyp of colon, unspecified part of colon, unspecified type - Ambulatory referral to Gastroenterology  4. Pure hypercholesterolemia Flaxseed, lipitor  5. Type 2 diabetes mellitus with hyperosmolarity without coma, with long-term current use of insulin ([place]) Seen by endo Foot exam today Need glucose monitor-call with name of strips 6. Sleep apnea, unspecified type - Ambulatory referral to Sleep Studies Follow-up: 3 months LISA Hannah Beat, MD

## 2018-11-30 DIAGNOSIS — R69 Illness, unspecified: Secondary | ICD-10-CM | POA: Diagnosis not present

## 2018-11-30 NOTE — Telephone Encounter (Signed)
DM-insulin dependent

## 2018-12-01 ENCOUNTER — Ambulatory Visit: Payer: Medicare HMO | Admitting: Cardiology

## 2018-12-01 ENCOUNTER — Other Ambulatory Visit: Payer: Self-pay

## 2018-12-01 NOTE — Progress Notes (Deleted)
Clinical Summary Jonathan Gilmore is a 68 y.o.male  seen today for follow up of the following medical problems.   1. CAD - CT scan chest 02/2013 with 3 vessel CAD, severe LAD disease. - Jan 2015 nuclear stress without clear ischemia - admit 07/2015 with acute pulmonary edema in setting of severe HTN. Referred for cath.  - cath 07/2015 with 50% LAD disease, overall patent coronaries - his presentation with pulmonary edema likely secondary to severe HTN in setting of restrictive diastolic dysfunction. - echo 07/2015 LVEF 45-50%, restrictive diastolic dysfunction    - no recent chest pain. No SOB/DOE - does heavy yardwork regularly without symptoms. - can walk up flight of stairs without troubles.   2. Severe OSA - not using CPAP machine due to discomfort. Stopped several years ago - last visit referred to Dr Luan Pulling.  3. Hyperlipidemia - 07/2017 TC 169 TG 294 HDL 28 LDL 82 - compliant with statin. Elevated TGs in setting of poorly controlled DM2  4. Aortic stenosis - echo 07/2015 mild to moderate. Mean gradient 16, AVA reported at 0.9 with dimensionless index 0.3 - denies any recent symptoms  5. HTN - he is compliant with meds  6. Chronic diastolic HF - echo 123456 with restrictive diastolic dysfunction. Low normal to mildly decreased LVEF at 45-50%.  -no recent symptoms, no edema  7. DM2 - followed by pcp   8. Claudication/PAD - followed by vascular   9. Preoperative evaluation - being considered for back surgery     SH: his wife, Jonathan Gilmore is also a patient of mine.   Past Medical History:  Diagnosis Date  . Abnormal cardiovascular stress test 03/2013  . Anxiety   . Arthritis   . Back pain   . CHF (congestive heart failure) ([place])   . Coronary artery disease    Multivessel with significant LAD involvement by chest CT 2014  . Depression   . Diastolic dysfunction   . Essential hypertension   . Lumbar herniated disc    L4-L5  .  MRSA (methicillin resistant staph aureus) culture positive   . Shortness of breath dyspnea   . Sleep apnea    No cpap  . Type 2 diabetes mellitus (H[facility])      Allergies  Allergen Reactions  . Morphine And Related Nausea And Vomiting     Current Outpatient Medications  Medication Sig Dispense Refill  . ACCU-CHEK AVIVA PLUS test strip USE AS INSTRUCTED 200 strip 1  . aspirin EC 81 MG tablet Take 81 mg by mouth daily.    Marland Kitchen atorvastatin (LIPITOR) 80 MG tablet TAKE 1 TABLET BY MOUTH EVERYDAY AT BEDTIME 90 tablet 1  . CALCIUM-MAGNESIUM-ZINC PO Take 1 tablet by mouth daily.    . diphenhydrAMINE (BENADRYL) 25 MG tablet Take 100 mg by mouth at bedtime.    . Flaxseed, Linseed, (FLAXSEED OIL PO) Take 1 tablet by mouth daily.     . furosemide (LASIX) 40 MG tablet TAKE 1 TABLET BY MOUTH EVERY DAY 90 tablet 3  . gabapentin (NEURONTIN) 300 MG capsule Take 1 capsule (300 mg total) by mouth at bedtime. (Patient taking differently: Take 600 mg by mouth at bedtime. ) 30 capsule 0  . ibuprofen (ADVIL,MOTRIN) 200 MG tablet Take 800 mg by mouth daily as needed for moderate pain.    . Insulin Glargine (LANTUS SOLOSTAR) 100 UNIT/ML Solostar Pen Inject 26 Units into the skin at bedtime.    . insulin lispro (HUMALOG) 100 UNIT/ML KwikPen Inject  1 Units into the skin 3 (three) times daily before meals.    Marland Kitchen lisinopril (PRINIVIL,ZESTRIL) 5 MG tablet TAKE 1 TABLET BY MOUTH EVERY DAY 90 tablet 3  . metFORMIN (GLUCOPHAGE) 500 MG tablet Take 1 tablet (500 mg total) by mouth 2 (two) times daily.    . metoprolol succinate (TOPROL-XL) 25 MG 24 hr tablet TAKE 1 TABLET BY MOUTH EVERY DAY (Patient taking differently: Take 25 mg by mouth at bedtime. ) 90 tablet 3  . Multiple Vitamin (MULTIVITAMIN WITH MINERALS) TABS Take 1 tablet by mouth daily.    . nitroGLYCERIN (NITROSTAT) 0.4 MG SL tablet Place 1 tablet (0.4 mg total) under the tongue every 5 (five) minutes as needed for chest pain. 25 tablet 4  . testosterone cypionate  (DEPOTESTOSTERONE CYPIONATE) 200 MG/ML injection Inject 200 mg into the muscle every 14 (fourteen) days.   5   No current facility-administered medications for this visit.      Past Surgical History:  Procedure Laterality Date  . ANTERIOR CERVICAL DECOMP/DISCECTOMY FUSION  01/18/2011   Procedure: ANTERIOR CERVICAL DECOMPRESSION/DISCECTOMY FUSION 2 LEVELS;  Surgeon: Cooper Render Pool;  Location: [place] [facility];  Service: Neurosurgery;  Laterality: N/A;  anterior cervical discectomy and fusion with allograft and plating, cervical five-six, cervical six-seven  . BIOPSY N/A 04/04/2015   Procedure: BIOPSY;  Surgeon: Rogene Houston, MD;  [facility];  Service: Endoscopy;  Laterality: N/A;  . Bone spur removed  1997   Left shoulder  . CARDIAC CATHETERIZATION N/A 08/08/2015   Procedure: Right/Left Heart Cath and Coronary Angiography;  Surgeon: Sherren Mocha, MD;  Location: [facility];  Service: Cardiovascular;  Laterality: N/A;  . CARPAL TUNNEL RELEASE Right   . ESOPHAGEAL DILATION N/A 04/04/2015   Procedure: ESOPHAGEAL DILATION;  Surgeon: Rogene Houston, MD;  [facility];  Service: Endoscopy;  Laterality: N/A;  . ESOPHAGOGASTRODUODENOSCOPY N/A 04/04/2015   Procedure: ESOPHAGOGASTRODUODENOSCOPY (EGD);  Surgeon: Rogene Houston, MD;  [facility];  Service: Endoscopy;  Laterality: N/A;  1240  . HEMORRHOID SURGERY    . TONSILLECTOMY       Allergies  Allergen Reactions  . Morphine And Related Nausea And Vomiting      Family History  Problem Relation Age of Onset  . Lung cancer Mother   . Diabetes Father   . Hepatitis B Father   . Diabetes Sister   . Cushing syndrome Sister      Social History Jonathan Gilmore reports that he has been smoking cigarettes. He has a 50.00 pack-year smoking history. He has quit using smokeless tobacco. Jonathan Gilmore reports current alcohol use of about 1.0 standard drinks of alcohol per week.   Review of Systems CONSTITUTIONAL:  No weight loss, fever, chills, weakness or fatigue.  HEENT: Eyes: No visual loss, blurred vision, double vision or yellow sclerae.No hearing loss, sneezing, congestion, runny nose or sore throat.  SKIN: No rash or itching.  CARDIOVASCULAR:  RESPIRATORY: No shortness of breath, cough or sputum.  GASTROINTESTINAL: No anorexia, nausea, vomiting or diarrhea. No abdominal pain or blood.  GENITOURINARY: No burning on urination, no polyuria NEUROLOGICAL: No headache, dizziness, syncope, paralysis, ataxia, numbness or tingling in the extremities. No change in bowel or bladder control.  MUSCULOSKELETAL: No muscle, back pain, joint pain or stiffness.  LYMPHATICS: No enlarged nodes. No history of splenectomy.  PSYCHIATRIC: No history of depression or anxiety.  ENDOCRINOLOGIC: No reports of sweating, cold or heat intolerance. No polyuria or polydipsia.  Marland Kitchen  Physical Examination There were no vitals filed for this visit. There were no vitals filed for this visit.  Gen: resting comfortably, no acute distress HEENT: no scleral icterus, pupils equal round and reactive, no palptable cervical adenopathy,  CV Resp: Clear to auscultation bilaterally GI: abdomen is soft, non-tender, non-distended, normal bowel sounds, no hepatosplenomegaly MSK: extremities are warm, no edema.  Skin: warm, no rash Neuro:  no focal deficits Psych: appropriate affect   Diagnostic Studies  Jan 2015 MPI IMPRESSION: 1. Abnormal exercise myocardial perfusion imaging stress test  2. Inferior wall defect likely due to subdiaphragmatic attenuation, its wall motion is similar to other areas without a perfusion defect.  3. Likely small anteroseptal infract, this area is more hypokinetic than the rest of the myocardium  3. Low left ventricular systolic function with global hypokinesis  4. Mildly reduced functional capacity (90% of age and gender predicted)  5. Overall increased risk for major cardiac events  based on low ejection fraction. There is no current myocardium at jeopardy.   02/2013 echo Study Conclusions  - Left ventricle: The cavity size was normal. There was mild concentric hypertrophy. Systolic function was normal. The estimated ejection fraction was in the range of 55% to 60%. Wall motion was normal; there were no regional wall motion abnormalities. There is mildly asynchronous contraction consistent with intraventricular conduction delay (IVCD) or bundle Crystalle Popwell block. Doppler parameters are consistent with abnormal left ventricular relaxation (grade 1 diastolic dysfunction). - Aortic valve: Cusp separation was mildly reduced. There was mild to moderate stenosis. - Left atrium: The atrium was moderately dilated.  07/2015 echo  Study Conclusions  - Left ventricle: The cavity size was normal. Wall thickness was  increased in a pattern of mild LVH. Systolic function was mildly  reduced. The estimated ejection fraction was in the range of 45%  to 50%. Diffuse hypokinesis. Doppler parameters are consistent  with restrictive physiology, indicative of decreased left  ventricular diastolic compliance and/or increased left atrial  pressure. Doppler parameters are consistent with high ventricular  filling pressure. - Aortic valve: Valve mobility was restricted. There was mild to  moderate stenosis. Valve area (VTI): 0.96 cm^2. Valve area  (Vmax): 0.91 cm^2. Valve area (Vmean): 0.89 cm^2. - Mitral valve: Calcified annulus. There was mild regurgitation. - Left atrium: The atrium was moderately dilated. - Right atrium: The atrium was mildly dilated. - Atrial septum: There was a patent foramen ovale.  Impressions:  - Mild global reduction in LV function; restrictive filling with  elevated filling pressure; mild LVH; biatrial enlargement;  heavily calcified aortic valve with probable mild to moderate AS  (mean gradient 16 mmHg; calculated  AVA .9 cm2 likely  overestimates severity; dimensionless index .3 not supportive of  severe AS); mild MR; patent foramen ovale.  07/2015 Cath  Prox LAD to Mid LAD lesion, 50% stenosed.  1. Calcified moderate proximal-mid LAD stenosis 2. Widely patent, dominant LCx 3. Preserved cardiac output, mildly elevated right-sided intracardiac pressures  Recommend: Medical therapy for nonobstructive CAD and CHF  01/2016 AAA [place] Screen No aneurysm     Assessment and Plan  1. CAD - moderate nonobstructive disease bypriorcath. - denies any recent symptoms, continue current meds   2. Hyperlipidemia -continue statin, reasonable LDL. If peristently above 70 may consider changing to crestor in the future.    4. Aortic stenosis - mild to moderate by most recent echo -repeat echo for continued surveillance.   5. HTN -at goal, continue current meds  7. Chronic diasotlic heart  failure - no symptoms and currently euvolemic, continue to monitor  8. Preoperative evaluation - tolerates greater than 4METs regularly without limitation - follow up his surveillance echo for his history of aortic stenosis, if stable findings would plan to proceed with surgery if still needed      Arnoldo Lenis, M.D., F.A.C.C.

## 2018-12-04 ENCOUNTER — Encounter (INDEPENDENT_AMBULATORY_CARE_PROVIDER_SITE_OTHER): Payer: Self-pay | Admitting: *Deleted

## 2018-12-06 ENCOUNTER — Ambulatory Visit (HOSPITAL_COMMUNITY): Admission: RE | Admit: 2018-12-06 | Payer: Medicare HMO | Source: Ambulatory Visit

## 2018-12-08 ENCOUNTER — Ambulatory Visit: Payer: Medicare HMO | Admitting: Cardiology

## 2018-12-08 NOTE — Progress Notes (Deleted)
Clinical Summary Jonathan Gilmore is a 68 y.o.male seen today for follow up of the following medical problems.   1. CAD - CT scan chest 02/2013 with 3 vessel CAD, severe LAD disease. - Jan 2015 nuclear stress without clear ischemia - admit 07/2015 with acute pulmonary edema in setting of severe HTN. Referred for cath.  - cath 07/2015 with 50% LAD disease, overall patent coronaries - his presentation with pulmonary edema likely secondary to severe HTN in setting of restrictive diastolic dysfunction. - echo 07/2015 LVEF 45-50%, restrictive diastolic dysfunction    - no recent chest pain. No SOB/DOE - does heavy yardwork regularly without symptoms. - can walk up flight of stairs without troubles.   2. Severe OSA - not using CPAP machine due to discomfort. Stopped several years ago - last visit referred to Dr Jonathan Gilmore.  3. Hyperlipidemia - 07/2017 TC 169 TG 294 HDL 28 LDL 82 - compliant with statin. Elevated TGs in setting of poorly controlled DM2  4. Aortic stenosis - echo 07/2015 mild to moderate. Mean gradient 16, AVA reported at 0.9 with dimensionless index 0.3 - denies any recent symptoms  5. HTN - he is compliant with meds  6. Chronic diastolic HF - echo [id number] with restrictive diastolic dysfunction. Low normal to mildly decreased LVEF at 45-50%.  -no recent symptoms, no edema  7. DM2 - followed by pcp   8. Claudication/PAD - followed by vascular   9. Preoperative evaluation - being considered for back surgery     SH: his wife, Jonathan Gilmore is also a patient of mine.   Past Medical History:  Diagnosis Date  . Abnormal cardiovascular stress test 03/2013  . Anxiety   . Arthritis   . Back pain   . CHF (congestive heart failure) ([place] Gilmore)   . Coronary artery disease    Multivessel with significant LAD involvement by chest CT 2014  . Depression   . Diastolic dysfunction   . Essential hypertension   . Lumbar herniated disc    L4-L5  .  MRSA (methicillin resistant staph aureus) culture positive   . Shortness of breath dyspnea   . Sleep apnea    No cpap  . Type 2 diabetes mellitus ([facility])      Allergies  Allergen Reactions  . Morphine And Related Nausea And Vomiting     Current Outpatient Medications  Medication Sig Dispense Refill  . ACCU-CHEK AVIVA PLUS test strip USE AS INSTRUCTED 200 strip 1  . aspirin EC 81 MG tablet Take 81 mg by mouth daily.    Marland Kitchen atorvastatin (LIPITOR) 80 MG tablet TAKE 1 TABLET BY MOUTH EVERYDAY AT BEDTIME 90 tablet 1  . CALCIUM-MAGNESIUM-ZINC PO Take 1 tablet by mouth daily.    . diphenhydrAMINE (BENADRYL) 25 MG tablet Take 100 mg by mouth at bedtime.    . Flaxseed, Linseed, (FLAXSEED OIL PO) Take 1 tablet by mouth daily.     . furosemide (LASIX) 40 MG tablet TAKE 1 TABLET BY MOUTH EVERY DAY 90 tablet 3  . gabapentin (NEURONTIN) 300 MG capsule Take 1 capsule (300 mg total) by mouth at bedtime. (Patient taking differently: Take 600 mg by mouth at bedtime. ) 30 capsule 0  . ibuprofen (ADVIL,MOTRIN) 200 MG tablet Take 800 mg by mouth daily as needed for moderate pain.    . Insulin Glargine (LANTUS SOLOSTAR) 100 UNIT/ML Solostar Pen Inject 26 Units into the skin at bedtime.    . insulin lispro (HUMALOG) 100 UNIT/ML KwikPen Inject 1  Units into the skin 3 (three) times daily before meals.    Marland Kitchen lisinopril (PRINIVIL,ZESTRIL) 5 MG tablet TAKE 1 TABLET BY MOUTH EVERY DAY 90 tablet 3  . metFORMIN (GLUCOPHAGE) 500 MG tablet Take 1 tablet (500 mg total) by mouth 2 (two) times daily.    . metoprolol succinate (TOPROL-XL) 25 MG 24 hr tablet TAKE 1 TABLET BY MOUTH EVERY DAY (Patient taking differently: Take 25 mg by mouth at bedtime. ) 90 tablet 3  . Multiple Vitamin (MULTIVITAMIN WITH MINERALS) TABS Take 1 tablet by mouth daily.    . nitroGLYCERIN (NITROSTAT) 0.4 MG SL tablet Place 1 tablet (0.4 mg total) under the tongue every 5 (five) minutes as needed for chest pain. 25 tablet 4  . testosterone cypionate  (DEPOTESTOSTERONE CYPIONATE) 200 MG/ML injection Inject 200 mg into the muscle every 14 (fourteen) days.   5   No current facility-administered medications for this visit.      Past Surgical History:  Procedure Laterality Date  . ANTERIOR CERVICAL DECOMP/DISCECTOMY FUSION  01/18/2011   Procedure: ANTERIOR CERVICAL DECOMPRESSION/DISCECTOMY FUSION 2 LEVELS;  Surgeon: Jonathan Gilmore;  Location: [place] [facility];  Service: Neurosurgery;  Laterality: N/A;  anterior cervical discectomy and fusion with allograft and plating, cervical five-six, cervical six-seven  . BIOPSY N/A 04/04/2015   Procedure: BIOPSY;  Surgeon: Jonathan Houston, MD;  [facility];  Service: Endoscopy;  Laterality: N/A;  . Bone spur removed  1997   Left shoulder  . CARDIAC CATHETERIZATION N/A 08/08/2015   Procedure: Right/Left Heart Cath and Coronary Angiography;  Surgeon: Jonathan Mocha, MD;  Location: [place];  Service: Cardiovascular;  Laterality: N/A;  . CARPAL TUNNEL RELEASE Right   . ESOPHAGEAL DILATION N/A 04/04/2015   Procedure: ESOPHAGEAL DILATION;  Surgeon: Jonathan Houston, MD;  [facility];  Service: Endoscopy;  Laterality: N/A;  . ESOPHAGOGASTRODUODENOSCOPY N/A 04/04/2015   Procedure: ESOPHAGOGASTRODUODENOSCOPY (EGD);  Surgeon: Jonathan Houston, MD;  [facility];  Service: Endoscopy;  Laterality: N/A;  1240  . HEMORRHOID SURGERY    . TONSILLECTOMY       Allergies  Allergen Reactions  . Morphine And Related Nausea And Vomiting      Family History  Problem Relation Age of Onset  . Lung cancer Mother   . Diabetes Father   . Hepatitis B Father   . Diabetes Sister   . Cushing syndrome Sister      Social History Jonathan Gilmore reports that he has been smoking cigarettes. He has a 50.00 pack-year smoking history. He has quit using smokeless tobacco. Jonathan Gilmore reports current alcohol use of about 1.0 standard drinks of alcohol per week.   Review of Systems CONSTITUTIONAL:  No weight loss, fever, chills, weakness or fatigue.  HEENT: Eyes: No visual loss, blurred vision, double vision or yellow sclerae.No hearing loss, sneezing, congestion, runny nose or sore throat.  SKIN: No rash or itching.  CARDIOVASCULAR:  RESPIRATORY: No shortness of breath, cough or sputum.  GASTROINTESTINAL: No anorexia, nausea, vomiting or diarrhea. No abdominal pain or blood.  GENITOURINARY: No burning on urination, no polyuria NEUROLOGICAL: No headache, dizziness, syncope, paralysis, ataxia, numbness or tingling in the extremities. No change in bowel or bladder control.  MUSCULOSKELETAL: No muscle, back pain, joint pain or stiffness.  LYMPHATICS: No enlarged nodes. No history of splenectomy.  PSYCHIATRIC: No history of depression or anxiety.  ENDOCRINOLOGIC: No reports of sweating, cold or heat intolerance. No polyuria or polydipsia.  Marland Kitchen  Physical Examination There were no vitals filed for this visit. There were no vitals filed for this visit.  Gen: resting comfortably, no acute distress HEENT: no scleral icterus, pupils equal round and reactive, no palptable cervical adenopathy,  CV Resp: Clear to auscultation bilaterally GI: abdomen is soft, non-tender, non-distended, normal bowel sounds, no hepatosplenomegaly MSK: extremities are warm, no edema.  Skin: warm, no rash Neuro:  no focal deficits Psych: appropriate affect   Diagnostic Studies Jan 2015 MPI IMPRESSION: 1. Abnormal exercise myocardial perfusion imaging stress test  2. Inferior wall defect likely due to subdiaphragmatic attenuation, its wall motion is similar to other areas without a perfusion defect.  3. Likely small anteroseptal infract, this area is more hypokinetic than the rest of the myocardium  3. Low left ventricular systolic function with global hypokinesis  4. Mildly reduced functional capacity (90% of age and gender predicted)  5. Overall increased risk for major cardiac events based  on low ejection fraction. There is no current myocardium at jeopardy.   02/2013 echo Study Conclusions  - Left ventricle: The cavity size was normal. There was mild concentric hypertrophy. Systolic function was normal. The estimated ejection fraction was in the range of 55% to 60%. Wall motion was normal; there were no regional wall motion abnormalities. There is mildly asynchronous contraction consistent with intraventricular conduction delay (IVCD) or bundle  block. Doppler parameters are consistent with abnormal left ventricular relaxation (grade 1 diastolic dysfunction). - Aortic valve: Cusp separation was mildly reduced. There was mild to moderate stenosis. - Left atrium: The atrium was moderately dilated.  07/2015 echo  Study Conclusions  - Left ventricle: The cavity size was normal. Wall thickness was  increased in a pattern of mild LVH. Systolic function was mildly  reduced. The estimated ejection fraction was in the range of 45%  to 50%. Diffuse hypokinesis. Doppler parameters are consistent  with restrictive physiology, indicative of decreased left  ventricular diastolic compliance and/or increased left atrial  pressure. Doppler parameters are consistent with high ventricular  filling pressure. - Aortic valve: Valve mobility was restricted. There was mild to  moderate stenosis. Valve area (VTI): 0.96 cm^2. Valve area  (Vmax): 0.91 cm^2. Valve area (Vmean): 0.89 cm^2. - Mitral valve: Calcified annulus. There was mild regurgitation. - Left atrium: The atrium was moderately dilated. - Right atrium: The atrium was mildly dilated. - Atrial septum: There was a patent foramen ovale.  Impressions:  - Mild global reduction in LV function; restrictive filling with  elevated filling pressure; mild LVH; biatrial enlargement;  heavily calcified aortic valve with probable mild to moderate AS  (mean gradient 16 mmHg; calculated AVA .9  cm2 likely  overestimates severity; dimensionless index .3 not supportive of  severe AS); mild MR; patent foramen ovale.  07/2015 Cath  Prox LAD to Mid LAD lesion, 50% stenosed.  1. Calcified moderate proximal-mid LAD stenosis 2. Widely patent, dominant LCx 3. Preserved cardiac output, mildly elevated right-sided intracardiac pressures  Recommend: Medical therapy for nonobstructive CAD and CHF  01/2016 AAA [place] Screen No aneurysm    Assessment and Plan   1. CAD - moderate nonobstructive disease bypriorcath. - denies any recent symptoms, continue current meds   2. Hyperlipidemia -continue statin, reasonable LDL. If peristently above 70 may consider changing to crestor in the future.    4. Aortic stenosis - mild to moderate by most recent echo -repeat echo for continued surveillance.   5. HTN -at goal, continue current meds  7. Chronic diasotlic heart failure -  no symptoms and currently euvolemic, continue to monitor  8. Preoperative evaluation - tolerates greater than 4METs regularly without limitation - follow up his surveillance echo for his history of aortic stenosis, if stable findings would plan to proceed with surgery if still needed  F/u 6 months   F/u 6 months. Request pcp labs      Arnoldo Lenis, M.D., F.A.C.C.

## 2018-12-12 ENCOUNTER — Other Ambulatory Visit: Payer: Self-pay

## 2018-12-12 MED ORDER — METOPROLOL SUCCINATE ER 25 MG PO TB24: 25.0000 mg | ORAL_TABLET | Freq: Every day | ORAL | 3 refills | Status: DC

## 2018-12-12 NOTE — Telephone Encounter (Signed)
Refilled metoprolol 

## 2018-12-14 ENCOUNTER — Telehealth (INDEPENDENT_AMBULATORY_CARE_PROVIDER_SITE_OTHER): Payer: Medicare HMO | Admitting: Cardiology

## 2018-12-14 ENCOUNTER — Telehealth: Payer: Self-pay | Admitting: Family Medicine

## 2018-12-14 ENCOUNTER — Encounter: Payer: Self-pay | Admitting: Cardiology

## 2018-12-14 VITALS — BP 148/72 | Ht 67.0 in | Wt 205.0 lb

## 2018-12-14 DIAGNOSIS — I2510 Atherosclerotic heart disease of native coronary artery without angina pectoris: Secondary | ICD-10-CM | POA: Diagnosis not present

## 2018-12-14 DIAGNOSIS — I350 Nonrheumatic aortic (valve) stenosis: Secondary | ICD-10-CM

## 2018-12-14 DIAGNOSIS — I10 Essential (primary) hypertension: Secondary | ICD-10-CM

## 2018-12-14 DIAGNOSIS — I5032 Chronic diastolic (congestive) heart failure: Secondary | ICD-10-CM

## 2018-12-14 DIAGNOSIS — E782 Mixed hyperlipidemia: Secondary | ICD-10-CM

## 2018-12-14 MED ORDER — LISINOPRIL 10 MG PO TABS: 10.0000 mg | ORAL_TABLET | Freq: Every day | ORAL | 3 refills | Status: DC

## 2018-12-14 NOTE — Progress Notes (Signed)
Virtual Visit via Telephone Note   This visit type was conducted due to national recommendations for restrictions regarding the COVID-19 Pandemic (e.g. social distancing) in an effort to limit this patient's exposure and mitigate transmission in our community.  Due to his co-morbid illnesses, this patient is at least at moderate risk for complications without adequate follow up.  This format is felt to be most appropriate for this patient at this time.  The patient did not have access to video technology/had technical difficulties with video requiring transitioning to audio format only (telephone).  All issues noted in this document were discussed and addressed.  No physical exam could be performed with this format.  Please refer to the patient's chart for his  consent to telehealth for [facility].   Date:  12/14/2018   ID:  BRITTANI PENISTON, DOB Jan 25, 1951, MRN [id number]  Patient Location: Home Provider Location: Office  PCP:  Maryruth Hancock, MD  Cardiologist:  Carlyle Dolly, MD  Electrophysiologist:  None   Evaluation Performed:  Follow-Up Visit  Chief Complaint:  Follow up visit  History of Present Illness:    Wynter D Verdugo is a 67 y.o. male seen today for follow up of the following medical problems.   1. CAD - CT scan chest 02/2013 with 3 vessel CAD, severe LAD disease. - Jan 2015 nuclear stress without clear ischemia - admit 07/2015 with acute pulmonary edema in setting of severe HTN. Referred for cath.  - cath 07/2015 with 50% LAD disease, overall patent coronaries - his presentation with pulmonary edema likely secondary to severe HTN in setting of restrictive diastolic dysfunction. - echo 07/2015 LVEF 45-50%, restrictive diastolic dysfunction    -no recent chest pain. No SOB/DOE - compliant with meds   2. Severe OSA - not using CPAP machine due to discomfort. Stopped several years ago - last visit referred to Dr Luan Pulling.  3. Hyperlipidemia -11/2018 TC 82 HDL  25 TG 137 LDL 35 - compliant with atorvastatin  4. Aortic stenosis - echo 07/2015 mild to moderate. Mean gradient 16, AVA reported at 0.9 with dimensionless index 0.3 - denies any recent symptoms  Jan 2020 echo LVEF 60-65%, mild to moderate stenosis - no recent symptoms  5. HTN - he is compliant with meds  - from other visits SBP 140s.    6. Chronic diastolic HF - echo [id number] with restrictive diastolic dysfunction. Low normal to mildly decreased LVEF at 45-50%.  -no recent symptoms, no edema  - no recent edema, sob or doe  7. DM2 - followed by pcp   8. 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    Past Medical History:  Diagnosis Date  . Abnormal cardiovascular stress test 03/2013  . Anxiety   . Arthritis   . Back pain   . CHF (congestive heart failure) ([place])   . Coronary artery disease    Multivessel with significant LAD involvement by chest CT 2014  . Depression   . Diastolic dysfunction   . Essential hypertension   . Lumbar herniated disc    L4-L5  . MRSA (methicillin resistant staph aureus) culture positive   . Shortness of breath dyspnea   . Sleep apnea    No  cpap  . Type 2 diabetes mellitus ([place])    Past Surgical History:  Procedure Laterality Date  . ANTERIOR CERVICAL DECOMP/DISCECTOMY FUSION  01/18/2011   Procedure: ANTERIOR CERVICAL DECOMPRESSION/DISCECTOMY FUSION 2 LEVELS;  Surgeon: Cooper Render Pool;  Location: [place] [facility] ORS;  Service: Neurosurgery;  Laterality: N/A;  anterior cervical discectomy and fusion with allograft and plating, cervical five-six, cervical six-seven  . BIOPSY N/A 04/04/2015   Procedure: BIOPSY;  Surgeon: Rogene Houston, MD;  Location: [facility];   Service: Endoscopy;  Laterality: N/A;  . Bone spur removed  1997   Left shoulder  . CARDIAC CATHETERIZATION N/A 08/08/2015   Procedure: Right/Left Heart Cath and Coronary Angiography;  Surgeon: Sherren Mocha, MD;  Location: [place] CV LAB;  Service: Cardiovascular;  Laterality: N/A;  . CARPAL TUNNEL RELEASE Right   . ESOPHAGEAL DILATION N/A 04/04/2015   Procedure: ESOPHAGEAL DILATION;  Surgeon: Rogene Houston, MD;  Location: [facility];  Service: Endoscopy;  Laterality: N/A;  . ESOPHAGOGASTRODUODENOSCOPY N/A 04/04/2015   Procedure: ESOPHAGOGASTRODUODENOSCOPY (EGD);  Surgeon: Rogene Houston, MD;  Location: [facility];  Service: Endoscopy;  Laterality: N/A;  1240  . HEMORRHOID SURGERY    . TONSILLECTOMY       No outpatient medications have been marked as taking for the 12/14/18 encounter (Appointment) with Arnoldo Lenis, MD.     Allergies:   Morphine and related   Social History   Tobacco Use  . Smoking status: Current Every Day Smoker    Packs/day: 1.00    Years: 50.00    Pack years: 50.00    Types: Cigarettes  . Smokeless tobacco: Former Systems developer  . Tobacco comment: smoking x 50 yrs  Substance Use Topics  . Alcohol use: Yes    Alcohol/week: 1.0 standard drinks    Types: 1 Standard drinks or equivalent per week    Comment: rare  . Drug use: Yes    Types: Marijuana    Comment: as needed     Family Hx: The patient's family history includes Cushing syndrome in his sister; Diabetes in his father and sister; Hepatitis B in his father; Lung cancer in his mother.  ROS:   Please see the history of present illness.     All other systems reviewed and are negative.   Prior CV studies:   The following studies were reviewed today:  Jan 2015 MPI IMPRESSION: 1. Abnormal exercise myocardial perfusion imaging stress test  2. Inferior wall defect likely due to subdiaphragmatic attenuation, its wall motion is similar to other areas without a perfusion defect.  3.  Likely small anteroseptal infract, this area is more hypokinetic than the rest of the myocardium  3. Low left ventricular systolic function with global hypokinesis  4. Mildly reduced functional capacity (90% of age and gender predicted)  5. Overall increased risk for major cardiac events based on low ejection fraction. There is no current myocardium at jeopardy.   02/2013 echo Study Conclusions  - Left ventricle: The cavity size was normal. There was mild concentric hypertrophy. Systolic function was normal. The estimated ejection fraction was in the range of 55% to 60%. Wall motion was normal; there were no regional wall motion abnormalities. There is mildly asynchronous contraction consistent with intraventricular conduction delay (IVCD) or bundle Stephenia Vogan block. Doppler parameters are consistent with abnormal left ventricular relaxation (grade 1 diastolic dysfunction). - Aortic valve: Cusp separation was mildly reduced. There was mild to moderate stenosis. - Left atrium: The atrium was moderately  dilated.  07/2015 echo  Study Conclusions  - Left ventricle: The cavity size was normal. Wall thickness was  increased in a pattern of mild LVH. Systolic function was mildly  reduced. The estimated ejection fraction was in the range of 45%  to 50%. Diffuse hypokinesis. Doppler parameters are consistent  with restrictive physiology, indicative of decreased left  ventricular diastolic compliance and/or increased left atrial  pressure. Doppler parameters are consistent with high ventricular  filling pressure. - Aortic valve: Valve mobility was restricted. There was mild to  moderate stenosis. Valve area (VTI): 0.96 cm^2. Valve area  (Vmax): 0.91 cm^2. Valve area (Vmean): 0.89 cm^2. - Mitral valve: Calcified annulus. There was mild regurgitation. - Left atrium: The atrium was moderately dilated. - Right atrium: The atrium was mildly dilated. -  Atrial septum: There was a patent foramen ovale.  Impressions:  - Mild global reduction in LV function; restrictive filling with  elevated filling pressure; mild LVH; biatrial enlargement;  heavily calcified aortic valve with probable mild to moderate AS  (mean gradient 16 mmHg; calculated AVA .9 cm2 likely  overestimates severity; dimensionless index .3 not supportive of  severe AS); mild MR; patent foramen ovale.  07/2015 Cath  Prox LAD to Mid LAD lesion, 50% stenosed.  1. Calcified moderate proximal-mid LAD stenosis 2. Widely patent, dominant LCx 3. Preserved cardiac output, mildly elevated right-sided intracardiac pressures  Recommend: Medical therapy for nonobstructive CAD and CHF  01/2016 AAA [place] Screen No aneurysm  Labs/Other Tests and Data Reviewed:    EKG:  No ECG reviewed.  Recent Labs: 11/14/2018: ALT 16; BUN 10; Creat 0.72; Hemoglobin 15.9; Platelets 227; Potassium 4.1; Sodium 140; TSH 1.96   Recent Lipid Panel Lab Results  Component Value Date/Time   CHOL 82 11/14/2018 10:38 AM   TRIG 137 11/14/2018 10:38 AM   HDL 25 (L) 11/14/2018 10:38 AM   CHOLHDL 3.3 11/14/2018 10:38 AM   LDLCALC 35 11/14/2018 10:38 AM    Wt Readings from Last 3 Encounters:  11/29/18 204 lb 12.8 oz (92.9 kg)  11/13/18 206 lb (93.4 kg)  10/25/18 206 lb 12.8 oz (93.8 kg)     Objective:    Vital Signs:   Today's Vitals   12/14/18 1328  BP: (!) 148/72  Weight: 205 lb (93 kg)  Height: 5\' 7"  (1.702 m)   Body mass index is 32.11 kg/m.  Normal affect. Normal speech pattern and tone. Comfortable, no apparent distress. No audible signs of SOB or wheezing.   ASSESSMENT & PLAN:    1. CAD - moderate nonobstructive disease bypriorcath. - no symptoms, continue current meds   2. Hyperlipidemia - at goal, continue statin   3. Aortic stenosis - mild to moderate by most recent echo -continue to monitor at this time  4. HTN -above goal, increase lisinopril to  10mg  daily.   5. Chronic diasotlic heart failure - no recent symptoms continue current meds    F/u 6 months  COVID-19 Education: The signs and symptoms of COVID-19 were discussed with the patient and how to seek care for testing (follow up with PCP or arrange E-visit). The importance of social distancing was discussed today.  Time:   Today, I have spent 20 minutes with the patient with telehealth technology discussing the above problems.     Medication Adjustments/Labs and Tests Ordered: Current medicines are reviewed at length with the patient today.  Concerns regarding medicines are outlined above.   Tests Ordered: No orders of the defined types were placed  in this encounter.   Medication Changes: No orders of the defined types were placed in this encounter.   Follow Up:  In Person in 6 month(s)  Signed, Carlyle Dolly, MD  12/14/2018 12:22 PM    [facility]

## 2018-12-14 NOTE — Progress Notes (Signed)
Medication Instructions:  INCREASE LISINOPRIL TO 10 MG DAILY   Labwork: NONE  Testing/Procedures: NONE  Follow-Up: Your physician wants you to follow-up in: 6 MONTHS. You will receive a reminder letter in the mail two months in advance. If you don't receive a letter, please call our office to schedule the follow-up appointment.   Any Other Special Instructions Will Be Listed Below (If Applicable).     If you need a refill on your cardiac medications before your next appointment, please call your pharmacy.

## 2018-12-14 NOTE — Telephone Encounter (Signed)
I returned the call but I had to leave voicemail

## 2018-12-14 NOTE — Addendum Note (Signed)
Addended by: Debbora Lacrosse R on: 12/14/2018 02:57 PM   Modules accepted: Orders

## 2018-12-14 NOTE — Telephone Encounter (Signed)
[facility] in regards to a prior authorization for this patient. Please contact at [phone] ext [phone]

## 2018-12-15 ENCOUNTER — Ambulatory Visit (HOSPITAL_COMMUNITY): Admission: RE | Admit: 2018-12-15 | Payer: Medicare HMO | Source: Ambulatory Visit

## 2018-12-15 ENCOUNTER — Encounter (HOSPITAL_COMMUNITY): Payer: Self-pay

## 2018-12-18 ENCOUNTER — Telehealth: Payer: Self-pay | Admitting: "Endocrinology

## 2018-12-18 MED ORDER — METFORMIN HCL 500 MG PO TABS: 500.0000 mg | ORAL_TABLET | Freq: Two times a day (BID) | ORAL | 0 refills | Status: DC

## 2018-12-18 NOTE — Telephone Encounter (Signed)
Rx sent 

## 2018-12-18 NOTE — Telephone Encounter (Signed)
Patient needs a new script sent to [facility] for his Metformin

## 2018-12-20 IMAGING — DX DG SACRUM/COCCYX 2+V
3 series · 3 of 3 positions shown · non-contrast
Comparison: 04/29/2005

CLINICAL DATA: Recent fall with buttock pain, initial encounter

EXAM:
SACRUM AND COCCYX - 2+ VIEW

[coccyx ap]
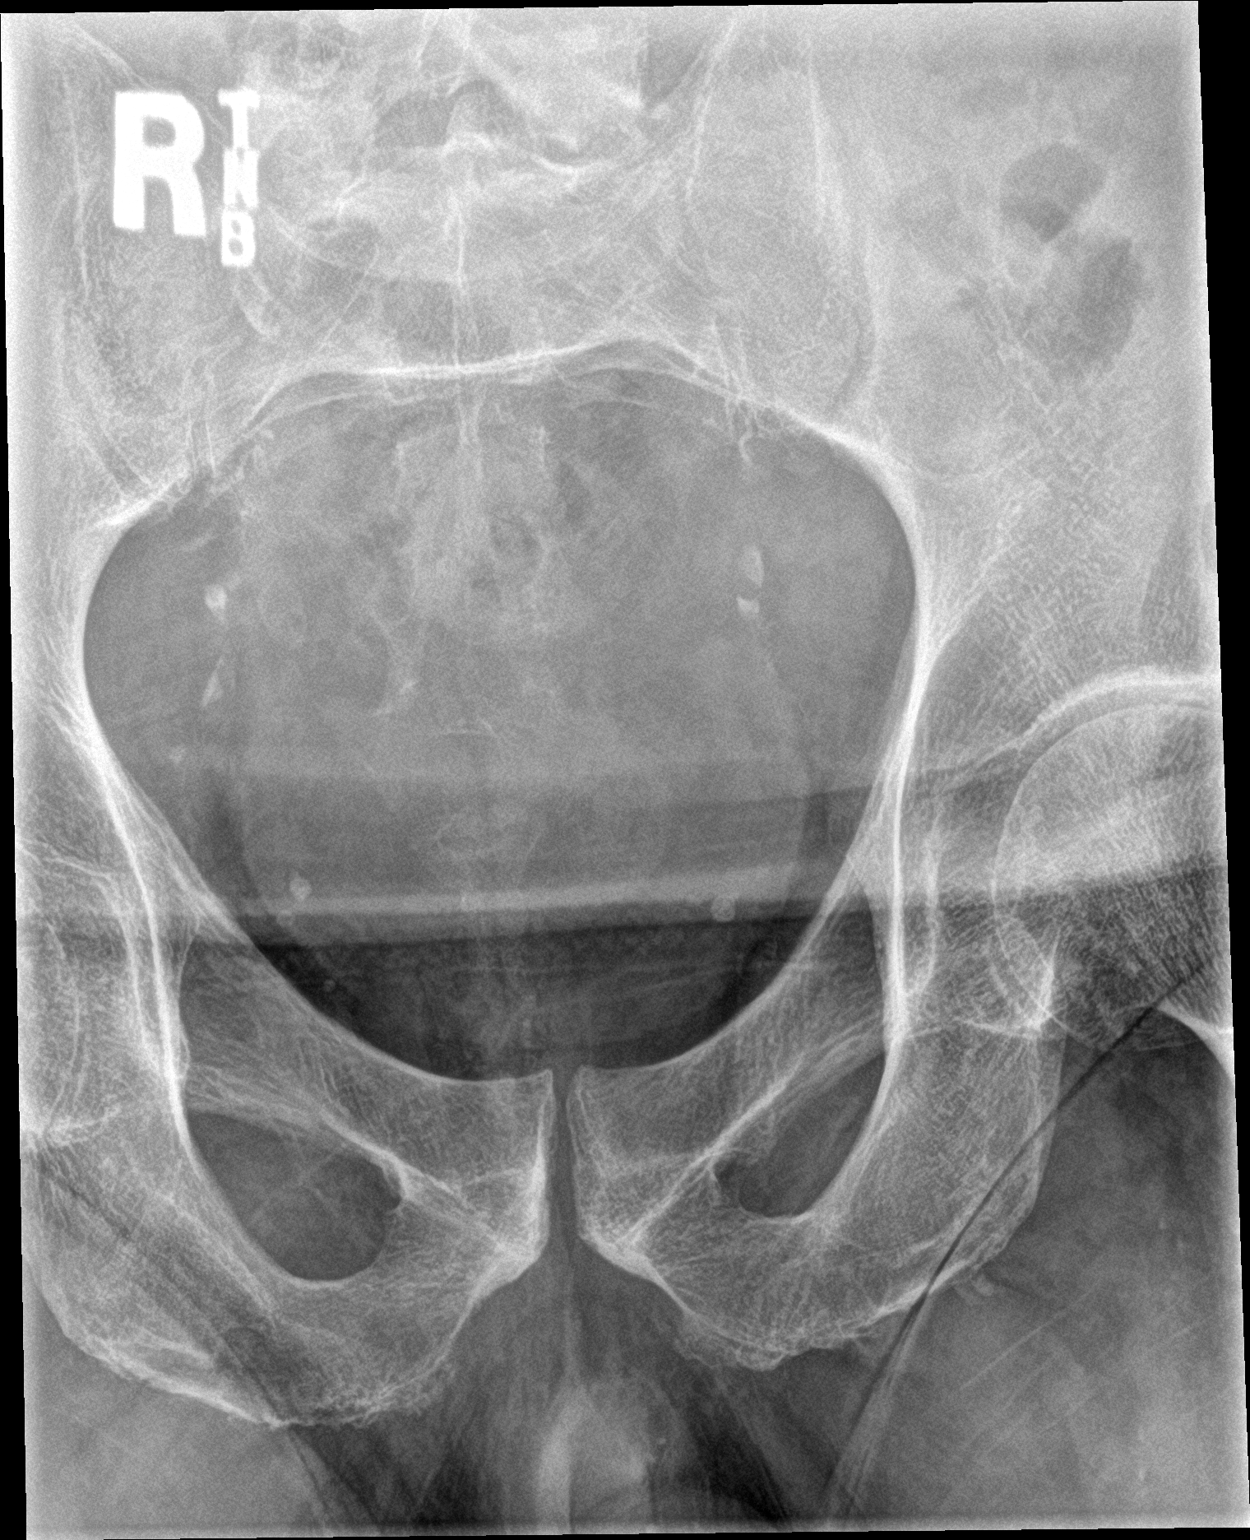

[sacrum ap]
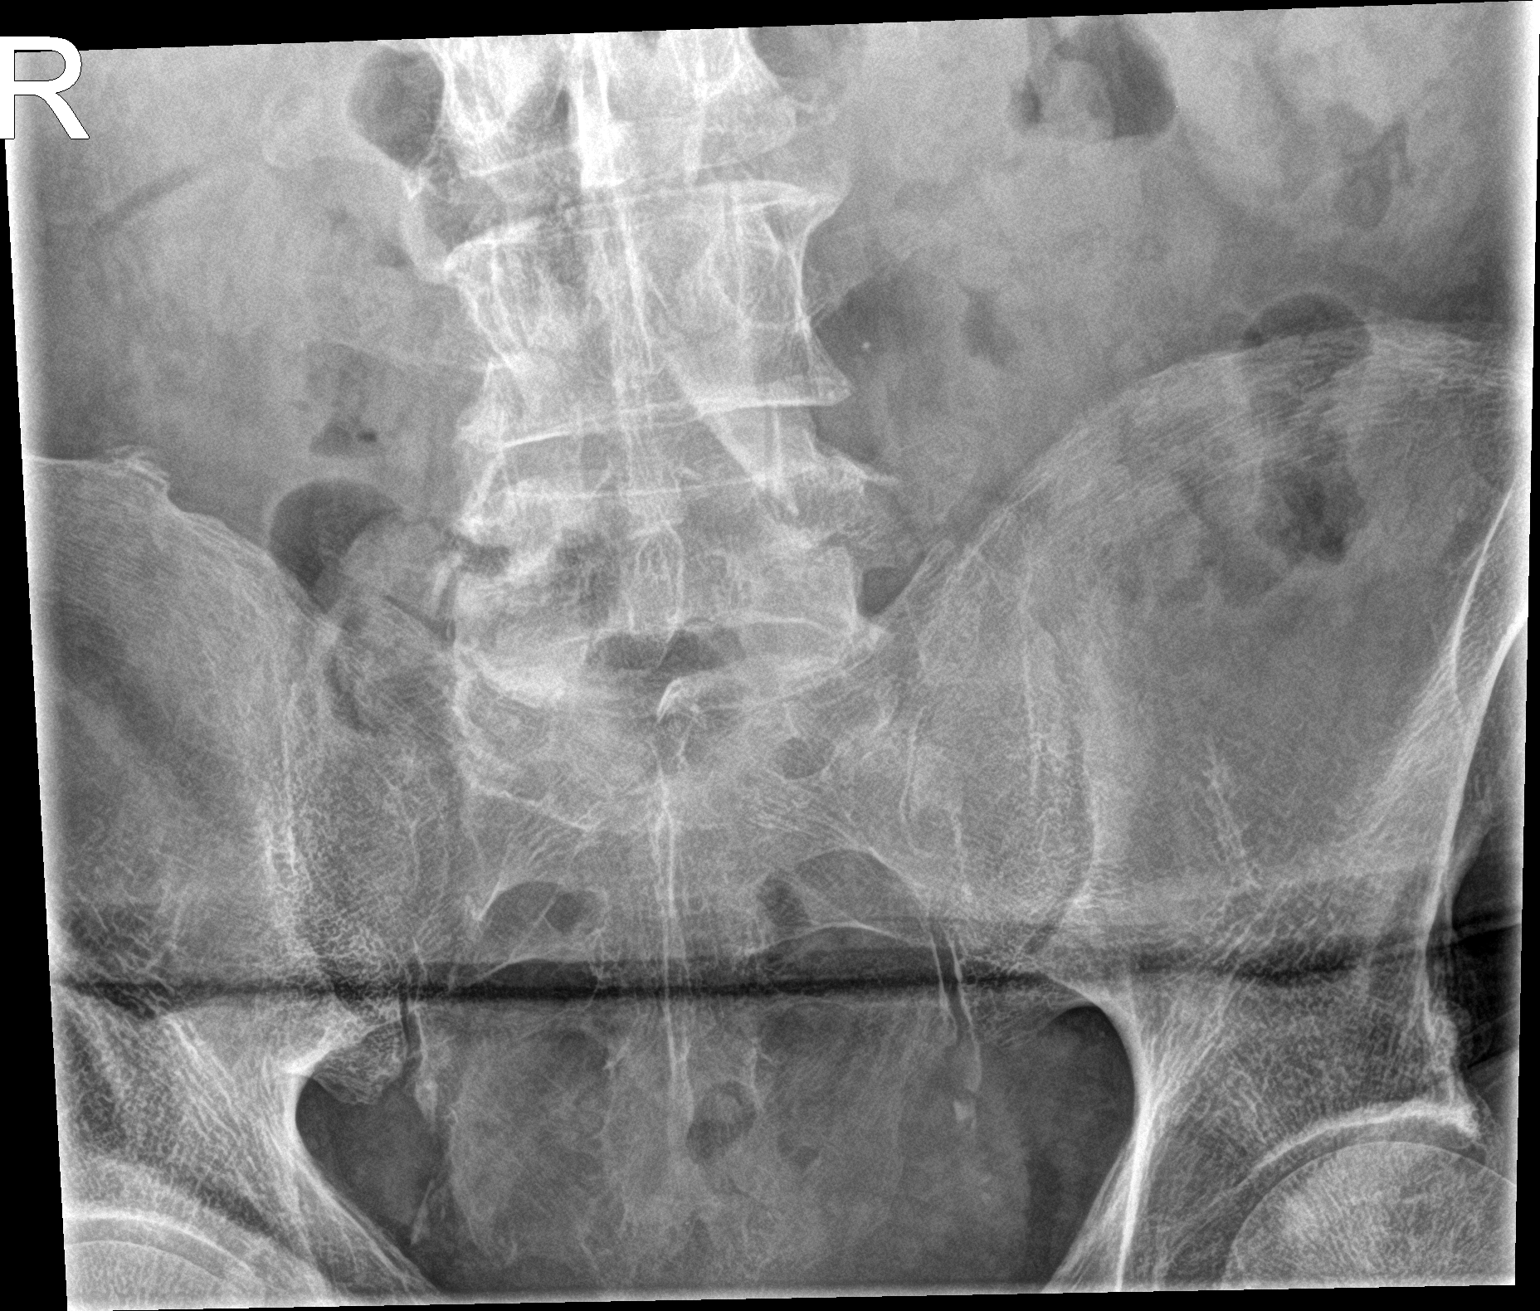

[sacrum lat]
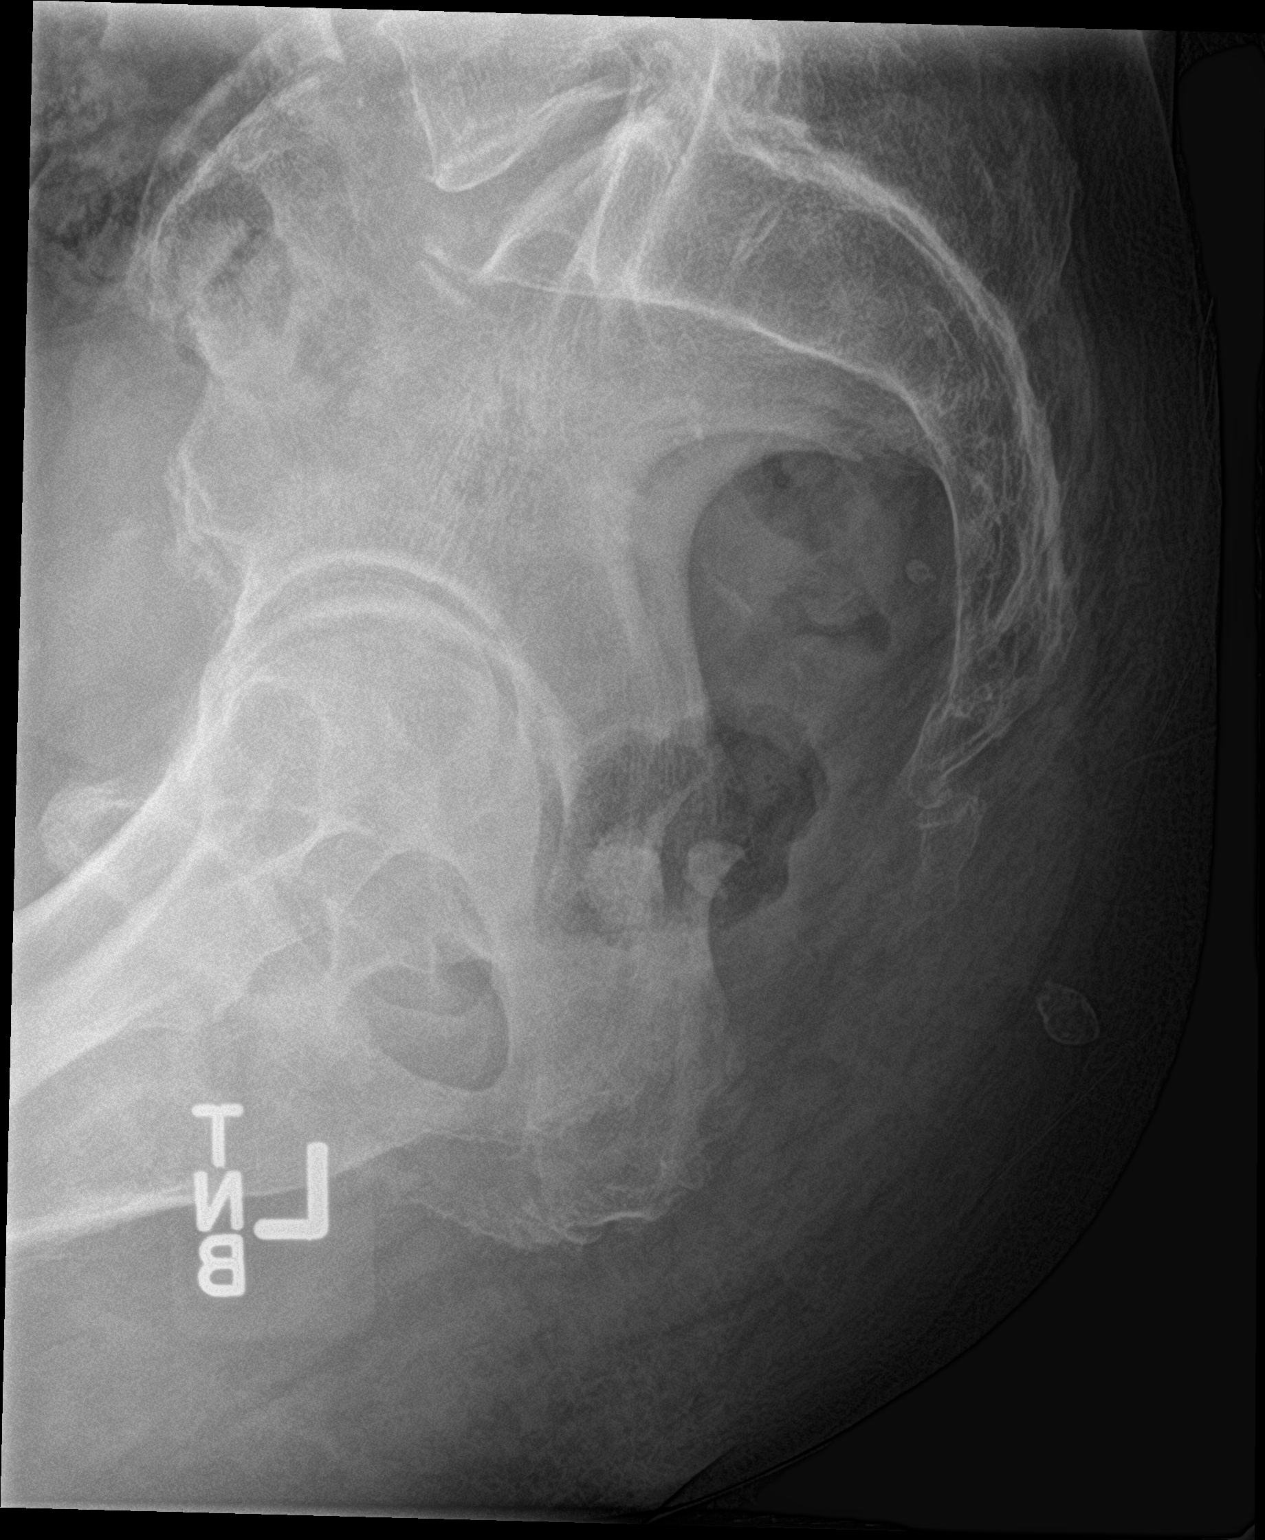

[3 of 3 positions shown; findings below may reference images not displayed]

FINDINGS: Pelvic ring is intact. The sacral ala are within normal limits. No
acute fracture is noted. Posterior displacement of the coccyx with
respect to the sacrum is seen although chronic when compare with the
prior exam..
IMPRESSION: No acute abnormality noted

## 2018-12-26 ENCOUNTER — Encounter (INDEPENDENT_AMBULATORY_CARE_PROVIDER_SITE_OTHER): Payer: Self-pay | Admitting: *Deleted

## 2019-01-07 DIAGNOSIS — R69 Illness, unspecified: Secondary | ICD-10-CM | POA: Diagnosis not present

## 2019-01-09 ENCOUNTER — Telehealth: Payer: Self-pay | Admitting: Family Medicine

## 2019-01-10 ENCOUNTER — Ambulatory Visit (HOSPITAL_COMMUNITY): Payer: Medicare HMO

## 2019-01-30 ENCOUNTER — Ambulatory Visit (INDEPENDENT_AMBULATORY_CARE_PROVIDER_SITE_OTHER): Payer: Medicare HMO | Admitting: Urology

## 2019-01-30 DIAGNOSIS — N5202 Corporo-venous occlusive erectile dysfunction: Secondary | ICD-10-CM | POA: Diagnosis not present

## 2019-01-30 DIAGNOSIS — E349 Endocrine disorder, unspecified: Secondary | ICD-10-CM

## 2019-02-22 ENCOUNTER — Encounter: Payer: Self-pay | Admitting: "Endocrinology

## 2019-02-22 ENCOUNTER — Other Ambulatory Visit: Payer: Self-pay

## 2019-02-22 ENCOUNTER — Ambulatory Visit: Payer: Medicare HMO | Admitting: "Endocrinology

## 2019-02-22 VITALS — BP 136/76 | HR 78 | Ht 67.0 in | Wt 196.0 lb

## 2019-02-22 DIAGNOSIS — E782 Mixed hyperlipidemia: Secondary | ICD-10-CM

## 2019-02-22 DIAGNOSIS — I10 Essential (primary) hypertension: Secondary | ICD-10-CM | POA: Diagnosis not present

## 2019-02-22 DIAGNOSIS — E291 Testicular hypofunction: Secondary | ICD-10-CM | POA: Diagnosis not present

## 2019-02-22 DIAGNOSIS — E1159 Type 2 diabetes mellitus with other circulatory complications: Secondary | ICD-10-CM

## 2019-02-22 LAB — HEMOGLOBIN A1C: Hemoglobin A1C: 9.1

## 2019-02-22 NOTE — Patient Instructions (Signed)

## 2019-02-22 NOTE — Progress Notes (Signed)
02/22/2019, 12:49 PM                                                    Endocrinology Telehealth Visit Follow up Note -During COVID -19 Pandemic  This visit type was conducted due to national recommendations for restrictions regarding the COVID-19 Pandemic  in an effort to limit this patient's exposure and mitigate transmission of the corona virus.  Due to his co-morbid illnesses, Jonathan Gilmore is at  moderate to high risk for complications without adequate follow up.  This format is felt to be most appropriate for him at this time.  I connected with this patient on 02/22/2019   by telephone and verified that I am speaking with the correct person using two identifiers. Jonathan Gilmore, 1950-08-23. he has verbally consented to this visit. All issues noted in this document were discussed and addressed. The format was not optimal for physical exam.    Subjective:    Patient ID: Jonathan Gilmore, male    DOB: 06-Jul-1950.  Jonathan Gilmore is being engaged in telehealth via telephone in follow-up for management of currently uncontrolled symptomatic diabetes requested by  Maryruth Hancock, MD.   Past Medical History:  Diagnosis Date  . Abnormal cardiovascular stress test 03/2013  . Anxiety   . Arthritis   . Back pain   . CHF (congestive heart failure) ([place])   . Coronary artery disease    Multivessel with significant LAD involvement by chest CT 2014  . Depression   . Diastolic dysfunction   . Essential hypertension   . Lumbar herniated disc    L4-L5  . MRSA (methicillin resistant staph aureus) culture positive   . Shortness of breath dyspnea   . Sleep apnea    No cpap  . Type 2 diabetes mellitus ([place])     Past Surgical History:  Procedure Laterality Date  . ANTERIOR CERVICAL DECOMP/DISCECTOMY FUSION  01/18/2011   Procedure: ANTERIOR CERVICAL DECOMPRESSION/DISCECTOMY FUSION 2 LEVELS;  Surgeon: Cooper Render Pool;   Location: [facility];  Service: Neurosurgery;  Laterality: N/A;  anterior cervical discectomy and fusion with allograft and plating, cervical five-six, cervical six-seven  . BIOPSY N/A 04/04/2015   Procedure: BIOPSY;  Surgeon: Rogene Houston, MD;  [facility]: [facility];  Service: Endoscopy;  Laterality: N/A;  . Bone spur removed  1997   Left shoulder  . CARDIAC CATHETERIZATION N/A 08/08/2015   Procedure: Right/Left Heart Cath and Coronary Angiography;  Surgeon: Sherren Mocha, MD;  Location: [facility];  Service: Cardiovascular;  Laterality: N/A;  . CARPAL TUNNEL RELEASE Right   . ESOPHAGEAL DILATION N/A 04/04/2015   Procedure: ESOPHAGEAL DILATION;  Surgeon: Rogene Houston, MD;  [facility]: [facility];  Service: Endoscopy;  Laterality: N/A;  . ESOPHAGOGASTRODUODENOSCOPY N/A 04/04/2015   Procedure: ESOPHAGOGASTRODUODENOSCOPY (EGD);  Surgeon: Rogene Houston, MD;  Location: [facility];  Service: Endoscopy;  Laterality: N/A;  1240  . HEMORRHOID SURGERY    .  TONSILLECTOMY      Social History   Socioeconomic History  . Marital status: Married    Spouse name: Not on file  . Number of children: Not on file  . Years of education: Not on file  . Highest education level: Not on file  Occupational History  . Not on file  Tobacco Use  . Smoking status: Current Every Day Smoker    Packs/day: 1.00    Years: 50.00    Pack years: 50.00    Types: Cigarettes  . Smokeless tobacco: Former Systems developer  . Tobacco comment: smoking x 50 yrs  Substance and Sexual Activity  . Alcohol use: Yes    Alcohol/week: 1.0 standard drinks    Types: 1 Standard drinks or equivalent per week    Comment: rare  . Drug use: Yes    Types: Marijuana    Comment: as needed  . Sexual activity: Yes  Other Topics Concern  . Not on file  Social History Narrative  . Not on file   Social Determinants of Health   Financial Resource Strain:   . Difficulty of Paying Living Expenses: Not on file  Food Insecurity:    . Worried About Charity fundraiser in the Last Year: Not on file  . Ran Out of Food in the Last Year: Not on file  Transportation Needs:   . Lack of Transportation (Medical): Not on file  . Lack of Transportation (Non-Medical): Not on file  Physical Activity:   . Days of Exercise per Week: Not on file  . Minutes of Exercise per Session: Not on file  Stress:   . Feeling of Stress : Not on file  Social Connections:   . Frequency of Communication with Friends and Family: Not on file  . Frequency of Social Gatherings with Friends and Family: Not on file  . Attends Religious Services: Not on file  . Active Member of Clubs or Organizations: Not on file  . Attends Archivist Meetings: Not on file  . Marital Status: Not on file    Family History  Problem Relation Age of Onset  . Lung cancer Mother   . Diabetes Father   . Hepatitis B Father   . Diabetes Sister   . Cushing syndrome Sister     Outpatient Encounter Medications as of 02/22/2019  Medication Sig  . ACCU-CHEK AVIVA PLUS test strip USE AS INSTRUCTED  . aspirin EC 81 MG tablet Take 81 mg by mouth daily.  Marland Kitchen atorvastatin (LIPITOR) 80 MG tablet TAKE 1 TABLET BY MOUTH EVERYDAY AT BEDTIME  . CALCIUM-MAGNESIUM-ZINC PO Take 1 tablet by mouth daily.  . Flaxseed, Linseed, (FLAXSEED OIL PO) Take 1 tablet by mouth daily.   . furosemide (LASIX) 40 MG tablet TAKE 1 TABLET BY MOUTH EVERY DAY  . gabapentin (NEURONTIN) 300 MG capsule Take 1 capsule (300 mg total) by mouth at bedtime. (Patient taking differently: Take 600 mg by mouth at bedtime. )  . ibuprofen (ADVIL,MOTRIN) 200 MG tablet Take 800 mg by mouth daily as needed for moderate pain.  . Insulin Glargine (LANTUS SOLOSTAR) 100 UNIT/ML Solostar Pen Inject 60 Units into the skin at bedtime.  . insulin lispro (HUMALOG) 100 UNIT/ML KwikPen Inject 1 Units into the skin 3 (three) times daily before meals.  Marland Kitchen lisinopril (ZESTRIL) 10 MG tablet Take 1 tablet (10 mg total) by mouth  daily.  . metFORMIN (GLUCOPHAGE) 500 MG tablet Take 1 tablet (500 mg total) by mouth 2 (two) times daily.  Marland Kitchen  metoprolol succinate (TOPROL-XL) 25 MG 24 hr tablet Take 1 tablet (25 mg total) by mouth at bedtime.  . Multiple Vitamin (MULTIVITAMIN WITH MINERALS) TABS Take 1 tablet by mouth daily.  . nitroGLYCERIN (NITROSTAT) 0.4 MG SL tablet Place 1 tablet (0.4 mg total) under the tongue every 5 (five) minutes as needed for chest pain.  Marland Kitchen testosterone cypionate (DEPOTESTOSTERONE CYPIONATE) 200 MG/ML injection Inject 200 mg into the muscle every 14 (fourteen) days.    No facility-administered encounter medications on file as of 02/22/2019.    ALLERGIES: Allergies  Allergen Reactions  . Morphine And Related Nausea And Vomiting    VACCINATION STATUS: Immunization History  Administered Date(s) Administered  . Fluad Quad(high Dose 65+) 11/29/2018  . Influenza Split 01/19/2011  . Influenza,inj,Quad PF,6+ Mos 02/25/2013  . Pneumococcal Conjugate-13 11/29/2018  . Pneumococcal Polysaccharide-23 02/25/2013    Diabetes He presents for his follow-up diabetic visit. He has type 2 diabetes mellitus. His disease course has been worsening. There are no hypoglycemic associated symptoms. Pertinent negatives for hypoglycemia include no confusion, headaches, pallor or seizures. Associated symptoms include polydipsia and polyuria. Pertinent negatives for diabetes include no chest pain, no fatigue, no polyphagia and no weakness. There are no hypoglycemic complications. Symptoms are worsening. Diabetic complications include heart disease. Risk factors for coronary artery disease include diabetes mellitus, male sex, tobacco exposure, sedentary lifestyle and obesity. Current diabetic treatment includes insulin injections (He made errors in his insulin administration.  He was supposed to be on Lantus 26 units nightly, however end up doing Lantus 20 units 4 times a day.). His weight is decreasing steadily. He is  following a generally unhealthy diet. When asked about meal planning, he reported none. He has not had a previous visit with a dietitian. He never participates in exercise. His home blood glucose trend is increasing steadily. (He did not monitor blood glucose regularly of late, his A1c today was 9.1% increasing from 8.8%.  ) An ACE inhibitor/angiotensin II receptor blocker is being taken. Eye exam is current.  Hypertension This is a chronic problem. The current episode started more than 1 year ago. The problem is uncontrolled. Pertinent negatives include no chest pain, headaches, neck pain, palpitations or shortness of breath. Risk factors for coronary artery disease include diabetes mellitus, male gender, obesity, sedentary lifestyle and smoking/tobacco exposure. Past treatments include ACE inhibitors.     Objective:    BP 136/76   Pulse 78   Ht 5\' 7"  (1.702 m)   Wt 196 lb (88.9 kg)   BMI 30.70 kg/m   Wt Readings from Last 3 Encounters:  02/22/19 196 lb (88.9 kg)  12/14/18 205 lb (93 kg)  11/29/18 204 lb 12.8 oz (92.9 kg)      CMP ( most recent) CMP     Component Value Date/Time   NA 140 11/14/2018 1038   K 4.1 11/14/2018 1038   CL 106 11/14/2018 1038   CO2 28 11/14/2018 1038   GLUCOSE 177 (H) 11/14/2018 1038   BUN 10 11/14/2018 1038   CREATININE 0.72 11/14/2018 1038   CALCIUM 8.9 11/14/2018 1038   PROT 6.4 11/14/2018 1038   ALBUMIN 3.7 08/08/2015 0335   AST 17 11/14/2018 1038   ALT 16 11/14/2018 1038   ALKPHOS 74 08/08/2015 0335   BILITOT 0.5 11/14/2018 1038   GFRNONAA 97 11/14/2018 1038   GFRAA 112 11/14/2018 1038     Diabetic Labs (most recent): Lab Results  Component Value Date   HGBA1C 9.1 02/22/2019   HGBA1C 8.8 (  A) 11/13/2018   HGBA1C 9.5 (H) 01/16/2018     Lipid Panel ( most recent) Lipid Panel     Component Value Date/Time   CHOL 82 11/14/2018 1038   TRIG 137 11/14/2018 1038   HDL 25 (L) 11/14/2018 1038   CHOLHDL 3.3 11/14/2018 1038   VLDL 39  08/07/2015 0509   LDLCALC 35 11/14/2018 1038      Lab Results  Component Value Date   TSH 1.96 11/14/2018   TSH 0.421 09/19/2013      Assessment & Plan:   1. DM type 2 causing vascular disease ([place])  - Jonathan Gilmore has currently uncontrolled symptomatic type 2 DM since 68 years of age. -Despite his error taking more than prescribed Lantus his point-of-care A1c is higher today at 9.1% increasing from 8.8%.  He was still running significantly above target glycemic profile until he stopped testing several days ago.  -his diabetes is complicated by coronary artery disease,  heavy smoking, and he remains at a high risk for more acute and chronic complications which include CAD, CVA, CKD, retinopathy, and neuropathy. These are all discussed in detail with him.  - I have counseled him on diet management and weight loss, by adopting a carbohydrate restricted/protein rich diet.  - he  admits there is a room for improvement in his diet and drink choices. -  Suggestion is made for him to avoid simple carbohydrates  from his diet including Cakes, Sweet Desserts / Pastries, Ice Cream, Soda (diet and regular), Sweet Tea, Candies, Chips, Cookies, Sweet Pastries,  Store Bought Juices, Alcohol in Excess of  1-2 drinks a day, Artificial Sweeteners, Coffee Creamer, and "Sugar-free" Products. This will help patient to have stable blood glucose profile and potentially avoid unintended weight gain.   - I encouraged him to switch to  unprocessed or minimally processed complex starch and increased protein intake (animal or plant source), fruits, and vegetables.  - he is advised to stick to a routine mealtimes to eat 3 meals  a day and avoid unnecessary snacks ( to snack only to correct hypoglycemia).   - he will be scheduled with Jonathan Gilmore, RDN, CDE for diabetes education.  - I have approached him with the following individualized plan to manage  his diabetes and patient agrees:   -I had a long  discussion about safe use of insulin and proper administration.  He is also approached to start strict monitoring of blood glucose for safe use of insulin. -He is advised to lower his Lantus to 60 units nightly, associated with monitoring of blood glucose 4 times a day-before meals and at bedtime. -He is trying to get clearance for back surgery .  For this to happen, he has to achieve average blood glucose below 180 mg per DL.  He will return with his meter and logs in 3 weeks for reevaluation.  - He is advised to stay off of Humalog for now. - he is encouraged to call clinic for blood glucose levels less than 70 or above 200 mg /dl. - he is advised to continue metformin 500 mg p.o. twice daily, therapeutically suitable for patient. He is not a candidate for SGLT2 inhibitors nor incretin therapy given his chronic heavy smoking.   - Patient specific target  A1c;  LDL, HDL, Triglycerides, and  Waist Circumference were discussed in detail.  2) Blood Pressure /Hypertension: His blood pressure is controlled to target.   he is advised to continue his current medications including lisinopril 5  mg p.o. daily with breakfast .  3) Lipids/Hyperlipidemia:   Review of his recent lipid panel showed  controlled  LDL at 55 .  he  is advised to continue atorvastatin 80 mg p.o. daily at bedtime.    Side effects and precautions discussed with him.  4)  Weight/Diet: His BMI is 32-   clearly complicating his diabetes care.  I discussed with him the fact that loss of 5 - 10% of his  current body weight will have the most impact on his diabetes management.  CDE Consult will be initiated . Exercise, and detailed carbohydrates information provided  -  detailed on discharge instructions.   5) hypogonadism: The circumstance of these diagnoses are not available for review.  He was taking testosterone 200 mg IM every other week until he ran out 4 weeks ago.  He does not have testosterone supplies anymore.   He will be  sent to lab for fasting total testosterone, LH/FSH, prolactin, ferritin and we will discuss these results during his next visit.  -He has an upcoming consultation for urology, encouraged  to keep.  6) Chronic Care/Health Maintenance:  -he  is on ACEI/ARB and Statin medications and  is encouraged to initiate and continue to follow up with Ophthalmology, Dentist,  Podiatrist at least yearly or according to recommendations, and advised to  Quit smoking. I have recommended yearly flu vaccine and pneumonia vaccine at least every 5 years; moderate intensity exercise for up to 150 minutes weekly; and  sleep for at least 7 hours a day.  - he is  advised to maintain close follow up with [place], Rex Kras, MD for primary care needs, as well as his other providers for optimal and coordinated care.  - Patient Care Time Today:  25 min, of which >50% was spent in  counseling and the rest reviewing his  current and  previous labs/studies, previous treatments, his blood glucose readings, and medications' doses and developing a plan for long-term care based on the latest recommendations for standards of care.   Jonathan Gilmore participated in the discussions, expressed understanding, and voiced agreement with the above plans.  All questions were answered to his satisfaction. he is encouraged to contact clinic should he have any questions or concerns prior to his return visit.   Follow up plan: - Return in about 3 weeks (around 03/15/2019), or he will do labs after 03/09/19 , fasting before 8AM, for Follow up with Pre-visit Labs.  Glade Lloyd, MD [facility] [address] [place], [place] [address] Phone: [phone]  Fax: [phone]    02/22/2019, 12:49 PM  This note was partially dictated with voice recognition software. Similar sounding words can be transcribed inadequately or may not  be corrected upon review.

## 2019-02-23 LAB — POCT GLYCOSYLATED HEMOGLOBIN (HGB A1C): Hemoglobin A1C: 9.1 % — AB (ref 4.0–5.6)

## 2019-02-26 DIAGNOSIS — R972 Elevated prostate specific antigen [PSA]: Secondary | ICD-10-CM | POA: Diagnosis not present

## 2019-02-26 DIAGNOSIS — E291 Testicular hypofunction: Secondary | ICD-10-CM | POA: Diagnosis not present

## 2019-02-26 LAB — PROLACTIN: Prolactin: 4.7 ng/mL (ref 2.0–18.0)

## 2019-02-26 LAB — FERRITIN: Ferritin: 117 ng/mL (ref 24–380)

## 2019-02-26 LAB — PSA: PSA: 2.1 ng/mL (ref <=4.0)

## 2019-02-26 LAB — TESTOSTERONE TOTAL,FREE,BIO, MALES: Testosterone: 295 ng/dL (ref 250–827)

## 2019-02-27 LAB — LUTEINIZING HORMONE: LH: 12.8 m[IU]/mL (ref 1.6–15.2)

## 2019-02-27 LAB — TESTOSTERONE TOTAL,FREE,BIO, MALES
Albumin: 4.1 g/dL (ref 3.6–5.1)
Sex Hormone Binding: 56 nmol/L (ref 22–77)
Testosterone, Bioavailable: 45.5 ng/dL — ABNORMAL LOW (ref >=110.0)
Testosterone, Free: 24.1 pg/mL — ABNORMAL LOW (ref 46.0–224.0)

## 2019-02-27 LAB — FOLLICLE STIMULATING HORMONE: FSH: 18.6 m[IU]/mL — ABNORMAL HIGH (ref 1.6–8.0)

## 2019-03-12 ENCOUNTER — Telehealth (INDEPENDENT_AMBULATORY_CARE_PROVIDER_SITE_OTHER): Payer: Medicare HMO | Admitting: Family Medicine

## 2019-03-12 ENCOUNTER — Encounter: Payer: Self-pay | Admitting: Family Medicine

## 2019-03-12 ENCOUNTER — Other Ambulatory Visit: Payer: Self-pay

## 2019-03-12 VITALS — BP 136/76 | HR 76 | Ht 67.0 in | Wt 196.0 lb

## 2019-03-12 DIAGNOSIS — E119 Type 2 diabetes mellitus without complications: Secondary | ICD-10-CM | POA: Diagnosis not present

## 2019-03-12 DIAGNOSIS — G4700 Insomnia, unspecified: Secondary | ICD-10-CM | POA: Diagnosis not present

## 2019-03-12 DIAGNOSIS — I10 Essential (primary) hypertension: Secondary | ICD-10-CM

## 2019-03-12 DIAGNOSIS — R69 Illness, unspecified: Secondary | ICD-10-CM | POA: Diagnosis not present

## 2019-03-12 DIAGNOSIS — F17200 Nicotine dependence, unspecified, uncomplicated: Secondary | ICD-10-CM

## 2019-03-12 NOTE — Progress Notes (Signed)
Virtual Visit via Telephone Note  I connected with Jonathan Gilmore on 03/12/19 at  9:00 AM EST by telephone and verified that I am speaking with the correct person using two identifiers. DOB/address  Location: Patient: home Provider: clinic   I discussed the limitations, risks, security and privacy concerns of performing an evaluation and management service by telephone and the availability of in person appointments. I also discussed with the patient that there may be a patient responsible charge related to this service. The patient expressed understanding and agreed to proceed.   History of Present Illness: DM-checking glucose readings multiple times a day-130-160's per pt-appt with Dr. Dorris Fetch next week  completed blood work  Difficulty sleeping-taking Tylenol PM-no prior use of melatonin. Pt with OSA per old records  HTN-bp normal per pt-no CP/SOB/LE edema Observations/Objective: bp readings normal range per pt Glucose readings 130's fasting  Assessment and Plan: 1. Tobacco dependence Encouraged pt to quit-pt trying to cut down by staying busy  2. Essential hypertension Stable per pt on current medications-no sign of CHF, no CP /SOB   3. Insomnia, unspecified type Pt currently using tylenol PM-discussed melatonin trial for sleep F/u with sleep specialist as discussed with Dr. Luan Pulling  Follow Up Instructions: Keep appt Dr. Dorris Fetch to review blood work F/u 6 months-HTN Keep follow Dr. Harl Bowie CHF   I discussed the assessment and treatment plan with the patient. The patient was provided an opportunity to ask questions and all were answered. The patient agreed with the plan and demonstrated an understanding of the instructions.   The patient was advised to call back or seek an in-person evaluation if the symptoms worsen or if the condition fails to improve as anticipated.  I provided 12 minutes of non-face-to-face time during this encounter.   [name] Hannah Beat, MD

## 2019-03-15 DIAGNOSIS — R69 Illness, unspecified: Secondary | ICD-10-CM | POA: Diagnosis not present

## 2019-03-19 ENCOUNTER — Other Ambulatory Visit: Payer: Self-pay

## 2019-03-19 ENCOUNTER — Ambulatory Visit: Payer: Medicare HMO | Admitting: "Endocrinology

## 2019-03-19 ENCOUNTER — Encounter: Payer: Self-pay | Admitting: "Endocrinology

## 2019-03-19 VITALS — BP 150/79 | HR 79 | Ht 67.0 in | Wt 201.0 lb

## 2019-03-19 DIAGNOSIS — E782 Mixed hyperlipidemia: Secondary | ICD-10-CM

## 2019-03-19 DIAGNOSIS — I10 Essential (primary) hypertension: Secondary | ICD-10-CM | POA: Diagnosis not present

## 2019-03-19 DIAGNOSIS — E291 Testicular hypofunction: Secondary | ICD-10-CM

## 2019-03-19 DIAGNOSIS — E1159 Type 2 diabetes mellitus with other circulatory complications: Secondary | ICD-10-CM | POA: Diagnosis not present

## 2019-03-19 MED ORDER — GABAPENTIN 300 MG PO CAPS: 300.0000 mg | ORAL_CAPSULE | Freq: Two times a day (BID) | ORAL | 2 refills | Status: DC

## 2019-03-19 NOTE — Patient Instructions (Signed)

## 2019-03-19 NOTE — Progress Notes (Signed)
03/19/2019, 1:29 PM  Endocrinology follow-up note  Subjective:    Patient ID: Jonathan Gilmore, male    DOB: [birth year].  Jonathan Gilmore is being seen in follow-up for management of currently uncontrolled symptomatic diabetes requested by  Jonathan Hancock, MD.   Past Medical History:  Diagnosis Date  . Abnormal cardiovascular stress test 03/2013  . Anxiety   . Arthritis   . Back pain   . CHF (congestive heart failure) ([place])   . Coronary artery disease    Multivessel with significant LAD involvement by chest CT 2014  . Depression   . Diastolic dysfunction   . Essential hypertension   . Lumbar herniated disc    L4-L5  . MRSA (methicillin resistant staph aureus) culture positive   . Shortness of breath dyspnea   . Sleep apnea    No cpap  . Type 2 diabetes mellitus ([place])     Past Surgical History:  Procedure Laterality Date  . ANTERIOR CERVICAL DECOMP/DISCECTOMY FUSION  01/18/2011   Procedure: ANTERIOR CERVICAL DECOMPRESSION/DISCECTOMY FUSION 2 LEVELS;  Surgeon: Cooper Render Pool;  Location: [facility];  Service: Neurosurgery;  Laterality: N/A;  anterior cervical discectomy and fusion with allograft and plating, cervical five-six, cervical six-seven  . BIOPSY N/A 04/04/2015   Procedure: BIOPSY;  Surgeon: Rogene Houston, MD;  [facility]: [facility];  Service: Endoscopy;  Laterality: N/A;  . Bone spur removed  1997   Left shoulder  . CARDIAC CATHETERIZATION N/A 08/08/2015   Procedure: Right/Left Heart Cath and Coronary Angiography;  Surgeon: Sherren Mocha, MD;  Location: [facility];  Service: Cardiovascular;  Laterality: N/A;  . CARPAL TUNNEL RELEASE Right   . ESOPHAGEAL DILATION N/A 04/04/2015   Procedure: ESOPHAGEAL DILATION;  Surgeon: Rogene Houston, MD;  Location: [facility];  Service: Endoscopy;  Laterality: N/A;  . ESOPHAGOGASTRODUODENOSCOPY N/A 04/04/2015   Procedure:  ESOPHAGOGASTRODUODENOSCOPY (EGD);  Surgeon: Rogene Houston, MD;  Location: [facility];  Service: Endoscopy;  Laterality: N/A;  1240  . HEMORRHOID SURGERY    . TONSILLECTOMY      Social History   Socioeconomic History  . Marital status: Married    Spouse name: Not on file  . Number of children: Not on file  . Years of education: Not on file  . Highest education level: Not on file  Occupational History  . Not on file  Tobacco Use  . Smoking status: Current Every Day Smoker    Packs/day: 1.00    Years: 50.00    Pack years: 50.00    Types: Cigarettes  . Smokeless tobacco: Former Systems developer  . Tobacco comment: smoking x 50 yrs  Substance and Sexual Activity  . Alcohol use: Yes    Alcohol/week: 1.0 standard drinks    Types: 1 Standard drinks or equivalent per week    Comment: rare  . Drug use: Yes    Types: Marijuana    Comment: as needed  . Sexual activity: Yes  Other Topics Concern  . Not on file  Social History Narrative  . Not on file   Social Determinants of Health  Financial Resource Strain:   . Difficulty of Paying Living Expenses: Not on file  Food Insecurity:   . Worried About Charity fundraiser in the Last Year: Not on file  . Ran Out of Food in the Last Year: Not on file  Transportation Needs:   . Lack of Transportation (Medical): Not on file  . Lack of Transportation (Non-Medical): Not on file  Physical Activity:   . Days of Exercise per Week: Not on file  . Minutes of Exercise per Session: Not on file  Stress:   . Feeling of Stress : Not on file  Social Connections:   . Frequency of Communication with Friends and Family: Not on file  . Frequency of Social Gatherings with Friends and Family: Not on file  . Attends Religious Services: Not on file  . Active Member of Clubs or Organizations: Not on file  . Attends Archivist Meetings: Not on file  . Marital Status: Not on file    Family History  Problem Relation Age of Onset  . Lung cancer  Mother   . Diabetes Father   . Hepatitis B Father   . Diabetes Sister   . Cushing syndrome Sister     Outpatient Encounter Medications as of 03/19/2019  Medication Sig  . ACCU-CHEK AVIVA PLUS test strip USE AS INSTRUCTED  . aspirin EC 81 MG tablet Take 81 mg by mouth daily.  Marland Kitchen atorvastatin (LIPITOR) 80 MG tablet TAKE 1 TABLET BY MOUTH EVERYDAY AT BEDTIME  . CALCIUM-MAGNESIUM-ZINC PO Take 1 tablet by mouth daily.  . Flaxseed, Linseed, (FLAXSEED OIL PO) Take 1 tablet by mouth daily.   . furosemide (LASIX) 40 MG tablet TAKE 1 TABLET BY MOUTH EVERY DAY  . gabapentin (NEURONTIN) 300 MG capsule Take 1 capsule (300 mg total) by mouth 2 (two) times daily.  Marland Kitchen ibuprofen (ADVIL,MOTRIN) 200 MG tablet Take 800 mg by mouth daily as needed for moderate pain.  . Insulin Glargine (LANTUS SOLOSTAR) 100 UNIT/ML Solostar Pen Inject 60 Units into the skin at bedtime.  . insulin lispro (HUMALOG) 100 UNIT/ML KwikPen Inject 1 Units into the skin 3 (three) times daily before meals.  Marland Kitchen lisinopril (ZESTRIL) 10 MG tablet Take 1 tablet (10 mg total) by mouth daily.  . metFORMIN (GLUCOPHAGE) 500 MG tablet Take 1 tablet (500 mg total) by mouth 2 (two) times daily.  . metoprolol succinate (TOPROL-XL) 25 MG 24 hr tablet Take 1 tablet (25 mg total) by mouth at bedtime.  . Multiple Vitamin (MULTIVITAMIN WITH MINERALS) TABS Take 1 tablet by mouth daily.  . nitroGLYCERIN (NITROSTAT) 0.4 MG SL tablet Place 1 tablet (0.4 mg total) under the tongue every 5 (five) minutes as needed for chest pain.  Marland Kitchen testosterone cypionate (DEPOTESTOSTERONE CYPIONATE) 200 MG/ML injection Inject 200 mg into the muscle every 14 (fourteen) days.   . [DISCONTINUED] gabapentin (NEURONTIN) 300 MG capsule Take 1 capsule (300 mg total) by mouth at bedtime. (Patient taking differently: Take 600 mg by mouth at bedtime. )   No facility-administered encounter medications on file as of 03/19/2019.    ALLERGIES: Allergies  Allergen Reactions  . Morphine And  Related Nausea And Vomiting    VACCINATION STATUS: Immunization History  Administered Date(s) Administered  . Fluad Quad(high Dose 65+) 11/29/2018  . Influenza Split 01/19/2011  . Influenza,inj,Quad PF,6+ Mos 02/25/2013  . Pneumococcal Conjugate-13 11/29/2018  . Pneumococcal Polysaccharide-23 02/25/2013    Diabetes He presents for his follow-up diabetic visit. He has type 2 diabetes mellitus.  His disease course has been worsening. There are no hypoglycemic associated symptoms. Pertinent negatives for hypoglycemia include no confusion, headaches, pallor or seizures. Associated symptoms include polydipsia and polyuria. Pertinent negatives for diabetes include no chest pain, no fatigue, no polyphagia and no weakness. There are no hypoglycemic complications. Symptoms are worsening. Diabetic complications include heart disease. Risk factors for coronary artery disease include diabetes mellitus, male sex, tobacco exposure, sedentary lifestyle and obesity. Current diabetic treatment includes insulin injections (He made errors in his insulin administration.  He was supposed to be on Lantus 26 units nightly, however end up doing Lantus 20 units 4 times a day.). His weight is decreasing steadily. He is following a generally unhealthy diet. When asked about meal planning, he reported none. He has not had a previous visit with a dietitian. He never participates in exercise. His home blood glucose trend is increasing steadily. (He did not monitor blood glucose regularly of late, his A1c today was 9.1% increasing from 8.8%.  ) An ACE inhibitor/angiotensin II receptor blocker is being taken. Eye exam is current.  Hypertension This is a chronic problem. The current episode started more than 1 year ago. The problem is uncontrolled. Pertinent negatives include no chest pain, headaches, neck pain, palpitations or shortness of breath. Risk factors for coronary artery disease include diabetes mellitus, male gender,  obesity, sedentary lifestyle and smoking/tobacco exposure. Past treatments include ACE inhibitors.     Constitutional: no weight gain/loss, + fatigue, no subjective hyperthermia, no subjective hypothermia Eyes: no blurry vision, no xerophthalmia ENT: no sore throat, no nodules palpated in throat, no dysphagia/odynophagia, no hoarseness Cardiovascular: no Chest Pain, no Shortness of Breath, no palpitations, no leg swelling Respiratory: + cough, + exertional SOB Gastrointestinal: no Nausea/Vomiting/Diarhhea Musculoskeletal: no muscle/ +joint aches Skin: no rashes Neurological: no tremors, no numbness, no tingling, no dizziness Psychiatric: no depression, no anxiety   Objective:    BP (!) 150/79   Pulse 79   Ht 5\' 7"  (1.702 m)   Wt 201 lb (91.2 kg)   BMI 31.48 kg/m   Wt Readings from Last 3 Encounters:  03/19/19 201 lb (91.2 kg)  03/12/19 196 lb (88.9 kg)  02/22/19 196 lb (88.9 kg)      CMP ( most recent) CMP     Component Value Date/Time   NA 140 11/14/2018 1038   K 4.1 11/14/2018 1038   CL 106 11/14/2018 1038   CO2 28 11/14/2018 1038   GLUCOSE 177 (H) 11/14/2018 1038   BUN 10 11/14/2018 1038   CREATININE 0.72 11/14/2018 1038   CALCIUM 8.9 11/14/2018 1038   PROT 6.4 11/14/2018 1038   ALBUMIN 3.7 08/08/2015 0335   AST 17 11/14/2018 1038   ALT 16 11/14/2018 1038   ALKPHOS 74 08/08/2015 0335   BILITOT 0.5 11/14/2018 1038   GFRNONAA 97 11/14/2018 1038   GFRAA 112 11/14/2018 1038     Diabetic Labs (most recent): Lab Results  Component Value Date   HGBA1C 9.1 (A) 02/23/2019   HGBA1C 9.1 02/22/2019   HGBA1C 8.8 (A) 11/13/2018     Lipid Panel ( most recent) Lipid Panel     Component Value Date/Time   CHOL 82 11/14/2018 1038   TRIG 137 11/14/2018 1038   HDL 25 (L) 11/14/2018 1038   CHOLHDL 3.3 11/14/2018 1038   VLDL 39 08/07/2015 0509   LDLCALC 35 11/14/2018 1038      Lab Results  Component Value Date   TSH 1.96 11/14/2018   TSH 0.421 09/19/2013  Assessment & Plan:   1. DM type 2 causing vascular disease ([place])  - [place] has currently uncontrolled symptomatic type 2 DM since 69 years of age. -Patient did not bring any logs nor meter to review.  He denies hypoglycemia nor hyperglycemia above 300 mg per DL.  His recent point-of-care A1c was 9.1%.   -his diabetes is complicated by coronary artery disease,  heavy smoking, and he remains at a high risk for more acute and chronic complications which include CAD, CVA, CKD, retinopathy, and neuropathy. These are all discussed in detail with him.  - I have counseled him on diet management and weight loss, by adopting a carbohydrate restricted/protein rich diet.  - he  admits there is a room for improvement in his diet and drink choices. -  Suggestion is made for him to avoid simple carbohydrates  from his diet including Cakes, Sweet Desserts / Pastries, Ice Cream, Soda (diet and regular), Sweet Tea, Candies, Chips, Cookies, Sweet Pastries,  Store Bought Juices, Alcohol in Excess of  1-2 drinks a day, Artificial Sweeteners, Coffee Creamer, and "Sugar-free" Products. This will help patient to have stable blood glucose profile and potentially avoid unintended weight gain.  - I encouraged him to switch to  unprocessed or minimally processed complex starch and increased protein intake (animal or plant source), fruits, and vegetables.  - he is advised to stick to a routine mealtimes to eat 3 meals  a day and avoid unnecessary snacks ( to snack only to correct hypoglycemia).   - he has been  scheduled with Jearld Fenton, RDN, CDE for diabetes education.  - I have approached him with the following individualized plan to manage  his diabetes and patient agrees:   -Once again, I had a long discussion about safe use of insulin and proper administration.  He is also approached to start strict monitoring of blood glucose for safe use of insulin. -He is advised to continue Lantus 60 units  nightly, associated with monitoring of blood glucose 2 times a day-daily before breakfast and at bedtime.    -He has dropped the idea of clearance for back surgery.   - He is advised to stay off of Humalog for now. - he is encouraged to call clinic for blood glucose levels less than 70 or above 200 mg /dl. - he is advised to continue metformin 500 mg p.o. twice daily, therapeutically suitable for patient. He is not a candidate for SGLT2 inhibitors nor incretin therapy given his chronic heavy smoking.   - Patient specific target  A1c;  LDL, HDL, Triglycerides, and  Waist Circumference were discussed in detail.  2) Blood Pressure /Hypertension: His blood pressure is not controlled to target.    he is advised to continue his current medications including lisinopril 5 mg p.o. daily with breakfast .  3) Lipids/Hyperlipidemia:   Review of his recent lipid panel showed  controlled  LDL at 55 .  he  is advised to continue atorvastatin 80 mg p.o. daily at bedtime.     Side effects and precautions discussed with him.  4)  Weight/Diet: His BMI is 32-   clearly complicating his diabetes care.  I discussed with him the fact that loss of 5 - 10% of his  current body weight will have the most impact on his diabetes management.  CDE Consult will be initiated . Exercise, and detailed carbohydrates information provided  -  detailed on discharge instructions.   5) hypogonadism: The circumstance of these  diagnoses are not available for review.  He previously took testosterone 200 mg IM every other week per his report.  He is previsit fasting total testosterone level is at 295, after at least 6 weeks without any testosterone supplement.  -He is not a candidate for testosterone treatment at this time.  He will have 200 testosterone measurement at least twice a year going forward.  -He has an upcoming consultation for urology, encouraged  to keep.  6) Chronic Care/Health Maintenance:  -he  is on ACEI/ARB and  Statin medications and  is encouraged to initiate and continue to follow up with Ophthalmology, Dentist,  Podiatrist at least yearly or according to recommendations, and advised to  Quit smoking. I have recommended yearly flu vaccine and pneumonia vaccine at least every 5 years; moderate intensity exercise for up to 150 minutes weekly; and  sleep for at least 7 hours a day. -A prescription for gabapentin 300 mg p.o. twice daily for diabetic neuropathy was refilled for him.  - he is  advised to maintain close follow up with [place], Rex Kras, MD for primary care needs, as well as his other providers for optimal and coordinated care.  - Time spent on this patient care encounter:  35 min, of which > 50% was spent in  counseling and the rest reviewing his blood glucose logs , discussing his hypoglycemia and hyperglycemia episodes, reviewing his current and  previous labs / studies  ( including abstraction from other facilities) and medications  doses and developing a  long term treatment plan and documenting his care.   Please refer to Patient Instructions for Blood Glucose Monitoring and Insulin/Medications Dosing Guide"  in media tab for additional information. Please  also refer to " Patient Self Inventory" in the Media  tab for reviewed elements of pertinent patient history.  Elden D Heckman participated in the discussions, expressed understanding, and voiced agreement with the above plans.  All questions were answered to his satisfaction. he is encouraged to contact clinic should he have any questions or concerns prior to his return visit.  Follow up plan: - Return in about 3 months (around 06/17/2019) for Bring Meter and Logs- A1c in Office.  Glade Lloyd, MD [facility] [address] [place], [place] [address] Phone: [phone]  Fax: [phone]    03/19/2019, 1:29 PM  This note was partially dictated with voice recognition software. Similar  sounding words can be transcribed inadequately or may not  be corrected upon review.

## 2019-03-22 ENCOUNTER — Telehealth: Payer: Self-pay | Admitting: Cardiology

## 2019-03-22 NOTE — Telephone Encounter (Signed)
Spoke with pt, advised current prescribed dosage of Lisinopril is 10mg .

## 2019-03-22 NOTE — Telephone Encounter (Signed)
Pt is calling about his cholestorolol medicine, the bottle he received from the pharmacy has a different dose than what he normally gets   Please call [phone]

## 2019-03-27 ENCOUNTER — Other Ambulatory Visit: Payer: Self-pay

## 2019-03-27 MED ORDER — LANTUS SOLOSTAR 100 UNIT/ML ~~LOC~~ SOPN: 60.0000 [IU] | PEN_INJECTOR | Freq: Every day | SUBCUTANEOUS | 0 refills | Status: DC

## 2019-04-26 ENCOUNTER — Telehealth: Payer: Self-pay | Admitting: Cardiology

## 2019-04-26 NOTE — Telephone Encounter (Signed)
Per phone call from pt-- he's had decreased urine output for the past 2 weeks and has swelling in his feet in the mornings. Would like to know if he should take more medication   Please call [phone]

## 2019-04-26 NOTE — Telephone Encounter (Signed)
Called pt. No answer. Left message for pt to return call.  

## 2019-04-27 NOTE — Telephone Encounter (Signed)
Pt returned call. He states that for past 2 weeks he can tell a difference in his urine output after taking his lasix 40 mg daily. He states that he is having more swelling in his feet, but is not gaining any weight. He does not have any SOB. He has not changed the way he eats or drinks. He has had recent lab work with pcp, which I have requested. He asks if his dosage of lasix can be increased. Please advise.

## 2019-04-27 NOTE — Telephone Encounter (Signed)
If taking lasix 40mg  daily increase to 60mg  daily over the weekend and update [place] on Monday  Zandra Abts MD

## 2019-04-27 NOTE — Telephone Encounter (Signed)
Pt made aware. He voiced understanding.  

## 2019-04-27 NOTE — Telephone Encounter (Signed)
Called pt,. No answer. Unable to leave voicemail.

## 2019-05-29 ENCOUNTER — Other Ambulatory Visit: Payer: Self-pay | Admitting: "Endocrinology

## 2019-05-30 ENCOUNTER — Other Ambulatory Visit: Payer: Self-pay | Admitting: Cardiology

## 2019-06-13 ENCOUNTER — Other Ambulatory Visit: Payer: Self-pay | Admitting: Family Medicine

## 2019-06-19 ENCOUNTER — Ambulatory Visit: Payer: Medicare HMO | Admitting: "Endocrinology

## 2019-06-19 ENCOUNTER — Other Ambulatory Visit: Payer: Self-pay

## 2019-06-19 ENCOUNTER — Encounter: Payer: Self-pay | Admitting: "Endocrinology

## 2019-06-19 VITALS — BP 135/73 | HR 57 | Ht 67.0 in | Wt 208.2 lb

## 2019-06-19 DIAGNOSIS — E782 Mixed hyperlipidemia: Secondary | ICD-10-CM

## 2019-06-19 DIAGNOSIS — E291 Testicular hypofunction: Secondary | ICD-10-CM

## 2019-06-19 DIAGNOSIS — I10 Essential (primary) hypertension: Secondary | ICD-10-CM | POA: Diagnosis not present

## 2019-06-19 DIAGNOSIS — E1159 Type 2 diabetes mellitus with other circulatory complications: Secondary | ICD-10-CM

## 2019-06-19 LAB — POCT GLYCOSYLATED HEMOGLOBIN (HGB A1C): Hemoglobin A1C: 7.7 % — AB (ref 4.0–5.6)

## 2019-06-19 NOTE — Progress Notes (Signed)
06/19/2019, 5:52 PM  Endocrinology follow-up note  Subjective:    Patient ID: Jonathan Gilmore, male    DOB: 02-25-1951.  Noha D Ata is being seen in follow-up for management of currently uncontrolled symptomatic diabetes requested by  Maryruth Hancock, MD.   Past Medical History:  Diagnosis Date  . Abnormal cardiovascular stress test 03/2013  . Anxiety   . Arthritis   . Back pain   . CHF (congestive heart failure) ([place])   . Coronary artery disease    Multivessel with significant LAD involvement by chest CT 2014  . Depression   . Diastolic dysfunction   . Essential hypertension   . Lumbar herniated disc    L4-L5  . MRSA (methicillin resistant staph aureus) culture positive   . Shortness of breath dyspnea   . Sleep apnea    No cpap  . Type 2 diabetes mellitus ([place])     Past Surgical History:  Procedure Laterality Date  . ANTERIOR CERVICAL DECOMP/DISCECTOMY FUSION  01/18/2011   Procedure: ANTERIOR CERVICAL DECOMPRESSION/DISCECTOMY FUSION 2 LEVELS;  Surgeon: Cooper Render Pool;  Location: [facility];  Service: Neurosurgery;  Laterality: N/A;  anterior cervical discectomy and fusion with allograft and plating, cervical five-six, cervical six-seven  . BIOPSY N/A 04/04/2015   Procedure: BIOPSY;  Surgeon: Rogene Houston, MD;  [facility]: [facility];  Service: Endoscopy;  Laterality: N/A;  . Bone spur removed  1997   Left shoulder  . CARDIAC CATHETERIZATION N/A 08/08/2015   Procedure: Right/Left Heart Cath and Coronary Angiography;  Surgeon: Sherren Mocha, MD;  Location: [facility];  Service: Cardiovascular;  Laterality: N/A;  . CARPAL TUNNEL RELEASE Right   . ESOPHAGEAL DILATION N/A 04/04/2015   Procedure: ESOPHAGEAL DILATION;  Surgeon: Rogene Houston, MD;  Location: [facility];  Service: Endoscopy;  Laterality: N/A;  . ESOPHAGOGASTRODUODENOSCOPY N/A 04/04/2015   Procedure:  ESOPHAGOGASTRODUODENOSCOPY (EGD);  Surgeon: Rogene Houston, MD;  Location: [facility];  Service: Endoscopy;  Laterality: N/A;  1240  . HEMORRHOID SURGERY    . TONSILLECTOMY      Social History   Socioeconomic History  . Marital status: Married    Spouse name: Not on file  . Number of children: Not on file  . Years of education: Not on file  . Highest education level: Not on file  Occupational History  . Not on file  Tobacco Use  . Smoking status: Current Every Day Smoker    Packs/day: 1.00    Years: 50.00    Pack years: 50.00    Types: Cigarettes  . Smokeless tobacco: Former Systems developer  . Tobacco comment: smoking x 50 yrs  Substance and Sexual Activity  . Alcohol use: Yes    Alcohol/week: 1.0 standard drinks    Types: 1 Standard drinks or equivalent per week    Comment: rare  . Drug use: Yes    Types: Marijuana    Comment: as needed  . Sexual activity: Yes  Other Topics Concern  . Not on file  Social History Narrative  . Not on file   Social Determinants of Health  Financial Resource Strain:   . Difficulty of Paying Living Expenses:   Food Insecurity:   . Worried About Charity fundraiser in the Last Year:   . Arboriculturist in the Last Year:   Transportation Needs:   . Film/video editor (Medical):   Marland Kitchen Lack of Transportation (Non-Medical):   Physical Activity:   . Days of Exercise per Week:   . Minutes of Exercise per Session:   Stress:   . Feeling of Stress :   Social Connections:   . Frequency of Communication with Friends and Family:   . Frequency of Social Gatherings with Friends and Family:   . Attends Religious Services:   . Active Member of Clubs or Organizations:   . Attends Archivist Meetings:   Marland Kitchen Marital Status:     Family History  Problem Relation Age of Onset  . Lung cancer Mother   . Diabetes Father   . Hepatitis B Father   . Diabetes Sister   . Cushing syndrome Sister     Outpatient Encounter Medications as of  06/19/2019  Medication Sig  . ACCU-CHEK AVIVA PLUS test strip USE AS INSTRUCTED  . aspirin EC 81 MG tablet Take 81 mg by mouth daily.  Marland Kitchen atorvastatin (LIPITOR) 80 MG tablet TAKE 1 TABLET BY MOUTH EVERYDAY AT BEDTIME  . CALCIUM-MAGNESIUM-ZINC PO Take 1 tablet by mouth daily.  . Flaxseed, Linseed, (FLAXSEED OIL PO) Take 1 tablet by mouth daily.   . furosemide (LASIX) 40 MG tablet TAKE 1 TABLET BY MOUTH EVERY DAY  . gabapentin (NEURONTIN) 300 MG capsule Take 1 capsule (300 mg total) by mouth 2 (two) times daily.  Marland Kitchen ibuprofen (ADVIL,MOTRIN) 200 MG tablet Take 800 mg by mouth daily as needed for moderate pain.  . Insulin Glargine (LANTUS SOLOSTAR) 100 UNIT/ML Solostar Pen Inject 60 Units into the skin at bedtime.  Marland Kitchen lisinopril (ZESTRIL) 10 MG tablet Take 1 tablet (10 mg total) by mouth daily.  . metFORMIN (GLUCOPHAGE) 500 MG tablet TAKE 1 TABLET TWICE A DAY  . metoprolol succinate (TOPROL-XL) 25 MG 24 hr tablet Take 1 tablet (25 mg total) by mouth at bedtime.  . Multiple Vitamin (MULTIVITAMIN WITH MINERALS) TABS Take 1 tablet by mouth daily.  . nitroGLYCERIN (NITROSTAT) 0.4 MG SL tablet Place 1 tablet (0.4 mg total) under the tongue every 5 (five) minutes as needed for chest pain.  . [DISCONTINUED] insulin lispro (HUMALOG) 100 UNIT/ML KwikPen Inject 1 Units into the skin 3 (three) times daily before meals.  . [DISCONTINUED] testosterone cypionate (DEPOTESTOSTERONE CYPIONATE) 200 MG/ML injection Inject 200 mg into the muscle every 14 (fourteen) days.    No facility-administered encounter medications on file as of 06/19/2019.    ALLERGIES: Allergies  Allergen Reactions  . Morphine And Related Nausea And Vomiting    VACCINATION STATUS: Immunization History  Administered Date(s) Administered  . Fluad Quad(high Dose 65+) 11/29/2018  . Influenza Split 01/19/2011  . Influenza,inj,Quad PF,6+ Mos 02/25/2013  . Pneumococcal Conjugate-13 11/29/2018  . Pneumococcal Polysaccharide-23 02/25/2013     Diabetes He presents for his follow-up diabetic visit. He has type 2 diabetes mellitus. His disease course has been improving. There are no hypoglycemic associated symptoms. Pertinent negatives for hypoglycemia include no confusion, headaches, pallor or seizures. Associated symptoms include polydipsia and polyuria. Pertinent negatives for diabetes include no chest pain, no fatigue, no polyphagia and no weakness. There are no hypoglycemic complications. Symptoms are improving. Diabetic complications include heart disease. Risk factors for coronary  artery disease include diabetes mellitus, male sex, tobacco exposure, sedentary lifestyle and obesity. Current diabetic treatment includes insulin injections (He made errors in his insulin administration.  He was supposed to be on Lantus 26 units nightly, however end up doing Lantus 20 units 4 times a day.). His weight is decreasing steadily. He is following a generally unhealthy diet. When asked about meal planning, he reported none. He has not had a previous visit with a dietitian. He never participates in exercise. His home blood glucose trend is increasing steadily. His lunch blood glucose range is generally 140-180 mg/dl. His bedtime blood glucose range is generally >200 mg/dl. His overall blood glucose range is 180-200 mg/dl. (He did not monitor blood glucose regularly of late, his A1c today was 9.1% increasing from 8.8%.  ) An ACE inhibitor/angiotensin II receptor blocker is being taken. Eye exam is current.  Hypertension This is a chronic problem. The current episode started more than 1 year ago. The problem is uncontrolled. Pertinent negatives include no chest pain, headaches, neck pain, palpitations or shortness of breath. Risk factors for coronary artery disease include diabetes mellitus, male gender, obesity, sedentary lifestyle and smoking/tobacco exposure. Past treatments include ACE inhibitors.    Review of systems  Constitutional: no weight  gain/loss, + fatigue, no subjective hyperthermia, no subjective hypothermia Eyes: no blurry vision, no xerophthalmia ENT: no sore throat, no nodules palpated in throat, no dysphagia/odynophagia, no hoarseness Cardiovascular: no Chest Pain, no Shortness of Breath, no palpitations, no leg swelling Respiratory: + cough, + exertional SOB Gastrointestinal: no Nausea/Vomiting/Diarhhea Musculoskeletal: no muscle/ +joint aches Skin: no rashes Neurological: no tremors, no numbness, no tingling, no dizziness Psychiatric: no depression, no anxiety   Objective:    BP 135/73   Pulse (!) 57   Ht 5\' 7"  (1.702 m)   Wt 208 lb 3.2 oz (94.4 kg)   BMI 32.61 kg/m   Wt Readings from Last 3 Encounters:  06/19/19 208 lb 3.2 oz (94.4 kg)  03/19/19 201 lb (91.2 kg)  03/12/19 196 lb (88.9 kg)      Physical Exam- Limited  Constitutional:  Body mass index is 32.61 kg/m. , not in acute distress, normal state of mind Eyes:  EOMI, no exophthalmos Neck: Supple Thyroid: No gross goiter Respiratory: Adequate breathing efforts Musculoskeletal: no gross deformities, strength intact in all four extremities, no gross restriction of joint movements Skin:  no rashes, no hyperemia Neurological: no tremor with outstretched hands,   CMP ( most recent) CMP     Component Value Date/Time   NA 140 11/14/2018 1038   K 4.1 11/14/2018 1038   CL 106 11/14/2018 1038   CO2 28 11/14/2018 1038   GLUCOSE 177 (H) 11/14/2018 1038   BUN 10 11/14/2018 1038   CREATININE 0.72 11/14/2018 1038   CALCIUM 8.9 11/14/2018 1038   PROT 6.4 11/14/2018 1038   ALBUMIN 3.7 08/08/2015 0335   AST 17 11/14/2018 1038   ALT 16 11/14/2018 1038   ALKPHOS 74 08/08/2015 0335   BILITOT 0.5 11/14/2018 1038   GFRNONAA 97 11/14/2018 1038   GFRAA 112 11/14/2018 1038     Diabetic Labs (most recent): Lab Results  Component Value Date   HGBA1C 7.7 (A) 06/19/2019   HGBA1C 9.1 (A) 02/23/2019   HGBA1C 9.1 02/22/2019     Lipid Panel ( most  recent) Lipid Panel     Component Value Date/Time   CHOL 82 11/14/2018 1038   TRIG 137 11/14/2018 1038   HDL 25 (L) 11/14/2018 1038  CHOLHDL 3.3 11/14/2018 1038   VLDL 39 08/07/2015 0509   LDLCALC 35 11/14/2018 1038      Lab Results  Component Value Date   TSH 1.96 11/14/2018   TSH 0.421 09/19/2013      Assessment & Plan:   1. DM type 2 causing vascular disease ([place])  - [place] has currently uncontrolled symptomatic type 2 DM since 69 years of age. -Patient did  bring his logs showing near target glycemic profile both fasting and postprandial.  He did not document any hypoglycemia.  His point-of-care A1c is improved to 7.7% from 9.1%.    -his diabetes is complicated by coronary artery disease,  heavy smoking, and he remains at a high risk for more acute and chronic complications which include CAD, CVA, CKD, retinopathy, and neuropathy. These are all discussed in detail with him.  - I have counseled him on diet management and weight loss, by adopting a carbohydrate restricted/protein rich diet.  - he  admits there is a room for improvement in his diet and drink choices. -  Suggestion is made for him to avoid simple carbohydrates  from his diet including Cakes, Sweet Desserts / Pastries, Ice Cream, Soda (diet and regular), Sweet Tea, Candies, Chips, Cookies, Sweet Pastries,  Store Bought Juices, Alcohol in Excess of  1-2 drinks a day, Artificial Sweeteners, Coffee Creamer, and "Sugar-free" Products. This will help patient to have stable blood glucose profile and potentially avoid unintended weight gain.  - I encouraged him to switch to  unprocessed or minimally processed complex starch and increased protein intake (animal or plant source), fruits, and vegetables.  - he is advised to stick to a routine mealtimes to eat 3 meals  a day and avoid unnecessary snacks ( to snack only to correct hypoglycemia).   - he has been  scheduled with Jearld Fenton, RDN, CDE for diabetes  education.  - I have approached him with the following individualized plan to manage  his diabetes and patient agrees:   -He displays better engagement this time, I had a long discussion about safe use of insulin and proper administration.  -He will continue to need at least basal insulin in order for him to maintain control of diabetes to target. -Accordingly, he is advised to be consistent and take his Lantus at 60 units nightly, associated with monitoring of blood glucose 2 times a day-daily before breakfast and at bedtime.    -He has dropped the idea of clearance for back surgery.   - He is advised to stay off of Humalog for now. - he is encouraged to call clinic for blood glucose levels less than 70 or above 200 mg /dl. - he is advised to continue Metformin 500 mg p.o. twice daily therapeutically suitable for patient. He is not a candidate for SGLT2 inhibitors nor incretin therapy given his chronic heavy smoking.    - Patient specific target  A1c;  LDL, HDL, Triglycerides, were discussed in detail.  2) Blood Pressure /Hypertension: -His blood pressure is controlled to target.   he is advised to continue his current medications including lisinopril 5 mg p.o. daily with breakfast .  3) Lipids/Hyperlipidemia:   Review of his recent lipid panel showed  controlled  LDL at 55 .  he  is advised to continue atorvastatin 80 mg p.o. daily at bedtime.       Side effects and precautions discussed with him.  4)  Weight/Diet: His BMI is 32-   clearly complicating his  diabetes care.  He is a candidate for modest weight loss.  I discussed with him the fact that loss of 5 - 10% of his  current body weight will have the most impact on his diabetes management.  CDE Consult will be initiated . Exercise, and detailed carbohydrates information provided  -  detailed on discharge instructions.   5) hypogonadism: The circumstance of these diagnoses are not available for review.  He previously took testosterone  200 mg IM every other week per his report.  He is previsit fasting total testosterone level is at 295, after at least 6 weeks without any testosterone supplement.  -He is not a candidate for testosterone treatment at this time.  He will have total testosterone measurement for his next visit.   -He has an upcoming consultation for urology, encouraged  to keep.  6) Chronic Care/Health Maintenance:  -he  is on ACEI/ARB and Statin medications and  is encouraged to initiate and continue to follow up with Ophthalmology, Dentist,  Podiatrist at least yearly or according to recommendations, and advised to  Quit smoking. I have recommended yearly flu vaccine and pneumonia vaccine at least every 5 years; moderate intensity exercise for up to 150 minutes weekly; and  sleep for at least 7 hours a day. -A prescription for gabapentin 300 mg p.o. twice daily for diabetic neuropathy was refilled for him.  - he is  advised to maintain close follow up with [place], Rex Kras, MD for primary care needs, as well as his other providers for optimal and coordinated care.  - Time spent on this patient care encounter:  40 min, of which > 50% was spent in  counseling and the rest reviewing his blood glucose logs , discussing his hypoglycemia and hyperglycemia episodes, reviewing his current and  previous labs / studies  ( including abstraction from other facilities) and medications  doses and developing a  long term treatment plan and documenting his care.   Please refer to Patient Instructions for Blood Glucose Monitoring and Insulin/Medications Dosing Guide"  in media tab for additional information. Please  also refer to " Patient Self Inventory" in the Media  tab for reviewed elements of pertinent patient history.  Kel D Drewes participated in the discussions, expressed understanding, and voiced agreement with the above plans.  All questions were answered to his satisfaction. he is encouraged to contact clinic should he have  any questions or concerns prior to his return visit.   Follow up plan: - Return in about 3 months (around 09/18/2019) for Bring Meter and Logs- A1c in Office, Follow up with Pre-visit Labs.  Glade Lloyd, MD [facility] [address] [place], [place] [address] Phone: [phone]  Fax: [phone]    06/19/2019, 5:52 PM  This note was partially dictated with voice recognition software. Similar sounding words can be transcribed inadequately or may not  be corrected upon review.

## 2019-06-19 NOTE — Patient Instructions (Signed)

## 2019-06-23 ENCOUNTER — Emergency Department (HOSPITAL_COMMUNITY)
Admission: EM | Admit: 2019-06-23 | Discharge: 2019-06-23 | Disposition: A | Discharge status: Home / Self Care | Payer: Medicare HMO | Attending: Emergency Medicine | Admitting: Emergency Medicine

## 2019-06-23 ENCOUNTER — Encounter (HOSPITAL_COMMUNITY): Payer: Self-pay | Admitting: Emergency Medicine

## 2019-06-23 ENCOUNTER — Emergency Department (HOSPITAL_COMMUNITY): Payer: Medicare HMO

## 2019-06-23 ENCOUNTER — Other Ambulatory Visit: Payer: Self-pay

## 2019-06-23 DIAGNOSIS — R079 Chest pain, unspecified: Secondary | ICD-10-CM | POA: Diagnosis present

## 2019-06-23 DIAGNOSIS — Z5321 Procedure and treatment not carried out due to patient leaving prior to being seen by health care provider: Secondary | ICD-10-CM | POA: Insufficient documentation

## 2019-06-23 LAB — BASIC METABOLIC PANEL
Anion gap: 8 (ref 5–15)
BUN: 12 mg/dL (ref 8–23)
CO2: 25 mmol/L (ref 22–32)
Calcium: 8.4 mg/dL — ABNORMAL LOW (ref 8.9–10.3)
Chloride: 104 mmol/L (ref 98–111)
Creatinine, Ser: 0.53 mg/dL — ABNORMAL LOW (ref 0.61–1.24)
GFR calc Af Amer: >60 mL/min (ref >60)
GFR calc non Af Amer: >60 mL/min (ref >60)
Glucose, Bld: 102 mg/dL — ABNORMAL HIGH (ref 70–99)
Potassium: 3.6 mmol/L (ref 3.5–5.1)
Sodium: 137 mmol/L (ref 135–145)

## 2019-06-23 LAB — CBC
HCT: 42.7 % (ref 39.0–52.0)
Hemoglobin: 14 g/dL (ref 13.0–17.0)
MCH: 30.9 pg (ref 26.0–34.0)
MCHC: 32.8 g/dL (ref 30.0–36.0)
MCV: 94.3 fL (ref 80.0–100.0)
Platelets: 206 10*3/uL (ref 150–400)
RBC: 4.53 MIL/uL (ref 4.22–5.81)
RDW: 13.1 % (ref 11.5–15.5)
WBC: 8.7 10*3/uL (ref 4.0–10.5)
nRBC: 0 % (ref 0.0–0.2)

## 2019-06-23 LAB — TROPONIN I (HIGH SENSITIVITY): Troponin I (High Sensitivity): 8 ng/L (ref <18)

## 2019-06-23 NOTE — ED Triage Notes (Signed)
Pt complains of chest pain that began this morning at 0530. Patient too two aspirin this morning, unknown dosage, and were not chewable. Hx of CHF.  Patient states in waiting room were is she I am dying here. Patient apologizes to this nurse.

## 2019-06-30 ENCOUNTER — Other Ambulatory Visit: Payer: Self-pay | Admitting: "Endocrinology

## 2019-07-12 ENCOUNTER — Telehealth: Payer: Self-pay | Admitting: "Endocrinology

## 2019-07-12 NOTE — Telephone Encounter (Signed)
Pt states he takes lantus 60 units every night and his BG readings have been between 140-215 but this morning his BG was 74, he experienced kightheadedness and his hands shaking. States he ate an orange and a piece of candy and his BG came up to 160 and he felt better. Pt states he does not always eat three meals a day and did not eat breakfast yesterday and had a ham sandwich for dinner last night. Discussed pt's symptoms and BG with Dr.Nida, advised pt to lower his lantus to 50 units each night and advised pt to be sure he's eating three meals a day per Dr.Nida, understanding voiced.

## 2019-07-12 NOTE — Telephone Encounter (Signed)
Pt left VM that he is seeing black spots and his sugar is getting down to 75

## 2019-07-18 ENCOUNTER — Encounter: Payer: Self-pay | Admitting: Cardiology

## 2019-07-18 ENCOUNTER — Telehealth (INDEPENDENT_AMBULATORY_CARE_PROVIDER_SITE_OTHER): Payer: Medicare HMO | Admitting: Cardiology

## 2019-07-18 VITALS — Ht 67.0 in | Wt 200.0 lb

## 2019-07-18 DIAGNOSIS — E782 Mixed hyperlipidemia: Secondary | ICD-10-CM

## 2019-07-18 DIAGNOSIS — I350 Nonrheumatic aortic (valve) stenosis: Secondary | ICD-10-CM

## 2019-07-18 DIAGNOSIS — I2510 Atherosclerotic heart disease of native coronary artery without angina pectoris: Secondary | ICD-10-CM | POA: Diagnosis not present

## 2019-07-18 DIAGNOSIS — E785 Hyperlipidemia, unspecified: Secondary | ICD-10-CM

## 2019-07-18 DIAGNOSIS — I110 Hypertensive heart disease with heart failure: Secondary | ICD-10-CM | POA: Diagnosis not present

## 2019-07-18 DIAGNOSIS — R1310 Dysphagia, unspecified: Secondary | ICD-10-CM

## 2019-07-18 DIAGNOSIS — I10 Essential (primary) hypertension: Secondary | ICD-10-CM

## 2019-07-18 DIAGNOSIS — I5032 Chronic diastolic (congestive) heart failure: Secondary | ICD-10-CM

## 2019-07-18 MED ORDER — NITROGLYCERIN 0.4 MG SL SUBL: 0.4000 mg | SUBLINGUAL_TABLET | SUBLINGUAL | 3 refills | Status: DC | PRN

## 2019-07-18 NOTE — Patient Instructions (Signed)
Your physician wants you to follow-up in: [place] will receive a reminder letter in the mail two months in advance. If you don't receive a letter, please call our office to schedule the follow-up appointment.  Your physician recommends that you continue on your current medications as directed. Please refer to the Current Medication list given to you today.  You have been referred to [facility]  Thank you for choosing [facility]!!

## 2019-07-18 NOTE — Addendum Note (Signed)
Addended by: Julian Hy T on: 07/18/2019 12:01 PM   Modules accepted: Orders

## 2019-07-18 NOTE — Progress Notes (Signed)
Virtual Visit via Telephone Note   This visit type was conducted due to national recommendations for restrictions regarding the COVID-19 Pandemic (e.g. social distancing) in an effort to limit this patient's exposure and mitigate transmission in our community.  Due to his co-morbid illnesses, this patient is at least at moderate risk for complications without adequate follow up.  This format is felt to be most appropriate for this patient at this time.  The patient did not have access to video technology/had technical difficulties with video requiring transitioning to audio format only (telephone).  All issues noted in this document were discussed and addressed.  No physical exam could be performed with this format.  Please refer to the patient's chart for his  consent to telehealth for [facility].   The patient was identified using 2 identifiers.  Date:  07/18/2019   ID:  Jonathan Gilmore, DOB 1950-12-30, MRN [id number]  Patient Location: Home Provider Location: Office  PCP:  Jonathan Hancock, MD  Cardiologist:  Jonathan Dolly, MD  Electrophysiologist:  None   Evaluation Performed:  Follow-Up Visit  Chief Complaint:  Follow up visit  History of Present Illness:    Jonathan Gilmore is a 69 y.o. male seen today for follow up of the following medical problems.    1. CAD - CT scan chest 02/2013 with 3 vessel CAD, severe LAD disease. - Jan 2015 nuclear stress without clear ischemia - admit 07/2015 with acute pulmonary edema in setting of severe HTN. Referred for cath.  - cath 07/2015 with 50% LAD disease, overall patent coronaries - his presentation with pulmonary edema likely secondary to severe HTN in setting of restrictive diastolic dysfunction. - echo 07/2015 LVEF 45-50%, restrictive diastolic dysfunction    -no recent chest pain, no SOB/DOE - compliant with  meds  2. Severe OSA - not using CPAP machine due to discomfort. Stopped several years ago   3.  Hyperlipidemia -11/2018 TC 82 HDL 25 TG 137 LDL 35 - he is compliant with statin  4. Aortic stenosis - echo 07/2015 mild to moderate. Meangradient 16, AVA reported at 0.9 with dimensionless index 0.17 Mar 2018 echo LVEF 60-65%, mild to moderate stenosis - denies any symptoms  5. HTN -bp 135/73 at endo visit last month - compliant wit meds   6. Chronic diastolic HF - echo 123456 with restrictive diastolic dysfunction. Low normal to mildly decreased LVEF at 45-50%.  -no recent symptoms, no edema  - some increased edema in 04/2019, he had given our office a call - was to increase lasix to 60mg  daily for a few days.  - swelling is up and down, overall controlled with lasix  7. DM2 - followed by pcp   8. Claudication/PAD - followed by vascular  9. Dysphagia - episode of food getting stuck, reports prior issues along with esophageal dilatation in the past. 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    Past Medical History:  Diagnosis Date  . Abnormal cardiovascular stress test 03/2013  . Anxiety   . Arthritis   . Back pain   . CHF (congestive heart failure) ([place])   . Coronary artery disease    Multivessel with significant LAD involvement by  chest CT 2014  . Depression   . Diastolic dysfunction   . Essential hypertension   . Lumbar herniated disc    L4-L5  . MRSA (methicillin resistant staph aureus) culture positive   . Shortness of breath dyspnea   . Sleep apnea    No cpap  . Type 2 diabetes mellitus ([place])    Past Surgical History:  Procedure Laterality Date  . ANTERIOR CERVICAL DECOMP/DISCECTOMY FUSION  01/18/2011   Procedure: ANTERIOR CERVICAL DECOMPRESSION/DISCECTOMY  FUSION 2 LEVELS;  Surgeon: Cooper Render Pool;  Location: [place] [facility]UR[facility] ORS;  Service: Neurosurgery;  Laterality: N/A;  anterior cervical discectomy and fusion with allograft and plating, cervical five-six, cervical six-seven  . BIOPSY N/A 04/04/2015   Procedure: BIOPSY;  Surgeon: Rogene Houston, MD;  [facility];  Service: Endoscopy;  Laterality: N/A;  . Bone spur removed  1997   Left shoulder  . CARDIAC CATHETERIZATION N/A 08/08/2015   Procedure: Right/Left Heart Cath and Coronary Angiography;  Surgeon: Sherren Mocha, MD;  Location: [place] CV LAB;  Service: Cardiovascular;  Laterality: N/A;  . CARPAL TUNNEL RELEASE Right   . ESOPHAGEAL DILATION N/A 04/04/2015   Procedure: ESOPHAGEAL DILATION;  Surgeon: Rogene Houston, MD;  [facility];  Service: Endoscopy;  Laterality: N/A;  . ESOPHAGOGASTRODUODENOSCOPY N/A 04/04/2015   Procedure: ESOPHAGOGASTRODUODENOSCOPY (EGD);  Surgeon: Rogene Houston, MD;  [facility];  Service: Endoscopy;  Laterality: N/A;  1240  . HEMORRHOID SURGERY    . TONSILLECTOMY       No outpatient medications have been marked as taking for the 07/18/19 encounter (Appointment) with Arnoldo Lenis, MD.     Allergies:   Morphine and related   Social History   Tobacco Use  . Smoking status: Current Every Day Smoker    Packs/day: 1.00    Years: 50.00    Pack years: 50.00    Types: Cigarettes  . Smokeless tobacco: Former Systems developer  . Tobacco comment: smoking x 50 yrs  Substance Use Topics  . Alcohol use: Yes    Alcohol/week: 1.0 standard drinks    Types: 1 Standard drinks or equivalent per week    Comment: rare  . Drug use: Yes    Frequency: 1.0 times per week    Types: Marijuana    Comment: as needed     Family Hx: The patient's family history includes Cushing syndrome in his sister; Diabetes in his father and sister; Hepatitis B in his father; Lung cancer in his mother.  ROS:   Please see the history of present illness.    All  other systems reviewed and are negative.   Prior CV studies:   The following studies were reviewed today:  Jan 2015 MPI IMPRESSION: 1. Abnormal exercise myocardial perfusion imaging stress test  2. Inferior wall defect likely due to subdiaphragmatic attenuation, its wall motion is similar to other areas without a perfusion defect.  3. Likely small anteroseptal infract, this area is more hypokinetic than the rest of the myocardium  3. Low left ventricular systolic function with global hypokinesis  4. Mildly reduced functional capacity (90% of age and gender predicted)  5. Overall increased risk for major cardiac events based on low ejection fraction. There is no current myocardium at jeopardy.   02/2013 echo Study Conclusions  - Left ventricle: The cavity size was normal. There was mild concentric hypertrophy. Systolic function was normal. The estimated ejection fraction was in the range of 55% to 60%. Wall motion  was normal; there were no regional wall motion abnormalities. There is mildly asynchronous contraction consistent with intraventricular conduction delay (IVCD) or bundle Ariyel Jeangilles block. Doppler parameters are consistent with abnormal left ventricular relaxation (grade 1 diastolic dysfunction). - Aortic valve: Cusp separation was mildly reduced. There was mild to moderate stenosis. - Left atrium: The atrium was moderately dilated.  07/2015 echo  Study Conclusions  - Left ventricle: The cavity size was normal. Wall thickness was  increased in a pattern of mild LVH. Systolic function was mildly  reduced. The estimated ejection fraction was in the range of 45%  to 50%. Diffuse hypokinesis. Doppler parameters are consistent  with restrictive physiology, indicative of decreased left  ventricular diastolic compliance and/or increased left atrial  pressure. Doppler parameters are consistent with high ventricular  filling  pressure. - Aortic valve: Valve mobility was restricted. There was mild to  moderate stenosis. Valve area (VTI): 0.96 cm^2. Valve area  (Vmax): 0.91 cm^2. Valve area (Vmean): 0.89 cm^2. - Mitral valve: Calcified annulus. There was mild regurgitation. - Left atrium: The atrium was moderately dilated. - Right atrium: The atrium was mildly dilated. - Atrial septum: There was a patent foramen ovale.  Impressions:  - Mild global reduction in LV function; restrictive filling with  elevated filling pressure; mild LVH; biatrial enlargement;  heavily calcified aortic valve with probable mild to moderate AS  (mean gradient 16 mmHg; calculated AVA .9 cm2 likely  overestimates severity; dimensionless index .3 not supportive of  severe AS); mild MR; patent foramen ovale.  07/2015 Cath  Prox LAD to Mid LAD lesion, 50% stenosed.  1. Calcified moderate proximal-mid LAD stenosis 2. Widely patent, dominant LCx 3. Preserved cardiac output, mildly elevated right-sided intracardiac pressures  Recommend: Medical therapy for nonobstructive CAD and CHF  01/2016 AAA [place] Screen No aneurysm  Labs/Other Tests and Data Reviewed:    EKG:  No ECG reviewed.  Recent Labs: 11/14/2018: ALT 16; TSH 1.96 06/23/2019: BUN 12; Creatinine, Ser 0.53; Hemoglobin 14.0; Platelets 206; Potassium 3.6; Sodium 137   Recent Lipid Panel Lab Results  Component Value Date/Time   CHOL 82 11/14/2018 10:38 AM   TRIG 137 11/14/2018 10:38 AM   HDL 25 (L) 11/14/2018 10:38 AM   CHOLHDL 3.3 11/14/2018 10:38 AM   LDLCALC 35 11/14/2018 10:38 AM    Wt Readings from Last 3 Encounters:  06/23/19 208 lb 3.2 oz (94.4 kg)  06/19/19 208 lb 3.2 oz (94.4 kg)  03/19/19 201 lb (91.2 kg)     Objective:    Vital Signs:   Today's Vitals   07/18/19 1112  Weight: 200 lb (90.7 kg)  Height: 5\' 7"  (1.702 m)   Body mass index is 31.32 kg/m. NOrmal affect. Normal speech pattern and tone. Comfortable, no apparent distress. No  audible signs of sob or wheezing.   ASSESSMENT & PLAN:    1. CAD - moderate nonobstructive disease bypriorcath. -no recent symptoms, continue current meds   2. Hyperlipidemia - LDL at goal, continue statin   3. Aortic stenosis - mild to moderate by most recent echo -continue to monitor, repeat echo 2022  4. HTN -at goal, continue current meds  5. Chronic diasotlic heart failure -controlled, continue lasix  6. Dysphagia - reports some recurrent issues, had prior esophageal diltation. Also reports due for colonscopy. Refer back to Dr Laural Golden  COVID-19 Education: The signs and symptoms of COVID-19 were discussed with the patient and how to seek care for testing (follow up with PCP or arrange E-visit).  The importance of social distancing was discussed today.  Time:   Today, I have spent 20 minutes with the patient with telehealth technology discussing the above problems.     Medication Adjustments/Labs and Tests Ordered: Current medicines are reviewed at length with the patient today.  Concerns regarding medicines are outlined above.   Tests Ordered: No orders of the defined types were placed in this encounter.   Medication Changes: No orders of the defined types were placed in this encounter.   Follow Up:  Either In Person or Virtual in 6 month(s)  Signed, Jonathan Dolly, MD  07/18/2019 8:49 AM    [place]

## 2019-07-25 ENCOUNTER — Encounter (INDEPENDENT_AMBULATORY_CARE_PROVIDER_SITE_OTHER): Payer: Self-pay | Admitting: Gastroenterology

## 2019-08-15 ENCOUNTER — Ambulatory Visit (INDEPENDENT_AMBULATORY_CARE_PROVIDER_SITE_OTHER): Payer: Medicare HMO | Admitting: Internal Medicine

## 2019-08-15 ENCOUNTER — Encounter (INDEPENDENT_AMBULATORY_CARE_PROVIDER_SITE_OTHER): Payer: Self-pay | Admitting: Internal Medicine

## 2019-08-15 ENCOUNTER — Other Ambulatory Visit: Payer: Self-pay

## 2019-08-15 VITALS — BP 120/70 | HR 69 | Temp 97.5°F | Ht 67.0 in | Wt 198.4 lb

## 2019-08-15 DIAGNOSIS — E291 Testicular hypofunction: Secondary | ICD-10-CM | POA: Diagnosis not present

## 2019-08-15 DIAGNOSIS — R5381 Other malaise: Secondary | ICD-10-CM | POA: Diagnosis not present

## 2019-08-15 DIAGNOSIS — I10 Essential (primary) hypertension: Secondary | ICD-10-CM

## 2019-08-15 DIAGNOSIS — R5383 Other fatigue: Secondary | ICD-10-CM

## 2019-08-15 DIAGNOSIS — E1165 Type 2 diabetes mellitus with hyperglycemia: Secondary | ICD-10-CM

## 2019-08-15 DIAGNOSIS — E559 Vitamin D deficiency, unspecified: Secondary | ICD-10-CM

## 2019-08-15 NOTE — Progress Notes (Signed)
Metrics: Intervention Frequency ACO  Documented Smoking Status Yearly  Screened one or more times in 24 months  Cessation Counseling or  Active cessation medication Past 24 months  Past 24 months   Guideline developer: UpToDate (See UpToDate for funding source) Date Released: 2014       Wellness Office Visit  Subjective:  Patient ID: Jonathan Gilmore, male    DOB: Mar 24, 1950  Age: 68 y.o. MRN: [id number]  CC: This 69 year old man comes to our practice as a new patient to establish care. HPI  He has type 2 insulin-dependent diabetes and sees endocrinology, Dr. Dorris Gilmore.  He would like to get off insulin, does not like to take it. He also was taking testosterone therapy which was discontinued by his previous primary care physician.  He fell much energized and well on this medication.  His testosterone levels were checked previously and free testosterone levels were low.  This apparently was the morning testosterone level. Past Medical History:  Diagnosis Date  . Abnormal cardiovascular stress test 03/2013  . Anxiety   . Arthritis   . Back pain   . CHF (congestive heart failure) ([place])   . Coronary artery disease    Multivessel with significant LAD involvement by chest CT 2014  . Depression   . Diastolic dysfunction   . Essential hypertension   . Lumbar herniated disc    L4-L5  . MRSA (methicillin resistant staph aureus) culture positive   . Shortness of breath dyspnea   . Sleep apnea    No cpap  . Type 2 diabetes mellitus ([place])    Past Surgical History:  Procedure Laterality Date  . ANTERIOR CERVICAL DECOMP/DISCECTOMY FUSION  01/18/2011   Procedure: ANTERIOR CERVICAL DECOMPRESSION/DISCECTOMY FUSION 2 LEVELS;  Surgeon: Jonathan Gilmore;  Location: [place] NEURO ORS;  Service: Neurosurgery;  Laterality: N/A;  anterior cervical discectomy and fusion with allograft and plating, cervical five-six, cervical six-seven  . BIOPSY N/A 04/04/2015   Procedure: BIOPSY;  Surgeon: Jonathan Houston, MD;   [facility];  Service: Endoscopy;  Laterality: N/A;  . Bone spur removed  1997   Left shoulder  . CARDIAC CATHETERIZATION N/A 08/08/2015   Procedure: Right/Left Heart Cath and Coronary Angiography;  Surgeon: Jonathan Mocha, MD;  Location: [place] CV LAB;  Service: Cardiovascular;  Laterality: N/A;  . CARPAL TUNNEL RELEASE Right   . ESOPHAGEAL DILATION N/A 04/04/2015   Procedure: ESOPHAGEAL DILATION;  Surgeon: Jonathan Houston, MD;  [facility];  Service: Endoscopy;  Laterality: N/A;  . ESOPHAGOGASTRODUODENOSCOPY N/A 04/04/2015   Procedure: ESOPHAGOGASTRODUODENOSCOPY (EGD);  Surgeon: Jonathan Houston, MD;  [facility];  Service: Endoscopy;  Laterality: N/A;  1240  . HEMORRHOID SURGERY    . TONSILLECTOMY       Family History  Problem Relation Age of Onset  . Lung cancer Mother   . Diabetes Father   . Hepatitis B Father   . Diabetes Sister   . Cushing syndrome Sister     Social History   Social History Narrative   Married for 5 years.Retired Living with wife.   Social History   Tobacco Use  . Smoking status: Current Every Day Smoker    Packs/day: 1.00    Years: 50.00    Pack years: 50.00    Types: Cigarettes  . Smokeless tobacco: Former Systems developer  . Tobacco comment: smoking x 50 yrs  Substance Use Topics  . Alcohol use: Yes    Alcohol/week: 1.0 standard drinks  Types: 1 Standard drinks or equivalent per week    Comment: rare    Current Meds  Medication Sig  . ACCU-CHEK AVIVA PLUS test strip USE AS INSTRUCTED  . aspirin EC 81 MG tablet Take 81 mg by mouth daily.  Marland Kitchen atorvastatin (LIPITOR) 80 MG tablet TAKE 1 TABLET BY MOUTH EVERYDAY AT BEDTIME  . CALCIUM-MAGNESIUM-ZINC PO Take 1 tablet by mouth daily.  . Flaxseed, Linseed, (FLAXSEED OIL PO) Take 1 tablet by mouth daily.   . furosemide (LASIX) 40 MG tablet TAKE 1 TABLET BY MOUTH EVERY DAY  . gabapentin (NEURONTIN) 300 MG capsule Take 1 capsule (300 mg total) by mouth 2 (two) times daily.  (Patient taking differently: Take 300 mg by mouth at bedtime. 2 TABLETS AT BEDTIME)  . ibuprofen (ADVIL,MOTRIN) 200 MG tablet Take 800 mg by mouth daily as needed for moderate pain.  Marland Kitchen LANTUS SOLOSTAR 100 UNIT/ML Solostar Pen INJECT 60 UNITS INTO THE SKIN AT BEDTIME. (Patient taking differently: Inject 40 Units into the skin at bedtime. )  . metFORMIN (GLUCOPHAGE) 500 MG tablet TAKE 1 TABLET TWICE A DAY  . metoprolol succinate (TOPROL-XL) 25 MG 24 hr tablet Take 1 tablet (25 mg total) by mouth at bedtime.  . Multiple Vitamin (MULTIVITAMIN WITH MINERALS) TABS Take 1 tablet by mouth daily.  . nitroGLYCERIN (NITROSTAT) 0.4 MG SL tablet Place 1 tablet (0.4 mg total) under the tongue every 5 (five) minutes as needed for chest pain.       Depression screen [facility] 2/9 11/29/2018 10/25/2018  Decreased Interest 0 0  Down, Depressed, Hopeless 0 0  PHQ - 2 Score 0 0     Objective:   Today's Vitals: BP 120/70 (BP Location: Left Arm, Patient Position: Sitting, Cuff Size: Normal)   Pulse 69   Temp (!) 97.5 F (36.4 C) (Temporal)   Ht 5\' 7"  (1.702 m)   Wt 198 lb 6.4 oz (90 kg)   SpO2 95%   BMI 31.07 kg/m  Vitals with BMI 08/15/2019 07/18/2019 06/23/2019  Height 5\' 7"  5\' 7"  5\' 7"   Weight 198 lbs 6 oz 200 lbs 208 lbs 3 oz  BMI 31.07 [id number]  Systolic 123456 - 0000000  Diastolic 70 - 64  Pulse 69 - 59     Physical Exam  He looks older than his stated age.  He is obese.  Blood pressure is well controlled.  Alert and orientated without any focal neurological signs.     Assessment   1. Hypogonadism, male   2. Essential hypertension, benign   3. Uncontrolled type 2 diabetes mellitus with hyperglycemia ([place])   4. Malaise and fatigue   5. Vitamin D deficiency disease       Tests ordered Orders Placed This Encounter  Procedures  . Testosterone Total,Free,Bio, Males  . Cardio IQ Adv Lipid and Inflamm Pnl  . COMPLETE METABOLIC PANEL WITH GFR  . VITAMIN D 25 Hydroxy (Vit-D Deficiency, Fractures)      Plan: 1. I will repeat his testosterone levels today.  Although this is an afternoon testosterone level, he will give [place] some good idea of where his levels are.  Besides, he is very symptomatic at this point in time. 2. His hypertension is well controlled with current medication of metoprolol and he will continue with this. 3. For the time being, he will continue with Metformin and insulin for his diabetes. 4. I will see him in about 3 weeks time to discuss all his results and further recommendations.  No orders of the defined types were placed in this encounter.   Doree Albee, MD

## 2019-08-18 LAB — TESTOSTERONE TOTAL,FREE,BIO, MALES
Albumin: 3.7 g/dL (ref 3.6–5.1)
Sex Hormone Binding: 50 nmol/L (ref 22–77)
Testosterone: 153 ng/dL — ABNORMAL LOW (ref 250–827)

## 2019-08-18 LAB — CARDIO IQ ADV LIPID AND INFLAMM PNL
Apolipoprotein B: 61 mg/dL (ref <90)
Cholesterol: 106 mg/dL (ref <200)
HDL: 37 mg/dL — ABNORMAL LOW (ref >39)
LDL Cholesterol (Calc): 51 mg/dL (calc) (ref <100)
LDL Large: 5581 nmol/L — ABNORMAL LOW (ref >6729)
LDL Medium: 199 nmol/L (ref <215)
LDL Particle Number: 1079 nmol/L (ref <1138)
LDL Peak Size: 215 Angstrom — ABNORMAL LOW (ref >222.9)
LDL Small: 195 nmol/L — ABNORMAL HIGH (ref <142)
Lipoprotein (a): 28 nmol/L (ref <75)
Non-HDL Cholesterol (Calc): 69 mg/dL (calc) (ref <130)
PLAC: 85 nmol/min/mL (ref <124)
Total CHOL/HDL Ratio: 2.9 calc (ref <3.6)
Triglycerides: 100 mg/dL (ref <150)
hs-CRP: >10 mg/L — ABNORMAL HIGH (ref <1.0)

## 2019-08-18 LAB — COMPLETE METABOLIC PANEL WITH GFR
AG Ratio: 1.4 (calc) (ref 1.0–2.5)
ALT: 13 U/L (ref 9–46)
AST: 21 U/L (ref 10–35)
Albumin: 3.7 g/dL (ref 3.6–5.1)
Alkaline phosphatase (APISO): 78 U/L (ref 35–144)
BUN: 17 mg/dL (ref 7–25)
CO2: 28 mmol/L (ref 20–32)
Calcium: 9.1 mg/dL (ref 8.6–10.3)
Chloride: 99 mmol/L (ref 98–110)
Creat: 0.79 mg/dL (ref 0.70–1.25)
GFR, Est African American: 107 mL/min/{1.73_m2} (ref >=60)
GFR, Est Non African American: 92 mL/min/{1.73_m2} (ref >=60)
Globulin: 2.6 g/dL (calc) (ref 1.9–3.7)
Glucose, Bld: 133 mg/dL — ABNORMAL HIGH (ref 65–99)
Potassium: 4.3 mmol/L (ref 3.5–5.3)
Sodium: 135 mmol/L (ref 135–146)
Total Bilirubin: 0.6 mg/dL (ref 0.2–1.2)
Total Protein: 6.3 g/dL (ref 6.1–8.1)

## 2019-08-18 LAB — VITAMIN D 25 HYDROXY (VIT D DEFICIENCY, FRACTURES): Vit D, 25-Hydroxy: 33 ng/mL (ref 30–100)

## 2019-08-27 ENCOUNTER — Other Ambulatory Visit: Payer: Self-pay | Admitting: "Endocrinology

## 2019-09-04 ENCOUNTER — Ambulatory Visit (INDEPENDENT_AMBULATORY_CARE_PROVIDER_SITE_OTHER): Payer: Medicare HMO | Admitting: Internal Medicine

## 2019-09-04 ENCOUNTER — Other Ambulatory Visit: Payer: Self-pay

## 2019-09-04 ENCOUNTER — Encounter (INDEPENDENT_AMBULATORY_CARE_PROVIDER_SITE_OTHER): Payer: Self-pay | Admitting: Internal Medicine

## 2019-09-04 VITALS — BP 132/66 | HR 58 | Temp 97.3°F | Resp 18 | Ht 67.0 in | Wt 199.2 lb

## 2019-09-04 DIAGNOSIS — E1165 Type 2 diabetes mellitus with hyperglycemia: Secondary | ICD-10-CM

## 2019-09-04 DIAGNOSIS — E291 Testicular hypofunction: Secondary | ICD-10-CM

## 2019-09-04 DIAGNOSIS — E559 Vitamin D deficiency, unspecified: Secondary | ICD-10-CM | POA: Diagnosis not present

## 2019-09-04 MED ORDER — TESTOSTERONE CYPIONATE 200 MG/ML IM SOLN: 100.0000 mg | INTRAMUSCULAR | 0 refills | Status: DC

## 2019-09-04 NOTE — Progress Notes (Signed)
Metrics: Intervention Frequency ACO  Documented Smoking Status Yearly  Screened one or more times in 24 months  Cessation Counseling or  Active cessation medication Past 24 months  Past 24 months   Guideline developer: UpToDate (See UpToDate for funding source) Date Released: 2014       Wellness Office Visit  Subjective:  Patient ID: Jonathan Gilmore, male    DOB: 08/19/1950  Age: 69 y.o. MRN: [id number]  CC: This man comes in for follow-up regarding his testosterone deficiency and diabetes as well as to discuss his blood work. HPI  He has suboptimal vitamin D levels.  His testosterone levels are extremely low even though it was an afternoon blood test. His lipid IQ panel does put him at high risk for further events regarding coronary artery disease. From our previous discussion, he has taken upon himself to discontinue insulin altogether and surprisingly his blood glucose levels have not been significantly elevated since this time. Past Medical History:  Diagnosis Date  . Abnormal cardiovascular stress test 03/2013  . Anxiety   . Arthritis   . Back pain   . CHF (congestive heart failure) ([place])   . Coronary artery disease    Multivessel with significant LAD involvement by chest CT 2014  . Depression   . Diastolic dysfunction   . Essential hypertension   . Lumbar herniated disc    L4-L5  . MRSA (methicillin resistant staph aureus) culture positive   . Shortness of breath dyspnea   . Sleep apnea    No cpap  . Type 2 diabetes mellitus ([place])    Past Surgical History:  Procedure Laterality Date  . ANTERIOR CERVICAL DECOMP/DISCECTOMY FUSION  01/18/2011   Procedure: ANTERIOR CERVICAL DECOMPRESSION/DISCECTOMY FUSION 2 LEVELS;  Surgeon: Cooper Render Pool;  Location: [place] NEURO ORS;  Service: Neurosurgery;  Laterality: N/A;  anterior cervical discectomy and fusion with allograft and plating, cervical five-six, cervical six-seven  . BIOPSY N/A 04/04/2015   Procedure: BIOPSY;  Surgeon: Rogene Houston, MD;  [facility];  Service: Endoscopy;  Laterality: N/A;  . Bone spur removed  1997   Left shoulder  . CARDIAC CATHETERIZATION N/A 08/08/2015   Procedure: Right/Left Heart Cath and Coronary Angiography;  Surgeon: Sherren Mocha, MD;  Location: [place] CV LAB;  Service: Cardiovascular;  Laterality: N/A;  . CARPAL TUNNEL RELEASE Right   . ESOPHAGEAL DILATION N/A 04/04/2015   Procedure: ESOPHAGEAL DILATION;  Surgeon: Rogene Houston, MD;  [facility];  Service: Endoscopy;  Laterality: N/A;  . ESOPHAGOGASTRODUODENOSCOPY N/A 04/04/2015   Procedure: ESOPHAGOGASTRODUODENOSCOPY (EGD);  Surgeon: Rogene Houston, MD;  [facility];  Service: Endoscopy;  Laterality: N/A;  1240  . HEMORRHOID SURGERY    . TONSILLECTOMY       Family History  Problem Relation Age of Onset  . Lung cancer Mother   . Diabetes Father   . Hepatitis B Father   . Diabetes Sister   . Cushing syndrome Sister     Social History   Social History Narrative   Married for 5 years.Retired Living with wife.   Social History   Tobacco Use  . Smoking status: Current Every Day Smoker    Packs/day: 1.00    Years: 50.00    Pack years: 50.00    Types: Cigarettes  . Smokeless tobacco: Former Systems developer  . Tobacco comment: smoking x 50 yrs  Substance Use Topics  . Alcohol use: Yes    Alcohol/week: 1.0 standard drink  Types: 1 Standard drinks or equivalent per week    Comment: rare    Current Meds  Medication Sig  . ACCU-CHEK AVIVA PLUS test strip USE AS INSTRUCTED  . atorvastatin (LIPITOR) 80 MG tablet TAKE 1 TABLET BY MOUTH EVERYDAY AT BEDTIME  . CALCIUM-MAGNESIUM-ZINC PO Take 1 tablet by mouth daily.  . Flaxseed, Linseed, (FLAXSEED OIL PO) Take 1 tablet by mouth daily.   . furosemide (LASIX) 40 MG tablet TAKE 1 TABLET BY MOUTH EVERY DAY  . gabapentin (NEURONTIN) 300 MG capsule Take 1 capsule (300 mg total) by mouth 2 (two) times daily. (Patient taking differently: Take 300 mg  by mouth at bedtime. 2 TABLETS AT BEDTIME)  . ibuprofen (ADVIL,MOTRIN) 200 MG tablet Take 800 mg by mouth daily as needed for moderate pain.  . metFORMIN (GLUCOPHAGE) 500 MG tablet TAKE 1 TABLET TWICE A DAY  . metoprolol succinate (TOPROL-XL) 25 MG 24 hr tablet Take 1 tablet (25 mg total) by mouth at bedtime.  . Multiple Vitamin (MULTIVITAMIN WITH MINERALS) TABS Take 1 tablet by mouth daily.  . nitroGLYCERIN (NITROSTAT) 0.4 MG SL tablet Place 1 tablet (0.4 mg total) under the tongue every 5 (five) minutes as needed for chest pain.  . [DISCONTINUED] LANTUS SOLOSTAR 100 UNIT/ML Solostar Pen INJECT 60 UNITS INTO THE SKIN AT BEDTIME. (Patient taking differently: Inject 40 Units into the skin at bedtime. )       Depression screen [facility] 2/9 11/29/2018 10/25/2018  Decreased Interest 0 0  Down, Depressed, Hopeless 0 0  PHQ - 2 Score 0 0     Objective:   Today's Vitals: BP 132/66 (BP Location: Left Arm, Patient Position: Sitting, Cuff Size: Normal)   Pulse (!) 58   Temp (!) 97.3 F (36.3 C) (Temporal)   Resp 18   Ht 5\' 7"  (1.702 m)   Wt 199 lb 3.2 oz (90.4 kg)   SpO2 98%   BMI 31.20 kg/m  Vitals with BMI 09/04/2019 08/15/2019 07/18/2019  Height 5\' 7"  5\' 7"  5\' 7"   Weight 199 lbs 3 oz 198 lbs 6 oz 200 lbs  BMI 31.19 46.80 32.12  Systolic 248 250 -  Diastolic 66 70 -  Pulse 58 69 -     Physical Exam    He looks systemically well.  His weight is stable.  Blood pressure is well controlled.  No new other physical findings today.   Assessment   1. Hypogonadism, male   2. Uncontrolled type 2 diabetes mellitus with hyperglycemia ([place])   3. Vitamin D deficiency disease       Tests ordered No orders of the defined types were placed in this encounter.    Plan: 1. Regarding his testosterone deficiency and symptoms with it, I think it is reasonable that he go back on testosterone and I have sent in prescription regarding this.  He will have testosterone cypionate injections 100 mg  intramuscular once a week.  He is aware of possible side effects previously. 2. I recommended he start taking vitamin D3 10,000 units daily. 3. I will see him in about 4 to 6 weeks time for close follow-up and we will see how his diabetes is doing at that point.  I have also given him a diet sheet and I have discussed this with him as well as his wife previously.   Meds ordered this encounter  Medications  . testosterone cypionate (DEPO-TESTOSTERONE) 200 MG/ML injection    Sig: Inject 0.5 mLs (100 mg total) into the muscle every  7 (seven) days.    Dispense:  10 mL    Refill:  0    Kadeisha Betsch Luther Parody, MD

## 2019-09-04 NOTE — Patient Instructions (Signed)
[facility] Dietary Recommendations for Weight Loss What to Avoid . Avoid added sugars o Often added sugar can be found in processed foods such as many condiments, dry cereals, cakes, cookies, chips, crisps, crackers, candies, sweetened drinks, etc.  o Read labels and AVOID/DECREASE use of foods with the following in their ingredient list: Sugar, fructose, high fructose corn syrup, sucrose, glucose, maltose, dextrose, molasses, cane sugar, brown sugar, any type of syrup, agave nectar, etc.   . Avoid snacking in between meals . Avoid foods made with flour o If you are going to eat food made with flour, choose those made with whole-grains; and, minimize your consumption as much as is tolerable . Avoid processed foods o These foods are generally stocked in the middle of the grocery store. Focus on shopping on the perimeter of the grocery.  . Avoid Meat  o We recommend following a plant-based diet at [facility]. Thus, we recommend avoiding meat as a general rule. Consider eating beans, legumes, eggs, and/or dairy products for regular protein sources o If you plan on eating meat limit to 4 ounces of meat at a time and choose lean options such as Fish, chicken, turkey. Avoid red meat intake such as pork and/or steak What to Include . Vegetables o GREEN LEAFY VEGETABLES: Kale, spinach, mustard greens, collard greens, cabbage, broccoli, etc. o OTHER: Asparagus, cauliflower, eggplant, carrots, peas, Brussel sprouts, tomatoes, bell peppers, zucchini, beets, cucumbers, etc. . Grains, seeds, and legumes o Beans: kidney beans, black eyed peas, garbanzo beans, black beans, pinto beans, etc. o Whole, unrefined grains: brown rice, barley, bulgur, oatmeal, etc. . Healthy fats  o Avoid highly processed fats such as vegetable oil o Examples of healthy fats: avocado, olives, virgin olive oil, dark chocolate (?72% Cocoa), nuts (peanuts, almonds, walnuts, cashews, pecans, etc.) . None to Low  Intake of Animal Sources of Protein o Meat sources: chicken, turkey, salmon, tuna. Limit to 4 ounces of meat at one time. o Consider limiting dairy sources, but when choosing dairy focus on: PLAIN Greek yogurt, cottage cheese, high-protein milk . Fruit o Choose berries  When to Eat . Intermittent Fasting: o Choosing not to eat for a specific time period, but DO FOCUS ON HYDRATION when fasting o Multiple Techniques: - Time Restricted Eating: eat 3 meals in a day, each meal lasting no more than 60 minutes, no snacks between meals - 16-18 hour fast: fast for 16 to 18 hours up to 7 days a week. Often suggested to start with 2-3 nonconsecutive days per week.  . Remember the time you sleep is counted as fasting.  . Examples of eating schedule: Fast from 7:00pm-11:00am. Eat between 11:00am-7:00pm.  - 24-hour fast: fast for 24 hours up to every other day. Often suggested to start with 1 day per week . Remember the time you sleep is counted as fasting . Examples of eating schedule:  o Eating day: eat 2-3 meals on your eating day. If doing 2 meals, each meal should last no more than 90 minutes. If doing 3 meals, each meal should last no more than 60 minutes. Finish last meal by 7:00pm. o Fasting day: Fast until 7:00pm.  o IF YOU FEEL UNWELL FOR ANY REASON/IN ANY WAY WHEN FASTING, STOP FASTING BY EATING A NUTRITIOUS SNACK OR LIGHT MEAL o ALWAYS FOCUS ON HYDRATION DURING FASTS - Acceptable Hydration sources: water, broths, tea/coffee (black tea/coffee is best but using a small amount of whole-fat dairy products in coffee/tea is acceptable).  -   Poor Hydration Sources: anything with sugar or artificial sweeteners added to it  These recommendations have been developed for patients that are actively receiving medical care from either Dr. Angline Schweigert or Sarah Gray, DNP, NP-C at [facility]. These recommendations are developed for patients with specific medical conditions and are not meant to be  distributed or used by others that are not actively receiving care from either provider listed above at [facility]. It is not appropriate to participate in the above eating plans without proper medical supervision.   Reference: Fung, J. The obesity code. [place]: [place]; 2016.   

## 2019-09-20 ENCOUNTER — Other Ambulatory Visit: Payer: Self-pay | Admitting: Cardiology

## 2019-09-20 MED ORDER — LISINOPRIL 10 MG PO TABS: 10.0000 mg | ORAL_TABLET | Freq: Every day | ORAL | 3 refills | Status: DC

## 2019-09-20 NOTE — Telephone Encounter (Signed)
Refill complete 

## 2019-09-20 NOTE — Telephone Encounter (Signed)
Please call lisinopril to [facility]   If needed call [phone]   Thanks renee

## 2019-09-21 ENCOUNTER — Ambulatory Visit: Payer: Medicare HMO | Admitting: "Endocrinology

## 2019-09-28 ENCOUNTER — Ambulatory Visit: Payer: Medicare HMO | Admitting: "Endocrinology

## 2019-10-02 ENCOUNTER — Other Ambulatory Visit: Payer: Self-pay

## 2019-10-02 ENCOUNTER — Encounter (INDEPENDENT_AMBULATORY_CARE_PROVIDER_SITE_OTHER): Payer: Self-pay | Admitting: Internal Medicine

## 2019-10-02 ENCOUNTER — Ambulatory Visit (INDEPENDENT_AMBULATORY_CARE_PROVIDER_SITE_OTHER): Payer: Medicare HMO | Admitting: Internal Medicine

## 2019-10-02 VITALS — BP 128/88 | HR 81 | Temp 97.5°F | Resp 18 | Ht 66.0 in | Wt 194.2 lb

## 2019-10-02 DIAGNOSIS — E291 Testicular hypofunction: Secondary | ICD-10-CM

## 2019-10-02 DIAGNOSIS — E1165 Type 2 diabetes mellitus with hyperglycemia: Secondary | ICD-10-CM | POA: Diagnosis not present

## 2019-10-02 DIAGNOSIS — I10 Essential (primary) hypertension: Secondary | ICD-10-CM

## 2019-10-02 DIAGNOSIS — E559 Vitamin D deficiency, unspecified: Secondary | ICD-10-CM

## 2019-10-02 MED ORDER — TADALAFIL 10 MG PO TABS: 10.0000 mg | ORAL_TABLET | ORAL | 3 refills | Status: DC | PRN

## 2019-10-02 NOTE — Progress Notes (Signed)
Metrics: Intervention Frequency ACO  Documented Smoking Status Yearly  Screened one or more times in 24 months  Cessation Counseling or  Active cessation medication Past 24 months  Past 24 months   Guideline developer: UpToDate (See UpToDate for funding source) Date Released: 2014       Wellness Office Visit  Subjective:  Patient ID: Jonathan Gilmore, male    DOB: Jan 26, 1951  Age: 69 y.o. MRN: [id number]  CC: This man comes in for follow-up of hypertension, vitamin D deficiency, diabetes, hypogonadism. HPI  He has been on testosterone therapy for approximately 6 weeks and he does feel more energized.  He wishes a prescription for Cialis or Viagra for erectile dysfunction. He continues on metoprolol for his hypertension and also continues on atorvastatin for his hyperlipidemia in the face of diabetes. He continues on Metformin for his diabetes.  He has been trying to eat healthier along with his wife. Past Medical History:  Diagnosis Date  . Abnormal cardiovascular stress test 03/2013  . Anxiety   . Arthritis   . Back pain   . CHF (congestive heart failure) ([place])   . Coronary artery disease    Multivessel with significant LAD involvement by chest CT 2014  . Depression   . Diastolic dysfunction   . Essential hypertension   . Lumbar herniated disc    L4-L5  . MRSA (methicillin resistant staph aureus) culture positive   . Shortness of breath dyspnea   . Sleep apnea    No cpap  . Type 2 diabetes mellitus ([place])    Past Surgical History:  Procedure Laterality Date  . ANTERIOR CERVICAL DECOMP/DISCECTOMY FUSION  01/18/2011   Procedure: ANTERIOR CERVICAL DECOMPRESSION/DISCECTOMY FUSION 2 LEVELS;  Surgeon: Cooper Render Pool;  Location: [place] [facility];  Service: Neurosurgery;  Laterality: N/A;  anterior cervical discectomy and fusion with allograft and plating, cervical five-six, cervical six-seven  . BIOPSY N/A 04/04/2015   Procedure: BIOPSY;  Surgeon: Rogene Houston, MD;  [facility];  Service: Endoscopy;  Laterality: N/A;  . Bone spur removed  1997   Left shoulder  . CARDIAC CATHETERIZATION N/A 08/08/2015   Procedure: Right/Left Heart Cath and Coronary Angiography;  Surgeon: Sherren Mocha, MD;  Location: [place] [facility];  Service: Cardiovascular;  Laterality: N/A;  . CARPAL TUNNEL RELEASE Right   . ESOPHAGEAL DILATION N/A 04/04/2015   Procedure: ESOPHAGEAL DILATION;  Surgeon: Rogene Houston, MD;  [facility];  Service: Endoscopy;  Laterality: N/A;  . ESOPHAGOGASTRODUODENOSCOPY N/A 04/04/2015   Procedure: ESOPHAGOGASTRODUODENOSCOPY (EGD);  Surgeon: Rogene Houston, MD;  [facility];  Service: Endoscopy;  Laterality: N/A;  1240  . HEMORRHOID SURGERY    . TONSILLECTOMY       Family History  Problem Relation Age of Onset  . Lung cancer Mother   . Diabetes Father   . Hepatitis B Father   . Diabetes Sister   . Cushing syndrome Sister     Social History   Social History Narrative   Married for 5 years.Retired Living with wife.   Social History   Tobacco Use  . Smoking status: Current Every Day Smoker    Packs/day: 1.00    Years: 50.00    Pack years: 50.00    Types: Cigarettes  . Smokeless tobacco: Former Systems developer  . Tobacco comment: smoking x 50 yrs  Substance Use Topics  . Alcohol use: Yes    Alcohol/week: 1.0 standard drink    Types: 1 Standard  drinks or equivalent per week    Comment: rare    Current Meds  Medication Sig  . ACCU-CHEK AVIVA PLUS test strip USE AS INSTRUCTED  . aspirin EC 81 MG tablet Take 81 mg by mouth daily.  Marland Kitchen atorvastatin (LIPITOR) 80 MG tablet TAKE 1 TABLET BY MOUTH EVERYDAY AT BEDTIME  . CALCIUM-MAGNESIUM-ZINC PO Take 1 tablet by mouth daily.  . Flaxseed, Linseed, (FLAXSEED OIL PO) Take 1 tablet by mouth daily.   . furosemide (LASIX) 40 MG tablet TAKE 1 TABLET BY MOUTH EVERY DAY  . gabapentin (NEURONTIN) 300 MG capsule Take 1 capsule (300 mg total) by mouth 2 (two) times daily. (Patient taking  differently: Take 300 mg by mouth at bedtime. 2 TABLETS AT BEDTIME)  . ibuprofen (ADVIL,MOTRIN) 200 MG tablet Take 800 mg by mouth daily as needed for moderate pain.  Marland Kitchen lisinopril (ZESTRIL) 10 MG tablet Take 1 tablet (10 mg total) by mouth daily.  . metFORMIN (GLUCOPHAGE) 500 MG tablet TAKE 1 TABLET TWICE A DAY  . metoprolol succinate (TOPROL-XL) 25 MG 24 hr tablet Take 1 tablet (25 mg total) by mouth at bedtime.  . Multiple Vitamin (MULTIVITAMIN WITH MINERALS) TABS Take 1 tablet by mouth daily.  . nitroGLYCERIN (NITROSTAT) 0.4 MG SL tablet Place 1 tablet (0.4 mg total) under the tongue every 5 (five) minutes as needed for chest pain.  Marland Kitchen testosterone cypionate (DEPO-TESTOSTERONE) 200 MG/ML injection Inject 0.5 mLs (100 mg total) into the muscle every 7 (seven) days.      Depression screen [place] 2/9 11/29/2018 10/25/2018  Decreased Interest 0 0  Down, Depressed, Hopeless 0 0  PHQ - 2 Score 0 0     Objective:   Today's Vitals: BP 128/88 (BP Location: Right Arm, Patient Position: Sitting, Cuff Size: Normal)   Pulse 81   Temp (!) 97.5 F (36.4 C) (Temporal)   Resp 18   Ht 5\' 6"  (1.676 m)   Wt 194 lb 3.2 oz (88.1 kg)   SpO2 97% Comment: wearing mask  BMI 31.34 kg/m  Vitals with BMI 10/02/2019 09/04/2019 08/15/2019  Height 5\' 6"  5\' 7"  5\' 7"   Weight 194 lbs 3 oz 199 lbs 3 oz 198 lbs 6 oz  BMI 31.36 75.10 25.85  Systolic 277 824 235  Diastolic 88 66 70  Pulse 81 58 69     Physical Exam   He has lost 5 pounds since the last time he was seen.  Blood pressure reasonable.  No other new physical findings.    Assessment   1. Vitamin D deficiency disease   2. Hypogonadism, male   3. Essential hypertension, benign   4. Uncontrolled type 2 diabetes mellitus with hyperglycemia ([place])       Tests ordered Orders Placed This Encounter  Procedures  . Testosterone Total,Free,Bio, Males  . VITAMIN D 25 Hydroxy (Vit-D Deficiency, Fractures)  . COMPLETE METABOLIC PANEL WITH GFR  .  Hemoglobin A1c     Plan: 1. Blood work is ordered. 2. He will continue with vitamin D3 supplementation for vitamin D deficiency and we will check levels today. 3. He will continue with testosterone therapy and his last injection was yesterday and we will check testosterone levels today. 4. As far as his diabetes is concerned, I expected to be improved and he will continue on Metformin and we will check his A1c today. 5. Further recommendations will depend on blood results and I will see him in 3 months time for follow-up.   Meds ordered this  encounter  Medications  . tadalafil (CIALIS) 10 MG tablet    Sig: Take 1 tablet (10 mg total) by mouth every other day as needed for erectile dysfunction.    Dispense:  10 tablet    Refill:  3    Pheobe Sandiford Luther Parody, MD

## 2019-10-03 LAB — HEMOGLOBIN A1C
Hgb A1c MFr Bld: 7.1 % of total Hgb — ABNORMAL HIGH (ref <5.7)
Mean Plasma Glucose: 157 (calc)
eAG (mmol/L): 8.7 (calc)

## 2019-10-15 LAB — HM DIABETES EYE EXAM

## 2019-10-25 ENCOUNTER — Telehealth (INDEPENDENT_AMBULATORY_CARE_PROVIDER_SITE_OTHER): Payer: Self-pay | Admitting: *Deleted

## 2019-10-25 ENCOUNTER — Encounter (INDEPENDENT_AMBULATORY_CARE_PROVIDER_SITE_OTHER): Payer: Self-pay | Admitting: Gastroenterology

## 2019-10-25 ENCOUNTER — Encounter (INDEPENDENT_AMBULATORY_CARE_PROVIDER_SITE_OTHER): Payer: Self-pay | Admitting: *Deleted

## 2019-10-25 ENCOUNTER — Other Ambulatory Visit (INDEPENDENT_AMBULATORY_CARE_PROVIDER_SITE_OTHER): Payer: Self-pay | Admitting: *Deleted

## 2019-10-25 ENCOUNTER — Ambulatory Visit (INDEPENDENT_AMBULATORY_CARE_PROVIDER_SITE_OTHER): Payer: Medicare HMO | Admitting: Gastroenterology

## 2019-10-25 ENCOUNTER — Other Ambulatory Visit: Payer: Self-pay

## 2019-10-25 VITALS — BP 157/70 | HR 62 | Temp 97.9°F | Ht 67.0 in | Wt 195.6 lb

## 2019-10-25 DIAGNOSIS — Z86010 Personal history of colonic polyps: Secondary | ICD-10-CM | POA: Diagnosis not present

## 2019-10-25 DIAGNOSIS — R1310 Dysphagia, unspecified: Secondary | ICD-10-CM | POA: Diagnosis not present

## 2019-10-25 MED ORDER — PLENVU 140 G PO SOLR: 1.0000 | Freq: Once | ORAL | 0 refills | Status: AC

## 2019-10-25 NOTE — Progress Notes (Signed)
Patient profile: Jonathan Gilmore is a 69 y.o. male seen for evaluation of dysphagia and colonoscopy - last seen 2017 for dysphagia. PMHX of DM, CAD, OSA (does not use CPAP)  History of Present Illness: Jonathan Gilmore is seen today for evaluation. Reports typically having 2 BM/day. Cheese tends to cause constipation and seafood and beer cause loose stools, otherwise bowel habits are regular. Denies any abd pain, blood in stool.   Patient denies nausea, vomiting, GERD, epigastric pain and weight loss. Had one severe episode of dysphagia to steak a few months ago. Felt lodged in upper sternal area, went to ER but wait was long and reports steak eventually passed without intervention. Tries to eat meat slowly to decrease reoccurrence. No liquid or pill dysphagia.    Smokes 1 PPD. Rare NSAIDs less than once a week. 3-4 beers/month. Occasion marijuana 1x/month.   Wt Readings from Last 3 Encounters:  10/25/19 195 lb 9.6 oz (88.7 kg)  10/02/19 194 lb 3.2 oz (88.1 kg)  09/04/19 199 lb 3.2 oz (90.4 kg)     Last Colonoscopy: last colonoscopy 2013 - [facility] - per "several polyps"    Last Endoscopy: Impression: 2017 Fine nodularity to mucosa at mid-esophagus. No evidence of erosive esophagitis ring or stricture. Erosive antral gastritis. Esophagus dilated by passing 56 French Maloney dilator but no mucosal disruption noted. Multiple biopsies taken from esophageal mucosal nodules.  H. pylori serology is negative. Esophageal biopsy negative for EoE     Past Medical History:  Past Medical History:  Diagnosis Date   Abnormal cardiovascular stress test 03/2013   Anxiety    Arthritis    Back pain    CHF (congestive heart failure) (H[facility])    Coronary artery disease    Multivessel with significant LAD involvement by chest CT 2014   Depression    Diastolic dysfunction    Essential hypertension    Lumbar herniated disc    L4-L5   MRSA (methicillin resistant staph aureus) culture  positive    Shortness of breath dyspnea    Sleep apnea    No cpap   Type 2 diabetes mellitus ([place])     Problem List: Patient Active Problem List   Diagnosis Date Noted   Insomnia 03/12/2019   Pure hypercholesterolemia 11/29/2018   Hypogonadism, male 11/23/2018   Essential hypertension, benign 11/13/2018   High risk medication use 10/28/2018   Carotid stenosis, asymptomatic, bilateral 10/28/2018   Tobacco use 10/28/2018   Polyp of colon 10/28/2018   Lumbar disc herniation with radiculopathy 12/21/2017   Spinal stenosis of lumbar region without neurogenic claudication 12/21/2017   CAD -50% LAD  08/09/2015   Moderate aortic stenosis 08/09/2015   Acute pulmonary edema ([place]) 20/25/4270   Acute diastolic CHF (congestive heart failure) ([place]) 08/07/2015   Hypersomnia with sleep apnea, unspecified 09/20/2013   Syncope 09/19/2013   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   OSA (obstructive sleep apnea) -non compliant 09/19/2013   Narcolepsy and cataplexy 09/19/2013   Facial abscess 04/28/2013   Facial cellulitis 04/28/2013   Mixed hyperlipidemia 04/28/2013   Tobacco dependence 04/28/2013   Sepsis ([place]) 04/28/2013   Gout 62/37/6283   Diastolic CHF ([place]) 15/17/6160   Pleuritic pain 02/24/2013   Chest pain 02/24/2013   Murmur 02/24/2013   Pericarditis 02/24/2013   DM type 2 causing vascular disease ([place]) 02/24/2013   Essential hypertension 02/24/2013   Cervical spondylosis with myelopathy 01/19/2011    Past Surgical History: Past Surgical History:  Procedure Laterality Date  ANTERIOR CERVICAL DECOMP/DISCECTOMY FUSION  01/18/2011   Procedure: ANTERIOR CERVICAL DECOMPRESSION/DISCECTOMY FUSION 2 LEVELS;  Surgeon: Cooper Render Pool;  Location: [place];  Service: Neurosurgery;  Laterality: N/A;  anterior cervical discectomy and fusion with allograft and plating, cervical five-six, cervical six-seven   BIOPSY N/A 04/04/2015   Procedure: BIOPSY;   Surgeon: Rogene Houston, MD;  [facility];  Service: Endoscopy;  Laterality: N/A;   Bone spur removed  1997   Left shoulder   CARDIAC CATHETERIZATION N/A 08/08/2015   Procedure: Right/Left Heart Cath and Coronary Angiography;  Surgeon: Sherren Mocha, MD;  Location: [facility];  Service: Cardiovascular;  Laterality: N/A;   CARPAL TUNNEL RELEASE Right    ESOPHAGEAL DILATION N/A 04/04/2015   Procedure: ESOPHAGEAL DILATION;  Surgeon: Rogene Houston, MD;  [facility];  Service: Endoscopy;  Laterality: N/A;   ESOPHAGOGASTRODUODENOSCOPY N/A 04/04/2015   Procedure: ESOPHAGOGASTRODUODENOSCOPY (EGD);  Surgeon: Rogene Houston, MD;  [facility];  Service: Endoscopy;  Laterality: N/A;  1240   HEMORRHOID SURGERY     TONSILLECTOMY      Allergies: Allergies  Allergen Reactions   Morphine And Related Nausea And Vomiting      Home Medications:  Current Outpatient Medications:    ACCU-CHEK AVIVA PLUS test strip, USE AS INSTRUCTED, Disp: 100 strip, Rfl: 3   aspirin EC 81 MG tablet, Take 81 mg by mouth daily., Disp: , Rfl:    atorvastatin (LIPITOR) 80 MG tablet, TAKE 1 TABLET BY MOUTH EVERYDAY AT BEDTIME, Disp: 90 tablet, Rfl: 1   CALCIUM-MAGNESIUM-ZINC PO, Take 1 tablet by mouth daily., Disp: , Rfl:    Flaxseed, Linseed, (FLAXSEED OIL PO), Take 1 tablet by mouth daily. , Disp: , Rfl:    furosemide (LASIX) 40 MG tablet, TAKE 1 TABLET BY MOUTH EVERY DAY, Disp: 90 tablet, Rfl: 3   gabapentin (NEURONTIN) 300 MG capsule, Take 1 capsule (300 mg total) by mouth 2 (two) times daily. (Patient taking differently: Take 300 mg by mouth at bedtime. 2 TABLETS AT BEDTIME), Disp: 60 capsule, Rfl: 2   ibuprofen (ADVIL,MOTRIN) 200 MG tablet, Take 800 mg by mouth daily as needed for moderate pain., Disp: , Rfl:    lisinopril (ZESTRIL) 10 MG tablet, Take 1 tablet (10 mg total) by mouth daily., Disp: 90 tablet, Rfl: 3   metFORMIN (GLUCOPHAGE) 500 MG tablet, TAKE  1 TABLET TWICE A DAY, Disp: 180 tablet, Rfl: 3   metoprolol succinate (TOPROL-XL) 25 MG 24 hr tablet, Take 1 tablet (25 mg total) by mouth at bedtime., Disp: 90 tablet, Rfl: 3   Multiple Vitamin (MULTIVITAMIN WITH MINERALS) TABS, Take 1 tablet by mouth daily., Disp: , Rfl:    testosterone cypionate (DEPO-TESTOSTERONE) 200 MG/ML injection, Inject 0.5 mLs (100 mg total) into the muscle every 7 (seven) days., Disp: 10 mL, Rfl: 0   nitroGLYCERIN (NITROSTAT) 0.4 MG SL tablet, Place 1 tablet (0.4 mg total) under the tongue every 5 (five) minutes as needed for chest pain. (Patient not taking: Reported on 10/25/2019), Disp: 25 tablet, Rfl: 3   tadalafil (CIALIS) 10 MG tablet, Take 1 tablet (10 mg total) by mouth every other day as needed for erectile dysfunction. (Patient not taking: Reported on 10/25/2019), Disp: 10 tablet, Rfl: 3   Family History: family history includes Cushing syndrome in his sister; Diabetes in his father and sister; Hepatitis B in his father; Lung cancer in his mother.    Social History:   reports that he has  been smoking cigarettes. He has a 50.00 pack-year smoking history. He has quit using smokeless tobacco. He reports current alcohol use of about 1.0 standard drink of alcohol per week. He reports current drug use. Frequency: 1.00 time per week. Drug: Marijuana.   Review of Systems: Constitutional: Denies weight loss/weight gain  Eyes: No changes in vision. ENT: No oral lesions, sore throat.  GI: see HPI.  Heme/Lymph: No easy bruising.  CV: No chest pain.  GU: No hematuria.  Integumentary: No rashes.  Neuro: No headaches.  Psych: No depression/anxiety.  Endocrine: No heat/cold intolerance.  Allergic/Immunologic: No urticaria.  Resp: No cough, SOB.  Musculoskeletal: No joint swelling.    Physical Examination: BP (!) 157/70 (BP Location: Right Arm, Patient Position: Sitting, Cuff Size: Normal)    Pulse 62    Temp 97.9 F (36.6 C) (Oral)    Ht 5\' 7"  (1.702 m)    Wt  195 lb 9.6 oz (88.7 kg)    BMI 30.64 kg/m  Gen: NAD, alert and oriented x 4 HEENT: PEERLA, EOMI, Neck: supple, no JVD Chest: CTA bilaterally, no wheezes, crackles, or other adventitious sounds CV: RRR, 2+ systolic murmur  Abd: soft, NT, ND, +BS in all four quadrants; no HSM, guarding, ridigity, or rebound tenderness Ext: no edema, well perfused with 2+ pulses, Skin: no rash or lesions noted on observed skin Lymph: no noted LAD  Data:   Barium swallow - IMPRESSION: 2017 Area of irregularity at the anterior aspect of the distal esophagus questionably representing segmental esophagitis though plaque-like tumor is not completely excluded ; endoscopic evaluation recommended.  Diffuse esophageal dysmotility.  Assessment/Plan: Mr. Maffei is a 69 y.o. male  [facility] was seen today for new patient (initial visit).  Diagnoses and all orders for this visit:  Dysphagia, unspecified type  Personal history of colonic polyps     1. Hx of colon polyps - per patient last colonoscopy about 7-8 years ago. Due for repeat. No lower GI alarm symptoms. No family hx colon polyps or colon cancer.   2. Dysphagia - reports severe episode to steak few months ago. Will schedule EGD for evaluation at time of colonoscopy to exclude stricture, barium swallow 4 years ago showed dysmotility. He has no GERD symptoms and is not on a PPI  Patient denies CP, SOB, and use of blood thinners. I discussed the risks and benefits of procedure including bleeding, perforation, infection, missed lesions, medication reactions and possible hospitalization or surgery if complications. All questions answered.      I personally performed the service, non-incident to. (WP)  Laurine Blazer, [facility]

## 2019-10-25 NOTE — Patient Instructions (Signed)
We are scheduling an endoscopy and colonoscopy for evaluation.

## 2019-10-25 NOTE — Telephone Encounter (Signed)
Patient needs Plenvu (copay card) ° °

## 2019-11-05 ENCOUNTER — Other Ambulatory Visit (INDEPENDENT_AMBULATORY_CARE_PROVIDER_SITE_OTHER): Payer: Self-pay | Admitting: *Deleted

## 2019-11-05 DIAGNOSIS — R1310 Dysphagia, unspecified: Secondary | ICD-10-CM

## 2019-11-05 NOTE — Patient Instructions (Signed)
Jonathan Gilmore  11/05/2019     @PREFPERIOPPHARMACY @   Your procedure is scheduled on  11/09/2019.  Report to [facility] at  0730  A.M.  Call this number if you have problems the morning of surgery:  [phone]   Remember:  Follow the diet and prep instructions given to you by the office.                      Take these medicines the morning of surgery with A SIP OF WATER  Gabapentin, metoprolol.    Do not wear jewelry, make-up or nail polish.  Do not wear lotions, powders, or perfume. Please wear deodorant and brush your teeth.  Do not shave 48 hours prior to surgery.  Men may shave face and neck.  Do not bring valuables to the hospital.  [facility] is not responsible for any belongings or valuables.  Contacts, dentures or bridgework may not be worn into surgery.  Leave your suitcase in the car.  After surgery it may be brought to your room.  For patients admitted to the hospital, discharge time will be determined by your treatment team.  Patients discharged the day of surgery will not be allowed to drive home.   Name and phone number of your driver:   family Special instructions:  DO NOT smoke the morning of your procedure.  Please read over the following fact sheets that you were given. Anesthesia Post-op Instructions and Care and Recovery After Surgery       Upper Endoscopy, Adult, Care After This sheet gives you information about how to care for yourself after your procedure. Your health care provider may also give you more specific instructions. If you have problems or questions, contact your health care provider. What can I expect after the procedure? After the procedure, it is common to have:  A sore throat.  Mild stomach pain or discomfort.  Bloating.  Nausea. Follow these instructions at home:   Follow instructions from your health care provider about what to eat or drink after your procedure.  Return to your normal activities as told  by your health care provider. Ask your health care provider what activities are safe for you.  Take over-the-counter and prescription medicines only as told by your health care provider.  Do not drive for 24 hours if you were given a sedative during your procedure.  Keep all follow-up visits as told by your health care provider. This is important. Contact a health care provider if you have:  A sore throat that lasts longer than one day.  Trouble swallowing. Get help right away if:  You vomit blood or your vomit looks like coffee grounds.  You have: ? A fever. ? Bloody, black, or tarry stools. ? A severe sore throat or you cannot swallow. ? Difficulty breathing. ? Severe pain in your chest or abdomen. Summary  After the procedure, it is common to have a sore throat, mild stomach discomfort, bloating, and nausea.  Do not drive for 24 hours if you were given a sedative during the procedure.  Follow instructions from your health care provider about what to eat or drink after your procedure.  Return to your normal activities as told by your health care provider. This information is not intended to replace advice given to you by your health care provider. Make sure you discuss any questions you have with your health care provider. Document  Revised: 08/23/2017 Document Reviewed: 08/01/2017 Elsevier Patient Education  [place].  Esophageal Dilatation Esophageal dilatation, also called esophageal dilation, is a procedure to widen or open (dilate) a blocked or narrowed part of the esophagus. The esophagus is the part of the body that moves food and liquid from the mouth to the stomach. You may need this procedure if:  You have a buildup of scar tissue in your esophagus that makes it difficult, painful, or impossible to swallow. This can be caused by gastroesophageal reflux disease (GERD).  You have cancer of the esophagus.  There is a problem with how food moves through your  esophagus. In some cases, you may need this procedure repeated at a later time to dilate the esophagus gradually. Tell a health care provider about:  Any allergies you have.  All medicines you are taking, including vitamins, herbs, eye drops, creams, and over-the-counter medicines.  Any problems you or family members have had with anesthetic medicines.  Any blood disorders you have.  Any surgeries you have had.  Any medical conditions you have.  Any antibiotic medicines you are required to take before dental procedures.  Whether you are pregnant or may be pregnant. What are the risks? Generally, this is a safe procedure. However, problems may occur, including:  Bleeding due to a tear in the lining of the esophagus.  A hole (perforation) in the esophagus. What happens before the procedure?  Follow instructions from your health care provider about eating or drinking restrictions.  Ask your health care provider about changing or stopping your regular medicines. This is especially important if you are taking diabetes medicines or blood thinners.  Plan to have someone take you home from the hospital or clinic.  Plan to have a responsible adult care for you for at least 24 hours after you leave the hospital or clinic. This is important. What happens during the procedure?  You may be given a medicine to help you relax (sedative).  A numbing medicine may be sprayed into the back of your throat, or you may gargle the medicine.  Your health care provider may perform the dilatation using various surgical instruments, such as: ? Simple dilators. This instrument is carefully placed in the esophagus to stretch it. ? Guided wire bougies. This involves using an endoscope to insert a wire into the esophagus. A dilator is passed over this wire to enlarge the esophagus. Then the wire is removed. ? Balloon dilators. An endoscope with a small balloon at the end is inserted into the esophagus.  The balloon is inflated to stretch the esophagus and open it up. The procedure may vary among health care providers and hospitals. What happens after the procedure?  Your blood pressure, heart rate, breathing rate, and blood oxygen level will be monitored until the medicines you were given have worn off.  Your throat may feel slightly sore and numb. This will improve slowly over time.  You will not be allowed to eat or drink until your throat is no longer numb.  When you are able to drink, urinate, and sit on the edge of the bed without nausea or dizziness, you may be able to return home. Follow these instructions at home:  Take over-the-counter and prescription medicines only as told by your health care provider.  Do not drive for 24 hours if you were given a sedative during your procedure.  You should have a responsible adult with you for 24 hours after the procedure.  Follow instructions  from your health care provider about any eating or drinking restrictions.  Do not use any products that contain nicotine or tobacco, such as cigarettes and e-cigarettes. If you need help quitting, ask your health care provider.  Keep all follow-up visits as told by your health care provider. This is important. Get help right away if you:  Have a fever.  Have chest pain.  Have pain that is not relieved by medication.  Have trouble breathing.  Have trouble swallowing.  Vomit blood. Summary  Esophageal dilatation, also called esophageal dilation, is a procedure to widen or open (dilate) a blocked or narrowed part of the esophagus.  Plan to have someone take you home from the hospital or clinic.  For this procedure, a numbing medicine may be sprayed into the back of your throat, or you may gargle the medicine.  Do not drive for 24 hours if you were given a sedative during your procedure. This information is not intended to replace advice given to you by your health care provider. Make  sure you discuss any questions you have with your health care provider. Document Revised: 12/27/2018 Document Reviewed: 01/04/2017 Elsevier Patient Education  [place].  Colonoscopy, Adult, Care After This sheet gives you information about how to care for yourself after your procedure. Your health care provider may also give you more specific instructions. If you have problems or questions, contact your health care provider. What can I expect after the procedure? After the procedure, it is common to have:  A small amount of blood in your stool for 24 hours after the procedure.  Some gas.  Mild cramping or bloating of your abdomen. Follow these instructions at home: Eating and drinking   Drink enough fluid to keep your urine pale yellow.  Follow instructions from your health care provider about eating or drinking restrictions.  Resume your normal diet as instructed by your health care provider. Avoid heavy or fried foods that are hard to digest. Activity  Rest as told by your health care provider.  Avoid sitting for a long time without moving. Get up to take short walks every 1-2 hours. This is important to improve blood flow and breathing. Ask for help if you feel weak or unsteady.  Return to your normal activities as told by your health care provider. Ask your health care provider what activities are safe for you. Managing cramping and bloating   Try walking around when you have cramps or feel bloated.  Apply heat to your abdomen as told by your health care provider. Use the heat source that your health care provider recommends, such as a moist heat pack or a heating pad. ? Place a towel between your skin and the heat source. ? Leave the heat on for 20-30 minutes. ? Remove the heat if your skin turns bright red. This is especially important if you are unable to feel pain, heat, or cold. You may have a greater risk of getting burned. General instructions  For the  first 24 hours after the procedure: ? Do not drive or use machinery. ? Do not sign important documents. ? Do not drink alcohol. ? Do your regular daily activities at a slower pace than normal. ? Eat soft foods that are easy to digest.  Take over-the-counter and prescription medicines only as told by your health care provider.  Keep all follow-up visits as told by your health care provider. This is important. Contact a health care provider if:  You have  blood in your stool 2-3 days after the procedure. Get help right away if you have:  More than a small spotting of blood in your stool.  Large blood clots in your stool.  Swelling of your abdomen.  Nausea or vomiting.  A fever.  Increasing pain in your abdomen that is not relieved with medicine. Summary  After the procedure, it is common to have a small amount of blood in your stool. You may also have mild cramping and bloating of your abdomen.  For the first 24 hours after the procedure, do not drive or use machinery, sign important documents, or drink alcohol.  Get help right away if you have a lot of blood in your stool, nausea or vomiting, a fever, or increased pain in your abdomen. This information is not intended to replace advice given to you by your health care provider. Make sure you discuss any questions you have with your health care provider. Document Revised: 09/25/2018 Document Reviewed: 09/25/2018 Elsevier Patient Education  [place] After These instructions provide you with information about caring for yourself after your procedure. Your health care provider may also give you more specific instructions. Your treatment has been planned according to current medical practices, but problems sometimes occur. Call your health care provider if you have any problems or questions after your procedure. What can I expect after the procedure? After your procedure, you may:  Feel  sleepy for several hours.  Feel clumsy and have poor balance for several hours.  Feel forgetful about what happened after the procedure.  Have poor judgment for several hours.  Feel nauseous or vomit.  Have a sore throat if you had a breathing tube during the procedure. Follow these instructions at home: For at least 24 hours after the procedure:      Have a responsible adult stay with you. It is important to have someone help care for you until you are awake and alert.  Rest as needed.  Do not: ? Participate in activities in which you could fall or become injured. ? Drive. ? Use heavy machinery. ? Drink alcohol. ? Take sleeping pills or medicines that cause drowsiness. ? Make important decisions or sign legal documents. ? Take care of children on your own. Eating and drinking  Follow the diet that is recommended by your health care provider.  If you vomit, drink water, juice, or soup when you can drink without vomiting.  Make sure you have little or no nausea before eating solid foods. General instructions  Take over-the-counter and prescription medicines only as told by your health care provider.  If you have sleep apnea, surgery and certain medicines can increase your risk for breathing problems. Follow instructions from your health care provider about wearing your sleep device: ? Anytime you are sleeping, including during daytime naps. ? While taking prescription pain medicines, sleeping medicines, or medicines that make you drowsy.  If you smoke, do not smoke without supervision.  Keep all follow-up visits as told by your health care provider. This is important. Contact a health care provider if:  You keep feeling nauseous or you keep vomiting.  You feel light-headed.  You develop a rash.  You have a fever. Get help right away if:  You have trouble breathing. Summary  For several hours after your procedure, you may feel sleepy and have poor  judgment.  Have a responsible adult stay with you for at least 24 hours or until you are  awake and alert. This information is not intended to replace advice given to you by your health care provider. Make sure you discuss any questions you have with your health care provider. Document Revised: 05/30/2017 Document Reviewed: 06/22/2015 Elsevier Patient Education  [place].

## 2019-11-07 ENCOUNTER — Encounter (HOSPITAL_COMMUNITY)
Admission: RE | Admit: 2019-11-07 | Discharge: 2019-11-07 | Disposition: A | Discharge status: Home / Self Care | Payer: Medicare HMO | Source: Ambulatory Visit | Attending: Gastroenterology | Admitting: Gastroenterology

## 2019-11-07 ENCOUNTER — Other Ambulatory Visit: Payer: Self-pay

## 2019-11-07 ENCOUNTER — Other Ambulatory Visit (HOSPITAL_COMMUNITY)
Admission: RE | Admit: 2019-11-07 | Discharge: 2019-11-07 | Disposition: A | Discharge status: Home / Self Care | Payer: Medicare HMO | Source: Ambulatory Visit | Attending: Gastroenterology | Admitting: Gastroenterology

## 2019-11-07 DIAGNOSIS — Z20822 Contact with and (suspected) exposure to covid-19: Secondary | ICD-10-CM | POA: Diagnosis not present

## 2019-11-07 DIAGNOSIS — Z01812 Encounter for preprocedural laboratory examination: Secondary | ICD-10-CM | POA: Diagnosis not present

## 2019-11-07 DIAGNOSIS — R1310 Dysphagia, unspecified: Secondary | ICD-10-CM

## 2019-11-08 LAB — SARS CORONAVIRUS 2 (TAT 6-24 HRS): SARS Coronavirus 2: NEGATIVE

## 2019-11-09 ENCOUNTER — Ambulatory Visit (HOSPITAL_COMMUNITY): Payer: Medicare HMO | Admitting: Anesthesiology

## 2019-11-09 ENCOUNTER — Encounter (HOSPITAL_COMMUNITY)
Admission: RE | Disposition: A | Discharge status: Home / Self Care | Payer: Self-pay | Source: Home / Self Care | Attending: Gastroenterology

## 2019-11-09 ENCOUNTER — Encounter (HOSPITAL_COMMUNITY): Payer: Self-pay | Admitting: Gastroenterology

## 2019-11-09 ENCOUNTER — Ambulatory Visit (HOSPITAL_COMMUNITY)
Admission: RE | Admit: 2019-11-09 | Discharge: 2019-11-09 | Disposition: A | Discharge status: Home / Self Care | Payer: Medicare HMO | Attending: Gastroenterology | Admitting: Gastroenterology

## 2019-11-09 DIAGNOSIS — Z7984 Long term (current) use of oral hypoglycemic drugs: Secondary | ICD-10-CM | POA: Diagnosis not present

## 2019-11-09 DIAGNOSIS — K635 Polyp of colon: Secondary | ICD-10-CM | POA: Diagnosis not present

## 2019-11-09 DIAGNOSIS — D122 Benign neoplasm of ascending colon: Secondary | ICD-10-CM | POA: Diagnosis not present

## 2019-11-09 DIAGNOSIS — K3189 Other diseases of stomach and duodenum: Secondary | ICD-10-CM | POA: Diagnosis not present

## 2019-11-09 DIAGNOSIS — Z79899 Other long term (current) drug therapy: Secondary | ICD-10-CM | POA: Insufficient documentation

## 2019-11-09 DIAGNOSIS — K319 Disease of stomach and duodenum, unspecified: Secondary | ICD-10-CM | POA: Insufficient documentation

## 2019-11-09 DIAGNOSIS — Z09 Encounter for follow-up examination after completed treatment for conditions other than malignant neoplasm: Secondary | ICD-10-CM

## 2019-11-09 DIAGNOSIS — K209 Esophagitis, unspecified without bleeding: Secondary | ICD-10-CM

## 2019-11-09 DIAGNOSIS — G4730 Sleep apnea, unspecified: Secondary | ICD-10-CM | POA: Insufficient documentation

## 2019-11-09 DIAGNOSIS — I2510 Atherosclerotic heart disease of native coronary artery without angina pectoris: Secondary | ICD-10-CM | POA: Diagnosis not present

## 2019-11-09 DIAGNOSIS — Z1211 Encounter for screening for malignant neoplasm of colon: Secondary | ICD-10-CM | POA: Diagnosis not present

## 2019-11-09 DIAGNOSIS — K5730 Diverticulosis of large intestine without perforation or abscess without bleeding: Secondary | ICD-10-CM | POA: Diagnosis not present

## 2019-11-09 DIAGNOSIS — Z7982 Long term (current) use of aspirin: Secondary | ICD-10-CM | POA: Insufficient documentation

## 2019-11-09 DIAGNOSIS — D120 Benign neoplasm of cecum: Secondary | ICD-10-CM | POA: Diagnosis not present

## 2019-11-09 DIAGNOSIS — D123 Benign neoplasm of transverse colon: Secondary | ICD-10-CM

## 2019-11-09 DIAGNOSIS — Z86010 Personal history of colonic polyps: Secondary | ICD-10-CM | POA: Diagnosis not present

## 2019-11-09 DIAGNOSIS — I110 Hypertensive heart disease with heart failure: Secondary | ICD-10-CM | POA: Diagnosis not present

## 2019-11-09 DIAGNOSIS — I509 Heart failure, unspecified: Secondary | ICD-10-CM | POA: Diagnosis not present

## 2019-11-09 DIAGNOSIS — K269 Duodenal ulcer, unspecified as acute or chronic, without hemorrhage or perforation: Secondary | ICD-10-CM | POA: Diagnosis not present

## 2019-11-09 DIAGNOSIS — Z79890 Hormone replacement therapy (postmenopausal): Secondary | ICD-10-CM | POA: Insufficient documentation

## 2019-11-09 DIAGNOSIS — F17210 Nicotine dependence, cigarettes, uncomplicated: Secondary | ICD-10-CM | POA: Diagnosis not present

## 2019-11-09 DIAGNOSIS — E119 Type 2 diabetes mellitus without complications: Secondary | ICD-10-CM | POA: Diagnosis not present

## 2019-11-09 DIAGNOSIS — R1310 Dysphagia, unspecified: Secondary | ICD-10-CM

## 2019-11-09 HISTORY — PX: BIOPSY: SHX5522

## 2019-11-09 HISTORY — PX: POLYPECTOMY: SHX5525

## 2019-11-09 HISTORY — PX: ESOPHAGEAL DILATION: SHX303

## 2019-11-09 HISTORY — PX: ESOPHAGOGASTRODUODENOSCOPY (EGD) WITH PROPOFOL: SHX5813

## 2019-11-09 HISTORY — PX: COLONOSCOPY WITH PROPOFOL: SHX5780

## 2019-11-09 LAB — GLUCOSE, CAPILLARY
Glucose-Capillary: 160 mg/dL — ABNORMAL HIGH (ref 70–99)
Glucose-Capillary: 165 mg/dL — ABNORMAL HIGH (ref 70–99)

## 2019-11-09 SURGERY — COLONOSCOPY WITH PROPOFOL
Anesthesia: General

## 2019-11-09 MED ORDER — GLYCOPYRROLATE 0.2 MG/ML IJ SOLN
INTRAMUSCULAR | Status: AC
  Filled 2019-11-09: qty 1

## 2019-11-09 MED ORDER — LACTATED RINGERS IV SOLN: INTRAVENOUS | Status: DC

## 2019-11-09 MED ORDER — OMEPRAZOLE 40 MG PO CPDR: 40.0000 mg | DELAYED_RELEASE_CAPSULE | Freq: Two times a day (BID) | ORAL | 2 refills | Status: DC

## 2019-11-09 MED ORDER — STERILE WATER FOR IRRIGATION IR SOLN
Status: DC | PRN
  Administered 2019-11-09: 1.5 mL

## 2019-11-09 MED ORDER — GLYCOPYRROLATE 0.2 MG/ML IJ SOLN
0.2000 mg | Freq: Once | INTRAMUSCULAR | Status: AC
  Administered 2019-11-09: 0.2 mg via INTRAVENOUS

## 2019-11-09 MED ORDER — LIDOCAINE VISCOUS HCL 2 % MT SOLN
OROMUCOSAL | Status: AC
  Filled 2019-11-09: qty 15

## 2019-11-09 MED ORDER — CHLORHEXIDINE GLUCONATE CLOTH 2 % EX PADS: 6.0000 | MEDICATED_PAD | Freq: Once | CUTANEOUS | Status: DC

## 2019-11-09 MED ORDER — PROPOFOL 500 MG/50ML IV EMUL
INTRAVENOUS | Status: DC | PRN
  Administered 2019-11-09 (×3): 100 ug/kg/min via INTRAVENOUS
  Administered 2019-11-09: 80 ug/kg/min via INTRAVENOUS
  Administered 2019-11-09: 100 ug/kg/min via INTRAVENOUS

## 2019-11-09 MED ORDER — PROPOFOL 10 MG/ML IV BOLUS
INTRAVENOUS | Status: DC | PRN
  Administered 2019-11-09: 20 mg via INTRAVENOUS
  Administered 2019-11-09: 40 mg via INTRAVENOUS
  Administered 2019-11-09 (×2): 20 mg via INTRAVENOUS
  Administered 2019-11-09: 120 mg via INTRAVENOUS
  Administered 2019-11-09 (×2): 20 mg via INTRAVENOUS
  Administered 2019-11-09: 40 mg via INTRAVENOUS
  Administered 2019-11-09: 50 mg via INTRAVENOUS

## 2019-11-09 MED ORDER — LIDOCAINE HCL (CARDIAC) PF 50 MG/5ML IV SOSY
PREFILLED_SYRINGE | INTRAVENOUS | Status: DC | PRN
  Administered 2019-11-09: 100 mg via INTRAVENOUS

## 2019-11-09 MED ORDER — LIDOCAINE VISCOUS HCL 2 % MT SOLN
15.0000 mL | Freq: Once | OROMUCOSAL | Status: AC
  Administered 2019-11-09: 15 mL via OROMUCOSAL

## 2019-11-09 NOTE — H&P (Signed)
Jonathan Gilmore is an 69 y.o. male.   Chief Complaint: Dysphagia and history of colon polyps HPI: 69 year old male with past medical history of diabetes, depression, coronary artery disease, CHF, anxiety and arthritis, who comes to the hospital for evaluation of episodes of dysphagia and surveillance of colon polyps.  Patient reports that for the last couple months he has dysphagia to both solids and liquids but worse to solids especially when he eats steak. Never had an episode of food impaction. Denies any pill dysphagia. Denies any heartburn. His last EGD was performed in thousand 17. Was found to have nodularity in the mid esophagus and erosive gastritis. An empiric dilation was performed with a [place] without mucosal disruption. Biopsies from esophageal mucosa nodules were negative for eosinophilic esophagitis  Patient reported that he believes 10 or 9 years ago he had a colonoscopy and was found to have multiple polyps. This was performed at [facility]. No reports are available. Denies any family history of colorectal cancer.  Past Medical History:  Diagnosis Date  . Abnormal cardiovascular stress test 03/2013  . Anxiety   . Arthritis   . Back pain   . CHF (congestive heart failure) ([place])   . Coronary artery disease    Multivessel with significant LAD involvement by chest CT 2014  . Depression   . Diastolic dysfunction   . Essential hypertension   . Lumbar herniated disc    L4-L5  . MRSA (methicillin resistant staph aureus) culture positive   . Shortness of breath dyspnea   . Sleep apnea    No cpap  . Type 2 diabetes mellitus ([place])     Past Surgical History:  Procedure Laterality Date  . ANTERIOR CERVICAL DECOMP/DISCECTOMY FUSION  01/18/2011   Procedure: ANTERIOR CERVICAL DECOMPRESSION/DISCECTOMY FUSION 2 LEVELS;  Surgeon: Cooper Render Pool;  Location: [place] [facility]URO ORS;  Service: Neurosurgery;  Laterality: N/A;  anterior cervical discectomy and fusion with allograft and  plating, cervical five-six, cervical six-seven  . BIOPSY N/A 04/04/2015   Procedure: BIOPSY;  Surgeon: Rogene Houston, MD;  [facility];  Service: Endoscopy;  Laterality: N/A;  . Bone spur removed  1997   Left shoulder  . CARDIAC CATHETERIZATION N/A 08/08/2015   Procedure: Right/Left Heart Cath and Coronary Angiography;  Surgeon: Sherren Mocha, MD;  Location: [place] CV LAB;  Service: Cardiovascular;  Laterality: N/A;  . CARPAL TUNNEL RELEASE Right   . ESOPHAGEAL DILATION N/A 04/04/2015   Procedure: ESOPHAGEAL DILATION;  Surgeon: Rogene Houston, MD;  Location: [facility];  Service: Endoscopy;  Laterality: N/A;  . ESOPHAGOGASTRODUODENOSCOPY N/A 04/04/2015   Procedure: ESOPHAGOGASTRODUODENOSCOPY (EGD);  Surgeon: Rogene Houston, MD;  Location[facility];  Service: Endoscopy;  Laterality: N/A;  1240  . HEMORRHOID SURGERY    . TONSILLECTOMY      Family History  Problem Relation Age of Onset  . Lung cancer Mother   . Diabetes Father   . Hepatitis B Father   . Diabetes Sister   . Cushing syndrome Sister    Social History:  reports that he has been smoking cigarettes. He has a 50.00 pack-year smoking history. He has quit using smokeless tobacco. He reports current alcohol use of about 1.0 standard drink of alcohol per week. He reports current drug use. Frequency: 1.00 time per week. Drug: Marijuana.  Allergies:  Allergies  Allergen Reactions  . Morphine And Related Nausea And Vomiting    Medications Prior to Admission  Medication Sig Dispense  Refill  . aspirin EC 81 MG tablet Take 81 mg by mouth daily.    Marland Kitchen atorvastatin (LIPITOR) 80 MG tablet TAKE 1 TABLET BY MOUTH EVERYDAY AT BEDTIME (Patient taking differently: Take 80 mg by mouth daily. ) 90 tablet 1  . CALCIUM-MAGNESIUM-ZINC PO Take 1 tablet by mouth daily.    . Flaxseed, Linseed, (FLAXSEED OIL PO) Take 1 capsule by mouth daily.     . furosemide (LASIX) 40 MG tablet TAKE 1 TABLET BY MOUTH EVERY DAY (Patient  taking differently: Take 40 mg by mouth daily. ) 90 tablet 3  . ibuprofen (ADVIL,MOTRIN) 200 MG tablet Take 400 mg by mouth every 6 (six) hours as needed for moderate pain.     Marland Kitchen lisinopril (ZESTRIL) 10 MG tablet Take 1 tablet (10 mg total) by mouth daily. 90 tablet 3  . metFORMIN (GLUCOPHAGE) 500 MG tablet TAKE 1 TABLET TWICE A DAY (Patient taking differently: Take 500 mg by mouth 2 (two) times daily with a meal. ) 180 tablet 3  . metoprolol succinate (TOPROL-XL) 25 MG 24 hr tablet Take 1 tablet (25 mg total) by mouth at bedtime. 90 tablet 3  . Multiple Vitamin (MULTIVITAMIN WITH MINERALS) TABS Take 1 tablet by mouth daily.    Marland Kitchen testosterone cypionate (DEPO-TESTOSTERONE) 200 MG/ML injection Inject 0.5 mLs (100 mg total) into the muscle every 7 (seven) days. 10 mL 0  . ACCU-CHEK AVIVA PLUS test strip USE AS INSTRUCTED 100 strip 3  . gabapentin (NEURONTIN) 300 MG capsule Take 1 capsule (300 mg total) by mouth 2 (two) times daily. 60 capsule 2  . nitroGLYCERIN (NITROSTAT) 0.4 MG SL tablet Place 1 tablet (0.4 mg total) under the tongue every 5 (five) minutes as needed for chest pain. (Patient not taking: Reported on 10/25/2019) 25 tablet 3  . tadalafil (CIALIS) 10 MG tablet Take 1 tablet (10 mg total) by mouth every other day as needed for erectile dysfunction. (Patient not taking: Reported on 10/25/2019) 10 tablet 3    Results for orders placed or performed during the hospital encounter of 11/09/19 (from the past 48 hour(s))  Glucose, capillary     Status: Abnormal   Collection Time: 11/09/19  7:55 AM  Result Value Ref Range   Glucose-Capillary 160 (H) 70 - 99 mg/dL    Comment: Glucose reference range applies only to samples taken after fasting for at least 8 hours.   No results found.  Review of Systems  Constitutional: Negative.   HENT: Positive for trouble swallowing.   Eyes: Negative.   Respiratory: Negative.   Cardiovascular: Negative.   Gastrointestinal: Negative.   Endocrine:  Negative.   Genitourinary: Negative.   Musculoskeletal: Negative.   Skin: Negative.   Allergic/Immunologic: Negative.   Neurological: Negative.   Hematological: Negative.   Psychiatric/Behavioral: Negative.     Blood pressure (!) 175/71, pulse 85, temperature 98.4 F (36.9 C), temperature source Oral, resp. rate 18, height 5\' 7"  (1.702 m), weight 88.7 kg, SpO2 100 %. Physical Exam  GENERAL: The patient is AO x3, in no acute distress. Obese. HEENT: Head is normocephalic and atraumatic. EOMI are intact. Mouth is well hydrated and without lesions. NECK: Supple. No masses LUNGS: Clear to auscultation. No presence of rhonchi/wheezing/rales. Adequate chest expansion HEART: RRR, normal s1 and s2. ABDOMEN: Soft, nontender, no guarding, no peritoneal signs, and nondistended. BS +. No masses. EXTREMITIES: Without any cyanosis, clubbing, rash, lesions or edema. NEUROLOGIC: AOx3, no focal motor deficit. SKIN: no jaundice, no rashes  Assessment/Plan  69 year old male  with past medical history of diabetes, depression, coronary artery disease, CHF, anxiety and arthritis, who comes to the hospital for evaluation of episodes of dysphagia and surveillance of colon polyps. We will proceed with EGD with possible dilation and colonoscopy.  Harvel Quale, MD 11/09/2019, 9:06 AM

## 2019-11-09 NOTE — Anesthesia Postprocedure Evaluation (Signed)
Anesthesia Post Note  Patient: Marshaun D Taitt  Procedure(s) Performed: COLONOSCOPY WITH PROPOFOL (N/A ) ESOPHAGOGASTRODUODENOSCOPY (EGD) WITH PROPOFOL (N/A ) ESOPHAGEAL DILATION (N/A ) BIOPSY POLYPECTOMY  Patient location during evaluation: PACU Anesthesia Type: General Level of consciousness: awake and alert and patient cooperative Pain management: satisfactory to patient Vital Signs Assessment: post-procedure vital signs reviewed and stable Respiratory status: spontaneous breathing Cardiovascular status: stable Postop Assessment: no apparent nausea or vomiting Anesthetic complications: no   No complications documented.   Last Vitals:  Vitals:   11/09/19 0806 11/09/19 1104  BP: (!) 175/71 (!) 105/44  Pulse: 85 (!) 111  Resp: 18 20  Temp: 36.9 C 36.8 C  SpO2: 100% 99%    Last Pain:  Vitals:   11/09/19 1104  TempSrc:   PainSc: 0-No pain                 Essynce Munsch

## 2019-11-09 NOTE — Op Note (Signed)
[facility] Patient Name: Jonathan Gilmore Procedure Date: 11/09/2019 9:34 AM MRN: [id number] Date of Birth: Jul 02, 1950 Attending MD: Maylon Peppers ,  CSN: [id number] Age: 69 Admit Type: Outpatient Procedure:                Colonoscopy Indications:              High risk colon cancer surveillance: Personal                            history of colonic polyps Providers:                Maylon Peppers, Rosina Lowenstein, RN, Caprice Kluver,                            Raphael Gibney, Technician Referring MD:              Medicines:                Monitored Anesthesia Care Complications:            No immediate complications. Estimated Blood Loss:     Estimated blood loss: none. Procedure:                Pre-Anesthesia Assessment:                           - Prior to the procedure, a History and Physical                            was performed, and patient medications, allergies                            and sensitivities were reviewed. The patient's                            tolerance of previous anesthesia was reviewed.                           - The risks and benefits of the procedure and the                            sedation options and risks were discussed with the                            patient. All questions were answered and informed                            consent was obtained.                           - ASA Grade Assessment: II - A patient with mild                            systemic disease.                           After obtaining informed consent, the colonoscope  was passed under direct vision. Throughout the                            procedure, the patient's blood pressure, pulse, and                            oxygen saturations were monitored continuously. The                            PCF-H190DL ([id number]) scope was introduced through                            the anus and advanced to the the cecum, identified                             by appendiceal orifice and ileocecal valve. The                            colonoscopy was technically difficult and complex                            due to colonic spasms. The patient tolerated the                            procedure well. The quality of the bowel                            preparation was fair. Scope withdrawal time was 30                            minutes. Scope In: 9:38:34 AM Scope Out: 10:56:43 AM Scope Withdrawal Time: 1 hour 11 minutes 29 seconds  Total Procedure Duration: 1 hour 18 minutes 9 seconds  Findings:      The perianal and digital rectal examinations were normal.      Two sessile polyps were found in the cecum. The polyps were 20 mm in       size. These polyps were removed with a hot snare. Resection and       retrieval were complete.      A 3 mm polyp was found in the cecum. The polyp was sessile. The polyp       was removed with a cold biopsy forceps. Resection and retrieval were       complete.      Two sessile polyps were found in the transverse colon and ascending       colon. The polyps were 5 to 10 mm in size. These polyps were removed       with a cold snare. Resection and retrieval were complete.      A 12 mm polyp was found in the ascending colon. The polyp was sessile.       The polyp was removed with a hot snare. Resection and retrieval were       complete.      A 2 mm polyp was found in the transverse colon. The polyp was sessile.       The polyp was removed with a  cold biopsy forceps. Resection and       retrieval were complete.      A few small-mouthed diverticula were found in the sigmoid colon.      The retroflexed view of the distal rectum and anal verge was normal and       showed no anal or rectal abnormalities. Impression:               - Preparation of the colon was fair.                           - Two 20 mm polyps in the cecum, removed with a hot                            snare. Resected and retrieved.                            - One 3 mm polyp in the cecum, removed with a cold                            biopsy forceps. Resected and retrieved.                           - Two 5 to 10 mm polyps in the transverse colon and                            in the ascending colon, removed with a cold snare.                            Resected and retrieved.                           - One 12 mm polyp in the ascending colon, removed                            with a hot snare. Resected and retrieved.                           - One 2 mm polyp in the transverse colon, removed                            with a cold biopsy forceps. Resected and retrieved.                           - Diverticulosis in the sigmoid colon.                           - The distal rectum and anal verge are normal on                            retroflexion view. Moderate Sedation:      Per Anesthesia Care Recommendation:           - Discharge patient to home (ambulatory).                           -  Resume previous diet.                           - Await pathology results.                           - Repeat colonoscopy in 6 months for surveillance                            due to large size polyps and fair prep. Procedure Code(s):        --- Professional ---                           [phone], GC, Colonoscopy, flexible; with removal of                            tumor(s), polyp(s), or other lesion(s) by snare                            technique                           [id number], 3, Colonoscopy, flexible; with biopsy,                            single or multiple Diagnosis Code(s):        --- Professional ---                           K63.5, Polyp of colon                           Z86.010, Personal history of colonic polyps                           K57.30, Diverticulosis of large intestine without                            perforation or abscess without bleeding CPT copyright 2019 American Medical Association. All rights reserved. The  codes documented in this report are preliminary and upon coder review may  be revised to meet current compliance requirements. Maylon Peppers, MD Maylon Peppers,  11/09/2019 11:13:45 AM This report has been signed electronically. Number of Addenda: 0

## 2019-11-09 NOTE — Anesthesia Preprocedure Evaluation (Signed)
Anesthesia Evaluation  Patient identified by MRN, date of birth, ID band Patient awake    Reviewed: Allergy & Precautions, H&P , NPO status , Patient's Chart, lab work & pertinent test results, reviewed documented beta blocker date and time   Airway Mallampati: II  TM Distance: >3 FB Neck ROM: full    Dental no notable dental hx.    Pulmonary shortness of breath, sleep apnea , Current Smoker,    Pulmonary exam normal breath sounds clear to auscultation       Cardiovascular Exercise Tolerance: Good hypertension, + CAD and +CHF  + Valvular Problems/Murmurs AS  Rhythm:regular Rate:Normal + Systolic murmurs    Neuro/Psych PSYCHIATRIC DISORDERS Anxiety Depression  Neuromuscular disease    GI/Hepatic negative GI ROS, Neg liver ROS,   Endo/Other  negative endocrine ROSdiabetes  Renal/GU negative Renal ROS  negative genitourinary   Musculoskeletal   Abdominal   Peds  Hematology negative hematology ROS (+)   Anesthesia Other Findings   Reproductive/Obstetrics negative OB ROS                             Anesthesia Physical Anesthesia Plan  ASA: III  Anesthesia Plan: General   Post-op Pain Management:    Induction:   PONV Risk Score and Plan: 2 and Propofol infusion  Airway Management Planned:   Additional Equipment:   Intra-op Plan:   Post-operative Plan:   Informed Consent: I have reviewed the patients History and Physical, chart, labs and discussed the procedure including the risks, benefits and alternatives for the proposed anesthesia with the patient or authorized representative who has indicated his/her understanding and acceptance.     Dental Advisory Given  Plan Discussed with: CRNA  Anesthesia Plan Comments:         Anesthesia Quick Evaluation

## 2019-11-09 NOTE — Transfer of Care (Signed)
Immediate Anesthesia Transfer of Care Note  Patient: Jonathan Gilmore  Procedure(s) Performed: COLONOSCOPY WITH PROPOFOL (N/A ) ESOPHAGOGASTRODUODENOSCOPY (EGD) WITH PROPOFOL (N/A ) ESOPHAGEAL DILATION (N/A ) BIOPSY POLYPECTOMY  Patient Location: PACU  Anesthesia Type:General  Level of Consciousness: awake and patient cooperative  Airway & Oxygen Therapy: Patient Spontanous Breathing  Post-op Assessment: Report given to RN and Post -op Vital signs reviewed and stable  Post vital signs: Reviewed and stable  Last Vitals:  Vitals Value Taken Time  BP    Temp 98.3   Pulse 106 11/09/19 1105  Resp 21 11/09/19 1105  SpO2 98 % 11/09/19 1105  Vitals shown include unvalidated device data.  Last Pain:  Vitals:   11/09/19 0910  TempSrc:   PainSc: 0-No pain         Complications: No complications documented.

## 2019-11-09 NOTE — Addendum Note (Signed)
Addendum  created 11/09/19 1137 by Vista Deck, CRNA   Child order released for a procedure order, Clinical Note Signed, Flowsheet accepted, Intraprocedure Blocks edited, Intraprocedure Flowsheets edited

## 2019-11-09 NOTE — Discharge Instructions (Signed)
You are being discharged to home.  Resume your previous diet.  Take Protonix (pantoprazole) 40 mg by mouth twice a day for two months.  We are waiting for your pathology results.  Return to GI clinic with Laurine Blazer for follow up.  Avoid NSAIDs such as ibuprofen Your physician has recommended a repeat colonoscopy in six months for surveillance due to large size polyps and fair prep.   Colonoscopy, Adult, Care After This sheet gives you information about how to care for yourself after your procedure. Your doctor may also give you more specific instructions. If you have problems or questions, call your doctor. What can I expect after the procedure? After the procedure, it is common to have:  A small amount of blood in your poop (stool) for 24 hours.  Some gas.  Mild cramping or bloating in your belly (abdomen). Follow these instructions at home: Eating and drinking   Drink enough fluid to keep your pee (urine) pale yellow.  Follow instructions from your doctor about what you cannot eat or drink.  Return to your normal diet as told by your doctor. Avoid heavy or fried foods that are hard to digest. Activity  Rest as told by your doctor.  Do not sit for a long time without moving. Get up to take short walks every 1-2 hours. This is important. Ask for help if you feel weak or unsteady.  Return to your normal activities as told by your doctor. Ask your doctor what activities are safe for you. To help cramping and bloating:   Try walking around.  Put heat on your belly as told by your doctor. Use the heat source that your doctor recommends, such as a moist heat pack or a heating pad. ? Put a towel between your skin and the heat source. ? Leave the heat on for 20-30 minutes. ? Remove the heat if your skin turns bright red. This is very important if you are unable to feel pain, heat, or cold. You may have a greater risk of getting burned. General instructions  For the first 24  hours after the procedure: ? Do not drive or use machinery. ? Do not sign important documents. ? Do not drink alcohol. ? Do your daily activities more slowly than normal. ? Eat foods that are soft and easy to digest.  Take over-the-counter or prescription medicines only as told by your doctor.  Keep all follow-up visits as told by your doctor. This is important. Contact a doctor if:  You have blood in your poop 2-3 days after the procedure. Get help right away if:  You have more than a small amount of blood in your poop.  You see large clumps of tissue (blood clots) in your poop.  Your belly is swollen.  You feel like you may vomit (nauseous).  You vomit.  You have a fever.  You have belly pain that gets worse, and medicine does not help your pain. Summary  After the procedure, it is common to have a small amount of blood in your poop. You may also have mild cramping and bloating in your belly.  For the first 24 hours after the procedure, do not drive or use machinery, do not sign important documents, and do not drink alcohol.  Get help right away if you have a lot of blood in your poop, feel like you may vomit, have a fever, or have more belly pain. This information is not intended to replace advice given to  you by your health care provider. Make sure you discuss any questions you have with your health care provider. Document Revised: 09/25/2018 Document Reviewed: 09/25/2018 Elsevier Patient Education  [place].  Upper Endoscopy, Adult, Care After This sheet gives you information about how to care for yourself after your procedure. Your health care provider may also give you more specific instructions. If you have problems or questions, contact your health care provider. What can I expect after the procedure? After the procedure, it is common to have:  A sore throat.  Mild stomach pain or discomfort.  Bloating.  Nausea. Follow these instructions at  home:   Follow instructions from your health care provider about what to eat or drink after your procedure.  Return to your normal activities as told by your health care provider. Ask your health care provider what activities are safe for you.  Take over-the-counter and prescription medicines only as told by your health care provider.  Do not drive for 24 hours if you were given a sedative during your procedure.  Keep all follow-up visits as told by your health care provider. This is important. Contact a health care provider if you have:  A sore throat that lasts longer than one day.  Trouble swallowing. Get help right away if:  You vomit blood or your vomit looks like coffee grounds.  You have: ? A fever. ? Bloody, black, or tarry stools. ? A severe sore throat or you cannot swallow. ? Difficulty breathing. ? Severe pain in your chest or abdomen. Summary  After the procedure, it is common to have a sore throat, mild stomach discomfort, bloating, and nausea.  Do not drive for 24 hours if you were given a sedative during the procedure.  Follow instructions from your health care provider about what to eat or drink after your procedure.  Return to your normal activities as told by your health care provider. This information is not intended to replace advice given to you by your health care provider. Make sure you discuss any questions you have with your health care provider. Document Revised: 08/23/2017 Document Reviewed: 08/01/2017 Elsevier Patient Education  2020 [place] After These instructions provide you with information about caring for yourself after your procedure. Your health care provider may also give you more specific instructions. Your treatment has been planned according to current medical practices, but problems sometimes occur. Call your health care provider if you have any problems or questions after your procedure. What  can I expect after the procedure? After your procedure, you may:  Feel sleepy for several hours.  Feel clumsy and have poor balance for several hours.  Feel forgetful about what happened after the procedure.  Have poor judgment for several hours.  Feel nauseous or vomit.  Have a sore throat if you had a breathing tube during the procedure. Follow these instructions at home: For at least 24 hours after the procedure:      Have a responsible adult stay with you. It is important to have someone help care for you until you are awake and alert.  Rest as needed.  Do not: ? Participate in activities in which you could fall or become injured. ? Drive. ? Use heavy machinery. ? Drink alcohol. ? Take sleeping pills or medicines that cause drowsiness. ? Make important decisions or sign legal documents. ? Take care of children on your own. Eating and drinking  Follow the diet that is recommended by  your health care provider.  If you vomit, drink water, juice, or soup when you can drink without vomiting.  Make sure you have little or no nausea before eating solid foods. General instructions  Take over-the-counter and prescription medicines only as told by your health care provider.  If you have sleep apnea, surgery and certain medicines can increase your risk for breathing problems. Follow instructions from your health care provider about wearing your sleep device: ? Anytime you are sleeping, including during daytime naps. ? While taking prescription pain medicines, sleeping medicines, or medicines that make you drowsy.  If you smoke, do not smoke without supervision.  Keep all follow-up visits as told by your health care provider. This is important. Contact a health care provider if:  You keep feeling nauseous or you keep vomiting.  You feel light-headed.  You develop a rash.  You have a fever. Get help right away if:  You have trouble breathing. Summary  For  several hours after your procedure, you may feel sleepy and have poor judgment.  Have a responsible adult stay with you for at least 24 hours or until you are awake and alert. This information is not intended to replace advice given to you by your health care provider. Make sure you discuss any questions you have with your health care provider. Document Revised: 05/30/2017 Document Reviewed: 06/22/2015 Elsevier Patient Education  [place].

## 2019-11-09 NOTE — Anesthesia Procedure Notes (Signed)
Date/Time: 11/09/2019 9:07 AM Performed by: Vista Deck, CRNA Pre-anesthesia Checklist: Patient identified, Emergency Drugs available, Suction available, Timeout performed and Patient being monitored Patient Re-evaluated:Patient Re-evaluated prior to induction Oxygen Delivery Method: Non-rebreather mask

## 2019-11-09 NOTE — Op Note (Addendum)
[facility] Patient Name: Jonathan Gilmore Procedure Date: 11/09/2019 8:51 AM MRN: [id number] Date of Birth: [birth year] Attending MD: Maylon Peppers ,  CSN: [id number] Age: 69 Admit Type: Outpatient Procedure:                Upper GI endoscopy Indications:              Dysphagia Providers:                Maylon Peppers, Caprice Kluver, Rosina Lowenstein, RN,                            Raphael Gibney, Technician Referring MD:              Medicines:                Monitored Anesthesia Care Complications:            No immediate complications. Estimated Blood Loss:     Estimated blood loss: none. Procedure:                Pre-Anesthesia Assessment:                           - Prior to the procedure, a History and Physical                            was performed, and patient medications, allergies                            and sensitivities were reviewed. The patient's                            tolerance of previous anesthesia was reviewed.                           - The risks and benefits of the procedure and the                            sedation options and risks were discussed with the                            patient. All questions were answered and informed                            consent was obtained.                           - ASA Grade Assessment: II - A patient with mild                            systemic disease.                           After obtaining informed consent, the endoscope was                            passed under direct vision. Throughout the  procedure, the patient's blood pressure, pulse, and                            oxygen saturations were monitored continuously. The                            GIF-H190 ([id number]) scope was introduced through the                            mouth, and advanced to the second part of duodenum.                            The upper GI endoscopy was accomplished without                             difficulty. The patient tolerated the procedure                            well. Scope In: 9:16:39 AM Scope Out: 9:31:16 AM Total Procedure Duration: 0 hours 14 minutes 37 seconds  Findings:      The Z-line was irregular and was found 44 cm from the incisors.      The examined esophagus was normal. A guidewire was placed and the scope       was withdrawn. Dilation was performed with a Savary dilator with no       resistance at 16 and 18 mm. No hematin was observed upon reinspection.      LA Grade B (one or more mucosal breaks greater than 5 mm, not extending       between the tops of two mucosal folds) esophagitis with no bleeding was       found at the gastroesophageal junction.      Localized mucosal changes characterized by erythema were found in the       prepyloric region of the stomach. Imaging was performed using white       light and narrow band imaging to visualize the mucosa. This area was       different from the surrounding tissue and endoscopically suspicious for       intestinal metaplasia. Biopsies were taken from body and antrum with a       cold forceps for Helicobacter pylori testing and histology.      One non-bleeding superficial duodenal ulcer with a clean ulcer base       (Forrest Class III) was found in the duodenal bulb. The lesion was 4 mm       in largest dimension. Impression:               - Z-line irregular, 44 cm from the incisors.                           - Normal esophagus. Dilated.                           - LA Grade B esophagitis with no bleeding.                           - Erythematous mucosa in the prepyloric  region of                            the stomach. Biopsied.                           - Non-bleeding duodenal ulcer with a clean ulcer                            base (Forrest Class III). Moderate Sedation:      Per Anesthesia Care Recommendation:           - Discharge patient to home (ambulatory).                           - Resume previous  diet.                           - Use Protonix (pantoprazole) 40 mg PO BID for 2                            months.                           - Await pathology results.                           - Return to GI clinic with Laurine Blazer for                            follow up.                           - No ibuprofen, naproxen, or other non-steroidal                            anti-inflammatory drugs. Procedure Code(s):        --- Professional ---                           [phone], GC, Esophagogastroduodenoscopy, flexible,                            transoral; with insertion of guide wire followed by                            passage of dilator(s) through esophagus over guide                            wire Diagnosis Code(s):        --- Professional ---                           K22.8, Other specified diseases of esophagus                           K20.90, Esophagitis, unspecified without bleeding  K31.89, Other diseases of stomach and duodenum                           K26.9, Duodenal ulcer, unspecified as acute or                            chronic, without hemorrhage or perforation                           R13.10, Dysphagia, unspecified CPT copyright 2019 American Medical Association. All rights reserved. The codes documented in this report are preliminary and upon coder review may  be revised to meet current compliance requirements. Maylon Peppers, MD Maylon Peppers,  11/09/2019 11:03:43 AM This report has been signed electronically. Number of Addenda: 0

## 2019-11-12 ENCOUNTER — Other Ambulatory Visit: Payer: Self-pay

## 2019-11-12 LAB — SURGICAL PATHOLOGY

## 2019-11-12 NOTE — Progress Notes (Signed)
I reviewed the pathology results. I called the patient and informed  about the results. Pathology showed presence of intestinal metaplasia in the antrum, repeat EGD in 3 years after discussion of the benefits and risks with the patient. Also, was found to have tubular adenomas in colon, which had a large size.  He will need to have a repeat colonoscopy in 6 months.  Thanks,  Maylon Peppers, MD Gastroenterology and Hepatology [facility]

## 2019-11-13 ENCOUNTER — Encounter (HOSPITAL_COMMUNITY): Payer: Self-pay | Admitting: Gastroenterology

## 2019-11-14 ENCOUNTER — Encounter (INDEPENDENT_AMBULATORY_CARE_PROVIDER_SITE_OTHER): Payer: Self-pay | Admitting: Nurse Practitioner

## 2019-11-14 ENCOUNTER — Ambulatory Visit (INDEPENDENT_AMBULATORY_CARE_PROVIDER_SITE_OTHER): Payer: Medicare HMO | Admitting: Nurse Practitioner

## 2019-11-14 ENCOUNTER — Other Ambulatory Visit: Payer: Self-pay

## 2019-11-14 VITALS — BP 140/60 | Temp 96.4°F | Ht 67.0 in | Wt 198.4 lb

## 2019-11-14 DIAGNOSIS — L03031 Cellulitis of right toe: Secondary | ICD-10-CM | POA: Diagnosis not present

## 2019-11-14 MED ORDER — CEPHALEXIN 500 MG PO CAPS: 500.0000 mg | ORAL_CAPSULE | Freq: Four times a day (QID) | ORAL | 0 refills | Status: DC

## 2019-11-14 MED ORDER — SULFAMETHOXAZOLE-TRIMETHOPRIM 800-160 MG PO TABS: 1.0000 | ORAL_TABLET | Freq: Two times a day (BID) | ORAL | 0 refills | Status: DC

## 2019-11-14 NOTE — Progress Notes (Signed)
Subjective:  Patient ID: CALAHAN PAK, male    DOB: 1951/01/21  Age: 69 y.o. MRN: [id number]  CC:  Chief Complaint  Patient presents with  . Toe Pain    big toe right foot      HPI  This patient lives today for an acute visit for the above.  He tells me that he has been experiencing right great toe swelling, redness, and pain for the last 2 to 3 days.  He tells me approximately 4 days ago his wife bought him a new pairs of shoes.  It was causing him discomfort and pain when walking in the shoes, and then he started experiencing the swelling.  Since then he has stopped wearing the shoes but the swelling and pain has not subsided.  He denies any open sores or drainage.  He has no history of gout.  Per chart review I see that last blood work was collected in June of this year and did show normal renal function.  He does have a history of diabetes.  Past Medical History:  Diagnosis Date  . Abnormal cardiovascular stress test 03/2013  . Anxiety   . Arthritis   . Back pain   . CHF (congestive heart failure) ([place])   . Coronary artery disease    Multivessel with significant LAD involvement by chest CT 2014  . Depression   . Diastolic dysfunction   . Essential hypertension   . Lumbar herniated disc    L4-L5  . MRSA (methicillin resistant staph aureus) culture positive   . Shortness of breath dyspnea   . Sleep apnea    No cpap  . Type 2 diabetes mellitus (HCC)       Family History  Problem Relation Age of Onset  . Lung cancer Mother   . Diabetes Father   . Hepatitis B Father   . Diabetes Sister   . Cushing syndrome Sister     Social History   Social History Narrative   Married for 5 years.Retired Living with wife.   Social History   Tobacco Use  . Smoking status: Current Every Day Smoker    Packs/day: 1.00    Years: 50.00    Pack years: 50.00    Types: Cigarettes  . Smokeless tobacco: Former Systems developer  . Tobacco comment: smoking x 50 yrs  Substance Use Topics    . Alcohol use: Yes    Alcohol/week: 1.0 standard drink    Types: 1 Standard drinks or equivalent per week    Comment: rare     Current Meds  Medication Sig  . ACCU-CHEK AVIVA PLUS test strip USE AS INSTRUCTED  . aspirin EC 81 MG tablet Take 81 mg by mouth daily.  Marland Kitchen atorvastatin (LIPITOR) 80 MG tablet TAKE 1 TABLET BY MOUTH EVERYDAY AT BEDTIME (Patient taking differently: Take 80 mg by mouth daily. )  . CALCIUM-MAGNESIUM-ZINC PO Take 1 tablet by mouth daily.  . Flaxseed, Linseed, (FLAXSEED OIL PO) Take 1 capsule by mouth daily.   . furosemide (LASIX) 40 MG tablet TAKE 1 TABLET BY MOUTH EVERY DAY (Patient taking differently: Take 40 mg by mouth daily. )  . gabapentin (NEURONTIN) 300 MG capsule Take 1 capsule (300 mg total) by mouth 2 (two) times daily.  Marland Kitchen lisinopril (ZESTRIL) 10 MG tablet Take 1 tablet (10 mg total) by mouth daily.  . metFORMIN (GLUCOPHAGE) 500 MG tablet TAKE 1 TABLET TWICE A DAY (Patient taking differently: Take 500 mg by mouth 2 (two)  times daily with a meal. )  . metoprolol succinate (TOPROL-XL) 25 MG 24 hr tablet Take 1 tablet (25 mg total) by mouth at bedtime.  . Multiple Vitamin (MULTIVITAMIN WITH MINERALS) TABS Take 1 tablet by mouth daily.  . nitroGLYCERIN (NITROSTAT) 0.4 MG SL tablet Place 1 tablet (0.4 mg total) under the tongue every 5 (five) minutes as needed for chest pain.  Marland Kitchen omeprazole (PRILOSEC) 40 MG capsule Take 1 capsule (40 mg total) by mouth 2 (two) times daily.  . tadalafil (CIALIS) 10 MG tablet Take 1 tablet (10 mg total) by mouth every other day as needed for erectile dysfunction.  Marland Kitchen testosterone cypionate (DEPO-TESTOSTERONE) 200 MG/ML injection Inject 0.5 mLs (100 mg total) into the muscle every 7 (seven) days.    ROS:  See HPI   Objective:   Today's Vitals: BP 140/60 (BP Location: Left Arm, Patient Position: Sitting)   Temp (!) 96.4 F (35.8 C) (Temporal)   Ht 5\' 7"  (1.702 m)   Wt 198 lb 6.4 oz (90 kg)   BMI 31.07 kg/m  Vitals with BMI  11/14/2019 11/09/2019 11/09/2019  Height 5\' 7"  - -  Weight 198 lbs 6 oz - -  BMI 62.83 - -  Systolic 151 761 607  Diastolic 60 70 63  Pulse - 88 94     Physical Exam Vitals reviewed.  Constitutional:      Appearance: Normal appearance.  HENT:     Head: Normocephalic and atraumatic.  Cardiovascular:     Rate and Rhythm: Normal rate.     Pulses:          Dorsalis pedis pulses are 2+ on the right side.     Heart sounds: Murmur heard.   Pulmonary:     Effort: Pulmonary effort is normal.     Breath sounds: Normal breath sounds.  Feet:     Right foot:     Skin integrity: Erythema and warmth present.     Toenail Condition: Right toenails are normal.  Neurological:     General: No focal deficit present.     Mental Status: He is alert and oriented to person, place, and time.  Psychiatric:        Mood and Affect: Mood normal.        Thought Content: Thought content normal.        Judgment: Judgment normal.          Assessment and Plan   1. Cellulitis of toe of right foot      Plan: 1.  Appears that he does have cellulitis of the great toe to his right foot.  We will treat him with Bactrim and Keflex.  He was told about possible side effects regarding these medications especially Stevens-Johnson syndrome and was told if he experiences any rash in addition to pain in any of his oral mucosa such as eyes, mouth, rectum he should stop the medicine right away and let us know.  He tells me he understands.   Tests ordered No orders of the defined types were placed in this encounter.     Meds ordered this encounter  Medications  . sulfamethoxazole-trimethoprim (BACTRIM DS) 800-160 MG tablet    Sig: Take 1 tablet by mouth 2 (two) times daily.    Dispense:  20 tablet    Refill:  0    Order Specific Question:   Supervising Provider    Answer:   Hurshel Party C [[id number]]  . cephALEXin (KEFLEX) 500 MG capsule  Sig: Take 1 capsule (500 mg total) by mouth 4 (four) times daily.      Dispense:  40 capsule    Refill:  0    Order Specific Question:   Supervising Provider    Answer:   Doree Albee [[id number]]    Patient to follow-up in 2 weeks for close follow-up of his toe.  Ailene Ards, NP

## 2019-11-15 ENCOUNTER — Encounter: Payer: Self-pay | Admitting: Emergency Medicine

## 2019-11-15 ENCOUNTER — Other Ambulatory Visit: Payer: Self-pay

## 2019-11-15 ENCOUNTER — Ambulatory Visit
Admission: EM | Admit: 2019-11-15 | Discharge: 2019-11-15 | Disposition: A | Discharge status: Home / Self Care | Payer: Medicare HMO

## 2019-11-15 NOTE — ED Triage Notes (Signed)
Pt is diabetic and has and infected RT great toe x 4 days. Pain and swelling

## 2019-11-29 ENCOUNTER — Other Ambulatory Visit: Payer: Self-pay

## 2019-11-29 ENCOUNTER — Ambulatory Visit (INDEPENDENT_AMBULATORY_CARE_PROVIDER_SITE_OTHER): Payer: Medicare HMO | Admitting: Nurse Practitioner

## 2019-11-29 ENCOUNTER — Encounter (INDEPENDENT_AMBULATORY_CARE_PROVIDER_SITE_OTHER): Payer: Self-pay | Admitting: Nurse Practitioner

## 2019-11-29 ENCOUNTER — Other Ambulatory Visit: Payer: Self-pay | Admitting: Cardiology

## 2019-11-29 DIAGNOSIS — L03031 Cellulitis of right toe: Secondary | ICD-10-CM

## 2019-11-29 MED ORDER — CEPHALEXIN 500 MG PO CAPS: 500.0000 mg | ORAL_CAPSULE | Freq: Four times a day (QID) | ORAL | 0 refills | Status: DC

## 2019-11-29 MED ORDER — SULFAMETHOXAZOLE-TRIMETHOPRIM 800-160 MG PO TABS: 1.0000 | ORAL_TABLET | Freq: Two times a day (BID) | ORAL | 0 refills | Status: DC

## 2019-11-29 MED ORDER — MUPIROCIN 2 % EX OINT: 1.0000 "application " | TOPICAL_OINTMENT | Freq: Two times a day (BID) | CUTANEOUS | 0 refills | Status: DC

## 2019-11-29 NOTE — Progress Notes (Signed)
Subjective:  Patient ID: Jonathan Gilmore, male    DOB: June 17, 1950  Age: 69 y.o. MRN: [id number]  CC:  Chief Complaint  Patient presents with  . Cellulitis      HPI  This patient arrives today for the above.  He was seen last approximately 2 weeks ago at which point he was diagnosed with cellulitis of the right great toe.  He was prescribed a course of cephalexin and Bactrim.  He tells me he has completed his antibiotics and took his last dose yesterday.  He tells me that the pain, swelling, and redness are improving.  He has an appointment with a podiatrist coming up later this week.  Past Medical History:  Diagnosis Date  . Abnormal cardiovascular stress test 03/2013  . Anxiety   . Arthritis   . Back pain   . CHF (congestive heart failure) ([place])   . Coronary artery disease    Multivessel with significant LAD involvement by chest CT 2014  . Depression   . Diastolic dysfunction   . Essential hypertension   . Lumbar herniated disc    L4-L5  . MRSA (methicillin resistant staph aureus) culture positive   . Shortness of breath dyspnea   . Sleep apnea    No cpap  . Type 2 diabetes mellitus ([facility])       Family History  Problem Relation Age of Onset  . Lung cancer Mother   . Diabetes Father   . Hepatitis B Father   . Diabetes Sister   . Cushing syndrome Sister     Social History   Social History Narrative   Married for 5 years.Retired Living with wife.   Social History   Tobacco Use  . Smoking status: Current Every Day Smoker    Packs/day: 1.00    Years: 50.00    Pack years: 50.00    Types: Cigarettes  . Smokeless tobacco: Former Systems developer  . Tobacco comment: smoking x 50 yrs  Substance Use Topics  . Alcohol use: Yes    Alcohol/week: 1.0 standard drink    Types: 1 Standard drinks or equivalent per week    Comment: rare     No outpatient medications have been marked as taking for the 11/29/19 encounter (Office Visit) with Ailene Ards, NP.    ROS:  See  HPI   Objective:   Today's Vitals: BP 114/68   Pulse 62   Temp (!) 97.2 F (36.2 C)   Resp 16   Ht 5\' 7"  (1.702 m)   Wt 198 lb 9.6 oz (90.1 kg)   SpO2 97%   BMI 31.11 kg/m  Vitals with BMI 11/29/2019 11/15/2019 11/14/2019  Height 5\' 7"  - 5\' 7"   Weight 198 lbs 10 oz - 198 lbs 6 oz  BMI 43.3 - 29.51  Systolic 884 166 063  Diastolic 68 60 60  Pulse 62 56 -     Physical Exam Vitals reviewed.  Constitutional:      General: He is not in acute distress.    Appearance: Normal appearance.  Cardiovascular:     Pulses:          Dorsalis pedis pulses are 1+ on the right side.  Pulmonary:     Effort: Pulmonary effort is normal.  Musculoskeletal:       Feet:  Feet:     Right foot:     Skin integrity: Erythema and dry skin present. No blister or warmth.  Neurological:  General: No focal deficit present.     Mental Status: He is alert and oriented to person, place, and time.  Psychiatric:        Mood and Affect: Mood normal.        Behavior: Behavior normal.        Judgment: Judgment normal.          Assessment and Plan   1. Cellulitis of toe of right foot      Plan: 1.  Toe seems to be improving, on exam there is some evidence of mild erythema.  Will send 5 additional days of Bactrim and cephalexin.   He was encouraged to follow-up with podiatry later this week, and let me know if symptoms persist or worsen.  He will follow-up as scheduled next month with Dr. Anastasio Champion.   Tests ordered No orders of the defined types were placed in this encounter.     Meds ordered this encounter  Medications  . cephALEXin (KEFLEX) 500 MG capsule    Sig: Take 1 capsule (500 mg total) by mouth 4 (four) times daily.    Dispense:  20 capsule    Refill:  0    Order Specific Question:   Supervising Provider    Answer:   Hurshel Party C [[id number]]  . sulfamethoxazole-trimethoprim (BACTRIM DS) 800-160 MG tablet    Sig: Take 1 tablet by mouth 2 (two) times daily.    Dispense:  10  tablet    Refill:  0    Order Specific Question:   Supervising Provider    Answer:   Hurshel Party C [[id number]]  . mupirocin ointment (BACTROBAN) 2 %    Sig: Apply 1 application topically 2 (two) times daily.    Dispense:  22 g    Refill:  0    Order Specific Question:   Supervising Provider    Answer:   Hurshel Party C [[id number]]     Ailene Ards, NP

## 2019-12-02 ENCOUNTER — Other Ambulatory Visit: Payer: Self-pay | Admitting: Cardiology

## 2019-12-15 ENCOUNTER — Other Ambulatory Visit: Payer: Self-pay | Admitting: Cardiology

## 2019-12-20 ENCOUNTER — Other Ambulatory Visit: Payer: Self-pay | Admitting: Family Medicine

## 2019-12-20 DIAGNOSIS — I6523 Occlusion and stenosis of bilateral carotid arteries: Secondary | ICD-10-CM

## 2019-12-20 DIAGNOSIS — Z720 Tobacco use: Secondary | ICD-10-CM

## 2020-01-02 ENCOUNTER — Ambulatory Visit (INDEPENDENT_AMBULATORY_CARE_PROVIDER_SITE_OTHER): Payer: Medicare HMO | Admitting: Internal Medicine

## 2020-01-09 ENCOUNTER — Encounter (INDEPENDENT_AMBULATORY_CARE_PROVIDER_SITE_OTHER): Payer: Self-pay | Admitting: Internal Medicine

## 2020-01-09 ENCOUNTER — Ambulatory Visit (INDEPENDENT_AMBULATORY_CARE_PROVIDER_SITE_OTHER): Payer: Medicare HMO | Admitting: Internal Medicine

## 2020-01-09 ENCOUNTER — Other Ambulatory Visit: Payer: Self-pay

## 2020-01-09 VITALS — BP 144/70 | HR 86 | Temp 97.9°F | Resp 18 | Ht 67.0 in | Wt 200.4 lb

## 2020-01-09 DIAGNOSIS — I10 Essential (primary) hypertension: Secondary | ICD-10-CM

## 2020-01-09 DIAGNOSIS — E291 Testicular hypofunction: Secondary | ICD-10-CM

## 2020-01-09 DIAGNOSIS — E1165 Type 2 diabetes mellitus with hyperglycemia: Secondary | ICD-10-CM

## 2020-01-09 MED ORDER — GABAPENTIN 300 MG PO CAPS
300.0000 mg | ORAL_CAPSULE | Freq: Two times a day (BID) | ORAL | 1 refills | Status: DC
Start: 2020-01-09 — End: 2020-05-26

## 2020-01-09 NOTE — Progress Notes (Signed)
Metrics: Intervention Frequency ACO  Documented Smoking Status Yearly  Screened one or more times in 24 months  Cessation Counseling or  Active cessation medication Past 24 months  Past 24 months   Guideline developer: UpToDate (See UpToDate for funding source) Date Released: 2014       Wellness Office Visit  Subjective:  Patient ID: Jonathan Gilmore, male    DOB: 08-25-1950  Age: 69 y.o. MRN: [id number]  CC: This man comes in for follow-up of diabetes, hypogonadism and hypertension. HPI  Unfortunately, he still continues to smoke cigarettes.  He requires a refill of his gabapentin. Testosterone therapy has really changed how he feels and he has more energy and motivation now. He continues to work on diet and his hemoglobin A1c had come down to 7.1% on the last time it was checked.  He continues with Metformin for his diabetes. He continues with statin therapy in the face of diabetes. Past Medical History:  Diagnosis Date  . Abnormal cardiovascular stress test 03/2013  . Anxiety   . Arthritis   . Back pain   . CHF (congestive heart failure) ([place])   . Coronary artery disease    Multivessel with significant LAD involvement by chest CT 2014  . Depression   . Diastolic dysfunction   . Essential hypertension   . Lumbar herniated disc    L4-L5  . MRSA (methicillin resistant staph aureus) culture positive   . Shortness of breath dyspnea   . Sleep apnea    No cpap  . Type 2 diabetes mellitus ([place])    Past Surgical History:  Procedure Laterality Date  . ANTERIOR CERVICAL DECOMP/DISCECTOMY FUSION  01/18/2011   Procedure: ANTERIOR CERVICAL DECOMPRESSION/DISCECTOMY FUSION 2 LEVELS;  Surgeon: Cooper Render Pool;  Location: [facility];  Service: Neurosurgery;  Laterality: N/A;  anterior cervical discectomy and fusion with allograft and plating, cervical five-six, cervical six-seven  . BIOPSY N/A 04/04/2015   Procedure: BIOPSY;  Surgeon: Rogene Houston, MD;  [facility];  Service:  Endoscopy;  Laterality: N/A;  . BIOPSY  11/09/2019   Procedure: BIOPSY;  Surgeon: Harvel Quale, MD;  Location: [facility];  Service: Gastroenterology;;  . Bone spur removed  1997   Left shoulder  . CARDIAC CATHETERIZATION N/A 08/08/2015   Procedure: Right/Left Heart Cath and Coronary Angiography;  Surgeon: Sherren Mocha, MD;  Location: [facility];  Service: Cardiovascular;  Laterality: N/A;  . CARPAL TUNNEL RELEASE Right   . COLONOSCOPY WITH PROPOFOL N/A 11/09/2019   Procedure: COLONOSCOPY WITH PROPOFOL;  Surgeon: Harvel Quale, MD;  Location: [facility];  Service: Gastroenterology;  Laterality: N/A;  900  . ESOPHAGEAL DILATION N/A 04/04/2015   Procedure: ESOPHAGEAL DILATION;  Surgeon: Rogene Houston, MD;  [facility];  Service: Endoscopy;  Laterality: N/A;  . ESOPHAGEAL DILATION N/A 11/09/2019   Procedure: ESOPHAGEAL DILATION;  Surgeon: Harvel Quale, MD;  [facility];  Service: Gastroenterology;  Laterality: N/A;  . ESOPHAGOGASTRODUODENOSCOPY N/A 04/04/2015   Procedure: ESOPHAGOGASTRODUODENOSCOPY (EGD);  Surgeon: Rogene Houston, MD;  [facility];  Service: Endoscopy;  Laterality: N/A;  1240  . ESOPHAGOGASTRODUODENOSCOPY (EGD) WITH PROPOFOL N/A 11/09/2019   Procedure: ESOPHAGOGASTRODUODENOSCOPY (EGD) WITH PROPOFOL;  Surgeon: Harvel Quale, MD;  [facility];  Service: Gastroenterology;  Laterality: N/A;  . HEMORRHOID SURGERY    . POLYPECTOMY  11/09/2019   Procedure: POLYPECTOMY;  Surgeon: Harvel Quale, MD;  [facility];  Service: Gastroenterology;;  . TONSILLECTOMY       Family History  Problem Relation Age of Onset  . Lung cancer Mother   . Diabetes Father   . Hepatitis B Father   . Diabetes Sister   . Cushing syndrome Sister     Social History   Social History Narrative   Married for 5 years.Retired Living with wife.   Social History   Tobacco  Use  . Smoking status: Current Every Day Smoker    Packs/day: 1.00    Years: 50.00    Pack years: 50.00    Types: Cigarettes  . Smokeless tobacco: Former Systems developer  . Tobacco comment: smoking x 50 yrs  Substance Use Topics  . Alcohol use: Yes    Alcohol/week: 1.0 standard drink    Types: 1 Standard drinks or equivalent per week    Comment: rare    Current Meds  Medication Sig  . ACCU-CHEK AVIVA PLUS test strip USE AS INSTRUCTED  . aspirin EC 81 MG tablet Take 81 mg by mouth daily.  Marland Kitchen atorvastatin (LIPITOR) 80 MG tablet Take 1 tablet (80 mg total) by mouth daily.  Marland Kitchen CALCIUM-MAGNESIUM-ZINC PO Take 1 tablet by mouth daily.  . Flaxseed, Linseed, (FLAXSEED OIL PO) Take 1 capsule by mouth daily.   . furosemide (LASIX) 40 MG tablet TAKE 1 TABLET BY MOUTH EVERY DAY (Patient taking differently: Take 40 mg by mouth daily. )  . gabapentin (NEURONTIN) 300 MG capsule Take 1 capsule (300 mg total) by mouth 2 (two) times daily.  Marland Kitchen lisinopril (ZESTRIL) 10 MG tablet TAKE 1 TABLET BY MOUTH EVERY DAY  . metFORMIN (GLUCOPHAGE) 500 MG tablet TAKE 1 TABLET TWICE A DAY (Patient taking differently: Take 500 mg by mouth 2 (two) times daily with a meal. )  . metoprolol succinate (TOPROL-XL) 25 MG 24 hr tablet TAKE 1 TABLET BY MOUTH EVERYDAY AT BEDTIME  . Multiple Vitamin (MULTIVITAMIN WITH MINERALS) TABS Take 1 tablet by mouth daily.  . mupirocin ointment (BACTROBAN) 2 % Apply 1 application topically 2 (two) times daily.  . nitroGLYCERIN (NITROSTAT) 0.4 MG SL tablet Place 1 tablet (0.4 mg total) under the tongue every 5 (five) minutes as needed for chest pain.  Marland Kitchen omeprazole (PRILOSEC) 40 MG capsule Take 1 capsule (40 mg total) by mouth 2 (two) times daily.  . tadalafil (CIALIS) 10 MG tablet Take 1 tablet (10 mg total) by mouth every other day as needed for erectile dysfunction.  Marland Kitchen testosterone cypionate (DEPO-TESTOSTERONE) 200 MG/ML injection Inject 0.5 mLs (100 mg total) into the muscle every 7 (seven) days.  .  [DISCONTINUED] cephALEXin (KEFLEX) 500 MG capsule Take 1 capsule (500 mg total) by mouth 4 (four) times daily.  . [DISCONTINUED] gabapentin (NEURONTIN) 300 MG capsule Take 1 capsule (300 mg total) by mouth 2 (two) times daily.  . [DISCONTINUED] sulfamethoxazole-trimethoprim (BACTRIM DS) 800-160 MG tablet Take 1 tablet by mouth 2 (two) times daily.      Depression screen [facility] 2/9 11/29/2018 10/25/2018  Decreased Interest 0 0  Down, Depressed, Hopeless 0 0  PHQ - 2 Score 0 0     Objective:   Today's Vitals: BP (!) 144/70   Pulse 86   Temp 97.9 F (36.6 C) (Temporal)   Resp 18   Ht 5\' 7"  (1.702 m)   Wt 200 lb 6.4 oz (90.9 kg)   SpO2 98%   BMI 31.39 kg/m  Vitals with BMI 01/09/2020 11/29/2019 11/15/2019  Height 5\' 7"  5\' 7"  -  Weight  200 lbs 6 oz 198 lbs 10 oz -  BMI 40.76 80.8 -  Systolic 811 031 594  Diastolic 70 68 60  Pulse 86 62 56     Physical Exam  His weight is fairly stable.  Blood pressure is acceptable for his age.  He is alert and orientated without any focal neurological signs.     Assessment   1. Hypogonadism, male   2. Essential hypertension, benign   3. Uncontrolled type 2 diabetes mellitus with hyperglycemia ([place])       Tests ordered No orders of the defined types were placed in this encounter.    Plan: 1. He will continue with testosterone therapy as before once a week.  He does not appear to have any major side effects. 2. He will continue with lisinopril and metoprolol for hypertension which seems to be controlling his blood pressure reasonably well. 3. He will continue with Metformin for his diabetes. 4. Follow-up in about 3 months when we will do all the blood work then.   Meds ordered this encounter  Medications  . gabapentin (NEURONTIN) 300 MG capsule    Sig: Take 1 capsule (300 mg total) by mouth 2 (two) times daily.    Dispense:  180 capsule    Refill:  1    Zaven Klemens Luther Parody, MD

## 2020-01-25 ENCOUNTER — Ambulatory Visit: Payer: Medicare HMO | Admitting: Cardiology

## 2020-01-28 ENCOUNTER — Ambulatory Visit (INDEPENDENT_AMBULATORY_CARE_PROVIDER_SITE_OTHER): Payer: Medicare HMO | Admitting: Cardiology

## 2020-01-28 ENCOUNTER — Encounter: Payer: Self-pay | Admitting: Cardiology

## 2020-01-28 VITALS — BP 140/70 | HR 70 | Ht 67.0 in | Wt 199.2 lb

## 2020-01-28 DIAGNOSIS — I10 Essential (primary) hypertension: Secondary | ICD-10-CM

## 2020-01-28 DIAGNOSIS — I350 Nonrheumatic aortic (valve) stenosis: Secondary | ICD-10-CM | POA: Diagnosis not present

## 2020-01-28 DIAGNOSIS — I5032 Chronic diastolic (congestive) heart failure: Secondary | ICD-10-CM

## 2020-01-28 DIAGNOSIS — E782 Mixed hyperlipidemia: Secondary | ICD-10-CM

## 2020-01-28 DIAGNOSIS — I2510 Atherosclerotic heart disease of native coronary artery without angina pectoris: Secondary | ICD-10-CM | POA: Diagnosis not present

## 2020-01-28 MED ORDER — NITROGLYCERIN 0.4 MG SL SUBL: SUBLINGUAL_TABLET | SUBLINGUAL | 2 refills | Status: DC

## 2020-01-28 MED ORDER — NITROGLYCERIN 0.4 MG SL SUBL: 0.4000 mg | SUBLINGUAL_TABLET | SUBLINGUAL | 2 refills | Status: DC | PRN

## 2020-01-28 NOTE — Patient Instructions (Signed)

## 2020-01-28 NOTE — Progress Notes (Signed)
Clinical Summary Jonathan Gilmore is a 69 y.o.male seen today for follow up of the following medical problems.    1. CAD - CT scan chest 02/2013 with 3 vessel CAD, severe LAD disease. - Jan 2015 nuclear stress without clear ischemia - admit 07/2015 with acute pulmonary edema in setting of severe HTN. Referred for cath.  - cath 07/2015 with 50% LAD disease, overall patent coronaries - his presentation with pulmonary edema likely secondary to severe HTN in setting of restrictive diastolic dysfunction. - echo 07/2015 LVEF 45-50%, restrictive diastolic dysfunction    -- no recent chest pain. No SOB/DOE - compliant with meds  2. Severe OSA - not using CPAP machine due to discomfort. Stopped several years ago   3. Hyperlipidemia -11/2018 TC 82 HDL 25 TG 137 LDL 35 - he is compliant with statin  - 08/2019 TC 106 HDL 37 TG 100 LDL 51  4. Aortic stenosis - echo 07/2015 mild to moderate. Meangradient 16, AVA reported at 0.9 with dimensionless index 0.17 Mar 2018 echo LVEF 60-65%, mild to moderate stenosis - denies any symptoms  5. HTN - compliant with meds   6. Chronic diastolic HF - no symtoms, controlled with lasix  7. DM2 - followed by pcp   8. Claudication/PAD - followed by vascular  9. Dysphagia - episode of food getting stuck, reports prior issues along with esophageal dilatation in the past. 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 Abnormal cardiovascular stress test 03/2013  . Anxiety   . Arthritis   . Back pain   . CHF (congestive heart failure) ([place])   . Coronary artery disease    Multivessel with significant LAD involvement by chest CT 2014  . Depression   . Diastolic dysfunction   . Essential hypertension   . Lumbar herniated disc     L4-L5  . MRSA (methicillin resistant staph aureus) culture positive   . Shortness of breath dyspnea   . Sleep apnea    No cpap  . Type 2 diabetes mellitus (HCC)      Allergies  Allergen Reactions  . Morphine And Related Nausea And Vomiting     Current Outpatient Medications  Medication Sig Dispense Refill  . ACCU-CHEK AVIVA PLUS test strip USE AS INSTRUCTED 100 strip 3  . aspirin EC 81 MG tablet Take 81 mg by mouth daily.    Marland Kitchen atorvastatin (LIPITOR) 80 MG tablet Take 1 tablet (80 mg total) by mouth daily. 90 tablet 2  . CALCIUM-MAGNESIUM-ZINC PO Take 1 tablet by mouth daily.    . Flaxseed, Linseed, (FLAXSEED OIL PO) Take 1 capsule by mouth daily.     . furosemide (LASIX) 40 MG tablet TAKE 1 TABLET BY MOUTH EVERY DAY (Patient taking differently: Take 40 mg by mouth daily. ) 90 tablet 3  . gabapentin (NEURONTIN) 300 MG capsule Take 1 capsule (300 mg total) by mouth 2 (two) times daily. 180 capsule 1  . lisinopril (ZESTRIL) 10 MG tablet TAKE 1 TABLET BY MOUTH EVERY DAY 90 tablet 3  . metFORMIN (GLUCOPHAGE) 500 MG tablet TAKE 1 TABLET TWICE A DAY (Patient taking differently: Take 500 mg by mouth 2 (two) times daily with a meal. ) 180 tablet 3  . metoprolol succinate (TOPROL-XL) 25 MG 24  hr tablet TAKE 1 TABLET BY MOUTH EVERYDAY AT BEDTIME 90 tablet 1  . Multiple Vitamin (MULTIVITAMIN WITH MINERALS) TABS Take 1 tablet by mouth daily.    . mupirocin ointment (BACTROBAN) 2 % Apply 1 application topically 2 (two) times daily. 22 g 0  . nitroGLYCERIN (NITROSTAT) 0.4 MG SL tablet Place 1 tablet (0.4 mg total) under the tongue every 5 (five) minutes as needed for chest pain. 25 tablet 3  . omeprazole (PRILOSEC) 40 MG capsule Take 1 capsule (40 mg total) by mouth 2 (two) times daily. 60 capsule 2  . tadalafil (CIALIS) 10 MG tablet Take 1 tablet (10 mg total) by mouth every other day as needed for erectile dysfunction. 10 tablet 3  . testosterone cypionate (DEPO-TESTOSTERONE) 200 MG/ML injection  Inject 0.5 mLs (100 mg total) into the muscle every 7 (seven) days. 10 mL 0   No current facility-administered medications for this visit.     Past Surgical History:  Procedure Laterality Date  . ANTERIOR CERVICAL DECOMP/DISCECTOMY FUSION  01/18/2011   Procedure: ANTERIOR CERVICAL DECOMPRESSION/DISCECTOMY FUSION 2 LEVELS;  Surgeon: Cooper Render Pool;  Location: [facility];  Service: Neurosurgery;  Laterality: N/A;  anterior cervical discectomy and fusion with allograft and plating, cervical five-six, cervical six-seven  . BIOPSY N/A 04/04/2015   Procedure: BIOPSY;  Surgeon: Rogene Houston, MD;  [facility];  Service: Endoscopy;  Laterality: N/A;  . BIOPSY  11/09/2019   Procedure: BIOPSY;  Surgeon: Harvel Quale, MD;  [facility];  Service: Gastroenterology;;  . Bone spur removed  1997   Left shoulder  . CARDIAC CATHETERIZATION N/A 08/08/2015   Procedure: Right/Left Heart Cath and Coronary Angiography;  Surgeon: Sherren Mocha, MD;  Location: [facility];  Service: Cardiovascular;  Laterality: N/A;  . CARPAL TUNNEL RELEASE Right   . COLONOSCOPY WITH PROPOFOL N/A 11/09/2019   Procedure: COLONOSCOPY WITH PROPOFOL;  Surgeon: Harvel Quale, MD;  [facility];  Service: Gastroenterology;  Laterality: N/A;  900  . ESOPHAGEAL DILATION N/A 04/04/2015   Procedure: ESOPHAGEAL DILATION;  Surgeon: Rogene Houston, MD;  [facility];  Service: Endoscopy;  Laterality: N/A;  . ESOPHAGEAL DILATION N/A 11/09/2019   Procedure: ESOPHAGEAL DILATION;  Surgeon: Harvel Quale, MD;  [facility];  Service: Gastroenterology;  Laterality: N/A;  . ESOPHAGOGASTRODUODENOSCOPY N/A 04/04/2015   Procedure: ESOPHAGOGASTRODUODENOSCOPY (EGD);  Surgeon: Rogene Houston, MD;  [facility];  Service: Endoscopy;  Laterality: N/A;  1240  . ESOPHAGOGASTRODUODENOSCOPY (EGD) WITH PROPOFOL N/A 11/09/2019   Procedure:  ESOPHAGOGASTRODUODENOSCOPY (EGD) WITH PROPOFOL;  Surgeon: Harvel Quale, MD;  [facility];  Service: Gastroenterology;  Laterality: N/A;  . HEMORRHOID SURGERY    . POLYPECTOMY  11/09/2019   Procedure: POLYPECTOMY;  Surgeon: Harvel Quale, MD;  [facility];  Service: Gastroenterology;;  . TONSILLECTOMY       Allergies  Allergen Reactions  . Morphine And Related Nausea And Vomiting      Family History  Problem Relation Age of Onset  . Lung cancer Mother   . Diabetes Father   . Hepatitis B Father   . Diabetes Sister   . Cushing syndrome Sister      Social History Jonathan Gilmore reports that he has been smoking cigarettes. He has a 50.00 pack-year smoking history. He has quit using smokeless tobacco. Jonathan Gilmore reports current alcohol use of about 1.0 standard drink of alcohol per week.  Review of Systems CONSTITUTIONAL: No weight loss, fever, chills, weakness or fatigue.  HEENT: Eyes: No visual loss, blurred vision, double vision or yellow sclerae.No hearing loss, sneezing, congestion, runny nose or sore throat.  SKIN: No rash or itching.  CARDIOVASCULAR: per hpi RESPIRATORY: No shortness of breath, cough or sputum.  GASTROINTESTINAL: No anorexia, nausea, vomiting or diarrhea. No abdominal pain or blood.  GENITOURINARY: No burning on urination, no polyuria NEUROLOGICAL: No headache, dizziness, syncope, paralysis, ataxia, numbness or tingling in the extremities. No change in bowel or bladder control.  MUSCULOSKELETAL: No muscle, back pain, joint pain or stiffness.  LYMPHATICS: No enlarged nodes. No history of splenectomy.  PSYCHIATRIC: No history of depression or anxiety.  ENDOCRINOLOGIC: No reports of sweating, cold or heat intolerance. No polyuria or polydipsia.  Marland Kitchen   Physical Examination Today's Vitals   01/28/20 1101  BP: 140/70  Pulse: 70  SpO2: 98%  Weight: 199 lb 3.2 oz (90.4 kg)  Height: 5\' 7"  (1.702 m)   Body mass  index is 31.2 kg/m.  Gen: resting comfortably, no acute distress HEENT: no scleral icterus, pupils equal round and reactive, no palptable cervical adenopathy,  CV: RRR, 2/6 sysotlic murmur rusb, no jvd Resp: Clear to auscultation bilaterally GI: abdomen is soft, non-tender, non-distended, normal bowel sounds, no hepatosplenomegaly MSK: extremities are warm, no edema.  Skin: warm, no rash Neuro:  no focal deficits Psych: appropriate affect   Diagnostic Studies Jan 2015 MPI IMPRESSION: 1. Abnormal exercise myocardial perfusion imaging stress test  2. Inferior wall defect likely due to subdiaphragmatic attenuation, its wall motion is similar to other areas without a perfusion defect.  3. Likely small anteroseptal infract, this area is more hypokinetic than the rest of the myocardium  3. Low left ventricular systolic function with global hypokinesis  4. Mildly reduced functional capacity (90% of age and gender predicted)  5. Overall increased risk for major cardiac events based on low ejection fraction. There is no current myocardium at jeopardy.   02/2013 echo Study Conclusions  - Left ventricle: The cavity size was normal. There was mild concentric hypertrophy. Systolic function was normal. The estimated ejection fraction was in the range of 55% to 60%. Wall motion was normal; there were no regional wall motion abnormalities. There is mildly asynchronous contraction consistent with intraventricular conduction delay (IVCD) or bundle Artem Bunte block. Doppler parameters are consistent with abnormal left ventricular relaxation (grade 1 diastolic dysfunction). - Aortic valve: Cusp separation was mildly reduced. There was mild to moderate stenosis. - Left atrium: The atrium was moderately dilated.  07/2015 echo  Study Conclusions  - Left ventricle: The cavity size was normal. Wall thickness was  increased in a pattern of mild LVH. Systolic  function was mildly  reduced. The estimated ejection fraction was in the range of 45%  to 50%. Diffuse hypokinesis. Doppler parameters are consistent  with restrictive physiology, indicative of decreased left  ventricular diastolic compliance and/or increased left atrial  pressure. Doppler parameters are consistent with high ventricular  filling pressure. - Aortic valve: Valve mobility was restricted. There was mild to  moderate stenosis. Valve area (VTI): 0.96 cm^2. Valve area  (Vmax): 0.91 cm^2. Valve area (Vmean): 0.89 cm^2. - Mitral valve: Calcified annulus. There was mild regurgitation. - Left atrium: The atrium was moderately dilated. - Right atrium: The atrium was mildly dilated. - Atrial septum: There was a patent foramen ovale.  Impressions:  - Mild global reduction in LV function; restrictive filling with  elevated filling pressure; mild LVH;  biatrial enlargement;  heavily calcified aortic valve with probable mild to moderate AS  (mean gradient 16 mmHg; calculated AVA .9 cm2 likely  overestimates severity; dimensionless index .3 not supportive of  severe AS); mild MR; patent foramen ovale.  07/2015 Cath  Prox LAD to Mid LAD lesion, 50% stenosed.  1. Calcified moderate proximal-mid LAD stenosis 2. Widely patent, dominant LCx 3. Preserved cardiac output, mildly elevated right-sided intracardiac pressures  Recommend: Medical therapy for nonobstructive CAD and CHF  01/2016 AAA [place] Screen No aneurysm    Assessment and Plan   1. CAD - moderate nonobstructive disease bypriorcath. -no symptoms, continue medical therapy.    2. Hyperlipidemia - LDL remains at goal, continue statin  3. Aortic stenosis - mild to moderate by most recent echo -we will repeat echo next year  4. HTN -elevated today, up and down from prior visits with other providers. Working on weight loss, dietary changes. Monitor at this time, if ongoing would increase his  lisinopril   5. Chronic diasotlic heart failure -doing well, continue lasix.   F/u 6 months    Arnoldo Lenis, M.D.

## 2020-02-22 ENCOUNTER — Other Ambulatory Visit (INDEPENDENT_AMBULATORY_CARE_PROVIDER_SITE_OTHER): Payer: Self-pay | Admitting: Internal Medicine

## 2020-02-26 ENCOUNTER — Telehealth (INDEPENDENT_AMBULATORY_CARE_PROVIDER_SITE_OTHER): Payer: Self-pay

## 2020-02-27 NOTE — Telephone Encounter (Signed)
Testosterone medication was approved by Cover my med.  CaseId:[id number];Status:Approved;Review Type:Prior Auth;Coverage Start Date:01/28/2020;Coverage End Date:02/26/2021;

## 2020-04-14 ENCOUNTER — Ambulatory Visit (INDEPENDENT_AMBULATORY_CARE_PROVIDER_SITE_OTHER): Payer: Medicare HMO | Admitting: Internal Medicine

## 2020-05-06 ENCOUNTER — Other Ambulatory Visit: Payer: Self-pay

## 2020-05-06 ENCOUNTER — Encounter (INDEPENDENT_AMBULATORY_CARE_PROVIDER_SITE_OTHER): Payer: Self-pay | Admitting: Internal Medicine

## 2020-05-06 ENCOUNTER — Ambulatory Visit (INDEPENDENT_AMBULATORY_CARE_PROVIDER_SITE_OTHER): Payer: Medicare HMO | Admitting: Internal Medicine

## 2020-05-06 VITALS — BP 146/62 | HR 67 | Temp 97.7°F | Ht 67.0 in | Wt 199.8 lb

## 2020-05-06 DIAGNOSIS — Z125 Encounter for screening for malignant neoplasm of prostate: Secondary | ICD-10-CM

## 2020-05-06 DIAGNOSIS — E1165 Type 2 diabetes mellitus with hyperglycemia: Secondary | ICD-10-CM | POA: Diagnosis not present

## 2020-05-06 DIAGNOSIS — E291 Testicular hypofunction: Secondary | ICD-10-CM

## 2020-05-06 DIAGNOSIS — I10 Essential (primary) hypertension: Secondary | ICD-10-CM | POA: Diagnosis not present

## 2020-05-06 NOTE — Progress Notes (Signed)
Metrics: Intervention Frequency ACO  Documented Smoking Status Yearly  Screened one or more times in 24 months  Cessation Counseling or  Active cessation medication Past 24 months  Past 24 months   Guideline developer: UpToDate (See UpToDate for funding source) Date Released: 2014       Wellness Office Visit  Subjective:  Patient ID: Jonathan Gilmore, male    DOB: [birth year]  Age: 70 y.o. MRN: [id number]  CC: This man comes in for follow-up of diabetes, hypertension, hypogonadism, dyslipidemia. HPI  He has three-vessel coronary artery disease from the past.  Thankfully, he has not had any significant symptoms such as chest pain or dyspnea. He continues on testosterone therapy but he describes that he feels energized for the first few days after taking injection but then the second half of the week is now very energetic for him. He continues on Metformin for his diabetes but he seems to describe low blood sugars lately. He continues on statin therapy in the face of coronary artery disease. He continues with lisinopril for hypertension. Past Medical History:  Diagnosis Date  . Abnormal cardiovascular stress test 03/2013  . Anxiety   . Arthritis   . Back pain   . CHF (congestive heart failure) ([place])   . Coronary artery disease    Multivessel with significant LAD involvement by chest CT 2014  . Depression   . Diastolic dysfunction   . Essential hypertension   . Lumbar herniated disc    L4-L5  . MRSA (methicillin resistant staph aureus) culture positive   . Shortness of breath dyspnea   . Sleep apnea    No cpap  . Type 2 diabetes mellitus ([place])    Past Surgical History:  Procedure Laterality Date  . ANTERIOR CERVICAL DECOMP/DISCECTOMY FUSION  01/18/2011   Procedure: ANTERIOR CERVICAL DECOMPRESSION/DISCECTOMY FUSION 2 LEVELS;  Surgeon: Cooper Render Pool;  Location: [facility];  Service: Neurosurgery;  Laterality: N/A;  anterior cervical discectomy and fusion with allograft and  plating, cervical five-six, cervical six-seven  . BIOPSY N/A 04/04/2015   Procedure: BIOPSY;  Surgeon: Rogene Houston, MD;  [facility];  Service: Endoscopy;  Laterality: N/A;  . BIOPSY  11/09/2019   Procedure: BIOPSY;  Surgeon: Harvel Quale, MD;  [facility];  Service: Gastroenterology;;  . Bone spur removed  1997   Left shoulder  . CARDIAC CATHETERIZATION N/A 08/08/2015   Procedure: Right/Left Heart Cath and Coronary Angiography;  Surgeon: Sherren Mocha, MD;  Location: [facility];  Service: Cardiovascular;  Laterality: N/A;  . CARPAL TUNNEL RELEASE Right   . COLONOSCOPY WITH PROPOFOL N/A 11/09/2019   Procedure: COLONOSCOPY WITH PROPOFOL;  Surgeon: Harvel Quale, MD;  [facility];  Service: Gastroenterology;  Laterality: N/A;  900  . ESOPHAGEAL DILATION N/A 04/04/2015   Procedure: ESOPHAGEAL DILATION;  Surgeon: Rogene Houston, MD;  [facility];  Service: Endoscopy;  Laterality: N/A;  . ESOPHAGEAL DILATION N/A 11/09/2019   Procedure: ESOPHAGEAL DILATION;  Surgeon: Harvel Quale, MD;  [facility];  Service: Gastroenterology;  Laterality: N/A;  . ESOPHAGOGASTRODUODENOSCOPY N/A 04/04/2015   Procedure: ESOPHAGOGASTRODUODENOSCOPY (EGD);  Surgeon: Rogene Houston, MD;  [facility];  Service: Endoscopy;  Laterality: N/A;  1240  . ESOPHAGOGASTRODUODENOSCOPY (EGD) WITH PROPOFOL N/A 11/09/2019   Procedure: ESOPHAGOGASTRODUODENOSCOPY (EGD) WITH PROPOFOL;  Surgeon: Harvel Quale, MD;  [facility];  Service: Gastroenterology;  Laterality: N/A;  . HEMORRHOID SURGERY    .  POLYPECTOMY  11/09/2019   Procedure: POLYPECTOMY;  Surgeon: Harvel Quale, MD;  [facility];  Service: Gastroenterology;;  . TONSILLECTOMY       Family History  Problem Relation Age of Onset  . Lung cancer Mother   . Diabetes Father   . Hepatitis B Father   . Diabetes Sister    . Cushing syndrome Sister     Social History   Social History Narrative   Married for 5 years.Retired Living with wife.   Social History   Tobacco Use  . Smoking status: Current Every Day Smoker    Packs/day: 1.00    Years: 50.00    Pack years: 50.00    Types: Cigarettes    Start date: 11/26/1967  . Smokeless tobacco: Former Systems developer  . Tobacco comment: smoking x 50 yrs  Substance Use Topics  . Alcohol use: Yes    Alcohol/week: 1.0 standard drink    Types: 1 Standard drinks or equivalent per week    Comment: rare    Current Meds  Medication Sig  . ACCU-CHEK AVIVA PLUS test strip USE AS INSTRUCTED  . aspirin EC 81 MG tablet Take 81 mg by mouth daily.  Marland Kitchen atorvastatin (LIPITOR) 80 MG tablet Take 1 tablet (80 mg total) by mouth daily.  Marland Kitchen CALCIUM-MAGNESIUM-ZINC PO Take 1 tablet by mouth daily.  . Flaxseed, Linseed, (FLAXSEED OIL PO) Take 1 capsule by mouth daily.  . furosemide (LASIX) 40 MG tablet TAKE 1 TABLET BY MOUTH EVERY DAY (Patient taking differently: Take 40 mg by mouth daily.)  . gabapentin (NEURONTIN) 300 MG capsule Take 1 capsule (300 mg total) by mouth 2 (two) times daily.  Marland Kitchen lisinopril (ZESTRIL) 10 MG tablet TAKE 1 TABLET BY MOUTH EVERY DAY  . metFORMIN (GLUCOPHAGE) 500 MG tablet TAKE 1 TABLET TWICE A DAY (Patient taking differently: Take 500 mg by mouth 2 (two) times daily with a meal.)  . metoprolol succinate (TOPROL-XL) 25 MG 24 hr tablet TAKE 1 TABLET BY MOUTH EVERYDAY AT BEDTIME  . Multiple Vitamin (MULTIVITAMIN WITH MINERALS) TABS Take 1 tablet by mouth daily.  . mupirocin ointment (BACTROBAN) 2 % Apply 1 application topically 2 (two) times daily.  . nitroGLYCERIN (NITROSTAT) 0.4 MG SL tablet TAKE 1 TABLET UNDER TONGUE EVERY 5 MINS FOR CHEST PAIN GO TO ED AFTER 2ND DOSE  . omeprazole (PRILOSEC) 40 MG capsule Take 1 capsule (40 mg total) by mouth 2 (two) times daily.  . tadalafil (CIALIS) 10 MG tablet Take 1 tablet (10 mg total) by mouth every other day as needed  for erectile dysfunction.  Marland Kitchen testosterone cypionate (DEPOTESTOSTERONE CYPIONATE) 200 MG/ML injection INJECT 0.5 MLS (100 MG TOTAL) INTO THE MUSCLE EVERY 7 (SEVEN) DAYS.     [place] Office Visit from 05/06/2020 in [facility]  PHQ-9 Total Score 0      Objective:   Today's Vitals: BP (!) 146/62   Pulse 67   Temp 97.7 F (36.5 C) (Temporal)   Ht 5\' 7"  (1.702 m)   Wt 199 lb 12.8 oz (90.6 kg)   SpO2 97%   BMI 31.29 kg/m  Vitals with BMI 05/06/2020 01/28/2020 01/09/2020  Height 5\' 7"  5\' 7"  5\' 7"   Weight 199 lbs 13 oz 199 lbs 3 oz 200 lbs 6 oz  BMI 31.29 10.27 25.36  Systolic 644 034 742  Diastolic 62 70 70  Pulse 67 70 86     Physical Exam   He looks systemically well.  Systolic blood pressure  is elevated.  Alert and orientated.    Assessment   1. Hypogonadism, male   2. Essential hypertension, benign   3. Uncontrolled type 2 diabetes mellitus with hyperglycemia (HCC)   4. Special screening for malignant neoplasm of prostate       Tests ordered Orders Placed This Encounter  Procedures  . COMPLETE METABOLIC PANEL WITH GFR  . Hemoglobin A1c  . PSA, Total with Reflex to PSA, Free  . Lipid panel     Plan: 1. I think we can further optimize testosterone therapy and I have asked him to take testosterone 100 mg injection intramuscular twice a week now. 2. Continue with antihypertensive medication as above and we will check renal function. 3. Continue with Metformin for his diabetes and I will check an A1c.  I wonder if we need to further de-escalate therapy for diabetes. 4. Further recommendations will depend on blood results and I will see him in 3 months time for follow-up.   No orders of the defined types were placed in this encounter.   Doree Albee, MD

## 2020-05-07 LAB — COMPLETE METABOLIC PANEL WITH GFR
AG Ratio: 1.7 (calc) (ref 1.0–2.5)
ALT: 19 U/L (ref 9–46)
AST: 18 U/L (ref 10–35)
Albumin: 4.1 g/dL (ref 3.6–5.1)
Alkaline phosphatase (APISO): 96 U/L (ref 35–144)
BUN/Creatinine Ratio: 16 (calc) (ref 6–22)
BUN: 11 mg/dL (ref 7–25)
CO2: 33 mmol/L — ABNORMAL HIGH (ref 20–32)
Calcium: 9.4 mg/dL (ref 8.6–10.3)
Chloride: 102 mmol/L (ref 98–110)
Creat: 0.68 mg/dL — ABNORMAL LOW (ref 0.70–1.25)
GFR, Est African American: 113 mL/min/{1.73_m2} (ref >=60)
GFR, Est Non African American: 97 mL/min/{1.73_m2} (ref >=60)
Globulin: 2.4 g/dL (calc) (ref 1.9–3.7)
Glucose, Bld: 214 mg/dL — ABNORMAL HIGH (ref 65–139)
Potassium: 4.3 mmol/L (ref 3.5–5.3)
Sodium: 140 mmol/L (ref 135–146)
Total Bilirubin: 0.4 mg/dL (ref 0.2–1.2)
Total Protein: 6.5 g/dL (ref 6.1–8.1)

## 2020-05-07 LAB — HEMOGLOBIN A1C
Hgb A1c MFr Bld: 7.9 % of total Hgb — ABNORMAL HIGH (ref <5.7)
Mean Plasma Glucose: 180 mg/dL
eAG (mmol/L): 10 mmol/L

## 2020-05-07 LAB — LIPID PANEL
Cholesterol: 103 mg/dL (ref <200)
HDL: 35 mg/dL — ABNORMAL LOW (ref >=40)
LDL Cholesterol (Calc): 45 mg/dL (calc)
Non-HDL Cholesterol (Calc): 68 mg/dL (calc) (ref <130)
Total CHOL/HDL Ratio: 2.9 (calc) (ref <5.0)
Triglycerides: 143 mg/dL (ref <150)

## 2020-05-07 LAB — PSA, TOTAL WITH REFLEX TO PSA, FREE: PSA, Total: 3.1 ng/mL (ref <=4.0)

## 2020-05-08 ENCOUNTER — Other Ambulatory Visit (INDEPENDENT_AMBULATORY_CARE_PROVIDER_SITE_OTHER): Payer: Self-pay | Admitting: Internal Medicine

## 2020-05-08 MED ORDER — METFORMIN HCL 500 MG PO TABS
1000.0000 mg | ORAL_TABLET | Freq: Two times a day (BID) | ORAL | 1 refills | Status: DC
Start: 2020-05-08 — End: 2020-10-17

## 2020-05-22 ENCOUNTER — Other Ambulatory Visit (INDEPENDENT_AMBULATORY_CARE_PROVIDER_SITE_OTHER): Payer: Self-pay | Admitting: Internal Medicine

## 2020-05-22 DIAGNOSIS — G4730 Sleep apnea, unspecified: Secondary | ICD-10-CM

## 2020-05-22 NOTE — Progress Notes (Signed)
ref

## 2020-05-26 ENCOUNTER — Other Ambulatory Visit (INDEPENDENT_AMBULATORY_CARE_PROVIDER_SITE_OTHER): Payer: Self-pay | Admitting: Internal Medicine

## 2020-06-13 ENCOUNTER — Other Ambulatory Visit: Payer: Self-pay | Admitting: Cardiology

## 2020-06-19 ENCOUNTER — Ambulatory Visit (INDEPENDENT_AMBULATORY_CARE_PROVIDER_SITE_OTHER): Payer: Medicare HMO | Admitting: Gastroenterology

## 2020-06-30 ENCOUNTER — Institutional Professional Consult (permissible substitution): Payer: Medicare HMO | Admitting: Neurology

## 2020-08-05 ENCOUNTER — Encounter: Payer: Self-pay | Admitting: Cardiology

## 2020-08-05 ENCOUNTER — Ambulatory Visit: Payer: Medicare HMO | Admitting: Cardiology

## 2020-08-05 VITALS — BP 142/58 | HR 60 | Ht 67.0 in | Wt 198.6 lb

## 2020-08-05 DIAGNOSIS — E782 Mixed hyperlipidemia: Secondary | ICD-10-CM | POA: Diagnosis not present

## 2020-08-05 DIAGNOSIS — I10 Essential (primary) hypertension: Secondary | ICD-10-CM | POA: Diagnosis not present

## 2020-08-05 DIAGNOSIS — I350 Nonrheumatic aortic (valve) stenosis: Secondary | ICD-10-CM | POA: Diagnosis not present

## 2020-08-05 DIAGNOSIS — I2510 Atherosclerotic heart disease of native coronary artery without angina pectoris: Secondary | ICD-10-CM

## 2020-08-05 MED ORDER — LISINOPRIL 20 MG PO TABS: 20.0000 mg | ORAL_TABLET | Freq: Every day | ORAL | 3 refills | Status: DC

## 2020-08-05 NOTE — Progress Notes (Signed)
Clinical Summary Mr. Ding is a 70 y.o.male seen today for follow up of the following medical problems.    1. CAD - CT scan chest 02/2013 with 3 vessel CAD, severe LAD disease. - Jan 2015 nuclear stress without clear ischemia - admit 07/2015 with acute pulmonary edema in setting of severe HTN. Referred for cath.  - cath 07/2015 with 50% LAD disease, overall patent coronaries - his presentation with pulmonary edema likely secondary to severe HTN in setting of restrictive diastolic dysfunction. - echo 07/2015 LVEF 45-50%, restrictive diastolic dysfunction    - no recent chest pain, no SOB/DOE - compliant with meds  2. Severe OSA - not using CPAP machine due to discomfort. Stopped several years ago   3. Hyperlipidemia -11/2018 TC 82 HDL 25 TG 137 LDL 35 -he is compliant with statin  - 08/2019 TC 106 HDL 37 TG 100 LDL 51  - 04/2020 TC 103 HDL 35 TG 143 LDL 45  4. Aortic stenosis - echo 07/2015 mild to moderate. Meangradient 16, AVA reported at 0.9 with dimensionless index 0.17 Mar 2018 echo LVEF 60-65%, mild to moderate stenosis -denies any symptoms  5. HTN - compliant with meds   6. Chronic diastolic HF - occasional edema  7. DM2 - followed by pcp   8. Claudication/PAD - followed by vascular  9. Dysphagia - episode of food getting stuck, reports prior issues along with esophageal dilatation in the past. 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 Abnormal cardiovascular stress test 03/2013  . Anxiety   . Arthritis   . Back pain   . CHF (congestive heart failure) ([place])   . Coronary artery disease    Multivessel with significant LAD involvement by chest CT 2014  . Depression   . Diastolic dysfunction    . Essential hypertension   . Lumbar herniated disc    L4-L5  . MRSA (methicillin resistant staph aureus) culture positive   . Shortness of breath dyspnea   . Sleep apnea    No cpap  . Type 2 diabetes mellitus (H[facility])      Allergies  Allergen Reactions  . Morphine And Related Nausea And Vomiting     Current Outpatient Medications  Medication Sig Dispense Refill  . ACCU-CHEK AVIVA PLUS test strip USE AS INSTRUCTED 100 strip 3  . aspirin EC 81 MG tablet Take 81 mg by mouth daily.    Marland Kitchen atorvastatin (LIPITOR) 80 MG tablet Take 1 tablet (80 mg total) by mouth daily. 90 tablet 2  . CALCIUM-MAGNESIUM-ZINC PO Take 1 tablet by mouth daily.    . Flaxseed, Linseed, (FLAXSEED OIL PO) Take 1 capsule by mouth daily.    . furosemide (LASIX) 40 MG tablet Take 1 tablet (40 mg total) by mouth daily. 90 tablet 3  . gabapentin (NEURONTIN) 300 MG capsule TAKE 1 CAPSULE BY MOUTH TWICE A DAY 180 capsule 1  . lisinopril (ZESTRIL) 10 MG tablet TAKE 1 TABLET BY MOUTH EVERY DAY 90 tablet 3  . metFORMIN (GLUCOPHAGE) 500 MG tablet Take 2 tablets (1,000 mg total) by mouth 2 (two) times daily. 360 tablet 1  . metoprolol succinate (TOPROL-XL) 25 MG 24 hr tablet TAKE 1 TABLET BY MOUTH EVERYDAY AT BEDTIME  90 tablet 3  . Multiple Vitamin (MULTIVITAMIN WITH MINERALS) TABS Take 1 tablet by mouth daily.    . mupirocin ointment (BACTROBAN) 2 % Apply 1 application topically 2 (two) times daily. 22 g 0  . nitroGLYCERIN (NITROSTAT) 0.4 MG SL tablet TAKE 1 TABLET UNDER TONGUE EVERY 5 MINS FOR CHEST PAIN GO TO ED AFTER 2ND DOSE 25 tablet 2  . omeprazole (PRILOSEC) 40 MG capsule Take 1 capsule (40 mg total) by mouth 2 (two) times daily. 60 capsule 2  . tadalafil (CIALIS) 10 MG tablet Take 1 tablet (10 mg total) by mouth every other day as needed for erectile dysfunction. 10 tablet 3  . testosterone cypionate (DEPOTESTOSTERONE CYPIONATE) 200 MG/ML injection INJECT 0.5 MLS (100 MG TOTAL) INTO THE MUSCLE EVERY 7 (SEVEN) DAYS. 4 mL  2   No current facility-administered medications for this visit.     Past Surgical History:  Procedure Laterality Date  . ANTERIOR CERVICAL DECOMP/DISCECTOMY FUSION  01/18/2011   Procedure: ANTERIOR CERVICAL DECOMPRESSION/DISCECTOMY FUSION 2 LEVELS;  Surgeon: Cooper Render Pool;  Location: [place] [facility];  Service: Neurosurgery;  Laterality: N/A;  anterior cervical discectomy and fusion with allograft and plating, cervical five-six, cervical six-seven  . BIOPSY N/A 04/04/2015   Procedure: BIOPSY;  Surgeon: Rogene Houston, MD;  [facility];  Service: Endoscopy;  Laterality: N/A;  . BIOPSY  11/09/2019   Procedure: BIOPSY;  Surgeon: Harvel Quale, MD;  [facility];  Service: Gastroenterology;;  . Bone spur removed  1997   Left shoulder  . CARDIAC CATHETERIZATION N/A 08/08/2015   Procedure: Right/Left Heart Cath and Coronary Angiography;  Surgeon: Sherren Mocha, MD;  Location: [place] [facility];  Service: Cardiovascular;  Laterality: N/A;  . CARPAL TUNNEL RELEASE Right   . COLONOSCOPY WITH PROPOFOL N/A 11/09/2019   Procedure: COLONOSCOPY WITH PROPOFOL;  Surgeon: Harvel Quale, MD;  [facility];  Service: Gastroenterology;  Laterality: N/A;  900  . ESOPHAGEAL DILATION N/A 04/04/2015   Procedure: ESOPHAGEAL DILATION;  Surgeon: Rogene Houston, MD;  [facility];  Service: Endoscopy;  Laterality: N/A;  . ESOPHAGEAL DILATION N/A 11/09/2019   Procedure: ESOPHAGEAL DILATION;  Surgeon: Harvel Quale, MD;  [facility];  Service: Gastroenterology;  Laterality: N/A;  . ESOPHAGOGASTRODUODENOSCOPY N/A 04/04/2015   Procedure: ESOPHAGOGASTRODUODENOSCOPY (EGD);  Surgeon: Rogene Houston, MD;  [facility];  Service: Endoscopy;  Laterality: N/A;  1240  . ESOPHAGOGASTRODUODENOSCOPY (EGD) WITH PROPOFOL N/A 11/09/2019   Procedure: ESOPHAGOGASTRODUODENOSCOPY (EGD) WITH PROPOFOL;  Surgeon: Harvel Quale, MD;   [facility];  Service: Gastroenterology;  Laterality: N/A;  . HEMORRHOID SURGERY    . POLYPECTOMY  11/09/2019   Procedure: POLYPECTOMY;  Surgeon: Harvel Quale, MD;  [facility];  Service: Gastroenterology;;  . TONSILLECTOMY       Allergies  Allergen Reactions  . Morphine And Related Nausea And Vomiting      Family History  Problem Relation Age of Onset  . Lung cancer Mother   . Diabetes Father   . Hepatitis B Father   . Diabetes Sister   . Cushing syndrome Sister      Social History Mr. Steffenhagen reports that he has been smoking cigarettes. He started smoking about 52 years ago. He has a 50.00 pack-year smoking history. He has quit using smokeless tobacco. Mr. Candelaria reports current alcohol use of about 1.0 standard drink of alcohol per week.   Review of  Systems CONSTITUTIONAL: No weight loss, fever, chills, weakness or fatigue.  HEENT: Eyes: No visual loss, blurred vision, double vision or yellow sclerae.No hearing loss, sneezing, congestion, runny nose or sore throat.  SKIN: No rash or itching.  CARDIOVASCULAR: per hpi RESPIRATORY: No shortness of breath, cough or sputum.  GASTROINTESTINAL: No anorexia, nausea, vomiting or diarrhea. No abdominal pain or blood.  GENITOURINARY: No burning on urination, no polyuria NEUROLOGICAL: No headache, dizziness, syncope, paralysis, ataxia, numbness or tingling in the extremities. No change in bowel or bladder control.  MUSCULOSKELETAL: No muscle, back pain, joint pain or stiffness.  LYMPHATICS: No enlarged nodes. No history of splenectomy.  PSYCHIATRIC: No history of depression or anxiety.  ENDOCRINOLOGIC: No reports of sweating, cold or heat intolerance. No polyuria or polydipsia.  Marland Kitchen   Physical Examination Today's Vitals   08/05/20 1017  BP: (!) 142/58  Pulse: 60  SpO2: 98%  Weight: 198 lb 9.6 oz (90.1 kg)  Height: 5\' 7"  (1.702 m)   Body mass index is 31.11 kg/m.;  Gen: resting comfortably,  no acute distress HEENT: no scleral icterus, pupils equal round and reactive, no palptable cervical adenopathy,  CV: RRR, 3/6 systolic murmur RUSB Resp: Clear to auscultation bilaterally GI: abdomen is soft, non-tender, non-distended, normal bowel sounds, no hepatosplenomegaly MSK: extremities are warm, no edema.  Skin: warm, no rash Neuro:  no focal deficits Psych: appropriate affect   Diagnostic Studies Jan 2015 MPI IMPRESSION: 1. Abnormal exercise myocardial perfusion imaging stress test  2. Inferior wall defect likely due to subdiaphragmatic attenuation, its wall motion is similar to other areas without a perfusion defect.  3. Likely small anteroseptal infract, this area is more hypokinetic than the rest of the myocardium  3. Low left ventricular systolic function with global hypokinesis  4. Mildly reduced functional capacity (90% of age and gender predicted)  5. Overall increased risk for major cardiac events based on low ejection fraction. There is no current myocardium at jeopardy.   02/2013 echo Study Conclusions  - Left ventricle: The cavity size was normal. There was mild concentric hypertrophy. Systolic function was normal. The estimated ejection fraction was in the range of 55% to 60%. Wall motion was normal; there were no regional wall motion abnormalities. There is mildly asynchronous contraction consistent with intraventricular conduction delay (IVCD) or bundle Laurence Folz block. Doppler parameters are consistent with abnormal left ventricular relaxation (grade 1 diastolic dysfunction). - Aortic valve: Cusp separation was mildly reduced. There was mild to moderate stenosis. - Left atrium: The atrium was moderately dilated.  07/2015 echo  Study Conclusions  - Left ventricle: The cavity size was normal. Wall thickness was  increased in a pattern of mild LVH. Systolic function was mildly  reduced. The estimated ejection  fraction was in the range of 45%  to 50%. Diffuse hypokinesis. Doppler parameters are consistent  with restrictive physiology, indicative of decreased left  ventricular diastolic compliance and/or increased left atrial  pressure. Doppler parameters are consistent with high ventricular  filling pressure. - Aortic valve: Valve mobility was restricted. There was mild to  moderate stenosis. Valve area (VTI): 0.96 cm^2. Valve area  (Vmax): 0.91 cm^2. Valve area (Vmean): 0.89 cm^2. - Mitral valve: Calcified annulus. There was mild regurgitation. - Left atrium: The atrium was moderately dilated. - Right atrium: The atrium was mildly dilated. - Atrial septum: There was a patent foramen ovale.  Impressions:  - Mild global reduction in LV function; restrictive filling with  elevated filling pressure; mild LVH; biatrial enlargement;  heavily calcified aortic valve with probable mild to moderate AS  (mean gradient 16 mmHg; calculated AVA .9 cm2 likely  overestimates severity; dimensionless index .3 not supportive of  severe AS); mild MR; patent foramen ovale.  07/2015 Cath  Prox LAD to Mid LAD lesion, 50% stenosed.  1. Calcified moderate proximal-mid LAD stenosis 2. Widely patent, dominant LCx 3. Preserved cardiac output, mildly elevated right-sided intracardiac pressures  Recommend: Medical therapy for nonobstructive CAD and CHF  01/2016 AAA [place] Screen No aneurysm    Assessment and Plan  1. CAD - moderate nonobstructive disease bypriorcath. Denies any symptoms, continue current meds  - EKG shows SR, no ischemic changes   2. Hyperlipidemia -has been at goal, continue current meds  3. Aortic stenosis - mild to moderate by most recent echo -repeat echo  4. HTN -elevated today, increase lisinopril to 20mg  daily  5. Chronic diasotlic heart failure -euvolemic without symptoms, continue diuretic  F/u 6 months   Arnoldo Lenis, M.D.

## 2020-08-05 NOTE — Patient Instructions (Addendum)
Medication Instructions:   Increase Lisinopril to 20mg  daily.   Continue all other medications.    Labwork: none  Testing/Procedures:  Your physician has requested that you have an echocardiogram. Echocardiography is a painless test that uses sound waves to create images of your heart. It provides your doctor with information about the size and shape of your heart and how well your heart's chambers and valves are working. This procedure takes approximately one hour. There are no restrictions for this procedure.  Office will contact with results via phone or letter.    Follow-Up: 6 months   Any Other Special Instructions Will Be Listed Below (If Applicable).  If you need a refill on your cardiac medications before your next appointment, please call your pharmacy.

## 2020-08-06 ENCOUNTER — Ambulatory Visit (INDEPENDENT_AMBULATORY_CARE_PROVIDER_SITE_OTHER): Payer: Medicare HMO | Admitting: Internal Medicine

## 2020-08-06 ENCOUNTER — Other Ambulatory Visit (INDEPENDENT_AMBULATORY_CARE_PROVIDER_SITE_OTHER): Payer: Self-pay | Admitting: Internal Medicine

## 2020-08-07 ENCOUNTER — Institutional Professional Consult (permissible substitution): Payer: Medicare HMO | Admitting: Neurology

## 2020-08-08 ENCOUNTER — Encounter: Payer: Self-pay | Admitting: Neurology

## 2020-08-13 ENCOUNTER — Other Ambulatory Visit: Payer: Self-pay

## 2020-08-13 ENCOUNTER — Ambulatory Visit (INDEPENDENT_AMBULATORY_CARE_PROVIDER_SITE_OTHER): Payer: Medicare HMO | Admitting: Internal Medicine

## 2020-08-13 ENCOUNTER — Encounter (INDEPENDENT_AMBULATORY_CARE_PROVIDER_SITE_OTHER): Payer: Self-pay | Admitting: Internal Medicine

## 2020-08-13 VITALS — BP 126/64 | HR 65 | Temp 97.1°F | Resp 18 | Ht 67.0 in | Wt 191.8 lb

## 2020-08-13 DIAGNOSIS — E291 Testicular hypofunction: Secondary | ICD-10-CM | POA: Diagnosis not present

## 2020-08-13 DIAGNOSIS — E1165 Type 2 diabetes mellitus with hyperglycemia: Secondary | ICD-10-CM

## 2020-08-13 DIAGNOSIS — I6523 Occlusion and stenosis of bilateral carotid arteries: Secondary | ICD-10-CM

## 2020-08-13 DIAGNOSIS — I10 Essential (primary) hypertension: Secondary | ICD-10-CM | POA: Diagnosis not present

## 2020-08-13 DIAGNOSIS — R413 Other amnesia: Secondary | ICD-10-CM

## 2020-08-13 NOTE — Progress Notes (Signed)
Pt is doing good.

## 2020-08-13 NOTE — Progress Notes (Signed)
Metrics: Intervention Frequency ACO  Documented Smoking Status Yearly  Screened one or more times in 24 months  Cessation Counseling or  Active cessation medication Past 24 months  Past 24 months   Guideline developer: UpToDate (See UpToDate for funding source) Date Released: 2014       Wellness Office Visit  Subjective:  Patient ID: Jonathan Gilmore, male    DOB: 1951/01/29  Age: 70 y.o. MRN: [id number]  CC: This man comes in for follow-up of diabetes, hypertension, hypogonadism and bilateral carotid artery stenosis. HPI  He tells me that 1 month ago, he had a sudden episode which lasted less than 30 seconds where he completely forgot where he was.  This has not happened to him ever again and it was somewhat frightening. On closer questioning, he tells me he does tend to forget things but he tends to eventually remember them also. He feels better on taking testosterone therapy twice a week except he has not taken it for the last week this time. He has tolerated the higher dose of metformin compared to the last visit.  He says his blood glucose levels are improving. Past Medical History:  Diagnosis Date  . Abnormal cardiovascular stress test 03/2013  . Anxiety   . Arthritis   . Back pain   . CHF (congestive heart failure) ([place])   . Coronary artery disease    Multivessel with significant LAD involvement by chest CT 2014  . Depression   . Diastolic dysfunction   . Essential hypertension   . Lumbar herniated disc    L4-L5  . MRSA (methicillin resistant staph aureus) culture positive   . Shortness of breath dyspnea   . Sleep apnea    No cpap  . Type 2 diabetes mellitus ([place])    Past Surgical History:  Procedure Laterality Date  . ANTERIOR CERVICAL DECOMP/DISCECTOMY FUSION  01/18/2011   Procedure: ANTERIOR CERVICAL DECOMPRESSION/DISCECTOMY FUSION 2 LEVELS;  Surgeon: Cooper Render Pool;  Location: [place] [facility];  Service: Neurosurgery;  Laterality: N/A;  anterior cervical discectomy and  fusion with allograft and plating, cervical five-six, cervical six-seven  . BIOPSY N/A 04/04/2015   Procedure: BIOPSY;  Surgeon: Rogene Houston, MD;  [facility];  Service: Endoscopy;  Laterality: N/A;  . BIOPSY  11/09/2019   Procedure: BIOPSY;  Surgeon: Harvel Quale, MD;  [facility];  Service: Gastroenterology;;  . Bone spur removed  1997   Left shoulder  . CARDIAC CATHETERIZATION N/A 08/08/2015   Procedure: Right/Left Heart Cath and Coronary Angiography;  Surgeon: Sherren Mocha, MD;  Location: [place] [facility];  Service: Cardiovascular;  Laterality: N/A;  . CARPAL TUNNEL RELEASE Right   . COLONOSCOPY WITH PROPOFOL N/A 11/09/2019   Procedure: COLONOSCOPY WITH PROPOFOL;  Surgeon: Harvel Quale, MD;  [facility];  Service: Gastroenterology;  Laterality: N/A;  900  . ESOPHAGEAL DILATION N/A 04/04/2015   Procedure: ESOPHAGEAL DILATION;  Surgeon: Rogene Houston, MD;  [facility];  Service: Endoscopy;  Laterality: N/A;  . ESOPHAGEAL DILATION N/A 11/09/2019   Procedure: ESOPHAGEAL DILATION;  Surgeon: Harvel Quale, MD;  [facility];  Service: Gastroenterology;  Laterality: N/A;  . ESOPHAGOGASTRODUODENOSCOPY N/A 04/04/2015   Procedure: ESOPHAGOGASTRODUODENOSCOPY (EGD);  Surgeon: Rogene Houston, MD;  [facility];  Service: Endoscopy;  Laterality: N/A;  1240  . ESOPHAGOGASTRODUODENOSCOPY (EGD) WITH PROPOFOL N/A 11/09/2019   Procedure: ESOPHAGOGASTRODUODENOSCOPY (EGD) WITH PROPOFOL;  Surgeon: Harvel Quale, MD;  [facility];  Service: Gastroenterology;  Laterality: N/A;  . HEMORRHOID SURGERY    . POLYPECTOMY  11/09/2019   Procedure: POLYPECTOMY;  Surgeon: Harvel Quale, MD;  [facility];  Service: Gastroenterology;;  . TONSILLECTOMY       Family History  Problem Relation Age of Onset  . Lung cancer Mother   . Diabetes Father   . Hepatitis B  Father   . Diabetes Sister   . Cushing syndrome Sister     Social History   Social History Narrative   Married for 5 years.Retired Living with wife.   Social History   Tobacco Use  . Smoking status: Current Every Day Smoker    Packs/day: 1.00    Years: 50.00    Pack years: 50.00    Types: Cigarettes    Start date: 11/26/1967  . Smokeless tobacco: Former Systems developer  . Tobacco comment: smoking x 50 yrs  Substance Use Topics  . Alcohol use: Yes    Alcohol/week: 1.0 standard drink    Types: 1 Standard drinks or equivalent per week    Comment: rare    Current Meds  Medication Sig  . ACCU-CHEK AVIVA PLUS test strip USE AS INSTRUCTED  . aspirin EC 81 MG tablet Take 81 mg by mouth daily.  Marland Kitchen atorvastatin (LIPITOR) 80 MG tablet Take 1 tablet (80 mg total) by mouth daily.  Marland Kitchen CALCIUM-MAGNESIUM-ZINC PO Take 1 tablet by mouth daily.  . Flaxseed, Linseed, (FLAXSEED OIL PO) Take 1 capsule by mouth daily.  . furosemide (LASIX) 40 MG tablet Take 1 tablet (40 mg total) by mouth daily.  Marland Kitchen gabapentin (NEURONTIN) 300 MG capsule TAKE 1 CAPSULE BY MOUTH TWICE A DAY  . lisinopril (ZESTRIL) 20 MG tablet Take 1 tablet (20 mg total) by mouth daily.  . metFORMIN (GLUCOPHAGE) 500 MG tablet Take 2 tablets (1,000 mg total) by mouth 2 (two) times daily.  . metoprolol succinate (TOPROL-XL) 25 MG 24 hr tablet TAKE 1 TABLET BY MOUTH EVERYDAY AT BEDTIME  . Multiple Vitamin (MULTIVITAMIN WITH MINERALS) TABS Take 1 tablet by mouth daily.  . mupirocin ointment (BACTROBAN) 2 % Apply 1 application topically 2 (two) times daily.  . nitroGLYCERIN (NITROSTAT) 0.4 MG SL tablet TAKE 1 TABLET UNDER TONGUE EVERY 5 MINS FOR CHEST PAIN GO TO ED AFTER 2ND DOSE  . testosterone cypionate (DEPOTESTOSTERONE CYPIONATE) 200 MG/ML injection INJECT 0.5 MLS (100 MG TOTAL) INTO THE MUSCLE EVERY 7 (SEVEN) DAYS.     [place] Office Visit from 05/06/2020 in [facility]  PHQ-9 Total Score 0      Objective:   Today's  Vitals: BP 126/64 (BP Location: Right Arm, Patient Position: Sitting, Cuff Size: Normal)   Pulse 65   Temp (!) 97.1 F (36.2 C) (Temporal)   Resp 18   Ht 5\' 7"  (1.702 m)   Wt 191 lb 12.8 oz (87 kg)   SpO2 98%   BMI 30.04 kg/m  Vitals with BMI 08/13/2020 08/05/2020 05/06/2020  Height 5\' 7"  5\' 7"  5\' 7"   Weight 191 lbs 13 oz 198 lbs 10 oz 199 lbs 13 oz  BMI 30.03 45.8 09.98  Systolic 338 250 539  Diastolic 64 58 62  Pulse 65 60 67     Physical Exam  He looks older than his stated age.  He has lost 7 pounds according to our numbers since in the last week only.  Blood pressure is under very good control.  He has no focal neurological  signs.  He is alert and orientated.     Assessment   1. Uncontrolled type 2 diabetes mellitus with hyperglycemia ([place])   2. Essential hypertension, benign   3. Hypogonadism, male   4. Bilateral carotid artery stenosis   5. Memory loss       Tests ordered Orders Placed This Encounter  Procedures  . US Carotid Bilateral  . Basic metabolic panel  . Hemoglobin A1c     Plan: 1. I am somewhat worried about the acute confusion/memory loss that occurred a month ago.  I recommended to the patient MRI brain scan but he is hesitant because he gets claustrophobic.  I have told him that we can give him antianxiety medicine so that he can tolerate the procedure.  He will think about it. 2. In the past, in 2018, he had bilateral carotid artery stenosis, less than 50% stenosis and this will need to be followed up so I am ordering an ultrasound of the carotids now. 3. Continue with metformin for his diabetes and we will check an A1c today. 4. Continue with metoprolol for hypertension and lisinopril also and we will check renal function. 5. Follow-up in about 3 months.   No orders of the defined types were placed in this encounter.   Doree Albee, MD

## 2020-08-14 LAB — HEMOGLOBIN A1C
Hgb A1c MFr Bld: 7.1 % of total Hgb — ABNORMAL HIGH (ref <5.7)
Mean Plasma Glucose: 157 mg/dL
eAG (mmol/L): 8.7 mmol/L

## 2020-08-14 LAB — BASIC METABOLIC PANEL
BUN: 10 mg/dL (ref 7–25)
CO2: 28 mmol/L (ref 20–32)
Calcium: 8.7 mg/dL (ref 8.6–10.3)
Chloride: 106 mmol/L (ref 98–110)
Creat: 0.7 mg/dL (ref 0.70–1.25)
Glucose, Bld: 163 mg/dL — ABNORMAL HIGH (ref 65–99)
Potassium: 4.2 mmol/L (ref 3.5–5.3)
Sodium: 141 mmol/L (ref 135–146)

## 2020-08-20 ENCOUNTER — Other Ambulatory Visit: Payer: Self-pay | Admitting: Cardiology

## 2020-08-21 ENCOUNTER — Other Ambulatory Visit (INDEPENDENT_AMBULATORY_CARE_PROVIDER_SITE_OTHER): Payer: Self-pay | Admitting: Internal Medicine

## 2020-08-21 ENCOUNTER — Encounter (INDEPENDENT_AMBULATORY_CARE_PROVIDER_SITE_OTHER): Payer: Self-pay | Admitting: Internal Medicine

## 2020-08-21 MED ORDER — AMOXICILLIN-POT CLAVULANATE 875-125 MG PO TABS: 1.0000 | ORAL_TABLET | Freq: Two times a day (BID) | ORAL | 0 refills | Status: DC

## 2020-08-26 ENCOUNTER — Ambulatory Visit (INDEPENDENT_AMBULATORY_CARE_PROVIDER_SITE_OTHER): Payer: Medicare HMO | Admitting: Internal Medicine

## 2020-08-26 ENCOUNTER — Other Ambulatory Visit: Payer: Self-pay

## 2020-08-26 ENCOUNTER — Encounter (INDEPENDENT_AMBULATORY_CARE_PROVIDER_SITE_OTHER): Payer: Self-pay | Admitting: Internal Medicine

## 2020-08-26 VITALS — BP 128/56 | HR 72 | Temp 97.7°F | Ht 67.0 in | Wt 187.8 lb

## 2020-08-26 DIAGNOSIS — B028 Zoster with other complications: Secondary | ICD-10-CM

## 2020-08-26 DIAGNOSIS — H66002 Acute suppurative otitis media without spontaneous rupture of ear drum, left ear: Secondary | ICD-10-CM | POA: Diagnosis not present

## 2020-08-26 DIAGNOSIS — R413 Other amnesia: Secondary | ICD-10-CM | POA: Diagnosis not present

## 2020-08-26 MED ORDER — VALACYCLOVIR HCL 1 G PO TABS: 1000.0000 mg | ORAL_TABLET | Freq: Two times a day (BID) | ORAL | 0 refills | Status: AC

## 2020-08-26 MED ORDER — NEOMYCIN-POLYMYXIN-HC 1 % OT SOLN: 3.0000 [drp] | Freq: Four times a day (QID) | OTIC | 1 refills | Status: DC

## 2020-08-26 NOTE — Progress Notes (Signed)
Metrics: Intervention Frequency ACO  Documented Smoking Status Yearly  Screened one or more times in 24 months  Cessation Counseling or  Active cessation medication Past 24 months  Past 24 months   Guideline developer: UpToDate (See UpToDate for funding source) Date Released: 2014       Wellness Office Visit  Subjective:  Patient ID: Jonathan Gilmore, male    DOB: 24-Aug-1950  Age: 70 y.o. MRN: [id number]  CC: Right facial rash.  Right earache and deafness. HPI  This man comes in with the above symptoms.  The rash started approximately 5 days ago -it seems to be blistering in nature. Is also complaining of right earache and deafness. He is already taking Augmentin based on his symptoms that I heard about from his wife. Underlying all this he has had memory problems and I did encourage him to get an MRI brain scan and now he is agreeable. Past Medical History:  Diagnosis Date   Abnormal cardiovascular stress test 03/2013   Anxiety    Arthritis    Back pain    CHF (congestive heart failure) (HCC)    Coronary artery disease    Multivessel with significant LAD involvement by chest CT 2014   Depression    Diastolic dysfunction    Essential hypertension    Lumbar herniated disc    L4-L5   MRSA (methicillin resistant staph aureus) culture positive    Shortness of breath dyspnea    Sleep apnea    No cpap   Type 2 diabetes mellitus [facility])    Past Surgical History:  Procedure Laterality Date   ANTERIOR CERVICAL DECOMP/DISCECTOMY FUSION  01/18/2011   Procedure: ANTERIOR CERVICAL DECOMPRESSION/DISCECTOMY FUSION 2 LEVELS;  Surgeon: Cooper Render Pool;  Location: [place] [facility];  Service: Neurosurgery;  Laterality: N/A;  anterior cervical discectomy and fusion with allograft and plating, cervical five-six, cervical six-seven   BIOPSY N/A 04/04/2015   Procedure: BIOPSY;  Surgeon: Rogene Houston, MD;  [facility];  Service: Endoscopy;  Laterality: N/A;   BIOPSY  11/09/2019   Procedure:  BIOPSY;  Surgeon: Harvel Quale, MD;  [facility];  Service: Gastroenterology;;   Bone spur removed  1997   Left shoulder   CARDIAC CATHETERIZATION N/A 08/08/2015   Procedure: Right/Left Heart Cath and Coronary Angiography;  Surgeon: Sherren Mocha, MD;  Location: [place] [facility];  Service: Cardiovascular;  Laterality: N/A;   CARPAL TUNNEL RELEASE Right    COLONOSCOPY WITH PROPOFOL N/A 11/09/2019   Procedure: COLONOSCOPY WITH PROPOFOL;  Surgeon: Harvel Quale, MD;  [facility];  Service: Gastroenterology;  Laterality: N/A;  900   ESOPHAGEAL DILATION N/A 04/04/2015   Procedure: ESOPHAGEAL DILATION;  Surgeon: Rogene Houston, MD;  [facility];  Service: Endoscopy;  Laterality: N/A;   ESOPHAGEAL DILATION N/A 11/09/2019   Procedure: ESOPHAGEAL DILATION;  Surgeon: Harvel Quale, MD;  [facility];  Service: Gastroenterology;  Laterality: N/A;   ESOPHAGOGASTRODUODENOSCOPY N/A 04/04/2015   Procedure: ESOPHAGOGASTRODUODENOSCOPY (EGD);  Surgeon: Rogene Houston, MD;  [facility];  Service: Endoscopy;  Laterality: N/A;  1240   ESOPHAGOGASTRODUODENOSCOPY (EGD) WITH PROPOFOL N/A 11/09/2019   Procedure: ESOPHAGOGASTRODUODENOSCOPY (EGD) WITH PROPOFOL;  Surgeon: Harvel Quale, MD;  [facility];  Service: Gastroenterology;  Laterality: N/A;   HEMORRHOID SURGERY     POLYPECTOMY  11/09/2019   Procedure: POLYPECTOMY;  Surgeon: Harvel Quale, MD;  [facility];  Service: Gastroenterology;;  TONSILLECTOMY       Family History  Problem Relation Age of Onset   Lung cancer Mother    Diabetes Father    Hepatitis B Father    Diabetes Sister    Cushing syndrome Sister     Social History   Social History Narrative   Married for 5 years.Retired Living with wife.   Social History   Tobacco Use   Smoking status: Every Day    Packs/day: 1.00    Years: 50.00    Pack  years: 50.00    Types: Cigarettes    Start date: 11/26/1967   Smokeless tobacco: Former   Tobacco comments:    smoking x 50 yrs  Substance Use Topics   Alcohol use: Yes    Alcohol/week: 1.0 standard drink    Types: 1 Standard drinks or equivalent per week    Comment: rare    Current Meds  Medication Sig   ACCU-CHEK AVIVA PLUS test strip USE AS INSTRUCTED   amoxicillin-clavulanate (AUGMENTIN) 875-125 MG tablet Take 1 tablet by mouth 2 (two) times daily.   aspirin EC 81 MG tablet Take 81 mg by mouth daily.   atorvastatin (LIPITOR) 80 MG tablet TAKE 1 TABLET BY MOUTH EVERY DAY   CALCIUM-MAGNESIUM-ZINC PO Take 1 tablet by mouth daily.   Flaxseed, Linseed, (FLAXSEED OIL PO) Take 1 capsule by mouth daily.   furosemide (LASIX) 40 MG tablet Take 1 tablet (40 mg total) by mouth daily.   gabapentin (NEURONTIN) 300 MG capsule TAKE 1 CAPSULE BY MOUTH TWICE A DAY   lisinopril (ZESTRIL) 20 MG tablet Take 1 tablet (20 mg total) by mouth daily.   metFORMIN (GLUCOPHAGE) 500 MG tablet Take 2 tablets (1,000 mg total) by mouth 2 (two) times daily.   metoprolol succinate (TOPROL-XL) 25 MG 24 hr tablet TAKE 1 TABLET BY MOUTH EVERYDAY AT BEDTIME   Multiple Vitamin (MULTIVITAMIN WITH MINERALS) TABS Take 1 tablet by mouth daily.   mupirocin ointment (BACTROBAN) 2 % Apply 1 application topically 2 (two) times daily.   NEOMYCIN-POLYMYXIN-HYDROCORTISONE (CORTISPORIN) 1 % SOLN OTIC solution Place 3 drops into the right ear 4 (four) times daily.   nitroGLYCERIN (NITROSTAT) 0.4 MG SL tablet TAKE 1 TABLET UNDER TONGUE EVERY 5 MINS FOR CHEST PAIN GO TO ED AFTER 2ND DOSE   testosterone cypionate (DEPOTESTOSTERONE CYPIONATE) 200 MG/ML injection INJECT 0.5 MLS (100 MG TOTAL) INTO THE MUSCLE EVERY 7 (SEVEN) DAYS.   valACYclovir (VALTREX) 1000 MG tablet Take 1 tablet (1,000 mg total) by mouth 2 (two) times daily for 10 days.     [place] Office Visit from 08/26/2020 in [facility]  PHQ-9 Total Score 0        Objective:   Today's Vitals: BP (!) 128/56   Pulse 72   Temp 97.7 F (36.5 C) (Temporal)   Ht 5\' 7"  (1.702 m)   Wt 187 lb 12.8 oz (85.2 kg)   SpO2 95%   BMI 29.41 kg/m  Vitals with BMI 08/26/2020 08/13/2020 08/05/2020  Height 5\' 7"  5\' 7"  5\' 7"   Weight 187 lbs 13 oz 191 lbs 13 oz 198 lbs 10 oz  BMI 29.41 32.35 57.3  Systolic 220 254 270  Diastolic 56 64 58  Pulse 72 65 60     Physical Exam  He appears to have a vesicular rash in the right chin/right lip area consistent with shingles.  Tympanic membrane examination shows a cloudy tympanic membrane consistent with otitis media.     Assessment  1. Herpes zoster with complication   2. Non-recurrent acute suppurative otitis media of left ear without spontaneous rupture of tympanic membrane   3. Memory loss       Tests ordered Orders Placed This Encounter  Procedures   MR BRAIN WO CONTRAST      Plan: 1.  I recommended he continue the Augmentin as the antibiotic of choice for his ear infection. 2.  I am going to give him Valtrex for presumed shingles. 3.  I will arrange MRI brain scan for evaluation of his memory loss episodes. 4.  I will see him in close follow-up in about a week's time.  If he is not improving, we may need to refer him to a specialist.    Meds ordered this encounter  Medications   valACYclovir (VALTREX) 1000 MG tablet    Sig: Take 1 tablet (1,000 mg total) by mouth 2 (two) times daily for 10 days.    Dispense:  20 tablet    Refill:  0   NEOMYCIN-POLYMYXIN-HYDROCORTISONE (CORTISPORIN) 1 % SOLN OTIC solution    Sig: Place 3 drops into the right ear 4 (four) times daily.    Dispense:  10 mL    Refill:  1    Merwin Breden Luther Parody, MD

## 2020-08-27 ENCOUNTER — Ambulatory Visit (INDEPENDENT_AMBULATORY_CARE_PROVIDER_SITE_OTHER): Payer: Medicare HMO | Admitting: Internal Medicine

## 2020-09-03 ENCOUNTER — Other Ambulatory Visit: Payer: Self-pay

## 2020-09-03 ENCOUNTER — Ambulatory Visit (INDEPENDENT_AMBULATORY_CARE_PROVIDER_SITE_OTHER): Payer: Medicare HMO | Admitting: Internal Medicine

## 2020-09-03 ENCOUNTER — Encounter (INDEPENDENT_AMBULATORY_CARE_PROVIDER_SITE_OTHER): Payer: Self-pay | Admitting: Internal Medicine

## 2020-09-03 VITALS — BP 146/80 | HR 78 | Temp 97.8°F | Resp 18 | Ht 67.0 in | Wt 185.0 lb

## 2020-09-03 DIAGNOSIS — H6121 Impacted cerumen, right ear: Secondary | ICD-10-CM

## 2020-09-03 DIAGNOSIS — H66002 Acute suppurative otitis media without spontaneous rupture of ear drum, left ear: Secondary | ICD-10-CM

## 2020-09-03 NOTE — Progress Notes (Signed)
Metrics: Intervention Frequency ACO  Documented Smoking Status Yearly  Screened one or more times in 24 months  Cessation Counseling or  Active cessation medication Past 24 months  Past 24 months   Guideline developer: UpToDate (See UpToDate for funding source) Date Released: 2014       Wellness Office Visit  Subjective:  Patient ID: Jonathan Gilmore, male    DOB: [birth year]  Age: 70 y.o. MRN: [id number]  CC: Right ear discomfort/reduced hearing. HPI  I had seen this man for right otitis media but more recently with what I thought was shingles on the right face.  The shingles rash appears to have cleared up.  Unfortunately, he still has discomfort in his right ear with reduced hearing. Past Medical History:  Diagnosis Date   Abnormal cardiovascular stress test 03/2013   Anxiety    Arthritis    Back pain    CHF (congestive heart failure) (HCC)    Coronary artery disease    Multivessel with significant LAD involvement by chest CT 2014   Depression    Diastolic dysfunction    Essential hypertension    Lumbar herniated disc    L4-L5   MRSA (methicillin resistant staph aureus) culture positive    Shortness of breath dyspnea    Sleep apnea    No cpap   Type 2 diabetes mellitus [facility])    Past Surgical History:  Procedure Laterality Date   ANTERIOR CERVICAL DECOMP/DISCECTOMY FUSION  01/18/2011   Procedure: ANTERIOR CERVICAL DECOMPRESSION/DISCECTOMY FUSION 2 LEVELS;  Surgeon: Cooper Render Pool;  Location: [place] [facility];  Service: Neurosurgery;  Laterality: N/A;  anterior cervical discectomy and fusion with allograft and plating, cervical five-six, cervical six-seven   BIOPSY N/A 04/04/2015   Procedure: BIOPSY;  Surgeon: Rogene Houston, MD;  [facility];  Service: Endoscopy;  Laterality: N/A;   BIOPSY  11/09/2019   Procedure: BIOPSY;  Surgeon: Harvel Quale, MD;  [facility];  Service: Gastroenterology;;   Bone spur removed  1997   Left shoulder   CARDIAC  CATHETERIZATION N/A 08/08/2015   Procedure: Right/Left Heart Cath and Coronary Angiography;  Surgeon: Sherren Mocha, MD;  Location: [place] [facility];  Service: Cardiovascular;  Laterality: N/A;   CARPAL TUNNEL RELEASE Right    COLONOSCOPY WITH PROPOFOL N/A 11/09/2019   Procedure: COLONOSCOPY WITH PROPOFOL;  Surgeon: Harvel Quale, MD;  [facility];  Service: Gastroenterology;  Laterality: N/A;  900   ESOPHAGEAL DILATION N/A 04/04/2015   Procedure: ESOPHAGEAL DILATION;  Surgeon: Rogene Houston, MD;  [facility];  Service: Endoscopy;  Laterality: N/A;   ESOPHAGEAL DILATION N/A 11/09/2019   Procedure: ESOPHAGEAL DILATION;  Surgeon: Harvel Quale, MD;  [facility];  Service: Gastroenterology;  Laterality: N/A;   ESOPHAGOGASTRODUODENOSCOPY N/A 04/04/2015   Procedure: ESOPHAGOGASTRODUODENOSCOPY (EGD);  Surgeon: Rogene Houston, MD;  [facility];  Service: Endoscopy;  Laterality: N/A;  1240   ESOPHAGOGASTRODUODENOSCOPY (EGD) WITH PROPOFOL N/A 11/09/2019   Procedure: ESOPHAGOGASTRODUODENOSCOPY (EGD) WITH PROPOFOL;  Surgeon: Harvel Quale, MD;  [facility];  Service: Gastroenterology;  Laterality: N/A;   HEMORRHOID SURGERY     POLYPECTOMY  11/09/2019   Procedure: POLYPECTOMY;  Surgeon: Harvel Quale, MD;  [facility];  Service: Gastroenterology;;   TONSILLECTOMY       Family History  Problem Relation Age of Onset   Lung cancer Mother    Diabetes Father    Hepatitis  B Father    Diabetes Sister    Cushing syndrome Sister     Social History   Social History Narrative   Married for 5 years.Retired Living with wife.   Social History   Tobacco Use   Smoking status: Every Day    Packs/day: 1.00    Years: 50.00    Pack years: 50.00    Types: Cigarettes    Start date: 11/26/1967   Smokeless tobacco: Former   Tobacco comments:    smoking x 50 yrs  Substance Use Topics    Alcohol use: Yes    Alcohol/week: 1.0 standard drink    Types: 1 Standard drinks or equivalent per week    Comment: rare    Current Meds  Medication Sig   ACCU-CHEK AVIVA PLUS test strip USE AS INSTRUCTED   aspirin EC 81 MG tablet Take 81 mg by mouth daily.   atorvastatin (LIPITOR) 80 MG tablet TAKE 1 TABLET BY MOUTH EVERY DAY   CALCIUM-MAGNESIUM-ZINC PO Take 1 tablet by mouth daily.   Flaxseed, Linseed, (FLAXSEED OIL PO) Take 1 capsule by mouth daily.   furosemide (LASIX) 40 MG tablet Take 1 tablet (40 mg total) by mouth daily.   gabapentin (NEURONTIN) 300 MG capsule TAKE 1 CAPSULE BY MOUTH TWICE A DAY   lisinopril (ZESTRIL) 20 MG tablet Take 1 tablet (20 mg total) by mouth daily.   metFORMIN (GLUCOPHAGE) 500 MG tablet Take 2 tablets (1,000 mg total) by mouth 2 (two) times daily.   metoprolol succinate (TOPROL-XL) 25 MG 24 hr tablet TAKE 1 TABLET BY MOUTH EVERYDAY AT BEDTIME   Multiple Vitamin (MULTIVITAMIN WITH MINERALS) TABS Take 1 tablet by mouth daily.   mupirocin ointment (BACTROBAN) 2 % Apply 1 application topically 2 (two) times daily.   NEOMYCIN-POLYMYXIN-HYDROCORTISONE (CORTISPORIN) 1 % SOLN OTIC solution Place 3 drops into the right ear 4 (four) times daily.   nitroGLYCERIN (NITROSTAT) 0.4 MG SL tablet TAKE 1 TABLET UNDER TONGUE EVERY 5 MINS FOR CHEST PAIN GO TO ED AFTER 2ND DOSE   testosterone cypionate (DEPOTESTOSTERONE CYPIONATE) 200 MG/ML injection INJECT 0.5 MLS (100 MG TOTAL) INTO THE MUSCLE EVERY 7 (SEVEN) DAYS. (Patient taking differently: Inject 100 mg into the muscle 2 (two) times a week.)   valACYclovir (VALTREX) 1000 MG tablet Take 1 tablet (1,000 mg total) by mouth 2 (two) times daily for 10 days.     [place] Office Visit from 08/26/2020 in [facility]  PHQ-9 Total Score 0       Objective:   Today's Vitals: BP (!) 146/80 (BP Location: Right Arm, Patient Position: Sitting, Cuff Size: Normal)   Pulse 78   Temp 97.8 F (36.6 C) (Temporal)    Resp 18   Ht 5\' 7"  (1.702 m)   Wt 185 lb (83.9 kg)   SpO2 98%   BMI 28.98 kg/m  Vitals with BMI 09/03/2020 08/26/2020 08/13/2020  Height 5\' 7"  5\' 7"  5\' 7"   Weight 185 lbs 187 lbs 13 oz 191 lbs 13 oz  BMI 28.97 14.97 02.63  Systolic 785 885 027  Diastolic 80 56 64  Pulse 78 72 65     Physical Exam   Examination of his right ear and ear canal shows a good amount of wax in the ear canal.  Unfortunately, it was much deeper than I expected and was unable to remove it.  The shingles rash on the right side of his face appears to have completely resolved.    Assessment  1. Non-recurrent acute suppurative otitis media of left ear without spontaneous rupture of tympanic membrane   2. Excessive ear wax, right       Tests ordered Orders Placed This Encounter  Procedures   Ambulatory referral to ENT      Plan: 1.  Debrox was inserted by the medical assistant into the right ear canal and flushed, just a little wax came out.  I will refer him to ENT as soon as possible for further evaluation.    No orders of the defined types were placed in this encounter.   Doree Albee, MD

## 2020-09-03 NOTE — Progress Notes (Signed)
Pt said lately he notices he is low energy. [facility] And [facility] was very [facility]. Pt said they told him Dr.G gave the adderall to give energy. I stated it is not that drug he gives for energy.

## 2020-09-10 ENCOUNTER — Other Ambulatory Visit: Payer: Medicare HMO

## 2020-09-10 ENCOUNTER — Other Ambulatory Visit: Payer: Self-pay

## 2020-09-10 ENCOUNTER — Ambulatory Visit (INDEPENDENT_AMBULATORY_CARE_PROVIDER_SITE_OTHER): Payer: Medicare HMO

## 2020-09-10 ENCOUNTER — Ambulatory Visit
Admission: RE | Admit: 2020-09-10 | Discharge: 2020-09-10 | Disposition: A | Discharge status: Home / Self Care | Payer: Medicare HMO | Source: Ambulatory Visit | Attending: Internal Medicine | Admitting: Internal Medicine

## 2020-09-10 DIAGNOSIS — I350 Nonrheumatic aortic (valve) stenosis: Secondary | ICD-10-CM

## 2020-09-10 LAB — ECHOCARDIOGRAM COMPLETE
AR max vel: 1.22 cm2
AV Area VTI: 1.12 cm2
AV Area mean vel: 1.08 cm2
AV Mean grad: 17 mmHg
AV Peak grad: 26.7 mmHg
AV Vena cont: 0.32 cm
Ao pk vel: 2.59 m/s
Area-P 1/2: 2.93 cm2
Calc EF: 68.9 %
MV M vel: 3.13 m/s
MV Peak grad: 39.1 mmHg
P 1/2 time: 632 msec
S' Lateral: 2.96 cm
Single Plane A2C EF: 71.4 %
Single Plane A4C EF: 65.2 %

## 2020-09-11 ENCOUNTER — Other Ambulatory Visit (INDEPENDENT_AMBULATORY_CARE_PROVIDER_SITE_OTHER): Payer: Self-pay | Admitting: Nurse Practitioner

## 2020-09-11 ENCOUNTER — Encounter: Payer: Self-pay | Admitting: Internal Medicine

## 2020-09-11 DIAGNOSIS — H7493 Unspecified disorder of middle ear and mastoid, bilateral: Secondary | ICD-10-CM

## 2020-09-11 DIAGNOSIS — R413 Other amnesia: Secondary | ICD-10-CM

## 2020-09-11 DIAGNOSIS — R42 Dizziness and giddiness: Secondary | ICD-10-CM

## 2020-09-11 LAB — HM DIABETES EYE EXAM

## 2020-09-12 ENCOUNTER — Other Ambulatory Visit: Payer: Medicare HMO

## 2020-09-20 ENCOUNTER — Other Ambulatory Visit (INDEPENDENT_AMBULATORY_CARE_PROVIDER_SITE_OTHER): Payer: Self-pay | Admitting: Internal Medicine

## 2020-09-25 ENCOUNTER — Telehealth: Payer: Self-pay | Admitting: *Deleted

## 2020-09-25 NOTE — Telephone Encounter (Signed)
-----   Message from Arnoldo Lenis, MD sent at 09/18/2020  4:00 PM EDT ----- Aortic valve remains mild to moderately stiff, we will continue to monitor   Zandra Abts MD

## 2020-09-25 NOTE — Telephone Encounter (Signed)
Laurine Blazer, LPN  7/68/0881 10:31 AM EDT Back to Top     Notified, copy to pcp.

## 2020-10-01 ENCOUNTER — Encounter (INDEPENDENT_AMBULATORY_CARE_PROVIDER_SITE_OTHER): Payer: Self-pay | Admitting: Internal Medicine

## 2020-10-17 ENCOUNTER — Other Ambulatory Visit (INDEPENDENT_AMBULATORY_CARE_PROVIDER_SITE_OTHER): Payer: Self-pay | Admitting: Internal Medicine

## 2020-11-03 ENCOUNTER — Ambulatory Visit: Payer: Medicare HMO | Admitting: Neurology

## 2020-11-03 ENCOUNTER — Telehealth: Payer: Self-pay | Admitting: Neurology

## 2020-11-03 ENCOUNTER — Encounter: Payer: Self-pay | Admitting: Neurology

## 2020-11-03 VITALS — BP 136/61 | HR 63 | Ht 67.0 in | Wt 187.0 lb

## 2020-11-03 DIAGNOSIS — E1142 Type 2 diabetes mellitus with diabetic polyneuropathy: Secondary | ICD-10-CM

## 2020-11-03 DIAGNOSIS — G3184 Mild cognitive impairment, so stated: Secondary | ICD-10-CM | POA: Diagnosis not present

## 2020-11-03 NOTE — Progress Notes (Signed)
[facility]  PATIENT: Jonathan Gilmore DOB: Aug 01, 1950  REFERRING CLINICIAN: Ailene Ards, NP HISTORY FROM: Patient  REASON FOR VISIT: Memory problem/Strokes on CT scan.   HISTORICAL  CHIEF COMPLAINT:  Chief Complaint  Patient presents with   New Patient (Initial Visit)    New patient:  had abnormal CT scan and was told he had mini strokes. Room 13, alone in room    HISTORY OF PRESENT ILLNESS:  This is a 70 year old gentleman with past medical history of diabetes type mellitus type II, heart failure who is presenting for memory problem.  Patient stated for the past 10 years he has been noting some memory problems described as being more forgetful.  He mentioned that when going to the grocery store he has to have a list of else he will miss some items.  He lives with his wife, reported he pays all the bills but use automatic bill payment.  Also reported wife has been complaining about his memory.  He currently drives his car.  Denies any recent accidents, denies being loss but reports a couple months ago he was driving and was at a familiar intersection and for "a blink of an eye" he did not know where he was, he said after a few seconds everything came back he was able to continue driving.  He did mention that he was worried about the situation.  This has never happened again.  He denies forgetting names of familiar people, he denies burning to cook with cooking denies being lost in family recent but states that he is somewhat indecisive, making up his mind seems a bit difficult in the past 10 years.   OTHER MEDICAL CONDITIONS: DMII, heart Failure,    REVIEW OF SYSTEMS: Full 14 system review of systems performed and negative with exception of: as noted in the HPI.   ALLERGIES: Allergies  Allergen Reactions   Morphine And Related Nausea And Vomiting    HOME MEDICATIONS: Outpatient Medications Prior to Visit  Medication Sig Dispense Refill   ACCU-CHEK AVIVA PLUS  test strip USE AS INSTRUCTED 100 strip 3   aspirin EC 81 MG tablet Take 81 mg by mouth daily.     atorvastatin (LIPITOR) 80 MG tablet TAKE 1 TABLET BY MOUTH EVERY DAY 90 tablet 2   CALCIUM-MAGNESIUM-ZINC PO Take 1 tablet by mouth daily.     Flaxseed, Linseed, (FLAXSEED OIL PO) Take 1 capsule by mouth daily.     furosemide (LASIX) 40 MG tablet Take 1 tablet (40 mg total) by mouth daily. 90 tablet 3   gabapentin (NEURONTIN) 300 MG capsule TAKE 1 CAPSULE BY MOUTH TWICE A DAY 180 capsule 1   lisinopril (ZESTRIL) 20 MG tablet Take 1 tablet (20 mg total) by mouth daily. 90 tablet 3   metFORMIN (GLUCOPHAGE) 500 MG tablet TAKE 2 TABLETS TWICE A DAY 360 tablet 0   metoprolol succinate (TOPROL-XL) 25 MG 24 hr tablet TAKE 1 TABLET BY MOUTH EVERYDAY AT BEDTIME 90 tablet 3   Multiple Vitamin (MULTIVITAMIN WITH MINERALS) TABS Take 1 tablet by mouth daily.     mupirocin ointment (BACTROBAN) 2 % Apply 1 application topically 2 (two) times daily. 22 g 0   NEOMYCIN-POLYMYXIN-HYDROCORTISONE (CORTISPORIN) 1 % SOLN OTIC solution Place 3 drops into the right ear 4 (four) times daily. 10 mL 1   nitroGLYCERIN (NITROSTAT) 0.4 MG SL tablet TAKE 1 TABLET UNDER TONGUE EVERY 5 MINS FOR CHEST PAIN GO TO ED AFTER 2ND DOSE 25 tablet 2  testosterone cypionate (DEPOTESTOSTERONE CYPIONATE) 200 MG/ML injection Inject 0.5 mLs (100 mg total) into the muscle 2 (two) times a week. 10 mL 2   amoxicillin-clavulanate (AUGMENTIN) 875-125 MG tablet Take 1 tablet by mouth 2 (two) times daily. (Patient not taking: No sig reported) 20 tablet 0   No facility-administered medications prior to visit.    PAST MEDICAL HISTORY: Past Medical History:  Diagnosis Date   Abnormal cardiovascular stress test 03/2013   Anxiety    Arthritis    Back pain    CHF (congestive heart failure) (HCC)    Coronary artery disease    Multivessel with significant LAD involvement by chest CT 2014   Depression    Diastolic dysfunction    Essential hypertension     Lumbar herniated disc    L4-L5   MRSA (methicillin resistant staph aureus) culture positive    Shortness of breath dyspnea    Sleep apnea    No cpap   Type 2 diabetes mellitus ([place])     PAST SURGICAL HISTORY: Past Surgical History:  Procedure Laterality Date   ANTERIOR CERVICAL DECOMP/DISCECTOMY FUSION  01/18/2011   Procedure: ANTERIOR CERVICAL DECOMPRESSION/DISCECTOMY FUSION 2 LEVELS;  Surgeon: Cooper Render Pool;  Location: [place] [facility];  Service: Neurosurgery;  Laterality: N/A;  anterior cervical discectomy and fusion with allograft and plating, cervical five-six, cervical six-seven   BIOPSY N/A 04/04/2015   Procedure: BIOPSY;  Surgeon: Rogene Houston, MD;  [facility]: [facility];  Service: Endoscopy;  Laterality: N/A;   BIOPSY  11/09/2019   Procedure: BIOPSY;  Surgeon: Harvel Quale, MD;  Location: [facility];  Service: Gastroenterology;;   Bone spur removed  1997   Left shoulder   CARDIAC CATHETERIZATION N/A 08/08/2015   Procedure: Right/Left Heart Cath and Coronary Angiography;  Surgeon: Sherren Mocha, MD;  Location: [place] [facility];  Service: Cardiovascular;  Laterality: N/A;   CARPAL TUNNEL RELEASE Right    COLONOSCOPY WITH PROPOFOL N/A 11/09/2019   Procedure: COLONOSCOPY WITH PROPOFOL;  Surgeon: Harvel Quale, MD;  [facility]: [facility];  Service: Gastroenterology;  Laterality: N/A;  900   ESOPHAGEAL DILATION N/A 04/04/2015   Procedure: ESOPHAGEAL DILATION;  Surgeon: Rogene Houston, MD;  [facility]: [facility];  Service: Endoscopy;  Laterality: N/A;   ESOPHAGEAL DILATION N/A 11/09/2019   Procedure: ESOPHAGEAL DILATION;  Surgeon: Harvel Quale, MD;  [facility]: [facility];  Service: Gastroenterology;  Laterality: N/A;   ESOPHAGOGASTRODUODENOSCOPY N/A 04/04/2015   Procedure: ESOPHAGOGASTRODUODENOSCOPY (EGD);  Surgeon: Rogene Houston, MD;  [facility];  Service: Endoscopy;  Laterality: N/A;  1240   ESOPHAGOGASTRODUODENOSCOPY  (EGD) WITH PROPOFOL N/A 11/09/2019   Procedure: ESOPHAGOGASTRODUODENOSCOPY (EGD) WITH PROPOFOL;  Surgeon: Harvel Quale, MD;  [facility];  Service: Gastroenterology;  Laterality: N/A;   HEMORRHOID SURGERY     POLYPECTOMY  11/09/2019   Procedure: POLYPECTOMY;  Surgeon: Harvel Quale, MD;  [facility]: [facility];  Service: Gastroenterology;;   TONSILLECTOMY      FAMILY HISTORY: Family History  Problem Relation Age of Onset   Lung cancer Mother    Diabetes Father    Hepatitis B Father    Diabetes Sister    Cushing syndrome Sister     SOCIAL HISTORY: Social History   Socioeconomic History   Marital status: Married    Spouse name: Not on file   Number of children: Not on file   Years of education: Not on file   Highest education  level: Not on file  Occupational History   Not on file  Tobacco Use   Smoking status: Every Day    Packs/day: 1.00    Years: 50.00    Pack years: 50.00    Types: Cigarettes    Start date: 11/26/1967   Smokeless tobacco: Former   Tobacco comments:    smoking x 50 yrs  Vaping Use   Vaping Use: Never used  Substance and Sexual Activity   Alcohol use: Yes    Alcohol/week: 1.0 standard drink    Types: 1 Standard drinks or equivalent per week    Comment: rare   Drug use: Yes    Frequency: 1.0 times per week    Types: Marijuana    Comment: as needed   Sexual activity: Yes  Other Topics Concern   Not on file  Social History Narrative   Lives with wife   Right Handed   Drinks 2-3 cups caffeine daily   Social Determinants of Health   Financial Resource Strain: Not on file  Food Insecurity: Not on file  Transportation Needs: Not on file  Physical Activity: Not on file  Stress: Not on file  Social Connections: Not on file  Intimate Partner Violence: Not on file     PHYSICAL EXAM  GENERAL EXAM/CONSTITUTIONAL: Vitals:  Vitals:   11/03/20 1311  BP: 136/61  Pulse: 63  Weight: 187 lb (84.8 kg)   Height: '5\' 7"'$  (1.702 m)   Body mass index is 29.29 kg/m. Wt Readings from Last 3 Encounters:  11/03/20 187 lb (84.8 kg)  09/03/20 185 lb (83.9 kg)  08/26/20 187 lb 12.8 oz (85.2 kg)   Patient is in no distress; well developed, nourished and groomed; neck is supple  CARDIOVASCULAR: Examination of carotid arteries is normal; no carotid bruits Regular rate and rhythm, no murmurs Examination of peripheral vascular system by observation and palpation is normal  EYES: Pupils round and reactive to light, Visual fields full to confrontation, Extraocular movements intacts,   MUSCULOSKELETAL: Gait, strength, tone, movements noted in Neurologic exam below  NEUROLOGIC: MENTAL STATUS:  MMSE - [place] Exam 11/03/2020  Orientation to time 5  Orientation to Place 5  Registration 3  Attention/ Calculation 5  Recall 3  Language- name 2 objects 2  Language- repeat 0  Language- follow 3 step command 3  Language- read & follow direction 1  Write a sentence 0  Copy design 0  Total score 27   awake, alert, oriented to person, place and time recent and remote memory intact normal attention and concentration language fluent, comprehension intact, naming intact fund of knowledge appropriate  CRANIAL NERVE:  2nd, 3rd, 4th, 6th - pupils equal and reactive to light, visual fields full to confrontation, extraocular muscles intact, no nystagmus 5th - facial sensation symmetric 7th - facial strength symmetric 8th - hearing intact 9th - palate elevates symmetrically, uvula midline 11th - shoulder shrug symmetric 12th - tongue protrusion midline  MOTOR:  normal bulk and tone, full strength in the BUE, BLE  SENSORY:  Decrease sensation to light touch, pinprick and vibration in the BLE up to mid shin  COORDINATION:  finger-nose-finger, fine finger movements normal  REFLEXES:  deep tendon reflexes present and symmetric  GAIT/STATION:  normal   DIAGNOSTIC DATA (LABS,  IMAGING, TESTING) - I reviewed patient records, labs, notes, testing and imaging myself where available.  Lab Results  Component Value Date   WBC 8.7 06/23/2019   HGB 14.0 06/23/2019  HCT 42.7 06/23/2019   MCV 94.3 06/23/2019   PLT 206 06/23/2019      Component Value Date/Time   NA 141 08/13/2020 0859   K 4.2 08/13/2020 0859   CL 106 08/13/2020 0859   CO2 28 08/13/2020 0859   GLUCOSE 163 (H) 08/13/2020 0859   BUN 10 08/13/2020 0859   CREATININE 0.70 08/13/2020 0859   CALCIUM 8.7 08/13/2020 0859   PROT 6.5 05/06/2020 1325   ALBUMIN 3.7 08/08/2015 0335   AST 18 05/06/2020 1325   ALT 19 05/06/2020 1325   ALKPHOS 74 08/08/2015 0335   BILITOT 0.4 05/06/2020 1325   GFRNONAA 97 05/06/2020 1325   GFRAA 113 05/06/2020 1325   Lab Results  Component Value Date   CHOL 103 05/06/2020   HDL 35 (L) 05/06/2020   LDLCALC 45 05/06/2020   TRIG 143 05/06/2020   CHOLHDL 2.9 05/06/2020   Lab Results  Component Value Date   HGBA1C 7.1 (H) 08/13/2020   Lab Results  Component Value Date   VITAMINB12 889 09/19/2013   Lab Results  Component Value Date   TSH 1.96 11/14/2018    MRI Brain 08/2020 Mild age related volume loss. No evidence of accelerated atrophy. No evidence of old or recent stroke. Normal appearance of the brain for age.  I personally reviewed the MRI Brain.  ASSESSMENT AND PLAN  70 y.o. year old male  with past medical history of diabetes mellitus, heart failure who is presenting for memory problem.  Patient states that the memory problem has been going for the past 10 years and he describes it as forgetting or being more forgetful getting more frequent.  He denies forgetting names of family members or familiar persons, denies being lost any family places other than the one incident where he was at an intersection and for a few seconds did not know where he was, but everything came back to him after a few seconds and he was able to drive again.  He denies any additional  similar episodes.   On exam today his Mini-Mental status exam was 27 out of 30, within the normal range.  He was also noted to have bilateral lower extremity neuropathy evidenced by decreased sensation to light touch pinprick and vibration up to the mid shin.  MRI of the Brain was also reviewed with patient and I showed him all the images; it showed mild age-related volume loss and no evidence of accelerated atrophy and no evidence of stroke.  Informing him also that he has mild cognitive impairment and currently I would not start him on any medications. I recommended him to continue his current therapies, continue physical activity and I will see him in 6 months. At that time we will reassess to see if patient will benefit from a medication such as Aricept   1. Mild cognitive impairment   2. Diabetic polyneuropathy associated with type 2 diabetes mellitus ([place])     PLAN:  Your Brain MRI does not show evidence of strokes.  Continue your current medications  Follow up in 6 months.  No orders of the defined types were placed in this encounter.   No orders of the defined types were placed in this encounter.   Return in about 6 months (around 05/06/2021).    Alric Ran, MD 11/03/2020, 2:10 PM  [facility] [address] [place], [place] [address] [phone]

## 2020-11-03 NOTE — Telephone Encounter (Signed)
Pt declined scheduling 6 month f/u at this time.

## 2020-11-03 NOTE — Patient Instructions (Signed)
Your Brain MRI does not show evidence of strokes.  Continue your current medications

## 2020-11-17 ENCOUNTER — Ambulatory Visit (INDEPENDENT_AMBULATORY_CARE_PROVIDER_SITE_OTHER): Payer: Medicare HMO | Admitting: Internal Medicine

## 2020-11-18 ENCOUNTER — Ambulatory Visit (INDEPENDENT_AMBULATORY_CARE_PROVIDER_SITE_OTHER): Payer: Medicare HMO | Admitting: Internal Medicine

## 2020-12-01 ENCOUNTER — Ambulatory Visit (INDEPENDENT_AMBULATORY_CARE_PROVIDER_SITE_OTHER): Payer: Medicare HMO | Admitting: Family Medicine

## 2020-12-01 ENCOUNTER — Other Ambulatory Visit: Payer: Self-pay

## 2020-12-01 VITALS — BP 134/76 | HR 53 | Temp 98.0°F | Resp 16 | Ht 67.0 in | Wt 178.6 lb

## 2020-12-01 DIAGNOSIS — I2510 Atherosclerotic heart disease of native coronary artery without angina pectoris: Secondary | ICD-10-CM | POA: Diagnosis not present

## 2020-12-01 DIAGNOSIS — R29898 Other symptoms and signs involving the musculoskeletal system: Secondary | ICD-10-CM

## 2020-12-01 DIAGNOSIS — M5116 Intervertebral disc disorders with radiculopathy, lumbar region: Secondary | ICD-10-CM

## 2020-12-01 DIAGNOSIS — Z8669 Personal history of other diseases of the nervous system and sense organs: Secondary | ICD-10-CM

## 2020-12-01 DIAGNOSIS — E291 Testicular hypofunction: Secondary | ICD-10-CM

## 2020-12-01 DIAGNOSIS — G6289 Other specified polyneuropathies: Secondary | ICD-10-CM

## 2020-12-01 DIAGNOSIS — Z5181 Encounter for therapeutic drug level monitoring: Secondary | ICD-10-CM | POA: Diagnosis not present

## 2020-12-01 DIAGNOSIS — Z720 Tobacco use: Secondary | ICD-10-CM

## 2020-12-01 DIAGNOSIS — I10 Essential (primary) hypertension: Secondary | ICD-10-CM

## 2020-12-01 DIAGNOSIS — E1159 Type 2 diabetes mellitus with other circulatory complications: Secondary | ICD-10-CM | POA: Diagnosis not present

## 2020-12-01 LAB — CBC
HCT: 44.1 % (ref 39.0–52.0)
Hemoglobin: 14.7 g/dL (ref 13.0–17.0)
MCHC: 33.3 g/dL (ref 30.0–36.0)
MCV: 90.4 fl (ref 78.0–100.0)
Platelets: 206 10*3/uL (ref 150.0–400.0)
RBC: 4.88 Mil/uL (ref 4.22–5.81)
RDW: 15 % (ref 11.5–15.5)
WBC: 8.6 10*3/uL (ref 4.0–10.5)

## 2020-12-01 LAB — HEMOGLOBIN A1C: Hgb A1c MFr Bld: 6.8 % — ABNORMAL HIGH (ref 4.6–6.5)

## 2020-12-01 MED ORDER — LISINOPRIL 20 MG PO TABS: 20.0000 mg | ORAL_TABLET | Freq: Every day | ORAL | 3 refills | Status: DC

## 2020-12-01 MED ORDER — NITROGLYCERIN 0.4 MG SL SUBL: SUBLINGUAL_TABLET | SUBLINGUAL | 2 refills | Status: DC

## 2020-12-01 NOTE — Patient Instructions (Addendum)
For sleep apnea, let me know I you would like to have a repeat test - I do recommend that if you have any daytime sleepiness.  Do not skip meals - set a reminder or alarm on your phone.  If you have Unexplained weight loss - be seen right away.  I will check labs today, and again in 2 weeks. I may need to have that followed by endocrinology or urology. We can discuss further next visit.  Please have xray of sternum (breastbone) at [facility].

## 2020-12-01 NOTE — Progress Notes (Signed)
Subjective:  Patient ID: Jonathan Gilmore, male    DOB: 1950/10/26  Age: 70 y.o. MRN: [id number]  CC:  Chief Complaint  Patient presents with   Medication Refill    Pt in need of lisinopril and possibly other refills before his establish care visit in December,  D Gosrani pt     HPI Jonathan Gilmore presents for  New patient, plans to establish care with me.  Has a establish care visit in the next few months but needs medication refills today.  Previously followed by Dr. Anastasio Champion.  Hypertension: With history of CAD, OSA, tobacco dependence and underlying diabetes.  Cardiologist Dr. Harl Bowie, appointment May 24.  50% LAD disease, overall patent coronaries on cath in May 2017 with EF 45 to 50% on May 2017 echo. Noncompliant with CPAP machine due to discomfort, history of severe OSA, lost over 100 pounds. No repeat test. Rare daytime sleepiness.  History of aortic stenosis.  Echo reviewed from June 29 of this year, EF 60 to 65%.  No regional wall abnormalities, moderate left ventricular hypertrophy.  Grade 1 diastolic dysfunction.  Mild mitral valve regurgitation, mild aortic valve regurgitation with moderate thickening of the aortic valve.  Mild to moderate aortic valve stenosis.  Lisinopril 20 mg daily, Lasix 40 mg daily, Toprol-XL 25 mg daily.  Needs ntg refill, no chest pains.  Home readings:  120-130/70 range.  BP Readings from Last 3 Encounters:  12/01/20 134/76  11/03/20 136/61  09/03/20 (!) 146/80   Lab Results  Component Value Date   CREATININE 0.70 08/13/2020   Bump on chest - breastbone - for years. More noticeable as lost weight. No pain, no prior injury, but had prior broken ribs.   Diabetes: With underlying vascular disease.  Treats with metformin 500 mg 2 tablets twice per day.  He is on ACE inhibitor and statin with history of CAD, hyperlipidemia.  Gabapentin 300 mg twice daily - decreased feeling in legs, some prior tingling. Helps with gabapentin. Disc issues in back comes  and goes. No surgery.  Home readings - 120-180, no 200's or recent lows.  Microalbumin: ordering today.  Optho, foot exam, pneumovax: up to date - followed by optho in [place].   Lab Results  Component Value Date   HGBA1C 7.1 (H) 08/13/2020   HGBA1C 7.9 (H) 05/06/2020   HGBA1C 7.1 (H) 10/02/2019   Lab Results  Component Value Date   LDLCALC 45 05/06/2020   CREATININE 0.70 08/13/2020   Hypogonadism: Treated by prior PCP. Testosterone '100mg'$  injection 2 times per week.  On testosterone for past year or two. No recent level.  Last injection 2 weeks ago. Ran out of needles. Usually 2 times per week. Seen by urology few months ago - told prostate was ok.   Lab Results  Component Value Date   CHOL 103 05/06/2020   HDL 35 (L) 05/06/2020   LDLCALC 45 05/06/2020   TRIG 143 05/06/2020   CHOLHDL 2.9 05/06/2020   Lab Results  Component Value Date   WBC 8.7 06/23/2019   HGB 14.0 06/23/2019   HCT 42.7 06/23/2019   MCV 94.3 06/23/2019   PLT 206 06/23/2019   Lab Results  Component Value Date   PSA 2.1 02/26/2019    Lab Results  Component Value Date   TESTOSTERONE 153 (L) 08/15/2019    History Patient Active Problem List   Diagnosis Date Noted   Insomnia 03/12/2019   Pure hypercholesterolemia 11/29/2018   Hypogonadism, male 11/23/2018   Essential  hypertension, benign 11/13/2018   High risk medication use 10/28/2018   Carotid stenosis, asymptomatic, bilateral 10/28/2018   Tobacco use 10/28/2018   Polyp of colon 10/28/2018   Lumbar disc herniation with radiculopathy 12/21/2017   Spinal stenosis of lumbar region without neurogenic claudication 12/21/2017   CAD -50% LAD  08/09/2015   Moderate aortic stenosis 08/09/2015   Acute pulmonary edema ([place]) 0000000   Acute diastolic CHF (congestive heart failure) ([place]) 08/07/2015   Hypersomnia with sleep apnea, unspecified 09/20/2013   Syncope 09/19/2013   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   OSA (obstructive sleep apnea)  -non compliant 09/19/2013   Narcolepsy and cataplexy 09/19/2013   Facial abscess 04/28/2013   Facial cellulitis 04/28/2013   Mixed hyperlipidemia 04/28/2013   Tobacco dependence 04/28/2013   Sepsis ([place]) 04/28/2013   Gout 0000000   Diastolic CHF ([place]) 0000000   Pleuritic pain 02/24/2013   Chest pain 02/24/2013   Murmur 02/24/2013   Pericarditis 02/24/2013   DM type 2 causing vascular disease ([place]) 02/24/2013   Essential hypertension 02/24/2013   Cervical spondylosis with myelopathy 01/19/2011   Past Medical History:  Diagnosis Date   Abnormal cardiovascular stress test 03/2013   Anxiety    Arthritis    Back pain    CHF (congestive heart failure) ([place])    Coronary artery disease    Multivessel with significant LAD involvement by chest CT 2014   Depression    Diastolic dysfunction    Essential hypertension    Lumbar herniated disc    L4-L5   MRSA (methicillin resistant staph aureus) culture positive    Shortness of breath dyspnea    Sleep apnea    No cpap   Type 2 diabetes mellitus ([place])    Past Surgical History:  Procedure Laterality Date   ANTERIOR CERVICAL DECOMP/DISCECTOMY FUSION  01/18/2011   Procedure: ANTERIOR CERVICAL DECOMPRESSION/DISCECTOMY FUSION 2 LEVELS;  Surgeon: Cooper Render Pool;  Location: [facility];  Service: Neurosurgery;  Laterality: N/A;  anterior cervical discectomy and fusion with allograft and plating, cervical five-six, cervical six-seven   BIOPSY N/A 04/04/2015   Procedure: BIOPSY;  Surgeon: Rogene Houston, MD;  [facility];  Service: Endoscopy;  Laterality: N/A;   BIOPSY  11/09/2019   Procedure: BIOPSY;  Surgeon: Harvel Quale, MD;  [facility];  Service: Gastroenterology;;   Bone spur removed  1997   Left shoulder   CARDIAC CATHETERIZATION N/A 08/08/2015   Procedure: Right/Left Heart Cath and Coronary Angiography;  Surgeon: Sherren Mocha, MD;  Location: [facility];  Service: Cardiovascular;   Laterality: N/A;   CARPAL TUNNEL RELEASE Right    COLONOSCOPY WITH PROPOFOL N/A 11/09/2019   Procedure: COLONOSCOPY WITH PROPOFOL;  Surgeon: Harvel Quale, MD;  [facility];  Service: Gastroenterology;  Laterality: N/A;  900   ESOPHAGEAL DILATION N/A 04/04/2015   Procedure: ESOPHAGEAL DILATION;  Surgeon: Rogene Houston, MD;  [facility];  Service: Endoscopy;  Laterality: N/A;   ESOPHAGEAL DILATION N/A 11/09/2019   Procedure: ESOPHAGEAL DILATION;  Surgeon: Harvel Quale, MD;  [facility];  Service: Gastroenterology;  Laterality: N/A;   ESOPHAGOGASTRODUODENOSCOPY N/A 04/04/2015   Procedure: ESOPHAGOGASTRODUODENOSCOPY (EGD);  Surgeon: Rogene Houston, MD;  [facility];  Service: Endoscopy;  Laterality: N/A;  1240   ESOPHAGOGASTRODUODENOSCOPY (EGD) WITH PROPOFOL N/A 11/09/2019   Procedure: ESOPHAGOGASTRODUODENOSCOPY (EGD) WITH PROPOFOL;  Surgeon: Harvel Quale, MD;  [facility];  Service: Gastroenterology;  Laterality:  N/A;   HEMORRHOID SURGERY     POLYPECTOMY  11/09/2019   Procedure: POLYPECTOMY;  Surgeon: Harvel Quale, MD;  [facility];  Service: Gastroenterology;;   TONSILLECTOMY     Allergies  Allergen Reactions   Morphine And Related Nausea And Vomiting   Prior to Admission medications   Medication Sig Start Date End Date Taking? Authorizing Provider  atorvastatin (LIPITOR) 80 MG tablet TAKE 1 TABLET BY MOUTH EVERY DAY 08/20/20  Yes Branch, Alphonse Guild, MD  furosemide (LASIX) 40 MG tablet Take 1 tablet (40 mg total) by mouth daily. 06/13/20  Yes Branch, Alphonse Guild, MD  gabapentin (NEURONTIN) 300 MG capsule TAKE 1 CAPSULE BY MOUTH TWICE A DAY 05/26/20  Yes Gosrani, Nimish C, MD  metFORMIN (GLUCOPHAGE) 500 MG tablet TAKE 2 TABLETS TWICE A DAY 10/17/20  Yes Ailene Ards, NP  metoprolol succinate (TOPROL-XL) 25 MG 24 hr tablet TAKE 1 TABLET BY MOUTH EVERYDAY AT BEDTIME 06/13/20  Yes  Branch, Alphonse Guild, MD  Multiple Vitamin (MULTIVITAMIN WITH MINERALS) TABS Take 1 tablet by mouth daily.   Yes [provider]  testosterone cypionate (DEPOTESTOSTERONE CYPIONATE) 200 MG/ML injection Inject 0.5 mLs (100 mg total) into the muscle 2 (two) times a week. 09/25/20  Yes Doree Albee, MD  ACCU-CHEK AVIVA PLUS test strip USE AS INSTRUCTED Patient not taking: Reported on 12/01/2020 06/13/19   Maryruth Hancock, MD  aspirin EC 81 MG tablet Take 81 mg by mouth daily. Patient not taking: Reported on 12/01/2020    [provider]  lisinopril (ZESTRIL) 20 MG tablet Take 1 tablet (20 mg total) by mouth daily. 08/05/20 11/03/20  Arnoldo Lenis, MD  nitroGLYCERIN (NITROSTAT) 0.4 MG SL tablet TAKE 1 TABLET UNDER TONGUE EVERY 5 MINS FOR CHEST PAIN GO TO ED AFTER 2ND DOSE Patient not taking: Reported on 12/01/2020 01/28/20   Arnoldo Lenis, MD   Social History   Socioeconomic History   Marital status: Married    Spouse name: Not on file   Number of children: Not on file   Years of education: Not on file   Highest education level: Not on file  Occupational History   Not on file  Tobacco Use   Smoking status: Every Day    Packs/day: 1.00    Years: 50.00    Pack years: 50.00    Types: Cigarettes    Start date: 11/26/1967   Smokeless tobacco: Former   Tobacco comments:    smoking x 50 yrs  Vaping Use   Vaping Use: Never used  Substance and Sexual Activity   Alcohol use: Yes    Alcohol/week: 1.0 standard drink    Types: 1 Standard drinks or equivalent per week    Comment: rare   Drug use: Yes    Frequency: 1.0 times per week    Types: Marijuana    Comment: as needed   Sexual activity: Yes  Other Topics Concern   Not on file  Social History Narrative   Lives with wife   Right Handed   Drinks 2-3 cups caffeine daily   Social Determinants of Health   Financial Resource Strain: Not on file  Food Insecurity: Not on file  Transportation Needs: Not on file   Physical Activity: Not on file  Stress: Not on file  Social Connections: Not on file  Intimate Partner Violence: Not on file    Review of Systems  Constitutional:  Positive for unexpected weight change (past few years losing  weight - but very active. no unexplained weight loss and forgets to eat at times.). Negative for fatigue.  Eyes:  Negative for visual disturbance.  Respiratory:  Negative for cough, chest tightness and shortness of breath.   Cardiovascular:  Negative for chest pain, palpitations and leg swelling.  Gastrointestinal:  Negative for abdominal pain and blood in stool.  Neurological:  Negative for dizziness, light-headedness and headaches.    Objective:   Vitals:   12/01/20 1351  BP: 134/76  Pulse: (!) 53  Resp: 16  Temp: 98 F (36.7 C)  TempSrc: Temporal  SpO2: 96%  Weight: 178 lb 9.6 oz (81 kg)  Height: '5\' 7"'$  (1.702 m)    Physical Exam Vitals reviewed.  Constitutional:      Appearance: He is well-developed.  HENT:     Head: Normocephalic and atraumatic.  Neck:     Vascular: No carotid bruit or JVD.  Cardiovascular:     Rate and Rhythm: Normal rate and regular rhythm.     Heart sounds: Murmur (2/6 systolic) heard.  Pulmonary:     Effort: Pulmonary effort is normal.     Breath sounds: Normal breath sounds. No rales.  Chest:    Musculoskeletal:     Right lower leg: No edema.     Left lower leg: No edema.  Skin:    General: Skin is warm and dry.  Neurological:     Mental Status: He is alert and oriented to person, place, and time.  Psychiatric:        Mood and Affect: Mood normal.       Assessment & Plan:  Jonathan Gilmore is a 70 y.o. male . Coronary artery disease involving native heart without angina pectoris, unspecified vessel or lesion type - Plan: Comprehensive metabolic panel, Lipid panel, nitroGLYCERIN (NITROSTAT) 0.4 MG SL tablet  -asymptomatic, continue follow-up with cardiology, refill nitroglycerin if needed with ER  precautions.  History of sleep apnea  -Would consider repeat testing now that he has lost weight, especially if any daytime somnolence or difficulty with sleep/awakening  Lumbar disc herniation with radiculopathy Other polyneuropathy  -Continue gabapentin.  Stable symptoms at present.  Tobacco use  -Cessation discussed,  Essential hypertension - Plan: lisinopril (ZESTRIL) 20 MG tablet  -Stable on current dose of lisinopril, refilled  Hypogonadism in male - Plan: Testosterone, PSA, CBC  -No recent testosterone levels, other labs as above.  We will check updated testosterone level although he has been off meds for a few weeks.  Restart previous dosing although that does appear to be fairly frequent.  Recheck in 2 weeks with repeat level and possible endocrinology or urology management of hypogonadism discussed.  Xiphoid prominence - Plan: DG Sternum  -Nontender, longstanding prominence, no known injury.  Previous rib fractures, potentially could have had a sternal/xiphoid injury.  Check imaging.  DM type 2 causing vascular disease ([place]) - Plan: Lipid panel, Hemoglobin A1c, Microalbumin / creatinine urine ratio  -Check labs, no change in management for now  Medication monitoring encounter - Plan: PSA, CBC  -Check labs for use of testosterone.  Meds ordered this encounter  Medications   lisinopril (ZESTRIL) 20 MG tablet    Sig: Take 1 tablet (20 mg total) by mouth daily.    Dispense:  90 tablet    Refill:  3    Dose increased 08/05/2020   nitroGLYCERIN (NITROSTAT) 0.4 MG SL tablet    Sig: TAKE 1 TABLET UNDER TONGUE EVERY 5 MINS FOR CHEST PAIN GO TO  ED AFTER 2ND DOSE    Dispense:  25 tablet    Refill:  2   Patient Instructions  For sleep apnea, let me know I you would like to have a repeat test - I do recommend that if you have any daytime sleepiness.  Do not skip meals - set a reminder or alarm on your phone.  If you have Unexplained weight loss - be seen right away.  I will  check labs today, and again in 2 weeks. I may need to have that followed by endocrinology or urology. We can discuss further next visit.  Please have xray of sternum (breastbone) at [facility].         Signed,   Merri Ray, MD [facility] 12/01/20 3:00 PM

## 2020-12-02 ENCOUNTER — Encounter: Payer: Self-pay | Admitting: Family Medicine

## 2020-12-02 LAB — COMPREHENSIVE METABOLIC PANEL WITH GFR
ALT: 15 U/L (ref 0–53)
AST: 18 U/L (ref 0–37)
Albumin: 4 g/dL (ref 3.5–5.2)
Alkaline Phosphatase: 77 U/L (ref 39–117)
BUN: 15 mg/dL (ref 6–23)
CO2: 27 meq/L (ref 19–32)
Calcium: 9.3 mg/dL (ref 8.4–10.5)
Chloride: 104 meq/L (ref 96–112)
Creatinine, Ser: 0.72 mg/dL (ref 0.40–1.50)
GFR: 92.88 mL/min
Glucose, Bld: 134 mg/dL — ABNORMAL HIGH (ref 70–99)
Potassium: 4.4 meq/L (ref 3.5–5.1)
Sodium: 139 meq/L (ref 135–145)
Total Bilirubin: 0.6 mg/dL (ref 0.2–1.2)
Total Protein: 6.6 g/dL (ref 6.0–8.3)

## 2020-12-02 LAB — LIPID PANEL
Cholesterol: 87 mg/dL (ref 0–200)
HDL: 34.9 mg/dL — ABNORMAL LOW (ref >39.00)
LDL Cholesterol: 38 mg/dL (ref 0–99)
NonHDL: 51.97
Total CHOL/HDL Ratio: 2
Triglycerides: 69 mg/dL (ref 0.0–149.0)
VLDL: 13.8 mg/dL (ref 0.0–40.0)

## 2020-12-02 LAB — TESTOSTERONE: Testosterone: 241.06 ng/dL — ABNORMAL LOW (ref 300.00–890.00)

## 2020-12-02 LAB — PSA: PSA: 3.85 ng/mL (ref 0.10–4.00)

## 2020-12-09 ENCOUNTER — Other Ambulatory Visit: Payer: Self-pay | Admitting: Family Medicine

## 2020-12-09 DIAGNOSIS — R972 Elevated prostate specific antigen [PSA]: Secondary | ICD-10-CM

## 2020-12-09 NOTE — Progress Notes (Signed)
Eval for increased PSA from prior reading, increased velocity, use of testosterone.

## 2020-12-17 ENCOUNTER — Other Ambulatory Visit: Payer: Self-pay

## 2020-12-17 ENCOUNTER — Telehealth: Payer: Self-pay

## 2020-12-17 DIAGNOSIS — R29898 Other symptoms and signs involving the musculoskeletal system: Secondary | ICD-10-CM

## 2020-12-17 NOTE — Progress Notes (Signed)
G

## 2020-12-17 NOTE — Telephone Encounter (Signed)
Called patient and spoke to him abut his Xray. Patient stated that he rather go to [facility] to get this Xray because it is close to his home. I reordered the Xray and sent it to preferred location.

## 2020-12-17 NOTE — Telephone Encounter (Signed)
Patient's wife called in yesterday about her husbands x-ray, they are requesting that the x-ray can be done at [facility].

## 2020-12-22 ENCOUNTER — Other Ambulatory Visit: Payer: Self-pay

## 2020-12-22 ENCOUNTER — Ambulatory Visit (HOSPITAL_COMMUNITY)
Admission: RE | Admit: 2020-12-22 | Discharge: 2020-12-22 | Disposition: A | Discharge status: Home / Self Care | Payer: Medicare HMO | Source: Ambulatory Visit | Attending: Family Medicine | Admitting: Family Medicine

## 2020-12-22 DIAGNOSIS — R29898 Other symptoms and signs involving the musculoskeletal system: Secondary | ICD-10-CM | POA: Diagnosis not present

## 2021-01-10 ENCOUNTER — Other Ambulatory Visit (INDEPENDENT_AMBULATORY_CARE_PROVIDER_SITE_OTHER): Payer: Self-pay | Admitting: Family Medicine

## 2021-01-15 ENCOUNTER — Other Ambulatory Visit (INDEPENDENT_AMBULATORY_CARE_PROVIDER_SITE_OTHER): Payer: Self-pay | Admitting: Nurse Practitioner

## 2021-01-24 NOTE — Progress Notes (Signed)
H&P  Chief Complaint: Elevated PSA  History of Present Illness: 70 yo male w/ multiple medical issues presents for E/M of elevating PSA trend--referred by Dr Jacklyn Shell.  12.14.2020--PSA 2.1 9.12.2022--PSA 3.85  Currently, not complaining about significant lower urinary tract symptoms.  IPSS 3, quality-of-life score 1.  No family history of prostate cancer  He is on testosterone repletion, 100 mg IM twice a week, on Mondays and Thursdays.  He has been on this intermittently for quite a few years.  He does have erectile dysfunction.  He is a smoker times many years.  Past Medical History:  Diagnosis Date   Abnormal cardiovascular stress test 03/2013   Anxiety    Arthritis    Back pain    CHF (congestive heart failure) ([facility])    Coronary artery disease    Multivessel with significant LAD involvement by chest CT 2014   Depression    Diastolic dysfunction    Essential hypertension    Lumbar herniated disc    L4-L5   MRSA (methicillin resistant staph aureus) culture positive    Shortness of breath dyspnea    Sleep apnea    No cpap   Type 2 diabetes mellitus [facility])     Past Surgical History:  Procedure Laterality Date   ANTERIOR CERVICAL DECOMP/DISCECTOMY FUSION  01/18/2011   Procedure: ANTERIOR CERVICAL DECOMPRESSION/DISCECTOMY FUSION 2 LEVELS;  Surgeon: Cooper Render Pool;  Location: [facility];  Service: Neurosurgery;  Laterality: N/A;  anterior cervical discectomy and fusion with allograft and plating, cervical five-six, cervical six-seven   BIOPSY N/A 04/04/2015   Procedure: BIOPSY;  Surgeon: Rogene Houston, MD;  [facility];  Service: Endoscopy;  Laterality: N/A;   BIOPSY  11/09/2019   Procedure: BIOPSY;  Surgeon: Harvel Quale, MD;  [facility];  Service: Gastroenterology;;   Bone spur removed  1997   Left shoulder   CARDIAC CATHETERIZATION N/A 08/08/2015   Procedure: Right/Left Heart Cath and Coronary Angiography;  Surgeon: Sherren Mocha,  MD;  Location: [facility];  Service: Cardiovascular;  Laterality: N/A;   CARPAL TUNNEL RELEASE Right    COLONOSCOPY WITH PROPOFOL N/A 11/09/2019   Procedure: COLONOSCOPY WITH PROPOFOL;  Surgeon: Harvel Quale, MD;  [facility];  Service: Gastroenterology;  Laterality: N/A;  900   ESOPHAGEAL DILATION N/A 04/04/2015   Procedure: ESOPHAGEAL DILATION;  Surgeon: Rogene Houston, MD;  [facility];  Service: Endoscopy;  Laterality: N/A;   ESOPHAGEAL DILATION N/A 11/09/2019   Procedure: ESOPHAGEAL DILATION;  Surgeon: Harvel Quale, MD;  [facility];  Service: Gastroenterology;  Laterality: N/A;   ESOPHAGOGASTRODUODENOSCOPY N/A 04/04/2015   Procedure: ESOPHAGOGASTRODUODENOSCOPY (EGD);  Surgeon: Rogene Houston, MD;  [facility];  Service: Endoscopy;  Laterality: N/A;  1240   ESOPHAGOGASTRODUODENOSCOPY (EGD) WITH PROPOFOL N/A 11/09/2019   Procedure: ESOPHAGOGASTRODUODENOSCOPY (EGD) WITH PROPOFOL;  Surgeon: Harvel Quale, MD;  [facility];  Service: Gastroenterology;  Laterality: N/A;   HEMORRHOID SURGERY     POLYPECTOMY  11/09/2019   Procedure: POLYPECTOMY;  Surgeon: Harvel Quale, MD;  [facility];  Service: Gastroenterology;;   TONSILLECTOMY      Home Medications:  Allergies as of 01/27/2021       Reactions   Morphine And Related Nausea And Vomiting        Medication List        Accurate as of January 24, 2021  4:47 PM. If you  have any questions, ask your nurse or doctor.          Accu-Chek Aviva Plus test strip Generic drug: glucose blood USE AS INSTRUCTED   aspirin EC 81 MG tablet Take 81 mg by mouth daily.   atorvastatin 80 MG tablet Commonly known as: LIPITOR TAKE 1 TABLET BY MOUTH EVERY DAY   furosemide 40 MG tablet Commonly known as: LASIX Take 1 tablet (40 mg total) by mouth daily.   gabapentin 300 MG capsule Commonly known as: NEURONTIN TAKE  1 CAPSULE BY MOUTH TWICE A DAY   lisinopril 20 MG tablet Commonly known as: ZESTRIL Take 1 tablet (20 mg total) by mouth daily.   metFORMIN 500 MG tablet Commonly known as: GLUCOPHAGE TAKE 2 TABLETS TWICE A DAY   metoprolol succinate 25 MG 24 hr tablet Commonly known as: TOPROL-XL TAKE 1 TABLET BY MOUTH EVERYDAY AT BEDTIME   multivitamin with minerals Tabs tablet Take 1 tablet by mouth daily.   nitroGLYCERIN 0.4 MG SL tablet Commonly known as: NITROSTAT TAKE 1 TABLET UNDER TONGUE EVERY 5 MINS FOR CHEST PAIN GO TO ED AFTER 2ND DOSE   testosterone cypionate 200 MG/ML injection Commonly known as: DEPOTESTOSTERONE CYPIONATE Inject 0.5 mLs (100 mg total) into the muscle 2 (two) times a week.        Allergies:  Allergies  Allergen Reactions   Morphine And Related Nausea And Vomiting    Family History  Problem Relation Age of Onset   Lung cancer Mother    Diabetes Father    Hepatitis B Father    Diabetes Sister    Cushing syndrome Sister     Social History:  reports that he has been smoking cigarettes. He started smoking about 53 years ago. He has a 50.00 pack-year smoking history. He has quit using smokeless tobacco. He reports current alcohol use of about 1.0 standard drink per week. He reports current drug use. Frequency: 1.00 time per week. Drug: Marijuana.  ROS: A complete review of systems was performed.  All systems are negative except for pertinent findings as noted.  Physical Exam:  Vital signs in last 24 hours: There were no vitals taken for this visit. Constitutional:  Alert and oriented, No acute distress Cardiovascular: Regular rate  Respiratory: Normal respiratory effort GI: Abdomen is soft, nontender, nondistended, no abdominal masses. No CVAT.  Genitourinary: Normal male phallus, testes are descended bilaterally and non-tender and without masses, scrotum is normal in appearance without lesions or masses, perineum is normal on inspection.  Normal anal  sphincter tone.  Prostate 40 mL, symmetrical, nonnodular, nontender Lymphatic: No lymphadenopathy Neurologic: Grossly intact, no focal deficits Psychiatric: Normal mood and affect  I have reviewed prior pt notes  I have reviewed notes from referring/previous physicians  I have reviewed urinalysis results  I have independently reviewed prior imaging  I have reviewed prior PSA/testosterone results     Impression/Assessment:  1.  Elevating PSA trend with fairly normal exam today.  On testosterone repletion  2.  Hypogonadism, on what I think is probably supraphysiologic doses of testosterone  3.  Erectile dysfunction, smoker/diabetic  Plan:  1.  I recommended that he stop smoking  2.  He would like to trial of sildenafil-that was prescribed  3.  I will recheck PSA today, and we will check testosterone levels.  Follow-up dependent upon these

## 2021-01-27 ENCOUNTER — Ambulatory Visit: Payer: Medicare HMO | Admitting: Urology

## 2021-01-27 ENCOUNTER — Other Ambulatory Visit: Payer: Self-pay

## 2021-01-27 ENCOUNTER — Encounter: Payer: Self-pay | Admitting: Urology

## 2021-01-27 VITALS — BP 154/63 | HR 61 | Temp 98.5°F

## 2021-01-27 DIAGNOSIS — R972 Elevated prostate specific antigen [PSA]: Secondary | ICD-10-CM | POA: Diagnosis not present

## 2021-01-27 DIAGNOSIS — R7989 Other specified abnormal findings of blood chemistry: Secondary | ICD-10-CM | POA: Diagnosis not present

## 2021-01-27 DIAGNOSIS — N5201 Erectile dysfunction due to arterial insufficiency: Secondary | ICD-10-CM

## 2021-01-27 MED ORDER — SILDENAFIL CITRATE 100 MG PO TABS: ORAL_TABLET | ORAL | 99 refills | Status: DC

## 2021-01-27 NOTE — Progress Notes (Signed)
Urological Symptom Review  Patient is experiencing the following symptoms: N/a   Review of Systems  Gastrointestinal (upper)  : Negative for upper GI symptoms  Gastrointestinal (lower) : Diarrhea  Constitutional : Negative for symptoms  Skin: Negative for skin symptoms  Eyes: Negative for eye symptoms  Ear/Nose/Throat : Negative for Ear/Nose/Throat symptoms  Hematologic/Lymphatic: Negative for Hematologic/Lymphatic symptoms  Cardiovascular : Negative for cardiovascular symptoms  Respiratory : Negative for respiratory symptoms  Endocrine: Negative for endocrine symptoms  Musculoskeletal: Back pain Joint pain  Neurological: Negative for neurological symptoms  Psychologic: Negative for psychiatric symptoms

## 2021-01-29 ENCOUNTER — Other Ambulatory Visit: Payer: Self-pay | Admitting: Urology

## 2021-02-03 LAB — TESTOSTERONE, FREE: Testosterone, Free: 2.9 pg/mL — ABNORMAL LOW (ref 6.6–18.1)

## 2021-02-03 LAB — PSA: Prostate Specific Ag, Serum: 4.9 ng/mL — ABNORMAL HIGH (ref 0.0–4.0)

## 2021-02-09 ENCOUNTER — Other Ambulatory Visit: Payer: Self-pay

## 2021-02-09 ENCOUNTER — Ambulatory Visit (INDEPENDENT_AMBULATORY_CARE_PROVIDER_SITE_OTHER): Payer: Medicare HMO | Admitting: Gastroenterology

## 2021-02-09 ENCOUNTER — Encounter (INDEPENDENT_AMBULATORY_CARE_PROVIDER_SITE_OTHER): Payer: Self-pay | Admitting: Gastroenterology

## 2021-02-09 DIAGNOSIS — K209 Esophagitis, unspecified without bleeding: Secondary | ICD-10-CM | POA: Diagnosis not present

## 2021-02-09 DIAGNOSIS — Z860100 Personal history of colon polyps, unspecified: Secondary | ICD-10-CM | POA: Insufficient documentation

## 2021-02-09 DIAGNOSIS — K31A11 Gastric intestinal metaplasia without dysplasia, involving the antrum: Secondary | ICD-10-CM | POA: Diagnosis not present

## 2021-02-09 MED ORDER — OMEPRAZOLE 40 MG PO CPDR
40.0000 mg | DELAYED_RELEASE_CAPSULE | Freq: Every day | ORAL | 3 refills | Status: DC
Start: 2021-02-09 — End: 2021-02-11

## 2021-02-09 NOTE — H&P (View-Only) (Signed)
Maylon Peppers, M.D. Gastroenterology & Hepatology [facility] [address] [place], [place] [address]  Primary Care Physician: Wendie Agreste, MD [address] [place] [address]  I will communicate my assessment and recommendations to the referring MD via EMR.  Problems: Esophagitis History of colon polyps  History of Present Illness: Jonathan Gilmore is a 70 y.o. male with Pmh anxiety, diabetes, CHF HTN, depression, CAD, who presents for follow up of grade B esophagitis.  The patient was last seen on 10/25/2019. At that time, the patient was scheduled for an EGD for evaluation of dysphagia and underwent a colonoscopy due to history of colon polyps.  He underwent both procedures on 11/09/2019. esophagogastroduodenospy showed an irregular Z-line, esophagus was within normal limits and was dilated up to 18 mm, there was presence of grade B esophagitis, there was presence of erythema in the stomach (pathology of the stomach showed presence of changes consistent with intestinal metaplasia in the antrum but negative for H. pylori), there was presence of a nonbleeding duodenal ulcer Forrest III in the duodenal bulb.  Patient was started on Protonix 40 mg twice a day was advised to avoid NSAIDs.  Colonoscopy showed presence of a fair colon prep -he had a total of 5 polyps removed from the colon with polyps up to 20 mm x 2 and a 12 mm polyps x1 (pathology consistent with tubular adenomas), diverticulosis.  Patient was advised to have repeat colonoscopy in 6 months and a repeat EGD in 3 years.  The patient denies having any dysphagia or odynophagia, has not present any episodes of heartburn or chest discomfort.  He is not currently taking any PPI.  Patient reports that he has intermittent episodes of constipation, but these have been infrequent. He reports having a Bm almost every day, sometimes two times a day. Has very seldom constipation,  especially when eating cheese. Denies any bloating or abdominal pain. The patient denies having any nausea, vomiting, fever, chills, hematochezia, melena, hematemesis,diarrhea, jaundice, pruritus.  Has been trying to lose weight intentionally by changing his diet.  Patient has been under significant stress recently.  Last EGD: as above Last Colonoscopy: as above  Past Medical History: Past Medical History:  Diagnosis Date   Abnormal cardiovascular stress test 03/2013   Anxiety    Arthritis    Back pain    CHF (congestive heart failure) (HCC)    Coronary artery disease    Multivessel with significant LAD involvement by chest CT 2014   Depression    Diastolic dysfunction    Essential hypertension    Lumbar herniated disc    L4-L5   MRSA (methicillin resistant staph aureus) culture positive    Shortness of breath dyspnea    Sleep apnea    No cpap   Type 2 diabetes mellitus ([place])     Past Surgical History: Past Surgical History:  Procedure Laterality Date   ANTERIOR CERVICAL DECOMP/DISCECTOMY FUSION  01/18/2011   Procedure: ANTERIOR CERVICAL DECOMPRESSION/DISCECTOMY FUSION 2 LEVELS;  Surgeon: Cooper Render Pool;  Location: [place] [facility];  Service: Neurosurgery;  Laterality: N/A;  anterior cervical discectomy and fusion with allograft and plating, cervical five-six, cervical six-seven   BIOPSY N/A 04/04/2015   Procedure: BIOPSY;  Surgeon: Rogene Houston, MD;  [facility];  Service: Endoscopy;  Laterality: N/A;   BIOPSY  11/09/2019   Procedure: BIOPSY;  Surgeon: Harvel Quale, MD;  [facility];  Service:  Gastroenterology;;   Bone spur removed  1997   Left shoulder   CARDIAC CATHETERIZATION N/A 08/08/2015   Procedure: Right/Left Heart Cath and Coronary Angiography;  Surgeon: Sherren Mocha, MD;  Location: [place] [facility];  Service: Cardiovascular;  Laterality: N/A;   CARPAL TUNNEL RELEASE Right    COLONOSCOPY WITH PROPOFOL N/A 11/09/2019   Procedure:  COLONOSCOPY WITH PROPOFOL;  Surgeon: Harvel Quale, MD;  [facility];  Service: Gastroenterology;  Laterality: N/A;  900   ESOPHAGEAL DILATION N/A 04/04/2015   Procedure: ESOPHAGEAL DILATION;  Surgeon: Rogene Houston, MD;  [facility];  Service: Endoscopy;  Laterality: N/A;   ESOPHAGEAL DILATION N/A 11/09/2019   Procedure: ESOPHAGEAL DILATION;  Surgeon: Harvel Quale, MD;  [facility];  Service: Gastroenterology;  Laterality: N/A;   ESOPHAGOGASTRODUODENOSCOPY N/A 04/04/2015   Procedure: ESOPHAGOGASTRODUODENOSCOPY (EGD);  Surgeon: Rogene Houston, MD;  [facility];  Service: Endoscopy;  Laterality: N/A;  1240   ESOPHAGOGASTRODUODENOSCOPY (EGD) WITH PROPOFOL N/A 11/09/2019   Procedure: ESOPHAGOGASTRODUODENOSCOPY (EGD) WITH PROPOFOL;  Surgeon: Harvel Quale, MD;  [facility];  Service: Gastroenterology;  Laterality: N/A;   HEMORRHOID SURGERY     POLYPECTOMY  11/09/2019   Procedure: POLYPECTOMY;  Surgeon: Montez Morita, Quillian Quince, MD;  [facility];  Service: Gastroenterology;;   TONSILLECTOMY      Family History: Family History  Problem Relation Age of Onset   Lung cancer Mother    Diabetes Father    Hepatitis B Father    Diabetes Sister    Cushing syndrome Sister     Social History: Social History   Tobacco Use  Smoking Status Every Day   Packs/day: 1.00   Years: 50.00   Pack years: 50.00   Types: Cigarettes   Start date: 11/26/1967  Smokeless Tobacco Former  Tobacco Comments   smoking x 50 yrs   Social History   Substance and Sexual Activity  Alcohol Use Yes   Alcohol/week: 1.0 standard drink   Types: 1 Standard drinks or equivalent per week   Comment: rare   Social History   Substance and Sexual Activity  Drug Use Yes   Frequency: 1.0 times per week   Types: Marijuana   Comment: as needed    Allergies: Allergies  Allergen Reactions   Morphine And Related  Nausea And Vomiting    Medications: Current Outpatient Medications  Medication Sig Dispense Refill   ACCU-CHEK AVIVA PLUS test strip USE AS INSTRUCTED 100 strip 3   aspirin EC 81 MG tablet Take 81 mg by mouth daily.     atorvastatin (LIPITOR) 80 MG tablet TAKE 1 TABLET BY MOUTH EVERY DAY 90 tablet 2   furosemide (LASIX) 40 MG tablet Take 1 tablet (40 mg total) by mouth daily. 90 tablet 3   gabapentin (NEURONTIN) 300 MG capsule TAKE 1 CAPSULE BY MOUTH TWICE A DAY 180 capsule 1   lisinopril (ZESTRIL) 20 MG tablet Take 1 tablet (20 mg total) by mouth daily. 90 tablet 3   metFORMIN (GLUCOPHAGE) 500 MG tablet TAKE 2 TABLETS TWICE A DAY 360 tablet 0   metoprolol succinate (TOPROL-XL) 25 MG 24 hr tablet TAKE 1 TABLET BY MOUTH EVERYDAY AT BEDTIME 90 tablet 3   Multiple Vitamin (MULTIVITAMIN WITH MINERALS) TABS Take 1 tablet by mouth daily.     nitroGLYCERIN (NITROSTAT) 0.4 MG SL tablet TAKE 1 TABLET UNDER TONGUE EVERY 5 MINS FOR CHEST PAIN GO TO ED AFTER 2ND DOSE 25  tablet 2   omeprazole (PRILOSEC) 40 MG capsule Take 1 capsule (40 mg total) by mouth daily. 90 capsule 3   sildenafil (VIAGRA) 100 MG tablet 1/2-1 p.o. as needed 20 tablet 99   testosterone cypionate (DEPOTESTOSTERONE CYPIONATE) 200 MG/ML injection Inject 0.5 mLs (100 mg total) into the muscle 2 (two) times a week. 10 mL 2   No current facility-administered medications for this visit.    Review of Systems: GENERAL: negative for malaise, night sweats HEENT: No changes in hearing or vision, no nose bleeds or other nasal problems. NECK: Negative for lumps, goiter, pain and significant neck swelling RESPIRATORY: Negative for cough, wheezing CARDIOVASCULAR: Negative for chest pain, leg swelling, palpitations, orthopnea GI: SEE HPI MUSCULOSKELETAL: Negative for joint pain or swelling, back pain, and muscle pain. SKIN: Negative for lesions, rash PSYCH: Negative for sleep disturbance, mood disorder and recent psychosocial  stressors. HEMATOLOGY Negative for prolonged bleeding, bruising easily, and swollen nodes. ENDOCRINE: Negative for cold or heat intolerance, polyuria, polydipsia and goiter. NEURO: negative for tremor, gait imbalance, syncope and seizures. The remainder of the review of systems is noncontributory.   Physical Exam: BP 126/65 (BP Location: Right Arm, Patient Position: Sitting, Cuff Size: Normal)    Temp 98.5 F (36.9 C) (Oral)    Ht 5\' 6"  (1.676 m)    Wt 186 lb 3.2 oz (84.5 kg)    BMI 30.05 kg/m  GENERAL: The patient is AO x3, in no acute distress. HEENT: Head is normocephalic and atraumatic. EOMI are intact. Mouth is well hydrated and without lesions. NECK: Supple. No masses LUNGS: Clear to auscultation. No presence of rhonchi/wheezing/rales. Adequate chest expansion HEART: RRR, normal s1 and s2. ABDOMEN: Soft, nontender, no guarding, no peritoneal signs, and nondistended. BS +. No masses. EXTREMITIES: Without any cyanosis, clubbing, rash, lesions or edema. NEUROLOGIC: AOx3, no focal motor deficit. SKIN: no jaundice, no rashes  Imaging/Labs: as above  I personally reviewed and interpreted the available labs, imaging and endoscopic files.  Impression and Plan: Jonathan Gilmore is a 70 y.o. male with Pmh anxiety, diabetes, CHF HTN, depression, CAD, who presents for follow up of grade B esophagitis.  Patient has no presented any symptoms for ongoing GERD symptoms, however he was found to have esophagitis during his most recent endoscopic evaluation.  I discussed with him the importance of being on a PPI at least once a day to avoid recurrent issues with GERD and complications such as strictures or developing Barrett's esophagus/esophageal adenocarcinoma.  Patient understood and agreed.  We will start him on omeprazole 40 mg every day.  Patient was also found to have intestinal metaplasia during his most recent EGD in the antrum.  We will perform repeat EGD for surveillance in 3 years with  gastric mapping-he is at low risk given his ethnicity, lack of H. pylori in biopsies and negative family history of gastric cancer.  He is also due for repeat colonoscopy given his fair prep and large polyps.  We will schedule a colonoscopy.  He understood and agreed.  - Schedule colonoscopy - will need a two day prep - Start omeprazole 40 mg qday  All questions were answered.      Harvel Quale, MD Gastroenterology and Hepatology [facility]

## 2021-02-09 NOTE — Progress Notes (Signed)
Jonathan Gilmore, M.D. Gastroenterology & Hepatology [facility] [address] [place], [place] [address]  Primary Care Physician: Wendie Agreste, MD [address] [place] [address]  I will communicate my assessment and recommendations to the referring MD via EMR.  Problems: Esophagitis History of colon polyps  History of Present Illness: Jonathan Gilmore is a 70 y.o. male with Pmh anxiety, diabetes, CHF HTN, depression, CAD, who presents for follow up of grade B esophagitis.  The patient was last seen on 10/25/2019. At that time, the patient was scheduled for an EGD for evaluation of dysphagia and underwent a colonoscopy due to history of colon polyps.  He underwent both procedures on 11/09/2019. esophagogastroduodenospy showed an irregular Z-line, esophagus was within normal limits and was dilated up to 18 mm, there was presence of grade B esophagitis, there was presence of erythema in the stomach (pathology of the stomach showed presence of changes consistent with intestinal metaplasia in the antrum but negative for H. pylori), there was presence of a nonbleeding duodenal ulcer Forrest III in the duodenal bulb.  Patient was started on Protonix 40 mg twice a day was advised to avoid NSAIDs.  Colonoscopy showed presence of a fair colon prep -he had a total of 5 polyps removed from the colon with polyps up to 20 mm x 2 and a 12 mm polyps x1 (pathology consistent with tubular adenomas), diverticulosis.  Patient was advised to have repeat colonoscopy in 6 months and a repeat EGD in 3 years.  The patient denies having any dysphagia or odynophagia, has not present any episodes of heartburn or chest discomfort.  He is not currently taking any PPI.  Patient reports that he has intermittent episodes of constipation, but these have been infrequent. He reports having a Bm almost every day, sometimes two times a day. Has very seldom constipation,  especially when eating cheese. Denies any bloating or abdominal pain. The patient denies having any nausea, vomiting, fever, chills, hematochezia, melena, hematemesis,diarrhea, jaundice, pruritus.  Has been trying to lose weight intentionally by changing his diet.  Patient has been under significant stress recently.  Last EGD: as above Last Colonoscopy: as above  Past Medical History: Past Medical History:  Diagnosis Date   Abnormal cardiovascular stress test 03/2013   Anxiety    Arthritis    Back pain    CHF (congestive heart failure) (HCC)    Coronary artery disease    Multivessel with significant LAD involvement by chest CT 2014   Depression    Diastolic dysfunction    Essential hypertension    Lumbar herniated disc    L4-L5   MRSA (methicillin resistant staph aureus) culture positive    Shortness of breath dyspnea    Sleep apnea    No cpap   Type 2 diabetes mellitus ([place])     Past Surgical History: Past Surgical History:  Procedure Laterality Date   ANTERIOR CERVICAL DECOMP/DISCECTOMY FUSION  01/18/2011   Procedure: ANTERIOR CERVICAL DECOMPRESSION/DISCECTOMY FUSION 2 LEVELS;  Surgeon: Cooper Render Pool;  Location: [place] [facility];  Service: Neurosurgery;  Laterality: N/A;  anterior cervical discectomy and fusion with allograft and plating, cervical five-six, cervical six-seven   BIOPSY N/A 04/04/2015   Procedure: BIOPSY;  Surgeon: Rogene Houston, MD;  [facility];  Service: Endoscopy;  Laterality: N/A;   BIOPSY  11/09/2019   Procedure: BIOPSY;  Surgeon: Harvel Quale, MD;  [facility];  Service:  Gastroenterology;;   Bone spur removed  1997   Left shoulder   CARDIAC CATHETERIZATION N/A 08/08/2015   Procedure: Right/Left Heart Cath and Coronary Angiography;  Surgeon: Sherren Mocha, MD;  Location: [place] [facility];  Service: Cardiovascular;  Laterality: N/A;   CARPAL TUNNEL RELEASE Right    COLONOSCOPY WITH PROPOFOL N/A 11/09/2019   Procedure:  COLONOSCOPY WITH PROPOFOL;  Surgeon: Harvel Quale, MD;  [facility];  Service: Gastroenterology;  Laterality: N/A;  900   ESOPHAGEAL DILATION N/A 04/04/2015   Procedure: ESOPHAGEAL DILATION;  Surgeon: Rogene Houston, MD;  [facility];  Service: Endoscopy;  Laterality: N/A;   ESOPHAGEAL DILATION N/A 11/09/2019   Procedure: ESOPHAGEAL DILATION;  Surgeon: Harvel Quale, MD;  [facility];  Service: Gastroenterology;  Laterality: N/A;   ESOPHAGOGASTRODUODENOSCOPY N/A 04/04/2015   Procedure: ESOPHAGOGASTRODUODENOSCOPY (EGD);  Surgeon: Rogene Houston, MD;  [facility];  Service: Endoscopy;  Laterality: N/A;  1240   ESOPHAGOGASTRODUODENOSCOPY (EGD) WITH PROPOFOL N/A 11/09/2019   Procedure: ESOPHAGOGASTRODUODENOSCOPY (EGD) WITH PROPOFOL;  Surgeon: Harvel Quale, MD;  [facility];  Service: Gastroenterology;  Laterality: N/A;   HEMORRHOID SURGERY     POLYPECTOMY  11/09/2019   Procedure: POLYPECTOMY;  Surgeon: Montez Morita, Quillian Quince, MD;  [facility];  Service: Gastroenterology;;   TONSILLECTOMY      Family History: Family History  Problem Relation Age of Onset   Lung cancer Mother    Diabetes Father    Hepatitis B Father    Diabetes Sister    Cushing syndrome Sister     Social History: Social History   Tobacco Use  Smoking Status Every Day   Packs/day: 1.00   Years: 50.00   Pack years: 50.00   Types: Cigarettes   Start date: 11/26/1967  Smokeless Tobacco Former  Tobacco Comments   smoking x 50 yrs   Social History   Substance and Sexual Activity  Alcohol Use Yes   Alcohol/week: 1.0 standard drink   Types: 1 Standard drinks or equivalent per week   Comment: rare   Social History   Substance and Sexual Activity  Drug Use Yes   Frequency: 1.0 times per week   Types: Marijuana   Comment: as needed    Allergies: Allergies  Allergen Reactions   Morphine And Related  Nausea And Vomiting    Medications: Current Outpatient Medications  Medication Sig Dispense Refill   ACCU-CHEK AVIVA PLUS test strip USE AS INSTRUCTED 100 strip 3   aspirin EC 81 MG tablet Take 81 mg by mouth daily.     atorvastatin (LIPITOR) 80 MG tablet TAKE 1 TABLET BY MOUTH EVERY DAY 90 tablet 2   furosemide (LASIX) 40 MG tablet Take 1 tablet (40 mg total) by mouth daily. 90 tablet 3   gabapentin (NEURONTIN) 300 MG capsule TAKE 1 CAPSULE BY MOUTH TWICE A DAY 180 capsule 1   lisinopril (ZESTRIL) 20 MG tablet Take 1 tablet (20 mg total) by mouth daily. 90 tablet 3   metFORMIN (GLUCOPHAGE) 500 MG tablet TAKE 2 TABLETS TWICE A DAY 360 tablet 0   metoprolol succinate (TOPROL-XL) 25 MG 24 hr tablet TAKE 1 TABLET BY MOUTH EVERYDAY AT BEDTIME 90 tablet 3   Multiple Vitamin (MULTIVITAMIN WITH MINERALS) TABS Take 1 tablet by mouth daily.     nitroGLYCERIN (NITROSTAT) 0.4 MG SL tablet TAKE 1 TABLET UNDER TONGUE EVERY 5 MINS FOR CHEST PAIN GO TO ED AFTER 2ND DOSE 25  tablet 2   omeprazole (PRILOSEC) 40 MG capsule Take 1 capsule (40 mg total) by mouth daily. 90 capsule 3   sildenafil (VIAGRA) 100 MG tablet 1/2-1 p.o. as needed 20 tablet 99   testosterone cypionate (DEPOTESTOSTERONE CYPIONATE) 200 MG/ML injection Inject 0.5 mLs (100 mg total) into the muscle 2 (two) times a week. 10 mL 2   No current facility-administered medications for this visit.    Review of Systems: GENERAL: negative for malaise, night sweats HEENT: No changes in hearing or vision, no nose bleeds or other nasal problems. NECK: Negative for lumps, goiter, pain and significant neck swelling RESPIRATORY: Negative for cough, wheezing CARDIOVASCULAR: Negative for chest pain, leg swelling, palpitations, orthopnea GI: SEE HPI MUSCULOSKELETAL: Negative for joint pain or swelling, back pain, and muscle pain. SKIN: Negative for lesions, rash PSYCH: Negative for sleep disturbance, mood disorder and recent psychosocial  stressors. HEMATOLOGY Negative for prolonged bleeding, bruising easily, and swollen nodes. ENDOCRINE: Negative for cold or heat intolerance, polyuria, polydipsia and goiter. NEURO: negative for tremor, gait imbalance, syncope and seizures. The remainder of the review of systems is noncontributory.   Physical Exam: BP 126/65 (BP Location: Right Arm, Patient Position: Sitting, Cuff Size: Normal)   Temp 98.5 F (36.9 C) (Oral)   Ht 5\' 6"  (1.676 m)   Wt 186 lb 3.2 oz (84.5 kg)   BMI 30.05 kg/m  GENERAL: The patient is AO x3, in no acute distress. HEENT: Head is normocephalic and atraumatic. EOMI are intact. Mouth is well hydrated and without lesions. NECK: Supple. No masses LUNGS: Clear to auscultation. No presence of rhonchi/wheezing/rales. Adequate chest expansion HEART: RRR, normal s1 and s2. ABDOMEN: Soft, nontender, no guarding, no peritoneal signs, and nondistended. BS +. No masses. EXTREMITIES: Without any cyanosis, clubbing, rash, lesions or edema. NEUROLOGIC: AOx3, no focal motor deficit. SKIN: no jaundice, no rashes  Imaging/Labs: as above  I personally reviewed and interpreted the available labs, imaging and endoscopic files.  Impression and Plan: Jonathan Gilmore is a 70 y.o. male with Pmh anxiety, diabetes, CHF HTN, depression, CAD, who presents for follow up of grade B esophagitis.  Patient has no presented any symptoms for ongoing GERD symptoms, however he was found to have esophagitis during his most recent endoscopic evaluation.  I discussed with him the importance of being on a PPI at least once a day to avoid recurrent issues with GERD and complications such as strictures or developing Barrett's esophagus/esophageal adenocarcinoma.  Patient understood and agreed.  We will start him on omeprazole 40 mg every day.  Patient was also found to have intestinal metaplasia during his most recent EGD in the antrum.  We will perform repeat EGD for surveillance in 3 years with  gastric mapping-he is at low risk given his ethnicity, lack of H. pylori in biopsies and negative family history of gastric cancer.  He is also due for repeat colonoscopy given his fair prep and large polyps.  We will schedule a colonoscopy.  He understood and agreed.  - Schedule colonoscopy - will need a two day prep - Start omeprazole 40 mg qday  All questions were answered.      Harvel Quale, MD Gastroenterology and Hepatology [facility]

## 2021-02-09 NOTE — Patient Instructions (Signed)
Schedule colonoscopy - will need a two day prep Start omeprazole 40 mg qday

## 2021-02-11 ENCOUNTER — Telehealth (INDEPENDENT_AMBULATORY_CARE_PROVIDER_SITE_OTHER): Payer: Self-pay

## 2021-02-11 ENCOUNTER — Ambulatory Visit: Payer: Medicare HMO | Admitting: Cardiology

## 2021-02-11 ENCOUNTER — Encounter: Payer: Self-pay | Admitting: *Deleted

## 2021-02-11 ENCOUNTER — Other Ambulatory Visit (INDEPENDENT_AMBULATORY_CARE_PROVIDER_SITE_OTHER): Payer: Self-pay

## 2021-02-11 VITALS — BP 146/72 | HR 70 | Resp 20 | Ht 66.0 in | Wt 190.0 lb

## 2021-02-11 DIAGNOSIS — I350 Nonrheumatic aortic (valve) stenosis: Secondary | ICD-10-CM | POA: Diagnosis not present

## 2021-02-11 DIAGNOSIS — I5032 Chronic diastolic (congestive) heart failure: Secondary | ICD-10-CM | POA: Diagnosis not present

## 2021-02-11 DIAGNOSIS — E782 Mixed hyperlipidemia: Secondary | ICD-10-CM | POA: Diagnosis not present

## 2021-02-11 DIAGNOSIS — I10 Essential (primary) hypertension: Secondary | ICD-10-CM

## 2021-02-11 DIAGNOSIS — R079 Chest pain, unspecified: Secondary | ICD-10-CM

## 2021-02-11 DIAGNOSIS — Z86010 Personal history of colonic polyps: Secondary | ICD-10-CM

## 2021-02-11 DIAGNOSIS — I2510 Atherosclerotic heart disease of native coronary artery without angina pectoris: Secondary | ICD-10-CM

## 2021-02-11 MED ORDER — NITROGLYCERIN 0.4 MG SL SUBL: 0.4000 mg | SUBLINGUAL_TABLET | SUBLINGUAL | 2 refills | Status: DC | PRN

## 2021-02-11 MED ORDER — PEG 3350-KCL-NA BICARB-NACL 420 G PO SOLR: 4000.0000 mL | ORAL | 0 refills | Status: DC

## 2021-02-11 NOTE — Telephone Encounter (Signed)
LeighAnn Brixon Zhen, CMA  

## 2021-02-11 NOTE — Progress Notes (Signed)
Clinical Summary Jonathan Gilmore is a 70 y.o.male seen today for follow up of the following medical problems.      1. CAD - CT scan chest 02/2013 with 3 vessel CAD, severe LAD disease. - Jan 2015 nuclear stress without clear ischemia - admit 07/2015 with acute pulmonary edema in setting of severe HTN. Referred for cath.  - cath 07/2015 with 50% LAD disease, overall patent coronaries - his presentation with pulmonary edema likely secondary to severe HTN in setting of restrictive diastolic dysfunction. - echo 07/2015 LVEF 45-50%, restrictive diastolic dysfunction 08/7589 echo LVEF 60-65%   - recent chest pains, some SOB - started 2 days ago. Pressure epigastric/midchest, 3/10 in severity. Some associated SOB few seconds.. Lasted a few hours constant, not positional. No association with food or eating.  - no repeat episodes -compliant with meds.    2. Severe OSA - not using CPAP machine due to discomfort. Stopped several years ago     3. Hyperlipidemia -11/2018 TC 82 HDL 25 TG 137 LDL 35 - he is compliant with statin   - 08/2019 TC 106 HDL 37 TG 100 LDL 51  - 04/2020 TC 103 HDL 35 TG 143 LDL 45 - 11/2020 TC 87 HDL 35 LDL 38 TG 69   4. Aortic stenosis - echo 07/2015 mild to moderate. Mean gradient 16, AVA reported at 0.9 with dimensionless index 0.17 Mar 2018 echo LVEF 60-65%, mild to moderate stenosis  08/2020 echo LVEF 60-65%, no WMAs, grade I dd, mild MR, mild AI, mild to mod AS mean grade 17, AVA VTI 1.12 DI 0.29   5. HTN - compliant withmeds     6. Chronic diastolic HF - no recent edema   7. DM2  - followed by pcp     8. Claudication/PAD - followed by vascular   9. Dysphagia - episode of food getting stuck, reports prior issues along with esophageal dilatation in the past. Asking to reestablish with GI, reports also due for colonscopy       AAA screen: 2017 [place] was normal   SH: his wife, Jonathan Gilmore is also a patient of mine. 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   Past Medical History:  Diagnosis Date   Abnormal cardiovascular stress test 03/2013   Anxiety    Arthritis    Back pain    CHF (congestive heart failure) ([place])    Coronary artery disease    Multivessel with significant LAD involvement by chest CT 2014   Depression    Diastolic dysfunction    Essential hypertension    Lumbar herniated disc    L4-L5   MRSA (methicillin resistant staph aureus) culture positive    Shortness of breath dyspnea    Sleep apnea    No cpap   Type 2 diabetes mellitus (HCC)      Allergies  Allergen Reactions   Morphine And Related Nausea And Vomiting     Current Outpatient Medications  Medication Sig Dispense Refill   ACCU-CHEK AVIVA PLUS test strip USE AS INSTRUCTED 100 strip 3   aspirin EC 81 MG tablet Take 81 mg by mouth daily.     atorvastatin (LIPITOR) 80 MG tablet TAKE 1 TABLET BY MOUTH EVERY DAY 90 tablet 2   furosemide (LASIX) 40 MG tablet Take 1 tablet (40 mg total) by mouth daily. 90 tablet 3   gabapentin (NEURONTIN) 300 MG capsule TAKE 1 CAPSULE BY MOUTH TWICE  A DAY 180 capsule 1   lisinopril (ZESTRIL) 20 MG tablet Take 1 tablet (20 mg total) by mouth daily. 90 tablet 3   metFORMIN (GLUCOPHAGE) 500 MG tablet TAKE 2 TABLETS TWICE A DAY 360 tablet 0   metoprolol succinate (TOPROL-XL) 25 MG 24 hr tablet TAKE 1 TABLET BY MOUTH EVERYDAY AT BEDTIME 90 tablet 3   Multiple Vitamin (MULTIVITAMIN WITH MINERALS) TABS Take 1 tablet by mouth daily.     nitroGLYCERIN (NITROSTAT) 0.4 MG SL tablet TAKE 1 TABLET UNDER TONGUE EVERY 5 MINS FOR CHEST PAIN GO TO ED AFTER 2ND DOSE 25 tablet 2   omeprazole (PRILOSEC) 40 MG capsule Take 1 capsule (40 mg total) by mouth daily. 90 capsule 3   sildenafil (VIAGRA) 100 MG tablet 1/2-1 p.o. as needed 20 tablet 99   testosterone cypionate (DEPOTESTOSTERONE CYPIONATE) 200 MG/ML injection Inject 0.5 mLs (100 mg total) into the muscle 2 (two) times a week. 10 mL 2   No current  facility-administered medications for this visit.     Past Surgical History:  Procedure Laterality Date   ANTERIOR CERVICAL DECOMP/DISCECTOMY FUSION  01/18/2011   Procedure: ANTERIOR CERVICAL DECOMPRESSION/DISCECTOMY FUSION 2 LEVELS;  Surgeon: Cooper Render Pool;  Location: [facility];  Service: Neurosurgery;  Laterality: N/A;  anterior cervical discectomy and fusion with allograft and plating, cervical five-six, cervical six-seven   BIOPSY N/A 04/04/2015   Procedure: BIOPSY;  Surgeon: Rogene Houston, MD;  [facility];  Service: Endoscopy;  Laterality: N/A;   BIOPSY  11/09/2019   Procedure: BIOPSY;  Surgeon: Harvel Quale, MD;  Location: [facility];  Service: Gastroenterology;;   Bone spur removed  1997   Left shoulder   CARDIAC CATHETERIZATION N/A 08/08/2015   Procedure: Right/Left Heart Cath and Coronary Angiography;  Surgeon: Sherren Mocha, MD;  Location: [facility];  Service: Cardiovascular;  Laterality: N/A;   CARPAL TUNNEL RELEASE Right    COLONOSCOPY WITH PROPOFOL N/A 11/09/2019   Procedure: COLONOSCOPY WITH PROPOFOL;  Surgeon: Harvel Quale, MD;  [facility];  Service: Gastroenterology;  Laterality: N/A;  900   ESOPHAGEAL DILATION N/A 04/04/2015   Procedure: ESOPHAGEAL DILATION;  Surgeon: Rogene Houston, MD;  [facility];  Service: Endoscopy;  Laterality: N/A;   ESOPHAGEAL DILATION N/A 11/09/2019   Procedure: ESOPHAGEAL DILATION;  Surgeon: Harvel Quale, MD;  [facility];  Service: Gastroenterology;  Laterality: N/A;   ESOPHAGOGASTRODUODENOSCOPY N/A 04/04/2015   Procedure: ESOPHAGOGASTRODUODENOSCOPY (EGD);  Surgeon: Rogene Houston, MD;  [facility];  Service: Endoscopy;  Laterality: N/A;  1240   ESOPHAGOGASTRODUODENOSCOPY (EGD) WITH PROPOFOL N/A 11/09/2019   Procedure: ESOPHAGOGASTRODUODENOSCOPY (EGD) WITH PROPOFOL;  Surgeon: Harvel Quale, MD;  [facility];   Service: Gastroenterology;  Laterality: N/A;   HEMORRHOID SURGERY     POLYPECTOMY  11/09/2019   Procedure: POLYPECTOMY;  Surgeon: Harvel Quale, MD;  [facility];  Service: Gastroenterology;;   TONSILLECTOMY       Allergies  Allergen Reactions   Morphine And Related Nausea And Vomiting      Family History  Problem Relation Age of Onset   Lung cancer Mother    Diabetes Father    Hepatitis B Father    Diabetes Sister    Cushing syndrome Sister      Social History Jonathan Gilmore reports that he has been smoking cigarettes. He started smoking about 53 years ago. He has a 50.00 pack-year smoking  history. He has quit using smokeless tobacco. Jonathan Gilmore reports current alcohol use of about 1.0 standard drink per week.   Review of Systems CONSTITUTIONAL: No weight loss, fever, chills, weakness or fatigue.  HEENT: Eyes: No visual loss, blurred vision, double vision or yellow sclerae.No hearing loss, sneezing, congestion, runny nose or sore throat.  SKIN: No rash or itching.  CARDIOVASCULAR: per hpi RESPIRATORY: per hpi GASTROINTESTINAL: No anorexia, nausea, vomiting or diarrhea. No abdominal pain or blood.  GENITOURINARY: No burning on urination, no polyuria NEUROLOGICAL: No headache, dizziness, syncope, paralysis, ataxia, numbness or tingling in the extremities. No change in bowel or bladder control.  MUSCULOSKELETAL: No muscle, back pain, joint pain or stiffness.  LYMPHATICS: No enlarged nodes. No history of splenectomy.  PSYCHIATRIC: No history of depression or anxiety.  ENDOCRINOLOGIC: No reports of sweating, cold or heat intolerance. No polyuria or polydipsia.  Marland Kitchen   Physical Examination Today's Vitals   02/11/21 1048  BP: (!) 146/72  Pulse: 70  Resp: 20  SpO2: 97%  Weight: 190 lb (86.2 kg)  Height: 5\' 6"  (1.676 m)  PainSc: 2   PainLoc: Chest   Body mass index is 30.67 kg/m.  Gen: resting comfortably, no acute distress HEENT: no scleral icterus,  pupils equal round and reactive, no palptable cervical adenopathy,  CV: RRR, 3/6 systolic murmur rusb, no jvd Resp: Clear to auscultation bilaterally GI: abdomen is soft, non-tender, non-distended, normal bowel sounds, no hepatosplenomegaly MSK: extremities are warm, no edema.  Skin: warm, no rash Neuro:  no focal deficits Psych: appropriate affect   Diagnostic Studies  Jan 2015 MPI IMPRESSION: 1.  Abnormal exercise myocardial perfusion imaging stress test   2. Inferior wall defect likely due to subdiaphragmatic attenuation, its wall motion is similar to other areas without a perfusion defect.   3. Likely small anteroseptal infract, this area is more hypokinetic than the rest of the myocardium   3.  Low left ventricular systolic function with global hypokinesis   4. Mildly reduced functional capacity (90% of age and gender predicted)   5. Overall increased risk for major cardiac events based on low ejection fraction. There is no current myocardium at jeopardy.     02/2013 echo Study Conclusions  - Left ventricle: The cavity size was normal. There was mild   concentric hypertrophy. Systolic function was normal. The   estimated ejection fraction was in the range of 55% to   60%. Wall motion was normal; there were no regional wall   motion abnormalities. There is mildly asynchronous   contraction consistent with intraventricular conduction   delay (IVCD) or bundle Jeremiah Tarpley block. Doppler parameters   are consistent with abnormal left ventricular relaxation   (grade 1 diastolic dysfunction). - Aortic valve: Cusp separation was mildly reduced. There   was mild to moderate stenosis. - Left atrium: The atrium was moderately dilated.   07/2015 echo   Study Conclusions   - Left ventricle: The cavity size was normal. Wall thickness was   increased in a pattern of mild LVH. Systolic function was mildly   reduced. The estimated ejection fraction was in the range of 45%   to  50%. Diffuse hypokinesis. Doppler parameters are consistent   with restrictive physiology, indicative of decreased left   ventricular diastolic compliance and/or increased left atrial   pressure. Doppler parameters are consistent with high ventricular   filling pressure. - Aortic valve: Valve mobility was restricted. There was mild to   moderate stenosis. Valve area (VTI): 0.96 cm^2.  Valve area   (Vmax): 0.91 cm^2. Valve area (Vmean): 0.89 cm^2. - Mitral valve: Calcified annulus. There was mild regurgitation. - Left atrium: The atrium was moderately dilated. - Right atrium: The atrium was mildly dilated. - Atrial septum: There was a patent foramen ovale.   Impressions:   - Mild global reduction in LV function; restrictive filling with   elevated filling pressure; mild LVH; biatrial enlargement;   heavily calcified aortic valve with probable mild to moderate AS   (mean gradient 16 mmHg; calculated AVA .9 cm2 likely   overestimates severity; dimensionless index .3 not supportive of   severe AS); mild MR; patent foramen ovale.   07/2015 Cath Prox LAD to Mid LAD lesion, 50% stenosed.   1. Calcified moderate proximal-mid LAD stenosis 2. Widely patent, dominant LCx 3. Preserved cardiac output, mildly elevated right-sided intracardiac pressures   Recommend: Medical therapy for nonobstructive CAD and CHF   01/2016 AAA [place] Screen No aneurysm  08/2020 echo  IMPRESSIONS     1. Left ventricular ejection fraction, by estimation, is 60 to 65%. The  left ventricle has normal function. The left ventricle has no regional  wall motion abnormalities. There is moderate left ventricular hypertrophy.  Left ventricular diastolic  parameters are consistent with Grade I diastolic dysfunction (impaired  relaxation). Elevated left atrial pressure. The average left ventricular  global longitudinal strain is -17.6 %.   2. Right ventricular systolic function is normal. The right ventricular  size is  normal.   3. The mitral valve is abnormal. Mild mitral valve regurgitation. No  evidence of mitral stenosis.   4. The aortic valve has an indeterminant number of cusps. There is  moderate calcification of the aortic valve. There is moderate thickening  of the aortic valve. Aortic valve regurgitation is mild. Mild to moderate  aortic valve stenosis. Mild to moderate   aortic stenosis is present. Aortic valve mean gradient measures 17.0  mmHg. Aortic valve peak gradient measures 26.7 mmHg. Aortic valve area, by  VTI measures 1.12 cm.   Assessment and Plan   1. CAD - moderate nonobstructive disease by prior cath.  - recent chest pain symptoms, will plan for exercise nuclear stress to further evalaute - if negative cardiac workup may be secondary to his history of esophgaitis EKG today shows SR, chronic ST/T changes     2. Hyperlipidemia - at goal, continue atorvastatin   3. Aortic stenosis - mild to moderate by most recent echo  -we will continue to monitor   4. HTN - elevated here but at goal at recent GI appt, monitor at this time   5. Chronic diasotlic heart failure - he is euvolemic, continue diuretic      Arnoldo Lenis, M.D.

## 2021-02-11 NOTE — Patient Instructions (Addendum)
Medication Instructions:  Your physician recommends that you continue on your current medications as directed. Please refer to the Current Medication list given to you today.  Labwork: none  Testing/Procedures: Your physician has requested that you have en exercise stress myoview. For further information please visit [email or web address]. Please follow instruction sheet, as given.  Follow-Up: Your physician recommends that you schedule a follow-up appointment in: 6 months  Any Other Special Instructions Will Be Listed Below (If Applicable).  If you need a refill on your cardiac medications before your next appointment, please call your pharmacy.

## 2021-02-12 ENCOUNTER — Encounter (INDEPENDENT_AMBULATORY_CARE_PROVIDER_SITE_OTHER): Payer: Self-pay

## 2021-02-17 ENCOUNTER — Other Ambulatory Visit: Payer: Self-pay | Admitting: Urology

## 2021-02-17 DIAGNOSIS — R972 Elevated prostate specific antigen [PSA]: Secondary | ICD-10-CM

## 2021-02-17 MED ORDER — LEVOFLOXACIN 750 MG PO TABS: 750.0000 mg | ORAL_TABLET | Freq: Every day | ORAL | 0 refills | Status: AC

## 2021-02-18 ENCOUNTER — Telehealth: Payer: Self-pay

## 2021-02-18 DIAGNOSIS — R972 Elevated prostate specific antigen [PSA]: Secondary | ICD-10-CM

## 2021-02-18 NOTE — Telephone Encounter (Signed)
-----   Message from Franchot Gallo, MD sent at 02/17/2021  4:22 PM EST ----- Notify patient that PSA is now up to 4.9.  I suggest we proceed with ultrasound and biopsy of prostate.  I put those orders in.  Please schedule. ----- Message ----- From: Iris Pert, LPN Sent: 01/15/1593  12:04 PM EST To: Franchot Gallo, MD  Please review

## 2021-02-18 NOTE — Telephone Encounter (Signed)
Biopsy instructions went over with patient via phone and sent via mail.

## 2021-02-19 IMAGING — DX DG CHEST 2V
2 series · 2 of 2 positions shown · non-contrast
Comparison: Chest radiograph dated 08/07/2015.

CLINICAL DATA: 68-year-old male with chest pain. History of CHF.

EXAM:
CHEST - 2 VIEW

[chest pa]
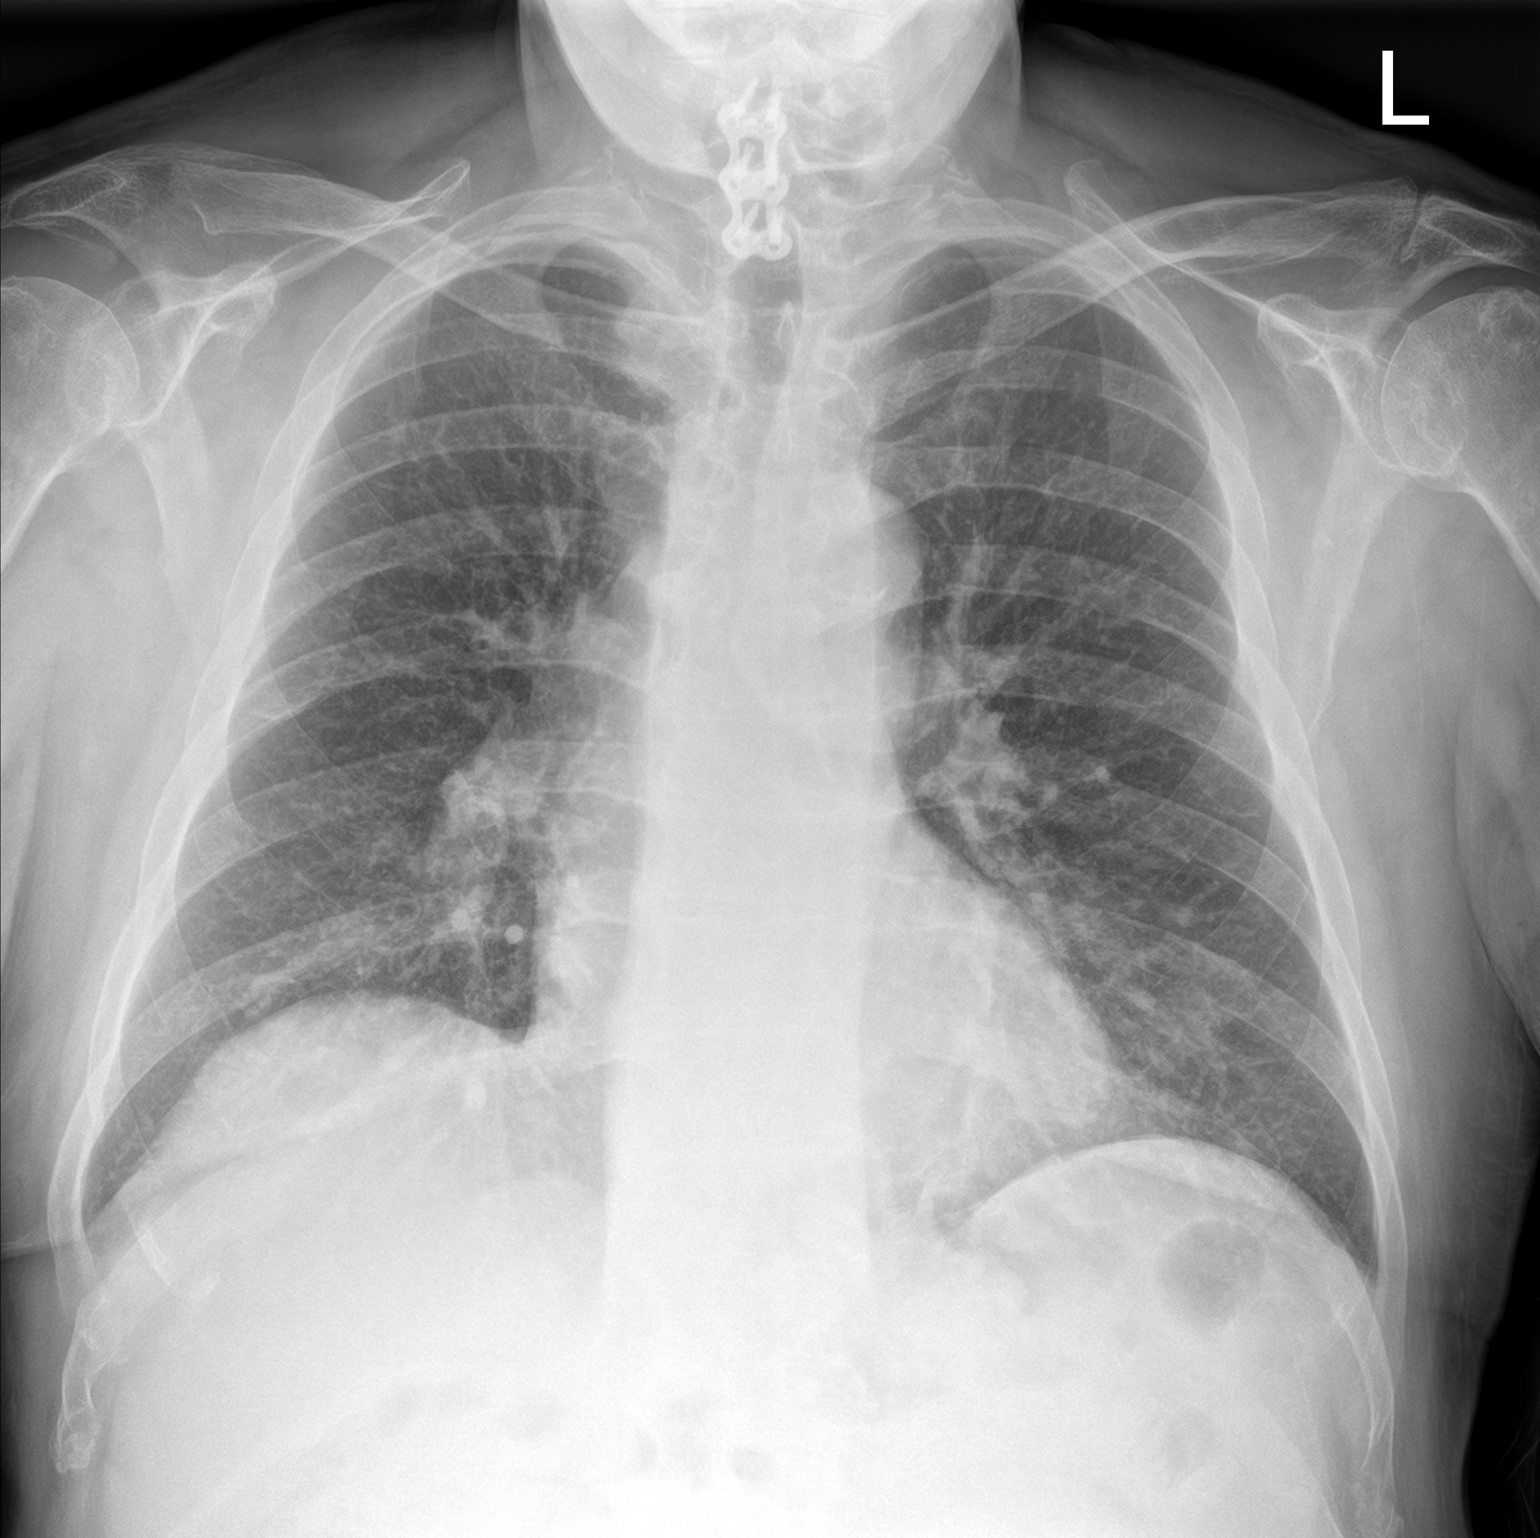

[chest lat]
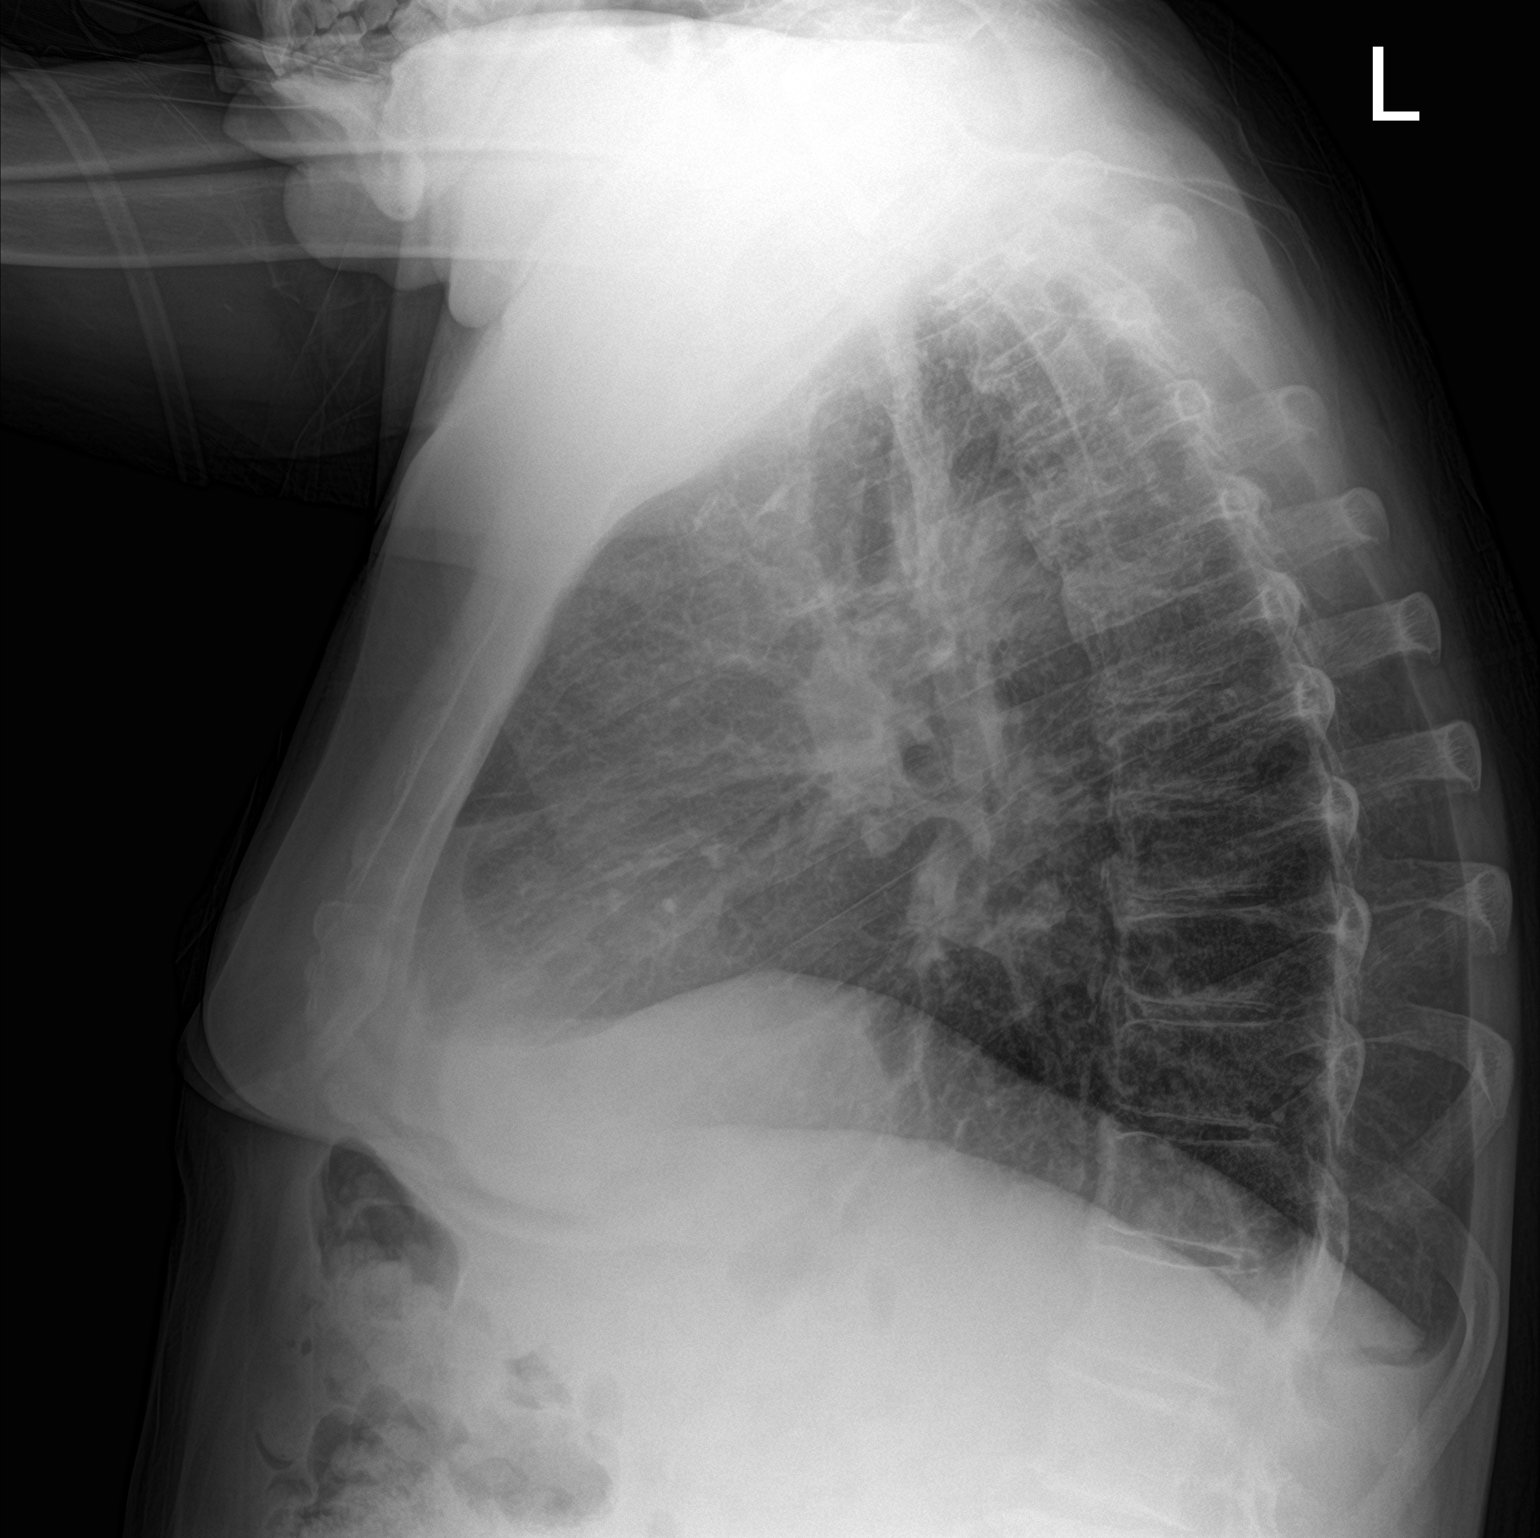

[2 of 2 positions shown; findings below may reference images not displayed]

FINDINGS: There is mild vascular congestion. No edema. No focal consolidation,
pleural effusion, pneumothorax. Stable cardiomediastinal silhouette.
Atherosclerotic calcification of the aorta. No acute osseous
pathology. Degenerative changes of the spine. Cervical ACDF.
IMPRESSION: Mild vascular congestion. No focal consolidation.

## 2021-02-19 NOTE — Telephone Encounter (Signed)
See other note

## 2021-02-23 ENCOUNTER — Ambulatory Visit (INDEPENDENT_AMBULATORY_CARE_PROVIDER_SITE_OTHER): Payer: Medicare HMO | Admitting: Family Medicine

## 2021-02-23 ENCOUNTER — Ambulatory Visit (HOSPITAL_COMMUNITY): Admission: RE | Admit: 2021-02-23 | Payer: Medicare HMO | Source: Ambulatory Visit

## 2021-02-23 ENCOUNTER — Other Ambulatory Visit: Payer: Self-pay

## 2021-02-23 ENCOUNTER — Encounter (HOSPITAL_COMMUNITY): Payer: Self-pay

## 2021-02-23 ENCOUNTER — Ambulatory Visit (HOSPITAL_COMMUNITY)
Admission: RE | Admit: 2021-02-23 | Discharge: 2021-02-23 | Disposition: A | Discharge status: Home / Self Care | Payer: Medicare HMO | Source: Ambulatory Visit | Attending: Cardiology | Admitting: Cardiology

## 2021-02-23 ENCOUNTER — Ambulatory Visit: Payer: Medicare HMO | Admitting: Family Medicine

## 2021-02-23 ENCOUNTER — Encounter: Payer: Self-pay | Admitting: Family Medicine

## 2021-02-23 VITALS — BP 136/64 | HR 84 | Temp 98.3°F | Resp 16 | Ht 66.0 in | Wt 180.8 lb

## 2021-02-23 DIAGNOSIS — E1159 Type 2 diabetes mellitus with other circulatory complications: Secondary | ICD-10-CM | POA: Diagnosis not present

## 2021-02-23 DIAGNOSIS — E782 Mixed hyperlipidemia: Secondary | ICD-10-CM

## 2021-02-23 DIAGNOSIS — I2510 Atherosclerotic heart disease of native coronary artery without angina pectoris: Secondary | ICD-10-CM | POA: Insufficient documentation

## 2021-02-23 DIAGNOSIS — R972 Elevated prostate specific antigen [PSA]: Secondary | ICD-10-CM

## 2021-02-23 DIAGNOSIS — I10 Essential (primary) hypertension: Secondary | ICD-10-CM

## 2021-02-23 DIAGNOSIS — E291 Testicular hypofunction: Secondary | ICD-10-CM

## 2021-02-23 DIAGNOSIS — Z8669 Personal history of other diseases of the nervous system and sense organs: Secondary | ICD-10-CM

## 2021-02-23 DIAGNOSIS — R079 Chest pain, unspecified: Secondary | ICD-10-CM | POA: Insufficient documentation

## 2021-02-23 DIAGNOSIS — M5116 Intervertebral disc disorders with radiculopathy, lumbar region: Secondary | ICD-10-CM

## 2021-02-23 MED ORDER — LISINOPRIL 20 MG PO TABS: 20.0000 mg | ORAL_TABLET | Freq: Every day | ORAL | 3 refills | Status: DC

## 2021-02-23 MED ORDER — GABAPENTIN 300 MG PO CAPS: 300.0000 mg | ORAL_CAPSULE | Freq: Two times a day (BID) | ORAL | 1 refills | Status: DC

## 2021-02-23 NOTE — Progress Notes (Signed)
Subjective:  Patient ID: Jonathan Gilmore, male    DOB: Oct 14, 1950  Age: 70 y.o. MRN: [id number]  CC:  Chief Complaint  Patient presents with   Nicotine Dependence    Had discussed cessation last visit pt reports smokes 1 PPD no changes otherwise    Hypertension    Pt denies physical sxs, doing well no concerns    Diabetes    Due for recheck, reports no questions, does need some refills    Hypogonadism    Due for recheck     HPI Jonathan Gilmore presents for   Hypogonadism Previously treated by other primary care provider, new patient to me in September.  Had been on testosterone for 1 to 2 years.  100 mg twice per week. He had been off medications for few weeks that his September 19 visit.  Restarted previous dosing and advised for 2-week follow-up for repeat level - now under care of urology. Elevated PSA of 4.93 weeks ago, Dr. Diona Fanti.  Free testosterone 2.9 on 01/29/2021.  PSA September 19 was 3.85. planned prostate biopsy - not sure of date.  Lab Results  Component Value Date   TESTOSTERONE 241.06 (L) 12/01/2020   Nicotine addiction Cessation discussed last visit, currently on 1 pack or less per day.  Does have a history of CAD, overall less smoking since last visit.  History of severe OSA but had loss of 100 pounds, recommended repeat testing using CPAP machine as of his last visit. Some nighttime wakening with snoring. No PND. Some marijuana to sleep at times.   Hypertension:  With CAD, hyperlipidemia, aortic stenosis, followed by cardiology.  Treated with lisinopril, Lasix, Toprol.  Has nitroglycerin if needed but no chest pains. Planned nuclear stress test today,  rescheduled as he took beta blocker - will be done next week. Home readings: BP Readings from Last 3 Encounters:  02/23/21 136/64  02/11/21 (!) 146/72  02/09/21 126/65   Lab Results  Component Value Date   CREATININE 0.72 12/01/2020    Diabetes: Complicated by vascular disease/CAD.  Treated with  metformin 1000 milligrams twice daily, he is on ACE inhibitor and statin.  On gabapentin for decreased feeling in the legs with history of disc issues as well. Taking gabapentin 300mg  - 2 at night. Same dose for awhile. Restless legs as well - helps to have meds at night and symptoms stable during day.   Microalbumin: today.   Lab Results  Component Value Date   HGBA1C 6.8 (H) 12/01/2020   HGBA1C 7.1 (H) 08/13/2020   HGBA1C 7.9 (H) 05/06/2020   Lab Results  Component Value Date   LDLCALC 38 12/01/2020   CREATININE 0.72 12/01/2020        History Patient Active Problem List   Diagnosis Date Noted   Esophagitis 02/09/2021   History of colonic polyps 02/09/2021   Intestinal metaplasia of antrum of stomach without dysplasia 02/09/2021   Insomnia 03/12/2019   Pure hypercholesterolemia 11/29/2018   Hypogonadism, male 11/23/2018   Essential hypertension, benign 11/13/2018   High risk medication use 10/28/2018   Carotid stenosis, asymptomatic, bilateral 10/28/2018   Tobacco use 10/28/2018   Polyp of colon 10/28/2018   Lumbar disc herniation with radiculopathy 12/21/2017   Spinal stenosis of lumbar region without neurogenic claudication 12/21/2017   CAD -50% LAD  08/09/2015   Moderate aortic stenosis 08/09/2015   Acute pulmonary edema ([place]) 78/24/2353   Acute diastolic CHF (congestive heart failure) ([place]) 08/07/2015   Hypersomnia with sleep apnea, unspecified  09/20/2013   Syncope 09/19/2013   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   OSA (obstructive sleep apnea) -non compliant 09/19/2013   Narcolepsy and cataplexy 09/19/2013   Facial abscess 04/28/2013   Facial cellulitis 04/28/2013   Mixed hyperlipidemia 04/28/2013   Tobacco dependence 04/28/2013   Sepsis ([place]) 04/28/2013   Gout 57/32/2025   Diastolic CHF ([place]) 42/70/6237   Pleuritic pain 02/24/2013   Chest pain 02/24/2013   Murmur 02/24/2013   Pericarditis 02/24/2013   DM type 2 causing vascular disease ([place])  02/24/2013   Essential hypertension 02/24/2013   Cervical spondylosis with myelopathy 01/19/2011   Past Medical History:  Diagnosis Date   Abnormal cardiovascular stress test 03/2013   Anxiety    Arthritis    Back pain    CHF (congestive heart failure) ([place])    Coronary artery disease    Multivessel with significant LAD involvement by chest CT 2014   Depression    Diastolic dysfunction    Essential hypertension    Lumbar herniated disc    L4-L5   MRSA (methicillin resistant staph aureus) culture positive    Shortness of breath dyspnea    Sleep apnea    No cpap   Type 2 diabetes mellitus ([place])    Past Surgical History:  Procedure Laterality Date   ANTERIOR CERVICAL DECOMP/DISCECTOMY FUSION  01/18/2011   Procedure: ANTERIOR CERVICAL DECOMPRESSION/DISCECTOMY FUSION 2 LEVELS;  Surgeon: Cooper Render Pool;  Location: [facility];  Service: Neurosurgery;  Laterality: N/A;  anterior cervical discectomy and fusion with allograft and plating, cervical five-six, cervical six-seven   BIOPSY N/A 04/04/2015   Procedure: BIOPSY;  Surgeon: Rogene Houston, MD;  [facility];  Service: Endoscopy;  Laterality: N/A;   BIOPSY  11/09/2019   Procedure: BIOPSY;  Surgeon: Harvel Quale, MD;  [facility];  Service: Gastroenterology;;   Bone spur removed  1997   Left shoulder   CARDIAC CATHETERIZATION N/A 08/08/2015   Procedure: Right/Left Heart Cath and Coronary Angiography;  Surgeon: Sherren Mocha, MD;  Location: [facility];  Service: Cardiovascular;  Laterality: N/A;   CARPAL TUNNEL RELEASE Right    COLONOSCOPY WITH PROPOFOL N/A 11/09/2019   Procedure: COLONOSCOPY WITH PROPOFOL;  Surgeon: Harvel Quale, MD;  [facility];  Service: Gastroenterology;  Laterality: N/A;  900   ESOPHAGEAL DILATION N/A 04/04/2015   Procedure: ESOPHAGEAL DILATION;  Surgeon: Rogene Houston, MD;  [facility];  Service: Endoscopy;  Laterality: N/A;    ESOPHAGEAL DILATION N/A 11/09/2019   Procedure: ESOPHAGEAL DILATION;  Surgeon: Harvel Quale, MD;  [facility];  Service: Gastroenterology;  Laterality: N/A;   ESOPHAGOGASTRODUODENOSCOPY N/A 04/04/2015   Procedure: ESOPHAGOGASTRODUODENOSCOPY (EGD);  Surgeon: Rogene Houston, MD;  [facility];  Service: Endoscopy;  Laterality: N/A;  1240   ESOPHAGOGASTRODUODENOSCOPY (EGD) WITH PROPOFOL N/A 11/09/2019   Procedure: ESOPHAGOGASTRODUODENOSCOPY (EGD) WITH PROPOFOL;  Surgeon: Harvel Quale, MD;  [facility];  Service: Gastroenterology;  Laterality: N/A;   HEMORRHOID SURGERY     POLYPECTOMY  11/09/2019   Procedure: POLYPECTOMY;  Surgeon: Harvel Quale, MD;  [facility];  Service: Gastroenterology;;   TONSILLECTOMY     Allergies  Allergen Reactions   Morphine And Related Nausea And Vomiting   Prior to Admission medications   Medication Sig Start Date End Date Taking? Authorizing Provider  ACCU-CHEK AVIVA PLUS test strip USE AS INSTRUCTED 06/13/19  Yes Corum, Rex Kras, MD  aspirin  EC 81 MG tablet Take 81 mg by mouth daily.   Yes [provider]  atorvastatin (LIPITOR) 80 MG tablet TAKE 1 TABLET BY MOUTH EVERY DAY 08/20/20  Yes Branch, Alphonse Guild, MD  Calcium-Magnesium-Zinc (CAL-MAG-ZINC PO) Take 1 tablet by mouth daily.   Yes [provider]  Flaxseed, Linseed, (FLAXSEED OIL) 1000 MG CAPS Take 1,000 mg by mouth daily.   Yes [provider]  furosemide (LASIX) 40 MG tablet Take 1 tablet (40 mg total) by mouth daily. 06/13/20  Yes Arnoldo Lenis, MD  gabapentin (NEURONTIN) 300 MG capsule TAKE 1 CAPSULE BY MOUTH TWICE A DAY 01/12/21  Yes Wendie Agreste, MD  lisinopril (ZESTRIL) 20 MG tablet Take 1 tablet (20 mg total) by mouth daily. 12/01/20 03/01/21 Yes Wendie Agreste, MD  magnesium oxide (MAG-OX) 400 MG tablet Take 400 mg by mouth at bedtime.   Yes [provider]  metFORMIN  (GLUCOPHAGE) 500 MG tablet TAKE 2 TABLETS TWICE A DAY 10/17/20  Yes Ailene Ards, NP  metoprolol succinate (TOPROL-XL) 25 MG 24 hr tablet TAKE 1 TABLET BY MOUTH EVERYDAY AT BEDTIME 06/13/20  Yes Branch, Alphonse Guild, MD  Multiple Vitamin (MULTIVITAMIN WITH MINERALS) TABS Take 1 tablet by mouth daily.   Yes [provider]  nitroGLYCERIN (NITROSTAT) 0.4 MG SL tablet Place 1 tablet (0.4 mg total) under the tongue every 5 (five) minutes x 3 doses as needed for chest pain (if no relief after 2nd dose, proceed to ED for an evaluation). 02/11/21  Yes BranchAlphonse Guild, MD  omeprazole (PRILOSEC) 40 MG capsule Take 40 mg by mouth daily.   Yes [provider]  polyethylene glycol-electrolytes (TRILYTE) 420 g solution Take 4,000 mLs by mouth as directed. 02/11/21  Yes Harvel Quale, MD  testosterone cypionate (DEPOTESTOSTERONE CYPIONATE) 200 MG/ML injection Inject 0.5 mLs (100 mg total) into the muscle 2 (two) times a week. 09/25/20  Yes Doree Albee, MD  sildenafil (VIAGRA) 100 MG tablet 1/2-1 p.o. as needed Patient not taking: Reported on 02/19/2021 01/27/21   Franchot Gallo, MD   Social History   Socioeconomic History   Marital status: Married    Spouse name: Not on file   Number of children: Not on file   Years of education: Not on file   Highest education level: Not on file  Occupational History   Not on file  Tobacco Use   Smoking status: Every Day    Packs/day: 1.00    Years: 50.00    Pack years: 50.00    Types: Cigarettes    Start date: 11/26/1967   Smokeless tobacco: Former   Tobacco comments:    smoking x 50 yrs  Vaping Use   Vaping Use: Never used  Substance and Sexual Activity   Alcohol use: Yes    Alcohol/week: 1.0 standard drink    Types: 1 Standard drinks or equivalent per week    Comment: rare   Drug use: Yes    Frequency: 1.0 times per week    Types: Marijuana    Comment: as needed   Sexual activity: Yes  Other Topics Concern   Not on  file  Social History Narrative   Lives with wife   Right Handed   Drinks 2-3 cups caffeine daily   Social Determinants of Health   Financial Resource Strain: Not on file  Food Insecurity: Not on file  Transportation Needs: Not on file  Physical Activity: Not on file  Stress: Not on file  Social Connections: Not on file  Intimate Partner Violence: Not on file    Review of Systems Per HPI   Objective:   Vitals:   02/23/21 1311  BP: 136/64  Pulse: 84  Resp: 16  Temp: 98.3 F (36.8 C)  TempSrc: Temporal  SpO2: 98%  Weight: 180 lb 12.8 oz (82 kg)  Height: 5\' 6"  (1.676 m)     Physical Exam Vitals reviewed.  Constitutional:      Appearance: He is well-developed.  HENT:     Head: Normocephalic and atraumatic.  Neck:     Vascular: No carotid bruit or JVD.  Cardiovascular:     Rate and Rhythm: Normal rate and regular rhythm.     Heart sounds: Normal heart sounds. No murmur heard. Pulmonary:     Effort: Pulmonary effort is normal.     Breath sounds: Normal breath sounds. No rales.  Musculoskeletal:     Right lower leg: No edema.     Left lower leg: No edema.  Skin:    General: Skin is warm and dry.  Neurological:     Mental Status: He is alert and oriented to person, place, and time.  Psychiatric:        Mood and Affect: Mood normal.       Assessment & Plan:  Jonathan Gilmore is a 71 y.o. male . Hypogonadism in male Elevated PSA  -Increasing PSA with planned biopsy, now under the care of urology including for his testosterone management.  Essential hypertension - Plan: lisinopril (ZESTRIL) 20 MG tablet  -Stable, refill lisinopril, no other med changes.  DM type 2 causing vascular disease ([place]) - Plan: Microalbumin / creatinine urine ratio  -Improved A1c last visit.  Plan for repeat testing in 3 months.  Check urine microalbumin.  Mixed hyperlipidemia  -Continue statin, continue follow-up with cardiology.  Lumbar disc herniation with radiculopathy -  Plan: gabapentin (NEURONTIN) 300 MG capsule  -Stable control of neuropathic symptoms as well as RLS with gabapentin, continue same.  History of sleep apnea - Plan: Ambulatory referral to Sleep Studies  -Reported weight loss but does have episodic wakening, concern for persistent sleep apnea, not on CPAP at this time.  Refer for updated evaluation/testing, especially given his heart disease.  RTC precautions discussed if persistent need for other treatments for insomnia.   Meds ordered this encounter  Medications   gabapentin (NEURONTIN) 300 MG capsule    Sig: Take 1 capsule (300 mg total) by mouth 2 (two) times daily.    Dispense:  180 capsule    Refill:  1   lisinopril (ZESTRIL) 20 MG tablet    Sig: Take 1 tablet (20 mg total) by mouth daily.    Dispense:  90 tablet    Refill:  3    Dose increased 08/05/2020   Patient Instructions  Keep up the good work on trying to quit smoking. Let me know if we can help.  Continue follow up with urologist about the prostate and testosterone.   I did refer you for updated sleep study. If you are having difficulty with sleep - please follow up to discuss further.   Recheck in 3 months for diabetes.       Signed,   Merri Ray, MD [place] 02/23/21 2:11 PM

## 2021-02-23 NOTE — Patient Instructions (Signed)
LASON EVELAND  02/23/2021     @PREFPERIOPPHARMACY @   Your procedure is scheduled on  02/27/2021.   Report to [facility] at  [facility]  AM.    Call this number if you have problems the morning of surgery:  [phone]   Remember:  Follow the diet and prep instructions given to you by the office.     DO NOT take any medications for diabetes the morning of your procedure.    Take these medicines the morning of surgery with A SIP OF WATER                                         gabapentin, prilosec.     Do not wear jewelry, make-up or nail polish.  Do not wear lotions, powders, or perfumes, or deodorant.  Do not shave 48 hours prior to surgery.  Men may shave face and neck.  Do not bring valuables to the hospital.  [facility] is not responsible for any belongings or valuables.  Contacts, dentures or bridgework may not be worn into surgery.  Leave your suitcase in the car.  After surgery it may be brought to your room.  For patients admitted to the hospital, discharge time will be determined by your treatment team.  Patients discharged the day of surgery will not be allowed to drive home and must have someone with them for 24 hours.    Special instructions:   DO NOT smoke tobacco or vape for 24 hours before your procedure.  Please read over the following fact sheets that you were given. Anesthesia Post-op Instructions and Care and Recovery After Surgery      Colonoscopy, Adult, Care After This sheet gives you information about how to care for yourself after your procedure. Your health care provider may also give you more specific instructions. If you have problems or questions, contact your health care provider. What can I expect after the procedure? After the procedure, it is common to have: A small amount of blood in your stool for 24 hours after the procedure. Some gas. Mild cramping or bloating of your abdomen. Follow these instructions at home: Eating and  drinking  Drink enough fluid to keep your urine pale yellow. Follow instructions from your health care provider about eating or drinking restrictions. Resume your normal diet as instructed by your health care provider. Avoid heavy or fried foods that are hard to digest. Activity Rest as told by your health care provider. Avoid sitting for a long time without moving. Get up to take short walks every 1-2 hours. This is important to improve blood flow and breathing. Ask for help if you feel weak or unsteady. Return to your normal activities as told by your health care provider. Ask your health care provider what activities are safe for you. Managing cramping and bloating  Try walking around when you have cramps or feel bloated. Apply heat to your abdomen as told by your health care provider. Use the heat source that your health care provider recommends, such as a moist heat pack or a heating pad. Place a towel between your skin and the heat source. Leave the heat on for 20-30 minutes. Remove the heat if your skin turns bright red. This is especially important if you are unable to feel pain, heat, or cold. You may have a greater risk  of getting burned. General instructions If you were given a sedative during the procedure, it can affect you for several hours. Do not drive or operate machinery until your health care provider says that it is safe. For the first 24 hours after the procedure: Do not sign important documents. Do not drink alcohol. Do your regular daily activities at a slower pace than normal. Eat soft foods that are easy to digest. Take over-the-counter and prescription medicines only as told by your health care provider. Keep all follow-up visits as told by your health care provider. This is important. Contact a health care provider if: You have blood in your stool 2-3 days after the procedure. Get help right away if you have: More than a small spotting of blood in your  stool. Large blood clots in your stool. Swelling of your abdomen. Nausea or vomiting. A fever. Increasing pain in your abdomen that is not relieved with medicine. Summary After the procedure, it is common to have a small amount of blood in your stool. You may also have mild cramping and bloating of your abdomen. If you were given a sedative during the procedure, it can affect you for several hours. Do not drive or operate machinery until your health care provider says that it is safe. Get help right away if you have a lot of blood in your stool, nausea or vomiting, a fever, or increased pain in your abdomen. This information is not intended to replace advice given to you by your health care provider. Make sure you discuss any questions you have with your health care provider. Document Revised: 01/05/2019 Document Reviewed: 09/25/2018 Elsevier Patient Education  [place] After This sheet gives you information about how to care for yourself after your procedure. Your health care provider may also give you more specific instructions. If you have problems or questions, contact your health care provider. What can I expect after the procedure? After the procedure, it is common to have: Tiredness. Forgetfulness about what happened after the procedure. Impaired judgment for important decisions. Nausea or vomiting. Some difficulty with balance. Follow these instructions at home: For the time period you were told by your health care provider:   Rest as needed. Do not participate in activities where you could fall or become injured. Do not drive or use machinery. Do not drink alcohol. Do not take sleeping pills or medicines that cause drowsiness. Do not make important decisions or sign legal documents. Do not take care of children on your own. Eating and drinking Follow the diet that is recommended by your health care provider. Drink enough fluid to  keep your urine pale yellow. If you vomit: Drink water, juice, or soup when you can drink without vomiting. Make sure you have little or no nausea before eating solid foods. General instructions Have a responsible adult stay with you for the time you are told. It is important to have someone help care for you until you are awake and alert. Take over-the-counter and prescription medicines only as told by your health care provider. If you have sleep apnea, surgery and certain medicines can increase your risk for breathing problems. Follow instructions from your health care provider about wearing your sleep device: Anytime you are sleeping, including during daytime naps. While taking prescription pain medicines, sleeping medicines, or medicines that make you drowsy. Avoid smoking. Keep all follow-up visits as told by your health care provider. This is important. Contact a health care provider  if: You keep feeling nauseous or you keep vomiting. You feel light-headed. You are still sleepy or having trouble with balance after 24 hours. You develop a rash. You have a fever. You have redness or swelling around the IV site. Get help right away if: You have trouble breathing. You have new-onset confusion at home. Summary For several hours after your procedure, you may feel tired. You may also be forgetful and have poor judgment. Have a responsible adult stay with you for the time you are told. It is important to have someone help care for you until you are awake and alert. Rest as told. Do not drive or operate machinery. Do not drink alcohol or take sleeping pills. Get help right away if you have trouble breathing, or if you suddenly become confused. This information is not intended to replace advice given to you by your health care provider. Make sure you discuss any questions you have with your health care provider. Document Revised: 11/15/2019 Document Reviewed: 02/01/2019 Elsevier Patient  Education  2022 [employer].

## 2021-02-23 NOTE — Patient Instructions (Addendum)
Keep up the good work on trying to quit smoking. Let me know if we can help.  Continue follow up with urologist about the prostate and testosterone.   I did refer you for updated sleep study. If you are having difficulty with sleep - please follow up to discuss further.   Recheck in 3 months for diabetes.

## 2021-02-25 ENCOUNTER — Encounter (HOSPITAL_COMMUNITY)
Admission: RE | Admit: 2021-02-25 | Discharge: 2021-02-25 | Disposition: A | Discharge status: Home / Self Care | Payer: Medicare HMO | Source: Ambulatory Visit | Attending: Gastroenterology | Admitting: Gastroenterology

## 2021-02-25 ENCOUNTER — Encounter (HOSPITAL_COMMUNITY): Payer: Self-pay

## 2021-02-25 ENCOUNTER — Other Ambulatory Visit: Payer: Self-pay

## 2021-02-25 DIAGNOSIS — Z01812 Encounter for preprocedural laboratory examination: Secondary | ICD-10-CM | POA: Insufficient documentation

## 2021-02-25 DIAGNOSIS — I10 Essential (primary) hypertension: Secondary | ICD-10-CM

## 2021-02-25 HISTORY — DX: Polyneuropathy, unspecified: G62.9

## 2021-02-25 LAB — BASIC METABOLIC PANEL
Anion gap: 9 (ref 5–15)
BUN: 19 mg/dL (ref 8–23)
CO2: 27 mmol/L (ref 22–32)
Calcium: 8.6 mg/dL — ABNORMAL LOW (ref 8.9–10.3)
Chloride: 100 mmol/L (ref 98–111)
Creatinine, Ser: 0.72 mg/dL (ref 0.61–1.24)
GFR, Estimated: >60 mL/min (ref >60)
Glucose, Bld: 161 mg/dL — ABNORMAL HIGH (ref 70–99)
Potassium: 4.1 mmol/L (ref 3.5–5.1)
Sodium: 136 mmol/L (ref 135–145)

## 2021-02-27 ENCOUNTER — Ambulatory Visit (HOSPITAL_COMMUNITY)
Admission: RE | Admit: 2021-02-27 | Discharge: 2021-02-27 | Disposition: A | Discharge status: Home / Self Care | Payer: Medicare HMO | Attending: Gastroenterology | Admitting: Gastroenterology

## 2021-02-27 ENCOUNTER — Ambulatory Visit (HOSPITAL_COMMUNITY): Payer: Medicare HMO | Admitting: Anesthesiology

## 2021-02-27 ENCOUNTER — Encounter (HOSPITAL_COMMUNITY)
Admission: RE | Disposition: A | Discharge status: Home / Self Care | Payer: Self-pay | Source: Home / Self Care | Attending: Gastroenterology

## 2021-02-27 ENCOUNTER — Encounter (HOSPITAL_COMMUNITY): Payer: Self-pay | Admitting: Gastroenterology

## 2021-02-27 DIAGNOSIS — D122 Benign neoplasm of ascending colon: Secondary | ICD-10-CM | POA: Insufficient documentation

## 2021-02-27 DIAGNOSIS — F17210 Nicotine dependence, cigarettes, uncomplicated: Secondary | ICD-10-CM | POA: Insufficient documentation

## 2021-02-27 DIAGNOSIS — Z9114 Patient's other noncompliance with medication regimen: Secondary | ICD-10-CM

## 2021-02-27 DIAGNOSIS — D123 Benign neoplasm of transverse colon: Secondary | ICD-10-CM | POA: Insufficient documentation

## 2021-02-27 DIAGNOSIS — I110 Hypertensive heart disease with heart failure: Secondary | ICD-10-CM | POA: Diagnosis not present

## 2021-02-27 DIAGNOSIS — Z79899 Other long term (current) drug therapy: Secondary | ICD-10-CM | POA: Insufficient documentation

## 2021-02-27 DIAGNOSIS — K5730 Diverticulosis of large intestine without perforation or abscess without bleeding: Secondary | ICD-10-CM

## 2021-02-27 DIAGNOSIS — Z09 Encounter for follow-up examination after completed treatment for conditions other than malignant neoplasm: Secondary | ICD-10-CM

## 2021-02-27 DIAGNOSIS — Z86010 Personal history of colonic polyps: Secondary | ICD-10-CM | POA: Diagnosis not present

## 2021-02-27 DIAGNOSIS — Z7984 Long term (current) use of oral hypoglycemic drugs: Secondary | ICD-10-CM | POA: Insufficient documentation

## 2021-02-27 DIAGNOSIS — E119 Type 2 diabetes mellitus without complications: Secondary | ICD-10-CM | POA: Diagnosis not present

## 2021-02-27 DIAGNOSIS — I2510 Atherosclerotic heart disease of native coronary artery without angina pectoris: Secondary | ICD-10-CM | POA: Insufficient documentation

## 2021-02-27 DIAGNOSIS — I10 Essential (primary) hypertension: Secondary | ICD-10-CM

## 2021-02-27 DIAGNOSIS — F1290 Cannabis use, unspecified, uncomplicated: Secondary | ICD-10-CM | POA: Diagnosis not present

## 2021-02-27 DIAGNOSIS — D120 Benign neoplasm of cecum: Secondary | ICD-10-CM | POA: Diagnosis not present

## 2021-02-27 DIAGNOSIS — A630 Anogenital (venereal) warts: Secondary | ICD-10-CM | POA: Insufficient documentation

## 2021-02-27 DIAGNOSIS — G4730 Sleep apnea, unspecified: Secondary | ICD-10-CM | POA: Diagnosis not present

## 2021-02-27 DIAGNOSIS — I509 Heart failure, unspecified: Secondary | ICD-10-CM | POA: Insufficient documentation

## 2021-02-27 HISTORY — PX: COLONOSCOPY WITH PROPOFOL: SHX5780

## 2021-02-27 HISTORY — PX: POLYPECTOMY: SHX5525

## 2021-02-27 LAB — HM COLONOSCOPY

## 2021-02-27 LAB — GLUCOSE, CAPILLARY: Glucose-Capillary: 131 mg/dL — ABNORMAL HIGH (ref 70–99)

## 2021-02-27 SURGERY — COLONOSCOPY WITH PROPOFOL
Anesthesia: General

## 2021-02-27 MED ORDER — PROPOFOL 10 MG/ML IV BOLUS
INTRAVENOUS | Status: DC | PRN
  Administered 2021-02-27: 50 mg via INTRAVENOUS
  Administered 2021-02-27: 100 mg via INTRAVENOUS

## 2021-02-27 MED ORDER — SODIUM CHLORIDE 0.9 % IV SOLN: INTRAVENOUS | Status: DC

## 2021-02-27 MED ORDER — PROPOFOL 10 MG/ML IV BOLUS
INTRAVENOUS | Status: AC
  Filled 2021-02-27: qty 40

## 2021-02-27 MED ORDER — LIDOCAINE HCL 1 % IJ SOLN
INTRAMUSCULAR | Status: DC | PRN
  Administered 2021-02-27: 50 mg via INTRADERMAL

## 2021-02-27 MED ORDER — PROPOFOL 1000 MG/100ML IV EMUL
INTRAVENOUS | Status: AC
  Filled 2021-02-27: qty 100

## 2021-02-27 MED ORDER — LIDOCAINE HCL (PF) 2 % IJ SOLN
INTRAMUSCULAR | Status: AC
  Filled 2021-02-27: qty 5

## 2021-02-27 MED ORDER — PROPOFOL 500 MG/50ML IV EMUL
INTRAVENOUS | Status: DC | PRN
  Administered 2021-02-27: 150 ug/kg/min via INTRAVENOUS

## 2021-02-27 MED ORDER — LACTATED RINGERS IV SOLN: INTRAVENOUS | Status: DC

## 2021-02-27 NOTE — Anesthesia Postprocedure Evaluation (Signed)
Anesthesia Post Note  Patient: Jonathan Gilmore  Procedure(s) Performed: COLONOSCOPY WITH PROPOFOL POLYPECTOMY  Anesthesia Type: General Level of consciousness: awake and alert Pain management: pain level controlled Vital Signs Assessment: post-procedure vital signs reviewed and stable Respiratory status: spontaneous breathing Cardiovascular status: blood pressure returned to baseline and stable Postop Assessment: no apparent nausea or vomiting Anesthetic complications: no   No notable events documented.   Last Vitals:  Vitals:   02/27/21 0641 02/27/21 0832  BP: (!) 146/56 94/63  Pulse: 66 83  Resp: 18 16  Temp: 36.9 C   SpO2: 99% 98%    Last Pain:  Vitals:   02/27/21 0832  TempSrc:   PainSc: 0-No pain                 [name]

## 2021-02-27 NOTE — Anesthesia Preprocedure Evaluation (Signed)
Anesthesia Evaluation  Patient identified by MRN, date of birth, ID band Patient awake    Reviewed: Allergy & Precautions, NPO status , Patient's Chart, lab work & pertinent test results  Airway Mallampati: II  TM Distance: >3 FB Neck ROM: Full   Comment: ACDF Dental  (+) Dental Advisory Given, Missing   Pulmonary shortness of breath, sleep apnea , Current SmokerPatient did not abstain from smoking.,    Pulmonary exam normal breath sounds clear to auscultation       Cardiovascular Exercise Tolerance: Good hypertension, Pt. on medications + CAD and +CHF  + Valvular Problems/Murmurs AS  Rhythm:Regular Rate:Normal + Systolic murmurs Aortic valve remains mild to moderately stiff, we will continue to monitor. -  Dr. Harl Bowie    Neuro/Psych PSYCHIATRIC DISORDERS Anxiety Depression TIA Neuromuscular disease    GI/Hepatic negative GI ROS, Neg liver ROS,   Endo/Other  diabetes, Well Controlled, Type 2, Oral Hypoglycemic Agents  Renal/GU negative Renal ROS  negative genitourinary   Musculoskeletal  (+) Arthritis , Osteoarthritis,    Abdominal   Peds negative pediatric ROS (+)  Hematology negative hematology ROS (+)   Anesthesia Other Findings ACDF  Reproductive/Obstetrics negative OB ROS                            Anesthesia Physical Anesthesia Plan  ASA: 3  Anesthesia Plan: General   Post-op Pain Management: Minimal or no pain anticipated   Induction:   PONV Risk Score and Plan: TIVA  Airway Management Planned: Nasal Cannula, Natural Airway and Simple Face Mask  Additional Equipment:   Intra-op Plan:   Post-operative Plan:   Informed Consent: I have reviewed the patients History and Physical, chart, labs and discussed the procedure including the risks, benefits and alternatives for the proposed anesthesia with the patient or authorized representative who has indicated his/her  understanding and acceptance.     Dental advisory given  Plan Discussed with: CRNA and Surgeon  Anesthesia Plan Comments:        Anesthesia Quick Evaluation

## 2021-02-27 NOTE — Op Note (Signed)
[facility] Patient Name: Jonathan Gilmore Procedure Date: 02/27/2021 6:46 AM MRN: [id number] Date of Birth: 04-Aug-1950 Attending MD: Maylon Peppers ,  CSN: [id number] Age: 70 Admit Type: Outpatient Procedure:                Colonoscopy Indications:              Surveillance: Personal history of colonic polyps                            with unknown histology on last colonoscopy                            (inadequate bowel preparation) less than 3 years ago Providers:                Maylon Peppers, Janeece Riggers, RN, Nelma Rothman,                            Technician Referring MD:              Medicines:                Monitored Anesthesia Care Complications:            No immediate complications. Estimated Blood Loss:     Estimated blood loss: none. Procedure:                Pre-Anesthesia Assessment:                           - Prior to the procedure, a History and Physical                            was performed, and patient medications, allergies                            and sensitivities were reviewed. The patient's                            tolerance of previous anesthesia was reviewed.                           - The risks and benefits of the procedure and the                            sedation options and risks were discussed with the                            patient. All questions were answered and informed                            consent was obtained.                           - ASA Grade Assessment: III - A patient with severe                            systemic disease.  After obtaining informed consent, the colonoscope                            was passed under direct vision. Throughout the                            procedure, the patient's blood pressure, pulse, and                            oxygen saturations were monitored continuously. The                            [id number]-HQ190L ([id number]) scope was introduced through                             the anus and advanced to the the cecum, identified                            by appendiceal orifice and ileocecal valve. The                            colonoscopy was performed with difficulty due to                            inadequate bowel prep. The patient tolerated the                            procedure well. The quality of the bowel                            preparation was inadequate. Scope In: 7:52:20 AM Scope Out: 8:27:45 AM Scope Withdrawal Time: 0 hours 13 minutes 1 second  Total Procedure Duration: 0 hours 35 minutes 25 seconds  Findings:      The perianal exam findings include anal condylomata.      Seven sessile polyps were found in the transverse colon, ascending colon       and cecum. The polyps were 4 to 10 mm in size. These polyps were removed       with a cold snare. Resection and retrieval were complete.      A moderate amount of semi-solid stool was found in the entire colon,       making visualization difficult.      Multiple small and large-mouthed diverticula were found in the entire       colon.      The retroflexed view of the distal rectum and anal verge was normal and       showed no anal or rectal abnormalities. Impression:               - Preparation of the colon was inadequate.                           - Anal condylomata found on perianal exam.                           - Seven 4 to 10  mm polyps in the transverse colon,                            in the ascending colon and in the cecum, removed                            with a cold snare. Resected and retrieved.                           - Stool in the entire examined colon.                           - Diverticulosis in the entire examined colon.                           - The distal rectum and anal verge are normal on                            retroflexion view. Moderate Sedation:      Per Anesthesia Care Recommendation:           - Discharge patient to home (ambulatory).                            - Resume previous diet.                           - Await pathology results.                           - Repeat colonoscopy in 1 year for surveillance -                            will need a FULL bowel prep 2 days before and a                            second split prep the day before his procedure. Procedure Code(s):        --- Professional ---                           [phone], Colonoscopy, flexible; with removal of                            tumor(s), polyp(s), or other lesion(s) by snare                            technique Diagnosis Code(s):        --- Professional ---                           Z86.010, Personal history of colonic polyps                           A63.0, Anogenital (venereal) warts  K63.5, Polyp of colon                           K57.30, Diverticulosis of large intestine without                            perforation or abscess without bleeding CPT copyright 2019 American Medical Association. All rights reserved. The codes documented in this report are preliminary and upon coder review may  be revised to meet current compliance requirements. Maylon Peppers, MD Maylon Peppers,  02/27/2021 8:34:44 AM This report has been signed electronically. Number of Addenda: 0

## 2021-02-27 NOTE — Transfer of Care (Signed)
Immediate Anesthesia Transfer of Care Note  Patient: Jonathan Gilmore  Procedure(s) Performed: COLONOSCOPY WITH PROPOFOL POLYPECTOMY  Patient Location: Short Stay  Anesthesia Type:General  Level of Consciousness: awake  Airway & Oxygen Therapy: Patient Spontanous Breathing  Post-op Assessment: Report given to RN  Post vital signs: Reviewed  Last Vitals:  Vitals Value Taken Time  BP    Temp    Pulse    Resp    SpO2      Last Pain:  Vitals:   02/27/21 0747  TempSrc:   PainSc: 0-No pain      Patients Stated Pain Goal: 6 (94/32/00 3794)  Complications: No notable events documented.

## 2021-02-27 NOTE — Discharge Instructions (Signed)
You are being discharged to home.  Resume your previous diet.  We are waiting for your pathology results.  Your physician has recommended a repeat colonoscopy in one year for surveillance.  

## 2021-02-27 NOTE — Interval H&P Note (Signed)
History and Physical Interval Note:  02/27/2021 7:41 AM  Jonathan Gilmore  has presented today for surgery, with the diagnosis of History of colon polyps.  The various methods of treatment have been discussed with the patient and family. After consideration of risks, benefits and other options for treatment, the patient has consented to  Procedure(s) with comments: COLONOSCOPY WITH PROPOFOL (N/A) - 7:30 as a surgical intervention.  The patient's history has been reviewed, patient examined, no change in status, stable for surgery.  I have reviewed the patient's chart and labs.  Questions were answered to the patient's satisfaction.     [name]

## 2021-03-02 ENCOUNTER — Ambulatory Visit (HOSPITAL_BASED_OUTPATIENT_CLINIC_OR_DEPARTMENT_OTHER)
Admission: RE | Admit: 2021-03-02 | Discharge: 2021-03-02 | Disposition: A | Discharge status: Home / Self Care | Payer: Medicare HMO | Source: Ambulatory Visit | Attending: Cardiology | Admitting: Cardiology

## 2021-03-02 ENCOUNTER — Other Ambulatory Visit: Payer: Self-pay

## 2021-03-02 ENCOUNTER — Ambulatory Visit (HOSPITAL_COMMUNITY)
Admission: RE | Admit: 2021-03-02 | Discharge: 2021-03-02 | Disposition: A | Discharge status: Home / Self Care | Payer: Medicare HMO | Source: Ambulatory Visit | Attending: Cardiology | Admitting: Cardiology

## 2021-03-02 DIAGNOSIS — R0602 Shortness of breath: Secondary | ICD-10-CM | POA: Insufficient documentation

## 2021-03-02 DIAGNOSIS — R079 Chest pain, unspecified: Secondary | ICD-10-CM | POA: Insufficient documentation

## 2021-03-02 DIAGNOSIS — I2510 Atherosclerotic heart disease of native coronary artery without angina pectoris: Secondary | ICD-10-CM | POA: Insufficient documentation

## 2021-03-02 LAB — NM MYOCAR MULTI W/SPECT W/WALL MOTION / EF
Angina Index: 1
LV dias vol: 124 mL (ref 62–150)
LV sys vol: 63 mL
Nuc Stress EF: 50 %
Peak HR: 110 {beats}/min
RATE: 0.4
Rest HR: 61 {beats}/min
Rest Nuclear Isotope Dose: 9.5 mCi
SDS: 1
SRS: 1
SSS: 2
Stress Nuclear Isotope Dose: 30.7 mCi
TID: 1.2

## 2021-03-02 LAB — SURGICAL PATHOLOGY

## 2021-03-02 MED ORDER — TECHNETIUM TC 99M TETROFOSMIN IV KIT
30.0000 | PACK | Freq: Once | INTRAVENOUS | Status: AC | PRN
  Administered 2021-03-02: 11:00:00 30.7 via INTRAVENOUS

## 2021-03-02 MED ORDER — TECHNETIUM TC 99M TETROFOSMIN IV KIT
10.0000 | PACK | Freq: Once | INTRAVENOUS | Status: AC | PRN
  Administered 2021-03-02: 10:00:00 9.54 via INTRAVENOUS

## 2021-03-02 MED ORDER — REGADENOSON 0.4 MG/5ML IV SOLN
INTRAVENOUS | Status: AC
  Administered 2021-03-02: 11:00:00 0.4 mg via INTRAVENOUS
  Filled 2021-03-02: qty 5

## 2021-03-02 MED ORDER — SODIUM CHLORIDE FLUSH 0.9 % IV SOLN
INTRAVENOUS | Status: AC
  Administered 2021-03-02: 11:00:00 10 mL via INTRAVENOUS
  Filled 2021-03-02: qty 10

## 2021-03-03 ENCOUNTER — Encounter (INDEPENDENT_AMBULATORY_CARE_PROVIDER_SITE_OTHER): Payer: Self-pay | Admitting: *Deleted

## 2021-03-04 ENCOUNTER — Encounter (HOSPITAL_COMMUNITY): Payer: Self-pay | Admitting: Gastroenterology

## 2021-03-05 ENCOUNTER — Telehealth: Payer: Self-pay | Admitting: *Deleted

## 2021-03-05 NOTE — Telephone Encounter (Signed)
-----   Message from Arnoldo Lenis, MD sent at 03/04/2021  4:51 PM EST ----- Stress test looks good, no evidence of any new blockages. He may consider touching base with his GI doctor to see if they think it may be GI related given his prior history  J BranchMD

## 2021-03-05 NOTE — Telephone Encounter (Signed)
Laurine Blazer, LPN  44/17/1278 71:83 AM EST Back to Top    Patient wife Lelon Frohlich) notified, copy to pcp.

## 2021-03-09 NOTE — Progress Notes (Incomplete)
Pt here today for TRUS/Bx d/t elevating PSA trend.  Risks, benefits, and some of the potential complications of a transrectal ultrasounds of the prostate (TRUSP) with biopsies were discussed at length with the patient including gross hematuria, blood in the bowel movements, hematospermia, bacteremia, infection, voiding discomfort, urinary retention, fever, chills, sepsis, blood transfusion, death, and others. All questions were answered. Informed consent was obtained. The patient confirmed that he had taken his pre-procedure antibiotic. All anticoagulants were discontinued prior to the procedure. The patient emptied his bladder. He was positioned in a comfortable left lateral decubitus position with hips and knees acutely flexed.  The rectal probe was inserted into the rectum without difficulty. 10cc of 2% Lidocaine without epinephrine was instilled with a spinal needle using ultrasound guidance near the junction of each seminal vesicle and the prostate.  Sequential transverse (axial) scans were made in small increments beginning at the seminal vesicles and ending at the prostatic apex. Sequential longitudinal (saggital) scans were made in small increments beginning at the right lateral prostate and ending at the left lateral prostate. Excellent anatomical imaging was obtained. The peripheral, transitional, and central zones were well-defined. The seminal vesicles were normal.  Prostate volume *** ml.  There were no hypoechoic areas. 12 needle core biopsies were performed. 1 biopsy each was taken from the following areas:  Right lateral base, right medial base, right lateral mid prostate, right medial mid prostate, right lateral apical prostate, right medial apical prostate, left lateral base, left medial base, left lateral mid prostate, left medial mid prostate, left lateral apical prostate, left medial apical prostate.. Minimal prostatic calcifications were noted. Excellent biopsy specimens were  obtained.  Follow-up rectal examination was unremarkable. The procedure was well-tolerated and without complications. Antibiotic instructions were given. The patient was told that:  For several days:  he should increase his fluid intake and limit strenuous activity  he might have mild discomfort at the base of his penis or in his rectum  he might have blood in his urine or blood in his bowel movements  For 2-3 months:  he might have blood in his ejaculate (semen)  Instructions were given to call the office immedicately for blood clots in the urine or bowel movements, difficulty urinating, inability to urinate, urinary retention, painful or frequent urination, fever, chills, nausea, vomiting, or other illness. The patient stated that he understood these instructions and would comply with them. We told the patient that prostate biopsy pathology reports are usually available within 3-5 working days, unless a pathologic second opinion is required, which may take 7-14 days. We told him to contact us to check on the status of his biopsy if he has not heard from [place] within 7 days. The patient left the ultrasound examination room in stable condition.

## 2021-03-10 ENCOUNTER — Ambulatory Visit (HOSPITAL_COMMUNITY): Admission: RE | Admit: 2021-03-10 | Payer: Medicare HMO | Source: Ambulatory Visit

## 2021-03-10 ENCOUNTER — Encounter (HOSPITAL_COMMUNITY): Payer: Self-pay

## 2021-03-10 ENCOUNTER — Other Ambulatory Visit: Payer: Medicare HMO | Admitting: Urology

## 2021-03-10 ENCOUNTER — Other Ambulatory Visit: Payer: Self-pay

## 2021-03-17 ENCOUNTER — Other Ambulatory Visit: Payer: Self-pay | Admitting: *Deleted

## 2021-03-17 ENCOUNTER — Other Ambulatory Visit (INDEPENDENT_AMBULATORY_CARE_PROVIDER_SITE_OTHER): Payer: Self-pay

## 2021-03-17 ENCOUNTER — Ambulatory Visit: Payer: Medicare HMO | Admitting: Urology

## 2021-03-17 DIAGNOSIS — K209 Esophagitis, unspecified without bleeding: Secondary | ICD-10-CM

## 2021-03-17 MED ORDER — FUROSEMIDE 40 MG PO TABS: 40.0000 mg | ORAL_TABLET | Freq: Every day | ORAL | 3 refills | Status: DC

## 2021-03-17 MED ORDER — ATORVASTATIN CALCIUM 80 MG PO TABS: 80.0000 mg | ORAL_TABLET | Freq: Every day | ORAL | 2 refills | Status: DC

## 2021-03-17 MED ORDER — OMEPRAZOLE 40 MG PO CPDR: 40.0000 mg | DELAYED_RELEASE_CAPSULE | Freq: Every day | ORAL | 3 refills | Status: DC

## 2021-03-17 MED ORDER — METOPROLOL SUCCINATE ER 25 MG PO TB24: ORAL_TABLET | ORAL | 3 refills | Status: DC

## 2021-04-07 ENCOUNTER — Telehealth: Payer: Self-pay

## 2021-04-07 DIAGNOSIS — R972 Elevated prostate specific antigen [PSA]: Secondary | ICD-10-CM

## 2021-04-07 NOTE — Telephone Encounter (Signed)
This patient wants to reschedule his biopsy.  Please advise.  Thanks, Helene Kelp

## 2021-04-09 NOTE — Telephone Encounter (Signed)
Patient called   No answer

## 2021-04-13 MED ORDER — LEVOFLOXACIN 750 MG PO TABS: 750.0000 mg | ORAL_TABLET | Freq: Once | ORAL | 0 refills | Status: AC

## 2021-04-13 NOTE — Telephone Encounter (Signed)
Returned call to patient and biopsy rescheduled for 02/21 per patient request. New antibiotic sent to pharmacy for patient.   Instructions mailed per patient request as well.

## 2021-04-23 ENCOUNTER — Telehealth: Payer: Self-pay

## 2021-04-23 NOTE — Telephone Encounter (Signed)
Patient called to have prostate biopsy rescheduled due to MD day unavailable-  Patient would like to keep scheduled biopsy date of 2/21.   Dr. Alyson Ingles agreed to complete prostate biopsy for patient, patient will follow up with Dr. Diona Fanti for his results. Patient understands Dr. Alyson Ingles will complete prostate biopsy.

## 2021-05-05 ENCOUNTER — Encounter (HOSPITAL_COMMUNITY): Payer: Self-pay

## 2021-05-05 ENCOUNTER — Encounter: Payer: Self-pay | Admitting: Urology

## 2021-05-05 ENCOUNTER — Encounter: Payer: Self-pay | Admitting: Family Medicine

## 2021-05-05 ENCOUNTER — Ambulatory Visit (HOSPITAL_BASED_OUTPATIENT_CLINIC_OR_DEPARTMENT_OTHER): Payer: Medicare Other | Admitting: Urology

## 2021-05-05 ENCOUNTER — Ambulatory Visit (HOSPITAL_COMMUNITY)
Admission: RE | Admit: 2021-05-05 | Discharge: 2021-05-05 | Disposition: A | Discharge status: Home / Self Care | Payer: Medicare Other | Source: Ambulatory Visit | Attending: Urology | Admitting: Urology

## 2021-05-05 ENCOUNTER — Other Ambulatory Visit: Payer: Self-pay

## 2021-05-05 DIAGNOSIS — R972 Elevated prostate specific antigen [PSA]: Secondary | ICD-10-CM | POA: Diagnosis present

## 2021-05-05 DIAGNOSIS — C61 Malignant neoplasm of prostate: Secondary | ICD-10-CM | POA: Diagnosis not present

## 2021-05-05 MED ORDER — LIDOCAINE HCL (PF) 2 % IJ SOLN: 10.0000 mL | Freq: Once | INTRAMUSCULAR | Status: AC

## 2021-05-05 MED ORDER — GENTAMICIN SULFATE 40 MG/ML IJ SOLN: 160.0000 mg | Freq: Once | INTRAMUSCULAR | Status: AC

## 2021-05-05 MED ORDER — LIDOCAINE HCL (PF) 2 % IJ SOLN
INTRAMUSCULAR | Status: AC
  Administered 2021-05-05: 10 mL
  Filled 2021-05-05: qty 10

## 2021-05-05 MED ORDER — GENTAMICIN SULFATE 40 MG/ML IJ SOLN
INTRAMUSCULAR | Status: AC
  Administered 2021-05-05: 160 mg via INTRAMUSCULAR
  Filled 2021-05-05: qty 4

## 2021-05-05 NOTE — Progress Notes (Signed)
PT tolerated prostate biopsy procedure and antibiotic injections well today. Labs obtained and sent for pathology. PT ambulatory at discharge with no acute distress noted and verbalized understanding of discharge instructions.  

## 2021-05-05 NOTE — Patient Instructions (Signed)
Transrectal Ultrasound-Guided Prostate Biopsy, Care After What can I expect after the procedure? After the procedure, it is common to have: Pain and discomfort near your butt (rectum), especially while sitting. Pink-colored pee (urine). This is due to small amounts of blood in your pee. A burning feeling while peeing. Blood in your poop (stool). Bleeding from your butt. Blood in your semen. Follow these instructions at home: Medicines Take over-the-counter and prescription medicines only as told by your doctor. If you were given a sedative during your procedure, do not drive or use machines until your doctor says that it is safe. A sedative is a medicine that helps you relax. If you were prescribed an antibiotic medicine, take it as told by your doctor. Do not stop taking it even if you start to feel better. Activity  Return to your normal activities when your doctor says that it is safe. Ask your doctor when it is okay for you to have sex. You may have to avoid lifting. Ask your doctor how much you can safely lift. General instructions  Drink enough water to keep your pee pale yellow. Watch your pee, poop, and semen for new bleeding or bleeding that gets worse. Keep all follow-up visits. Contact a doctor if: You have any of these: Blood clots in your pee or poop. Blood in your pee more than 2 weeks after the procedure. Blood in your semen more than 2 months after the procedure. New or worse bleeding in your pee, poop, or semen. Very bad belly pain. Your pee smells bad or unusual. You have trouble peeing. Your lower belly feels firm. You have problems getting an erection. You feel like you may vomit (are nauseous), or you vomit. Get help right away if: You have a fever or chills. You have bright red pee. You have very bad pain that does not get better with medicine. You cannot pee. Summary After this procedure, it is common to have pain and discomfort near your butt,  especially while sitting. You may have blood in your pee and poop. It is common to have blood in your semen. Get help right away if you have a fever or chills. This information is not intended to replace advice given to you by your health care provider. Make sure you discuss any questions you have with your health care provider. Document Revised: 08/25/2020 Document Reviewed: 08/25/2020 Elsevier Patient Education  [place].

## 2021-05-05 NOTE — Progress Notes (Signed)
Prostate Biopsy Procedure   Informed consent was obtained after discussing risks/benefits of the procedure.  A time out was performed to ensure correct patient identity.  Pre-Procedure: - Last PSA Level:  Lab Results  Component Value Date   PSA 3.85 12/01/2020   PSA 2.1 02/26/2019   - Gentamicin given prophylactically - Levaquin 500 mg administered PO -Transrectal Ultrasound performed revealing a 58.2 gm prostate -No significant hypoechoic or median lobe noted  Procedure: - Prostate block performed using 10 cc 1% lidocaine and biopsies taken from sextant areas, a total of 12 under ultrasound guidance.  Post-Procedure: - Patient tolerated the procedure well - He was counseled to seek immediate medical attention if experiences any severe pain, significant bleeding, or fevers - Return in one week to discuss biopsy results

## 2021-05-06 MED ORDER — METFORMIN HCL 500 MG PO TABS: 1000.0000 mg | ORAL_TABLET | Freq: Two times a day (BID) | ORAL | 1 refills | Status: DC

## 2021-05-09 ENCOUNTER — Other Ambulatory Visit: Payer: Self-pay | Admitting: Cardiology

## 2021-05-25 ENCOUNTER — Ambulatory Visit: Payer: Medicare Other | Admitting: Family Medicine

## 2021-05-25 ENCOUNTER — Encounter: Payer: Self-pay | Admitting: Family Medicine

## 2021-05-25 ENCOUNTER — Other Ambulatory Visit: Payer: Self-pay

## 2021-05-25 VITALS — BP 145/64 | HR 62 | Temp 97.8°F | Resp 18 | Ht 67.0 in | Wt 187.2 lb

## 2021-05-25 DIAGNOSIS — E1159 Type 2 diabetes mellitus with other circulatory complications: Secondary | ICD-10-CM

## 2021-05-25 LAB — POCT GLYCOSYLATED HEMOGLOBIN (HGB A1C): Hemoglobin A1C: 7.9 % — AB (ref 4.0–5.6)

## 2021-05-25 NOTE — Patient Instructions (Addendum)
OK to continue same meds for now if you are able to cut out sweets and watch diet.  Recheck in 6 weeks with fructosamine test and we can decide if other meds needed in addition to the metformin.    Diabetes Mellitus and Nutrition, Adult When you have diabetes, or diabetes mellitus, it is very important to have healthy eating habits because your blood sugar (glucose) levels are greatly affected by what you eat and drink. Eating healthy foods in the right amounts, at about the same times every day, can help you: Manage your blood glucose. Lower your risk of heart disease. Improve your blood pressure. Reach or maintain a healthy weight. What can affect my meal plan? Every person with diabetes is different, and each person has different needs for a meal plan. Your health care provider may recommend that you work with a dietitian to make a meal plan that is best for you. Your meal plan may vary depending on factors such as: The calories you need. The medicines you take. Your weight. Your blood glucose, blood pressure, and cholesterol levels. Your activity level. Other health conditions you have, such as heart or kidney disease. How do carbohydrates affect me? Carbohydrates, also called carbs, affect your blood glucose level more than any other type of food. Eating carbs raises the amount of glucose in your blood. It is important to know how many carbs you can safely have in each meal. This is different for every person. Your dietitian can help you calculate how many carbs you should have at each meal and for each snack. How does alcohol affect me? Alcohol can cause a decrease in blood glucose (hypoglycemia), especially if you use insulin or take certain diabetes medicines by mouth. Hypoglycemia can be a life-threatening condition. Symptoms of hypoglycemia, such as sleepiness, dizziness, and confusion, are similar to symptoms of having too much alcohol. Do not drink alcohol if: Your health care  provider tells you not to drink. You are pregnant, may be pregnant, or are planning to become pregnant. If you drink alcohol: Limit how much you have to: 0-1 drink a day for women. 0-2 drinks a day for men. Know how much alcohol is in your drink. In the [place] one drink equals one 12 oz bottle of beer (355 mL), one 5 oz glass of wine (148 mL), or one 1 oz glass of hard liquor (44 mL). Keep yourself hydrated with water, diet soda, or unsweetened iced tea. Keep in mind that regular soda, juice, and other mixers may contain a lot of sugar and must be counted as carbs. What are tips for following this plan? Reading food labels Start by checking the serving size on the Nutrition Facts label of packaged foods and drinks. The number of calories and the amount of carbs, fats, and other nutrients listed on the label are based on one serving of the item. Many items contain more than one serving per package. Check the total grams (g) of carbs in one serving. Check the number of grams of saturated fats and trans fats in one serving. Choose foods that have a low amount or none of these fats. Check the number of milligrams (mg) of salt (sodium) in one serving. Most people should limit total sodium intake to less than 2,300 mg per day. Always check the nutrition information of foods labeled as "low-fat" or "nonfat." These foods may be higher in added sugar or refined carbs and should be avoided. Talk to your dietitian to identify your  daily goals for nutrients listed on the label. Shopping Avoid buying canned, pre-made, or processed foods. These foods tend to be high in fat, sodium, and added sugar. Shop around the outside edge of the grocery store. This is where you will most often find fresh fruits and vegetables, bulk grains, fresh meats, and fresh dairy products. Cooking Use low-heat cooking methods, such as baking, instead of high-heat cooking methods, such as deep frying. Cook using healthy oils, such as  olive, canola, or sunflower oil. Avoid cooking with butter, cream, or high-fat meats. Meal planning Eat meals and snacks regularly, preferably at the same times every day. Avoid going long periods of time without eating. Eat foods that are high in fiber, such as fresh fruits, vegetables, beans, and whole grains. Eat 4-6 oz (112-168 g) of lean protein each day, such as lean meat, chicken, fish, eggs, or tofu. One ounce (oz) (28 g) of lean protein is equal to: 1 oz (28 g) of meat, chicken, or fish. 1 egg.  cup (62 g) of tofu. Eat some foods each day that contain healthy fats, such as avocado, nuts, seeds, and fish. What foods should I eat? Fruits Berries. Apples. Oranges. Peaches. Apricots. Plums. Grapes. Mangoes. Papayas. Pomegranates. Kiwi. Cherries. Vegetables Leafy greens, including lettuce, spinach, kale, chard, collard greens, mustard greens, and cabbage. Beets. Cauliflower. Broccoli. Carrots. Green beans. Tomatoes. Peppers. Onions. Cucumbers. Brussels sprouts. Grains Whole grains, such as whole-wheat or whole-grain bread, crackers, tortillas, cereal, and pasta. Unsweetened oatmeal. Quinoa. Brown or wild rice. Meats and other proteins Seafood. Poultry without skin. Lean cuts of poultry and beef. Tofu. Nuts. Seeds. Dairy Low-fat or fat-free dairy products such as milk, yogurt, and cheese. The items listed above may not be a complete list of foods and beverages you can eat and drink. Contact a dietitian for more information. What foods should I avoid? Fruits Fruits canned with syrup. Vegetables Canned vegetables. Frozen vegetables with butter or cream sauce. Grains Refined white flour and flour products such as bread, pasta, snack foods, and cereals. Avoid all processed foods. Meats and other proteins Fatty cuts of meat. Poultry with skin. Breaded or fried meats. Processed meat. Avoid saturated fats. Dairy Full-fat yogurt, cheese, or milk. Beverages Sweetened drinks, such as soda  or iced tea. The items listed above may not be a complete list of foods and beverages you should avoid. Contact a dietitian for more information. Questions to ask a health care provider Do I need to meet with a certified diabetes care and education specialist? Do I need to meet with a dietitian? What number can I call if I have questions? When are the best times to check my blood glucose? Where to find more information: American Diabetes Association: diabetes.org Academy of Nutrition and Dietetics: eatright.[employer] of Diabetes and Digestive and Kidney Diseases: [employer] Association of Diabetes Care & Education Specialists: diabeteseducator.org Summary It is important to have healthy eating habits because your blood sugar (glucose) levels are greatly affected by what you eat and drink. It is important to use alcohol carefully. A healthy meal plan will help you manage your blood glucose and lower your risk of heart disease. Your health care provider may recommend that you work with a dietitian to make a meal plan that is best for you. This information is not intended to replace advice given to you by your health care provider. Make sure you discuss any questions you have with your health care provider. Document Revised: 10/03/2019 Document Reviewed: 10/03/2019 Elsevier Patient  Education  2022 [employer].    If you have lab work done today you will be contacted with your lab results within the next 2 weeks.  If you have not heard from [place] then please contact us. The fastest way to get your results is to register for My Chart.   IF you received an x-ray today, you will receive an invoice from [facility]. Please contact [facility] at [phone] with questions or concerns regarding your invoice.   IF you received labwork today, you will receive an invoice from [place]. Please contact [facility] at [phone] with questions or concerns regarding your  invoice.   Our billing staff will not be able to assist you with questions regarding bills from these companies.  You will be contacted with the lab results as soon as they are available. The fastest way to get your results is to activate your My Chart account. Instructions are located on the last page of this paperwork. If you have not heard from [place] regarding the results in 2 weeks, please contact this office.

## 2021-05-25 NOTE — Progress Notes (Signed)
Subjective:  Patient ID: Jonathan Gilmore, male    DOB: 09-03-1950  Age: 71 y.o. MRN: [id number]  CC:  Chief Complaint  Patient presents with   Diabetes    Patient is here for a 3 month follow up on diabetes, and discuss lab results.    HPI Jonathan Gilmore presents for   Diabetes: Complicated by vascular disease, CAD.  Also followed by cardiology with history of aortic stenosis, CAD, hyperlipidemia.  Treated with metformin 1000 mg twice daily.  He is on ACE inhibitor and statin.  Gabapentin 600 mg nightly.  For decreased sensation of the legs but history of disc issues as well as restless legs.  A1c was improved at his September 2022 visit. Feels good. Rare missed dose - once per week or less. Some dietary indiscretion since last visit - some cakes, pie, ice cream.  Home readings: 120-140 fasting, no postprandials.  Symptomatic lows: none. Regular meals.   Microalbumin: Due, was not collected at last visit as planned. Optho, foot exam, pneumovax:  Immunization History  Administered Date(s) Administered   Fluad Quad(high Dose 65+) 11/29/2018   Influenza Split 01/19/2011   Influenza,inj,Quad PF,6+ Mos 02/25/2013   Moderna Sars-Covid-2 Vaccination 04/19/2019, 05/18/2019, 02/27/2020   Pneumococcal Conjugate-13 11/29/2018   Pneumococcal Polysaccharide-23 02/25/2013   Tdap 08/13/2020  Updated Pneumovax recommended.  Lab Results  Component Value Date   HGBA1C 7.9 (A) 05/25/2021   HGBA1C 6.8 (H) 12/01/2020   HGBA1C 7.1 (H) 08/13/2020   Lab Results  Component Value Date   LDLCALC 38 12/01/2020   CREATININE 0.72 02/25/2021   Wt Readings from Last 3 Encounters:  05/25/21 187 lb 3.2 oz (84.9 kg)  02/25/21 180 lb (81.6 kg)  02/23/21 180 lb 12.8 oz (82 kg)    Asking about prostate BX - has not yet heard results. Requests that I review them. Discussed biopsy results at his request - positive adenocarcinoma in 5% of one core. Plans to d/w urology - advised to call them if he has not  heard form them in next week.    History Patient Active Problem List   Diagnosis Date Noted   Esophagitis 02/09/2021   History of colonic polyps 02/09/2021   Intestinal metaplasia of antrum of stomach without dysplasia 02/09/2021   Insomnia 03/12/2019   Pure hypercholesterolemia 11/29/2018   Hypogonadism, male 11/23/2018   Essential hypertension, benign 11/13/2018   High risk medication use 10/28/2018   Carotid stenosis, asymptomatic, bilateral 10/28/2018   Tobacco use 10/28/2018   Polyp of colon 10/28/2018   Lumbar disc herniation with radiculopathy 12/21/2017   Spinal stenosis of lumbar region without neurogenic claudication 12/21/2017   CAD -50% LAD  08/09/2015   Moderate aortic stenosis 08/09/2015   Acute pulmonary edema ([place]) 67/61/9509   Acute diastolic CHF (congestive heart failure) ([place]) 08/07/2015   Hypersomnia with sleep apnea, unspecified 09/20/2013   Syncope 09/19/2013   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   OSA (obstructive sleep apnea) -non compliant 09/19/2013   Narcolepsy and cataplexy 09/19/2013   Facial abscess 04/28/2013   Facial cellulitis 04/28/2013   Mixed hyperlipidemia 04/28/2013   Tobacco dependence 04/28/2013   Sepsis ([place]) 04/28/2013   Gout 32/67/1245   Diastolic CHF ([place]) 80/99/8338   Pleuritic pain 02/24/2013   Chest pain 02/24/2013   Murmur 02/24/2013   Pericarditis 02/24/2013   DM type 2 causing vascular disease ([place]) 02/24/2013   Essential hypertension 02/24/2013   Cervical spondylosis with myelopathy 01/19/2011   Past Medical History:  Diagnosis Date  Abnormal cardiovascular stress test 03/2013   Anxiety    Arthritis    Back pain    CHF (congestive heart failure) ([place])    Coronary artery disease    Multivessel with significant LAD involvement by chest CT 2014   Depression    Diastolic dysfunction    Essential hypertension    Lumbar herniated disc    L4-L5   MRSA (methicillin resistant staph aureus) culture positive     Neuropathy    Shortness of breath dyspnea    Sleep apnea    No cpap   Type 2 diabetes mellitus [facility])    Past Surgical History:  Procedure Laterality Date   ANTERIOR CERVICAL DECOMP/DISCECTOMY FUSION  01/18/2011   Procedure: ANTERIOR CERVICAL DECOMPRESSION/DISCECTOMY FUSION 2 LEVELS;  Surgeon: Cooper Render Pool;  Location: [place] [facility];  Service: Neurosurgery;  Laterality: N/A;  anterior cervical discectomy and fusion with allograft and plating, cervical five-six, cervical six-seven   BIOPSY N/A 04/04/2015   Procedure: BIOPSY;  Surgeon: Rogene Houston, MD;  [facility];  Service: Endoscopy;  Laterality: N/A;   BIOPSY  11/09/2019   Procedure: BIOPSY;  Surgeon: Harvel Quale, MD;  [facility];  Service: Gastroenterology;;   Bone spur removed  1997   Left shoulder   CARDIAC CATHETERIZATION N/A 08/08/2015   Procedure: Right/Left Heart Cath and Coronary Angiography;  Surgeon: Sherren Mocha, MD;  Location: [place] [facility];  Service: Cardiovascular;  Laterality: N/A;   CARPAL TUNNEL RELEASE Right    COLONOSCOPY WITH PROPOFOL N/A 11/09/2019   Procedure: COLONOSCOPY WITH PROPOFOL;  Surgeon: Harvel Quale, MD;  [facility];  Service: Gastroenterology;  Laterality: N/A;  900   COLONOSCOPY WITH PROPOFOL N/A 02/27/2021   Procedure: COLONOSCOPY WITH PROPOFOL;  Surgeon: Harvel Quale, MD;  [facility];  Service: Gastroenterology;  Laterality: N/A;  7:30   ESOPHAGEAL DILATION N/A 04/04/2015   Procedure: ESOPHAGEAL DILATION;  Surgeon: Rogene Houston, MD;  [facility];  Service: Endoscopy;  Laterality: N/A;   ESOPHAGEAL DILATION N/A 11/09/2019   Procedure: ESOPHAGEAL DILATION;  Surgeon: Harvel Quale, MD;  [facility];  Service: Gastroenterology;  Laterality: N/A;   ESOPHAGOGASTRODUODENOSCOPY N/A 04/04/2015   Procedure: ESOPHAGOGASTRODUODENOSCOPY (EGD);  Surgeon: Rogene Houston, MD;  [facility];  Service: Endoscopy;  Laterality: N/A;  1240   ESOPHAGOGASTRODUODENOSCOPY (EGD) WITH PROPOFOL N/A 11/09/2019   Procedure: ESOPHAGOGASTRODUODENOSCOPY (EGD) WITH PROPOFOL;  Surgeon: Harvel Quale, MD;  [facility];  Service: Gastroenterology;  Laterality: N/A;   HEMORRHOID SURGERY     POLYPECTOMY  11/09/2019   Procedure: POLYPECTOMY;  Surgeon: Harvel Quale, MD;  [facility];  Service: Gastroenterology;;   POLYPECTOMY  02/27/2021   Procedure: POLYPECTOMY;  Surgeon: Harvel Quale, MD;  [facility];  Service: Gastroenterology;;   TONSILLECTOMY     Allergies  Allergen Reactions   Morphine And Related Nausea And Vomiting   Prior to Admission medications   Medication Sig Start Date End Date Taking? Authorizing Provider  ACCU-CHEK AVIVA PLUS test strip USE AS INSTRUCTED 06/13/19  Yes Corum, Rex Kras, MD  aspirin EC 81 MG tablet Take 81 mg by mouth daily.   Yes [provider]  atorvastatin (LIPITOR) 80 MG tablet TAKE 1 TABLET BY MOUTH EVERY DAY 05/11/21  Yes Branch, Alphonse Guild, MD  Calcium-Magnesium-Zinc (CAL-MAG-ZINC PO) Take 1 tablet by mouth daily.   Yes [provider]  Flaxseed, Linseed, (FLAXSEED OIL) 1000 MG CAPS Take 1,000 mg by mouth daily.   Yes [provider]  furosemide (LASIX) 40 MG tablet Take 1 tablet (40 mg total) by mouth daily. 03/17/21  Yes Branch, Alphonse Guild, MD  gabapentin (NEURONTIN) 300 MG capsule Take 1 capsule (300 mg total) by mouth 2 (two) times daily. 02/23/21  Yes Wendie Agreste, MD  magnesium oxide (MAG-OX) 400 MG tablet Take 400 mg by mouth at bedtime.   Yes [provider]  metFORMIN (GLUCOPHAGE) 500 MG tablet Take 2 tablets (1,000 mg total) by mouth 2 (two) times daily. 05/06/21  Yes Wendie Agreste, MD  metoprolol succinate (TOPROL-XL) 25 MG 24 hr tablet TAKE 1 TABLET BY MOUTH EVERYDAY AT BEDTIME 03/17/21  Yes Branch, Alphonse Guild, MD  Multiple  Vitamin (MULTIVITAMIN WITH MINERALS) TABS Take 1 tablet by mouth daily.   Yes [provider]  nitroGLYCERIN (NITROSTAT) 0.4 MG SL tablet Place 1 tablet (0.4 mg total) under the tongue every 5 (five) minutes x 3 doses as needed for chest pain (if no relief after 2nd dose, proceed to ED for an evaluation). 02/11/21  Yes BranchAlphonse Guild, MD  omeprazole (PRILOSEC) 40 MG capsule Take 1 capsule (40 mg total) by mouth daily. 03/17/21  Yes Harvel Quale, MD  sildenafil (VIAGRA) 100 MG tablet 1/2-1 p.o. as needed 01/27/21  Yes Dahlstedt, Annie Main, MD  testosterone cypionate (DEPOTESTOSTERONE CYPIONATE) 200 MG/ML injection Inject 0.5 mLs (100 mg total) into the muscle 2 (two) times a week. 09/25/20  Yes Gosrani, Nimish C, MD  lisinopril (ZESTRIL) 20 MG tablet Take 1 tablet (20 mg total) by mouth daily. 02/23/21 05/24/21  Wendie Agreste, MD   Social History   Socioeconomic History   Marital status: Married    Spouse name: Not on file   Number of children: Not on file   Years of education: Not on file   Highest education level: Not on file  Occupational History   Not on file  Tobacco Use   Smoking status: Every Day    Packs/day: 1.00    Years: 50.00    Pack years: 50.00    Types: Cigarettes    Start date: 11/26/1967   Smokeless tobacco: Former   Tobacco comments:    smoking x 50 yrs  Vaping Use   Vaping Use: Never used  Substance and Sexual Activity   Alcohol use: Yes    Alcohol/week: 1.0 standard drink    Types: 1 Standard drinks or equivalent per week    Comment: rare   Drug use: Yes    Frequency: 1.0 times per week    Types: Marijuana    Comment: as needed   Sexual activity: Yes  Other Topics Concern   Not on file  Social History Narrative   Lives with wife   Right Handed   Drinks 2-3 cups caffeine daily   Social Determinants of Health   Financial Resource Strain: Not on file  Food Insecurity: Not on file  Transportation Needs: Not on file  Physical  Activity: Not on file  Stress: Not on file  Social Connections: Not on file  Intimate Partner Violence: Not on file    Review of Systems  Constitutional:  Negative for fatigue and unexpected weight change.  Eyes:  Negative for visual disturbance.  Respiratory:  Negative for cough, chest tightness and shortness of breath.   Cardiovascular:  Negative for chest pain, palpitations and leg swelling.  Gastrointestinal:  Negative for abdominal  pain and blood in stool.  Neurological:  Negative for dizziness, light-headedness and headaches.    Objective:   Vitals:   05/25/21 1046 05/25/21 1049  BP: (!) 152/52 (!) 145/64  Pulse: 62   Resp: 18   Temp: 97.8 F (36.6 C)   TempSrc: Temporal   SpO2: 100%   Weight: 187 lb 3.2 oz (84.9 kg)   Height: '5\' 7"'$  (1.702 m)      Physical Exam Vitals reviewed.  Constitutional:      Appearance: He is well-developed.  HENT:     Head: Normocephalic and atraumatic.  Neck:     Vascular: No carotid bruit or JVD.  Cardiovascular:     Rate and Rhythm: Normal rate and regular rhythm.     Heart sounds: Normal heart sounds. No murmur heard. Pulmonary:     Effort: Pulmonary effort is normal.     Breath sounds: Normal breath sounds. No rales.  Musculoskeletal:     Right lower leg: No edema.     Left lower leg: No edema.  Skin:    General: Skin is warm and dry.  Neurological:     Mental Status: He is alert and oriented to person, place, and time.  Psychiatric:        Mood and Affect: Mood normal.    Results for orders placed or performed in visit on 05/25/21  POCT glycosylated hemoglobin (Hb A1C)  Result Value Ref Range   Hemoglobin A1C 7.9 (A) 4.0 - 5.6 %   HbA1c POC (<> result, manual entry)     HbA1c, POC (prediabetic range)     HbA1c, POC (controlled diabetic range)        Assessment & Plan:  Jonathan Gilmore is a 71 y.o. male . DM type 2 causing vascular disease ([place]) - Plan: POCT glycosylated hemoglobin (Hb A1C)  -Decreased control.   Option of additional meds.  However he plans to significantly change diet, cut back on sweets, carbs.  Handout given. Recheck 6 weeks with fructosamine and decide if additional meds needed at that time.  No orders of the defined types were placed in this encounter.  Patient Instructions  OK to continue same meds for now if you are able to cut out sweets and watch diet.  Recheck in 6 weeks with fructosamine test and we can decide if other meds needed in addition to the metformin.    Diabetes Mellitus and Nutrition, Adult When you have diabetes, or diabetes mellitus, it is very important to have healthy eating habits because your blood sugar (glucose) levels are greatly affected by what you eat and drink. Eating healthy foods in the right amounts, at about the same times every day, can help you: Manage your blood glucose. Lower your risk of heart disease. Improve your blood pressure. Reach or maintain a healthy weight. What can affect my meal plan? Every person with diabetes is different, and each person has different needs for a meal plan. Your health care provider may recommend that you work with a dietitian to make a meal plan that is best for you. Your meal plan may vary depending on factors such as: The calories you need. The medicines you take. Your weight. Your blood glucose, blood pressure, and cholesterol levels. Your activity level. Other health conditions you have, such as heart or kidney disease. How do carbohydrates affect me? Carbohydrates, also called carbs, affect your blood glucose level more than any other type of food. Eating carbs raises the amount of glucose  in your blood. It is important to know how many carbs you can safely have in each meal. This is different for every person. Your dietitian can help you calculate how many carbs you should have at each meal and for each snack. How does alcohol affect me? Alcohol can cause a decrease in blood glucose (hypoglycemia),  especially if you use insulin or take certain diabetes medicines by mouth. Hypoglycemia can be a life-threatening condition. Symptoms of hypoglycemia, such as sleepiness, dizziness, and confusion, are similar to symptoms of having too much alcohol. Do not drink alcohol if: Your health care provider tells you not to drink. You are pregnant, may be pregnant, or are planning to become pregnant. If you drink alcohol: Limit how much you have to: 0-1 drink a day for women. 0-2 drinks a day for men. Know how much alcohol is in your drink. In the [place] one drink equals one 12 oz bottle of beer (355 mL), one 5 oz glass of wine (148 mL), or one 1 oz glass of hard liquor (44 mL). Keep yourself hydrated with water, diet soda, or unsweetened iced tea. Keep in mind that regular soda, juice, and other mixers may contain a lot of sugar and must be counted as carbs. What are tips for following this plan? Reading food labels Start by checking the serving size on the Nutrition Facts label of packaged foods and drinks. The number of calories and the amount of carbs, fats, and other nutrients listed on the label are based on one serving of the item. Many items contain more than one serving per package. Check the total grams (g) of carbs in one serving. Check the number of grams of saturated fats and trans fats in one serving. Choose foods that have a low amount or none of these fats. Check the number of milligrams (mg) of salt (sodium) in one serving. Most people should limit total sodium intake to less than 2,300 mg per day. Always check the nutrition information of foods labeled as "low-fat" or "nonfat." These foods may be higher in added sugar or refined carbs and should be avoided. Talk to your dietitian to identify your daily goals for nutrients listed on the label. Shopping Avoid buying canned, pre-made, or processed foods. These foods tend to be high in fat, sodium, and added sugar. Shop around the outside  edge of the grocery store. This is where you will most often find fresh fruits and vegetables, bulk grains, fresh meats, and fresh dairy products. Cooking Use low-heat cooking methods, such as baking, instead of high-heat cooking methods, such as deep frying. Cook using healthy oils, such as olive, canola, or sunflower oil. Avoid cooking with butter, cream, or high-fat meats. Meal planning Eat meals and snacks regularly, preferably at the same times every day. Avoid going long periods of time without eating. Eat foods that are high in fiber, such as fresh fruits, vegetables, beans, and whole grains. Eat 4-6 oz (112-168 g) of lean protein each day, such as lean meat, chicken, fish, eggs, or tofu. One ounce (oz) (28 g) of lean protein is equal to: 1 oz (28 g) of meat, chicken, or fish. 1 egg.  cup (62 g) of tofu. Eat some foods each day that contain healthy fats, such as avocado, nuts, seeds, and fish. What foods should I eat? Fruits Berries. Apples. Oranges. Peaches. Apricots. Plums. Grapes. Mangoes. Papayas. Pomegranates. Kiwi. Cherries. Vegetables Leafy greens, including lettuce, spinach, kale, chard, collard greens, mustard greens, and cabbage. Beets. Cauliflower.  Broccoli. Carrots. Green beans. Tomatoes. Peppers. Onions. Cucumbers. Brussels sprouts. Grains Whole grains, such as whole-wheat or whole-grain bread, crackers, tortillas, cereal, and pasta. Unsweetened oatmeal. Quinoa. Brown or wild rice. Meats and other proteins Seafood. Poultry without skin. Lean cuts of poultry and beef. Tofu. Nuts. Seeds. Dairy Low-fat or fat-free dairy products such as milk, yogurt, and cheese. The items listed above may not be a complete list of foods and beverages you can eat and drink. Contact a dietitian for more information. What foods should I avoid? Fruits Fruits canned with syrup. Vegetables Canned vegetables. Frozen vegetables with butter or cream sauce. Grains Refined white flour and flour  products such as bread, pasta, snack foods, and cereals. Avoid all processed foods. Meats and other proteins Fatty cuts of meat. Poultry with skin. Breaded or fried meats. Processed meat. Avoid saturated fats. Dairy Full-fat yogurt, cheese, or milk. Beverages Sweetened drinks, such as soda or iced tea. The items listed above may not be a complete list of foods and beverages you should avoid. Contact a dietitian for more information. Questions to ask a health care provider Do I need to meet with a certified diabetes care and education specialist? Do I need to meet with a dietitian? What number can I call if I have questions? When are the best times to check my blood glucose? Where to find more information: American Diabetes Association: diabetes.org Academy of Nutrition and Dietetics: eatright.[employer] of Diabetes and Digestive and Kidney Diseases: [employer] Association of Diabetes Care & Education Specialists: diabeteseducator.org Summary It is important to have healthy eating habits because your blood sugar (glucose) levels are greatly affected by what you eat and drink. It is important to use alcohol carefully. A healthy meal plan will help you manage your blood glucose and lower your risk of heart disease. Your health care provider may recommend that you work with a dietitian to make a meal plan that is best for you. This information is not intended to replace advice given to you by your health care provider. Make sure you discuss any questions you have with your health care provider. Document Revised: 10/03/2019 Document Reviewed: 10/03/2019 Elsevier Patient Education  2022 [employer].    If you have lab work done today you will be contacted with your lab results within the next 2 weeks.  If you have not heard from [place] then please contact us. The fastest way to get your results is to register for My Chart.   IF you received an x-ray today, you will receive an  invoice from [facility]. Please contact [facility] at [phone] with questions or concerns regarding your invoice.   IF you received labwork today, you will receive an invoice from [facility]. Please contact [facility] at [phone] with questions or concerns regarding your invoice.   Our billing staff will not be able to assist you with questions regarding bills from these companies.  You will be contacted with the lab results as soon as they are available. The fastest way to get your results is to activate your My Chart account. Instructions are located on the last page of this paperwork. If you have not heard from [place] regarding the results in 2 weeks, please contact this office.        Signed,   Merri Ray, MD [place], [facility] 05/25/21 9:50 PM

## 2021-05-27 ENCOUNTER — Telehealth: Payer: Self-pay

## 2021-05-27 NOTE — Telephone Encounter (Signed)
Patient came by office today to ask about prostate biopsy results. ? ?Results forwarded to Dr. Diona Fanti.  ?

## 2021-06-07 ENCOUNTER — Encounter: Payer: Self-pay | Admitting: Urology

## 2021-06-07 ENCOUNTER — Encounter: Payer: Self-pay | Admitting: Family Medicine

## 2021-06-08 ENCOUNTER — Ambulatory Visit (INDEPENDENT_AMBULATORY_CARE_PROVIDER_SITE_OTHER): Payer: Medicare Other | Admitting: Urology

## 2021-06-08 ENCOUNTER — Encounter: Payer: Self-pay | Admitting: Urology

## 2021-06-08 ENCOUNTER — Other Ambulatory Visit: Payer: Self-pay

## 2021-06-08 VITALS — BP 121/63 | HR 86

## 2021-06-08 DIAGNOSIS — C61 Malignant neoplasm of prostate: Secondary | ICD-10-CM | POA: Diagnosis not present

## 2021-06-08 NOTE — Patient Instructions (Signed)
Prostate Cancer °The prostate is a small gland that helps make semen. It is located below a man's bladder, in front of the rectum. Prostate cancer is when abnormal cells grow in this gland. °What are the causes? °The cause of this condition is not known. °What increases the risk? °Being age 71 or older. °Having a family history of prostate cancer. °Having a family history of cancer of the breasts or ovaries. °Having genes that are passed from parent to child (inherited). °Having Lynch syndrome. °African American men and men of African descent are diagnosed with prostate cancer at higher rates than other men. °What are the signs or symptoms? °Problems peeing (urinating). This may include: °A stream that is weak, or pee that stops and starts. °Trouble starting or stopping your pee. °Trouble emptying all of your pee. °Needing to pee more often, especially at night. °Blood in your pee or semen. °Pain in the: °Lower back. °Lower belly (abdomen). °Hips. °Trouble getting an erection. °Weakness or numbness in the legs or feet. °How is this treated? °Treatment for this condition depends on: °How much the cancer has spread. °Your age. °The kind of treatment you want. °Your health. °Treatments include: °Being watched. This is called observation. You will be tested from time to time, but you will not get treated. Tests are to make sure that the cancer is not growing. °Surgery. This may be done to: °Take out (remove) the prostate. °Freeze and kill cancer cells. °Radiation. This uses a strong beam of energy to kill cancer cells. °Chemotherapy. This uses medicines that stop cancer cells from increasing. This kills cancer cells and healthy cells. °Targeted therapy. This kills cancer cells only. Healthy cells are not affected. °Hormone treatment. This stops the body from making hormones that help the cancer cells grow. °Follow these instructions at home: °Lifestyle °Do not smoke or use any products that contain nicotine or tobacco.  If you need help quitting, ask your doctor. °Eat a healthy diet. °Treatment may affect your ability to have sex. If you have a partner, touch, hold, hug, and caress your partner to have intimate moments. °Get plenty of sleep. °Ask your doctor for help to find a support group for men with prostate cancer. °General instructions °Take over-the-counter and prescription medicines only as told by your doctor. °If you have to go to the hospital, let your cancer doctor (oncologist) know. °Keep all follow-up visits. °Where to find more information °American Cancer Society: www.cancer.org °American Society of Clinical Oncology: www.cancer.net °National Cancer Institute: www.cancer.gov °Contact a doctor if: °You have new or more trouble peeing. °You have new or more blood in your pee. °You have new or more pain in your hips, back, or chest. °Get help right away if: °You have weakness in your legs. °You lose feeling in your legs. °You cannot control your pee or your poop (stool). °You have chills or a fever. °Summary °The prostate is a male gland that helps make semen. °Prostate cancer is when abnormal cells grow in this gland. °Treatment includes doing surgery, using medicines, using strong beams of energy, or watching without treatment. °Ask your doctor for help to find a support group for men with prostate cancer. °Contact a doctor if you have problems peeing or have any new pain that you did not have before. °This information is not intended to replace advice given to you by your health care provider. Make sure you discuss any questions you have with your health care provider. °Document Revised: 05/28/2020 Document Reviewed: 05/28/2020 °Elsevier   Patient Education © 2022 Elsevier Inc. ° °

## 2021-06-08 NOTE — Progress Notes (Signed)
? ?06/08/2021 ?3:41 PM  ? ?[place] ?06/23/1950 ?[id number] ? ?Referring provider: Wendie Agreste, MD ?[address] ?[place],  [place] [address] ? ?Followup prostate biopsy ? ? ?HPI: ?Jonathan Gilmore is a 71yo here for followup after prostate biopsy. Biopsy revealed Gleason 3+3=6 in 1/12 cores, 5% of core. PSA 4.9. He has mild LUTS. He has mild erectile dysfunction.  ? ? ?PMH: ?Past Medical History:  ?Diagnosis Date  ? Abnormal cardiovascular stress test 03/2013  ? Anxiety   ? Arthritis   ? Back pain   ? CHF (congestive heart failure) ([place])   ? Coronary artery disease   ? Multivessel with significant LAD involvement by chest CT 2014  ? Depression   ? Diastolic dysfunction   ? Essential hypertension   ? Lumbar herniated disc   ? L4-L5  ? MRSA (methicillin resistant staph aureus) culture positive   ? Neuropathy   ? Shortness of breath dyspnea   ? Sleep apnea   ? No cpap  ? Type 2 diabetes mellitus ([place])   ? ? ?Surgical History: ?Past Surgical History:  ?Procedure Laterality Date  ? ANTERIOR CERVICAL DECOMP/DISCECTOMY FUSION  01/18/2011  ? Procedure: ANTERIOR CERVICAL DECOMPRESSION/DISCECTOMY FUSION 2 LEVELS;  Surgeon: Charlie Pitter;  Location: [facility];  Service: Neurosurgery;  Laterality: N/A;  anterior cervical discectomy and fusion with allograft and plating, cervical five-six, cervical six-seven  ? BIOPSY N/A 04/04/2015  ? Procedure: BIOPSY;  Surgeon: Rogene Houston, MD;  [facility];  Service: Endoscopy;  Laterality: N/A;  ? BIOPSY  11/09/2019  ? Procedure: BIOPSY;  Surgeon: Montez Morita, Quillian Quince, MD;  [facility];  Service: Gastroenterology;;  ? Bone spur removed  1997  ? Left shoulder  ? CARDIAC CATHETERIZATION N/A 08/08/2015  ? Procedure: Right/Left Heart Cath and Coronary Angiography;  Surgeon: Sherren Mocha, MD;  Location: [facility];  Service: Cardiovascular;  Laterality: N/A;  ? CARPAL TUNNEL RELEASE Right   ? COLONOSCOPY WITH PROPOFOL N/A 11/09/2019  ? Procedure:  COLONOSCOPY WITH PROPOFOL;  Surgeon: Harvel Quale, MD;  [facility];  Service: Gastroenterology;  Laterality: N/A;  900  ? COLONOSCOPY WITH PROPOFOL N/A 02/27/2021  ? Procedure: COLONOSCOPY WITH PROPOFOL;  Surgeon: Harvel Quale, MD;  [facility];  Service: Gastroenterology;  Laterality: N/A;  7:30  ? ESOPHAGEAL DILATION N/A 04/04/2015  ? Procedure: ESOPHAGEAL DILATION;  Surgeon: Rogene Houston, MD;  [facility];  Service: Endoscopy;  Laterality: N/A;  ? ESOPHAGEAL DILATION N/A 11/09/2019  ? Procedure: ESOPHAGEAL DILATION;  Surgeon: Montez Morita, Quillian Quince, MD;  [facility];  Service: Gastroenterology;  Laterality: N/A;  ? ESOPHAGOGASTRODUODENOSCOPY N/A 04/04/2015  ? Procedure: ESOPHAGOGASTRODUODENOSCOPY (EGD);  Surgeon: Rogene Houston, MD;  [facility];  Service: Endoscopy;  Laterality: N/A;  1240  ? ESOPHAGOGASTRODUODENOSCOPY (EGD) WITH PROPOFOL N/A 11/09/2019  ? Procedure: ESOPHAGOGASTRODUODENOSCOPY (EGD) WITH PROPOFOL;  Surgeon: Harvel Quale, MD;  [facility];  Service: Gastroenterology;  Laterality: N/A;  ? HEMORRHOID SURGERY    ? POLYPECTOMY  11/09/2019  ? Procedure: POLYPECTOMY;  Surgeon: Harvel Quale, MD;  [facility];  Service: Gastroenterology;;  ? POLYPECTOMY  02/27/2021  ? Procedure: POLYPECTOMY;  Surgeon: Harvel Quale, MD;  [facility];  Service: Gastroenterology;;  ? TONSILLECTOMY    ? ? ?Home Medications:  ?Allergies as of 06/08/2021   ? ?   Reactions  ? Morphine  And Related Nausea And Vomiting  ? ?  ? ?  ?Medication List  ?  ? ?  ? Accurate as of June 08, 2021  3:41 PM. If you have any questions, ask your nurse or doctor.  ?  ?  ? ?  ? ?Accu-Chek Aviva Plus test strip ?Generic drug: glucose blood ?USE AS INSTRUCTED ?  ?aspirin EC 81 MG tablet ?Take 81 mg by mouth daily. ?  ?atorvastatin 80 MG tablet ?Commonly known as: LIPITOR ?TAKE 1 TABLET BY  MOUTH EVERY DAY ?  ?CAL-MAG-ZINC PO ?Take 1 tablet by mouth daily. ?  ?Flaxseed Oil 1000 MG Caps ?Take 1,000 mg by mouth daily. ?  ?furosemide 40 MG tablet ?Commonly known as: LASIX ?Take 1 tablet (40 mg total) by mouth daily. ?  ?gabapentin 300 MG capsule ?Commonly known as: NEURONTIN ?Take 1 capsule (300 mg total) by mouth 2 (two) times daily. ?  ?lisinopril 20 MG tablet ?Commonly known as: ZESTRIL ?Take 1 tablet (20 mg total) by mouth daily. ?  ?magnesium oxide 400 MG tablet ?Commonly known as: MAG-OX ?Take 400 mg by mouth at bedtime. ?  ?metFORMIN 500 MG tablet ?Commonly known as: GLUCOPHAGE ?Take 2 tablets (1,000 mg total) by mouth 2 (two) times daily. ?  ?metoprolol succinate 25 MG 24 hr tablet ?Commonly known as: TOPROL-XL ?TAKE 1 TABLET BY MOUTH EVERYDAY AT BEDTIME ?  ?multivitamin with minerals Tabs tablet ?Take 1 tablet by mouth daily. ?  ?nitroGLYCERIN 0.4 MG SL tablet ?Commonly known as: NITROSTAT ?Place 1 tablet (0.4 mg total) under the tongue every 5 (five) minutes x 3 doses as needed for chest pain (if no relief after 2nd dose, proceed to ED for an evaluation). ?  ?omeprazole 40 MG capsule ?Commonly known as: PRILOSEC ?Take 1 capsule (40 mg total) by mouth daily. ?  ?sildenafil 100 MG tablet ?Commonly known as: VIAGRA ?1/2-1 p.o. as needed ?  ?testosterone cypionate 200 MG/ML injection ?Commonly known as: DEPOTESTOSTERONE CYPIONATE ?Inject 0.5 mLs (100 mg total) into the muscle 2 (two) times a week. ?  ? ?  ? ? ?Allergies:  ?Allergies  ?Allergen Reactions  ? Morphine And Related Nausea And Vomiting  ? ? ?Family History: ?Family History  ?Problem Relation Age of Onset  ? Lung cancer Mother   ? Diabetes Father   ? Hepatitis B Father   ? Diabetes Sister   ? Cushing syndrome Sister   ? ? ?Social History:  reports that he has been smoking cigarettes. He started smoking about 53 years ago. He has a 50.00 pack-year smoking history. He has quit using smokeless tobacco. He reports current alcohol use of about  1.0 standard drink per week. He reports current drug use. Frequency: 1.00 time per week. Drug: Marijuana. ? ?ROS: ?All other review of systems were reviewed and are negative except what is noted above in HPI ? ?Physical Exam: ?BP 121/63   Pulse 86   ?Constitutional:  Alert and oriented, No acute distress. ?HEENT: [place] AT, moist mucus membranes.  Trachea midline, no masses. ?Cardiovascular: No clubbing, cyanosis, or edema. ?Respiratory: Normal respiratory effort, no increased work of breathing. ?GI: Abdomen is soft, nontender, nondistended, no abdominal masses ?GU: No CVA tenderness.  ?Lymph: No cervical or inguinal lymphadenopathy. ?Skin: No rashes, bruises or suspicious lesions. ?Neurologic: Grossly intact, no focal deficits, moving all 4 extremities. ?Psychiatric: Normal mood and affect. ? ?Laboratory Data: ?Lab Results  ?Component Value Date  ? WBC 8.6 12/01/2020  ? HGB 14.7 12/01/2020  ? HCT  44.1 12/01/2020  ? MCV 90.4 12/01/2020  ? PLT 206.0 12/01/2020  ? ? ?Lab Results  ?Component Value Date  ? CREATININE 0.72 02/25/2021  ? ? ?Lab Results  ?Component Value Date  ? PSA 3.85 12/01/2020  ? PSA 2.1 02/26/2019  ? ? ?Lab Results  ?Component Value Date  ? TESTOSTERONE 241.06 (L) 12/01/2020  ? ? ?Lab Results  ?Component Value Date  ? HGBA1C 7.9 (A) 05/25/2021  ? ? ?Urinalysis ?   ?Component Value Date/Time  ? COLORURINE STRAW (A) 01/16/2018 6387  ? APPEARANCEUR CLEAR 01/16/2018 0938  ? LABSPEC 1.008 01/16/2018 0938  ? PHURINE 7.0 01/16/2018 0938  ? GLUCOSEU NEGATIVE 01/16/2018 0938  ? [place] NEGATIVE 01/16/2018 0938  ? [place] NEGATIVE 01/16/2018 0938  ? [place] NEGATIVE 01/16/2018 0938  ? [place] NEGATIVE 01/16/2018 0938  ? UROBILINOGEN 0.2 08/23/2011 0807  ? NITRITE NEGATIVE 01/16/2018 0938  ? LEUKOCYTESUR NEGATIVE 01/16/2018 0938  ? ? ?No results found for: LABMICR, [place], RBCUA, LABEPIT, MUCUS, BACTERIA ? ?Pertinent Imaging: ? ?No results found for this or any previous visit. ? ?No results found for this or  any previous visit. ? ?No results found for this or any previous visit. ? ?No results found for this or any previous visit. ? ?No results found for this or any previous visit. ? ?No results found for this

## 2021-06-19 ENCOUNTER — Other Ambulatory Visit: Payer: Self-pay | Admitting: Cardiology

## 2021-07-24 ENCOUNTER — Other Ambulatory Visit: Payer: Self-pay | Admitting: Family Medicine

## 2021-07-24 DIAGNOSIS — M5116 Intervertebral disc disorders with radiculopathy, lumbar region: Secondary | ICD-10-CM

## 2021-07-27 NOTE — Telephone Encounter (Signed)
Patient is requesting a refill of the following medications: ?Requested Prescriptions  ? ?Pending Prescriptions Disp Refills  ? gabapentin (NEURONTIN) 300 MG capsule [Pharmacy Med Name: Gabapentin 300 MG Oral Capsule] 180 capsule 3  ?  Sig: TAKE 1 CAPSULE TWICE A DAY  ? ? ?Date of patient request: 07/24/21 ?Last office visit: 05/25/21 ?Date of last refill: 02/23/21 ?Last refill amount: 180 x1R ? ? ?

## 2021-07-27 NOTE — Telephone Encounter (Signed)
Discussed at March visit, refill ordered. ?

## 2021-08-14 ENCOUNTER — Encounter: Payer: Self-pay | Admitting: Emergency Medicine

## 2021-08-14 ENCOUNTER — Ambulatory Visit
Admission: EM | Admit: 2021-08-14 | Discharge: 2021-08-14 | Disposition: A | Discharge status: Home / Self Care | Payer: Medicare Other | Attending: Family Medicine | Admitting: Family Medicine

## 2021-08-14 DIAGNOSIS — L97521 Non-pressure chronic ulcer of other part of left foot limited to breakdown of skin: Secondary | ICD-10-CM

## 2021-08-14 MED ORDER — CHLORHEXIDINE GLUCONATE 4 % EX LIQD: Freq: Every day | CUTANEOUS | 0 refills | Status: DC | PRN

## 2021-08-14 MED ORDER — MUPIROCIN 2 % EX OINT: 1.0000 "application " | TOPICAL_OINTMENT | Freq: Two times a day (BID) | CUTANEOUS | 0 refills | Status: DC

## 2021-08-14 MED ORDER — SULFAMETHOXAZOLE-TRIMETHOPRIM 800-160 MG PO TABS: 1.0000 | ORAL_TABLET | Freq: Two times a day (BID) | ORAL | 0 refills | Status: AC

## 2021-08-14 NOTE — ED Provider Notes (Signed)
[facility]    CSN: [id number] Arrival date & time: 08/14/21  3546      History   Chief Complaint No chief complaint on file.   HPI Jonathan Gilmore is a 71 y.o. male.   Presenting today with 1 week history of a wound that started as a blister on the top of his left foot.  It is now worsened and he has redness and swelling surrounding the area and the wound is becoming deeper.  Some mild drainage at times.  History of diabetes and peripheral vascular disease, states he has chronic neuropathy with minimal sensation in the foot.  He denies fever, chills, decreased range of motion.  Has been using Silvadene cream and bandaging.  Has appointment with primary care provider next week.   Past Medical History:  Diagnosis Date   Abnormal cardiovascular stress test 03/2013   Anxiety    Arthritis    Back pain    CHF (congestive heart failure) ([facility])    Coronary artery disease    Multivessel with significant LAD involvement by chest CT 2014   Depression    Diastolic dysfunction    Essential hypertension    Lumbar herniated disc    L4-L5   MRSA (methicillin resistant staph aureus) culture positive    Neuropathy    Shortness of breath dyspnea    Sleep apnea    No cpap   Type 2 diabetes mellitus ([place])     Patient Active Problem List   Diagnosis Date Noted   Prostate cancer ([place]) 06/08/2021   Esophagitis 02/09/2021   History of colonic polyps 02/09/2021   Intestinal metaplasia of antrum of stomach without dysplasia 02/09/2021   Insomnia 03/12/2019   Pure hypercholesterolemia 11/29/2018   Hypogonadism, male 11/23/2018   Essential hypertension, benign 11/13/2018   High risk medication use 10/28/2018   Carotid stenosis, asymptomatic, bilateral 10/28/2018   Tobacco use 10/28/2018   Polyp of colon 10/28/2018   Lumbar disc herniation with radiculopathy 12/21/2017   Spinal stenosis of lumbar region without neurogenic claudication 12/21/2017   CAD -50% LAD  08/09/2015    Moderate aortic stenosis 08/09/2015   Acute pulmonary edema ([place]) 56/81/2751   Acute diastolic CHF (congestive heart failure) ([place]) 08/07/2015   Hypersomnia with sleep apnea, unspecified 09/20/2013   Syncope 09/19/2013   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   OSA (obstructive sleep apnea) -non compliant 09/19/2013   Narcolepsy and cataplexy 09/19/2013   Facial abscess 04/28/2013   Facial cellulitis 04/28/2013   Mixed hyperlipidemia 04/28/2013   Tobacco dependence 04/28/2013   Sepsis ([place]) 04/28/2013   Gout 70/03/7492   Diastolic CHF ([place]) 49/67/5916   Pleuritic pain 02/24/2013   Chest pain 02/24/2013   Murmur 02/24/2013   Pericarditis 02/24/2013   DM type 2 causing vascular disease ([place]) 02/24/2013   Essential hypertension 02/24/2013   Cervical spondylosis with myelopathy 01/19/2011    Past Surgical History:  Procedure Laterality Date   ANTERIOR CERVICAL DECOMP/DISCECTOMY FUSION  01/18/2011   Procedure: ANTERIOR CERVICAL DECOMPRESSION/DISCECTOMY FUSION 2 LEVELS;  Surgeon: Cooper Render Pool;  Location: [facility];  Service: Neurosurgery;  Laterality: N/A;  anterior cervical discectomy and fusion with allograft and plating, cervical five-six, cervical six-seven   BIOPSY N/A 04/04/2015   Procedure: BIOPSY;  Surgeon: Rogene Houston, MD;  [facility];  Service: Endoscopy;  Laterality: N/A;   BIOPSY  11/09/2019   Procedure: BIOPSY;  Surgeon: Harvel Quale, MD;  [facility];  Service: Gastroenterology;;  Bone spur removed  1997   Left shoulder   CARDIAC CATHETERIZATION N/A 08/08/2015   Procedure: Right/Left Heart Cath and Coronary Angiography;  Surgeon: Sherren Mocha, MD;  Location: [facility];  Service: Cardiovascular;  Laterality: N/A;   CARPAL TUNNEL RELEASE Right    COLONOSCOPY WITH PROPOFOL N/A 11/09/2019   Procedure: COLONOSCOPY WITH PROPOFOL;  Surgeon: Harvel Quale, MD;  [facility];  Service: Gastroenterology;   Laterality: N/A;  900   COLONOSCOPY WITH PROPOFOL N/A 02/27/2021   Procedure: COLONOSCOPY WITH PROPOFOL;  Surgeon: Harvel Quale, MD;  [facility];  Service: Gastroenterology;  Laterality: N/A;  7:30   ESOPHAGEAL DILATION N/A 04/04/2015   Procedure: ESOPHAGEAL DILATION;  Surgeon: Rogene Houston, MD;  [facility];  Service: Endoscopy;  Laterality: N/A;   ESOPHAGEAL DILATION N/A 11/09/2019   Procedure: ESOPHAGEAL DILATION;  Surgeon: Harvel Quale, MD;  [facility];  Service: Gastroenterology;  Laterality: N/A;   ESOPHAGOGASTRODUODENOSCOPY N/A 04/04/2015   Procedure: ESOPHAGOGASTRODUODENOSCOPY (EGD);  Surgeon: Rogene Houston, MD;  [facility];  Service: Endoscopy;  Laterality: N/A;  1240   ESOPHAGOGASTRODUODENOSCOPY (EGD) WITH PROPOFOL N/A 11/09/2019   Procedure: ESOPHAGOGASTRODUODENOSCOPY (EGD) WITH PROPOFOL;  Surgeon: Harvel Quale, MD;  [facility];  Service: Gastroenterology;  Laterality: N/A;   HEMORRHOID SURGERY     POLYPECTOMY  11/09/2019   Procedure: POLYPECTOMY;  Surgeon: Harvel Quale, MD;  [facility];  Service: Gastroenterology;;   POLYPECTOMY  02/27/2021   Procedure: POLYPECTOMY;  Surgeon: Harvel Quale, MD;  [facility];  Service: Gastroenterology;;   TONSILLECTOMY         Home Medications    Prior to Admission medications   Medication Sig Start Date End Date Taking? Authorizing Provider  chlorhexidine (HIBICLENS) 4 % external liquid Apply topically daily as needed. 08/14/21  Yes Volney American, PA-C  mupirocin ointment (BACTROBAN) 2 % Apply 1 application. topically 2 (two) times daily. 08/14/21  Yes Volney American, PA-C  sulfamethoxazole-trimethoprim (BACTRIM DS) 800-160 MG tablet Take 1 tablet by mouth 2 (two) times daily for 7 days. 08/14/21 08/21/21 Yes Volney American, PA-C  ACCU-CHEK AVIVA PLUS test strip USE AS  INSTRUCTED 06/13/19   Maryruth Hancock, MD  aspirin EC 81 MG tablet Take 81 mg by mouth daily.    [provider]  atorvastatin (LIPITOR) 80 MG tablet TAKE 1 TABLET BY MOUTH EVERY DAY 05/11/21   Arnoldo Lenis, MD  Calcium-Magnesium-Zinc (CAL-MAG-ZINC PO) Take 1 tablet by mouth daily.    [provider]  Flaxseed, Linseed, (FLAXSEED OIL) 1000 MG CAPS Take 1,000 mg by mouth daily.    [provider]  furosemide (LASIX) 40 MG tablet Take 1 tablet (40 mg total) by mouth daily. 03/17/21   Arnoldo Lenis, MD  gabapentin (NEURONTIN) 300 MG capsule TAKE 1 CAPSULE TWICE A DAY 07/27/21   Wendie Agreste, MD  lisinopril (ZESTRIL) 20 MG tablet Take 1 tablet (20 mg total) by mouth daily. 02/23/21 05/24/21  Wendie Agreste, MD  magnesium oxide (MAG-OX) 400 MG tablet Take 400 mg by mouth at bedtime.    [provider]  metFORMIN (GLUCOPHAGE) 500 MG tablet Take 2 tablets (1,000 mg total) by mouth 2 (two) times daily. 05/06/21   Wendie Agreste, MD  metoprolol succinate (TOPROL-XL) 25 MG 24 hr tablet TAKE 1 TABLET BY MOUTH EVERYDAY AT BEDTIME 06/19/21   Carlyle Dolly  F, MD  Multiple Vitamin (MULTIVITAMIN WITH MINERALS) TABS Take 1 tablet by mouth daily.    [provider]  nitroGLYCERIN (NITROSTAT) 0.4 MG SL tablet Place 1 tablet (0.4 mg total) under the tongue every 5 (five) minutes x 3 doses as needed for chest pain (if no relief after 2nd dose, proceed to ED for an evaluation). 02/11/21   Arnoldo Lenis, MD  omeprazole (PRILOSEC) 40 MG capsule Take 1 capsule (40 mg total) by mouth daily. 03/17/21   Harvel Quale, MD  sildenafil (VIAGRA) 100 MG tablet 1/2-1 p.o. as needed 01/27/21   Franchot Gallo, MD  testosterone cypionate (DEPOTESTOSTERONE CYPIONATE) 200 MG/ML injection Inject 0.5 mLs (100 mg total) into the muscle 2 (two) times a week. 09/25/20   Doree Albee, MD    Family History Family History  Problem Relation Age of Onset   Lung  cancer Mother    Diabetes Father    Hepatitis B Father    Diabetes Sister    Cushing syndrome Sister     Social History Social History   Tobacco Use   Smoking status: Every Day    Packs/day: 1.00    Years: 50.00    Pack years: 50.00    Types: Cigarettes    Start date: 11/26/1967   Smokeless tobacco: Former   Tobacco comments:    smoking x 50 yrs  Vaping Use   Vaping Use: Never used  Substance Use Topics   Alcohol use: Yes    Alcohol/week: 1.0 standard drink    Types: 1 Standard drinks or equivalent per week    Comment: rare   Drug use: Yes    Frequency: 1.0 times per week    Types: Marijuana    Comment: as needed     Allergies   Morphine and related   Review of Systems Review of Systems Per HPI  Physical Exam Triage Vital Signs ED Triage Vitals  Enc Vitals Group     BP 08/14/21 1017 (!) 146/63     Pulse Rate 08/14/21 1017 63     Resp 08/14/21 1017 18     Temp 08/14/21 1017 98 F (36.7 C)     Temp Source 08/14/21 1017 Oral     SpO2 08/14/21 1017 97 %     Weight --      Height --      Head Circumference --      Peak Flow --      Pain Score 08/14/21 1019 7     Pain Loc --      Pain Edu? --      Excl. in [place]? --    No data found.  Updated Vital Signs BP (!) 146/63 (BP Location: Right Arm)   Pulse 63   Temp 98 F (36.7 C) (Oral)   Resp 18   SpO2 97%   Visual Acuity Right Eye Distance:   Left Eye Distance:   Bilateral Distance:    Right Eye Near:   Left Eye Near:    Bilateral Near:     Physical Exam Vitals and nursing note reviewed.  Constitutional:      Appearance: Normal appearance.  HENT:     Head: Atraumatic.  Eyes:     Extraocular Movements: Extraocular movements intact.     Conjunctiva/sclera: Conjunctivae normal.  Cardiovascular:     Rate and Rhythm: Normal rate and regular rhythm.  Pulmonary:     Effort: Pulmonary effort is normal. No respiratory distress.  Musculoskeletal:  General: Normal range of motion.      Cervical back: Normal range of motion and neck supple.  Skin:    General: Skin is warm.     Findings: Erythema present.     Comments: 1 to 1.5 cm superficial ulceration to dorsal left foot with surrounding erythema, edema to the dorsal foot  Neurological:     General: No focal deficit present.     Mental Status: He is oriented to person, place, and time.     Sensory: No sensory deficit.     Motor: No weakness.     Gait: Gait normal.  Psychiatric:        Mood and Affect: Mood normal.        Thought Content: Thought content normal.        Judgment: Judgment normal.   UC Treatments / Results  Labs (all labs ordered are listed, but only abnormal results are displayed) Labs Reviewed - No data to display  EKG  Radiology No results found.  Procedures Procedures (including critical care time)  Medications Ordered in UC Medications - No data to display  Initial Impression / Assessment and Plan / UC Course  I have reviewed the triage vital signs and the nursing notes.  Pertinent labs & imaging results that were available during my care of the patient were reviewed by me and considered in my medical decision making (see chart for details).     Treat with Bactrim, Hibiclens, mupirocin and follow-up with PCP for recheck next week.  Return for worsening symptoms.  Final Clinical Impressions(s) / UC Diagnoses   Final diagnoses:  Ulcer of left foot, limited to breakdown of skin [facility])   Discharge Instructions   None    ED Prescriptions     Medication Sig Dispense Auth. Provider   sulfamethoxazole-trimethoprim (BACTRIM DS) 800-160 MG tablet Take 1 tablet by mouth 2 (two) times daily for 7 days. 14 tablet Volney American, [place]   chlorhexidine (HIBICLENS) 4 % external liquid Apply topically daily as needed. 120 mL Volney American, PA-C   mupirocin ointment (BACTROBAN) 2 % Apply 1 application. topically 2 (two) times daily. 22 g Volney American, [place]       PDMP not reviewed this encounter.   [place], [place], [place] 08/14/21 1054

## 2021-08-14 NOTE — ED Triage Notes (Signed)
Wound on top of left foot x 1 week.  Has appointment with PCP next week.

## 2021-08-20 ENCOUNTER — Ambulatory Visit (INDEPENDENT_AMBULATORY_CARE_PROVIDER_SITE_OTHER): Payer: Medicare Other | Admitting: Family Medicine

## 2021-08-20 ENCOUNTER — Encounter: Payer: Self-pay | Admitting: Family Medicine

## 2021-08-20 VITALS — BP 122/70 | HR 76 | Temp 98.4°F | Resp 16 | Ht 67.0 in | Wt 186.6 lb

## 2021-08-20 DIAGNOSIS — F17210 Nicotine dependence, cigarettes, uncomplicated: Secondary | ICD-10-CM | POA: Diagnosis not present

## 2021-08-20 DIAGNOSIS — I739 Peripheral vascular disease, unspecified: Secondary | ICD-10-CM | POA: Diagnosis not present

## 2021-08-20 DIAGNOSIS — L97529 Non-pressure chronic ulcer of other part of left foot with unspecified severity: Secondary | ICD-10-CM | POA: Diagnosis not present

## 2021-08-20 DIAGNOSIS — R142 Eructation: Secondary | ICD-10-CM

## 2021-08-20 DIAGNOSIS — E1159 Type 2 diabetes mellitus with other circulatory complications: Secondary | ICD-10-CM | POA: Diagnosis not present

## 2021-08-20 LAB — MICROALBUMIN / CREATININE URINE RATIO
Creatinine,U: 47 mg/dL
Microalb Creat Ratio: 1.5 mg/g (ref 0.0–30.0)
Microalb, Ur: <0.7 mg/dL (ref 0.0–1.9)

## 2021-08-20 MED ORDER — CHANTIX STARTING MONTH PAK 0.5 MG X 11 & 1 MG X 42 PO TBPK: ORAL_TABLET | ORAL | 0 refills | Status: DC

## 2021-08-20 MED ORDER — VARENICLINE TARTRATE 1 MG PO TABS: 1.0000 mg | ORAL_TABLET | Freq: Two times a day (BID) | ORAL | 2 refills | Status: DC

## 2021-08-20 MED ORDER — METFORMIN HCL 500 MG PO TABS: 1000.0000 mg | ORAL_TABLET | Freq: Two times a day (BID) | ORAL | 1 refills | Status: DC

## 2021-08-20 NOTE — Progress Notes (Signed)
Subjective:  Patient ID: Jonathan Gilmore, male    DOB: 10/26/1950  Age: 71 y.o. MRN: [id number]  CC:  Chief Complaint  Patient presents with   Wound Check    Pt reports wound on his Lt foot caused about 8 days ago was seen in UC given Abx has not yet finished but was advised to f/u with pcp, notes infection causing worsened swelling where pt already has chronic edema    Gastroesophageal Reflux    Pt reports an excessive amount of belching with a bad odor, notes 3-4 days a week will be bad and other days it will not be present    Diabetes    Due for refill and microalbumin urine    Depression    PHQ-9=13     HPI Clearance D Samarin presents for multiple concerns above  Left foot wound Evaluated through [facility] health urgent care in [place] on June 2nd.  Note reviewed.  1 week history of wound started with a blister on the top of his foot with worsening redness, swelling and the wound was becoming deeper.  Mild drainage.  History of diabetes with PVD and chronic neuropathy with minimal sensation.  Treatment at that time with Silvadene cream and bandages.  No indicated 1 to 1.5 cm superficial ulceration of dorsal left foot with surrounding erythema and edema to the dorsal foot.  Treated with Bactrim DS twice daily for 1 week, Hibiclens, mupirocin.  Has missed some doses of bactrim doses- in pill tray - missed about 6 doses?  Only taking once per day? 4 missed.  No fever. Feels like getting better. Scab, less red.  Using mupirocin at night. Cleaning with peroxide. Did not pick up hibiclens cleanser.  No fever.   Reflux/heartburn Belching with odor few days per week. Not taking any meds, no pain. No dysphagia, no blood in stool, wt loss or night sweats.   Diabetes: Complicated by vascular disease, CAD.  Followed by cardiology with history of aortic stenosis, CAD, hyperlipidemia.  Treatment with metformin 1000 mg twice daily, he is on ACE inhibitor and statin and gabapentin 600 mg nightly for  neuropathic symptoms.  History of disc issues as well as restless legs.  Uncontrolled A1c of 7.9 on March 13.  Some dietary indiscretion at that time.  Home readings were 120-140.  We discussed additional medication but he plans on significant dietary changes with cutting back on carbohydrates and sweets.  6-week fructosamine testing was recommended.  Has not had Cut back on sugars, sweets.  Home readings fasting: none Home readings postprandial: none.  Symptomatic lows -no symptoms.  Microalbumin: Ordered today Optho, foot exam, pneumovax:  Pneumonia vaccine declined.  Lab Results  Component Value Date   HGBA1C 7.9 (A) 05/25/2021   HGBA1C 6.8 (H) 12/01/2020   HGBA1C 7.1 (H) 08/13/2020   Lab Results  Component Value Date   MICROALBUR <0.7 08/20/2021   LDLCALC 38 12/01/2020   CREATININE 0.72 02/25/2021   Positive depression screening Interest in activities, procrastinates. Not feeling down/depressed. Denies anhedonia.      08/20/2021   10:53 AM 05/25/2021   10:45 AM 02/23/2021    1:19 PM 12/01/2020    1:57 PM 08/26/2020    4:20 PM  Depression screen PHQ 2/9  Decreased Interest 3 2 0 0 0  Down, Depressed, Hopeless 0 0 0 0 0  PHQ - 2 Score 3 2 0 0 0  Altered sleeping '3 3 3 '$ 0 0  Tired, decreased energy 2 3  2 0 0  Change in appetite 0 2 0 0 0  Feeling bad or failure about yourself  2 0 0 0 0  Trouble concentrating 0 2 0 0 0  Moving slowly or fidgety/restless 3 0 1 1 0  Suicidal thoughts 0 0 0 0 0  PHQ-9 Score '13 12 6 1 '$ 0  Difficult doing work/chores  Not difficult at all   Not difficult at all   Nicontine addiction: Multiple quit attempts, boot camp helped in past.  Quit in past with cold [place]. Chantix in past - felt weird.  No SI or depression.  Would like to quit. 56 years.   Chronic back pain. Plans to follow up to discuss and   History Patient Active Problem List   Diagnosis Date Noted   Prostate cancer ([place]) 06/08/2021   Esophagitis 02/09/2021   History of  colonic polyps 02/09/2021   Intestinal metaplasia of antrum of stomach without dysplasia 02/09/2021   Insomnia 03/12/2019   Pure hypercholesterolemia 11/29/2018   Hypogonadism, male 11/23/2018   Essential hypertension, benign 11/13/2018   High risk medication use 10/28/2018   Carotid stenosis, asymptomatic, bilateral 10/28/2018   Tobacco use 10/28/2018   Polyp of colon 10/28/2018   Lumbar disc herniation with radiculopathy 12/21/2017   Spinal stenosis of lumbar region without neurogenic claudication 12/21/2017   CAD -50% LAD  08/09/2015   Moderate aortic stenosis 08/09/2015   Acute pulmonary edema ([place]) 16/12/9602   Acute diastolic CHF (congestive heart failure) ([place]) 08/07/2015   Hypersomnia with sleep apnea, unspecified 09/20/2013   Syncope 09/19/2013   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) 02/24/2013   Essential hypertension 02/24/2013   Cervical spondylosis with myelopathy 01/19/2011   Past Medical History:  Diagnosis Date   Abnormal cardiovascular stress test 03/2013   Anxiety    Arthritis    Back pain    CHF (congestive heart failure) ([place])    Coronary artery disease    Multivessel with significant LAD involvement by chest CT 2014   Depression    Diastolic dysfunction    Essential hypertension    Lumbar herniated disc    L4-L5   MRSA (methicillin resistant staph aureus) culture positive    Neuropathy    Shortness of breath dyspnea    Sleep apnea    No cpap   Type 2 diabetes mellitus ([place])    Past Surgical History:   Procedure Laterality Date   ANTERIOR CERVICAL DECOMP/DISCECTOMY FUSION  01/18/2011   Procedure: ANTERIOR CERVICAL DECOMPRESSION/DISCECTOMY FUSION 2 LEVELS;  Surgeon: Cooper Render Pool;  Location: [facility];  Service: Neurosurgery;  Laterality: N/A;  anterior cervical discectomy and fusion with allograft and plating, cervical five-six, cervical six-seven   BIOPSY N/A 04/04/2015   Procedure: BIOPSY;  Surgeon: Rogene Houston, MD;  [facility];  Service: Endoscopy;  Laterality: N/A;   BIOPSY  11/09/2019   Procedure: BIOPSY;  Surgeon: Harvel Quale, MD;  [facility];  Service: Gastroenterology;;   Bone spur removed  1997   Left shoulder   CARDIAC  CATHETERIZATION N/A 08/08/2015   Procedure: Right/Left Heart Cath and Coronary Angiography;  Surgeon: Sherren Mocha, MD;  Location: [facility];  Service: Cardiovascular;  Laterality: N/A;   CARPAL TUNNEL RELEASE Right    COLONOSCOPY WITH PROPOFOL N/A 11/09/2019   Procedure: COLONOSCOPY WITH PROPOFOL;  Surgeon: Harvel Quale, MD;  [facility];  Service: Gastroenterology;  Laterality: N/A;  900   COLONOSCOPY WITH PROPOFOL N/A 02/27/2021   Procedure: COLONOSCOPY WITH PROPOFOL;  Surgeon: Harvel Quale, MD;  [facility];  Service: Gastroenterology;  Laterality: N/A;  7:30   ESOPHAGEAL DILATION N/A 04/04/2015   Procedure: ESOPHAGEAL DILATION;  Surgeon: Rogene Houston, MD;  [facility];  Service: Endoscopy;  Laterality: N/A;   ESOPHAGEAL DILATION N/A 11/09/2019   Procedure: ESOPHAGEAL DILATION;  Surgeon: Harvel Quale, MD;  [facility];  Service: Gastroenterology;  Laterality: N/A;   ESOPHAGOGASTRODUODENOSCOPY N/A 04/04/2015   Procedure: ESOPHAGOGASTRODUODENOSCOPY (EGD);  Surgeon: Rogene Houston, MD;  [facility];  Service: Endoscopy;  Laterality: N/A;  1240   ESOPHAGOGASTRODUODENOSCOPY (EGD) WITH PROPOFOL N/A 11/09/2019    Procedure: ESOPHAGOGASTRODUODENOSCOPY (EGD) WITH PROPOFOL;  Surgeon: Harvel Quale, MD;  [facility];  Service: Gastroenterology;  Laterality: N/A;   HEMORRHOID SURGERY     POLYPECTOMY  11/09/2019   Procedure: POLYPECTOMY;  Surgeon: Harvel Quale, MD;  [facility];  Service: Gastroenterology;;   POLYPECTOMY  02/27/2021   Procedure: POLYPECTOMY;  Surgeon: Harvel Quale, MD;  [facility];  Service: Gastroenterology;;   TONSILLECTOMY     Allergies  Allergen Reactions   Morphine And Related Nausea And Vomiting   Prior to Admission medications   Medication Sig Start Date End Date Taking? Authorizing Provider  ACCU-CHEK AVIVA PLUS test strip USE AS INSTRUCTED 06/13/19   Maryruth Hancock, MD  aspirin EC 81 MG tablet Take 81 mg by mouth daily.    [provider]  atorvastatin (LIPITOR) 80 MG tablet TAKE 1 TABLET BY MOUTH EVERY DAY 05/11/21   Arnoldo Lenis, MD  Calcium-Magnesium-Zinc (CAL-MAG-ZINC PO) Take 1 tablet by mouth daily.    [provider]  chlorhexidine (HIBICLENS) 4 % external liquid Apply topically daily as needed. 08/14/21   Volney American, PA-C  Flaxseed, Linseed, (FLAXSEED OIL) 1000 MG CAPS Take 1,000 mg by mouth daily.    [provider]  furosemide (LASIX) 40 MG tablet Take 1 tablet (40 mg total) by mouth daily. 03/17/21   Arnoldo Lenis, MD  gabapentin (NEURONTIN) 300 MG capsule TAKE 1 CAPSULE TWICE A DAY 07/27/21   Wendie Agreste, MD  lisinopril (ZESTRIL) 20 MG tablet Take 1 tablet (20 mg total) by mouth daily. 02/23/21 05/24/21  Wendie Agreste, MD  magnesium oxide (MAG-OX) 400 MG tablet Take 400 mg by mouth at bedtime.    [provider]  metFORMIN (GLUCOPHAGE) 500 MG tablet Take 2 tablets (1,000 mg total) by mouth 2 (two) times daily. 05/06/21   Wendie Agreste, MD  metoprolol succinate (TOPROL-XL) 25 MG 24 hr tablet TAKE 1 TABLET BY MOUTH EVERYDAY AT BEDTIME  06/19/21   Arnoldo Lenis, MD  Multiple Vitamin (MULTIVITAMIN WITH MINERALS) TABS Take 1 tablet by mouth daily.    [provider]  mupirocin ointment (BACTROBAN) 2 % Apply 1 application. topically 2 (two) times daily. 08/14/21   Volney American, PA-C  nitroGLYCERIN (NITROSTAT) 0.4 MG SL tablet Place 1 tablet (0.4 mg  total) under the tongue every 5 (five) minutes x 3 doses as needed for chest pain (if no relief after 2nd dose, proceed to ED for an evaluation). 02/11/21   Arnoldo Lenis, MD  omeprazole (PRILOSEC) 40 MG capsule Take 1 capsule (40 mg total) by mouth daily. 03/17/21   Harvel Quale, MD  sildenafil (VIAGRA) 100 MG tablet 1/2-1 p.o. as needed 01/27/21   Franchot Gallo, MD  sulfamethoxazole-trimethoprim (BACTRIM DS) 800-160 MG tablet Take 1 tablet by mouth 2 (two) times daily for 7 days. 08/14/21 08/21/21  Volney American, PA-C  testosterone cypionate (DEPOTESTOSTERONE CYPIONATE) 200 MG/ML injection Inject 0.5 mLs (100 mg total) into the muscle 2 (two) times a week. 09/25/20   Doree Albee, MD   Social History   Socioeconomic History   Marital status: Married    Spouse name: Not on file   Number of children: Not on file   Years of education: Not on file   Highest education level: Not on file  Occupational History   Not on file  Tobacco Use   Smoking status: Every Day    Packs/day: 1.00    Years: 50.00    Total pack years: 50.00    Types: Cigarettes    Start date: 11/26/1967   Smokeless tobacco: Former   Tobacco comments:    smoking x 50 yrs  Vaping Use   Vaping Use: Never used  Substance and Sexual Activity   Alcohol use: Yes    Alcohol/week: 1.0 standard drink of alcohol    Types: 1 Standard drinks or equivalent per week    Comment: rare   Drug use: Yes    Frequency: 1.0 times per week    Types: Marijuana    Comment: as needed   Sexual activity: Yes  Other Topics Concern   Not on file  Social History Narrative   Lives with  wife   Right Handed   Drinks 2-3 cups caffeine daily   Social Determinants of Health   Financial Resource Strain: Not on file  Food Insecurity: Not on file  Transportation Needs: Not on file  Physical Activity: Not on file  Stress: Not on file  Social Connections: Not on file  Intimate Partner Violence: Not on file    Review of Systems   Objective:   Vitals:   08/20/21 1051  BP: 122/70  Pulse: 76  Resp: 16  Temp: 98.4 F (36.9 C)  TempSrc: Temporal  SpO2: 96%  Weight: 186 lb 9.6 oz (84.6 kg)  Height: '5\' 7"'$  (1.702 m)     Physical Exam Vitals reviewed.  Constitutional:      Appearance: He is well-developed.  HENT:     Head: Normocephalic and atraumatic.  Neck:     Vascular: No carotid bruit or JVD.  Cardiovascular:     Rate and Rhythm: Normal rate and regular rhythm.     Heart sounds: Normal heart sounds. No murmur heard. Pulmonary:     Effort: Pulmonary effort is normal.     Breath sounds: Normal breath sounds. No rales.  Musculoskeletal:     Right lower leg: No edema.     Left lower leg: No edema.  Skin:    General: Skin is warm and dry.     Comments: Wound left foot, min warmth, intact eschar, no induration.   Neurological:     Mental Status: He is alert and oriented to person, place, and time.  Psychiatric:        Mood  and Affect: Mood normal.       Assessment & Plan:  Haldon EREZ MCCALLUM is a 71 y.o. male . Ulcer of left foot, unspecified ulcer stage ([place]) - Plan: AMB referral to wound care center  -Improving, continue oral antibiotics with consistent dosing, couple comparison for now, avoid peroxide.  Follow-up with wound care with history of PAD and concern for wound healing, although appears to be healing currently.  RTC precautions if worsening erythema or other worsening symptoms.  DM type 2 causing vascular disease ([place]) - Plan: metFORMIN (GLUCOPHAGE) 500 MG tablet, Microalbumin / creatinine urine ratio  -Check microalbumin, but otherwise we  will recheck in the next 3 weeks with labs at that time including A1c.  PAD (peripheral artery disease) ([place]) - Plan: AMB referral to wound care center  -Follow-up with wound care as above.  Cigarette nicotine dependence without complication - Plan: Varenicline Tartrate, Starter, (CHANTIX STARTING MONTH PAK) 0.5 MG X 11 & 1 MG X 42 TBPK, varenicline (CHANTIX CONTINUING MONTH PAK) 1 MG tablet  -Reports ready to quit, would like to try Chantix.  Potential side effects and risks were discussed, starting pack and continuing pack given along with handout on smoking cessation.  Belching  -Possible component of GERD.  Avoid new meds for now, trigger avoidance discussed with handout given.  Recheck 3 weeks.  Meds ordered this encounter  Medications   metFORMIN (GLUCOPHAGE) 500 MG tablet    Sig: Take 2 tablets (1,000 mg total) by mouth 2 (two) times daily.    Dispense:  360 tablet    Refill:  1   Varenicline Tartrate, Starter, (CHANTIX STARTING MONTH PAK) 0.5 MG X 11 & 1 MG X 42 TBPK    Sig: Take one 0.5 mg tablet by mouth once daily for 3 days, then increase to one 0.5 mg tablet twice daily for 4 days, then increase to one 1 mg tablet twice daily.    Dispense:  53 each    Refill:  0   varenicline (CHANTIX CONTINUING MONTH PAK) 1 MG tablet    Sig: Take 1 tablet (1 mg total) by mouth 2 (two) times daily.    Dispense:  60 tablet    Refill:  2   Patient Instructions  Stop peroxide, take bactrim 2 times per day and continue mupirocin ointment for now. I will refer you to wound care.   No med changes today, I will let you know if concerns on labs.   Chantix ordered today. See info below. Follow up if any new side effects.   See foods to avoid below that may cause belching/heartburn. Recheck next few weeks.   Food Choices for Gastroesophageal Reflux Disease, Adult When you have gastroesophageal reflux disease (GERD), the foods you eat and your eating habits are very important. Choosing the right  foods can help ease the discomfort of GERD. Consider working with a dietitian to help you make healthy food choices. What are tips for following this plan? Reading food labels Look for foods that are low in saturated fat. Foods that have less than 5% of daily value (DV) of fat and 0 g of trans fats may help with your symptoms. Cooking Cook foods using methods other than frying. This may include baking, steaming, grilling, or broiling. These are all methods that do not need a lot of fat for cooking. To add flavor, try to use herbs that are low in spice and acidity. Meal planning  Choose healthy foods that are low in  fat, such as fruits, vegetables, whole grains, low-fat dairy products, lean meats, fish, and poultry. Eat frequent, small meals instead of three large meals each day. Eat your meals slowly, in a relaxed setting. Avoid bending over or lying down until 2-3 hours after eating. Limit high-fat foods such as fatty meats or fried foods. Limit your intake of fatty foods, such as oils, butter, and shortening. Avoid the following as told by your health care provider: Foods that cause symptoms. These may be different for different people. Keep a food diary to keep track of foods that cause symptoms. Alcohol. Drinking large amounts of liquid with meals. Eating meals during the 2-3 hours before bed. Lifestyle Maintain a healthy weight. Ask your health care provider what weight is healthy for you. If you need to lose weight, work with your health care provider to do so safely. Exercise for at least 30 minutes on 5 or more days each week, or as told by your health care provider. Avoid wearing clothes that fit tightly around your waist and chest. Do not use any products that contain nicotine or tobacco. These products include cigarettes, chewing tobacco, and vaping devices, such as e-cigarettes. If you need help quitting, ask your health care provider. Sleep with the head of your bed raised. Use a  wedge under the mattress or blocks under the bed frame to raise the head of the bed. Chew sugar-free gum after mealtimes. What foods should I eat?  Eat a healthy, well-balanced diet of fruits, vegetables, whole grains, low-fat dairy products, lean meats, fish, and poultry. Each person is different. Foods that may trigger symptoms in one person may not trigger any symptoms in another person. Work with your health care provider to identify foods that are safe for you. The items listed above may not be a complete list of recommended foods and beverages. Contact a dietitian for more information. What foods should I avoid? Limiting some of these foods may help manage the symptoms of GERD. Everyone is different. Consult a dietitian or your health care provider to help you identify the exact foods to avoid, if any. Fruits Any fruits prepared with added fat. Any fruits that cause symptoms. For some people this may include citrus fruits, such as oranges, grapefruit, pineapple, and lemons. Vegetables Deep-fried vegetables. [place] fries. Any vegetables prepared with added fat. Any vegetables that cause symptoms. For some people, this may include tomatoes and tomato products, chili peppers, onions and garlic, and horseradish. Grains Pastries or quick breads with added fat. Meats and other proteins High-fat meats, such as fatty beef or pork, hot dogs, ribs, ham, sausage, salami, and bacon. Fried meat or protein, including fried fish and fried chicken. Nuts and nut butters, in large amounts. Dairy Whole milk and chocolate milk. Sour cream. Cream. Ice cream. Cream cheese. Milkshakes. Fats and oils Butter. Margarine. Shortening. Ghee. Beverages Coffee and tea, with or without caffeine. Carbonated beverages. Sodas. Energy drinks. Fruit juice made with acidic fruits, such as orange or grapefruit. Tomato juice. Alcoholic drinks. Sweets and desserts Chocolate and cocoa. Donuts. Seasonings and condiments Pepper.  Peppermint and spearmint. Added salt. Any condiments, herbs, or seasonings that cause symptoms. For some people, this may include curry, hot sauce, or vinegar-based salad dressings. The items listed above may not be a complete list of foods and beverages to avoid. Contact a dietitian for more information. Questions to ask your health care provider Diet and lifestyle changes are usually the first steps that are taken to manage symptoms of  GERD. If diet and lifestyle changes do not improve your symptoms, talk with your health care provider about taking medicines. Where to find more information International Foundation for Gastrointestinal Disorders: aboutgerd.org Summary When you have gastroesophageal reflux disease (GERD), food and lifestyle choices may be very helpful in easing the discomfort of GERD. Eat frequent, small meals instead of three large meals each day. Eat your meals slowly, in a relaxed setting. Avoid bending over or lying down until 2-3 hours after eating. Limit high-fat foods such as fatty meats or fried foods. This information is not intended to replace advice given to you by your health care provider. Make sure you discuss any questions you have with your health care provider. Document Revised: 09/10/2019 Document Reviewed: 09/10/2019 Elsevier Patient Education  [place].    Steps to Quit Smoking Smoking tobacco is the leading cause of preventable death. It can affect almost every organ in the body. Smoking puts you and those around you at risk for developing many serious chronic diseases. Quitting smoking can be very challenging. Do not get discouraged if you are not successful the first time. Some people need to make many attempts to quit before they achieve long-term success. Do your best to stick to your quit plan, and talk with your health care provider if you have any questions or concerns. How do I get ready to quit? When you decide to quit smoking, create a  plan to help you succeed. Before you quit: Pick a date to quit. Set a date within the next 2 weeks to give you time to prepare. Write down the reasons why you are quitting. Keep this list in places where you will see it often. Tell your family, friends, and co-workers that you are quitting. Support from people you are close to can make quitting easier. Talk with your health care provider about your options for quitting smoking. Find out what treatment options are covered by your health insurance. Identify people, places, things, and activities that make you want to smoke (triggers). Avoid them. What first steps can I take to quit smoking? Throw away all cigarettes at home, at work, and in your car. Throw away smoking accessories, such as Scientist, research (medical). Clean your car. Make sure to empty the ashtray. Clean your home, including curtains and carpets. What strategies can I use to quit smoking? Talk with your health care provider about combining strategies, such as taking medicines while you are also receiving in-person counseling. Using these two strategies together makes you more likely to succeed in quitting than if you used either strategy on its own. If you are pregnant or breastfeeding, talk with your health care provider about finding counseling or other support strategies to quit smoking. Do not take medicine to help you quit smoking unless your health care provider tells you to. Quit right away Quit smoking completely, instead of gradually reducing how much you smoke over a period of time. Stopping smoking right away may be more successful than gradually quitting. Attend in-person counseling to help you build problem-solving skills. You are more likely to succeed in quitting if you attend counseling sessions regularly. Even short sessions of 10 minutes can be effective. Take medicine You may take medicines to help you quit smoking. Some medicines require a prescription. You can also  purchase over-the-counter medicines. Medicines may have nicotine in them to replace the nicotine in cigarettes. Medicines may: Help to stop cravings. Help to relieve withdrawal symptoms. Your health care provider may recommend:  Nicotine patches, gum, or lozenges. Nicotine inhalers or sprays. Non-nicotine medicine that you take by mouth. Find resources Find resources and support systems that can help you quit smoking and remain smoke-free after you quit. These resources are most helpful when you use them often. They include: Online chats with a Social worker. Telephone quitlines. Printed Furniture conservator/restorer. Support groups or group counseling. Text messaging programs. Mobile phone apps or applications. Use apps that can help you stick to your quit plan by providing reminders, tips, and encouragement. Examples of free services include Quit Guide from the CDC and smokefree.gov  What can I do to make it easier to quit?  Reach out to your family and friends for support and encouragement. Call telephone quitlines, such as 1-800-QUIT-NOW, reach out to support groups, or work with a counselor for support. Ask people who smoke to avoid smoking around you. Avoid places that trigger you to smoke, such as bars, parties, or smoke-break areas at work. Spend time with people who do not smoke. Lessen the stress in your life. Stress can be a smoking trigger for some people. To lessen stress, try: Exercising regularly. Doing deep-breathing exercises. Doing yoga. Meditating. What benefits will I see if I quit smoking? Over time, you should start to see positive results, such as: Improved sense of smell and taste. Decreased coughing and sore throat. Slower heart rate. Lower blood pressure. Clearer and healthier skin. The ability to breathe more easily. Fewer sick days. Summary Quitting smoking can be very challenging. Do not get discouraged if you are not successful the first time. Some people need to  make many attempts to quit before they achieve long-term success. When you decide to quit smoking, create a plan to help you succeed. Quit smoking right away, not slowly over a period of time. Find resources and support systems that can help you quit smoking and remain smoke-free after you quit. This information is not intended to replace advice given to you by your health care provider. Make sure you discuss any questions you have with your health care provider. Document Revised: 02/20/2021 Document Reviewed: 02/20/2021 Elsevier Patient Education  [place],   Merri Ray, MD [place] 08/20/21 10:14 PM

## 2021-08-20 NOTE — Patient Instructions (Addendum)
Stop peroxide, take bactrim 2 times per day and continue mupirocin ointment for now. I will refer you to wound care.   No med changes today, I will let you know if concerns on labs.   Chantix ordered today. See info below. Follow up if any new side effects.   See foods to avoid below that may cause belching/heartburn. Recheck next few weeks.   Food Choices for Gastroesophageal Reflux Disease, Adult When you have gastroesophageal reflux disease (GERD), the foods you eat and your eating habits are very important. Choosing the right foods can help ease the discomfort of GERD. Consider working with a dietitian to help you make healthy food choices. What are tips for following this plan? Reading food labels Look for foods that are low in saturated fat. Foods that have less than 5% of daily value (DV) of fat and 0 g of trans fats may help with your symptoms. Cooking Cook foods using methods other than frying. This may include baking, steaming, grilling, or broiling. These are all methods that do not need a lot of fat for cooking. To add flavor, try to use herbs that are low in spice and acidity. Meal planning  Choose healthy foods that are low in fat, such as fruits, vegetables, whole grains, low-fat dairy products, lean meats, fish, and poultry. Eat frequent, small meals instead of three large meals each day. Eat your meals slowly, in a relaxed setting. Avoid bending over or lying down until 2-3 hours after eating. Limit high-fat foods such as fatty meats or fried foods. Limit your intake of fatty foods, such as oils, butter, and shortening. Avoid the following as told by your health care provider: Foods that cause symptoms. These may be different for different people. Keep a food diary to keep track of foods that cause symptoms. Alcohol. Drinking large amounts of liquid with meals. Eating meals during the 2-3 hours before bed. Lifestyle Maintain a healthy weight. Ask your health care provider  what weight is healthy for you. If you need to lose weight, work with your health care provider to do so safely. Exercise for at least 30 minutes on 5 or more days each week, or as told by your health care provider. Avoid wearing clothes that fit tightly around your waist and chest. Do not use any products that contain nicotine or tobacco. These products include cigarettes, chewing tobacco, and vaping devices, such as e-cigarettes. If you need help quitting, ask your health care provider. Sleep with the head of your bed raised. Use a wedge under the mattress or blocks under the bed frame to raise the head of the bed. Chew sugar-free gum after mealtimes. What foods should I eat?  Eat a healthy, well-balanced diet of fruits, vegetables, whole grains, low-fat dairy products, lean meats, fish, and poultry. Each person is different. Foods that may trigger symptoms in one person may not trigger any symptoms in another person. Work with your health care provider to identify foods that are safe for you. The items listed above may not be a complete list of recommended foods and beverages. Contact a dietitian for more information. What foods should I avoid? Limiting some of these foods may help manage the symptoms of GERD. Everyone is different. Consult a dietitian or your health care provider to help you identify the exact foods to avoid, if any. Fruits Any fruits prepared with added fat. Any fruits that cause symptoms. For some people this may include citrus fruits, such as oranges, grapefruit, pineapple,  and lemons. Vegetables Deep-fried vegetables. [place] fries. Any vegetables prepared with added fat. Any vegetables that cause symptoms. For some people, this may include tomatoes and tomato products, chili peppers, onions and garlic, and horseradish. Grains Pastries or quick breads with added fat. Meats and other proteins High-fat meats, such as fatty beef or pork, hot dogs, ribs, ham, sausage, salami,  and bacon. Fried meat or protein, including fried fish and fried chicken. Nuts and nut butters, in large amounts. Dairy Whole milk and chocolate milk. Sour cream. Cream. Ice cream. Cream cheese. Milkshakes. Fats and oils Butter. Margarine. Shortening. Ghee. Beverages Coffee and tea, with or without caffeine. Carbonated beverages. Sodas. Energy drinks. Fruit juice made with acidic fruits, such as orange or grapefruit. Tomato juice. Alcoholic drinks. Sweets and desserts Chocolate and cocoa. Donuts. Seasonings and condiments Pepper. Peppermint and spearmint. Added salt. Any condiments, herbs, or seasonings that cause symptoms. For some people, this may include curry, hot sauce, or vinegar-based salad dressings. The items listed above may not be a complete list of foods and beverages to avoid. Contact a dietitian for more information. Questions to ask your health care provider Diet and lifestyle changes are usually the first steps that are taken to manage symptoms of GERD. If diet and lifestyle changes do not improve your symptoms, talk with your health care provider about taking medicines. Where to find more information International Foundation for Gastrointestinal Disorders: aboutgerd.org Summary When you have gastroesophageal reflux disease (GERD), food and lifestyle choices may be very helpful in easing the discomfort of GERD. Eat frequent, small meals instead of three large meals each day. Eat your meals slowly, in a relaxed setting. Avoid bending over or lying down until 2-3 hours after eating. Limit high-fat foods such as fatty meats or fried foods. This information is not intended to replace advice given to you by your health care provider. Make sure you discuss any questions you have with your health care provider. Document Revised: 09/10/2019 Document Reviewed: 09/10/2019 Elsevier Patient Education  [place].    Steps to Quit Smoking Smoking tobacco is the leading cause of  preventable death. It can affect almost every organ in the body. Smoking puts you and those around you at risk for developing many serious chronic diseases. Quitting smoking can be very challenging. Do not get discouraged if you are not successful the first time. Some people need to make many attempts to quit before they achieve long-term success. Do your best to stick to your quit plan, and talk with your health care provider if you have any questions or concerns. How do I get ready to quit? When you decide to quit smoking, create a plan to help you succeed. Before you quit: Pick a date to quit. Set a date within the next 2 weeks to give you time to prepare. Write down the reasons why you are quitting. Keep this list in places where you will see it often. Tell your family, friends, and co-workers that you are quitting. Support from people you are close to can make quitting easier. Talk with your health care provider about your options for quitting smoking. Find out what treatment options are covered by your health insurance. Identify people, places, things, and activities that make you want to smoke (triggers). Avoid them. What first steps can I take to quit smoking? Throw away all cigarettes at home, at work, and in your car. Throw away smoking accessories, such as Scientist, research (medical). Clean your car. Make sure to empty  the ashtray. Clean your home, including curtains and carpets. What strategies can I use to quit smoking? Talk with your health care provider about combining strategies, such as taking medicines while you are also receiving in-person counseling. Using these two strategies together makes you more likely to succeed in quitting than if you used either strategy on its own. If you are pregnant or breastfeeding, talk with your health care provider about finding counseling or other support strategies to quit smoking. Do not take medicine to help you quit smoking unless your health care  provider tells you to. Quit right away Quit smoking completely, instead of gradually reducing how much you smoke over a period of time. Stopping smoking right away may be more successful than gradually quitting. Attend in-person counseling to help you build problem-solving skills. You are more likely to succeed in quitting if you attend counseling sessions regularly. Even short sessions of 10 minutes can be effective. Take medicine You may take medicines to help you quit smoking. Some medicines require a prescription. You can also purchase over-the-counter medicines. Medicines may have nicotine in them to replace the nicotine in cigarettes. Medicines may: Help to stop cravings. Help to relieve withdrawal symptoms. Your health care provider may recommend: Nicotine patches, gum, or lozenges. Nicotine inhalers or sprays. Non-nicotine medicine that you take by mouth. Find resources Find resources and support systems that can help you quit smoking and remain smoke-free after you quit. These resources are most helpful when you use them often. They include: Online chats with a Social worker. Telephone quitlines. Printed Furniture conservator/restorer. Support groups or group counseling. Text messaging programs. Mobile phone apps or applications. Use apps that can help you stick to your quit plan by providing reminders, tips, and encouragement. Examples of free services include Quit Guide from the CDC and smokefree.gov  What can I do to make it easier to quit?  Reach out to your family and friends for support and encouragement. Call telephone quitlines, such as 1-800-QUIT-NOW, reach out to support groups, or work with a counselor for support. Ask people who smoke to avoid smoking around you. Avoid places that trigger you to smoke, such as bars, parties, or smoke-break areas at work. Spend time with people who do not smoke. Lessen the stress in your life. Stress can be a smoking trigger for some people. To lessen  stress, try: Exercising regularly. Doing deep-breathing exercises. Doing yoga. Meditating. What benefits will I see if I quit smoking? Over time, you should start to see positive results, such as: Improved sense of smell and taste. Decreased coughing and sore throat. Slower heart rate. Lower blood pressure. Clearer and healthier skin. The ability to breathe more easily. Fewer sick days. Summary Quitting smoking can be very challenging. Do not get discouraged if you are not successful the first time. Some people need to make many attempts to quit before they achieve long-term success. When you decide to quit smoking, create a plan to help you succeed. Quit smoking right away, not slowly over a period of time. Find resources and support systems that can help you quit smoking and remain smoke-free after you quit. This information is not intended to replace advice given to you by your health care provider. Make sure you discuss any questions you have with your health care provider. Document Revised: 02/20/2021 Document Reviewed: 02/20/2021 Elsevier Patient Education  [place].

## 2021-08-31 ENCOUNTER — Other Ambulatory Visit: Payer: Medicare Other

## 2021-08-31 ENCOUNTER — Other Ambulatory Visit: Payer: Self-pay

## 2021-08-31 DIAGNOSIS — C61 Malignant neoplasm of prostate: Secondary | ICD-10-CM

## 2021-09-02 LAB — PSA, TOTAL AND FREE
PSA, Free Pct: 18.8 %
PSA, Free: 0.47 ng/mL
Prostate Specific Ag, Serum: 2.5 ng/mL (ref 0.0–4.0)

## 2021-09-03 ENCOUNTER — Encounter (HOSPITAL_BASED_OUTPATIENT_CLINIC_OR_DEPARTMENT_OTHER): Payer: Medicare Other | Attending: General Surgery | Admitting: General Surgery

## 2021-09-03 DIAGNOSIS — L97529 Non-pressure chronic ulcer of other part of left foot with unspecified severity: Secondary | ICD-10-CM | POA: Insufficient documentation

## 2021-09-03 DIAGNOSIS — G4730 Sleep apnea, unspecified: Secondary | ICD-10-CM | POA: Insufficient documentation

## 2021-09-03 DIAGNOSIS — I110 Hypertensive heart disease with heart failure: Secondary | ICD-10-CM | POA: Insufficient documentation

## 2021-09-03 DIAGNOSIS — I350 Nonrheumatic aortic (valve) stenosis: Secondary | ICD-10-CM | POA: Diagnosis not present

## 2021-09-03 DIAGNOSIS — Z8546 Personal history of malignant neoplasm of prostate: Secondary | ICD-10-CM | POA: Diagnosis not present

## 2021-09-03 DIAGNOSIS — E1151 Type 2 diabetes mellitus with diabetic peripheral angiopathy without gangrene: Secondary | ICD-10-CM | POA: Diagnosis not present

## 2021-09-03 DIAGNOSIS — E1159 Type 2 diabetes mellitus with other circulatory complications: Secondary | ICD-10-CM | POA: Diagnosis not present

## 2021-09-03 DIAGNOSIS — E11621 Type 2 diabetes mellitus with foot ulcer: Secondary | ICD-10-CM | POA: Diagnosis not present

## 2021-09-03 DIAGNOSIS — E785 Hyperlipidemia, unspecified: Secondary | ICD-10-CM | POA: Diagnosis not present

## 2021-09-03 DIAGNOSIS — I5030 Unspecified diastolic (congestive) heart failure: Secondary | ICD-10-CM | POA: Diagnosis not present

## 2021-09-03 DIAGNOSIS — F17210 Nicotine dependence, cigarettes, uncomplicated: Secondary | ICD-10-CM | POA: Diagnosis not present

## 2021-09-03 NOTE — Progress Notes (Signed)
[name], [name] ([id number]) Visit Report for 09/03/2021 Chief Complaint Document Details Patient Name: Date of Service: Jonathan Gilmore [facility] D. 09/03/2021 9:00 A M Medical Record Number: [id number] Patient Account Number: [id number] Date of Birth/Sex: Treating RN: 1950-10-02 (71 y.o. [name] Primary Care Provider: Merri Ray Other Clinician: Referring Provider: Treating Provider/Extender: Johnnette Barrios in Treatment: 0 Information Obtained from: Patient Chief Complaint Patient presents to the wound care center today with an open arterial ulcer Electronic Signature(s) Signed: 09/03/2021 9:28:15 AM By: Fredirick Maudlin MD FACS Entered By: Fredirick Maudlin on 09/03/2021 09:28:15 -------------------------------------------------------------------------------- HPI Details Patient Name: Date of Service: [name], [name] 09/03/2021 9:00 A M Medical Record Number: [id number] Patient Account Number: [id number] Date of Birth/Sex: Treating RN: 18-Sep-1950 (71 y.o. [name] Primary Care Provider: Merri Ray Other Clinician: Referring Provider: Treating Provider/Extender: Johnnette Barrios in Treatment: 0 History of Present Illness HPI Description: ADMISSION 09/03/2021 This is a 71 year old male with a past medical history significant for poorly controlled type 2 diabetes mellitus (last hemoglobin A1c 7.9% on May 25, 2021), peripheral artery disease with multiple stenoses identified on a vascular study performed in 2019, multivessel coronary artery disease, congestive heart failure, neurogenic claudication, and hypertension. He presented to the emergency department in [place] on 2 June with a 1 week history of a wound on the top of his left foot. He was prescribed Bactrim and advised to clean the wound with Hibiclens and apply mupirocin to the site. He subsequently saw his primary care provider on June 8 who referred him to  wound care. Electronic Signature(s) Signed: 09/03/2021 9:31:15 AM By: Fredirick Maudlin MD FACS Entered By: Fredirick Maudlin on 09/03/2021 09:31:14 -------------------------------------------------------------------------------- Problem List Details Patient Name: Date of Service: [name], [name] 09/03/2021 9:00 [place] Record Number: [id number] Patient Account Number: [id number] Date of Birth/Sex: Treating RN: [birth year] (71 y.o. 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

## 2021-09-03 NOTE — Progress Notes (Signed)
CAIRO, AGOSTINELLI ([id number]) Visit Report for 09/03/2021 Abuse Risk Screen Details Patient Name: Date of Service: Jonathan Gilmore. 09/03/2021 9:00 A M Medical Record Number: [id number] Patient Account Number: [id number] Date of Birth/Sex: Treating RN: Dec 06, 1950 (71 y.o. Male) Jonathan Gilmore Primary Care Jonathan Gilmore: Jonathan Gilmore Other Clinician: Referring Jonathan Gilmore: Treating Jonathan Gilmore/Extender: Jonathan Gilmore in Treatment: Gilmore Abuse Risk Screen Items Answer ABUSE RISK SCREEN: Has anyone close to you tried to hurt or harm you recentlyo No Do you feel uncomfortable with anyone in your familyo No Has anyone forced you do things that you didnt want to doo No Electronic Signature(s) Signed: 09/03/2021 5:01:25 PM By: Jonathan Gouty RN, BSN Entered By: Jonathan Gilmore on 09/03/2021 09:43:48 -------------------------------------------------------------------------------- Activities of Daily Living Details Patient Name: Date of Service: Jonathan Haddock [facility] Gilmore. 09/03/2021 9:00 [place] Record Number: [id number] Patient Account Number: [id number] Date of Birth/Sex: Treating RN: 04-Feb-1951 (71 y.o. Male) Jonathan Gilmore Primary Care Eugene Isadore: Jonathan Gilmore Other Clinician: Referring Alegra Rost: Treating Anajulia Leyendecker/Extender: Jonathan Gilmore in Treatment: Gilmore Activities of Daily Living Items Answer Activities of Daily Living (Please select one for each item) Drive Automobile Completely Able T Medications ake Completely Able Use T elephone Completely Able Care for Appearance Completely Able Use T oilet Completely Able Bath / Shower Completely Able Dress Self Completely Able Feed Self Completely Able Walk Completely Able Get In / Out Bed Completely Able Housework Completely Able Prepare Meals Completely Able Handle Money Completely Able Shop for Self Completely Able Electronic Signature(s) Signed: 09/03/2021 5:01:25 PM By: Jonathan Gouty RN,  BSN Entered By: Jonathan Gilmore on 09/03/2021 09:44:08 -------------------------------------------------------------------------------- Education Screening Details Patient Name: Date of Service: Jonathan Gilmore, Jonathan Gilmore. 09/03/2021 9:00 [place] Record Number: [id number] Patient Account Number: [id number] Date of Birth/Sex: Treating RN: 1950/10/30 (71 y.o. Male) Jonathan Gilmore Primary Care Bellamie Turney: Jonathan Gilmore Other Clinician: Referring Thailyn Khalid: Treating Landers Prajapati/Extender: Jonathan Gilmore in Treatment: Gilmore Primary Learner Assessed: Patient Learning Preferences/Education Level/Primary Language Learning Preference: Explanation, Demonstration, Printed Material Highest Education Level: College or Above Preferred Language: English Cognitive Barrier Language Barrier: No Translator Needed: No Memory Deficit: No Emotional Barrier: No Cultural/Religious Beliefs Affecting Medical Care: No Physical Barrier Impaired Vision: No Impaired Hearing: Yes deaf left ear Decreased Hand dexterity: No Knowledge/Comprehension Knowledge Level: High Comprehension Level: High Ability to understand written instructions: High Ability to understand verbal instructions: High Motivation Anxiety Level: Calm Cooperation: Cooperative Education Importance: Acknowledges Need Interest in Health Problems: Asks Questions Perception: Coherent Willingness to Engage in Self-Management High Activities: Readiness to Engage in Self-Management High Activities: Electronic Signature(s) Signed: 09/03/2021 5:01:25 PM By: Jonathan Gouty RN, BSN Entered By: Jonathan Gilmore on 09/03/2021 09:45:31 -------------------------------------------------------------------------------- Fall Risk Assessment Details Patient Name: Date of Service: Jonathan Gilmore, Jonathan Gilmore. 09/03/2021 9:00 A M Medical Record Number: [id number] Patient Account Number: [id number] Date of Birth/Sex: Treating RN: 04/10/1950 (71 y.o.  Male) Jonathan Gilmore Primary Care Tijuan Dantes: Jonathan Gilmore Other Clinician: Referring Stylianos Stradling: Treating Mickie Badders/Extender: Jonathan Gilmore in Treatment: Gilmore Fall Risk Assessment Items Have you had 2 or more falls in the last 12 monthso Gilmore No Have you had any fall that resulted in injury in the last 12 monthso Gilmore No FALLS RISK SCREEN History of falling - immediate or within 3 months Gilmore No Secondary diagnosis (Do you have 2 or more medical diagnoseso) Gilmore No Ambulatory aid None/bed rest/wheelchair/nurse Gilmore Yes Crutches/cane/walker Gilmore No Furniture Gilmore No Intravenous therapy Access/Saline/Heparin Lock Gilmore  No Gait/Transferring Normal/ bed rest/ wheelchair Gilmore Yes Weak (short steps with or without shuffle, stooped but able to lift head while walking, may seek Gilmore No support from furniture) Impaired (short steps with shuffle, may have difficulty arising from chair, head down, impaired Gilmore No balance) Mental Status Oriented to own ability Gilmore Yes Electronic Signature(s) Signed: 09/03/2021 5:01:25 PM By: Jonathan Gouty RN, BSN Entered By: Jonathan Gilmore on 09/03/2021 09:45:43 -------------------------------------------------------------------------------- Foot Assessment Details Patient Name: Date of Service: Jonathan Gilmore, Jonathan Gilmore. 09/03/2021 9:00 [place] Record Number: [id number] Patient Account Number: [id number] Date of Birth/Sex: Treating RN: 05-07-1950 (71 y.o. Male) Jonathan Gilmore Primary Care Kosisochukwu Burningham: Jonathan Gilmore Other Clinician: Referring Asad Keeven: Treating Kathlyne Loud/Extender: Jonathan Gilmore in Treatment: Gilmore Foot Assessment Items Site Locations + = Sensation present, - = Sensation absent, C = Callus, U = Ulcer R = Redness, W = Warmth, M = Maceration, PU = Pre-ulcerative lesion F = Fissure, S = Swelling, Gilmore = Dryness Assessment Right: Left: Other Deformity: No No Prior Foot Ulcer: No No Prior Amputation: No No Charcot Joint: No  No Ambulatory Status: Ambulatory Without Help Gait: Steady Electronic Signature(s) Signed: 09/03/2021 5:01:25 PM By: Jonathan Gouty RN, BSN Entered By: Jonathan Gilmore on 09/03/2021 09:48:51 -------------------------------------------------------------------------------- Nutrition Risk Screening Details Patient Name: Date of Service: Jonathan Gilmore, Jonathan Gilmore. 09/03/2021 9:00 A M Medical Record Number: [id number] Patient Account Number: [id number] Date of Birth/Sex: Treating RN: [birth year] (71 y.o. Male) Jonathan Gilmore Primary Care Ted Leonhart: Jonathan Gilmore Other Clinician: Referring Roselee Tayloe: Treating Dulse Rutan/Extender: Jonathan Gilmore in Treatment: Gilmore Height (in): 66 Weight (lbs): 185 Body Mass Index (BMI): 29.9 Nutrition Risk Screening Items Score Screening NUTRITION RISK SCREEN: I have an illness or condition that made me change the kind and/or amount of food I eat Gilmore No I eat fewer than two meals per day Gilmore No I eat few fruits and vegetables, or milk products Gilmore No I have three or more drinks of beer, liquor or wine almost every day Gilmore No I have tooth or mouth problems that make it hard for me to eat Gilmore No I don't always have enough money to buy the food I need Gilmore No I eat alone most of the time Gilmore No I take three or more different prescribed or over-the-counter drugs a day 1 Yes Without wanting to, I have lost or gained 10 pounds in the last six months Gilmore No I am not always physically able to shop, cook and/or feed myself Gilmore No Nutrition Protocols Good Risk Protocol Gilmore No interventions needed Moderate Risk Protocol High Risk Proctocol Risk Level: Good Risk Score: 1 Electronic Signature(s) Signed: 09/03/2021 5:01:25 PM By: Jonathan Gouty RN, BSN Entered By: Jonathan Gilmore on 09/03/2021 09:46:00

## 2021-09-03 NOTE — Progress Notes (Signed)
Jonathan Gilmore, Jonathan Gilmore ([id number]) Visit Report for 09/03/2021 Allergy List Details Patient Name: Date of Service: Jonathan Haddock [facility] D. 09/03/2021 9:00 [place] Record Number: [id number] Patient Account Number: [id number] Date of Birth/Sex: Treating RN: [birth year] (71 y.o. Male) Baruch Gouty Primary Care Hosie Sharman: Merri Ray Other Clinician: Referring Lerry Cordrey: Treating Gurman Ashland/Extender: Johnnette Barrios in Treatment: 0 Allergies Active Allergies morphine Reaction: nausea/vomiting Allergy Notes Electronic Signature(s) Signed: 09/03/2021 5:01:25 PM By: Baruch Gouty RN, BSN Entered By: Baruch Gouty on 09/03/2021 09:32:58 -------------------------------------------------------------------------------- Arrival Information Details Patient Name: Date of Service: Jonathan Gilmore, Jonathan SCHIE D. 09/03/2021 9:00 [place] Record Number: [id number] Patient Account Number: [id number] Date of Birth/Sex: Treating RN: 1950-04-27 (71 y.o. Male) Adline Peals Primary Care Shalice Woodring: Merri Ray Other Clinician: Referring Kalea Perine: Treating Sherryl Valido/Extender: Johnnette Barrios in Treatment: 0 Visit Information Patient Arrived: Ambulatory Arrival Time: 09:12 Accompanied By: self Transfer Assistance: None Patient Identification Verified: Yes Secondary Verification Process Completed: Yes Patient Requires Transmission-Based Precautions: No Patient Has Alerts: No Electronic Signature(s) Signed: 09/03/2021 5:07:36 PM By: Adline Peals Entered By: Adline Peals on 09/03/2021 09:14:54 -------------------------------------------------------------------------------- Clinic Level of Care Assessment Details Patient Name: Date of Service: Jonathan Haddock [facility] D. 09/03/2021 9:00 A M Medical Record Number: [id number] Patient Account Number: [id number] Date of Birth/Sex: Treating RN: Nov 09, 1950 (71 y.o. Male) Baruch Gouty Primary Care Roby Spalla: Merri Ray Other Clinician: Referring Adalida Garver: Treating Roxas Clymer/Extender: Johnnette Barrios in Treatment: 0 Clinic Level of Care Assessment Items TOOL 2 Quantity Score '[]'$  - 0 Use when only an EandM is performed on the INITIAL visit ASSESSMENTS - Nursing Assessment / Reassessment X- 1 20 General Physical Exam (combine w/ comprehensive assessment (listed just below) when performed on new pt. evals) X- 1 25 Comprehensive Assessment (HX, ROS, Risk Assessments, Wounds Hx, etc.) ASSESSMENTS - Wound and Skin A ssessment / Reassessment X - Simple Wound Assessment / Reassessment - one wound 1 5 '[]'$  - 0 Complex Wound Assessment / Reassessment - multiple wounds '[]'$  - 0 Dermatologic / Skin Assessment (not related to wound area) ASSESSMENTS - Ostomy and/or Continence Assessment and Care '[]'$  - 0 Incontinence Assessment and Management '[]'$  - 0 Ostomy Care Assessment and Management (repouching, etc.) PROCESS - Coordination of Care X - Simple Patient / Family Education for ongoing care 1 15 '[]'$  - 0 Complex (extensive) Patient / Family Education for ongoing care X- 1 10 Staff obtains Programmer, systems, Records, T Results / Process Orders est '[]'$  - 0 Staff telephones HHA, Nursing Homes / Clarify orders / etc '[]'$  - 0 Routine Transfer to another Facility (non-emergent condition) '[]'$  - 0 Routine Hospital Admission (non-emergent condition) X- 1 15 New Admissions / Biomedical engineer / Ordering NPWT Apligraf, etc. , '[]'$  - 0 Emergency Hospital Admission (emergent condition) X- 1 10 Simple Discharge Coordination '[]'$  - 0 Complex (extensive) Discharge Coordination PROCESS - Special Needs '[]'$  - 0 Pediatric / Minor Patient Management '[]'$  - 0 Isolation Patient Management '[]'$  - 0 Hearing / Language / Visual special needs '[]'$  - 0 Assessment of Community assistance (transportation, D/C planning, etc.) '[]'$  - 0 Additional assistance / Altered mentation '[]'$  - 0 Support Surface(s) Assessment  (bed, cushion, seat, etc.) INTERVENTIONS - Wound Cleansing / Measurement X- 1 5 Wound Imaging (photographs - any number of wounds) '[]'$  - 0 Wound Tracing (instead of photographs) X- 1 5 Simple Wound Measurement - one wound '[]'$  - 0 Complex Wound Measurement - multiple wounds X- 1 5 Simple Wound Cleansing - one  wound '[]'$  - 0 Complex Wound Cleansing - multiple wounds INTERVENTIONS - Wound Dressings X - Small Wound Dressing one or multiple wounds 1 10 '[]'$  - 0 Medium Wound Dressing one or multiple wounds '[]'$  - 0 Large Wound Dressing one or multiple wounds '[]'$  - 0 Application of Medications - injection INTERVENTIONS - Miscellaneous '[]'$  - 0 External ear exam '[]'$  - 0 Specimen Collection (cultures, biopsies, blood, body fluids, etc.) '[]'$  - 0 Specimen(s) / Culture(s) sent or taken to Lab for analysis '[]'$  - 0 Patient Transfer (multiple staff / Harrel Lemon Lift / Similar devices) '[]'$  - 0 Simple Staple / Suture removal (25 or less) '[]'$  - 0 Complex Staple / Suture removal (26 or more) '[]'$  - 0 Hypo / Hyperglycemic Management (close monitor of Blood Glucose) X- 1 15 Ankle / Brachial Index (ABI) - do not check if billed separately Has the patient been seen at the hospital within the last three years: Yes Total Score: 140 Level Of Care: New/Established - Level 4 Electronic Signature(s) Signed: 09/03/2021 5:01:25 PM By: Baruch Gouty RN, BSN Entered By: Baruch Gouty on 09/03/2021 10:13:47 -------------------------------------------------------------------------------- Encounter Discharge Information Details Patient Name: Date of Service: Jonathan Gilmore, Jonathan SCHIE D. 09/03/2021 9:00 A M Medical Record Number: [id number] Patient Account Number: [id number] Date of Birth/Sex: Treating RN: 1950-07-24 (71 y.o. Male) Baruch Gouty Primary Care Taunia Frasco: Merri Ray Other Clinician: Referring Larence Thone: Treating Ashe Graybeal/Extender: Johnnette Barrios in Treatment: 0 Encounter Discharge  Information Items Discharge Condition: Stable Ambulatory Status: Ambulatory Discharge Destination: Home Transportation: Private Auto Accompanied By: self Schedule Follow-up Appointment: Yes Clinical Summary of Care: Patient Declined Electronic Signature(s) Signed: 09/03/2021 5:01:25 PM By: Baruch Gouty RN, BSN Entered By: Baruch Gouty on 09/03/2021 10:26:11 -------------------------------------------------------------------------------- Lower Extremity Assessment Details Patient Name: Date of Service: Jonathan Gilmore, Jonathan SCHIE D. 09/03/2021 9:00 A M Medical Record Number: [id number] Patient Account Number: [id number] Date of Birth/Sex: Treating RN: 15-Dec-1950 (71 y.o. Male) Baruch Gouty Primary Care Arne Schlender: Merri Ray Other Clinician: Referring Laurajean Hosek: Treating Jhace Fennell/Extender: Johnnette Barrios in Treatment: 0 Edema Assessment Assessed: Shirlyn Goltz: No] [Right: No] Edema: [Left: N] [Right: o] Calf Left: Right: Point of Measurement: From Medial Instep 36.5 cm Ankle Left: Right: Point of Measurement: From Medial Instep 21.5 cm Vascular Assessment Pulses: Dorsalis Pedis Palpable: [Left:Yes] Blood Pressure: Brachial: [Left:114] Ankle: [Left:Dorsalis Pedis: 80 0.70] Electronic Signature(s) Signed: 09/03/2021 5:01:25 PM By: Baruch Gouty RN, BSN Entered By: Baruch Gouty on 09/03/2021 10:03:18 -------------------------------------------------------------------------------- Multi Wound Chart Details Patient Name: Date of Service: Jonathan Gilmore, Jonathan SCHIE D. 09/03/2021 9:00 A M Medical Record Number: [id number] Patient Account Number: [id number] Date of Birth/Sex: Treating RN: [birth year] (71 y.o. Male) Baruch Gouty Primary Care Lizzie Cokley: Merri Ray Other Clinician: Referring Cleotilde Spadaccini: Treating Natalie Leclaire/Extender: Johnnette Barrios in Treatment: 0 Vital Signs Height(in): 66 Capillary Blood Glucose(mg/dl): 165 Weight(lbs):  185 Pulse(bpm): 59 Body Mass Index(BMI): 29.9 Blood Pressure(mmHg): 114/48 Temperature(F): 98.4 Respiratory Rate(breaths/min): 16 Photos: [N/A:N/A] Left, Dorsal Foot N/A N/A Wound Location: Blister N/A N/A Wounding Event: Diabetic Wound/Ulcer of the Lower N/A N/A Primary Etiology: Extremity Sleep Apnea, Congestive Heart N/A N/A Comorbid History: Failure, Coronary Artery Disease, Hypertension, Peripheral Arterial Disease, Type II Diabetes 08/13/2021 N/A N/A Date Acquired: 0 N/A N/A Weeks of Treatment: Open N/A N/A Wound Status: No N/A N/A Wound Recurrence: 0.9x1.1x0.1 N/A N/A Measurements L x W x D (cm) 0.778 N/A N/A A (cm) : rea 0.078 N/A N/A Volume (cm) : 0.00% N/A N/A % Reduction in A rea: 0.00% N/A N/A %  Reduction in Volume: Grade 1 N/A N/A Classification: Medium N/A N/A Exudate A mount: Purulent N/A N/A Exudate Type: yellow, brown, green N/A N/A Exudate Color: Flat and Intact N/A N/A Wound Margin: Medium (34-66%) N/A N/A Granulation A mount: Pink N/A N/A Granulation Quality: Medium (34-66%) N/A N/A Necrotic A mount: Fat Layer (Subcutaneous Tissue): Yes N/A N/A Exposed Structures: Fascia: No Tendon: No Muscle: No Joint: No Bone: No Small (1-33%) N/A N/A Epithelialization: Treatment Notes Wound #1 (Foot) Wound Laterality: Dorsal, Left Cleanser Peri-Wound Care Topical Mupirocin Ointment Discharge Instruction: Apply Mupirocin (Bactroban) as instructed Primary Dressing KerraCel Ag Gelling Fiber Dressing, 2x2 in (silver alginate) Discharge Instruction: Apply silver alginate to wound bed as instructed Secondary Dressing Bordered Gauze, 2x2 in Discharge Instruction: Apply over primary dressing as directed. Secured With Compression Wrap Compression Stockings Environmental education officer) Signed: 09/03/2021 3:27:29 PM By: Fredirick Maudlin MD FACS Signed: 09/03/2021 5:01:25 PM By: Baruch Gouty RN, BSN Previous Signature: 09/03/2021  10:15:13 AM Version By: Fredirick Maudlin MD FACS Entered By: Fredirick Maudlin on 09/03/2021 15:27:29 -------------------------------------------------------------------------------- Multi-Disciplinary Care Plan Details Patient Name: Date of Service: Jonathan Gilmore, Jonathan SCHIE D. 09/03/2021 9:00 [place] Record Number: [id number] Patient Account Number: [id number] Date of Birth/Sex: Treating RN: 10-Jan-1951 (71 y.o. Male) Baruch Gouty Primary Care Jaana Brodt: Merri Ray Other Clinician: Referring Iliany Losier: Treating Malcolm Quast/Extender: Johnnette Barrios in Treatment: 0 Multidisciplinary Care Plan reviewed with physician Active Inactive Nutrition Nursing Diagnoses: Impaired glucose control: actual or potential Goals: Patient/caregiver will maintain therapeutic glucose control Date Initiated: 09/03/2021 Target Resolution Date: 10/01/2021 Goal Status: Active Interventions: Assess HgA1c results as ordered upon admission and as needed Assess patient nutrition upon admission and as needed per policy Provide education on elevated blood sugars and impact on wound healing Treatment Activities: Giving encouragement to exercise : 09/03/2021 Patient referred to Primary Care Physician for further nutritional evaluation : 09/03/2021 Notes: Tissue Oxygenation Nursing Diagnoses: Actual ineffective tissue perfusion; peripheral (select once diagnosis is confirmed) Goals: Patient/caregiver will verbalize understanding of disease process and disease management Date Initiated: 09/03/2021 Target Resolution Date: 10/01/2021 Goal Status: Active Interventions: Assess patient understanding of disease process and management upon diagnosis and as needed Assess peripheral arterial status upon admission and as needed Notes: Wound/Skin Impairment Nursing Diagnoses: Impaired tissue integrity Knowledge deficit related to ulceration/compromised skin integrity Goals: Patient/caregiver will  verbalize understanding of skin care regimen Date Initiated: 09/03/2021 Target Resolution Date: 10/01/2021 Goal Status: Active Ulcer/skin breakdown will have a volume reduction of 30% by week 4 Date Initiated: 09/03/2021 Target Resolution Date: 10/01/2021 Goal Status: Active Interventions: Assess patient/caregiver ability to obtain necessary supplies Assess patient/caregiver ability to perform ulcer/skin care regimen upon admission and as needed Assess ulceration(s) every visit Provide education on ulcer and skin care Treatment Activities: Skin care regimen initiated : 09/03/2021 Topical wound management initiated : 09/03/2021 Notes: Electronic Signature(s) Signed: 09/03/2021 5:01:25 PM By: Baruch Gouty RN, BSN Entered By: Baruch Gouty on 09/03/2021 10:12:03 -------------------------------------------------------------------------------- Pain Assessment Details Patient Name: Date of Service: Jonathan Gilmore, Jonathan SCHIE D. 09/03/2021 9:00 A M Medical Record Number: [id number] Patient Account Number: [id number] Date of Birth/Sex: Treating RN: [birth year] (71 y.o. Male) Baruch Gouty Primary Care Evalie Hargraves: Merri Ray Other Clinician: Referring Ceonna Frazzini: Treating Magen Suriano/Extender: Johnnette Barrios in Treatment: 0 Active Problems Location of Pain Severity and Description of Pain Patient Has Paino No Site Locations Rate the pain. Current Pain Level: 0 Pain Management and Medication Current Pain Management: Electronic Signature(s) Signed: 09/03/2021 5:01:25 PM By: Baruch Gouty RN, BSN  Entered By: Baruch Gouty on 09/03/2021 09:50:26 -------------------------------------------------------------------------------- Patient/Caregiver Education Details Patient Name: Date of Service: Jonathan [place] Schneider. 6/22/2023andnbsp9:00 [place] Record Number: [id number] Patient Account Number: [id number] Date of Birth/Gender: Treating RN: 07-09-1950 (71 y.o. Male)  Baruch Gouty Primary Care Physician: Merri Ray Other Clinician: Referring Physician: Treating Physician/Extender: Johnnette Barrios in Treatment: 0 Education Assessment Education Provided To: Patient Education Topics Provided Elevated Blood Sugar/ Impact on Healing: Methods: Explain/Verbal, Printed Responses: Reinforcements needed, State content correctly Wound/Skin Impairment: Handouts: Caring for Your Ulcer, Skin Care Do's and Dont's, Smoking and Wound Healing Methods: Explain/Verbal, Printed Responses: Reinforcements needed, State content correctly Electronic Signature(s) Signed: 09/03/2021 5:01:25 PM By: Baruch Gouty RN, BSN Signed: 09/03/2021 5:01:25 PM By: Baruch Gouty RN, BSN Entered By: Baruch Gouty on 09/03/2021 10:12:43 -------------------------------------------------------------------------------- Wound Assessment Details Patient Name: Date of Service: Jonathan Gilmore, Jonathan SCHIE D. 09/03/2021 9:00 A M Medical Record Number: [id number] Patient Account Number: [id number] Date of Birth/Sex: Treating RN: 1950/04/07 (71 y.o. Male) Baruch Gouty Primary Care Kazi Reppond: Merri Ray Other Clinician: Referring Coleen Cardiff: Treating Pratham Cassatt/Extender: Johnnette Barrios in Treatment: 0 Wound Status Wound Number: 1 Primary Diabetic Wound/Ulcer of the Lower Extremity Etiology: Wound Location: Left, Dorsal Foot Wound Open Wounding Event: Blister Status: Date Acquired: 08/13/2021 Comorbid Sleep Apnea, Congestive Heart Failure, Coronary Artery Disease, Weeks Of Treatment: 0 History: Hypertension, Peripheral Arterial Disease, Type II Diabetes Clustered Wound: No Photos Wound Measurements Length: (cm) 0.9 Width: (cm) 1.1 Depth: (cm) 0.1 Area: (cm) 0.778 Volume: (cm) 0.078 % Reduction in Area: 0% % Reduction in Volume: 0% Epithelialization: Small (1-33%) Tunneling: No Undermining: No Wound Description Classification:  Grade 1 Wound Margin: Flat and Intact Exudate Amount: Medium Exudate Type: Purulent Exudate Color: yellow, brown, green Foul Odor After Cleansing: No Slough/Fibrino No Wound Bed Granulation Amount: Medium (34-66%) Exposed Structure Granulation Quality: Pink Fascia Exposed: No Necrotic Amount: Medium (34-66%) Fat Layer (Subcutaneous Tissue) Exposed: Yes Necrotic Quality: Adherent Slough Tendon Exposed: No Muscle Exposed: No Joint Exposed: No Bone Exposed: No Treatment Notes Wound #1 (Foot) Wound Laterality: Dorsal, Left Cleanser Peri-Wound Care Topical Mupirocin Ointment Discharge Instruction: Apply Mupirocin (Bactroban) as instructed Primary Dressing KerraCel Ag Gelling Fiber Dressing, 2x2 in (silver alginate) Discharge Instruction: Apply silver alginate to wound bed as instructed Secondary Dressing Bordered Gauze, 2x2 in Discharge Instruction: Apply over primary dressing as directed. Secured With Compression Wrap Compression Stockings Environmental education officer) Signed: 09/03/2021 5:01:25 PM By: Baruch Gouty RN, BSN Entered By: Baruch Gouty on 09/03/2021 09:52:56 -------------------------------------------------------------------------------- Vitals Details Patient Name: Date of Service: Jonathan Gilmore, Jonathan SCHIE D. 09/03/2021 9:00 [place] Record Number: [id number] Patient Account Number: [id number] Date of Birth/Sex: Treating RN: 06-18-1950 (71 y.o. Male) Adline Peals Primary Care Margaretha Mahan: Merri Ray Other Clinician: Referring Fong Mccarry: Treating Kaydin Karbowski/Extender: Johnnette Barrios in Treatment: 0 Vital Signs Time Taken: 09:16 Temperature (F): 98.4 Height (in): 66 Pulse (bpm): 60 Source: Stated Respiratory Rate (breaths/min): 16 Weight (lbs): 185 Blood Pressure (mmHg): 114/48 Source: Stated Capillary Blood Glucose (mg/dl): 165 Body Mass Index (BMI): 29.9 Reference Range: 80 - 120 mg / dl Electronic  Signature(s) Signed: 09/03/2021 5:07:36 PM By: Adline Peals Entered By: Adline Peals on 09/03/2021 09:18:08

## 2021-09-07 ENCOUNTER — Ambulatory Visit (INDEPENDENT_AMBULATORY_CARE_PROVIDER_SITE_OTHER): Payer: Medicare Other | Admitting: Urology

## 2021-09-07 ENCOUNTER — Encounter: Payer: Self-pay | Admitting: Urology

## 2021-09-07 VITALS — BP 138/60 | HR 59

## 2021-09-07 DIAGNOSIS — C61 Malignant neoplasm of prostate: Secondary | ICD-10-CM | POA: Diagnosis not present

## 2021-09-07 LAB — URINALYSIS, ROUTINE W REFLEX MICROSCOPIC
Bilirubin, UA: NEGATIVE
Glucose, UA: NEGATIVE
Ketones, UA: NEGATIVE
Leukocytes,UA: NEGATIVE
Nitrite, UA: NEGATIVE
Protein,UA: NEGATIVE
RBC, UA: NEGATIVE
Specific Gravity, UA: 1.015 (ref 1.005–1.030)
Urobilinogen, Ur: 1 mg/dL (ref 0.2–1.0)
pH, UA: 7 (ref 5.0–7.5)

## 2021-09-07 NOTE — Progress Notes (Signed)
09/07/2021 10:53 AM   Jonathan Gilmore Jul 05, 1950 [id number]  Referring provider: Wendie Agreste, MD [address] [place],  [place] [address]  Followup prostate cancer   HPI: Jonathan Gilmore is a 71yo here for followup for prostate cancer. PSA decreased to 2.5 from 4.0 He remains on surveillance for his prostate cancer. He denies nay significant LUTS. IPSS 6 QOL 0. No other complaints today.    PMH: Past Medical History:  Diagnosis Date   Abnormal cardiovascular stress test 03/2013   Anxiety    Arthritis    Back pain    CHF (congestive heart failure) (HCC)    Coronary artery disease    Multivessel with significant LAD involvement by chest CT 2014   Depression    Diastolic dysfunction    Essential hypertension    Lumbar herniated disc    L4-L5   MRSA (methicillin resistant staph aureus) culture positive    Neuropathy    Shortness of breath dyspnea    Sleep apnea    No cpap   Type 2 diabetes mellitus [facility])     Surgical History: Past Surgical History:  Procedure Laterality Date   ANTERIOR CERVICAL DECOMP/DISCECTOMY FUSION  01/18/2011   Procedure: ANTERIOR CERVICAL DECOMPRESSION/DISCECTOMY FUSION 2 LEVELS;  Surgeon: Cooper Render Pool;  Location: [facility];  Service: Neurosurgery;  Laterality: N/A;  anterior cervical discectomy and fusion with allograft and plating, cervical five-six, cervical six-seven   BIOPSY N/A 04/04/2015   Procedure: BIOPSY;  Surgeon: Rogene Houston, MD;  [facility];  Service: Endoscopy;  Laterality: N/A;   BIOPSY  11/09/2019   Procedure: BIOPSY;  Surgeon: Harvel Quale, MD;  [facility]: [facility];  Service: Gastroenterology;;   Bone spur removed  1997   Left shoulder   CARDIAC CATHETERIZATION N/A 08/08/2015   Procedure: Right/Left Heart Cath and Coronary Angiography;  Surgeon: Sherren Mocha, MD;  Location: [facility];  Service: Cardiovascular;  Laterality: N/A;   CARPAL TUNNEL RELEASE Right    COLONOSCOPY WITH  PROPOFOL N/A 11/09/2019   Procedure: COLONOSCOPY WITH PROPOFOL;  Surgeon: Harvel Quale, MD;  [facility];  Service: Gastroenterology;  Laterality: N/A;  900   COLONOSCOPY WITH PROPOFOL N/A 02/27/2021   Procedure: COLONOSCOPY WITH PROPOFOL;  Surgeon: Harvel Quale, MD;  [facility];  Service: Gastroenterology;  Laterality: N/A;  7:30   ESOPHAGEAL DILATION N/A 04/04/2015   Procedure: ESOPHAGEAL DILATION;  Surgeon: Rogene Houston, MD;  [facility];  Service: Endoscopy;  Laterality: N/A;   ESOPHAGEAL DILATION N/A 11/09/2019   Procedure: ESOPHAGEAL DILATION;  Surgeon: Harvel Quale, MD;  [facility];  Service: Gastroenterology;  Laterality: N/A;   ESOPHAGOGASTRODUODENOSCOPY N/A 04/04/2015   Procedure: ESOPHAGOGASTRODUODENOSCOPY (EGD);  Surgeon: Rogene Houston, MD;  [facility];  Service: Endoscopy;  Laterality: N/A;  1240   ESOPHAGOGASTRODUODENOSCOPY (EGD) WITH PROPOFOL N/A 11/09/2019   Procedure: ESOPHAGOGASTRODUODENOSCOPY (EGD) WITH PROPOFOL;  Surgeon: Harvel Quale, MD;  [facility];  Service: Gastroenterology;  Laterality: N/A;   HEMORRHOID SURGERY     POLYPECTOMY  11/09/2019   Procedure: POLYPECTOMY;  Surgeon: Harvel Quale, MD;  [facility];  Service: Gastroenterology;;   POLYPECTOMY  02/27/2021   Procedure: POLYPECTOMY;  Surgeon: Harvel Quale, MD;  [facility];  Service: Gastroenterology;;   TONSILLECTOMY      Home Medications:  Allergies as of 09/07/2021  Reactions   Morphine And Related Nausea And Vomiting        Medication List        Accurate as of September 07, 2021 10:53 AM. If you have any questions, ask your nurse or doctor.          Accu-Chek Aviva Plus test strip Generic drug: glucose blood USE AS INSTRUCTED   aspirin EC 81 MG tablet Take 81 mg by mouth daily.   atorvastatin 80 MG tablet Commonly  known as: LIPITOR TAKE 1 TABLET BY MOUTH EVERY DAY   CAL-MAG-ZINC PO Take 1 tablet by mouth daily.   Chantix Starting Month Pak 0.5 MG X 11 & 1 MG X 42 Tbpk Generic drug: Varenicline Tartrate (Starter) Take one 0.5 mg tablet by mouth once daily for 3 days, then increase to one 0.5 mg tablet twice daily for 4 days, then increase to one 1 mg tablet twice daily.   varenicline 1 MG tablet Commonly known as: Chantix Continuing Month Pak Take 1 tablet (1 mg total) by mouth 2 (two) times daily.   chlorhexidine 4 % external liquid Commonly known as: Hibiclens Apply topically daily as needed.   Flaxseed Oil 1000 MG Caps Take 1,000 mg by mouth daily.   furosemide 40 MG tablet Commonly known as: LASIX Take 1 tablet (40 mg total) by mouth daily.   gabapentin 300 MG capsule Commonly known as: NEURONTIN TAKE 1 CAPSULE TWICE A DAY   lisinopril 20 MG tablet Commonly known as: ZESTRIL Take 1 tablet (20 mg total) by mouth daily.   magnesium oxide 400 MG tablet Commonly known as: MAG-OX Take 400 mg by mouth at bedtime.   metFORMIN 500 MG tablet Commonly known as: GLUCOPHAGE Take 2 tablets (1,000 mg total) by mouth 2 (two) times daily.   metoprolol succinate 25 MG 24 hr tablet Commonly known as: TOPROL-XL TAKE 1 TABLET BY MOUTH EVERYDAY AT BEDTIME   multivitamin with minerals Tabs tablet Take 1 tablet by mouth daily.   mupirocin ointment 2 % Commonly known as: BACTROBAN Apply 1 application. topically 2 (two) times daily.   nitroGLYCERIN 0.4 MG SL tablet Commonly known as: NITROSTAT Place 1 tablet (0.4 mg total) under the tongue every 5 (five) minutes x 3 doses as needed for chest pain (if no relief after 2nd dose, proceed to ED for an evaluation).   omeprazole 40 MG capsule Commonly known as: PRILOSEC Take 1 capsule (40 mg total) by mouth daily.   sildenafil 100 MG tablet Commonly known as: VIAGRA 1/2-1 p.o. as needed   testosterone cypionate 200 MG/ML injection Commonly  known as: DEPOTESTOSTERONE CYPIONATE Inject 0.5 mLs (100 mg total) into the muscle 2 (two) times a week.        Allergies:  Allergies  Allergen Reactions   Morphine And Related Nausea And Vomiting    Family History: Family History  Problem Relation Age of Onset   Lung cancer Mother    Diabetes Father    Hepatitis B Father    Diabetes Sister    Cushing syndrome Sister     Social History:  reports that he has been smoking cigarettes. He started smoking about 53 years ago. He has a 50.00 pack-year smoking history. He has quit using smokeless tobacco. He reports current alcohol use of about 1.0 standard drink of alcohol per week. He reports current drug use. Frequency: 1.00 time per week. Drug: Marijuana.  ROS: All other review of systems were reviewed and are negative except what is noted  above in HPI  Physical Exam: BP 138/60   Pulse (!) 59   Constitutional:  Alert and oriented, No acute distress. HEENT: [place] AT, moist mucus membranes.  Trachea midline, no masses. Cardiovascular: No clubbing, cyanosis, or edema. Respiratory: Normal respiratory effort, no increased work of breathing. GI: Abdomen is soft, nontender, nondistended, no abdominal masses GU: No CVA tenderness.  Lymph: No cervical or inguinal lymphadenopathy. Skin: No rashes, bruises or suspicious lesions. Neurologic: Grossly intact, no focal deficits, moving all 4 extremities. Psychiatric: Normal mood and affect.  Laboratory Data: Lab Results  Component Value Date   WBC 8.6 12/01/2020   HGB 14.7 12/01/2020   HCT 44.1 12/01/2020   MCV 90.4 12/01/2020   PLT 206.0 12/01/2020    Lab Results  Component Value Date   CREATININE 0.72 02/25/2021    Lab Results  Component Value Date   PSA 3.85 12/01/2020   PSA 2.1 02/26/2019    Lab Results  Component Value Date   TESTOSTERONE 241.06 (L) 12/01/2020    Lab Results  Component Value Date   HGBA1C 7.9 (A) 05/25/2021    Urinalysis    Component Value  Date/Time   COLORURINE STRAW (A) 01/16/2018 0938   APPEARANCEUR CLEAR 01/16/2018 0938   LABSPEC 1.008 01/16/2018 0938   PHURINE 7.0 01/16/2018 0938   GLUCOSEU NEGATIVE 01/16/2018 0938   HGBUR NEGATIVE 01/16/2018 0938   BILIRUBINUR NEGATIVE 01/16/2018 0938   KETONESUR NEGATIVE 01/16/2018 0938   PROTEINUR NEGATIVE 01/16/2018 0938   UROBILINOGEN 0.2 08/23/2011 0807   NITRITE NEGATIVE 01/16/2018 0938   LEUKOCYTESUR NEGATIVE 01/16/2018 0938    No results found for: "LABMICR", "WBCUA", "RBCUA", "LABEPIT", "MUCUS", "BACTERIA"  Pertinent Imaging:  No results found for this or any previous visit.  No results found for this or any previous visit.  No results found for this or any previous visit.  No results found for this or any previous visit.  No results found for this or any previous visit.  No results found for this or any previous visit.  No results found for this or any previous visit.  No results found for this or any previous visit.   Assessment & Plan:    1. Prostate cancer ([place]) -RTC 6 months with PSA and possible prostate biopsy - Urinalysis, Routine w reflex microscopic   No follow-ups on file.  Nicolette Bang, MD  [facility] [place]

## 2021-09-10 ENCOUNTER — Ambulatory Visit: Payer: Medicare Other | Admitting: Family Medicine

## 2021-09-10 ENCOUNTER — Encounter (HOSPITAL_BASED_OUTPATIENT_CLINIC_OR_DEPARTMENT_OTHER): Payer: Medicare Other | Admitting: General Surgery

## 2021-09-10 DIAGNOSIS — E11621 Type 2 diabetes mellitus with foot ulcer: Secondary | ICD-10-CM | POA: Diagnosis not present

## 2021-09-10 NOTE — Progress Notes (Signed)
Jonathan Gilmore, Jonathan Gilmore ([id number]) Visit Report for 09/10/2021 Chief Complaint Document Details Patient Name: Date of Service: [facility] D. 09/10/2021 7:30 A M Medical Record Number: [id number] Patient Account Number: [id number] Date of Birth/Sex: Treating RN: [birth year] (71 y.o. Jonathan Gilmore Primary Care Provider: Merri Ray Other Clinician: Referring Provider: Treating Provider/Extender: Johnnette Barrios in Treatment: 1 Information Obtained from: Patient Chief Complaint Patient presents to the wound care center today with an open arterial ulcer Electronic Signature(s) Signed: 09/10/2021 8:20:51 AM By: Fredirick Maudlin MD FACS Entered By: Fredirick Maudlin on 09/10/2021 08:20:51 -------------------------------------------------------------------------------- Debridement Details Patient Name: Date of Service: Jonathan Gilmore, [name] 09/10/2021 7:30 A M Medical Record Number: [id number] Patient Account Number: [id number] Date of Birth/Sex: Treating RN: 21-Jul-1950 (71 y.o. Jonathan Gilmore Primary Care Provider: Merri Ray Other Clinician: Referring Provider: Treating Provider/Extender: Johnnette Barrios in Treatment: 1 Debridement Performed for Assessment: Wound #1 Left,Dorsal Foot Performed By: Physician Fredirick Maudlin, MD Debridement Type: Debridement Severity of Tissue Pre Debridement: Fat layer exposed Level of Consciousness (Pre-procedure): Awake and Alert Pre-procedure Verification/Time Out Yes - 08:05 Taken: Start Time: 08:07 Pain Control: Lidocaine 4% T opical Solution T Area Debrided (L x W): otal 0.7 (cm) x 0.6 (cm) = 0.42 (cm) Tissue and other material debrided: Viable, Non-Viable, Slough, Subcutaneous, Slough Level: Skin/Subcutaneous Tissue Debridement Description: Excisional Instrument: Curette Bleeding: Minimum Hemostasis Achieved: Pressure Procedural Pain: 0 Post Procedural Pain: 0 Response to Treatment:  Procedure was tolerated well Level of Consciousness (Post- Awake and Alert procedure): Post Debridement Measurements of Total Wound Length: (cm) 0.7 Width: (cm) 0.6 Depth: (cm) 0.1 Volume: (cm) 0.033 Character of Wound/Ulcer Post Debridement: Improved Severity of Tissue Post Debridement: Fat layer exposed Post Procedure Diagnosis Same as Pre-procedure Electronic Signature(s) Signed: 09/10/2021 9:54:55 AM By: Fredirick Maudlin MD FACS Signed: 09/10/2021 5:59:23 PM By: Baruch Gouty RN, BSN Entered By: Baruch Gouty on 09/10/2021 08:11:16 -------------------------------------------------------------------------------- HPI Details Patient Name: Date of Service: Jonathan Gilmore, [name] 09/10/2021 7:30 A M Medical Record Number: [id number] Patient Account Number: [id number] Date of Birth/Sex: Treating RN: 27-Dec-1950 (71 y.o. Jonathan Gilmore Primary Care Provider: Merri Ray Other Clinician: Referring Provider: Treating Provider/Extender: Johnnette Barrios in Treatment: 1 History of Present Illness HPI Description: ADMISSION 09/03/2021 This is a 71 year old male with a past medical history significant for poorly controlled type 2 diabetes mellitus (last hemoglobin A1c 7.9% on May 25, 2021), peripheral artery disease with multiple stenoses identified on a vascular study performed in 2019, multivessel coronary artery disease, congestive heart failure, neurogenic claudication, and hypertension. He presented to the emergency department in [place] on 2 June with a 1 week history of a wound on the top of his left foot. He was prescribed Bactrim and advised to clean the wound with Hibiclens and apply mupirocin to the site. He subsequently saw his primary care provider on June 8 who referred him to wound care. ABI in clinic today was 0.7. On the dorsum of his left foot, there is a circular wound with a clean base. The intake nurse removed some scabbing from the  surface. Periwound skin is intact. No significant slough accumulation. No erythema, induration, or odor. 09/10/2021: The wound is smaller and shallower today. The surface is still not super robust-looking, but improvement is definitely being made. Electronic Signature(s) Signed: 09/10/2021 8:21:22 AM By: Fredirick Maudlin MD FACS Entered By: Fredirick Maudlin on 09/10/2021 08:21:21 -------------------------------------------------------------------------------- Physical Exam Details Patient Name: Date of Service: Jonathan Gilmore,  [name] 09/10/2021 7:30 A M Medical Record Number: [id number] Patient Account Number: [id number] Date of Birth/Sex: Treating RN: Nov 17, 1950 (71 y.o. Jonathan Gilmore Primary Care Provider: Merri Ray Other Clinician: Referring Provider: Treating Provider/Extender: Johnnette Barrios in Treatment: 1 Constitutional Hypertensive, asymptomatic. . . . No acute distress.Marland Kitchen Respiratory Normal work of breathing on room air.. Notes 09/10/2021: The wound is smaller and shallower today. The surface is still not super robust-looking, but improvement is definitely being made. Electronic Signature(s) Signed: 09/10/2021 8:22:01 AM By: Fredirick Maudlin MD FACS Entered By: Fredirick Maudlin on 09/10/2021 08:22:01 -------------------------------------------------------------------------------- Physician Orders Details Patient Name: Date of Service: Jonathan Gilmore, [name] 09/10/2021 7:30 A M Medical Record Number: [id number] Patient Account Number: [id number] Date of Birth/Sex: Treating RN: December 31, 1950 (71 y.o. Jonathan Gilmore Primary Care Provider: Merri Ray Other Clinician: Referring Provider: Treating Provider/Extender: Johnnette Barrios in Treatment: 1 Verbal / Phone Orders: No Diagnosis Coding ICD-10 Coding Code Description E11.621 Type 2 diabetes mellitus with foot ulcer I50.30 Unspecified diastolic (congestive) heart  failure I35.0 Nonrheumatic aortic (valve) stenosis E11.59 Type 2 diabetes mellitus with other circulatory complications X32.355 Non-pressure chronic ulcer of other part of left foot with unspecified severity I73.9 Peripheral vascular disease, unspecified Follow-up Appointments ppointment in 2 weeks. - Dr. Celine Ahr RM 1 with [facility] Return A Thursday 7/13 @ 07:30 am Bathing/ Shower/ Hygiene May shower and wash wound with soap and water. Additional Orders / Instructions Stop/Decrease Smoking Follow Nutritious Diet - increase protein intake Wound Treatment Wound #1 - Foot Wound Laterality: Dorsal, Left Topical: Mupirocin Ointment Every Other Day/30 Days Discharge Instructions: Apply Mupirocin (Bactroban) as instructed Prim Dressing: KerraCel Ag Gelling Fiber Dressing, 2x2 in (silver alginate) Every Other Day/30 Days ary Discharge Instructions: Apply silver alginate to wound bed as instructed Secondary Dressing: Bordered Gauze, 2x2 in Every Other Day/30 Days Discharge Instructions: Apply over primary dressing as directed. Patient Medications llergies: morphine A Notifications Medication Indication Start End prior to debridement 09/10/2021 lidocaine DOSE topical 4 % cream - cream topical Electronic Signature(s) Signed: 09/10/2021 8:22:10 AM By: Fredirick Maudlin MD FACS Entered By: Fredirick Maudlin on 09/10/2021 08:22:10 -------------------------------------------------------------------------------- Problem List Details Patient Name: Date of Service: Sher[facility]n [facility] D. 09/10/2021 7:30 A M Medical Record Number: [id number] Patient Account Number: [id number] Date of Birth/Sex: Treating RN: 06-05-1950 (71 y.o. Jonathan Gilmore Primary Care Provider: Merri Ray Other Clinician: Referring Provider: Treating Provider/Extender: Johnnette Barrios in Treatment: 1 Active Problems ICD-10 Encounter Code Description Active Date MDM Diagnosis E11.621 Type 2 diabetes  mellitus with foot ulcer 09/03/2021 No Yes I50.30 Unspecified diastolic (congestive) heart failure 09/03/2021 No Yes I35.0 Nonrheumatic aortic (valve) stenosis 09/03/2021 No Yes E11.59 Type 2 diabetes mellitus with other circulatory complications 09/18/2374 No Yes L97.529 Non-pressure chronic ulcer of other part of left foot with unspecified severity 09/03/2021 No Yes I73.9 Peripheral vascular disease, unspecified 09/03/2021 No Yes Inactive Problems Resolved Problems Electronic Signature(s) Signed: 09/10/2021 8:20:34 AM By: Fredirick Maudlin MD FACS Entered By: Fredirick Maudlin on 09/10/2021 08:20:34 -------------------------------------------------------------------------------- Progress Note Details Patient Name: Date of Service: Jonathan Gilmore, [name] 09/10/2021 7:30 A M Medical Record Number: [id number] Patient Account Number: [id number] Date of Birth/Sex: Treating RN: 1950-12-26 (71 y.o. Jonathan Gilmore Primary Care Provider: Merri Ray Other Clinician: Referring Provider: Treating Provider/Extender: Johnnette Barrios in Treatment: 1 Subjective Chief Complaint Information obtained from Patient Patient presents to the wound care center today with an open arterial ulcer History  of Present Illness (HPI) ADMISSION 09/03/2021 This is a 71 year old male with a past medical history significant for poorly controlled type 2 diabetes mellitus (last hemoglobin A1c 7.9% on May 25, 2021), peripheral artery disease with multiple stenoses identified on a vascular study performed in 2019, multivessel coronary artery disease, congestive heart failure, neurogenic claudication, and hypertension. He presented to the emergency department in [place] on 2 June with a 1 week history of a wound on the top of his left foot. He was prescribed Bactrim and advised to clean the wound with Hibiclens and apply mupirocin to the site. He subsequently saw his primary care provider on June 8 who  referred him to wound care. ABI in clinic today was 0.7. On the dorsum of his left foot, there is a circular wound with a clean base. The intake nurse removed some scabbing from the surface. Periwound skin is intact. No significant slough accumulation. No erythema, induration, or odor. 09/10/2021: The wound is smaller and shallower today. The surface is still not super robust-looking, but improvement is definitely being made. Patient History Information obtained from Patient, Chart. Family History Cancer - Mother, Diabetes - Siblings, No family history of Heart Disease, Hereditary Spherocytosis, Hypertension, Kidney Disease, Lung Disease, Seizures, Stroke, Thyroid Problems, Tuberculosis. Social History Current every day smoker - 1 ppd, Marital Status - Married, Alcohol Use - Never, Drug Use - Current History - marijuana, Caffeine Use - Daily - coffee. Medical History Eyes Denies history of Cataracts, Glaucoma, Optic Neuritis Ear/Nose/Mouth/Throat Denies history of Chronic sinus problems/congestion, Middle ear problems Respiratory Patient has history of Sleep Apnea Cardiovascular Patient has history of Congestive Heart Failure, Coronary Artery Disease, Hypertension, Peripheral Arterial Disease Endocrine Patient has history of Type II Diabetes Genitourinary Denies history of End Stage Renal Disease Integumentary (Skin) Denies history of History of Burn Oncologic Denies history of Received Chemotherapy, Received Radiation Psychiatric Denies history of Anorexia/bulimia, Confinement Anxiety Hospitalization/Surgery History - polypectomy. - esophageal dilatation. - cardiac cath. - anterior cervical fusion. - carpal tunnel release. - hemorrhoid surgery. - tonsilectomy. Medical A Surgical History Notes nd Ear/Nose/Mouth/Throat deaf left ear Cardiovascular pericarditis, carotid stenosis, hyperlipidemia Gastrointestinal colon polyps, esophagitis Genitourinary prostate  cancer Neurologic lumbar disc herniation, cervical spondylosis Oncologic prostate CA Objective Constitutional Hypertensive, asymptomatic. No acute distress.. Vitals Time Taken: 7:58 AM, Height: 66 in, Weight: 185 lbs, BMI: 29.9, Temperature: 98 F, Pulse: 73 bpm, Respiratory Rate: 18 breaths/min, Blood Pressure: 173/76 mmHg. General Notes: pt did not check blood sugar this week Respiratory Normal work of breathing on room air.. General Notes: 09/10/2021: The wound is smaller and shallower today. The surface is still not super robust-looking, but improvement is definitely being made. Integumentary (Hair, Skin) Wound #1 status is Open. Original cause of wound was Blister. The date acquired was: 08/13/2021. The wound has been in treatment 1 weeks. The wound is located on the Left,Dorsal Foot. The wound measures 0.7cm length x 0.6cm width x 0.1cm depth; 0.33cm^2 area and 0.033cm^3 volume. There is Fat Layer (Subcutaneous Tissue) exposed. There is no tunneling or undermining noted. There is a medium amount of serosanguineous drainage noted. The wound margin is flat and intact. There is large (67-100%) red granulation within the wound bed. There is a small (1-33%) amount of necrotic tissue within the wound bed including Adherent Slough. Assessment Active Problems ICD-10 Type 2 diabetes mellitus with foot ulcer Unspecified diastolic (congestive) heart failure Nonrheumatic aortic (valve) stenosis Type 2 diabetes mellitus with other circulatory complications Non-pressure chronic ulcer of other part of left  foot with unspecified severity Peripheral vascular disease, unspecified Procedures Wound #1 Pre-procedure diagnosis of Wound #1 is a Diabetic Wound/Ulcer of the Lower Extremity located on the Left,Dorsal Foot .Severity of Tissue Pre Debridement is: Fat layer exposed. There was a Excisional Skin/Subcutaneous Tissue Debridement with a total area of 0.42 sq cm performed by Fredirick Maudlin,  MD. With the following instrument(s): Curette to remove Viable and Non-Viable tissue/material. Material removed includes Subcutaneous Tissue and Slough and after achieving pain control using Lidocaine 4% Topical Solution. No specimens were taken. A time out was conducted at 08:05, prior to the start of the procedure. A Minimum amount of bleeding was controlled with Pressure. The procedure was tolerated well with a pain level of 0 throughout and a pain level of 0 following the procedure. Post Debridement Measurements: 0.7cm length x 0.6cm width x 0.1cm depth; 0.033cm^3 volume. Character of Wound/Ulcer Post Debridement is improved. Severity of Tissue Post Debridement is: Fat layer exposed. Post procedure Diagnosis Wound #1: Same as Pre-Procedure Plan Follow-up Appointments: Return Appointment in 2 weeks. - Dr. Celine Ahr RM 1 with [facility] Thursday 7/13 @ 07:30 am Bathing/ Shower/ Hygiene: May shower and wash wound with soap and water. Additional Orders / Instructions: Stop/Decrease Smoking Follow Nutritious Diet - increase protein intake The following medication(s) was prescribed: lidocaine topical 4 % cream cream topical for prior to debridement was prescribed at facility WOUND #1: - Foot Wound Laterality: Dorsal, Left Topical: Mupirocin Ointment Every Other Day/30 Days Discharge Instructions: Apply Mupirocin (Bactroban) as instructed Prim Dressing: KerraCel Ag Gelling Fiber Dressing, 2x2 in (silver alginate) Every Other Day/30 Days ary Discharge Instructions: Apply silver alginate to wound bed as instructed Secondary Dressing: Bordered Gauze, 2x2 in Every Other Day/30 Days Discharge Instructions: Apply over primary dressing as directed. 09/10/2021: The wound is smaller and shallower today. The surface is still not super robust-looking, but improvement is definitely being made. I used a curette to debride slough and nonviable subcutaneous tissue from the wound. We will continue using mupirocin and  silver alginate. He will follow-up in 2 weeks. Electronic Signature(s) Signed: 09/10/2021 8:24:34 AM By: Fredirick Maudlin MD FACS Entered By: Fredirick Maudlin on 09/10/2021 08:24:34 -------------------------------------------------------------------------------- HxROS Details Patient Name: Date of Service: [place], [name] 09/10/2021 7:30 A M Medical Record Number: [id number] Patient Account Number: [id number] Date of Birth/Sex: Treating RN: 06/22/1950 (71 y.o. Jonathan Gilmore Primary Care Provider: Merri Ray Other Clinician: Referring Provider: Treating Provider/Extender: Johnnette Barrios in Treatment: 1 Information Obtained From Patient Chart Eyes Medical History: Negative for: Cataracts; Glaucoma; Optic Neuritis Ear/Nose/Mouth/Throat Medical History: Negative for: Chronic sinus problems/congestion; Middle ear problems Past Medical History Notes: deaf left ear Respiratory Medical History: Positive for: Sleep Apnea Cardiovascular Medical History: Positive for: Congestive Heart Failure; Coronary Artery Disease; Hypertension; Peripheral Arterial Disease Past Medical History Notes: pericarditis, carotid stenosis, hyperlipidemia Gastrointestinal Medical History: Past Medical History Notes: colon polyps, esophagitis Endocrine Medical History: Positive for: Type II Diabetes Time with diabetes: since 1995 Treated with: Oral agents Blood sugar tested every day: No Genitourinary Medical History: Negative for: End Stage Renal Disease Past Medical History Notes: prostate cancer Integumentary (Skin) Medical History: Negative for: History of Burn Neurologic Medical History: Past Medical History Notes: lumbar disc herniation, cervical spondylosis Oncologic Medical History: Negative for: Received Chemotherapy; Received Radiation Past Medical History Notes: prostate CA Psychiatric Medical History: Negative for: Anorexia/bulimia;  Confinement Anxiety Immunizations Pneumococcal Vaccine: Received Pneumococcal Vaccination: Yes Received Pneumococcal Vaccination On or After 60th Birthday: Yes Implantable Devices No  devices added Hospitalization / Surgery History Type of Hospitalization/Surgery polypectomy esophageal dilatation cardiac cath anterior cervical fusion carpal tunnel release hemorrhoid surgery tonsilectomy Family and Social History Cancer: Yes - Mother; Diabetes: Yes - Siblings; Heart Disease: No; Hereditary Spherocytosis: No; Hypertension: No; Kidney Disease: No; Lung Disease: No; Seizures: No; Stroke: No; Thyroid Problems: No; Tuberculosis: No; Current every day smoker - 1 ppd; Marital Status - Married; Alcohol Use: Never; Drug Use: Current History - marijuana; Caffeine Use: Daily - coffee; Financial Concerns: No; Food, Clothing or Shelter Needs: No; Support System Lacking: No; Transportation Concerns: No Electronic Signature(s) Signed: 09/10/2021 9:54:55 AM By: Fredirick Maudlin MD FACS Signed: 09/10/2021 5:59:23 PM By: Baruch Gouty RN, BSN Entered By: Fredirick Maudlin on 09/10/2021 08:21:30 -------------------------------------------------------------------------------- SuperBill Details Patient Name: Date of Service: Jonathan Gilmore, [name] 09/10/2021 Medical Record Number: [id number] Patient Account Number: [id number] Date of Birth/Sex: Treating RN: 01/03/1951 (71 y.o. Jonathan Gilmore Primary Care Provider: Merri Ray Other Clinician: Referring Provider: Treating Provider/Extender: Johnnette Barrios in Treatment: 1 Diagnosis Coding ICD-10 Codes Code Description [phone] Type 2 diabetes mellitus with foot ulcer I50.30 Unspecified diastolic (congestive) heart failure I35.0 Nonrheumatic aortic (valve) stenosis E11.59 Type 2 diabetes mellitus with other circulatory complications J62.831 Non-pressure chronic ulcer of other part of left foot with unspecified  severity I73.9 Peripheral vascular disease, unspecified Facility Procedures CPT4 Code: [id number] Description: 37106 - DEB SUBQ TISSUE 20 SQ CM/< ICD-10 Diagnosis Description L97.529 Non-pressure chronic ulcer of other part of left foot with unspecified severit Modifier: y Quantity: 1 Physician Procedures : CPT4 Code Description Modifier [id number] - WC PHYS LEVEL 3 - EST PT 25 ICD-10 Diagnosis Description E11.621 Type 2 diabetes mellitus with foot ulcer L97.529 Non-pressure chronic ulcer of other part of left foot with unspecified severity E11.59 Type  2 diabetes mellitus with other circulatory complications O27.0 Peripheral vascular disease, unspecified Quantity: 1 : [id number] 18299 - WC PHYS SUBQ TISS 20 SQ CM ICD-10 Diagnosis Description L97.529 Non-pressure chronic ulcer of other part of left foot with unspecified severity Quantity: 1 Electronic Signature(s) Signed: 09/10/2021 8:25:28 AM By: Fredirick Maudlin MD FACS Entered By: Fredirick Maudlin on 09/10/2021 37:16:96

## 2021-09-10 NOTE — Progress Notes (Addendum)
Jonathan, Gilmore ([id number]) Visit Report for 09/10/2021 Arrival Information Details Patient Name: Date of Service: [name] D. 09/10/2021 7:30 A M Medical Record Number: [id number] Patient Account Number: [id number] Date of Birth/Sex: Treating RN: Jan 02, 1951 (71 y.o. Jonathan Gilmore, Vaughan Basta Primary Care Payden Bonus: Merri Ray Other Clinician: Referring Ervin Hensley: Treating Deyton Ellenbecker/Extender: Johnnette Barrios in Treatment: 1 Visit Information History Since Last Visit Added or deleted any medications: No Patient Arrived: Ambulatory Any new allergies or adverse reactions: No Arrival Time: 07:57 Had a fall or experienced change in No Accompanied By: self activities of daily living that may affect Transfer Assistance: None risk of falls: Patient Identification Verified: Yes Signs or symptoms of abuse/neglect since last visito No Secondary Verification Process Completed: Yes Hospitalized since last visit: No Patient Requires Transmission-Based Precautions: No Implantable device outside of the clinic excluding No Patient Has Alerts: No cellular tissue based products placed in the center since last visit: Has Dressing in Place as Prescribed: Yes Pain Present Now: No Electronic Signature(s) Signed: 09/10/2021 5:59:23 PM By: Baruch Gouty RN, BSN Entered By: Baruch Gouty on 09/10/2021 07:58:05 -------------------------------------------------------------------------------- Encounter Discharge Information Details Patient Name: Date of Service: Jonathan Gilmore, [name] 09/10/2021 7:30 A M Medical Record Number: [id number] Patient Account Number: [id number] Date of Birth/Sex: Treating RN: 04-21-1950 (71 y.o. Jonathan Gilmore Primary Care Declan Mier: Merri Ray Other Clinician: Referring Kollin Udell: Treating Elizette Shek/Extender: Johnnette Barrios in Treatment: 1 Encounter Discharge Information Items Post Procedure Vitals Discharge Condition:  Stable Temperature (F): 98 Ambulatory Status: Ambulatory Pulse (bpm): 73 Discharge Destination: Home Respiratory Rate (breaths/min): 18 Transportation: Private Auto Blood Pressure (mmHg): 173/76 Accompanied By: self Schedule Follow-up Appointment: Yes Clinical Summary of Care: Patient Declined Electronic Signature(s) Signed: 09/10/2021 5:59:23 PM By: Baruch Gouty RN, BSN Entered By: Baruch Gouty on 09/10/2021 15:32:33 -------------------------------------------------------------------------------- Lower Extremity Assessment Details Patient Name: Date of Service: Jonathan Gilmore, [name] 09/10/2021 7:30 A M Medical Record Number: [id number] Patient Account Number: [id number] Date of Birth/Sex: Treating RN: 1950/11/29 (71 y.o. Jonathan Gilmore Primary Care Lacresia Darwish: Merri Ray Other Clinician: Referring Zaccheus Edmister: Treating Kristy Catoe/Extender: Johnnette Barrios in Treatment: 1 Edema Assessment Assessed: [Left: No] [Right: No] Edema: [Left: N] [Right: o] Calf Left: Right: Point of Measurement: From Medial Instep 36.5 cm Ankle Left: Right: Point of Measurement: From Medial Instep 21.5 cm Vascular Assessment Pulses: Dorsalis Pedis Palpable: [Left:Yes] Electronic Signature(s) Signed: 09/10/2021 5:59:23 PM By: Baruch Gouty RN, BSN Entered By: Baruch Gouty on 09/10/2021 08:01:13 -------------------------------------------------------------------------------- Multi Wound Chart Details Patient Name: Date of Service: Jonathan Gilmore, [name] 09/10/2021 7:30 A M Medical Record Number: [id number] Patient Account Number: [id number] Date of Birth/Sex: Treating RN: Feb 10, 1951 (71 y.o. Jonathan Gilmore Primary Care Whalen Trompeter: Merri Ray Other Clinician: Referring Basya Casavant: Treating Jamesmichael Shadd/Extender: Johnnette Barrios in Treatment: 1 Vital Signs Height(in): 83 Pulse(bpm): 65 Weight(lbs): 185 Blood Pressure(mmHg): 173/76 Body  Mass Index(BMI): 29.9 Temperature(F): 98 Respiratory Rate(breaths/min): 18 Photos: [1:Left, Dorsal Foot] [N/A:N/A N/A] Wound Location: [1:Blister] [N/A:N/A] Wounding Event: [1:Diabetic Wound/Ulcer of the Lower] [N/A:N/A] Primary Etiology: [1:Extremity Sleep Apnea, Congestive Heart] [N/A:N/A] Comorbid History: [1:Failure, Coronary Artery Disease, Hypertension, Peripheral Arterial Disease, Type II Diabetes 08/13/2021] [N/A:N/A] Date Acquired: [1:1] [N/A:N/A] Weeks of Treatment: [1:Open] [N/A:N/A] Wound Status: [1:No] [N/A:N/A] Wound Recurrence: [1:0.7x0.6x0.1] [N/A:N/A] Measurements L x W x D (cm) [1:0.33] [N/A:N/A] A (cm) : rea [1:0.033] [N/A:N/A] Volume (cm) : [1:57.60%] [N/A:N/A] % Reduction in A [1:rea: 57.70%] [N/A:N/A] % Reduction in Volume: [1:Grade 1] [N/A:N/A]  Classification: [1:Medium] [N/A:N/A] Exudate A mount: [1:Serosanguineous] [N/A:N/A] Exudate Type: [1:red, brown] [N/A:N/A] Exudate Color: [1:Flat and Intact] [N/A:N/A] Wound Margin: [1:Large (67-100%)] [N/A:N/A] Granulation A mount: [1:Red] [N/A:N/A] Granulation Quality: [1:Small (1-33%)] [N/A:N/A] Necrotic A mount: [1:Fat Layer (Subcutaneous Tissue): Yes N/A] Exposed Structures: [1:Fascia: No Tendon: No Muscle: No Joint: No Bone: No Small (1-33%)] [N/A:N/A] Epithelialization: [1:Debridement - Excisional] [N/A:N/A] Debridement: Pre-procedure Verification/Time Out 08:05 [N/A:N/A] Taken: [1:Lidocaine 4% Topical Solution] [N/A:N/A] Pain Control: [1:Subcutaneous, Slough] [N/A:N/A] Tissue Debrided: [1:Skin/Subcutaneous Tissue] [N/A:N/A] Level: [1:0.42] [N/A:N/A] Debridement A (sq cm): [1:rea Curette] [N/A:N/A] Instrument: [1:Minimum] [N/A:N/A] Bleeding: [1:Pressure] [N/A:N/A] Hemostasis A chieved: [1:0] [N/A:N/A] Procedural Pain: [1:0] [N/A:N/A] Post Procedural Pain: [1:Procedure was tolerated well] [N/A:N/A] Debridement Treatment Response: [1:0.7x0.6x0.1] [N/A:N/A] Post Debridement Measurements L x W x D (cm)  [1:0.033] [N/A:N/A] Post Debridement Volume: (cm) [1:Debridement] [N/A:N/A] Treatment Notes Electronic Signature(s) Signed: 09/10/2021 8:20:44 AM By: Fredirick Maudlin MD FACS Signed: 09/10/2021 5:59:23 PM By: Baruch Gouty RN, BSN Entered By: Fredirick Maudlin on 09/10/2021 08:20:44 -------------------------------------------------------------------------------- Multi-Disciplinary Care Plan Details Patient Name: Date of Service: Jonathan Gilmore, [name] 09/10/2021 7:30 A M Medical Record Number: [id number] Patient Account Number: [id number] Date of Birth/Sex: Treating RN: 1950/10/06 (71 y.o. Jonathan Gilmore Primary Care [place] Latulippe: Merri Ray Other Clinician: Referring Aloria Looper: Treating Jerline Linzy/Extender: Johnnette Barrios in Treatment: 1 Cedar reviewed with physician Active Inactive Electronic Signature(s) Signed: 10/20/2021 6:14:09 PM By: Baruch Gouty RN, BSN Previous Signature: 09/10/2021 5:59:23 PM Version By: Baruch Gouty RN, BSN Entered By: Baruch Gouty on 10/20/2021 10:49:08 -------------------------------------------------------------------------------- Pain Assessment Details Patient Name: Date of Service: Jonathan Gilmore, [name] 09/10/2021 7:30 A M Medical Record Number: [id number] Patient Account Number: [id number] Date of Birth/Sex: Treating RN: June 15, 1950 (71 y.o. Jonathan Gilmore Primary Care Jessee Mezera: Merri Ray Other Clinician: Referring Mckay Tegtmeyer: Treating Colan Laymon/Extender: Johnnette Barrios in Treatment: 1 Active Problems Location of Pain Severity and Description of Pain Patient Has Paino No Site Locations Rate the pain. Current Pain Level: 0 Pain Management and Medication Current Pain Management: Electronic Signature(s) Signed: 09/10/2021 5:59:23 PM By: Baruch Gouty RN, BSN Entered By: Baruch Gouty on 09/10/2021  07:59:06 -------------------------------------------------------------------------------- Patient/Caregiver Education Details Patient Name: Date of Service: Sherilyn Dacosta D. 6/29/2023andnbsp7:30 A M Medical Record Number: [id number] Patient Account Number: [id number] Date of Birth/Gender: Treating RN: Nov 24, 1950 (71 y.o. Jonathan Gilmore Primary Care Physician: Merri Ray Other Clinician: Referring Physician: Treating Physician/Extender: Johnnette Barrios in Treatment: 1 Education Assessment Education Provided To: Patient Education Topics Provided Elevated Blood Sugar/ Impact on Healing: Methods: Explain/Verbal Responses: Reinforcements needed, State content correctly Wound/Skin Impairment: Methods: Explain/Verbal Responses: Reinforcements needed, State content correctly Electronic Signature(s) Signed: 09/10/2021 5:59:23 PM By: Baruch Gouty RN, BSN Entered By: Baruch Gouty on 09/10/2021 08:06:57 -------------------------------------------------------------------------------- Wound Assessment Details Patient Name: Date of Service: Jonathan Gilmore, [name] 09/10/2021 7:30 A M Medical Record Number: [id number] Patient Account Number: [id number] Date of Birth/Sex: Treating RN: May 10, 1950 (71 y.o. Jonathan Gilmore Primary Care Miasia Crabtree: Merri Ray Other Clinician: Referring Jaaziel Peatross: Treating Kam Rahimi/Extender: Johnnette Barrios in Treatment: 1 Wound Status Wound Number: 1 Primary Diabetic Wound/Ulcer of the Lower Extremity Etiology: Wound Location: Left, Dorsal Foot Wound Open Wounding Event: Blister Status: Date Acquired: 08/13/2021 Comorbid Sleep Apnea, Congestive Heart Failure, Coronary Artery Disease, Weeks Of Treatment: 1 History: Hypertension, Peripheral Arterial Disease, Type II Diabetes Clustered Wound: No Photos Wound Measurements Length: (cm) 0.7 Width: (cm) 0.6 Depth: (cm) 0.1 Area: (cm)  0.33 Volume: (cm)  0.033 % Reduction in Area: 57.6% % Reduction in Volume: 57.7% Epithelialization: Small (1-33%) Tunneling: No Undermining: No Wound Description Classification: Grade 1 Wound Margin: Flat and Intact Exudate Amount: Medium Exudate Type: Serosanguineous Exudate Color: red, brown Foul Odor After Cleansing: No Slough/Fibrino No Wound Bed Granulation Amount: Large (67-100%) Exposed Structure Granulation Quality: Red Fascia Exposed: No Necrotic Amount: Small (1-33%) Fat Layer (Subcutaneous Tissue) Exposed: Yes Necrotic Quality: Adherent Slough Tendon Exposed: No Muscle Exposed: No Joint Exposed: No Bone Exposed: No Electronic Signature(s) Signed: 09/10/2021 5:59:23 PM By: Baruch Gouty RN, BSN Entered By: Baruch Gouty on 09/10/2021 08:05:31 -------------------------------------------------------------------------------- Vitals Details Patient Name: Date of Service: Jonathan Gilmore, [name] 09/10/2021 7:30 A M Medical Record Number: [id number] Patient Account Number: [id number] Date of Birth/Sex: Treating RN: 1950/09/09 (71 y.o. Jonathan Gilmore Primary Care Elisandra Deshmukh: Merri Ray Other Clinician: Referring Fiana Gladu: Treating Lenton Gendreau/Extender: Johnnette Barrios in Treatment: 1 Vital Signs Time Taken: 07:58 Temperature (F): 98 Height (in): 66 Pulse (bpm): 73 Weight (lbs): 185 Respiratory Rate (breaths/min): 18 Body Mass Index (BMI): 29.9 Blood Pressure (mmHg): 173/76 Reference Range: 80 - 120 mg / dl Notes pt did not check blood sugar this week Electronic Signature(s) Signed: 09/10/2021 5:59:23 PM By: Baruch Gouty RN, BSN Entered By: Baruch Gouty on 09/10/2021 07:58:58

## 2021-09-24 ENCOUNTER — Ambulatory Visit (HOSPITAL_BASED_OUTPATIENT_CLINIC_OR_DEPARTMENT_OTHER): Payer: Medicare Other | Admitting: General Surgery

## 2021-10-01 ENCOUNTER — Ambulatory Visit: Payer: Medicare Other | Admitting: Family Medicine

## 2021-10-12 ENCOUNTER — Other Ambulatory Visit: Payer: Self-pay | Admitting: Family Medicine

## 2021-10-12 DIAGNOSIS — E1159 Type 2 diabetes mellitus with other circulatory complications: Secondary | ICD-10-CM

## 2021-10-15 ENCOUNTER — Ambulatory Visit: Payer: Medicare Other | Admitting: Cardiology

## 2021-10-15 ENCOUNTER — Encounter: Payer: Self-pay | Admitting: Cardiology

## 2021-10-15 VITALS — BP 130/50 | HR 64 | Ht 66.0 in | Wt 188.8 lb

## 2021-10-15 DIAGNOSIS — I10 Essential (primary) hypertension: Secondary | ICD-10-CM

## 2021-10-15 DIAGNOSIS — I350 Nonrheumatic aortic (valve) stenosis: Secondary | ICD-10-CM

## 2021-10-15 DIAGNOSIS — I2510 Atherosclerotic heart disease of native coronary artery without angina pectoris: Secondary | ICD-10-CM | POA: Diagnosis not present

## 2021-10-15 DIAGNOSIS — I5032 Chronic diastolic (congestive) heart failure: Secondary | ICD-10-CM

## 2021-10-15 DIAGNOSIS — E782 Mixed hyperlipidemia: Secondary | ICD-10-CM

## 2021-10-15 MED ORDER — NITROGLYCERIN 0.4 MG SL SUBL: 0.4000 mg | SUBLINGUAL_TABLET | SUBLINGUAL | 3 refills | Status: DC | PRN

## 2021-10-15 NOTE — Patient Instructions (Addendum)
Medication Instructions:  Continue all current medications.   Labwork: none  Testing/Procedures: none  Follow-Up: 6 months   Any Other Special Instructions Will Be Listed Below (If Applicable).   If you need a refill on your cardiac medications before your next appointment, please call your pharmacy.  

## 2021-10-15 NOTE — Progress Notes (Signed)
Clinical Summary Jonathan Gilmore is a 71 y.o.male seen today for follow up of the following medical problems.      1. CAD - CT scan chest 02/2013 with 3 vessel CAD, severe LAD disease. - Jan 2015 nuclear stress without clear ischemia - admit 07/2015 with acute pulmonary edema in setting of severe HTN. Referred for cath.  - cath 07/2015 with 50% LAD disease, overall patent coronaries - his presentation with pulmonary edema likely secondary to severe HTN in setting of restrictive diastolic dysfunction. - echo 07/2015 LVEF 45-50%, restrictive diastolic dysfunction 0/1779 echo LVEF 60-65%   - recent chest pains, some SOB - started 2 days ago. Pressure epigastric/midchest, 3/10 in severity. Some associated SOB few seconds.. Lasted a few hours constant, not positional. No association with food or eating.  - no repeat episodes -compliant with meds.     02/2021 nuclear stress: small inferior infarct, no current ischemia No recent chest pain   2. Severe OSA - not using CPAP machine due to discomfort. Stopped several years ago     3. Hyperlipidemia -11/2018 TC 82 HDL 25 TG 137 LDL 35 - he is compliant with statin   - 08/2019 TC 106 HDL 37 TG 100 LDL 51  - 04/2020 TC 103 HDL 35 TG 143 LDL 45 - 11/2020 TC 87 HDL 35 LDL 38 TG 69   4. Aortic stenosis - echo 07/2015 mild to moderate. Mean gradient 16, AVA reported at 0.9 with dimensionless index 0.17 Mar 2018 echo LVEF 60-65%, mild to moderate stenosis   08/2020 echo LVEF 60-65%, no WMAs, grade I dd, mild MR, mild AI, mild to mod AS mean grade 17, AVA VTI 1.12 DI 0.29 - no recent symptoms.    5. HTN - compliant withmeds     6. Chronic diastolic HF - no recent edema   7. DM2  - followed by pcp     8. Claudication/PAD - followed by vascular   9. Dysphagia - episode of food getting stuck, reports prior issues along with esophageal dilatation in the past. Asking to reestablish with GI, reports also due for colonscopy    10.Neuropathy  11. Prostate cancer - followed by urology   AAA screen: 2017 [place] was normal   SH: his wife, Jonathan Gilmore is also a patient of mine. 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    Past Medical History:  Diagnosis Date   Abnormal cardiovascular stress test 03/2013   Anxiety    Arthritis    Back pain    CHF (congestive heart failure) (HCC)    Coronary artery disease    Multivessel with significant LAD involvement by chest CT 2014   Depression    Diastolic dysfunction    Essential hypertension    Lumbar herniated disc    L4-L5   MRSA (methicillin resistant staph aureus) culture positive    Neuropathy    Shortness of breath dyspnea    Sleep apnea    No cpap   Type 2 diabetes mellitus (HCC)      Allergies  Allergen Reactions   Morphine And Related Nausea And Vomiting     Current Outpatient Medications  Medication Sig Dispense Refill   ACCU-CHEK AVIVA PLUS test strip USE AS INSTRUCTED 100 strip 3   aspirin EC 81 MG tablet Take 81 mg by mouth daily.     atorvastatin (LIPITOR) 80 MG tablet  TAKE 1 TABLET BY MOUTH EVERY DAY 90 tablet 1   Calcium-Magnesium-Zinc (CAL-MAG-ZINC PO) Take 1 tablet by mouth daily.     chlorhexidine (HIBICLENS) 4 % external liquid Apply topically daily as needed. 120 mL 0   Flaxseed, Linseed, (FLAXSEED OIL) 1000 MG CAPS Take 1,000 mg by mouth daily.     furosemide (LASIX) 40 MG tablet Take 1 tablet (40 mg total) by mouth daily. 90 tablet 3   gabapentin (NEURONTIN) 300 MG capsule TAKE 1 CAPSULE TWICE A DAY 180 capsule 3   lisinopril (ZESTRIL) 20 MG tablet Take 1 tablet (20 mg total) by mouth daily. 90 tablet 3   magnesium oxide (MAG-OX) 400 MG tablet Take 400 mg by mouth at bedtime.     metFORMIN (GLUCOPHAGE) 500 MG tablet TAKE 2 TABLETS BY MOUTH TWICE  DAILY 360 tablet 3   metoprolol succinate (TOPROL-XL) 25 MG 24 hr tablet TAKE 1 TABLET BY MOUTH  EVERYDAY AT BEDTIME 90 tablet 3   Multiple Vitamin (MULTIVITAMIN WITH MINERALS) TABS Take 1 tablet by mouth daily.     mupirocin ointment (BACTROBAN) 2 % Apply 1 application. topically 2 (two) times daily. 22 g 0   nitroGLYCERIN (NITROSTAT) 0.4 MG SL tablet Place 1 tablet (0.4 mg total) under the tongue every 5 (five) minutes x 3 doses as needed for chest pain (if no relief after 2nd dose, proceed to ED for an evaluation). 25 tablet 2   omeprazole (PRILOSEC) 40 MG capsule Take 1 capsule (40 mg total) by mouth daily. 90 capsule 3   sildenafil (VIAGRA) 100 MG tablet 1/2-1 p.o. as needed 20 tablet 99   testosterone cypionate (DEPOTESTOSTERONE CYPIONATE) 200 MG/ML injection Inject 0.5 mLs (100 mg total) into the muscle 2 (two) times a week. 10 mL 2   varenicline (CHANTIX CONTINUING MONTH PAK) 1 MG tablet Take 1 tablet (1 mg total) by mouth 2 (two) times daily. 60 tablet 2   Varenicline Tartrate, Starter, (CHANTIX STARTING MONTH PAK) 0.5 MG X 11 & 1 MG X 42 TBPK Take one 0.5 mg tablet by mouth once daily for 3 days, then increase to one 0.5 mg tablet twice daily for 4 days, then increase to one 1 mg tablet twice daily. 40 each 0   No current facility-administered medications for this visit.     Past Surgical History:  Procedure Laterality Date   ANTERIOR CERVICAL DECOMP/DISCECTOMY FUSION  01/18/2011   Procedure: ANTERIOR CERVICAL DECOMPRESSION/DISCECTOMY FUSION 2 LEVELS;  Surgeon: Cooper Render Pool;  Location: [facility];  Service: Neurosurgery;  Laterality: N/A;  anterior cervical discectomy and fusion with allograft and plating, cervical five-six, cervical six-seven   BIOPSY N/A 04/04/2015   Procedure: BIOPSY;  Surgeon: Rogene Houston, MD;  Location: [facility];  Service: Endoscopy;  Laterality: N/A;   BIOPSY  11/09/2019   Procedure: BIOPSY;  Surgeon: Harvel Quale, MD;  Location: [facility];  Service: Gastroenterology;;   Bone spur removed  1997   Left shoulder   CARDIAC  CATHETERIZATION N/A 08/08/2015   Procedure: Right/Left Heart Cath and Coronary Angiography;  Surgeon: Sherren Mocha, MD;  Location: [facility];  Service: Cardiovascular;  Laterality: N/A;   CARPAL TUNNEL RELEASE Right    COLONOSCOPY WITH PROPOFOL N/A 11/09/2019   Procedure: COLONOSCOPY WITH PROPOFOL;  Surgeon: Harvel Quale, MD;  Location: [facility];  Service: Gastroenterology;  Laterality: N/A;  900   COLONOSCOPY WITH PROPOFOL N/A 02/27/2021   Procedure: COLONOSCOPY WITH PROPOFOL;  Surgeon: Montez Morita,  Quillian Quince, MD;  Location: [facility];  Service: Gastroenterology;  Laterality: N/A;  7:30   ESOPHAGEAL DILATION N/A 04/04/2015   Procedure: ESOPHAGEAL DILATION;  Surgeon: Rogene Houston, MD;  [facility];  Service: Endoscopy;  Laterality: N/A;   ESOPHAGEAL DILATION N/A 11/09/2019   Procedure: ESOPHAGEAL DILATION;  Surgeon: Harvel Quale, MD;  [facility];  Service: Gastroenterology;  Laterality: N/A;   ESOPHAGOGASTRODUODENOSCOPY N/A 04/04/2015   Procedure: ESOPHAGOGASTRODUODENOSCOPY (EGD);  Surgeon: Rogene Houston, MD;  [facility];  Service: Endoscopy;  Laterality: N/A;  1240   ESOPHAGOGASTRODUODENOSCOPY (EGD) WITH PROPOFOL N/A 11/09/2019   Procedure: ESOPHAGOGASTRODUODENOSCOPY (EGD) WITH PROPOFOL;  Surgeon: Harvel Quale, MD;  [facility];  Service: Gastroenterology;  Laterality: N/A;   HEMORRHOID SURGERY     POLYPECTOMY  11/09/2019   Procedure: POLYPECTOMY;  Surgeon: Harvel Quale, MD;  [facility];  Service: Gastroenterology;;   POLYPECTOMY  02/27/2021   Procedure: POLYPECTOMY;  Surgeon: Montez Morita, Quillian Quince, MD;  [facility]: [facility];  Service: Gastroenterology;;   TONSILLECTOMY       Allergies  Allergen Reactions   Morphine And Related Nausea And Vomiting      Family History  Problem Relation Age of Onset   Lung cancer Mother    Diabetes Father     Hepatitis B Father    Diabetes Sister    Cushing syndrome Sister      Social History Mr. Oberhaus reports that he has been smoking cigarettes. He started smoking about 53 years ago. He has a 50.00 pack-year smoking history. He has quit using smokeless tobacco. Mr. [name] reports current alcohol use of about 1.0 standard drink of alcohol per week.   Review of Systems CONSTITUTIONAL: No weight loss, fever, chills, weakness or fatigue.  HEENT: Eyes: No visual loss, blurred vision, double vision or yellow sclerae.No hearing loss, sneezing, congestion, runny nose or sore throat.  SKIN: No rash or itching.  CARDIOVASCULAR: per hpi RESPIRATORY: No shortness of breath, cough or sputum.  GASTROINTESTINAL: No anorexia, nausea, vomiting or diarrhea. No abdominal pain or blood.  GENITOURINARY: No burning on urination, no polyuria NEUROLOGICAL: No headache, dizziness, syncope, paralysis, ataxia, numbness or tingling in the extremities. No change in bowel or bladder control.  MUSCULOSKELETAL: No muscle, back pain, joint pain or stiffness.  LYMPHATICS: No enlarged nodes. No history of splenectomy.  PSYCHIATRIC: No history of depression or anxiety.  ENDOCRINOLOGIC: No reports of sweating, cold or heat intolerance. No polyuria or polydipsia.  Marland Kitchen   Physical Examination Today's Vitals   10/15/21 1049  BP: (!) 130/50  Pulse: 64  SpO2: 97%  Weight: 188 lb 12.8 oz (85.6 kg)  Height: '5\' 6"'$  (1.676 m)   Body mass index is 30.47 kg/m.  Gen: resting comfortably, no acute distress HEENT: no scleral icterus, pupils equal round and reactive, no palptable cervical adenopathy,  CV: RRR, no m/r/g no jvd Resp: Clear to auscultation bilaterally GI: abdomen is soft, non-tender, non-distended, normal bowel sounds, no hepatosplenomegaly MSK: extremities are warm, no edema.  Skin: warm, no rash Neuro:  no focal deficits Psych: appropriate affect   Diagnostic Studies Jan 2015 MPI IMPRESSION: 1.  Abnormal  exercise myocardial perfusion imaging stress test   2. Inferior wall defect likely due to subdiaphragmatic attenuation, its wall motion is similar to other areas without a perfusion defect.   3. Likely small anteroseptal infract, this area is more hypokinetic than the rest of the myocardium  3.  Low left ventricular systolic function with global hypokinesis   4. Mildly reduced functional capacity (90% of age and gender predicted)   5. Overall increased risk for major cardiac events based on low ejection fraction. There is no current myocardium at jeopardy.     02/2013 echo Study Conclusions  - Left ventricle: The cavity size was normal. There was mild   concentric hypertrophy. Systolic function was normal. The   estimated ejection fraction was in the range of 55% to   60%. Wall motion was normal; there were no regional wall   motion abnormalities. There is mildly asynchronous   contraction consistent with intraventricular conduction   delay (IVCD) or bundle Danta Baumgardner block. Doppler parameters   are consistent with abnormal left ventricular relaxation   (grade 1 diastolic dysfunction). - Aortic valve: Cusp separation was mildly reduced. There   was mild to moderate stenosis. - Left atrium: The atrium was moderately dilated.   07/2015 echo   Study Conclusions   - Left ventricle: The cavity size was normal. Wall thickness was   increased in a pattern of mild LVH. Systolic function was mildly   reduced. The estimated ejection fraction was in the range of 45%   to 50%. Diffuse hypokinesis. Doppler parameters are consistent   with restrictive physiology, indicative of decreased left   ventricular diastolic compliance and/or increased left atrial   pressure. Doppler parameters are consistent with high ventricular   filling pressure. - Aortic valve: Valve mobility was restricted. There was mild to   moderate stenosis. Valve area (VTI): 0.96 cm^2. Valve area   (Vmax): 0.91 cm^2.  Valve area (Vmean): 0.89 cm^2. - Mitral valve: Calcified annulus. There was mild regurgitation. - Left atrium: The atrium was moderately dilated. - Right atrium: The atrium was mildly dilated. - Atrial septum: There was a patent foramen ovale.   Impressions:   - Mild global reduction in LV function; restrictive filling with   elevated filling pressure; mild LVH; biatrial enlargement;   heavily calcified aortic valve with probable mild to moderate AS   (mean gradient 16 mmHg; calculated AVA .9 cm2 likely   overestimates severity; dimensionless index .3 not supportive of   severe AS); mild MR; patent foramen ovale.   07/2015 Cath Prox LAD to Mid LAD lesion, 50% stenosed.   1. Calcified moderate proximal-mid LAD stenosis 2. Widely patent, dominant LCx 3. Preserved cardiac output, mildly elevated right-sided intracardiac pressures   Recommend: Medical therapy for nonobstructive CAD and CHF   01/2016 AAA [place] Screen No aneurysm   08/2020 echo   IMPRESSIONS     1. Left ventricular ejection fraction, by estimation, is 60 to 65%. The  left ventricle has normal function. The left ventricle has no regional  wall motion abnormalities. There is moderate left ventricular hypertrophy.  Left ventricular diastolic  parameters are consistent with Grade I diastolic dysfunction (impaired  relaxation). Elevated left atrial pressure. The average left ventricular  global longitudinal strain is -17.6 %.   2. Right ventricular systolic function is normal. The right ventricular  size is normal.   3. The mitral valve is abnormal. Mild mitral valve regurgitation. No  evidence of mitral stenosis.   4. The aortic valve has an indeterminant number of cusps. There is  moderate calcification of the aortic valve. There is moderate thickening  of the aortic valve. Aortic valve regurgitation is mild. Mild to moderate  aortic valve stenosis. Mild to moderate   aortic stenosis is present. Aortic valve mean  gradient measures 17.0  mmHg. Aortic valve peak gradient measures 26.7 mmHg. Aortic valve area, by  VTI measures 1.12 cm.   02/2021 nuclear stress  Findings are consistent with prior myocardial infarction. The study is low risk.   1 mm down sloping ST depression in the inferolateral leads was noted with exercise.  Patient did not achieve target HR and was switched to [place].  There was no significant ST deviation during Lexiscan infusion.   Left ventricular function is abnormal. Global function is mildly reduced. Nuclear stress EF: 50 %. The left ventricular ejection fraction is mildly decreased (45-54%). End diastolic cavity size is mildly enlarged.   Abnormal myovue with motion and diaphragmatic attenuation Possible small inferior wall infarct from apex to base No ischemia EF 50% with inferior and apical hypokinesis     Assessment and Plan   1. CAD - moderate nonobstructive disease by prior cath.  - recent low risk stress test -denies any ongoing symptoms, continue to monitor     2. Hyperlipidemia - has been at goal, continue current meds   3. Aortic stenosis - mild to moderate by most recent echo  -continue to monitor, likely repeat echo next year   4. HTN -at goal, continue current meds   5. Chronic diasotlic heart failure -doing well, continue curren tmeds     Arnoldo Lenis, M.D.

## 2021-10-21 ENCOUNTER — Ambulatory Visit: Payer: Medicare Other | Admitting: Family Medicine

## 2021-10-22 ENCOUNTER — Ambulatory Visit: Payer: Medicare Other | Admitting: Family Medicine

## 2021-10-22 ENCOUNTER — Other Ambulatory Visit: Payer: Self-pay | Admitting: Family Medicine

## 2021-10-22 ENCOUNTER — Encounter: Payer: Self-pay | Admitting: Family Medicine

## 2021-10-22 VITALS — BP 124/60 | HR 56 | Temp 98.3°F | Resp 16 | Ht 66.0 in | Wt 180.4 lb

## 2021-10-22 DIAGNOSIS — R5383 Other fatigue: Secondary | ICD-10-CM

## 2021-10-22 DIAGNOSIS — Z23 Encounter for immunization: Secondary | ICD-10-CM | POA: Diagnosis not present

## 2021-10-22 DIAGNOSIS — G4733 Obstructive sleep apnea (adult) (pediatric): Secondary | ICD-10-CM

## 2021-10-22 DIAGNOSIS — E1159 Type 2 diabetes mellitus with other circulatory complications: Secondary | ICD-10-CM | POA: Diagnosis not present

## 2021-10-22 DIAGNOSIS — D649 Anemia, unspecified: Secondary | ICD-10-CM

## 2021-10-22 DIAGNOSIS — M5116 Intervertebral disc disorders with radiculopathy, lumbar region: Secondary | ICD-10-CM | POA: Diagnosis not present

## 2021-10-22 DIAGNOSIS — C61 Malignant neoplasm of prostate: Secondary | ICD-10-CM

## 2021-10-22 DIAGNOSIS — M545 Low back pain, unspecified: Secondary | ICD-10-CM

## 2021-10-22 LAB — LIPID PANEL
Cholesterol: 114 mg/dL (ref 0–200)
HDL: 44.3 mg/dL (ref >39.00)
LDL Cholesterol: 45 mg/dL (ref 0–99)
NonHDL: 70.01
Total CHOL/HDL Ratio: 3
Triglycerides: 126 mg/dL (ref 0.0–149.0)
VLDL: 25.2 mg/dL (ref 0.0–40.0)

## 2021-10-22 LAB — COMPREHENSIVE METABOLIC PANEL
ALT: 13 U/L (ref 0–53)
AST: 19 U/L (ref 0–37)
Albumin: 4.2 g/dL (ref 3.5–5.2)
Alkaline Phosphatase: 85 U/L (ref 39–117)
BUN: 14 mg/dL (ref 6–23)
CO2: 32 mEq/L (ref 19–32)
Calcium: 9.3 mg/dL (ref 8.4–10.5)
Chloride: 100 mEq/L (ref 96–112)
Creatinine, Ser: 0.77 mg/dL (ref 0.40–1.50)
GFR: 90.45 mL/min (ref >60.00)
Glucose, Bld: 118 mg/dL — ABNORMAL HIGH (ref 70–99)
Potassium: 4.2 mEq/L (ref 3.5–5.1)
Sodium: 139 mEq/L (ref 135–145)
Total Bilirubin: 0.7 mg/dL (ref 0.2–1.2)
Total Protein: 7.2 g/dL (ref 6.0–8.3)

## 2021-10-22 LAB — CBC
HCT: 39.1 % (ref 39.0–52.0)
Hemoglobin: 12.7 g/dL — ABNORMAL LOW (ref 13.0–17.0)
MCHC: 32.4 g/dL (ref 30.0–36.0)
MCV: 84.2 fl (ref 78.0–100.0)
Platelets: 238 10*3/uL (ref 150.0–400.0)
RBC: 4.65 Mil/uL (ref 4.22–5.81)
RDW: 16.1 % — ABNORMAL HIGH (ref 11.5–15.5)
WBC: 9 10*3/uL (ref 4.0–10.5)

## 2021-10-22 LAB — HEMOGLOBIN A1C: Hgb A1c MFr Bld: 7.6 % — ABNORMAL HIGH (ref 4.6–6.5)

## 2021-10-22 MED ORDER — GABAPENTIN 300 MG PO CAPS: 300.0000 mg | ORAL_CAPSULE | Freq: Two times a day (BID) | ORAL | 3 refills | Status: DC

## 2021-10-22 MED ORDER — ATORVASTATIN CALCIUM 80 MG PO TABS: 80.0000 mg | ORAL_TABLET | Freq: Every day | ORAL | 1 refills | Status: DC

## 2021-10-22 NOTE — Progress Notes (Signed)
See lab notes

## 2021-10-22 NOTE — Patient Instructions (Addendum)
I will check labs and let you know if changes needed for diabetes.  Schedule eye doctor visit and make sure they send me a report. I will check labs for fatigue, but untreated sleep apnea may be an issue. Please call the specialist where I referred you last year. Let me know if they need a new referral. : [phone] Please have back x-ray performed at the [facility] location below.  If any concerns on the imaging I will let you know.  Okay to continue gabapentin 1 in the morning and 2 at night, with additional 1 at night only if needed.  I will look to see who has seen you most recently for your back but would like you to follow-up with Ortho/spine specialist to decide on next step in treatment.  Please follow-up with me in the next 1 month to follow-up on fatigue.  Return to the clinic or go to the nearest emergency room if any of your symptoms worsen or new symptoms occur.  [facility] 8:30-4:30 during weekdays, no appointment needed [address]  [place], [place] [address]   Fatigue If you have fatigue, you feel tired all the time and have a lack of energy or a lack of motivation. Fatigue may make it difficult to start or complete tasks because of exhaustion. Occasional or mild fatigue is often a normal response to activity or life. However, long-term (chronic) or extreme fatigue may be a symptom of a medical condition such as: Depression. Not having enough red blood cells or hemoglobin in the blood (anemia). A problem with a small gland located in the lower front part of the neck (thyroid disorder). Rheumatologic conditions. These are problems related to the body's defense system (immune system). Infections, especially certain viral infections. Fatigue can also lead to negative health outcomes over time. Follow these instructions at home: Medicines Take over-the-counter and prescription medicines only as told by your health care provider. Take a multivitamin if told by your  health care provider. Do not use herbal or dietary supplements unless they are approved by your health care provider. Eating and drinking  Avoid heavy meals in the evening. Eat a well-balanced diet, which includes lean proteins, whole grains, plenty of fruits and vegetables, and low-fat dairy products. Avoid eating or drinking too many products with caffeine in them. Avoid alcohol. Drink enough fluid to keep your urine pale yellow. Activity  Exercise regularly, as told by your health care provider. Use or practice techniques to help you relax, such as yoga, tai chi, meditation, or massage therapy. Lifestyle Change situations that cause you stress. Try to keep your work and personal schedules in balance. Do not use recreational or illegal drugs. General instructions Monitor your fatigue for any changes. Go to bed and get up at the same time every day. Avoid fatigue by pacing yourself during the day and getting enough sleep at night. Maintain a healthy weight. Contact a health care provider if: Your fatigue does not get better. You have a fever. You suddenly lose or gain weight. You have headaches. You have trouble falling asleep or sleeping through the night. You feel angry, guilty, anxious, or sad. You have swelling in your legs or another part of your body. Get help right away if: You feel confused, feel like you might faint, or faint. Your vision is blurry or you have a severe headache. You have severe pain in your abdomen, your back, or the area between your waist and hips (  pelvis). You have chest pain, shortness of breath, or an irregular or fast heartbeat. You are unable to urinate, or you urinate less than normal. You have abnormal bleeding from the rectum, nose, lungs, nipples, or, if you are male, the vagina. You vomit blood. You have thoughts about hurting yourself or others. These symptoms may be an emergency. Get help right away. Call 911. Do not wait to see if the  symptoms will go away. Do not drive yourself to the hospital. Get help right away if you feel like you may hurt yourself or others, or have thoughts about taking your own life. Go to your nearest emergency room or: Call 911. Call the [facility] at [phone] or [phone]. This is open 24 hours a day. Text the Crisis Text Line at 269-538-6483. Summary If you have fatigue, you feel tired all the time and have a lack of energy or a lack of motivation. Fatigue may make it difficult to start or complete tasks because of exhaustion. Long-term (chronic) or extreme fatigue may be a symptom of a medical condition. Exercise regularly, as told by your health care provider. Change situations that cause you stress. Try to keep your work and personal schedules in balance. This information is not intended to replace advice given to you by your health care provider. Make sure you discuss any questions you have with your health care provider. Document Revised: 12/22/2020 Document Reviewed: 12/22/2020 Elsevier Patient Education  [place].   Chronic Back Pain When back pain lasts longer than 3 months, it is called chronic back pain. The cause of your back pain may not be known. Some common causes include: Wear and tear (degenerative disease) of the bones, ligaments, or disks in your back. Inflammation and stiffness in your back (arthritis). People who have chronic back pain often go through certain periods in which the pain is more intense (flare-ups). Many people can learn to manage the pain with home care. Follow these instructions at home: Pay attention to any changes in your symptoms. Take these actions to help with your pain: Managing pain and stiffness     If directed, apply ice to the painful area. Your health care provider may recommend applying ice during the first 24-48 hours after a flare-up begins. To do this: Put ice in a plastic bag. Place a towel between your  skin and the bag. Leave the ice on for 20 minutes, 2-3 times per day. If directed, apply heat to the affected area as often as told by your health care provider. Use the heat source that your health care provider recommends, such as a moist heat pack or a heating pad. Place a towel between your skin and the heat source. Leave the heat on for 20-30 minutes. Remove the heat if your skin turns bright red. This is especially important if you are unable to feel pain, heat, or cold. You may have a greater risk of getting burned. Try soaking in a warm tub. Activity  Avoid bending and other activities that make the problem worse. Maintain a proper position when standing or sitting: When standing, keep your upper back and neck straight, with your shoulders pulled back. Avoid slouching. When sitting, keep your back straight and relax your shoulders. Do not round your shoulders or pull them backward. Do not sit or stand in one place for long periods of time. Take brief periods of rest throughout the day. This will reduce your pain. Resting in a lying or  standing position is usually better than sitting to rest. When you are resting for longer periods, mix in some mild activity or stretching between periods of rest. This will help to prevent stiffness and pain. Get regular exercise. Ask your health care provider what activities are safe for you. Do not lift anything that is heavier than 10 lb (4.5 kg), or the limit that you are told, until your health care provider says that it is safe. Always use proper lifting technique, which includes: Bending your knees. Keeping the load close to your body. Avoiding twisting. Sleep on a firm mattress in a comfortable position. Try lying on your side with your knees slightly bent. If you lie on your back, put a pillow under your knees. Medicines Treatment may include medicines for pain and inflammation taken by mouth or applied to the skin, prescription pain medicine,  or muscle relaxants. Take over-the-counter and prescription medicines only as told by your health care provider. Ask your health care provider if the medicine prescribed to you: Requires you to avoid driving or using machinery. Can cause constipation. You may need to take these actions to prevent or treat constipation: Drink enough fluid to keep your urine pale yellow. Take over-the-counter or prescription medicines. Eat foods that are high in fiber, such as beans, whole grains, and fresh fruits and vegetables. Limit foods that are high in fat and processed sugars, such as fried or sweet foods. General instructions Do not use any products that contain nicotine or tobacco, such as cigarettes, e-cigarettes, and chewing tobacco. If you need help quitting, ask your health care provider. Keep all follow-up visits as told by your health care provider. This is important. Contact a health care provider if: You have pain that is not relieved with rest or medicine. Your pain gets worse, or you have new pain. You have a high fever. You have rapid weight loss. You have trouble doing your normal activities. Get help right away if: You have weakness or numbness in one or both of your legs or feet. You have trouble controlling your bladder or your bowels. You have severe back pain and have any of the following: Nausea or vomiting. Pain in your abdomen. Shortness of breath or you faint. Summary Chronic back pain is back pain that lasts longer than 3 months. When a flare-up begins, apply ice to the painful area for the first 24-48 hours. Apply a moist heat pad or use a heating pad on the painful area as directed by your health care provider. When you are resting for longer periods, mix in some mild activity or stretching between periods of rest. This will help to prevent stiffness and pain. This information is not intended to replace advice given to you by your health care provider. Make sure you discuss  any questions you have with your health care provider. Document Revised: 04/11/2019 Document Reviewed: 04/11/2019 Elsevier Patient Education  [place].

## 2021-10-22 NOTE — Progress Notes (Signed)
Subjective:  Patient ID: Jonathan Gilmore, male    DOB: 1950-12-05  Age: 71 y.o. MRN: [id number]  CC:  Chief Complaint  Patient presents with   Back Pain    Pt notes continued back pain, continued worsening pain, fractured disc    Fatigue    Notes decrease in energy levels as he ages and wanted to know if there is anything he can take to increase his energy     HPI Jonathan Gilmore presents for   Back pain: Same pain for awhile. No recent injury.  Worse night time leg symptoms depending on amount of waking during the day -2-3 gabapentin.  Chronic back pain. No current specialist - has seen in past.  Hx of disc fragment impacting nerve. Flairs at times and has to be in bed for a few days. Tried PT.  Prior injections with only temporary relief.  Gabapentin at night helps with sleep. Wears back brace.  No bowel or bladder incontinence, no saddle anesthesia, no lower extremity weakness.  On surveillance for prostate cancer by urology, last appt 09/07/21.   XR lumbar spine in 08/2017: IMPRESSION: 1. Lumbar spine scoliosis concave right. Degenerative changes lumbar spine. No acute bony abnormality.   2.  Aortoiliac atherosclerotic vascular disease  Fatigue Has noticed gradual decrease in energy levels with age. Past few months more fatigued. No chest pains.  No melena or hematochezia.  History of CAD with cardiology visit August 3 noted.  Discussed some chest pains and dyspnea with cardiologist at that time.  Stress testing December 2022 with small inferior infarct, no current ischemia.  Mild to moderate aortic stenosis on echo prior, most recent echo June 2022 with EF 60 to 03%, grade 1 diastolic dysfunction without wall motion abnormality.  Mild MR, mild AI, mild to moderate AS. History of severe obstructive sleep apnea but not using CPAP machine due to discomfort and outdated - no recent sleep study or visit with specialist. Referred to sleep specialist in 02/2021. Did not schedule.   Diabetes as below.   Diabetes: With CAD, PAD as above.  Uncontrolled with A1c 7.9 in March.  Continued on same meds at that time with plan to cut out sweets and watch diet.  Treated with metformin, he is on ACE inhibitor and statin, with gabapentin 600 mg nightly for neuropathic symptoms.  1 gabapentin in am and 2 at night - rare 3rd pill at night depending on walking.  Disc disease as well as restless legs.  Has changed diet - cut back on sweets.  Home readings 130-160.    Microalbumin: normal on June 8th.  Optho, foot exam, pneumovax:  Due for optho visit - plans to schedule appointment Due for repeat pneumovax (last one in 2014, prevnar 2020).  Immunization History  Administered Date(s) Administered   Fluad Quad(high Dose 65+) 11/29/2018   Influenza Split 01/19/2011   Influenza,inj,Quad PF,6+ Mos 02/25/2013   Moderna Sars-Covid-2 Vaccination 04/19/2019, 05/18/2019, 02/27/2020   Pneumococcal Conjugate-13 11/29/2018   Pneumococcal Polysaccharide-23 02/25/2013   Tdap 08/13/2020     Lab Results  Component Value Date   HGBA1C 7.9 (A) 05/25/2021   HGBA1C 6.8 (H) 12/01/2020   HGBA1C 7.1 (H) 08/13/2020   Lab Results  Component Value Date   MICROALBUR <0.7 08/20/2021   [place] 38 12/01/2020   CREATININE 0.72 02/25/2021            History Patient Active Problem List   Diagnosis Date Noted   Prostate cancer ([place]) 06/08/2021  Esophagitis 02/09/2021   History of colonic polyps 02/09/2021   Intestinal metaplasia of antrum of stomach without dysplasia 02/09/2021   Insomnia 03/12/2019   Pure hypercholesterolemia 11/29/2018   Hypogonadism, male 11/23/2018   Essential hypertension, benign 11/13/2018   High risk medication use 10/28/2018   Carotid stenosis, asymptomatic, bilateral 10/28/2018   Tobacco use 10/28/2018   Polyp of colon 10/28/2018   Lumbar disc herniation with radiculopathy 12/21/2017   Spinal stenosis of lumbar region without neurogenic claudication  12/21/2017   CAD -50% LAD  08/09/2015   Moderate aortic stenosis 08/09/2015   Acute pulmonary edema ([place]) 70/26/3785   Acute diastolic CHF (congestive heart failure) ([place]) 08/07/2015   Hypersomnia with sleep apnea, unspecified 09/20/2013   Syncope 09/19/2013   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   OSA (obstructive sleep apnea) -non compliant 09/19/2013   Narcolepsy and cataplexy 09/19/2013   Facial abscess 04/28/2013   Facial cellulitis 04/28/2013   Mixed hyperlipidemia 04/28/2013   Tobacco dependence 04/28/2013   Sepsis ([place]) 04/28/2013   Gout 88/50/2774   Diastolic CHF ([place]) 12/87/8676   Pleuritic pain 02/24/2013   Chest pain 02/24/2013   Murmur 02/24/2013   Pericarditis 02/24/2013   DM type 2 causing vascular disease ([place]) 02/24/2013   Essential hypertension 02/24/2013   Cervical spondylosis with myelopathy 01/19/2011   Past Medical History:  Diagnosis Date   Abnormal cardiovascular stress test 03/2013   Anxiety    Arthritis    Back pain    CHF (congestive heart failure) ([place])    Coronary artery disease    Multivessel with significant LAD involvement by chest CT 2014   Depression    Diastolic dysfunction    Essential hypertension    Lumbar herniated disc    L4-L5   MRSA (methicillin resistant staph aureus) culture positive    Neuropathy    Shortness of breath dyspnea    Sleep apnea    No cpap   Type 2 diabetes mellitus ([place])    Past Surgical History:  Procedure Laterality Date   ANTERIOR CERVICAL DECOMP/DISCECTOMY FUSION  01/18/2011   Procedure: ANTERIOR CERVICAL DECOMPRESSION/DISCECTOMY FUSION 2 LEVELS;  Surgeon: Cooper Render Pool;  [facility];  Service: Neurosurgery;  Laterality: N/A;  anterior cervical discectomy and fusion with allograft and plating, cervical five-six, cervical six-seven   BIOPSY N/A 04/04/2015   Procedure: BIOPSY;  Surgeon: Rogene Houston, MD;  [facility];  Service: Endoscopy;  Laterality: N/A;   BIOPSY  11/09/2019    Procedure: BIOPSY;  Surgeon: Harvel Quale, MD;  [facility];  Service: Gastroenterology;;   Bone spur removed  1997   Left shoulder   CARDIAC CATHETERIZATION N/A 08/08/2015   Procedure: Right/Left Heart Cath and Coronary Angiography;  Surgeon: Sherren Mocha, MD;  Location: [facility];  Service: Cardiovascular;  Laterality: N/A;   CARPAL TUNNEL RELEASE Right    COLONOSCOPY WITH PROPOFOL N/A 11/09/2019   Procedure: COLONOSCOPY WITH PROPOFOL;  Surgeon: Harvel Quale, MD;  [facility];  Service: Gastroenterology;  Laterality: N/A;  900   COLONOSCOPY WITH PROPOFOL N/A 02/27/2021   Procedure: COLONOSCOPY WITH PROPOFOL;  Surgeon: Harvel Quale, MD;  [facility];  Service: Gastroenterology;  Laterality: N/A;  7:30   ESOPHAGEAL DILATION N/A 04/04/2015   Procedure: ESOPHAGEAL DILATION;  Surgeon: Rogene Houston, MD;  [facility];  Service: Endoscopy;  Laterality: N/A;   ESOPHAGEAL DILATION N/A 11/09/2019   Procedure: ESOPHAGEAL DILATION;  Surgeon: Montez Morita,  Quillian Quince, MD;  [facility];  Service: Gastroenterology;  Laterality: N/A;   ESOPHAGOGASTRODUODENOSCOPY N/A 04/04/2015   Procedure: ESOPHAGOGASTRODUODENOSCOPY (EGD);  Surgeon: Rogene Houston, MD;  [facility];  Service: Endoscopy;  Laterality: N/A;  1240   ESOPHAGOGASTRODUODENOSCOPY (EGD) WITH PROPOFOL N/A 11/09/2019   Procedure: ESOPHAGOGASTRODUODENOSCOPY (EGD) WITH PROPOFOL;  Surgeon: Harvel Quale, MD;  [facility];  Service: Gastroenterology;  Laterality: N/A;   HEMORRHOID SURGERY     POLYPECTOMY  11/09/2019   Procedure: POLYPECTOMY;  Surgeon: Harvel Quale, MD;  [facility];  Service: Gastroenterology;;   POLYPECTOMY  02/27/2021   Procedure: POLYPECTOMY;  Surgeon: Harvel Quale, MD;  [facility];  Service: Gastroenterology;;   TONSILLECTOMY     Allergies   Allergen Reactions   Morphine And Related Nausea And Vomiting   Prior to Admission medications   Medication Sig Start Date End Date Taking? Authorizing Provider  ACCU-CHEK AVIVA PLUS test strip USE AS INSTRUCTED 06/13/19  Yes Corum, Rex Kras, MD  aspirin EC 81 MG tablet Take 81 mg by mouth daily.   Yes [provider]  atorvastatin (LIPITOR) 80 MG tablet TAKE 1 TABLET BY MOUTH EVERY DAY 05/11/21  Yes Branch, Alphonse Guild, MD  Calcium-Magnesium-Zinc (CAL-MAG-ZINC PO) Take 1 tablet by mouth daily.   Yes [provider]  chlorhexidine (HIBICLENS) 4 % external liquid Apply topically daily as needed. 08/14/21  Yes Volney American, PA-C  Flaxseed, Linseed, (FLAXSEED OIL) 1000 MG CAPS Take 1,000 mg by mouth daily.   Yes [provider]  furosemide (LASIX) 40 MG tablet Take 1 tablet (40 mg total) by mouth daily. 03/17/21  Yes Arnoldo Lenis, MD  gabapentin (NEURONTIN) 300 MG capsule TAKE 1 CAPSULE TWICE A DAY 07/27/21  Yes Wendie Agreste, MD  lisinopril (ZESTRIL) 20 MG tablet Take 1 tablet (20 mg total) by mouth daily. 02/23/21 10/16/78 Yes Wendie Agreste, MD  magnesium oxide (MAG-OX) 400 MG tablet Take 400 mg by mouth at bedtime.   Yes [provider]  metFORMIN (GLUCOPHAGE) 500 MG tablet TAKE 2 TABLETS BY MOUTH TWICE  DAILY 10/12/21  Yes Wendie Agreste, MD  metoprolol succinate (TOPROL-XL) 25 MG 24 hr tablet TAKE 1 TABLET BY MOUTH EVERYDAY AT BEDTIME 06/19/21  Yes Branch, Alphonse Guild, MD  Multiple Vitamin (MULTIVITAMIN WITH MINERALS) TABS Take 1 tablet by mouth daily.   Yes [provider]  mupirocin ointment (BACTROBAN) 2 % Apply 1 application. topically 2 (two) times daily. 08/14/21  Yes Volney American, PA-C  nitroGLYCERIN (NITROSTAT) 0.4 MG SL tablet Place 1 tablet (0.4 mg total) under the tongue every 5 (five) minutes x 3 doses as needed for chest pain (if no relief after 2nd dose, proceed to ED for an evaluation). 10/15/21  Yes BranchAlphonse Guild, MD  omeprazole (PRILOSEC) 40 MG capsule Take 1 capsule (40 mg total) by mouth daily. 03/17/21  Yes Harvel Quale, MD  varenicline (CHANTIX CONTINUING MONTH PAK) 1 MG tablet Take 1 tablet (1 mg total) by mouth 2 (two) times daily. Patient not taking: Reported on 10/22/2021 08/20/21   Wendie Agreste, MD  Varenicline Tartrate, Starter, (CHANTIX STARTING MONTH PAK) 0.5 MG X 11 & 1 MG X 42 TBPK Take one 0.5 mg tablet by mouth once daily for 3 days, then increase to one 0.5 mg tablet twice daily for 4 days, then increase to one 1 mg tablet twice daily. Patient not taking: Reported on  10/22/2021 08/20/21   Wendie Agreste, MD   Social History   Socioeconomic History   Marital status: Married    Spouse name: Not on file   Number of children: Not on file   Years of education: Not on file   Highest education level: Not on file  Occupational History   Not on file  Tobacco Use   Smoking status: Every Day    Packs/day: 1.00    Years: 50.00    Total pack years: 50.00    Types: Cigarettes    Start date: 11/26/1967   Smokeless tobacco: Former   Tobacco comments:    smoking x 50 yrs  Vaping Use   Vaping Use: Never used  Substance and Sexual Activity   Alcohol use: Yes    Alcohol/week: 1.0 standard drink of alcohol    Types: 1 Standard drinks or equivalent per week    Comment: rare   Drug use: Yes    Frequency: 1.0 times per week    Types: Marijuana    Comment: as needed   Sexual activity: Yes  Other Topics Concern   Not on file  Social History Narrative   Lives with wife   Right Handed   Drinks 2-3 cups caffeine daily   Social Determinants of Health   Financial Resource Strain: Not on file  Food Insecurity: Not on file  Transportation Needs: Not on file  Physical Activity: Not on file  Stress: Not on file  Social Connections: Not on file  Intimate Partner Violence: Not on file    Review of Systems Per HPI  Objective:   Vitals:   10/22/21 1135  BP: 124/60   Pulse: (!) 56  Resp: 16  Temp: 98.3 F (36.8 C)  TempSrc: Oral  SpO2: 97%  Weight: 180 lb 6.4 oz (81.8 kg)  Height: '5\' 6"'  (1.676 m)     Physical Exam Vitals reviewed.  Constitutional:      Appearance: He is well-developed.  HENT:     Head: Normocephalic and atraumatic.  Neck:     Vascular: No carotid bruit or JVD.  Cardiovascular:     Rate and Rhythm: Normal rate and regular rhythm.     Heart sounds: Murmur (2 out of 6 systolic) heard.  Pulmonary:     Effort: Pulmonary effort is normal.     Breath sounds: Normal breath sounds. No rales.  Musculoskeletal:     Right lower leg: No edema.     Left lower leg: No edema.     Comments: Lumbar spine, some tenderness over the mid lower lumbar spine.  Negative seated straight leg raise bilaterally.  Ambulating without assistive device.  Skin:    General: Skin is warm and dry.  Neurological:     Mental Status: He is alert and oriented to person, place, and time.  Psychiatric:        Mood and Affect: Mood normal.       Assessment & Plan:  Jonathan Gilmore is a 71 y.o. male . DM type 2 causing vascular disease ([place]) - Plan: Comprehensive metabolic panel, Lipid panel, Hemoglobin A1c, atorvastatin (LIPITOR) 80 MG tablet  -Check updated A1c, continue same regimen for now with metformin but potentially may need additional medication.  Continue metformin same dose for now.  Fatigue, unspecified type - Plan: Comprehensive metabolic panel, CBC OSA (obstructive sleep apnea)  -Possibly multifactorial.  Has seen cardiology as above, and without chest pain or exertional symptoms less likely cardiac.  Untreated sleep  apnea likely contributing.  Stressed importance of follow-up with sleep specialist, previously referred, phone number provided to contact or can place new referral if needed.  Check CBC, CMP.  Check diabetes control as above.  Handout given.  Recheck in 1 month to follow-up on fatigue, RTC/ER Precautions if worsening sooner.  Need  for pneumococcal vaccination - Plan: Pneumococcal polysaccharide vaccine 23-valent greater than or equal to 71yo subcutaneous/IM  Lumbar disc herniation with radiculopathy - Plan: gabapentin (NEURONTIN) 300 MG capsule, DG Lumbar Spine Complete Midline low back pain, unspecified chronicity, unspecified whether sciatica present - Plan: DG Lumbar Spine Complete Prostate cancer (HCC)  -Chronic low back pain, check imaging given history of prostate cancer, continue gabapentin with higher dosing and evening if needed.  Adjusted quantity.  Chart reviewed, previously evaluated by Dr. Lorin Mercy with orthopedics, message sent to patient to call and schedule follow-up visit or can refer if that is required.  Meds ordered this encounter  Medications   atorvastatin (LIPITOR) 80 MG tablet    Sig: Take 1 tablet (80 mg total) by mouth daily.    Dispense:  90 tablet    Refill:  1   gabapentin (NEURONTIN) 300 MG capsule    Sig: Take 1-2 capsules (300-600 mg total) by mouth 2 (two) times daily.    Dispense:  270 capsule    Refill:  3    Requesting 1 year supply   Patient Instructions  I will check labs and let you know if changes needed for diabetes.  Schedule eye doctor visit and make sure they send me a report. I will check labs for fatigue, but untreated sleep apnea may be an issue. Please call the specialist where I referred you last year. Let me know if they need a new referral. : [phone] Please have back x-ray performed at the [facility] location below.  If any concerns on the imaging I will let you know.  Okay to continue gabapentin 1 in the morning and 2 at night, with additional 1 at night only if needed.  I will look to see who has seen you most recently for your back but would like you to follow-up with Ortho/spine specialist to decide on next step in treatment.  Please follow-up with me in the next 1 month to follow-up on fatigue.  Return to the clinic or go to the nearest emergency room if any  of your symptoms worsen or new symptoms occur.  [facility]  Walk in 8:30-4:30 during weekdays, no appointment needed [place].  [place], [place] [address]   Fatigue If you have fatigue, you feel tired all the time and have a lack of energy or a lack of motivation. Fatigue may make it difficult to start or complete tasks because of exhaustion. Occasional or mild fatigue is often a normal response to activity or life. However, long-term (chronic) or extreme fatigue may be a symptom of a medical condition such as: Depression. Not having enough red blood cells or hemoglobin in the blood (anemia). A problem with a small gland located in the lower front part of the neck (thyroid disorder). Rheumatologic conditions. These are problems related to the body's defense system (immune system). Infections, especially certain viral infections. Fatigue can also lead to negative health outcomes over time. Follow these instructions at home: Medicines Take over-the-counter and prescription medicines only as told by your health care provider. Take a multivitamin if told by your health care provider. Do not use herbal or dietary supplements  unless they are approved by your health care provider. Eating and drinking  Avoid heavy meals in the evening. Eat a well-balanced diet, which includes lean proteins, whole grains, plenty of fruits and vegetables, and low-fat dairy products. Avoid eating or drinking too many products with caffeine in them. Avoid alcohol. Drink enough fluid to keep your urine pale yellow. Activity  Exercise regularly, as told by your health care provider. Use or practice techniques to help you relax, such as yoga, tai chi, meditation, or massage therapy. Lifestyle Change situations that cause you stress. Try to keep your work and personal schedules in balance. Do not use recreational or illegal drugs. General instructions Monitor your fatigue for any changes. Go to bed and get up  at the same time every day. Avoid fatigue by pacing yourself during the day and getting enough sleep at night. Maintain a healthy weight. Contact a health care provider if: Your fatigue does not get better. You have a fever. You suddenly lose or gain weight. You have headaches. You have trouble falling asleep or sleeping through the night. You feel angry, guilty, anxious, or sad. You have swelling in your legs or another part of your body. Get help right away if: You feel confused, feel like you might faint, or faint. Your vision is blurry or you have a severe headache. You have severe pain in your abdomen, your back, or the area between your waist and hips (pelvis). You have chest pain, shortness of breath, or an irregular or fast heartbeat. You are unable to urinate, or you urinate less than normal. You have abnormal bleeding from the rectum, nose, lungs, nipples, or, if you are male, the vagina. You vomit blood. You have thoughts about hurting yourself or others. These symptoms may be an emergency. Get help right away. Call 911. Do not wait to see if the symptoms will go away. Do not drive yourself to the hospital. Get help right away if you feel like you may hurt yourself or others, or have thoughts about taking your own life. Go to your nearest emergency room or: Call 911. Call the [facility] at [phone] or 988. This is open 24 hours a day. Text the Crisis Text Line at (804) 732-0072. Summary If you have fatigue, you feel tired all the time and have a lack of energy or a lack of motivation. Fatigue may make it difficult to start or complete tasks because of exhaustion. Long-term (chronic) or extreme fatigue may be a symptom of a medical condition. Exercise regularly, as told by your health care provider. Change situations that cause you stress. Try to keep your work and personal schedules in balance. This information is not intended to replace advice  given to you by your health care provider. Make sure you discuss any questions you have with your health care provider. Document Revised: 12/22/2020 Document Reviewed: 12/22/2020 Elsevier Patient Education  [place].   Chronic Back Pain When back pain lasts longer than 3 months, it is called chronic back pain. The cause of your back pain may not be known. Some common causes include: Wear and tear (degenerative disease) of the bones, ligaments, or disks in your back. Inflammation and stiffness in your back (arthritis). People who have chronic back pain often go through certain periods in which the pain is more intense (flare-ups). Many people can learn to manage the pain with home care. Follow these instructions at home: Pay attention to any changes in your symptoms. Take these  actions to help with your pain: Managing pain and stiffness     If directed, apply ice to the painful area. Your health care provider may recommend applying ice during the first 24-48 hours after a flare-up begins. To do this: Put ice in a plastic bag. Place a towel between your skin and the bag. Leave the ice on for 20 minutes, 2-3 times per day. If directed, apply heat to the affected area as often as told by your health care provider. Use the heat source that your health care provider recommends, such as a moist heat pack or a heating pad. Place a towel between your skin and the heat source. Leave the heat on for 20-30 minutes. Remove the heat if your skin turns bright red. This is especially important if you are unable to feel pain, heat, or cold. You may have a greater risk of getting burned. Try soaking in a warm tub. Activity  Avoid bending and other activities that make the problem worse. Maintain a proper position when standing or sitting: When standing, keep your upper back and neck straight, with your shoulders pulled back. Avoid slouching. When sitting, keep your back straight and relax your  shoulders. Do not round your shoulders or pull them backward. Do not sit or stand in one place for long periods of time. Take brief periods of rest throughout the day. This will reduce your pain. Resting in a lying or standing position is usually better than sitting to rest. When you are resting for longer periods, mix in some mild activity or stretching between periods of rest. This will help to prevent stiffness and pain. Get regular exercise. Ask your health care provider what activities are safe for you. Do not lift anything that is heavier than 10 lb (4.5 kg), or the limit that you are told, until your health care provider says that it is safe. Always use proper lifting technique, which includes: Bending your knees. Keeping the load close to your body. Avoiding twisting. Sleep on a firm mattress in a comfortable position. Try lying on your side with your knees slightly bent. If you lie on your back, put a pillow under your knees. Medicines Treatment may include medicines for pain and inflammation taken by mouth or applied to the skin, prescription pain medicine, or muscle relaxants. Take over-the-counter and prescription medicines only as told by your health care provider. Ask your health care provider if the medicine prescribed to you: Requires you to avoid driving or using machinery. Can cause constipation. You may need to take these actions to prevent or treat constipation: Drink enough fluid to keep your urine pale yellow. Take over-the-counter or prescription medicines. Eat foods that are high in fiber, such as beans, whole grains, and fresh fruits and vegetables. Limit foods that are high in fat and processed sugars, such as fried or sweet foods. General instructions Do not use any products that contain nicotine or tobacco, such as cigarettes, e-cigarettes, and chewing tobacco. If you need help quitting, ask your health care provider. Keep all follow-up visits as told by your health  care provider. This is important. Contact a health care provider if: You have pain that is not relieved with rest or medicine. Your pain gets worse, or you have new pain. You have a high fever. You have rapid weight loss. You have trouble doing your normal activities. Get help right away if: You have weakness or numbness in one or both of your legs or feet. You have  trouble controlling your bladder or your bowels. You have severe back pain and have any of the following: Nausea or vomiting. Pain in your abdomen. Shortness of breath or you faint. Summary Chronic back pain is back pain that lasts longer than 3 months. When a flare-up begins, apply ice to the painful area for the first 24-48 hours. Apply a moist heat pad or use a heating pad on the painful area as directed by your health care provider. When you are resting for longer periods, mix in some mild activity or stretching between periods of rest. This will help to prevent stiffness and pain. This information is not intended to replace advice given to you by your health care provider. Make sure you discuss any questions you have with your health care provider. Document Revised: 04/11/2019 Document Reviewed: 04/11/2019 Elsevier Patient Education  2023 [place],   Merri Ray, MD [place] 10/22/21 12:40 PM

## 2021-10-24 ENCOUNTER — Other Ambulatory Visit: Payer: Self-pay | Admitting: Family Medicine

## 2021-10-24 DIAGNOSIS — R5383 Other fatigue: Secondary | ICD-10-CM

## 2021-10-24 NOTE — Progress Notes (Signed)
Tsh added for upcoming CBC wth fatigue and reports of cold natured.

## 2021-11-25 ENCOUNTER — Encounter: Payer: Medicare Other | Admitting: Family Medicine

## 2021-12-17 ENCOUNTER — Encounter: Payer: Self-pay | Admitting: Family Medicine

## 2021-12-17 ENCOUNTER — Ambulatory Visit (INDEPENDENT_AMBULATORY_CARE_PROVIDER_SITE_OTHER): Payer: Medicare Other | Admitting: Family Medicine

## 2021-12-17 VITALS — BP 130/58 | HR 63 | Temp 98.8°F | Ht 66.0 in | Wt 182.8 lb

## 2021-12-17 DIAGNOSIS — R059 Cough, unspecified: Secondary | ICD-10-CM

## 2021-12-17 DIAGNOSIS — U071 COVID-19: Secondary | ICD-10-CM

## 2021-12-17 LAB — POC COVID19 BINAXNOW: SARS Coronavirus 2 Ag: POSITIVE — AB

## 2021-12-17 MED ORDER — NIRMATRELVIR/RITONAVIR (PAXLOVID)TABLET: 3.0000 | ORAL_TABLET | Freq: Two times a day (BID) | ORAL | 0 refills | Status: AC

## 2021-12-17 NOTE — Progress Notes (Signed)
Subjective:  Patient ID: Jonathan Gilmore, male    DOB: 07-26-1950  Age: 71 y.o. MRN: [id number]  CC:  Chief Complaint  Patient presents with   Fatigue    Pt tested positive for covid last night     HPI Enrique D Rosato presents for   Covid 19 infection: Initial symptoms started Saturday 9/30. Had new covid booster day prior.  Fatigued, run down. Some cough, congestion, sneezing.  Positive home testing last night. About the same past few days. Not better or worse.  No fever. No chest pain, dyspnea or confusion. Able to drink fluids. Tx: none.  Currently on day 5. Would like to consider antiviral.  He does have a history of tobacco use, coronary artery disease, CHF, OSA, diabetes. Last took lipitor this morning.  GFR 90 on 10/22/21.   History Patient Active Problem List   Diagnosis Date Noted   Prostate cancer ([place]) 06/08/2021   Esophagitis 02/09/2021   History of colonic polyps 02/09/2021   Intestinal metaplasia of antrum of stomach without dysplasia 02/09/2021   Insomnia 03/12/2019   Pure hypercholesterolemia 11/29/2018   Hypogonadism, male 11/23/2018   Essential hypertension, benign 11/13/2018   High risk medication use 10/28/2018   Carotid stenosis, asymptomatic, bilateral 10/28/2018   Tobacco use 10/28/2018   Polyp of colon 10/28/2018   Lumbar disc herniation with radiculopathy 12/21/2017   Spinal stenosis of lumbar region without neurogenic claudication 12/21/2017   CAD -50% LAD  08/09/2015   Moderate aortic stenosis 08/09/2015   Acute pulmonary edema ([place]) 05/39/7673   Acute diastolic CHF (congestive heart failure) ([place]) 08/07/2015   Hypersomnia with sleep apnea, unspecified 09/20/2013   Syncope 09/19/2013   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   OSA (obstructive sleep apnea) -non compliant 09/19/2013   Narcolepsy and cataplexy 09/19/2013   Facial abscess 04/28/2013   Facial cellulitis 04/28/2013   Mixed hyperlipidemia 04/28/2013   Tobacco dependence  04/28/2013   Sepsis ([place]) 04/28/2013   Gout 41/93/7902   Diastolic CHF ([place]) 40/97/3532   Pleuritic pain 02/24/2013   Chest pain 02/24/2013   Murmur 02/24/2013   Pericarditis 02/24/2013   DM type 2 causing vascular disease ([place]) 02/24/2013   Essential hypertension 02/24/2013   Cervical spondylosis with myelopathy 01/19/2011   Past Medical History:  Diagnosis Date   Abnormal cardiovascular stress test 03/2013   Anxiety    Arthritis    Back pain    CHF (congestive heart failure) ([place])    Coronary artery disease    Multivessel with significant LAD involvement by chest CT 2014   Depression    Diastolic dysfunction    Essential hypertension    Lumbar herniated disc    L4-L5   MRSA (methicillin resistant staph aureus) culture positive    Neuropathy    Shortness of breath dyspnea    Sleep apnea    No cpap   Type 2 diabetes mellitus ([place])    Past Surgical History:  Procedure Laterality Date   ANTERIOR CERVICAL DECOMP/DISCECTOMY FUSION  01/18/2011   Procedure: ANTERIOR CERVICAL DECOMPRESSION/DISCECTOMY FUSION 2 LEVELS;  Surgeon: Cooper Render Pool;  Location: [facility];  Service: Neurosurgery;  Laterality: N/A;  anterior cervical discectomy and fusion with allograft and plating, cervical five-six, cervical six-seven   BIOPSY N/A 04/04/2015   Procedure: BIOPSY;  Surgeon: Rogene Houston, MD;  [facility];  Service: Endoscopy;  Laterality: N/A;   BIOPSY  11/09/2019   Procedure: BIOPSY;  Surgeon: Harvel Quale, MD;  [facility];  Service: Gastroenterology;;   Bone spur removed  1997   Left shoulder   CARDIAC CATHETERIZATION N/A 08/08/2015   Procedure: Right/Left Heart Cath and Coronary Angiography;  Surgeon: Sherren Mocha, MD;  Location: [place] [facility];  Service: Cardiovascular;  Laterality: N/A;   CARPAL TUNNEL RELEASE Right    COLONOSCOPY WITH PROPOFOL N/A 11/09/2019   Procedure: COLONOSCOPY WITH PROPOFOL;  Surgeon: Harvel Quale, MD;   [facility];  Service: Gastroenterology;  Laterality: N/A;  900   COLONOSCOPY WITH PROPOFOL N/A 02/27/2021   Procedure: COLONOSCOPY WITH PROPOFOL;  Surgeon: Harvel Quale, MD;  [facility];  Service: Gastroenterology;  Laterality: N/A;  7:30   ESOPHAGEAL DILATION N/A 04/04/2015   Procedure: ESOPHAGEAL DILATION;  Surgeon: Rogene Houston, MD;  [facility];  Service: Endoscopy;  Laterality: N/A;   ESOPHAGEAL DILATION N/A 11/09/2019   Procedure: ESOPHAGEAL DILATION;  Surgeon: Harvel Quale, MD;  [facility];  Service: Gastroenterology;  Laterality: N/A;   ESOPHAGOGASTRODUODENOSCOPY N/A 04/04/2015   Procedure: ESOPHAGOGASTRODUODENOSCOPY (EGD);  Surgeon: Rogene Houston, MD;  [facility];  Service: Endoscopy;  Laterality: N/A;  1240   ESOPHAGOGASTRODUODENOSCOPY (EGD) WITH PROPOFOL N/A 11/09/2019   Procedure: ESOPHAGOGASTRODUODENOSCOPY (EGD) WITH PROPOFOL;  Surgeon: Harvel Quale, MD;  [facility];  Service: Gastroenterology;  Laterality: N/A;   HEMORRHOID SURGERY     POLYPECTOMY  11/09/2019   Procedure: POLYPECTOMY;  Surgeon: Harvel Quale, MD;  [facility];  Service: Gastroenterology;;   POLYPECTOMY  02/27/2021   Procedure: POLYPECTOMY;  Surgeon: Harvel Quale, MD;  [facility];  Service: Gastroenterology;;   TONSILLECTOMY     Allergies  Allergen Reactions   Morphine And Related Nausea And Vomiting   Prior to Admission medications   Medication Sig Start Date End Date Taking? Authorizing Provider  ACCU-CHEK AVIVA PLUS test strip USE AS INSTRUCTED 06/13/19  Yes Corum, Rex Kras, MD  aspirin EC 81 MG tablet Take 81 mg by mouth daily.   Yes [provider]  atorvastatin (LIPITOR) 80 MG tablet Take 1 tablet (80 mg total) by mouth daily. 10/22/21  Yes Wendie Agreste, MD  Calcium-Magnesium-Zinc (CAL-MAG-ZINC PO) Take 1 tablet by mouth daily.    Yes [provider]  Flaxseed, Linseed, (FLAXSEED OIL) 1000 MG CAPS Take 1,000 mg by mouth daily.   Yes [provider]  furosemide (LASIX) 40 MG tablet Take 1 tablet (40 mg total) by mouth daily. 03/17/21  Yes Branch, Alphonse Guild, MD  gabapentin (NEURONTIN) 300 MG capsule Take 1-2 capsules (300-600 mg total) by mouth 2 (two) times daily. 10/22/21  Yes Wendie Agreste, MD  lisinopril (ZESTRIL) 20 MG tablet Take 1 tablet (20 mg total) by mouth daily. 02/23/21 10/16/78 Yes Wendie Agreste, MD  magnesium oxide (MAG-OX) 400 MG tablet Take 400 mg by mouth at bedtime.   Yes [provider]  metFORMIN (GLUCOPHAGE) 500 MG tablet TAKE 2 TABLETS BY MOUTH TWICE  DAILY 10/12/21  Yes Wendie Agreste, MD  metoprolol succinate (TOPROL-XL) 25 MG 24 hr tablet TAKE 1 TABLET BY MOUTH EVERYDAY AT BEDTIME 06/19/21  Yes Branch, Alphonse Guild, MD  Multiple Vitamin (MULTIVITAMIN WITH MINERALS) TABS Take 1 tablet by mouth daily.   Yes [provider]  nitroGLYCERIN (NITROSTAT) 0.4 MG SL tablet Place 1 tablet (0.4 mg total) under the tongue every 5 (five) minutes x 3 doses as needed for chest pain (if no  relief after 2nd dose, proceed to ED for an evaluation). 10/15/21  Yes BranchAlphonse Guild, MD  chlorhexidine (HIBICLENS) 4 % external liquid Apply topically daily as needed. Patient not taking: Reported on 12/17/2021 08/14/21   Volney American, PA-C  mupirocin ointment (BACTROBAN) 2 % Apply 1 application. topically 2 (two) times daily. Patient not taking: Reported on 12/17/2021 08/14/21   Volney American, PA-C  omeprazole (PRILOSEC) 40 MG capsule Take 1 capsule (40 mg total) by mouth daily. Patient not taking: Reported on 12/17/2021 03/17/21   Harvel Quale, MD  varenicline (CHANTIX CONTINUING MONTH PAK) 1 MG tablet Take 1 tablet (1 mg total) by mouth 2 (two) times daily. Patient not taking: Reported on 10/22/2021 08/20/21   Wendie Agreste, MD  Varenicline Tartrate, Starter,  (CHANTIX STARTING MONTH PAK) 0.5 MG X 11 & 1 MG X 42 TBPK Take one 0.5 mg tablet by mouth once daily for 3 days, then increase to one 0.5 mg tablet twice daily for 4 days, then increase to one 1 mg tablet twice daily. Patient not taking: Reported on 10/22/2021 08/20/21   Wendie Agreste, MD   Social History   Socioeconomic History   Marital status: Married    Spouse name: Not on file   Number of children: Not on file   Years of education: Not on file   Highest education level: Not on file  Occupational History   Not on file  Tobacco Use   Smoking status: Every Day    Packs/day: 1.00    Years: 50.00    Total pack years: 50.00    Types: Cigarettes    Start date: 11/26/1967   Smokeless tobacco: Former   Tobacco comments:    smoking x 50 yrs  Vaping Use   Vaping Use: Never used  Substance and Sexual Activity   Alcohol use: Yes    Alcohol/week: 1.0 standard drink of alcohol    Types: 1 Standard drinks or equivalent per week    Comment: rare   Drug use: Yes    Frequency: 1.0 times per week    Types: Marijuana    Comment: as needed   Sexual activity: Yes  Other Topics Concern   Not on file  Social History Narrative   Lives with wife   Right Handed   Drinks 2-3 cups caffeine daily   Social Determinants of Health   Financial Resource Strain: Not on file  Food Insecurity: Not on file  Transportation Needs: Not on file  Physical Activity: Not on file  Stress: Not on file  Social Connections: Not on file  Intimate Partner Violence: Not on file    Review of Systems Per  HPI   Objective:   Vitals:   12/17/21 1116  BP: (!) 130/58  Pulse: 63  Temp: 98.8 F (37.1 C)  SpO2: 98%  Weight: 182 lb 12.8 oz (82.9 kg)  Height: '5\' 6"'$  (1.676 m)     Physical Exam Vitals reviewed.  Constitutional:      General: He is not in acute distress.    Appearance: He is well-developed. He is not ill-appearing, toxic-appearing or diaphoretic.  HENT:     Head: Normocephalic and  atraumatic.     Right Ear: Tympanic membrane, ear canal and external ear normal.     Left Ear: Tympanic membrane, ear canal and external ear normal.     Nose: No rhinorrhea.     Mouth/Throat:     Pharynx: No oropharyngeal exudate or posterior  oropharyngeal erythema.  Eyes:     Conjunctiva/sclera: Conjunctivae normal.     Pupils: Pupils are equal, round, and reactive to light.  Cardiovascular:     Rate and Rhythm: Normal rate and regular rhythm.     Heart sounds: Normal heart sounds. No murmur heard. Pulmonary:     Effort: Pulmonary effort is normal. No respiratory distress.     Breath sounds: Normal breath sounds. No wheezing, rhonchi or rales.  Abdominal:     Palpations: Abdomen is soft.     Tenderness: There is no abdominal tenderness.  Musculoskeletal:     Cervical back: Neck supple.  Lymphadenopathy:     Cervical: No cervical adenopathy.  Skin:    General: Skin is warm and dry.     Findings: No rash.  Neurological:     Mental Status: He is alert and oriented to person, place, and time.  Psychiatric:        Behavior: Behavior normal.     Results for orders placed or performed in visit on 12/17/21  POC COVID-19  Result Value Ref Range   SARS Coronavirus 2 Ag Positive (A) Negative      Assessment & Plan:  Jonathan Gilmore is a 71 y.o. male . Cough in adult - Plan: POC COVID-19, nirmatrelvir/ritonavir EUA (PAXLOVID) 20 x 150 MG & 10 x '100MG'$  TABS  COVID-19 virus infection - Plan: nirmatrelvir/ritonavir EUA (PAXLOVID) 20 x 150 MG & 10 x '100MG'$  TABS COVID-19 infection, day 5 based on symptoms.  Stable, appears to be stable to continue home treatment.  With his medical history, we discussed antivirals including potential side effects, risks and risk of rebound COVID infection as well as treatment if that were to occur.  He would like to try to take antivirals.  Last statin dose earlier in the morning, wait 12 hours after that dose and then start Paxlovid.  Full dose based on his  last renal function.  Avoid statin for 1 week.  Other symptomatic care discussed with Tylenol, Mucinex, fluids, and RTC/ER precautions given.  Isolation followed by masking precautions discussed per CDC.  Mask and face shield worn during visit.   Meds ordered this encounter  Medications   nirmatrelvir/ritonavir EUA (PAXLOVID) 20 x 150 MG & 10 x '100MG'$  TABS    Sig: Take 3 tablets by mouth 2 (two) times daily for 5 days. (Take nirmatrelvir 150 mg two tablets twice daily for 5 days and ritonavir 100 mg one tablet twice daily for 5 days) Patient GFR is 90    Dispense:  30 tablet    Refill:  0   Patient Instructions  Start paxlovid. Stop cholesterol med for 1 week. Do not start paxlovid until at least 12 hours after last dose of Lipitor.  Return to the clinic or go to the nearest emergency room if any of your symptoms worsen or new symptoms occur. Continue to drink sufficient fluids and rest. Tylenol if needed for body aches or fever. Mucinex if needed for cough.   Everyone who has presumed or confirmed COVID-19 should stay home and isolate from other people for at least 5 full days (day 0 is the first day of symptoms or the date of the day of the positive viral test for asymptomatic persons). You can end isolation after 5 full days if you are fever-free for 24 hours without the use of fever-reducing medication and your other symptoms have improved (Loss of taste and smell may persist for weeks or months after recovery  and need not delay the end of isolation). You should continue to wear a well-fitting mask around others at home and in public for 5 additional days (day 6 through day 10) after the end of your 5-day isolation period. If you are unable to wear a mask when around others, you should continue to isolate for a full 10 days. Avoid people who have weakened immune systems or are more likely to get very sick from COVID-19, and nursing homes and other high-risk settings, until after at least 10  days.  If you continue to have fever or your other symptoms have not improved after 5 days of isolation, you should wait to end your isolation until you are fever-free for 24 hours without the use of fever-reducing medication and your other symptoms have improved. Continue to wear a well-fitting mask through day 10.   [email or web address].[email or web address]     Signed,   Merri Ray, MD [place] 12/17/21 12:07 PM

## 2021-12-17 NOTE — Patient Instructions (Addendum)
Start paxlovid. Stop cholesterol med for 1 week. Do not start paxlovid until at least 12 hours after last dose of Lipitor.  Return to the clinic or go to the nearest emergency room if any of your symptoms worsen or new symptoms occur. Continue to drink sufficient fluids and rest. Tylenol if needed for body aches or fever. Mucinex if needed for cough.   Everyone who has presumed or confirmed COVID-19 should stay home and isolate from other people for at least 5 full days (day 0 is the first day of symptoms or the date of the day of the positive viral test for asymptomatic persons). You can end isolation after 5 full days if you are fever-free for 24 hours without the use of fever-reducing medication and your other symptoms have improved (Loss of taste and smell may persist for weeks or months after recovery and need not delay the end of isolation). You should continue to wear a well-fitting mask around others at home and in public for 5 additional days (day 6 through day 10) after the end of your 5-day isolation period. If you are unable to wear a mask when around others, you should continue to isolate for a full 10 days. Avoid people who have weakened immune systems or are more likely to get very sick from COVID-19, and nursing homes and other high-risk settings, until after at least 10 days.  If you continue to have fever or your other symptoms have not improved after 5 days of isolation, you should wait to end your isolation until you are fever-free for 24 hours without the use of fever-reducing medication and your other symptoms have improved. Continue to wear a well-fitting mask through day 10.   [email or web address].[email or web address]

## 2022-01-04 ENCOUNTER — Telehealth: Payer: Self-pay | Admitting: Family Medicine

## 2022-01-04 NOTE — Telephone Encounter (Signed)
Left message for patient to call back and schedule Medicare Annual Wellness Visit (AWV).   Please offer to do virtually or by telephone.  Left office number and my jabber [phone].  AWVI eligible as of 11/13/2016  Please schedule at anytime with Nurse Health Advisor.

## 2022-01-06 ENCOUNTER — Other Ambulatory Visit: Payer: Self-pay | Admitting: Cardiology

## 2022-01-06 ENCOUNTER — Other Ambulatory Visit: Payer: Self-pay | Admitting: Family Medicine

## 2022-01-06 DIAGNOSIS — F17210 Nicotine dependence, cigarettes, uncomplicated: Secondary | ICD-10-CM

## 2022-01-20 ENCOUNTER — Ambulatory Visit (INDEPENDENT_AMBULATORY_CARE_PROVIDER_SITE_OTHER): Payer: Medicare Other | Admitting: *Deleted

## 2022-01-20 DIAGNOSIS — Z0000 Encounter for general adult medical examination without abnormal findings: Secondary | ICD-10-CM

## 2022-01-20 NOTE — Progress Notes (Signed)
Subjective:   Jonathan Gilmore is a 71 y.o. male who presents for Medicare Annual/Subsequent preventive examination.  I connected with  Jonathan Gilmore on 01/20/22 by a telephone enabled telemedicine application and verified that I am speaking with the correct person using two identifiers.   I discussed the limitations of evaluation and management by telemedicine. The patient expressed understanding and agreed to proceed.  Patient location: home  Provider location: in office-telephone    Review of Systems     Cardiac Risk Factors include: advanced age (>77mn, >>68women);diabetes mellitus;male gender;smoking/ tobacco exposure;obesity (BMI >30kg/m2);hypertension     Objective:    Today's Vitals   01/20/22 1259  PainSc: 9    There is no height or weight on file to calculate BMI.     01/20/2022    1:00 PM 02/27/2021    6:28 AM 02/25/2021   10:12 AM 11/09/2019    7:55 AM 06/23/2019    2:23 PM 01/16/2018    9:33 AM 04/22/2017    7:06 AM  Advanced Directives  Does Patient Have a Medical Advance Directive? Yes No Yes No No Yes No  Type of AParamedicof [place]art[place]Living will  Living will;Healthcare Power of Attorney   Living will   Does patient want to make changes to medical advance directive?   No - Patient declined   No - Patient declined   Copy of [place] Chart? Yes - validated most recent copy scanned in chart (See row information)  No - copy requested      Would patient like information on creating a medical advance directive?    No - Patient declined No - Patient declined  No - Patient declined    Current Medications (verified) Outpatient Encounter Medications as of 01/20/2022  Medication Sig   ACCU-CHEK AVIVA PLUS test strip USE AS INSTRUCTED   aspirin EC 81 MG tablet Take 81 mg by mouth daily.   atorvastatin (LIPITOR) 80 MG tablet Take 1 tablet (80 mg total) by mouth daily.   Calcium-Magnesium-Zinc (CAL-MAG-ZINC PO) Take 1  tablet by mouth daily.   Flaxseed, Linseed, (FLAXSEED OIL) 1000 MG CAPS Take 1,000 mg by mouth daily.   furosemide (LASIX) 40 MG tablet TAKE 1 TABLET BY MOUTH DAILY   gabapentin (NEURONTIN) 300 MG capsule Take 1-2 capsules (300-600 mg total) by mouth 2 (two) times daily.   lisinopril (ZESTRIL) 20 MG tablet Take 1 tablet (20 mg total) by mouth daily.   magnesium oxide (MAG-OX) 400 MG tablet Take 400 mg by mouth at bedtime.   metFORMIN (GLUCOPHAGE) 500 MG tablet TAKE 2 TABLETS BY MOUTH TWICE  DAILY   metoprolol succinate (TOPROL-XL) 25 MG 24 hr tablet TAKE 1 TABLET BY MOUTH DAILY AT  BEDTIME   Multiple Vitamin (MULTIVITAMIN WITH MINERALS) TABS Take 1 tablet by mouth daily.   nitroGLYCERIN (NITROSTAT) 0.4 MG SL tablet Place 1 tablet (0.4 mg total) under the tongue every 5 (five) minutes x 3 doses as needed for chest pain (if no relief after 2nd dose, proceed to ED for an evaluation).   varenicline (CHANTIX) 1 MG tablet TAKE 1 TABLET BY MOUTH TWICE DAILY   chlorhexidine (HIBICLENS) 4 % external liquid Apply topically daily as needed. (Patient not taking: Reported on 12/17/2021)   mupirocin ointment (BACTROBAN) 2 % Apply 1 application. topically 2 (two) times daily. (Patient not taking: Reported on 12/17/2021)   omeprazole (PRILOSEC) 40 MG capsule Take 1 capsule (40 mg total) by mouth  daily. (Patient not taking: Reported on 12/17/2021)   Varenicline Tartrate, Starter, (CHANTIX STARTING MONTH PAK) 0.5 MG X 11 & 1 MG X 42 TBPK Take one 0.5 mg tablet by mouth once daily for 3 days, then increase to one 0.5 mg tablet twice daily for 4 days, then increase to one 1 mg tablet twice daily. (Patient not taking: Reported on 10/22/2021)   No facility-administered encounter medications on file as of 01/20/2022.    Allergies (verified) Morphine and related   History: Past Medical History:  Diagnosis Date   Abnormal cardiovascular stress test 03/2013   Anxiety    Arthritis    Back pain    CHF (congestive heart  failure) (HCC)    Coronary artery disease    Multivessel with significant LAD involvement by chest CT 2014   Depression    Diastolic dysfunction    Essential hypertension    Lumbar herniated disc    L4-L5   MRSA (methicillin resistant staph aureus) culture positive    Neuropathy    Shortness of breath dyspnea    Sleep apnea    No cpap   Type 2 diabetes mellitus ([place])    Past Surgical History:  Procedure Laterality Date   ANTERIOR CERVICAL DECOMP/DISCECTOMY FUSION  01/18/2011   Procedure: ANTERIOR CERVICAL DECOMPRESSION/DISCECTOMY FUSION 2 LEVELS;  Surgeon: Cooper Render Pool;  Location: [facility];  Service: Neurosurgery;  Laterality: N/A;  anterior cervical discectomy and fusion with allograft and plating, cervical five-six, cervical six-seven   BIOPSY N/A 04/04/2015   Procedure: BIOPSY;  Surgeon: Rogene Houston, MD;  [facility];  Service: Endoscopy;  Laterality: N/A;   BIOPSY  11/09/2019   Procedure: BIOPSY;  Surgeon: Harvel Quale, MD;  [facility];  Service: Gastroenterology;;   Bone spur removed  1997   Left shoulder   CARDIAC CATHETERIZATION N/A 08/08/2015   Procedure: Right/Left Heart Cath and Coronary Angiography;  Surgeon: Sherren Mocha, MD;  Location: [facility];  Service: Cardiovascular;  Laterality: N/A;   CARPAL TUNNEL RELEASE Right    COLONOSCOPY WITH PROPOFOL N/A 11/09/2019   Procedure: COLONOSCOPY WITH PROPOFOL;  Surgeon: Harvel Quale, MD;  [facility];  Service: Gastroenterology;  Laterality: N/A;  900   COLONOSCOPY WITH PROPOFOL N/A 02/27/2021   Procedure: COLONOSCOPY WITH PROPOFOL;  Surgeon: Harvel Quale, MD;  [facility];  Service: Gastroenterology;  Laterality: N/A;  7:30   ESOPHAGEAL DILATION N/A 04/04/2015   Procedure: ESOPHAGEAL DILATION;  Surgeon: Rogene Houston, MD;  [facility];  Service: Endoscopy;  Laterality: N/A;   ESOPHAGEAL DILATION N/A 11/09/2019    Procedure: ESOPHAGEAL DILATION;  Surgeon: Harvel Quale, MD;  [facility];  Service: Gastroenterology;  Laterality: N/A;   ESOPHAGOGASTRODUODENOSCOPY N/A 04/04/2015   Procedure: ESOPHAGOGASTRODUODENOSCOPY (EGD);  Surgeon: Rogene Houston, MD;  [facility];  Service: Endoscopy;  Laterality: N/A;  1240   ESOPHAGOGASTRODUODENOSCOPY (EGD) WITH PROPOFOL N/A 11/09/2019   Procedure: ESOPHAGOGASTRODUODENOSCOPY (EGD) WITH PROPOFOL;  Surgeon: Harvel Quale, MD;  [facility];  Service: Gastroenterology;  Laterality: N/A;   HEMORRHOID SURGERY     POLYPECTOMY  11/09/2019   Procedure: POLYPECTOMY;  Surgeon: Harvel Quale, MD;  [facility];  Service: Gastroenterology;;   POLYPECTOMY  02/27/2021   Procedure: POLYPECTOMY;  Surgeon: Harvel Quale, MD;  [facility];  Service: Gastroenterology;;   TONSILLECTOMY     Family History  Problem  Relation Age of Onset   Lung cancer Mother    Diabetes Father    Hepatitis B Father    Diabetes Sister    Cushing syndrome Sister    Social History   Socioeconomic History   Marital status: Married    Spouse name: Not on file   Number of children: Not on file   Years of education: Not on file   Highest education level: Not on file  Occupational History   Not on file  Tobacco Use   Smoking status: Every Day    Packs/day: 1.00    Years: 50.00    Total pack years: 50.00    Types: Cigarettes    Start date: 11/26/1967   Smokeless tobacco: Former   Tobacco comments:    smoking x 50 yrs  Vaping Use   Vaping Use: Never used  Substance and Sexual Activity   Alcohol use: Yes    Alcohol/week: 1.0 standard drink of alcohol    Types: 1 Standard drinks or equivalent per week    Comment: rare   Drug use: Yes    Frequency: 1.0 times per week    Types: Marijuana    Comment: as needed   Sexual activity: Yes  Other Topics Concern   Not on file  Social History  Narrative   Lives with wife   Right Handed   Drinks 2-3 cups caffeine daily   Social Determinants of Health   Financial Resource Strain: Low Risk  (01/20/2022)   Overall Financial Resource Strain (CARDIA)    Difficulty of Paying Living Expenses: Not hard at all  Food Insecurity: No Food Insecurity (01/20/2022)   Hunger Vital Sign    Worried About Running Out of Food in the Last Year: Never true    Ran Out of Food in the Last Year: Never true  Transportation Needs: No Transportation Needs (01/20/2022)   PRAPARE - Hydrologist (Medical): No    Lack of Transportation (Non-Medical): No  Physical Activity: Inactive (01/20/2022)   Exercise Vital Sign    Days of Exercise per Week: 0 days    Minutes of Exercise per Session: 0 min  Stress: No Stress Concern Present (01/20/2022)   [place]    Feeling of Stress : Not at all  Social Connections: Moderately Integrated (01/20/2022)   Social Connection and Isolation Panel [NHANES]    Frequency of Communication with Friends and Family: More than three times a week    Frequency of Social Gatherings with Friends and Family: More than three times a week    Attends Religious Services: Never    Marine scientist or Organizations: No    Attends Music therapist: More than 4 times per year    Marital Status: Married    Tobacco Counseling Ready to quit: Not Answered Counseling given: Not Answered Tobacco comments: smoking x 50 yrs   Clinical Intake:  Pre-visit preparation completed: Yes  Pain : 0-10 Pain Score: 9  Pain Type: Chronic pain Pain Location: Leg Pain Orientation: Right, Left Pain Descriptors / Indicators: Burning, Aching, Constant, Discomfort Pain Onset: More than a month ago Pain Frequency: Constant Pain Relieving Factors: gabepentin  Pain Relieving Factors: gabepentin  Diabetes: Yes CBG done?: No Did pt. bring in  CBG monitor from home?: No  How often do you need to have someone help you when you read instructions, pamphlets, or other written materials from your doctor or  pharmacy?: 1 - Never  Diabetic?  Yes  Nutrition Risk Assessment:  Has the patient had any N/V/D within the last 2 months?  No  Does the patient have any non-healing wounds?  No  Has the patient had any unintentional weight loss or weight gain?  No   Diabetes:  Is the patient diabetic?  Yes  If diabetic, was a CBG obtained today?  No  Did the patient bring in their glucometer from home?  No  How often do you monitor your CBG's? 3 x a day.   Financial Strains and Diabetes Management:  Are you having any financial strains with the device, your supplies or your medication? No .  Does the patient want to be seen by Chronic Care Management for management of their diabetes?  No  Would the patient like to be referred to a Nutritionist or for Diabetic Management?  No   Diabetic Exams:  Diabetic Eye Exam: Completed Pt has been advised about the importance in completing this exam.  Diabetic Foot Exam:  Pt has been advised about the importance in completing this exam.   Interpreter Needed?: No  Information entered by :: Leroy Kennedy LPN   Activities of Daily Living    01/20/2022    1:14 PM 02/25/2021   10:14 AM  In your present state of health, do you have any difficulty performing the following activities:  Hearing? 1   Vision? 0   Difficulty concentrating or making decisions? 0   Walking or climbing stairs? 1   Dressing or bathing? 0   Doing errands, shopping? 0 0  Preparing Food and eating ? N   Using the Toilet? N   In the past six months, have you accidently leaked urine? N   Do you have problems with loss of bowel control? N   Managing your Medications? N   Managing your Finances? N   Housekeeping or managing your Housekeeping? N     Patient Care Team: Wendie Agreste, MD as PCP - General (Family  Medicine) Harl Bowie Alphonse Guild, MD as PCP - Cardiology (Cardiology)  Indicate any recent Medical Services you may have received from other than [facility] providers in the past year (date may be approximate).     Assessment:   This is a routine wellness examination for Jonathan Gilmore.  Hearing/Vision screen Hearing Screening - Comments:: Left ear hearing loss  - no hearing aid Has had it check nothing can do  Vision Screening - Comments:: Up to date [facility]  Dietary issues and exercise activities discussed: Current Exercise Habits: The patient does not participate in regular exercise at present   Goals Addressed             This Visit's Progress    Patient Stated       Put a 46 impala back on rode next year       Depression Screen    01/20/2022    1:09 PM 12/17/2021   11:15 AM 10/22/2021   11:38 AM 08/20/2021   10:53 AM 05/25/2021   10:45 AM 02/23/2021    1:19 PM 12/01/2020    1:57 PM  PHQ 2/9 Scores  PHQ - 2 Score 0 0 0 3 2 0 0  PHQ- 9 Score 4 0  '13 12 6 1    '$ Fall Risk    01/20/2022    1:00 PM 12/17/2021   11:15 AM 10/22/2021   11:38 AM 08/20/2021   10:53 AM 05/25/2021   10:45 AM  Fall Risk   Falls in the past year? 0 0 1 0 0  Number falls in past yr: 0 0 0 0 0  Injury with Fall? 0 0 0 0 0  Risk for fall due to :  No Fall Risks History of fall(s) No Fall Risks No Fall Risks  Follow up Falls evaluation completed;Education provided;Falls prevention discussed Falls evaluation completed Falls evaluation completed Falls evaluation completed Falls evaluation completed    FALL RISK PREVENTION PERTAINING TO THE HOME:  Any stairs in or around the home? No  If so, are there any without handrails? No  Home free of loose throw rugs in walkways, pet beds, electrical cords, etc? Yes  Adequate lighting in your home to reduce risk of falls? Yes   ASSISTIVE DEVICES UTILIZED TO PREVENT FALLS:  Life alert? No  Use of a cane, walker or w/c? Yes  Grab bars in the bathroom? No  Shower  chair or bench in shower? No  Elevated toilet seat or a handicapped toilet? Yes   TIMED UP AND GO:  Was the test performed? No .    Cognitive Function:    11/03/2020    1:15 PM  MMSE - Mini Mental State Exam  Orientation to time 5  Orientation to Place 5  Registration 3  Attention/ Calculation 5  Recall 3  Language- name 2 objects 2  Language- repeat 0  Language- follow 3 step command 3  Language- read & follow direction 1  Write a sentence 0  Copy design 0  Total score 27        01/20/2022    1:06 PM  6CIT Screen  What Year? 0 points  What month? 0 points  What time? 0 points  Count back from 20 0 points  Months in reverse 0 points  Repeat phrase 0 points  Total Score 0 points    Immunizations Immunization History  Administered Date(s) Administered   Fluad Quad(high Dose 65+) 11/29/2018   Influenza Split 01/19/2011   Influenza,inj,Quad PF,6+ Mos 02/25/2013   Moderna Sars-Covid-2 Vaccination 04/19/2019, 05/18/2019, 02/27/2020   Pneumococcal Conjugate-13 11/29/2018   Pneumococcal Polysaccharide-23 02/25/2013, 10/22/2021   Tdap 08/13/2020    TDAP status: Up to date  Flu Vaccine status: Due, Education has been provided regarding the importance of this vaccine. Advised may receive this vaccine at local pharmacy or Health Dept. Aware to provide a copy of the vaccination record if obtained from local pharmacy or Health Dept. Verbalized acceptance and understanding.  Pneumococcal vaccine status: Up to date  Covid-19 vaccine status: Information provided on how to obtain vaccines.   Qualifies for Shingles Vaccine? Yes   Zostavax completed No   Shingrix Completed?: No.    Education has been provided regarding the importance of this vaccine. Patient has been advised to call insurance company to determine out of pocket expense if they have not yet received this vaccine. Advised may also receive vaccine at local pharmacy or Health Dept. Verbalized acceptance and  understanding.  Screening Tests Health Maintenance  Topic Date Due   FOOT EXAM  12/01/2021   Zoster Vaccines- Shingrix (1 of 2) 01/22/2022 (Originally 11/25/1969)   COVID-19 Vaccine (4 - Moderna risk series) 02/05/2022 (Originally 04/23/2020)   Hepatitis C Screening  02/23/2022 (Originally 11/25/1968)   OPHTHALMOLOGY EXAM  02/25/2022 (Originally 09/11/2021)   INFLUENZA VACCINE  06/13/2022 (Originally 10/13/2021)   HEMOGLOBIN A1C  04/24/2022   Diabetic kidney evaluation - Urine ACR  08/21/2022   Diabetic kidney evaluation - GFR measurement  10/23/2022   Medicare Annual Wellness (AWV)  01/21/2023   TETANUS/TDAP  08/14/2030   COLONOSCOPY (Pts 45-75yr Insurance coverage will need to be confirmed)  02/28/2031   Pneumonia Vaccine 71 Years old  Completed   HPV VACCINES  Aged Out   Lung Cancer Screening  Discontinued    Health Maintenance  Health Maintenance Due  Topic Date Due   FOOT EXAM  12/01/2021    Colorectal cancer screening: Type of screening: Colonoscopy. Completed 2022. Repeat every 10 years  Lung Cancer Screening: (Low Dose CT Chest recommended if Age 71-80years, 30 pack-year currently smoking OR have quit w/in 15years.)  patient states he does not spoke 30 pack a year .     Lung Cancer Screening Referral:   Additional Screening:  Hepatitis C Screening: does qualify;  Vision Screening: Recommended annual ophthalmology exams for early detection of glaucoma and other disorders of the eye. Is the patient up to date with their annual eye exam?  Yes  Who is the provider or what is the name of the office in which the patient attends annual eye exams? [facility] If pt is not established with a provider, would they like to be referred to a provider to establish care? No .   Dental Screening: Recommended annual dental exams for proper oral hygiene  Community Resource Referral / Chronic Care Management: CRR required this visit?  No   CCM required this visit?  No       Plan:     I have personally reviewed and noted the following in the patient's chart:   Medical and social history Use of alcohol, tobacco or illicit drugs  Current medications and supplements including opioid prescriptions. Patient is not currently taking opioid prescriptions. Functional ability and status Nutritional status Physical activity Advanced directives List of other physicians Hospitalizations, surgeries, and ER visits in previous 12 months Vitals Screenings to include cognitive, depression, and falls Referrals and appointments  In addition, I have reviewed and discussed with patient certain preventive protocols, quality metrics, and best practice recommendations. A written personalized care plan for preventive services as well as general preventive health recommendations were provided to patient.     JLeroy Kennedy LPN   195/05/2021  Nurse Notes:

## 2022-01-20 NOTE — Patient Instructions (Signed)
Jonathan Gilmore , Thank you for taking time to come for your Medicare Wellness Visit. I appreciate your ongoing commitment to your health goals. Please review the following plan we discussed and let me know if I can assist you in the future.   These are the goals we discussed:  Goals      Patient Stated     Put a 24 impala back on rode next year        This is a list of the screening recommended for you and due dates:  Health Maintenance  Topic Date Due   Screening for Lung Cancer  02/24/2014   COVID-19 Vaccine (4 - Moderna risk series) 04/23/2020   Complete foot exam   12/01/2021   Zoster (Shingles) Vaccine (1 of 2) 01/22/2022*   Hepatitis C Screening: USPSTF Recommendation to screen - Ages 18-79 yo.  02/23/2022*   Eye exam for diabetics  02/25/2022*   Flu Shot  06/13/2022*   Hemoglobin A1C  04/24/2022   Yearly kidney health urinalysis for diabetes  08/21/2022   Yearly kidney function blood test for diabetes  10/23/2022   Medicare Annual Wellness Visit  01/21/2023   Tetanus Vaccine  08/14/2030   Colon Cancer Screening  02/28/2031   Pneumonia Vaccine  Completed   HPV Vaccine  Aged Out  *Topic was postponed. The date shown is not the original due date.    Advanced directives: yes   Conditions/risks identified:    Preventive Care 4 Years and Older, Male  Preventive care refers to lifestyle choices and visits with your health care provider that can promote health and wellness. What does preventive care include? A yearly physical exam. This is also called an annual well check. Dental exams once or twice a year. Routine eye exams. Ask your health care provider how often you should have your eyes checked. Personal lifestyle choices, including: Daily care of your teeth and gums. Regular physical activity. Eating a healthy diet. Avoiding tobacco and drug use. Limiting alcohol use. Practicing safe sex. Taking low doses of aspirin every day. Taking vitamin and mineral supplements  as recommended by your health care provider. What happens during an annual well check? The services and screenings done by your health care provider during your annual well check will depend on your age, overall health, lifestyle risk factors, and family history of disease. Counseling  Your health care provider may ask you questions about your: Alcohol use. Tobacco use. Drug use. Emotional well-being. Home and relationship well-being. Sexual activity. Eating habits. History of falls. Memory and ability to understand (cognition). Work and work Statistician. Screening  You may have the following tests or measurements: Height, weight, and BMI. Blood pressure. Lipid and cholesterol levels. These may be checked every 5 years, or more frequently if you are over 17 years old. Skin check. Lung cancer screening. You may have this screening every year starting at age 24 if you have a 30-pack-year history of smoking and currently smoke or have quit within the past 15 years. Fecal occult blood test (FOBT) of the stool. You may have this test every year starting at age 53. Flexible sigmoidoscopy or colonoscopy. You may have a sigmoidoscopy every 5 years or a colonoscopy every 10 years starting at age 43. Prostate cancer screening. Recommendations will vary depending on your family history and other risks. Hepatitis C blood test. Hepatitis B blood test. Sexually transmitted disease (STD) testing. Diabetes screening. This is done by checking your blood sugar (glucose) after you have not  eaten for a while (fasting). You may have this done every 1-3 years. Abdominal aortic aneurysm (AAA) screening. You may need this if you are a current or former smoker. Osteoporosis. You may be screened starting at age 9 if you are at high risk. Talk with your health care provider about your test results, treatment options, and if necessary, the need for more tests. Vaccines  Your health care provider may recommend  certain vaccines, such as: Influenza vaccine. This is recommended every year. Tetanus, diphtheria, and acellular pertussis (Tdap, Td) vaccine. You may need a Td booster every 10 years. Zoster vaccine. You may need this after age 40. Pneumococcal 13-valent conjugate (PCV13) vaccine. One dose is recommended after age 39. Pneumococcal polysaccharide (PPSV23) vaccine. One dose is recommended after age 55. Talk to your health care provider about which screenings and vaccines you need and how often you need them. This information is not intended to replace advice given to you by your health care provider. Make sure you discuss any questions you have with your health care provider. Document Released: 03/28/2015 Document Revised: 11/19/2015 Document Reviewed: 12/31/2014 Elsevier Interactive Patient Education  2017 [place] Prevention in the Home Falls can cause injuries. They can happen to people of all ages. There are many things you can do to make your home safe and to help prevent falls. What can I do on the outside of my home? Regularly fix the edges of walkways and driveways and fix any cracks. Remove anything that might make you trip as you walk through a door, such as a raised step or threshold. Trim any bushes or trees on the path to your home. Use bright outdoor lighting. Clear any walking paths of anything that might make someone trip, such as rocks or tools. Regularly check to see if handrails are loose or broken. Make sure that both sides of any steps have handrails. Any raised decks and porches should have guardrails on the edges. Have any leaves, snow, or ice cleared regularly. Use sand or salt on walking paths during winter. Clean up any spills in your garage right away. This includes oil or grease spills. What can I do in the bathroom? Use night lights. Install grab bars by the toilet and in the tub and shower. Do not use towel bars as grab bars. Use non-skid mats or  decals in the tub or shower. If you need to sit down in the shower, use a plastic, non-slip stool. Keep the floor dry. Clean up any water that spills on the floor as soon as it happens. Remove soap buildup in the tub or shower regularly. Attach bath mats securely with double-sided non-slip rug tape. Do not have throw rugs and other things on the floor that can make you trip. What can I do in the bedroom? Use night lights. Make sure that you have a light by your bed that is easy to reach. Do not use any sheets or blankets that are too big for your bed. They should not hang down onto the floor. Have a firm chair that has side arms. You can use this for support while you get dressed. Do not have throw rugs and other things on the floor that can make you trip. What can I do in the kitchen? Clean up any spills right away. Avoid walking on wet floors. Keep items that you use a lot in easy-to-reach places. If you need to reach something above you, use a strong step stool that  has a grab bar. Keep electrical cords out of the way. Do not use floor polish or wax that makes floors slippery. If you must use wax, use non-skid floor wax. Do not have throw rugs and other things on the floor that can make you trip. What can I do with my stairs? Do not leave any items on the stairs. Make sure that there are handrails on both sides of the stairs and use them. Fix handrails that are broken or loose. Make sure that handrails are as long as the stairways. Check any carpeting to make sure that it is firmly attached to the stairs. Fix any carpet that is loose or worn. Avoid having throw rugs at the top or bottom of the stairs. If you do have throw rugs, attach them to the floor with carpet tape. Make sure that you have a light switch at the top of the stairs and the bottom of the stairs. If you do not have them, ask someone to add them for you. What else can I do to help prevent falls? Wear shoes that: Do not  have high heels. Have rubber bottoms. Are comfortable and fit you well. Are closed at the toe. Do not wear sandals. If you use a stepladder: Make sure that it is fully opened. Do not climb a closed stepladder. Make sure that both sides of the stepladder are locked into place. Ask someone to hold it for you, if possible. Clearly mark and make sure that you can see: Any grab bars or handrails. First and last steps. Where the edge of each step is. Use tools that help you move around (mobility aids) if they are needed. These include: Canes. Walkers. Scooters. Crutches. Turn on the lights when you go into a dark area. Replace any light bulbs as soon as they burn out. Set up your furniture so you have a clear path. Avoid moving your furniture around. If any of your floors are uneven, fix them. If there are any pets around you, be aware of where they are. Review your medicines with your doctor. Some medicines can make you feel dizzy. This can increase your chance of falling. Ask your doctor what other things that you can do to help prevent falls. This information is not intended to replace advice given to you by your health care provider. Make sure you discuss any questions you have with your health care provider. Document Released: 12/26/2008 Document Revised: 08/07/2015 Document Reviewed: 04/05/2014 Elsevier Interactive Patient Education  2017 [employer].

## 2022-01-28 ENCOUNTER — Ambulatory Visit: Payer: Medicare Other | Admitting: Family Medicine

## 2022-01-28 ENCOUNTER — Encounter: Payer: Self-pay | Admitting: Family Medicine

## 2022-01-28 VITALS — BP 128/60 | HR 61 | Temp 98.3°F | Ht 66.0 in | Wt 193.2 lb

## 2022-01-28 DIAGNOSIS — I739 Peripheral vascular disease, unspecified: Secondary | ICD-10-CM

## 2022-01-28 DIAGNOSIS — M79604 Pain in right leg: Secondary | ICD-10-CM | POA: Diagnosis not present

## 2022-01-28 DIAGNOSIS — M545 Low back pain, unspecified: Secondary | ICD-10-CM | POA: Diagnosis not present

## 2022-01-28 DIAGNOSIS — M5116 Intervertebral disc disorders with radiculopathy, lumbar region: Secondary | ICD-10-CM | POA: Diagnosis not present

## 2022-01-28 DIAGNOSIS — M79605 Pain in left leg: Secondary | ICD-10-CM

## 2022-01-28 NOTE — Patient Instructions (Addendum)
Please have xray at [facility] as ordered in August and I will refer you back to Dr. Lorin Mercy. Discuss leg pains, back pain and plans with Dr. Lorin Mercy. Ok to continue gabapentin for now.  I will also refer you back to vascular specialist as circulation may also be part of the leg pain.  If any foot discoloration be seen right away.  Continue to cut back on smoking as that can be one of the main treatments for the circulation. Follow-up in 1 month and we can review your other medications at that time.  Thanks for coming in today and take care.   [facility] in 8:30-4:30 during weekdays, no appointment needed [place].  [place], [place] [id number]

## 2022-01-28 NOTE — Progress Notes (Signed)
Subjective:  Patient ID: Jonathan Gilmore, male    DOB: [birth year]  Age: 71 y.o. MRN: [id number]  CC:  Chief Complaint  Patient presents with   pain in legs    Pt states he has pain in both legs and wants to see about getting a scooter    Depression    PHQ9 - 7    HPI Jonathan Gilmore presents for   Leg pains  We discussed back pain, prior degenerative dz, nighttime leg symptoms depending on amount of walking during the day treated with gabapentin.  Prior injections with only temporary relief and has completed PT previously - those were both years ago.  Gabapentin was helping with sleep when discussed in August.He had seen Dr. Lorin Mercy previously, recommended follow-up to discuss back pain, leg symptoms further.  Reported having MRI with piece of disc seen previously. Has not seen him in a few years. Additionally have ordered a lumbar spine x-ray in August, was not completed.  Has not seen ortho - had some other obligations since last visit. Taking gabapentin - 1 in the morning, 2-3 at night typically, occasionally needs 4 if more activity or certain weather. Helps pain, some sedation with med.   No new weakness, incontinence. Left back pain primarily. Worse on left, trips on left at times, unable to feel feet, using cane. Picking up feet.   Pain in calves with walking, better with rest. Hx of PAD with claudication. Vascular eval in 2019 - note reviewed. Smoking cessation discussed.  Still smoking 5 cigs per day.  Some swelling in ankles, no color changes in feet, no wounds. Did have purple discoloration in both feet few months ago with more activity - not recent.  Some friends with neuropathy use a scooter - he may be interested in this.      History Patient Active Problem List   Diagnosis Date Noted   Prostate cancer ([place]) 06/08/2021   Esophagitis 02/09/2021   History of colonic polyps 02/09/2021   Intestinal metaplasia of antrum of stomach without dysplasia 02/09/2021   Insomnia  03/12/2019   Pure hypercholesterolemia 11/29/2018   Hypogonadism, male 11/23/2018   Essential hypertension, benign 11/13/2018   High risk medication use 10/28/2018   Carotid stenosis, asymptomatic, bilateral 10/28/2018   Tobacco use 10/28/2018   Polyp of colon 10/28/2018   Lumbar disc herniation with radiculopathy 12/21/2017   Spinal stenosis of lumbar region without neurogenic claudication 12/21/2017   CAD -50% LAD  08/09/2015   Moderate aortic stenosis 08/09/2015   Acute pulmonary edema ([place]) 03/88/8280   Acute diastolic CHF (congestive heart failure) ([place]) 08/07/2015   Hypersomnia with sleep apnea, unspecified 09/20/2013   Syncope 09/19/2013   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   OSA (obstructive sleep apnea) -non compliant 09/19/2013   Narcolepsy and cataplexy 09/19/2013   Facial abscess 04/28/2013   Facial cellulitis 04/28/2013   Mixed hyperlipidemia 04/28/2013   Tobacco dependence 04/28/2013   Sepsis ([place]) 04/28/2013   Gout 03/49/1791   Diastolic CHF ([place]) 50/56/9794   Pleuritic pain 02/24/2013   Chest pain 02/24/2013   Murmur 02/24/2013   Pericarditis 02/24/2013   DM type 2 causing vascular disease ([place]) 02/24/2013   Essential hypertension 02/24/2013   Cervical spondylosis with myelopathy 01/19/2011   Past Medical History:  Diagnosis Date   Abnormal cardiovascular stress test 03/2013   Anxiety    Arthritis    Back pain    CHF (congestive heart failure) (HCC)    Coronary artery disease  Multivessel with significant LAD involvement by chest CT 2014   Depression    Diastolic dysfunction    Essential hypertension    Lumbar herniated disc    L4-L5   MRSA (methicillin resistant staph aureus) culture positive    Neuropathy    Shortness of breath dyspnea    Sleep apnea    No cpap   Type 2 diabetes mellitus [facility])    Past Surgical History:  Procedure Laterality Date   ANTERIOR CERVICAL DECOMP/DISCECTOMY FUSION  01/18/2011   Procedure: ANTERIOR CERVICAL  DECOMPRESSION/DISCECTOMY FUSION 2 LEVELS;  Surgeon: Cooper Render Pool;  Location: [place] NEURO ORS;  Service: Neurosurgery;  Laterality: N/A;  anterior cervical discectomy and fusion with allograft and plating, cervical five-six, cervical six-seven   BIOPSY N/A 04/04/2015   Procedure: BIOPSY;  Surgeon: Rogene Houston, MD;  [facility];  Service: Endoscopy;  Laterality: N/A;   BIOPSY  11/09/2019   Procedure: BIOPSY;  Surgeon: Harvel Quale, MD;  [facility];  Service: Gastroenterology;;   Bone spur removed  1997   Left shoulder   CARDIAC CATHETERIZATION N/A 08/08/2015   Procedure: Right/Left Heart Cath and Coronary Angiography;  Surgeon: Sherren Mocha, MD;  Location: [place] CV LAB;  Service: Cardiovascular;  Laterality: N/A;   CARPAL TUNNEL RELEASE Right    COLONOSCOPY WITH PROPOFOL N/A 11/09/2019   Procedure: COLONOSCOPY WITH PROPOFOL;  Surgeon: Harvel Quale, MD;  [facility];  Service: Gastroenterology;  Laterality: N/A;  900   COLONOSCOPY WITH PROPOFOL N/A 02/27/2021   Procedure: COLONOSCOPY WITH PROPOFOL;  Surgeon: Harvel Quale, MD;  [facility];  Service: Gastroenterology;  Laterality: N/A;  7:30   ESOPHAGEAL DILATION N/A 04/04/2015   Procedure: ESOPHAGEAL DILATION;  Surgeon: Rogene Houston, MD;  [facility];  Service: Endoscopy;  Laterality: N/A;   ESOPHAGEAL DILATION N/A 11/09/2019   Procedure: ESOPHAGEAL DILATION;  Surgeon: Harvel Quale, MD;  [facility];  Service: Gastroenterology;  Laterality: N/A;   ESOPHAGOGASTRODUODENOSCOPY N/A 04/04/2015   Procedure: ESOPHAGOGASTRODUODENOSCOPY (EGD);  Surgeon: Rogene Houston, MD;  [facility];  Service: Endoscopy;  Laterality: N/A;  1240   ESOPHAGOGASTRODUODENOSCOPY (EGD) WITH PROPOFOL N/A 11/09/2019   Procedure: ESOPHAGOGASTRODUODENOSCOPY (EGD) WITH PROPOFOL;  Surgeon: Harvel Quale, MD;  [facility];  Service: Gastroenterology;  Laterality: N/A;   HEMORRHOID SURGERY     POLYPECTOMY  11/09/2019   Procedure: POLYPECTOMY;  Surgeon: Harvel Quale, MD;  [facility];  Service: Gastroenterology;;   POLYPECTOMY  02/27/2021   Procedure: POLYPECTOMY;  Surgeon: Harvel Quale, MD;  [facility];  Service: Gastroenterology;;   TONSILLECTOMY     Allergies  Allergen Reactions   Morphine And Related Nausea And Vomiting   Prior to Admission medications   Medication Sig Start Date End Date Taking? Authorizing Provider  ACCU-CHEK AVIVA PLUS test strip USE AS INSTRUCTED 06/13/19   Maryruth Hancock, MD  aspirin EC 81 MG tablet Take 81 mg by mouth daily.    [provider]  atorvastatin (LIPITOR) 80 MG tablet Take 1 tablet (80 mg total) by mouth daily. 10/22/21   Wendie Agreste, MD  Calcium-Magnesium-Zinc (CAL-MAG-ZINC PO) Take 1 tablet by mouth daily.    [provider]  chlorhexidine (HIBICLENS) 4 % external liquid Apply topically daily as needed. Patient not taking: Reported on 12/17/2021 08/14/21   Volney American, PA-C  Flaxseed, Linseed, (FLAXSEED OIL) 1000 MG  CAPS Take 1,000 mg by mouth daily.    [provider]  furosemide (LASIX) 40 MG tablet TAKE 1 TABLET BY MOUTH DAILY 01/07/22   Arnoldo Lenis, MD  gabapentin (NEURONTIN) 300 MG capsule Take 1-2 capsules (300-600 mg total) by mouth 2 (two) times daily. 10/22/21   Wendie Agreste, MD  lisinopril (ZESTRIL) 20 MG tablet Take 1 tablet (20 mg total) by mouth daily. 02/23/21 10/16/78  Wendie Agreste, MD  magnesium oxide (MAG-OX) 400 MG tablet Take 400 mg by mouth at bedtime.    [provider]  metFORMIN (GLUCOPHAGE) 500 MG tablet TAKE 2 TABLETS BY MOUTH TWICE  DAILY 10/12/21   Wendie Agreste, MD  metoprolol succinate (TOPROL-XL) 25 MG 24 hr tablet TAKE 1 TABLET BY MOUTH DAILY AT  BEDTIME 01/07/22   Arnoldo Lenis, MD  Multiple Vitamin (MULTIVITAMIN  WITH MINERALS) TABS Take 1 tablet by mouth daily.    [provider]  mupirocin ointment (BACTROBAN) 2 % Apply 1 application. topically 2 (two) times daily. Patient not taking: Reported on 12/17/2021 08/14/21   Volney American, PA-C  nitroGLYCERIN (NITROSTAT) 0.4 MG SL tablet Place 1 tablet (0.4 mg total) under the tongue every 5 (five) minutes x 3 doses as needed for chest pain (if no relief after 2nd dose, proceed to ED for an evaluation). 10/15/21   Arnoldo Lenis, MD  omeprazole (PRILOSEC) 40 MG capsule Take 1 capsule (40 mg total) by mouth daily. Patient not taking: Reported on 12/17/2021 03/17/21   Harvel Quale, MD  varenicline (CHANTIX) 1 MG tablet TAKE 1 TABLET BY MOUTH TWICE DAILY 01/06/22   Wendie Agreste, MD  Varenicline Tartrate, Starter, (CHANTIX STARTING MONTH PAK) 0.5 MG X 11 & 1 MG X 42 TBPK Take one 0.5 mg tablet by mouth once daily for 3 days, then increase to one 0.5 mg tablet twice daily for 4 days, then increase to one 1 mg tablet twice daily. Patient not taking: Reported on 10/22/2021 08/20/21   Wendie Agreste, MD   Social History   Socioeconomic History   Marital status: Married    Spouse name: Not on file   Number of children: Not on file   Years of education: Not on file   Highest education level: Not on file  Occupational History   Not on file  Tobacco Use   Smoking status: Every Day    Packs/day: 1.00    Years: 50.00    Total pack years: 50.00    Types: Cigarettes    Start date: 11/26/1967   Smokeless tobacco: Former   Tobacco comments:    smoking x 50 yrs  Vaping Use   Vaping Use: Never used  Substance and Sexual Activity   Alcohol use: Yes    Alcohol/week: 1.0 standard drink of alcohol    Types: 1 Standard drinks or equivalent per week    Comment: rare   Drug use: Yes    Frequency: 1.0 times per week    Types: Marijuana    Comment: as needed   Sexual activity: Yes  Other Topics Concern   Not on file  Social History  Narrative   Lives with wife   Right Handed   Drinks 2-3 cups caffeine daily   Social Determinants of Health   Financial Resource Strain: Low Risk  (01/20/2022)   Overall Financial Resource Strain (CARDIA)    Difficulty of Paying Living Expenses: Not hard at all  Food Insecurity: No Food Insecurity (  01/20/2022)   Hunger Vital Sign    Worried About Running Out of Food in the Last Year: Never true    [place] in the Last Year: Never true  Transportation Needs: No Transportation Needs (01/20/2022)   PRAPARE - Hydrologist (Medical): No    Lack of Transportation (Non-Medical): No  Physical Activity: Inactive (01/20/2022)   Exercise Vital Sign    Days of Exercise per Week: 0 days    Minutes of Exercise per Session: 0 min  Stress: No Stress Concern Present (01/20/2022)   [place]    Feeling of Stress : Not at all  Social Connections: Moderately Integrated (01/20/2022)   Social Connection and Isolation Panel [NHANES]    Frequency of Communication with Friends and Family: More than three times a week    Frequency of Social Gatherings with Friends and Family: More than three times a week    Attends Religious Services: Never    Marine scientist or Organizations: No    Attends Music therapist: More than 4 times per year    Marital Status: Married  Human resources officer Violence: Not At Risk (01/20/2022)   Humiliation, Afraid, Rape, and Kick questionnaire    Fear of Current or Ex-Partner: No    Emotionally Abused: No    Physically Abused: No    Sexually Abused: No    Review of Systems   Objective:   Vitals:   01/28/22 1038  BP: 128/60  Pulse: 61  Temp: 98.3 F (36.8 C)  SpO2: 95%  Weight: 193 lb 3.2 oz (87.6 kg)  Height: '5\' 6"'$  (1.676 m)     Physical Exam Vitals reviewed.  Constitutional:      General: He is not in acute distress.    Appearance: Normal  appearance. He is well-developed.  HENT:     Head: Normocephalic and atraumatic.  Cardiovascular:     Rate and Rhythm: Normal rate.  Pulmonary:     Effort: Pulmonary effort is normal.  Musculoskeletal:     Comments: Lumbar spine, tender paraspinal muscles left lower, no midline bony tenderness.  Negative seated straight leg raise.  Legs, calves with slight discomfort diffusely without appreciable calf swelling.  Trace edema ankles bilaterally.  No discoloration of feet.  Toes warm with cap refill approximately 2 seconds bilaterally.  Unable to palpate DP pulse.  Decreased sensation bilateral feet.  Neurological:     Mental Status: He is alert and oriented to person, place, and time.  Psychiatric:        Mood and Affect: Mood normal.        Assessment & Plan:  Jonathan Gilmore is a 71 y.o. male . Lumbar disc herniation with radiculopathy - Plan: Ambulatory referral to Orthopedic Surgery  Midline low back pain, unspecified chronicity, unspecified whether sciatica present - Plan: Ambulatory referral to Orthopedic Surgery  PAD (peripheral artery disease) ([place]) - Plan: Ambulatory referral to Vascular Surgery  Intermittent claudication ([place]) - Plan: Ambulatory referral to Vascular Surgery  Pain in both lower extremities - Plan: Ambulatory referral to Orthopedic Surgery  Suspect combined reasons for leg pain, including spinal disease, possible nerve impingement with disc herniation reported previously.  Also with claudication from PAD.  Stressed importance of continued attempts at quitting smoking.  Recommended updated imaging as discussed in August, order should still be valid.  Location given for imaging.  Referred back to orthopedist to discuss treatment  options for back, leg pain as well as to vascular given his PAD and worsening claudication.  No sign of critical limb ischemia at this time but ER precautions were discussed.  He did ask about use of an electric scooter, would like to see  recommendations initially with specialist above and keep that option in mind as well.  Recheck 1 month to review other meds and follow-up.  No orders of the defined types were placed in this encounter.  Patient Instructions  Please have xray at [facility] as ordered in August and I will refer you back to Dr. Lorin Mercy. Discuss leg pains, back pain and plans with Dr. Lorin Mercy. Ok to continue gabapentin for now.  I will also refer you back to vascular specialist as circulation may also be part of the leg pain.  If any foot discoloration be seen right away.  Continue to cut back on smoking as that can be one of the main treatments for the circulation. Follow-up in 1 month and we can review your other medications at that time.  Thanks for coming in today and take care.   [place] [facility] in 8:30-4:30 during weekdays, no appointment needed [place].  [place], [place] [address]     Signed,   Merri Ray, MD [place], [facility] 01/28/22 11:10 AM

## 2022-02-01 ENCOUNTER — Encounter (INDEPENDENT_AMBULATORY_CARE_PROVIDER_SITE_OTHER): Payer: Self-pay | Admitting: *Deleted

## 2022-02-03 ENCOUNTER — Ambulatory Visit (INDEPENDENT_AMBULATORY_CARE_PROVIDER_SITE_OTHER): Payer: Medicare Other

## 2022-02-03 ENCOUNTER — Other Ambulatory Visit (HOSPITAL_COMMUNITY): Payer: Self-pay | Admitting: Vascular Surgery

## 2022-02-03 ENCOUNTER — Ambulatory Visit (INDEPENDENT_AMBULATORY_CARE_PROVIDER_SITE_OTHER): Payer: Medicare Other | Admitting: Vascular Surgery

## 2022-02-03 ENCOUNTER — Encounter: Payer: Self-pay | Admitting: Vascular Surgery

## 2022-02-03 VITALS — BP 134/71 | HR 66 | Temp 98.1°F | Ht 66.0 in | Wt 190.2 lb

## 2022-02-03 DIAGNOSIS — I739 Peripheral vascular disease, unspecified: Secondary | ICD-10-CM

## 2022-02-03 NOTE — Progress Notes (Signed)
[facility] [place]  Patient name: Jonathan Gilmore MRN: [id number] DOB: Aug 12, 1950 Sex: male  REASON FOR CONSULT: Evaluation lower extremity arterial insufficiency  HPI: Jonathan Gilmore is a 71 y.o. male, who is here today for discussion of his lower extremity arterial insufficiency.  He continues to have intermittent claudication.  I last saw him in 2019.  At that time he had undergone noninvasive studies suggesting SFA occlusive disease.  He denies any buttock or thigh claudication.  He does have classic calf claudication with cramping and discomfort relieved with rest.  He enjoys going to car shows and reports that this is quite limiting to him due to his walking discomfort.  He does not have any tissue loss.  He has no arterial rest pain.  Unfortunately he continues to smoke cigarettes.  Past Medical History:  Diagnosis Date   Abnormal cardiovascular stress test 03/2013   Anxiety    Arthritis    Back pain    CHF (congestive heart failure) (HCC)    Coronary artery disease    Multivessel with significant LAD involvement by chest CT 2014   Depression    Diastolic dysfunction    Essential hypertension    Lumbar herniated disc    L4-L5   MRSA (methicillin resistant staph aureus) culture positive    Neuropathy    Shortness of breath dyspnea    Sleep apnea    No cpap   Type 2 diabetes mellitus ([place])     Family History  Problem Relation Age of Onset   Lung cancer Mother    Diabetes Father    Hepatitis B Father    Diabetes Sister    Cushing syndrome Sister     SOCIAL HISTORY: Social History   Socioeconomic History   Marital status: Married    Spouse name: Not on file   Number of children: Not on file   Years of education: Not on file   Highest education level: Not on file  Occupational History   Not on file  Tobacco Use   Smoking status: Every Day    Packs/day: 1.00    Years: 50.00    Total pack years: 50.00     Types: Cigarettes    Start date: 11/26/1967   Smokeless tobacco: Former   Tobacco comments:    smoking x 50 yrs  Vaping Use   Vaping Use: Never used  Substance and Sexual Activity   Alcohol use: Yes    Alcohol/week: 1.0 standard drink of alcohol    Types: 1 Standard drinks or equivalent per week    Comment: rare   Drug use: Yes    Frequency: 1.0 times per week    Types: Marijuana    Comment: as needed   Sexual activity: Yes  Other Topics Concern   Not on file  Social History Narrative   Lives with wife   Right Handed   Drinks 2-3 cups caffeine daily   Social Determinants of Health   Financial Resource Strain: Low Risk  (01/20/2022)   Overall Financial Resource Strain (CARDIA)    Difficulty of Paying Living Expenses: Not hard at all  Food Insecurity: No Food Insecurity (01/20/2022)   Hunger Vital Sign    Worried About Running Out of Food in the Last Year: Never true    Ran Out of Food in the Last Year: Never true  Transportation Needs: No Transportation Needs (01/20/2022)   PRAPARE - Hydrologist (Medical):  No    Lack of Transportation (Non-Medical): No  Physical Activity: Inactive (01/20/2022)   Exercise Vital Sign    Days of Exercise per Week: 0 days    Minutes of Exercise per Session: 0 min  Stress: No Stress Concern Present (01/20/2022)   [place]    Feeling of Stress : Not at all  Social Connections: Moderately Integrated (01/20/2022)   Social Connection and Isolation Panel [NHANES]    Frequency of Communication with Friends and Family: More than three times a week    Frequency of Social Gatherings with Friends and Family: More than three times a week    Attends Religious Services: Never    Marine scientist or Organizations: No    Attends Music therapist: More than 4 times per year    Marital Status: Married  Human resources officer Violence: Not At Risk  (01/20/2022)   Humiliation, Afraid, Rape, and Kick questionnaire    Fear of Current or Ex-Partner: No    Emotionally Abused: No    Physically Abused: No    Sexually Abused: No    Allergies  Allergen Reactions   Morphine And Related Nausea And Vomiting    Current Outpatient Medications  Medication Sig Dispense Refill   ACCU-CHEK AVIVA PLUS test strip USE AS INSTRUCTED 100 strip 3   aspirin EC 81 MG tablet Take 81 mg by mouth daily.     atorvastatin (LIPITOR) 80 MG tablet Take 1 tablet (80 mg total) by mouth daily. 90 tablet 1   Calcium-Magnesium-Zinc (CAL-MAG-ZINC PO) Take 1 tablet by mouth daily.     Flaxseed, Linseed, (FLAXSEED OIL) 1000 MG CAPS Take 1,000 mg by mouth daily.     furosemide (LASIX) 40 MG tablet TAKE 1 TABLET BY MOUTH DAILY 90 tablet 3   gabapentin (NEURONTIN) 300 MG capsule Take 1-2 capsules (300-600 mg total) by mouth 2 (two) times daily. 270 capsule 3   lisinopril (ZESTRIL) 20 MG tablet Take 1 tablet (20 mg total) by mouth daily. 90 tablet 3   magnesium oxide (MAG-OX) 400 MG tablet Take 400 mg by mouth at bedtime.     metFORMIN (GLUCOPHAGE) 500 MG tablet TAKE 2 TABLETS BY MOUTH TWICE  DAILY 360 tablet 3   metFORMIN (GLUCOPHAGE) 500 MG tablet Take by mouth.     metoprolol succinate (TOPROL-XL) 25 MG 24 hr tablet TAKE 1 TABLET BY MOUTH DAILY AT  BEDTIME 90 tablet 3   Multiple Vitamin (MULTIVITAMIN WITH MINERALS) TABS Take 1 tablet by mouth daily.     nitroGLYCERIN (NITROSTAT) 0.4 MG SL tablet Place 1 tablet (0.4 mg total) under the tongue every 5 (five) minutes x 3 doses as needed for chest pain (if no relief after 2nd dose, proceed to ED for an evaluation). 25 tablet 3   omeprazole (PRILOSEC) 40 MG capsule Take 1 capsule (40 mg total) by mouth daily. 90 capsule 3   varenicline (CHANTIX) 1 MG tablet TAKE 1 TABLET BY MOUTH TWICE DAILY 60 tablet 2   chlorhexidine (HIBICLENS) 4 % external liquid Apply topically daily as needed. (Patient not taking: Reported on 12/17/2021)  120 mL 0   mupirocin ointment (BACTROBAN) 2 % Apply 1 application. topically 2 (two) times daily. (Patient not taking: Reported on 12/17/2021) 22 g 0   Varenicline Tartrate, Starter, (CHANTIX STARTING MONTH PAK) 0.5 MG X 11 & 1 MG X 42 TBPK Take one 0.5 mg tablet by mouth once daily for 3 days, then increase  to one 0.5 mg tablet twice daily for 4 days, then increase to one 1 mg tablet twice daily. (Patient not taking: Reported on 10/22/2021) 53 each 0   No current facility-administered medications for this visit.    REVIEW OF SYSTEMS:  '[X]'$  denotes positive finding, '[ ]'$  denotes negative finding Cardiac  Comments:  Chest pain or chest pressure:    Shortness of breath upon exertion:    Short of breath when lying flat:    Irregular heart rhythm:        Vascular    Pain in calf, thigh, or hip brought on by ambulation: x   Pain in feet at night that wakes you up from your sleep:     Blood clot in your veins:    Leg swelling:         Pulmonary    Oxygen at home:    Productive cough:     Wheezing:         Neurologic    Sudden weakness in arms or legs:     Sudden numbness in arms or legs:     Sudden onset of difficulty speaking or slurred speech:    Temporary loss of vision in one eye:     Problems with dizziness:         Gastrointestinal    Blood in stool:     Vomited blood:         Genitourinary    Burning when urinating:     Blood in urine:        Psychiatric    Major depression:         Hematologic    Bleeding problems:    Problems with blood clotting too easily:        Skin    Rashes or ulcers:        Constitutional    Fever or chills:      PHYSICAL EXAM: Vitals:   02/03/22 1043  BP: 134/71  Pulse: 66  Temp: 98.1 F (36.7 C)  SpO2: 97%  Weight: 190 lb 3.2 oz (86.3 kg)  Height: '5\' 6"'$  (1.676 m)    GENERAL: The patient is a well-nourished male, in no acute distress. The vital signs are documented above. CARDIOVASCULAR: 2+ radial and 2+ femoral pulses  bilaterally.  Absent popliteal and distal pulses bilaterally PULMONARY: There is good air exchange  MUSCULOSKELETAL: There are no major deformities or cyanosis. NEUROLOGIC: No focal weakness or paresthesias are detected. SKIN: There are no ulcers or rashes noted. PSYCHIATRIC: The patient has a normal affect.  DATA:  Noninvasive studies today reveal ankle arm index of 0.61 on the right and 0.48 on the left  MEDICAL ISSUES: Stable intermittent claudication due to superficial femoral artery occlusive disease.  I again discussed this at length with the patient.  He is uncomfortable with this level of walking ability.  I did explain my recommendation would be to continue with walking program and continued attempts at smoking cessation.  I did discuss the potential for arteriography and possible endovascular treatment but explained the relatively high rate for recurrence with endovascular treatment in the superficial femoral artery.  I also discussed femoral-popliteal bypass but explained that this would require staged bilateral bypasses and would not recommend this magnitude of treatment.  He understands and is comfortable with continued walking program.  He will notify us should he develop any tissue loss, rest pain or intolerable claudication.   Jonathan Posner, MD FACS [facility]  [place] Tel [phone] Pager [phone]  Note: Portions of this report may have been transcribed using voice recognition software.  Every effort has been made to ensure accuracy; however, inadvertent computerized transcription errors may still be present.

## 2022-02-11 ENCOUNTER — Other Ambulatory Visit (INDEPENDENT_AMBULATORY_CARE_PROVIDER_SITE_OTHER): Payer: Self-pay | Admitting: Gastroenterology

## 2022-02-11 DIAGNOSIS — K209 Esophagitis, unspecified without bleeding: Secondary | ICD-10-CM

## 2022-02-16 ENCOUNTER — Ambulatory Visit: Payer: Medicare Other | Admitting: Orthopaedic Surgery

## 2022-03-03 ENCOUNTER — Other Ambulatory Visit: Payer: Medicare Other

## 2022-03-04 LAB — PSA, TOTAL AND FREE
PSA, Free Pct: 16.5 %
PSA, Free: 0.51 ng/mL
Prostate Specific Ag, Serum: 3.1 ng/mL (ref 0.0–4.0)

## 2022-03-16 ENCOUNTER — Telehealth: Payer: Self-pay

## 2022-03-16 NOTE — Telephone Encounter (Signed)
Patient had labs done on 03-03-22. Wants to know when he needs to come back to have a follow up with Dr. Alyson Ingles.  Please advise.  Call back:  [phone] (H)   Thanks, Helene Kelp

## 2022-03-17 NOTE — Telephone Encounter (Signed)
Scheduled appt for Patient to F/U with Dr. Alyson Ingles for PSA results. Patient voiced understanding

## 2022-04-02 ENCOUNTER — Other Ambulatory Visit: Payer: Self-pay

## 2022-04-02 ENCOUNTER — Encounter (HOSPITAL_COMMUNITY): Payer: Self-pay

## 2022-04-02 ENCOUNTER — Inpatient Hospital Stay (HOSPITAL_COMMUNITY)
Admission: EM | Admit: 2022-04-02 | Discharge: 2022-04-05 | DRG: 378 | Disposition: A | Discharge status: Home / Self Care | Payer: Medicare Other | Attending: Internal Medicine | Admitting: Internal Medicine

## 2022-04-02 ENCOUNTER — Emergency Department (HOSPITAL_COMMUNITY): Payer: Medicare Other

## 2022-04-02 DIAGNOSIS — E782 Mixed hyperlipidemia: Secondary | ICD-10-CM | POA: Diagnosis present

## 2022-04-02 DIAGNOSIS — E1151 Type 2 diabetes mellitus with diabetic peripheral angiopathy without gangrene: Secondary | ICD-10-CM | POA: Diagnosis present

## 2022-04-02 DIAGNOSIS — I2510 Atherosclerotic heart disease of native coronary artery without angina pectoris: Secondary | ICD-10-CM | POA: Diagnosis not present

## 2022-04-02 DIAGNOSIS — Q2112 Patent foramen ovale: Secondary | ICD-10-CM

## 2022-04-02 DIAGNOSIS — R710 Precipitous drop in hematocrit: Secondary | ICD-10-CM | POA: Diagnosis not present

## 2022-04-02 DIAGNOSIS — K921 Melena: Secondary | ICD-10-CM | POA: Diagnosis present

## 2022-04-02 DIAGNOSIS — I509 Heart failure, unspecified: Secondary | ICD-10-CM | POA: Diagnosis not present

## 2022-04-02 DIAGNOSIS — I10 Essential (primary) hypertension: Secondary | ICD-10-CM | POA: Diagnosis present

## 2022-04-02 DIAGNOSIS — Z981 Arthrodesis status: Secondary | ICD-10-CM | POA: Diagnosis not present

## 2022-04-02 DIAGNOSIS — E1159 Type 2 diabetes mellitus with other circulatory complications: Secondary | ICD-10-CM | POA: Diagnosis not present

## 2022-04-02 DIAGNOSIS — E1142 Type 2 diabetes mellitus with diabetic polyneuropathy: Secondary | ICD-10-CM | POA: Diagnosis present

## 2022-04-02 DIAGNOSIS — I5032 Chronic diastolic (congestive) heart failure: Secondary | ICD-10-CM | POA: Diagnosis present

## 2022-04-02 DIAGNOSIS — I110 Hypertensive heart disease with heart failure: Secondary | ICD-10-CM | POA: Diagnosis present

## 2022-04-02 DIAGNOSIS — K219 Gastro-esophageal reflux disease without esophagitis: Secondary | ICD-10-CM | POA: Diagnosis present

## 2022-04-02 DIAGNOSIS — I25110 Atherosclerotic heart disease of native coronary artery with unstable angina pectoris: Secondary | ICD-10-CM | POA: Diagnosis present

## 2022-04-02 DIAGNOSIS — R079 Chest pain, unspecified: Secondary | ICD-10-CM | POA: Diagnosis present

## 2022-04-02 DIAGNOSIS — K648 Other hemorrhoids: Secondary | ICD-10-CM | POA: Diagnosis present

## 2022-04-02 DIAGNOSIS — K5730 Diverticulosis of large intestine without perforation or abscess without bleeding: Secondary | ICD-10-CM | POA: Diagnosis present

## 2022-04-02 DIAGNOSIS — Z801 Family history of malignant neoplasm of trachea, bronchus and lung: Secondary | ICD-10-CM

## 2022-04-02 DIAGNOSIS — Z8546 Personal history of malignant neoplasm of prostate: Secondary | ICD-10-CM

## 2022-04-02 DIAGNOSIS — I252 Old myocardial infarction: Secondary | ICD-10-CM

## 2022-04-02 DIAGNOSIS — R195 Other fecal abnormalities: Secondary | ICD-10-CM | POA: Diagnosis not present

## 2022-04-02 DIAGNOSIS — I350 Nonrheumatic aortic (valve) stenosis: Secondary | ICD-10-CM | POA: Diagnosis not present

## 2022-04-02 DIAGNOSIS — I5030 Unspecified diastolic (congestive) heart failure: Secondary | ICD-10-CM | POA: Diagnosis not present

## 2022-04-02 DIAGNOSIS — R0789 Other chest pain: Secondary | ICD-10-CM | POA: Diagnosis not present

## 2022-04-02 DIAGNOSIS — Z885 Allergy status to narcotic agent status: Secondary | ICD-10-CM

## 2022-04-02 DIAGNOSIS — Z7982 Long term (current) use of aspirin: Secondary | ICD-10-CM

## 2022-04-02 DIAGNOSIS — C61 Malignant neoplasm of prostate: Secondary | ICD-10-CM | POA: Diagnosis present

## 2022-04-02 DIAGNOSIS — D62 Acute posthemorrhagic anemia: Secondary | ICD-10-CM | POA: Diagnosis present

## 2022-04-02 DIAGNOSIS — K635 Polyp of colon: Secondary | ICD-10-CM | POA: Diagnosis present

## 2022-04-02 DIAGNOSIS — K922 Gastrointestinal hemorrhage, unspecified: Secondary | ICD-10-CM

## 2022-04-02 DIAGNOSIS — Z8614 Personal history of Methicillin resistant Staphylococcus aureus infection: Secondary | ICD-10-CM

## 2022-04-02 DIAGNOSIS — G4733 Obstructive sleep apnea (adult) (pediatric): Secondary | ICD-10-CM | POA: Diagnosis present

## 2022-04-02 DIAGNOSIS — D649 Anemia, unspecified: Secondary | ICD-10-CM | POA: Diagnosis not present

## 2022-04-02 DIAGNOSIS — F17210 Nicotine dependence, cigarettes, uncomplicated: Secondary | ICD-10-CM | POA: Diagnosis present

## 2022-04-02 DIAGNOSIS — I119 Hypertensive heart disease without heart failure: Secondary | ICD-10-CM | POA: Diagnosis not present

## 2022-04-02 DIAGNOSIS — M1990 Unspecified osteoarthritis, unspecified site: Secondary | ICD-10-CM | POA: Diagnosis present

## 2022-04-02 DIAGNOSIS — E1169 Type 2 diabetes mellitus with other specified complication: Secondary | ICD-10-CM | POA: Diagnosis present

## 2022-04-02 DIAGNOSIS — Z833 Family history of diabetes mellitus: Secondary | ICD-10-CM

## 2022-04-02 DIAGNOSIS — Z91199 Patient's noncompliance with other medical treatment and regimen due to unspecified reason: Secondary | ICD-10-CM

## 2022-04-02 DIAGNOSIS — Z7984 Long term (current) use of oral hypoglycemic drugs: Secondary | ICD-10-CM

## 2022-04-02 DIAGNOSIS — Z79899 Other long term (current) drug therapy: Secondary | ICD-10-CM

## 2022-04-02 DIAGNOSIS — I200 Unstable angina: Secondary | ICD-10-CM

## 2022-04-02 DIAGNOSIS — I080 Rheumatic disorders of both mitral and aortic valves: Secondary | ICD-10-CM | POA: Diagnosis present

## 2022-04-02 DIAGNOSIS — Z1211 Encounter for screening for malignant neoplasm of colon: Secondary | ICD-10-CM | POA: Diagnosis not present

## 2022-04-02 DIAGNOSIS — F418 Other specified anxiety disorders: Secondary | ICD-10-CM | POA: Diagnosis not present

## 2022-04-02 DIAGNOSIS — D509 Iron deficiency anemia, unspecified: Secondary | ICD-10-CM

## 2022-04-02 DIAGNOSIS — K5790 Diverticulosis of intestine, part unspecified, without perforation or abscess without bleeding: Secondary | ICD-10-CM | POA: Diagnosis not present

## 2022-04-02 DIAGNOSIS — E785 Hyperlipidemia, unspecified: Secondary | ICD-10-CM | POA: Diagnosis not present

## 2022-04-02 DIAGNOSIS — Z720 Tobacco use: Secondary | ICD-10-CM | POA: Diagnosis present

## 2022-04-02 LAB — PROTIME-INR
INR: 1.1 (ref 0.8–1.2)
Prothrombin Time: 14 seconds (ref 11.4–15.2)

## 2022-04-02 LAB — CBC
HCT: 26.2 % — ABNORMAL LOW (ref 39.0–52.0)
Hemoglobin: 7.8 g/dL — ABNORMAL LOW (ref 13.0–17.0)
MCH: 23.1 pg — ABNORMAL LOW (ref 26.0–34.0)
MCHC: 29.8 g/dL — ABNORMAL LOW (ref 30.0–36.0)
MCV: 77.5 fL — ABNORMAL LOW (ref 80.0–100.0)
Platelets: 309 10*3/uL (ref 150–400)
RBC: 3.38 MIL/uL — ABNORMAL LOW (ref 4.22–5.81)
RDW: 16.9 % — ABNORMAL HIGH (ref 11.5–15.5)
WBC: 8.5 10*3/uL (ref 4.0–10.5)
nRBC: 0 % (ref 0.0–0.2)

## 2022-04-02 LAB — ABO/RH: ABO/RH(D): A POS

## 2022-04-02 LAB — TROPONIN I (HIGH SENSITIVITY)
Troponin I (High Sensitivity): 11 ng/L (ref <18)
Troponin I (High Sensitivity): 12 ng/L (ref <18)

## 2022-04-02 LAB — POC OCCULT BLOOD, ED: Fecal Occult Blood: POSITIVE

## 2022-04-02 LAB — BASIC METABOLIC PANEL
Anion gap: 9 (ref 5–15)
BUN: 16 mg/dL (ref 8–23)
CO2: 27 mmol/L (ref 22–32)
Calcium: 8.3 mg/dL — ABNORMAL LOW (ref 8.9–10.3)
Chloride: 97 mmol/L — ABNORMAL LOW (ref 98–111)
Creatinine, Ser: 0.92 mg/dL (ref 0.61–1.24)
GFR, Estimated: >60 mL/min (ref >60)
Glucose, Bld: 305 mg/dL — ABNORMAL HIGH (ref 70–99)
Potassium: 3.8 mmol/L (ref 3.5–5.1)
Sodium: 133 mmol/L — ABNORMAL LOW (ref 135–145)

## 2022-04-02 LAB — HEPATIC FUNCTION PANEL
ALT: 16 U/L (ref 0–44)
AST: 22 U/L (ref 15–41)
Albumin: 3.4 g/dL — ABNORMAL LOW (ref 3.5–5.0)
Alkaline Phosphatase: 85 U/L (ref 38–126)
Bilirubin, Direct: <0.1 mg/dL (ref 0.0–0.2)
Total Bilirubin: 0.5 mg/dL (ref 0.3–1.2)
Total Protein: 6.4 g/dL — ABNORMAL LOW (ref 6.5–8.1)

## 2022-04-02 LAB — CBG MONITORING, ED: Glucose-Capillary: 225 mg/dL — ABNORMAL HIGH (ref 70–99)

## 2022-04-02 LAB — PREPARE RBC (CROSSMATCH)

## 2022-04-02 MED ORDER — INSULIN ASPART 100 UNIT/ML IJ SOLN
0.0000 [IU] | Freq: Every day | INTRAMUSCULAR | Status: DC
  Administered 2022-04-02: 2 [IU] via SUBCUTANEOUS
  Filled 2022-04-02: qty 1

## 2022-04-02 MED ORDER — FUROSEMIDE 10 MG/ML IJ SOLN: 20.0000 mg | Freq: Once | INTRAMUSCULAR | Status: DC | PRN

## 2022-04-02 MED ORDER — ATORVASTATIN CALCIUM 80 MG PO TABS
80.0000 mg | ORAL_TABLET | Freq: Every day | ORAL | Status: DC
  Administered 2022-04-02 – 2022-04-05 (×4): 80 mg via ORAL
  Filled 2022-04-02: qty 1
  Filled 2022-04-02: qty 2
  Filled 2022-04-02: qty 1
  Filled 2022-04-02: qty 2

## 2022-04-02 MED ORDER — INSULIN ASPART 100 UNIT/ML IJ SOLN
0.0000 [IU] | Freq: Three times a day (TID) | INTRAMUSCULAR | Status: DC
  Administered 2022-04-04: 1 [IU] via SUBCUTANEOUS

## 2022-04-02 MED ORDER — PANTOPRAZOLE 80MG IVPB - SIMPLE MED
80.0000 mg | Freq: Once | INTRAVENOUS | Status: AC
  Administered 2022-04-02: 80 mg via INTRAVENOUS
  Filled 2022-04-02: qty 100

## 2022-04-02 MED ORDER — ACETAMINOPHEN 650 MG RE SUPP: 650.0000 mg | Freq: Four times a day (QID) | RECTAL | Status: DC | PRN

## 2022-04-02 MED ORDER — NICOTINE 21 MG/24HR TD PT24
21.0000 mg | MEDICATED_PATCH | Freq: Every day | TRANSDERMAL | Status: DC
  Administered 2022-04-02 – 2022-04-04 (×3): 21 mg via TRANSDERMAL
  Filled 2022-04-02 (×4): qty 1

## 2022-04-02 MED ORDER — ALBUTEROL SULFATE (2.5 MG/3ML) 0.083% IN NEBU: 2.5000 mg | INHALATION_SOLUTION | RESPIRATORY_TRACT | Status: DC | PRN

## 2022-04-02 MED ORDER — PANTOPRAZOLE INFUSION (NEW) - SIMPLE MED
8.0000 mg/h | INTRAVENOUS | Status: DC
  Administered 2022-04-02 – 2022-04-04 (×3): 8 mg/h via INTRAVENOUS
  Filled 2022-04-02 (×5): qty 100

## 2022-04-02 MED ORDER — SODIUM CHLORIDE 0.9% IV SOLUTION: Freq: Once | INTRAVENOUS | Status: AC

## 2022-04-02 MED ORDER — GABAPENTIN 300 MG PO CAPS
300.0000 mg | ORAL_CAPSULE | Freq: Two times a day (BID) | ORAL | Status: DC
  Administered 2022-04-02: 300 mg via ORAL
  Administered 2022-04-03 (×2): 600 mg via ORAL
  Administered 2022-04-04: 300 mg via ORAL
  Administered 2022-04-04 – 2022-04-05 (×2): 600 mg via ORAL
  Filled 2022-04-02 (×3): qty 2
  Filled 2022-04-02 (×2): qty 1
  Filled 2022-04-02: qty 2

## 2022-04-02 MED ORDER — ACETAMINOPHEN 325 MG PO TABS: 650.0000 mg | ORAL_TABLET | Freq: Four times a day (QID) | ORAL | Status: DC | PRN

## 2022-04-02 MED ORDER — METOPROLOL SUCCINATE ER 25 MG PO TB24
25.0000 mg | ORAL_TABLET | Freq: Every day | ORAL | Status: DC
  Administered 2022-04-02 – 2022-04-04 (×3): 25 mg via ORAL
  Filled 2022-04-02 (×3): qty 1

## 2022-04-02 NOTE — ED Provider Notes (Signed)
[facility] Provider Note   CSN: [id number] Arrival date & time: 04/02/22  1855     History  Chief Complaint  Patient presents with   Chest Pain    Jonathan Gilmore is a 72 y.o. male smoker with a history of CHF who presents emergency department with complaint of chest pain.  Patient states that over the past 2 weeks he has had progressively worsening severe exertional chest pain and sometimes has chest pain at rest.  Patient states that whenever he gets up and tries to do something he begins to have crushing central chest pain and shortness of breath.  He has been taking a lot of nitroglycerin over the past 2 weeks.  Patient states that yesterday he tried to move out of branch out of his yard and "felt like I was going to die."  He states that he sat in his truck with severe nausea cold sweats severe shortness of breath and severe pain and pressure in his chest.  He took 2 nitroglycerin and it took about 15 minutes for his pain to go away.  Patient states that today he just tried to walk down the hall to the bathroom and was unable to go any further due to the severe pain in his chest which is what brought him here.  It does not appear that he has ever had a cardiac catheterization.  Review of EMR shows the patient had a chemical stress test 1 year ago that showed the appearance of what appears to be previous inferolateral MI.  Patient is diabetic he has a history of hypertension hyperlipidemia.  He has no active pain at this time but states "that is because am not moving around."   Chest Pain      Home Medications Prior to Admission medications   Medication Sig Start Date End Date Taking? Authorizing Provider  aspirin EC 81 MG tablet Take 81 mg by mouth daily.   Yes [provider]  atorvastatin (LIPITOR) 80 MG tablet Take 1 tablet (80 mg total) by mouth daily. 10/22/21  Yes Wendie Agreste, MD  Calcium-Magnesium-Zinc (CAL-MAG-ZINC  PO) Take 1 tablet by mouth daily.   Yes [provider]  Flaxseed, Linseed, (FLAXSEED OIL) 1000 MG CAPS Take 1,000 mg by mouth daily.   Yes [provider]  furosemide (LASIX) 40 MG tablet TAKE 1 TABLET BY MOUTH DAILY Patient taking differently: Take 40 mg by mouth daily as needed for fluid. 01/07/22  Yes BranchAlphonse Guild, MD  gabapentin (NEURONTIN) 300 MG capsule Take 1-2 capsules (300-600 mg total) by mouth 2 (two) times daily. Patient taking differently: Take 900-1,200 mg by mouth at bedtime. 10/22/21  Yes Wendie Agreste, MD  lisinopril (ZESTRIL) 20 MG tablet Take 1 tablet (20 mg total) by mouth daily. 02/23/21 10/16/78 Yes Wendie Agreste, MD  magnesium oxide (MAG-OX) 400 MG tablet Take 400 mg by mouth at bedtime.   Yes [provider]  metFORMIN (GLUCOPHAGE) 500 MG tablet TAKE 2 TABLETS BY MOUTH TWICE  DAILY 10/12/21  Yes Wendie Agreste, MD  metoprolol succinate (TOPROL-XL) 25 MG 24 hr tablet TAKE 1 TABLET BY MOUTH DAILY AT  BEDTIME 01/07/22  Yes Branch, Alphonse Guild, MD  Multiple Vitamin (MULTIVITAMIN WITH MINERALS) TABS Take 1 tablet by mouth daily.   Yes [provider]  nitroGLYCERIN (NITROSTAT) 0.4 MG SL tablet Place 1 tablet (0.4 mg total) under the tongue every 5 (five) minutes x 3 doses  as needed for chest pain (if no relief after 2nd dose, proceed to ED for an evaluation). 10/15/21  Yes BranchAlphonse Guild, MD  omeprazole (PRILOSEC) 40 MG capsule Take 1 capsule (40 mg total) by mouth daily. 03/17/21  Yes Harvel Quale, MD  Potassium 99 MG TABS Take 1 tablet by mouth every evening.   Yes [provider]  ACCU-CHEK AVIVA PLUS test strip USE AS INSTRUCTED 06/13/19   Maryruth Hancock, MD  chlorhexidine (HIBICLENS) 4 % external liquid Apply topically daily as needed. Patient not taking: Reported on 12/17/2021 08/14/21   Volney American, PA-C  mupirocin ointment (BACTROBAN) 2 % Apply 1 application. topically 2 (two) times  daily. Patient not taking: Reported on 12/17/2021 08/14/21   Volney American, PA-C      Allergies    Morphine and related    Review of Systems   Review of Systems  Cardiovascular:  Positive for chest pain.    Physical Exam Updated Vital Signs BP (!) 130/51 (BP Location: Left Arm)   Pulse 72   Temp 97.8 F (36.6 C) (Oral)   Resp 18   Ht '5\' 7"'$  (1.702 m)   Wt 84.4 kg   SpO2 96%   BMI 29.15 kg/m  Physical Exam Vitals and nursing note reviewed.  Constitutional:      General: He is not in acute distress.    Appearance: He is well-developed. He is not diaphoretic.  HENT:     Head: Normocephalic and atraumatic.  Eyes:     General: No scleral icterus.    Conjunctiva/sclera: Conjunctivae normal.  Cardiovascular:     Rate and Rhythm: Normal rate and regular rhythm.     Heart sounds: Normal heart sounds.  Pulmonary:     Effort: Pulmonary effort is normal. No respiratory distress.     Breath sounds: Normal breath sounds.  Abdominal:     Palpations: Abdomen is soft.     Tenderness: There is no abdominal tenderness.  Musculoskeletal:     Cervical back: Normal range of motion and neck supple.  Skin:    General: Skin is warm and dry.  Neurological:     Mental Status: He is alert.  Psychiatric:        Behavior: Behavior normal.     ED Results / Procedures / Treatments   Labs (all labs ordered are listed, but only abnormal results are displayed) Labs Reviewed  BASIC METABOLIC PANEL - Abnormal; Notable for the following components:      Result Value   Sodium 133 (*)    Chloride 97 (*)    Glucose, Bld 305 (*)    Calcium 8.3 (*)    All other components within normal limits  CBC - Abnormal; Notable for the following components:   RBC 3.38 (*)    Hemoglobin 7.8 (*)    HCT 26.2 (*)    MCV 77.5 (*)    MCH 23.1 (*)    MCHC 29.8 (*)    RDW 16.9 (*)    All other components within normal limits  HEPATIC FUNCTION PANEL - Abnormal; Notable for the following components:    Total Protein 6.4 (*)    Albumin 3.4 (*)    All other components within normal limits  BASIC METABOLIC PANEL - Abnormal; Notable for the following components:   Glucose, Bld 114 (*)    Calcium 8.3 (*)    All other components within normal limits  CBC - Abnormal; Notable for the following components:  WBC 11.2 (*)    RBC 3.81 (*)    Hemoglobin 9.1 (*)    HCT 30.3 (*)    MCV 79.5 (*)    MCH 23.9 (*)    RDW 17.1 (*)    All other components within normal limits  LIPID PANEL - Abnormal; Notable for the following components:   HDL 34 (*)    All other components within normal limits  HEMOGLOBIN A1C - Abnormal; Notable for the following components:   Hgb A1c MFr Bld 8.3 (*)    All other components within normal limits  BRAIN NATRIURETIC PEPTIDE - Abnormal; Notable for the following components:   B Natriuretic Peptide 227.0 (*)    All other components within normal limits  CBC WITH DIFFERENTIAL/PLATELET - Abnormal; Notable for the following components:   RBC 3.95 (*)    Hemoglobin 9.3 (*)    HCT 30.7 (*)    MCV 77.7 (*)    MCH 23.5 (*)    RDW 17.2 (*)    All other components within normal limits  GLUCOSE, CAPILLARY - Abnormal; Notable for the following components:   Glucose-Capillary 162 (*)    All other components within normal limits  CBC WITH DIFFERENTIAL/PLATELET - Abnormal; Notable for the following components:   RBC 3.97 (*)    Hemoglobin 9.4 (*)    HCT 30.9 (*)    MCV 77.8 (*)    MCH 23.7 (*)    RDW 17.4 (*)    All other components within normal limits  BASIC METABOLIC PANEL - Abnormal; Notable for the following components:   Glucose, Bld 115 (*)    BUN 7 (*)    All other components within normal limits  BRAIN NATRIURETIC PEPTIDE - Abnormal; Notable for the following components:   B Natriuretic Peptide 612.9 (*)    All other components within normal limits  GLUCOSE, CAPILLARY - Abnormal; Notable for the following components:   Glucose-Capillary 126 (*)    All  other components within normal limits  GLUCOSE, CAPILLARY - Abnormal; Notable for the following components:   Glucose-Capillary 119 (*)    All other components within normal limits  CBG MONITORING, ED - Abnormal; Notable for the following components:   Glucose-Capillary 225 (*)    All other components within normal limits  CBG MONITORING, ED - Abnormal; Notable for the following components:   Glucose-Capillary 135 (*)    All other components within normal limits  CBG MONITORING, ED - Abnormal; Notable for the following components:   Glucose-Capillary 149 (*)    All other components within normal limits  CBG MONITORING, ED - Abnormal; Notable for the following components:   Glucose-Capillary 130 (*)    All other components within normal limits  PROTIME-INR  MAGNESIUM  FERRITIN  IRON AND TIBC  POC OCCULT BLOOD, ED  TYPE AND SCREEN  PREPARE RBC (CROSSMATCH)  ABO/RH  TROPONIN I (HIGH SENSITIVITY)  TROPONIN I (HIGH SENSITIVITY)  TROPONIN I (HIGH SENSITIVITY)    EKG EKG Interpretation  Date/Time:  Friday April 02 2022 19:40:28 EST Ventricular Rate:  84 PR Interval:  191 QRS Duration: 97 QT Interval:  384 QTC Calculation: 454 R Axis:   40 Text Interpretation: Sinus rhythm Repol abnrm suggests ischemia, diffuse leads ST depressions slightly worse than prior 4/21 Confirmed by Aletta Edouard [phone]) on 04/02/2022 7:46:31 PM  Radiology DG Chest Port 1 View  Result Date: 04/04/2022 CLINICAL DATA:  Shortness of breath. EXAM: PORTABLE CHEST 1 VIEW COMPARISON:  04/02/2022 FINDINGS: Lungs are  adequately inflated with interval worsening opacification right base likely small effusion with associated atelectasis although infection is possible. Left lung is clear. Cardiomediastinal silhouette and remainder of the exam is unchanged. IMPRESSION: Interval worsening opacification right base likely small effusion with associated atelectasis, although infection is possible. Electronically Signed    By: Marin Olp M.D.   On: 04/04/2022 09:17   ECHOCARDIOGRAM COMPLETE  Result Date: 04/03/2022    ECHOCARDIOGRAM REPORT   Patient Name:   KAIYDEN SIMKIN Date of Exam: 04/03/2022 Medical Rec #:  [id number]      Height:       67.0 in Accession #:    [id number]     Weight:       185.0 lb Date of Birth:  11-Jun-1950      BSA:          1.956 m Patient Age:    19 years       BP:           128/53 mmHg Patient Gender: M              HR:           73 bpm. Exam Location:  [facility] Procedure: 2D Echo, Cardiac Doppler and Color Doppler Indications:    Chest Pain R07.9  History:        Patient has prior history of Echocardiogram examinations, most                 recent 09/10/2020. CHF, CAD, Aortic Valve Disease,                 Signs/Symptoms:Syncope; Risk Factors:Hypertension, Diabetes,                 Dyslipidemia and Current Smoker.  Sonographer:    Alvino Chapel RCS Referring Phys: [id number] [place] Comments: Patient had a difficult time lying on left side. IMPRESSIONS  1. Left ventricular ejection fraction, by estimation, is 55 to 60%. The left ventricle has normal function. The left ventricle demonstrates regional wall motion abnormalities (see scoring diagram/findings for description). There is moderate left ventricular hypertrophy. Left ventricular diastolic parameters are consistent with Grade I diastolic dysfunction (impaired relaxation).  2. Right ventricular systolic function is normal. The right ventricular size is normal.  3. Left atrial size was severely dilated.  4. Right atrial size was mildly dilated.  5. The mitral valve is normal in structure. Mild mitral valve regurgitation. No evidence of mitral stenosis.  6. Prior AV mean gradient 17 mmHg. The aortic valve is calcified. There is moderate calcification of the aortic valve. There is moderate thickening of the aortic valve. Aortic valve regurgitation is not visualized. Moderate aortic valve stenosis. Aortic  valve mean gradient  measures 23.0 mmHg. Aortic valve Vmax measures 3.08 m/s.  7. The inferior vena cava is dilated in size with <50% respiratory variability, suggesting right atrial pressure of 15 mmHg.  8. Evidence of atrial level shunting detected by color flow Doppler. There is a small patent foramen ovale with predominantly left to right shunting across the atrial septum. Comparison(s): Prior images reviewed side by side. FINDINGS  Left Ventricle: Left ventricular ejection fraction, by estimation, is 55 to 60%. The left ventricle has normal function. The left ventricle demonstrates regional wall motion abnormalities. The left ventricular internal cavity size was normal in size. There is moderate left ventricular hypertrophy. Left ventricular diastolic parameters are consistent with Grade I diastolic dysfunction (impaired relaxation).  LV Wall Scoring:  The basal inferior segment is akinetic. Right Ventricle: The right ventricular size is normal. No increase in right ventricular wall thickness. Right ventricular systolic function is normal. Left Atrium: Left atrial size was severely dilated. Right Atrium: Right atrial size was mildly dilated. Pericardium: There is no evidence of pericardial effusion. Mitral Valve: The mitral valve is normal in structure. Mild mitral valve regurgitation. No evidence of mitral valve stenosis. Tricuspid Valve: The tricuspid valve is normal in structure. Tricuspid valve regurgitation is not demonstrated. No evidence of tricuspid stenosis. Aortic Valve: Prior AV mean gradient 17 mmHg. The aortic valve is calcified. There is moderate calcification of the aortic valve. There is moderate thickening of the aortic valve. Aortic valve regurgitation is not visualized. Moderate aortic stenosis is present. Aortic valve mean gradient measures 23.0 mmHg. Aortic valve peak gradient measures 37.9 mmHg. Aortic valve area, by VTI measures 0.90 cm. Pulmonic Valve: The pulmonic valve was normal in structure. Pulmonic  valve regurgitation is trivial. No evidence of pulmonic stenosis. Aorta: The aortic root is normal in size and structure. Venous: The inferior vena cava is dilated in size with less than 50% respiratory variability, suggesting right atrial pressure of 15 mmHg. IAS/Shunts: Evidence of atrial level shunting detected by color flow Doppler. A small patent foramen ovale is detected with predominantly left to right shunting across the atrial septum.  LEFT VENTRICLE PLAX 2D LVIDd:         4.90 cm   Diastology LVIDs:         3.40 cm   LV e' medial:    7.51 cm/s LV PW:         1.40 cm   LV E/e' medial:  18.5 LV IVS:        1.30 cm   LV e' lateral:   6.53 cm/s LVOT diam:     2.10 cm   LV E/e' lateral: 21.3 LV SV:         73 LV SV Index:   38 LVOT Area:     3.46 cm  RIGHT VENTRICLE RV S prime:     17.50 cm/s TAPSE (M-mode): 2.8 cm LEFT ATRIUM              Index        RIGHT ATRIUM           Index LA diam:        4.40 cm  2.25 cm/m   RA Area:     23.10 cm LA Vol (A2C):   93.9 ml  48.00 ml/m  RA Volume:   72.80 ml  37.21 ml/m LA Vol (A4C):   138.0 ml 70.54 ml/m LA Biplane Vol: 121.0 ml 61.85 ml/m  AORTIC VALVE AV Area (Vmax):    0.95 cm AV Area (Vmean):   0.86 cm AV Area (VTI):     0.90 cm AV Vmax:           308.00 cm/s AV Vmean:          224.000 cm/s AV VTI:            0.814 m AV Peak Grad:      37.9 mmHg AV Mean Grad:      23.0 mmHg LVOT Vmax:         84.20 cm/s LVOT Vmean:        55.600 cm/s LVOT VTI:          0.212 m LVOT/AV VTI ratio: 0.26  AORTA Ao Root diam: 3.20 cm MITRAL VALVE  MV Area (PHT): 3.65 cm     SHUNTS MV Decel Time: 208 msec     Systemic VTI:  0.21 m MV E velocity: 139.00 cm/s  Systemic Diam: 2.10 cm MV A velocity: 58.70 cm/s MV E/A ratio:  2.37 Candee Furbish MD Electronically signed by Candee Furbish MD Signature Date/Time: 04/03/2022/1:42:52 PM    Final    DG Chest 2 View  Result Date: 04/02/2022 CLINICAL DATA:  Chest pain. EXAM: CHEST - 2 VIEW COMPARISON:  12/22/2020. FINDINGS: The heart size and  mediastinal contours are within normal limits. There is atherosclerotic calcification of the aorta. The pulmonary vasculature is distended. No consolidation, effusion, or pneumothorax. Cervical spinal fusion hardware is noted. No acute osseous abnormality. IMPRESSION: Pulmonary vascular congestion. Electronically Signed   By: Brett Fairy M.D.   On: 04/02/2022 20:02    Procedures .Critical Care  Performed by: Margarita Mail, PA-C Authorized by: Margarita Mail, PA-C   Critical care provider statement:    Critical care time (minutes):  30   Critical care time was exclusive of:  Separately billable procedures and treating other patients   Critical care was necessary to treat or prevent imminent or life-threatening deterioration of the following conditions:  Circulatory failure   Critical care was time spent personally by me on the following activities:  Development of treatment plan with patient or surrogate, discussions with consultants, evaluation of patient's response to treatment, examination of patient, ordering and review of laboratory studies, ordering and review of radiographic studies, ordering and performing treatments and interventions, pulse oximetry, re-evaluation of patient's condition, review of old charts, obtaining history from patient or surrogate and interpretation of cardiac output measurements     Medications Ordered in ED Medications  pantoprozole (PROTONIX) 80 mg /NS 100 mL infusion (8 mg/hr Intravenous New Bag/Given 04/04/22 0403)  atorvastatin (LIPITOR) tablet 80 mg (80 mg Oral Given 04/04/22 0910)  metoprolol succinate (TOPROL-XL) 24 hr tablet 25 mg (25 mg Oral Given 04/03/22 2011)  gabapentin (NEURONTIN) capsule 300-600 mg (300 mg Oral Given 04/04/22 0910)  acetaminophen (TYLENOL) tablet 650 mg (has no administration in time range)    Or  acetaminophen (TYLENOL) suppository 650 mg (has no administration in time range)  albuterol (PROVENTIL) (2.5 MG/3ML) 0.083% nebulizer  solution 2.5 mg (has no administration in time range)  nicotine (NICODERM CQ - dosed in mg/24 hours) patch 21 mg (21 mg Transdermal Patch Applied 04/04/22 0911)  insulin aspart (novoLOG) injection 0-9 Units (1 Units Subcutaneous Given 04/04/22 0651)  insulin aspart (novoLOG) injection 0-5 Units ( Subcutaneous Not Given 04/03/22 2022)  furosemide (LASIX) injection 20 mg (has no administration in time range)  furosemide (LASIX) injection 20 mg (has no administration in time range)  nitroGLYCERIN (NITROSTAT) SL tablet 0.4 mg (0.4 mg Sublingual Given 04/03/22 1905)  melatonin tablet 3 mg (3 mg Oral Given 04/03/22 2348)  pantoprazole (PROTONIX) 80 mg /NS 100 mL IVPB (0 mg Intravenous Stopped 04/02/22 2209)  0.9 %  sodium chloride infusion (Manually program via Guardrails IV Fluids) (0 mLs Intravenous Stopped 04/03/22 0610)    ED Course/ Medical Decision Making/ A&P Clinical Course as of 04/04/22 1115  Fri Jan 19, 159  7253 72 year old male with known coronary disease here with escalating chest pain.  Currently pain-free.  Lab work concerning for low hemoglobin.  Initial troponin unremarkable.  Will need to sort out whether he is acutely bleeding.  Will need admission for further cardiac workup and possible GI workup. [MB]  2213 EKG not crossing into epic  normal sinus rhythm ST and T wave changes V4 through 6 and inferiorly.  Worse than prior EKG although this changes have been there before. [MB]    Clinical Course User Index [MB] Hayden Rasmussen, MD                             Medical Decision Making Patient arrives with c/o chest pain and sob. The emergent differential diagnosis of chest pain includes: Acute coronary syndrome, pericarditis, aortic dissection, pulmonary embolism, tension pneumothorax, pneumonia, and esophageal rupture.  Initial  Patient Impression concerning for UA. Risk factors for ACS are concerning and include: daily smoking, HTN, HLD, DM, Age and Sex   Heart score: by my  calculation- 8 which is HIgh RIsk  Review of EMR shows stress test 1 y ago that showed no significant abnormalities.   After review of labs patient does not have an elevated troponin level.  CBC shows white oncocytic anemia hemoglobin of 7.8, it was 12.7  5 months ago.  Patient has not noted any black or tarry stools however does state that he has a known gastric ulcer.  Patient's glucose is 3.5 consistent with history of diabetes..  Obtained a fecal occult stool test which was positive.   I discussed all findings with cardiac fellow on-call at [facility]. Although patient does appear to have a GI bleed he is also experiencing significant symptoms of ACS including diaphoresis, exertional severe chest pain along with shortness of breath and nausea.  I would not expect this in GI bleed alone and the symptoms are concerning for ACS.  Cardiology is in agreement however states that he cannot undergo a cardiac catheterization and feels that he does not have unstable angina as his troponins are normal and his EKG which does show some ST segment depression in the inferolateral leads has not changed significantly from previous EKGs.  He recommends admitting the patient for his GI bleed, finding the source and stabilizing bleeding and then he may need a cardiac catheterization early this coming week.   I have discussed all findings of the case with Dr. Waldron [facility] who will admit the patient to [facility]. GI (Dr. Carlean Purl) informed and will place the patient on list for scope tomorrow.    Patient has no active chest pain or abdominal pain on my examination.  I visualized and interpreted a two-view chest x-ray which shows some pulmonary vascular congestion without other abnormality.   Amount and/or Complexity of Data Reviewed Labs: ordered. Radiology: ordered.  Risk Decision regarding hospitalization.           Final Clinical Impression(s) / ED Diagnoses Final diagnoses:  Symptomatic anemia   Gastrointestinal hemorrhage, unspecified gastrointestinal hemorrhage type  Unstable angina [facility])    Rx / DC Orders ED Discharge Orders     None         Margarita Mail, PA-C 04/04/22 1115    Hayden Rasmussen, MD 04/04/22 1334

## 2022-04-02 NOTE — ED Notes (Signed)
[id number] unit of blood increased to 200 ml/hr after first 15 mins of transfusion, no s/s of transfusion reaction at this time, pt A&Ox4, NAD noted. Denies any pain at current.

## 2022-04-02 NOTE — ED Notes (Signed)
1st unit blood stated at this time [id number], rate 120 ml/hr

## 2022-04-02 NOTE — H&P (Signed)
TRH H&P   Patient Demographics:    Jonathan Gilmore, is a 72 y.o. male  MRN: [id number]   DOB - 1950-10-13  Admit Date - 04/02/2022  Outpatient Primary MD for the patient is Wendie Agreste, MD  Referring MD/NP/PA: PD Olancha  Outpatient Specialists: cardiologyy Dr. Harl Bowie in [place], GI Dr. Jenetta Downer in [place]  Patient coming from: Home  Chief Complaint  Patient presents with   Chest Pain      HPI:    Jonathan Gilmore  is a 72 y.o. male, with past medical history of chronic diastolic CHF, CAD on aspirin, OSA, hypertension, hyperlipidemia, aortic stenosis, diabetes mellitus, diabetic neuropathy, prostate cancer under observation by Dr. Alyson Ingles from urology. -Patient presents to ED secondary to complaints of chest pain, presents with exertion, resolved while at rest, with sublingual nitro, reports some accompanying dyspnea, reports he still smoking, is noted to have Hemoccult positive stool in ED, most recent endoscopy 2021 significant for esophagitis and nonbleeding duodenal ulcer, most recent colonoscopy in 2022 significant for polyps x 7 s/p resection, patient currently chest pain-free, resolved without intervention, he denies any NSAIDs use, only takes aspirin, he is compliant with other meds, denies any alcohol use. -In ED hemoglobin was noted to be low at 7.8 , was 12.19 October 2021, he was noted to have Hemoccult positive stool, dark color, EKG with some ST depression, his troponins is negative, he is currently chest pain-free, physician discussed with cardiology at [facility],  patient  will need further cardiac workup after his  GI bleed has been controlled, [facility] hospitalist consulted to admit    Review of systems:      A full 10 point Review of Systems was done, except as stated above, all other Review of Systems were negative.   With Past History of the following :     Past Medical History:  Diagnosis Date   Abnormal cardiovascular stress test 03/2013   Anxiety    Arthritis    Back pain    CHF (congestive heart failure) (HCC)    Coronary artery disease    Multivessel with significant LAD involvement by chest CT 2014   Depression    Diastolic dysfunction    Essential hypertension    Lumbar herniated disc    L4-L5   MRSA (methicillin resistant staph aureus) culture positive    Neuropathy    Shortness of breath dyspnea    Sleep apnea    No cpap   Type 2 diabetes mellitus ([place])       Past Surgical History:  Procedure Laterality Date   ANTERIOR CERVICAL DECOMP/DISCECTOMY FUSION  01/18/2011   Procedure: ANTERIOR CERVICAL DECOMPRESSION/DISCECTOMY FUSION 2 LEVELS;  Surgeon: Cooper Render Pool;  Location: [place] [facility];  Service: Neurosurgery;  Laterality: N/A;  anterior cervical discectomy and fusion with allograft and plating, cervical five-six, cervical six-seven   BIOPSY N/A 04/04/2015   Procedure:  BIOPSY;  Surgeon: Rogene Houston, MD;  [facility];  Service: Endoscopy;  Laterality: N/A;   BIOPSY  11/09/2019   Procedure: BIOPSY;  Surgeon: Harvel Quale, MD;  [facility];  Service: Gastroenterology;;   Bone spur removed  1997   Left shoulder   CARDIAC CATHETERIZATION N/A 08/08/2015   Procedure: Right/Left Heart Cath and Coronary Angiography;  Surgeon: Sherren Mocha, MD;  Location: [place] [facility];  Service: Cardiovascular;  Laterality: N/A;   CARPAL TUNNEL RELEASE Right    COLONOSCOPY WITH PROPOFOL N/A 11/09/2019   Procedure: COLONOSCOPY WITH PROPOFOL;  Surgeon: Harvel Quale, MD;  [facility];  Service: Gastroenterology;  Laterality: N/A;  900   COLONOSCOPY WITH PROPOFOL N/A 02/27/2021   Procedure: COLONOSCOPY WITH PROPOFOL;  Surgeon: Harvel Quale, MD;  [facility];  Service: Gastroenterology;  Laterality: N/A;  7:30   ESOPHAGEAL DILATION N/A 04/04/2015    Procedure: ESOPHAGEAL DILATION;  Surgeon: Rogene Houston, MD;  [facility];  Service: Endoscopy;  Laterality: N/A;   ESOPHAGEAL DILATION N/A 11/09/2019   Procedure: ESOPHAGEAL DILATION;  Surgeon: Harvel Quale, MD;  [facility];  Service: Gastroenterology;  Laterality: N/A;   ESOPHAGOGASTRODUODENOSCOPY N/A 04/04/2015   Procedure: ESOPHAGOGASTRODUODENOSCOPY (EGD);  Surgeon: Rogene Houston, MD;  [facility];  Service: Endoscopy;  Laterality: N/A;  1240   ESOPHAGOGASTRODUODENOSCOPY (EGD) WITH PROPOFOL N/A 11/09/2019   Procedure: ESOPHAGOGASTRODUODENOSCOPY (EGD) WITH PROPOFOL;  Surgeon: Harvel Quale, MD;  [facility];  Service: Gastroenterology;  Laterality: N/A;   HEMORRHOID SURGERY     POLYPECTOMY  11/09/2019   Procedure: POLYPECTOMY;  Surgeon: Harvel Quale, MD;  [facility];  Service: Gastroenterology;;   POLYPECTOMY  02/27/2021   Procedure: POLYPECTOMY;  Surgeon: Montez Morita, Quillian Quince, MD;  [facility];  Service: Gastroenterology;;   TONSILLECTOMY        Social History:     Social History   Tobacco Use   Smoking status: Every Day    Packs/day: 1.00    Years: 50.00    Total pack years: 50.00    Types: Cigarettes    Start date: 11/26/1967   Smokeless tobacco: Former   Tobacco comments:    smoking x 50 yrs  Substance Use Topics   Alcohol use: Yes    Alcohol/week: 1.0 standard drink of alcohol    Types: 1 Standard drinks or equivalent per week    Comment: rare       Family History :     Family History  Problem Relation Age of Onset   Lung cancer Mother    Diabetes Father    Hepatitis B Father    Diabetes Sister    Cushing syndrome Sister      Home Medications:   Prior to Admission medications   Medication Sig Start Date End Date Taking? Authorizing Provider  aspirin EC 81 MG tablet Take 81 mg by mouth daily.   Yes [provider]  atorvastatin  (LIPITOR) 80 MG tablet Take 1 tablet (80 mg total) by mouth daily. 10/22/21  Yes Wendie Agreste, MD  Calcium-Magnesium-Zinc (CAL-MAG-ZINC PO) Take 1 tablet by mouth daily.   Yes [provider]  Flaxseed, Linseed, (FLAXSEED OIL) 1000 MG CAPS Take 1,000 mg by mouth daily.   Yes [provider]  furosemide (LASIX) 40 MG tablet TAKE 1 TABLET BY MOUTH DAILY Patient taking differently: Take 40 mg by mouth  daily as needed for fluid. 01/07/22  Yes BranchAlphonse Guild, MD  gabapentin (NEURONTIN) 300 MG capsule Take 1-2 capsules (300-600 mg total) by mouth 2 (two) times daily. Patient taking differently: Take 900-1,200 mg by mouth at bedtime. 10/22/21  Yes Wendie Agreste, MD  lisinopril (ZESTRIL) 20 MG tablet Take 1 tablet (20 mg total) by mouth daily. 02/23/21 10/16/78 Yes Wendie Agreste, MD  magnesium oxide (MAG-OX) 400 MG tablet Take 400 mg by mouth at bedtime.   Yes [provider]  metFORMIN (GLUCOPHAGE) 500 MG tablet TAKE 2 TABLETS BY MOUTH TWICE  DAILY 10/12/21  Yes Wendie Agreste, MD  metoprolol succinate (TOPROL-XL) 25 MG 24 hr tablet TAKE 1 TABLET BY MOUTH DAILY AT  BEDTIME 01/07/22  Yes Branch, Alphonse Guild, MD  Multiple Vitamin (MULTIVITAMIN WITH MINERALS) TABS Take 1 tablet by mouth daily.   Yes [provider]  nitroGLYCERIN (NITROSTAT) 0.4 MG SL tablet Place 1 tablet (0.4 mg total) under the tongue every 5 (five) minutes x 3 doses as needed for chest pain (if no relief after 2nd dose, proceed to ED for an evaluation). 10/15/21  Yes BranchAlphonse Guild, MD  omeprazole (PRILOSEC) 40 MG capsule Take 1 capsule (40 mg total) by mouth daily. 03/17/21  Yes Harvel Quale, MD  Potassium 99 MG TABS Take 1 tablet by mouth every evening.   Yes [provider]  ACCU-CHEK AVIVA PLUS test strip USE AS INSTRUCTED 06/13/19   Maryruth Hancock, MD  chlorhexidine (HIBICLENS) 4 % external liquid Apply topically daily as needed. Patient not taking: Reported on  12/17/2021 08/14/21   Volney American, PA-C  mupirocin ointment (BACTROBAN) 2 % Apply 1 application. topically 2 (two) times daily. Patient not taking: Reported on 12/17/2021 08/14/21   Volney American, PA-C     Allergies:     Allergies  Allergen Reactions   Morphine And Related Nausea And Vomiting     Physical Exam:   Vitals  Blood pressure (!) 130/52, pulse 76, temperature 98.5 F (36.9 C), temperature source Oral, resp. rate 13, height '5\' 7"'$  (1.702 m), weight 83.9 kg, SpO2 97 %.   1. General well developed lying in bed in NAD,    2. Normal affect and insight, Not Suicidal or Homicidal, Awake Alert, Oriented X 3.  3. No F.N deficits, ALL C.Nerves Intact, Strength 5/5 all 4 extremities, Sensation intact all 4 extremities, Plantars down going.  4. Ears and Eyes appear Normal, Conjunctivae clear, PERRLA. Moist Oral Mucosa.  5. Supple Neck, No JVD, No cervical lymphadenopathy appriciated, No Carotid Bruits.  6. Symmetrical Chest wall movement, Good air movement bilaterally, CTAB.  7. RRR, No Gallops, Rubs or Murmurs, No Parasternal Heave.trace edema  8. Positive Bowel Sounds, Abdomen Soft, No tenderness, No organomegaly appriciated,No rebound -guarding or rigidity.  9.  No Cyanosis, Normal Skin Turgor, No Skin Rash or Bruise.  10. Good muscle tone,  joints appear normal , no effusions, Normal ROM.    Data Review:    CBC Recent Labs  Lab 04/02/22 2001  WBC 8.5  HGB 7.8*  HCT 26.2*  PLT 309  MCV 77.5*  MCH 23.1*  MCHC 29.8*  RDW 16.9*   ------------------------------------------------------------------------------------------------------------------  Chemistries  Recent Labs  Lab 04/02/22 2001  NA 133*  K 3.8  CL 97*  CO2 27  GLUCOSE 305*  BUN 16  CREATININE 0.92  CALCIUM 8.3*   ------------------------------------------------------------------------------------------------------------------ estimated creatinine clearance is 76.3 mL/min (by  C-G formula based on  SCr of 0.92 mg/dL). ------------------------------------------------------------------------------------------------------------------ No results for input(s): "TSH", "T4TOTAL", "T3FREE", "THYROIDAB" in the last 72 hours.  Invalid input(s): "FREET3"  Coagulation profile No results for input(s): "INR", "PROTIME" in the last 168 hours. ------------------------------------------------------------------------------------------------------------------- No results for input(s): "DDIMER" in the last 72 hours. -------------------------------------------------------------------------------------------------------------------  Cardiac Enzymes No results for input(s): "CKMB", "TROPONINI", "MYOGLOBIN" in the last 168 hours.  Invalid input(s): "CK" ------------------------------------------------------------------------------------------------------------------    Component Value Date/Time   BNP 196.0 (H) 08/07/2015 0509     ---------------------------------------------------------------------------------------------------------------  Urinalysis    Component Value Date/Time   COLORURINE STRAW (A) 01/16/2018 0938   APPEARANCEUR Clear 09/07/2021 1141   LABSPEC 1.008 01/16/2018 0938   PHURINE 7.0 01/16/2018 0938   GLUCOSEU Negative 09/07/2021 1141   HGBUR NEGATIVE 01/16/2018 0938   BILIRUBINUR Negative 09/07/2021 1141   KETONESUR NEGATIVE 01/16/2018 0938   PROTEINUR Negative 09/07/2021 1141   PROTEINUR NEGATIVE 01/16/2018 0938   UROBILINOGEN 0.2 08/23/2011 0807   NITRITE Negative 09/07/2021 1141   NITRITE NEGATIVE 01/16/2018 0938   LEUKOCYTESUR Negative 09/07/2021 1141    ----------------------------------------------------------------------------------------------------------------   Imaging Results:    DG Chest 2 View  Result Date: 04/02/2022 CLINICAL DATA:  Chest pain. EXAM: CHEST - 2 VIEW COMPARISON:  12/22/2020. FINDINGS: The heart size and mediastinal  contours are within normal limits. There is atherosclerotic calcification of the aorta. The pulmonary vasculature is distended. No consolidation, effusion, or pneumothorax. Cervical spinal fusion hardware is noted. No acute osseous abnormality. IMPRESSION: Pulmonary vascular congestion. Electronically Signed   By: Brett Fairy M.D.   On: 04/02/2022 20:02     EKG: Rhythm NSR, PR interval 191 ms QRS duration 97 ms QT/QTcB 384/454 ms P-R-T axes 63 40 -31 Sinus rhythm Repol abnrm suggests ischemia, diffuse leads ST depressions slightly worse than prior 4/21   Assessment & Plan:    Principal Problem:   Acute blood loss anemia Active Problems:   Chest pain   DM type 2 causing vascular disease (HCC)   Mixed hyperlipidemia   Diastolic CHF ([place])   Tobacco use   Essential hypertension, benign   Prostate cancer ([place])   Acute blood loss anemia GI bleed -Presents with significant drop in hemoglobin by 5 g over the last few month 12.7 last August, currently 7.8, Hemoccult positive stool with mild melena. -Will hold aspirin. -Denies any history of NSAIDs use, history significant for esophagitis and nonbleeding duodenal ulcer in 2021, history of polyps and colonoscopy in 2022 -Clear liquid diet, n.p.o. after midnight. -Upon IV fluids -Keep on Protonix drip. -Will transfuse 2 units PRBC in the setting of known CAD, where his presents with chest pain  Chest pain/stable angina CAD -Appears to be stable angina, this is very likely provoked by his anemia -  echo 07/2015 LVEF 45-50%, restrictive diastolic dysfunction, 08/2701 echo LVEF 60-65% -cath 07/2015 with 50% LAD disease, overall patent coronaries  - -Will transfuse 2 units PRBC in the setting of known CAD, where his presents with chest pain - [facility] team to inform  [facility] cardiology when patient gets [facility] -He is currently chest pain-free - will repeat echo -Continue with statin, metoprolol, will have to hold aspirin in the setting of GI  bleed  OSA -Not using CPAP due to discomfort  Hyperlipidemia - Continue with statin  Aortic stenosis - 08/2020 echo LVEF 60-65%, no WMAs, grade I dd, mild MR, mild AI, mild to mod AS mean grade 17, AVA VTI 1.12 DI 0.29 - no symptoms.  Hypertension -Blood pressure is borderline to  soft, will hold lisinopril, but will continue with metoprolol due to chest pain  Chronic diastolic CHF -Euvolemic, give Lasix after each unit transfusion  Diabetes mellitus type 2 -Will hold metformin and keep an insulin sliding scale  PAD -Followed by vascular surgery\  Neuropathy - Continue with gabapentin  Prostate cancer -followed by urology Dr. Alyson Ingles in [place]  Tobacco abuse -He was counseled, will start on nicotine patch    DVT Prophylaxis - SCDs   AM Labs Ordered, also please review Full Orders  Family Communication: Admission, patients condition and plan of care including tests being ordered have been discussed with the patient and wife who indicate understanding and agree with the plan and Code Status.  Code Status Full  Likely DC to  home  Condition GUARDED    Consults called: ED D/W with card fellow Dr Humphrey Rolls [facility] cardiology will still need to be notified when patient gets to [facility]), GI Dr. Carlean Purl Was notified, and he will see patient when he gets to [facility].  Admission status: inpatient    Time spent in minutes : 70 minutes   Phillips Climes M.D on 04/02/2022 at 9:56 PM   [facility] - Office  [phone]

## 2022-04-02 NOTE — ED Triage Notes (Signed)
Pt reports intermittent chest pain x 1 week.  Pt reports chest pain is worse with activity and resolves with sitting down.

## 2022-04-03 ENCOUNTER — Other Ambulatory Visit (HOSPITAL_COMMUNITY): Payer: Self-pay | Admitting: *Deleted

## 2022-04-03 ENCOUNTER — Inpatient Hospital Stay (HOSPITAL_COMMUNITY): Payer: Medicare Other

## 2022-04-03 ENCOUNTER — Other Ambulatory Visit: Payer: Self-pay

## 2022-04-03 DIAGNOSIS — D62 Acute posthemorrhagic anemia: Secondary | ICD-10-CM | POA: Diagnosis not present

## 2022-04-03 DIAGNOSIS — R079 Chest pain, unspecified: Secondary | ICD-10-CM | POA: Diagnosis not present

## 2022-04-03 LAB — ECHOCARDIOGRAM COMPLETE
AR max vel: 0.95 cm2
AV Area VTI: 0.9 cm2
AV Area mean vel: 0.86 cm2
AV Mean grad: 23 mmHg
AV Peak grad: 37.9 mmHg
Ao pk vel: 3.08 m/s
Area-P 1/2: 3.65 cm2
Height: 67 in
S' Lateral: 3.4 cm
Weight: 2960 oz

## 2022-04-03 LAB — HEMOGLOBIN A1C
Hgb A1c MFr Bld: 8.3 % — ABNORMAL HIGH (ref 4.8–5.6)
Mean Plasma Glucose: 191.51 mg/dL

## 2022-04-03 LAB — CBC WITH DIFFERENTIAL/PLATELET
Abs Immature Granulocytes: 0.04 10*3/uL (ref 0.00–0.07)
Basophils Absolute: 0.1 10*3/uL (ref 0.0–0.1)
Basophils Relative: 1 %
Eosinophils Absolute: 0.3 10*3/uL (ref 0.0–0.5)
Eosinophils Relative: 4 %
HCT: 30.7 % — ABNORMAL LOW (ref 39.0–52.0)
Hemoglobin: 9.3 g/dL — ABNORMAL LOW (ref 13.0–17.0)
Immature Granulocytes: 0 %
Lymphocytes Relative: 23 %
Lymphs Abs: 2.2 10*3/uL (ref 0.7–4.0)
MCH: 23.5 pg — ABNORMAL LOW (ref 26.0–34.0)
MCHC: 30.3 g/dL (ref 30.0–36.0)
MCV: 77.7 fL — ABNORMAL LOW (ref 80.0–100.0)
Monocytes Absolute: 0.8 10*3/uL (ref 0.1–1.0)
Monocytes Relative: 8 %
Neutro Abs: 6 10*3/uL (ref 1.7–7.7)
Neutrophils Relative %: 64 %
Platelets: 305 10*3/uL (ref 150–400)
RBC: 3.95 MIL/uL — ABNORMAL LOW (ref 4.22–5.81)
RDW: 17.2 % — ABNORMAL HIGH (ref 11.5–15.5)
WBC: 9.4 10*3/uL (ref 4.0–10.5)
nRBC: 0 % (ref 0.0–0.2)

## 2022-04-03 LAB — CBC
HCT: 30.3 % — ABNORMAL LOW (ref 39.0–52.0)
Hemoglobin: 9.1 g/dL — ABNORMAL LOW (ref 13.0–17.0)
MCH: 23.9 pg — ABNORMAL LOW (ref 26.0–34.0)
MCHC: 30 g/dL (ref 30.0–36.0)
MCV: 79.5 fL — ABNORMAL LOW (ref 80.0–100.0)
Platelets: 263 10*3/uL (ref 150–400)
RBC: 3.81 MIL/uL — ABNORMAL LOW (ref 4.22–5.81)
RDW: 17.1 % — ABNORMAL HIGH (ref 11.5–15.5)
WBC: 11.2 10*3/uL — ABNORMAL HIGH (ref 4.0–10.5)
nRBC: 0 % (ref 0.0–0.2)

## 2022-04-03 LAB — GLUCOSE, CAPILLARY: Glucose-Capillary: 162 mg/dL — ABNORMAL HIGH (ref 70–99)

## 2022-04-03 LAB — LIPID PANEL
Cholesterol: 98 mg/dL (ref 0–200)
HDL: 34 mg/dL — ABNORMAL LOW (ref >40)
LDL Cholesterol: 54 mg/dL (ref 0–99)
Total CHOL/HDL Ratio: 2.9 RATIO
Triglycerides: 52 mg/dL (ref <150)
VLDL: 10 mg/dL (ref 0–40)

## 2022-04-03 LAB — BASIC METABOLIC PANEL
Anion gap: 11 (ref 5–15)
BUN: 14 mg/dL (ref 8–23)
CO2: 25 mmol/L (ref 22–32)
Calcium: 8.3 mg/dL — ABNORMAL LOW (ref 8.9–10.3)
Chloride: 102 mmol/L (ref 98–111)
Creatinine, Ser: 0.7 mg/dL (ref 0.61–1.24)
GFR, Estimated: >60 mL/min (ref >60)
Glucose, Bld: 114 mg/dL — ABNORMAL HIGH (ref 70–99)
Potassium: 3.7 mmol/L (ref 3.5–5.1)
Sodium: 138 mmol/L (ref 135–145)

## 2022-04-03 LAB — CBG MONITORING, ED
Glucose-Capillary: 135 mg/dL — ABNORMAL HIGH (ref 70–99)
Glucose-Capillary: 149 mg/dL — ABNORMAL HIGH (ref 70–99)
Glucose-Capillary: 130 mg/dL — ABNORMAL HIGH (ref 70–99)

## 2022-04-03 LAB — BRAIN NATRIURETIC PEPTIDE: B Natriuretic Peptide: 227 pg/mL — ABNORMAL HIGH (ref 0.0–100.0)

## 2022-04-03 MED ORDER — MELATONIN 3 MG PO TABS
3.0000 mg | ORAL_TABLET | Freq: Every evening | ORAL | Status: DC | PRN
  Administered 2022-04-03 – 2022-04-04 (×2): 3 mg via ORAL
  Filled 2022-04-03 (×2): qty 1

## 2022-04-03 MED ORDER — NITROGLYCERIN 0.4 MG SL SUBL
0.4000 mg | SUBLINGUAL_TABLET | SUBLINGUAL | Status: DC | PRN
  Administered 2022-04-03 (×2): 0.4 mg via SUBLINGUAL

## 2022-04-03 NOTE — Plan of Care (Signed)
  Problem: Clinical Measurements: Goal: Respiratory complications will improve Outcome: Completed/Met   Problem: Elimination: Goal: Will not experience complications related to urinary retention Outcome: Completed/Met   Problem: Pain Managment: Goal: General experience of comfort will improve Outcome: Completed/Met

## 2022-04-03 NOTE — Progress Notes (Signed)
*  PRELIMINARY RESULTS* Echocardiogram 2D Echocardiogram has been performed.  Jonathan Gilmore 04/03/2022, 10:03 AM

## 2022-04-03 NOTE — TOC Progression Note (Signed)
  Transition of Care [facility]) Screening Note   Patient Details  Name: Jonathan Gilmore Date of Birth: 02/28/1951   Transition of Care [facility]) CM/SW Contact:    Boneta Lucks, RN Phone Number: 04/03/2022, 2:12 PM  Still in ED getting workup.  Transition of Care Department [facility]) has reviewed patient and no TOC needs have been identified at this time. We will continue to monitor patient advancement through interdisciplinary progression rounds. If new patient transition needs arise, please place a TOC consult.       Barriers to Discharge: Continued Medical Work up  Expected Discharge Plan and Services       Living arrangements for the past 2 months: [place]

## 2022-04-03 NOTE — Progress Notes (Signed)
Pt arrived from [place] ED with EMS w/ 7/10 CP- MD notfied, CP protocol initiated, 2 rounds of SL nitro given per orders with good effect to 0/10 CP. EKG taken, told to notify night covering MD. Clear liquids started, NPO at midnight. Protonix gtt running per orders.

## 2022-04-03 NOTE — ED Notes (Signed)
[id number] unit of blood increased to 300 ml/hr no s/s of transfusion reaction at this time, pt A&Ox4, NAD noted. Denies any pain at current.

## 2022-04-03 NOTE — Progress Notes (Signed)
PROGRESS NOTE    Jonathan Gilmore  ZOX:[id number] DOB: 19-Aug-1950 DOA: 04/02/2022 PCP: Wendie Agreste, MD   Brief Narrative:   Jonathan Gilmore  is a 72 y.o. male, with past medical history of chronic diastolic CHF, CAD on aspirin, OSA, hypertension, hyperlipidemia, aortic stenosis, diabetes mellitus, diabetic neuropathy, prostate cancer under observation by Dr. Alyson Ingles from urology. -Patient presents to ED secondary to complaints of chest pain, presents with exertion, resolved while at rest, with sublingual nitro, reports some accompanying dyspnea, reports he still smoking, is noted to have Hemoccult positive stool in ED, most recent endoscopy 2021 significant for esophagitis and nonbleeding duodenal ulcer, most recent colonoscopy in 2022 significant for polyps x 7 s/p resection, patient currently chest pain-free, resolved without intervention, he denies any NSAIDs use, only takes aspirin, he is compliant with other meds, denies any alcohol use. -In ED hemoglobin was noted to be low at 7.8 , was 12.19 October 2021, he was noted to have Hemoccult positive stool, dark color, EKG with some ST depression, his troponins is negative, he is currently chest pain-free, physician discussed with cardiology at [facility],  patient  will need further cardiac workup after his  GI bleed has been controlled, [facility] hospitalist consulted to admit  Assessment & Plan:   Principal Problem:   Acute blood loss anemia Active Problems:   Chest pain   DM type 2 causing vascular disease ([place])   Mixed hyperlipidemia   Diastolic CHF ([place])   Tobacco use   Essential hypertension, benign   Prostate cancer ([place])  Acute blood loss anemia GI bleed -Presents with significant drop in hemoglobin by 5 g over the last few month 12.7 last August, currently 7.8, Hemoccult positive stool with mild melena. history significant for esophagitis and nonbleeding duodenal ulcer in 2021, history of polyps and colonoscopy in 2022.  Patient  has been n.p.o. from midnight, GI at [facility] was contacted however patient is a still at [facility] so I doubt that there are any plans for scopes and thus I will start him on clear liquid diet again and we will keep him n.p.o. from midnight.  Patient received 2 units of PRBC transfusion, hemoglobin improved to 9.  Continue Protonix drip.  Chest pain/stable angina CAD -Appears to be stable angina, this is very likely provoked by his anemia -  echo 07/2015 LVEF 45-50%, restrictive diastolic dysfunction, 10/1189 echo LVEF 60-65% -cath 07/2015 with 50% LAD disease, overall patent coronaries  - [facility] team to inform  [facility] cardiology when patient gets to [facility] -He is currently chest pain-free.  Echo repeated, results pending. -Continue with statin, metoprolol, will have to hold aspirin in the setting of GI bleed   OSA -Not using CPAP due to discomfort   Hyperlipidemia - Continue with statin   Aortic stenosis - 08/2020 echo LVEF 60-65%, no WMAs, grade I dd, mild MR, mild AI, mild to mod AS mean grade 17, AVA VTI 1.12 DI 0.29 - no symptoms.   Hypertension -Blood pressure is borderline to soft, will hold lisinopril, but will continue with metoprolol due to chest pain   Chronic diastolic CHF -Euvolemic, give Lasix after each unit transfusion   Diabetes mellitus type 2 -Hemoglobin A1c pending.  Will hold metformin and keep an insulin sliding scale   PAD -Followed by vascular surgery.  Neuropathy - Continue with gabapentin   Prostate cancer -followed by urology Dr. Alyson Ingles in [place]   Tobacco abuse -He was counseled, continue nicotine patch  DVT prophylaxis: SCDs  Start: 04/02/22 2154   Code Status: Full Code  Family Communication:  None present at bedside.  Plan of care discussed with patient in length and he/she verbalized understanding and agreed with it.  Status is: Inpatient Remains inpatient appropriate because: Pending transfer to [facility] for possible EGD by  GI.   Estimated body mass index is 28.98 kg/m as calculated from the following:   Height as of this encounter: '5\' 7"'$  (1.702 m).   Weight as of this encounter: 83.9 kg.    Nutritional Assessment: Body mass index is 28.98 kg/m.Marland Kitchen Seen by dietician.  I agree with the assessment and plan as outlined below: Nutrition Status:        . Skin Assessment: I have examined the patient's skin and I agree with the wound assessment as performed by the wound care RN as outlined below:    Consultants:  GI-Dr. Carlean Purl was notified and will see patient when he gets to the colon. Cardiology-Dr. Humphrey Rolls from [facility] was notified.  Procedures:  None  Antimicrobials:  Anti-infectives (From admission, onward)    None         Subjective: Patient seen and examined in the ED, he feels better but feels tired because he did not have good sleep last night.  Objective: Vitals:   04/03/22 0330 04/03/22 0635 04/03/22 0945 04/03/22 1000  BP: 115/64 (!) 128/53  (!) 117/58  Pulse: 88 73 69 69  Resp: '16 18  18  '$ Temp: 98.5 F (36.9 C) 98.5 F (36.9 C)  98.5 F (36.9 C)  TempSrc: Oral Oral  Oral  SpO2: 95% 94% 90% 96%  Weight:      Height:        Intake/Output Summary (Last 24 hours) at 04/03/2022 1140 Last data filed at 04/03/2022 0714 Gross per 24 hour  Intake 831.22 ml  Output 600 ml  Net 231.22 ml   Filed Weights   04/02/22 1917  Weight: 83.9 kg    Examination:  General exam: Appears calm and comfortable  Respiratory system: Clear to auscultation. Respiratory effort normal. Cardiovascular system: S1 & S2 heard, RRR. No JVD, murmurs, rubs, gallops or clicks. No pedal edema. Gastrointestinal system: Abdomen is nondistended, soft and nontender. No organomegaly or masses felt. Normal bowel sounds heard. Central nervous system: Alert and oriented. No focal neurological deficits. Extremities: Symmetric 5 x 5 power. Skin: No rashes, lesions or ulcers Psychiatry: Judgement and insight  appear normal. Mood & affect appropriate.    Data Reviewed: I have personally reviewed following labs and imaging studies  CBC: Recent Labs  Lab 04/02/22 2001 04/03/22 0447  WBC 8.5 11.2*  HGB 7.8* 9.1*  HCT 26.2* 30.3*  MCV 77.5* 79.5*  PLT 309 373   Basic Metabolic Panel: Recent Labs  Lab 04/02/22 2001 04/03/22 0447  NA 133* 138  K 3.8 3.7  CL 97* 102  CO2 27 25  GLUCOSE 305* 114*  BUN 16 14  CREATININE 0.92 0.70  CALCIUM 8.3* 8.3*   GFR: Estimated Creatinine Clearance: 87.7 mL/min (by C-G formula based on SCr of 0.7 mg/dL). Liver Function Tests: Recent Labs  Lab 04/02/22 2136  AST 22  ALT 16  ALKPHOS 85  BILITOT 0.5  PROT 6.4*  ALBUMIN 3.4*   No results for input(s): "LIPASE", "AMYLASE" in the last 168 hours. No results for input(s): "AMMONIA" in the last 168 hours. Coagulation Profile: Recent Labs  Lab 04/02/22 2136  INR 1.1   Cardiac Enzymes: No results for input(s): "CKTOTAL", "CKMB", "CKMBINDEX", "  TROPONINI" in the last 168 hours. BNP (last 3 results) No results for input(s): "PROBNP" in the last 8760 hours. HbA1C: No results for input(s): "HGBA1C" in the last 72 hours. CBG: Recent Labs  Lab 04/02/22 2240 04/03/22 0740  GLUCAP 225* 135*   Lipid Profile: Recent Labs    04/03/22 0447  CHOL 98  HDL 34*  LDLCALC 54  TRIG 52  CHOLHDL 2.9   Thyroid Function Tests: No results for input(s): "TSH", "T4TOTAL", "FREET4", "T3FREE", "THYROIDAB" in the last 72 hours. Anemia Panel: No results for input(s): "VITAMINB12", "FOLATE", "FERRITIN", "TIBC", "IRON", "RETICCTPCT" in the last 72 hours. Sepsis Labs: No results for input(s): "PROCALCITON", "LATICACIDVEN" in the last 168 hours.  No results found for this or any previous visit (from the past 240 hour(s)).   Radiology Studies: DG Chest 2 View  Result Date: 04/02/2022 CLINICAL DATA:  Chest pain. EXAM: CHEST - 2 VIEW COMPARISON:  12/22/2020. FINDINGS: The heart size and mediastinal contours  are within normal limits. There is atherosclerotic calcification of the aorta. The pulmonary vasculature is distended. No consolidation, effusion, or pneumothorax. Cervical spinal fusion hardware is noted. No acute osseous abnormality. IMPRESSION: Pulmonary vascular congestion. Electronically Signed   By: Brett Fairy M.D.   On: 04/02/2022 20:02    Scheduled Meds:  atorvastatin  80 mg Oral Daily   gabapentin  300-600 mg Oral BID   insulin aspart  0-5 Units Subcutaneous QHS   insulin aspart  0-9 Units Subcutaneous TID WC   metoprolol succinate  25 mg Oral QHS   nicotine  21 mg Transdermal Daily   Continuous Infusions:  pantoprazole 8 mg/hr (04/03/22 0714)     LOS: 1 day   Darliss Cheney, MD [facility]  04/03/2022, 11:40 AM   *Please note that this is a verbal dictation therefore any spelling or grammatical errors are due to the "[employer]" system interpretation.  Please page via Mascot and do not message via secure chat for urgent patient care matters. Secure chat can be used for non urgent patient care matters.  How to contact the [facility] Attending or Consulting provider [place] or covering provider during after hours [place], for this patient?  Check the care team in [facility] and look for a) attending/consulting [facility] provider listed and b) the [facility] team listed. Page or secure chat [phone]. Log into [email or web address] and use [place]'s universal password to access. If you do not have the password, please contact the hospital operator. Locate the [facility] provider you are looking for under [facility] and page to a number that you can be directly reached. If you still have difficulty reaching the provider, please page the [facility] (Director on Call) for the Hospitalists listed on amion for assistance.

## 2022-04-04 ENCOUNTER — Encounter (HOSPITAL_COMMUNITY)
Admission: EM | Disposition: A | Discharge status: Home / Self Care | Payer: Self-pay | Source: Home / Self Care | Attending: Internal Medicine

## 2022-04-04 ENCOUNTER — Inpatient Hospital Stay (HOSPITAL_COMMUNITY): Payer: Medicare Other | Admitting: Anesthesiology

## 2022-04-04 ENCOUNTER — Encounter (HOSPITAL_COMMUNITY): Payer: Self-pay | Admitting: Internal Medicine

## 2022-04-04 ENCOUNTER — Inpatient Hospital Stay (HOSPITAL_COMMUNITY): Payer: Medicare Other

## 2022-04-04 DIAGNOSIS — F418 Other specified anxiety disorders: Secondary | ICD-10-CM

## 2022-04-04 DIAGNOSIS — R195 Other fecal abnormalities: Secondary | ICD-10-CM | POA: Diagnosis not present

## 2022-04-04 DIAGNOSIS — D649 Anemia, unspecified: Secondary | ICD-10-CM

## 2022-04-04 DIAGNOSIS — I119 Hypertensive heart disease without heart failure: Secondary | ICD-10-CM

## 2022-04-04 DIAGNOSIS — D62 Acute posthemorrhagic anemia: Secondary | ICD-10-CM | POA: Diagnosis not present

## 2022-04-04 DIAGNOSIS — E785 Hyperlipidemia, unspecified: Secondary | ICD-10-CM

## 2022-04-04 DIAGNOSIS — I10 Essential (primary) hypertension: Secondary | ICD-10-CM

## 2022-04-04 DIAGNOSIS — D509 Iron deficiency anemia, unspecified: Secondary | ICD-10-CM

## 2022-04-04 DIAGNOSIS — R710 Precipitous drop in hematocrit: Secondary | ICD-10-CM

## 2022-04-04 DIAGNOSIS — I350 Nonrheumatic aortic (valve) stenosis: Secondary | ICD-10-CM

## 2022-04-04 DIAGNOSIS — F17210 Nicotine dependence, cigarettes, uncomplicated: Secondary | ICD-10-CM

## 2022-04-04 DIAGNOSIS — R079 Chest pain, unspecified: Secondary | ICD-10-CM

## 2022-04-04 HISTORY — PX: ESOPHAGOGASTRODUODENOSCOPY (EGD) WITH PROPOFOL: SHX5813

## 2022-04-04 LAB — CBC WITH DIFFERENTIAL/PLATELET
Abs Immature Granulocytes: 0.02 10*3/uL (ref 0.00–0.07)
Basophils Absolute: 0.1 10*3/uL (ref 0.0–0.1)
Basophils Relative: 1 %
Eosinophils Absolute: 0.4 10*3/uL (ref 0.0–0.5)
Eosinophils Relative: 5 %
HCT: 30.9 % — ABNORMAL LOW (ref 39.0–52.0)
Hemoglobin: 9.4 g/dL — ABNORMAL LOW (ref 13.0–17.0)
Immature Granulocytes: 0 %
Lymphocytes Relative: 24 %
Lymphs Abs: 2 10*3/uL (ref 0.7–4.0)
MCH: 23.7 pg — ABNORMAL LOW (ref 26.0–34.0)
MCHC: 30.4 g/dL (ref 30.0–36.0)
MCV: 77.8 fL — ABNORMAL LOW (ref 80.0–100.0)
Monocytes Absolute: 0.7 10*3/uL (ref 0.1–1.0)
Monocytes Relative: 9 %
Neutro Abs: 5.2 10*3/uL (ref 1.7–7.7)
Neutrophils Relative %: 61 %
Platelets: 276 10*3/uL (ref 150–400)
RBC: 3.97 MIL/uL — ABNORMAL LOW (ref 4.22–5.81)
RDW: 17.4 % — ABNORMAL HIGH (ref 11.5–15.5)
WBC: 8.5 10*3/uL (ref 4.0–10.5)
nRBC: 0 % (ref 0.0–0.2)

## 2022-04-04 LAB — BPAM RBC
Blood Product Expiration Date: 202402112359
Blood Product Expiration Date: 202402112359
ISSUE DATE / TIME: 202401192313
ISSUE DATE / TIME: 202401200127
Unit Type and Rh: 6200
Unit Type and Rh: 6200

## 2022-04-04 LAB — IRON AND TIBC
Iron: 31 ug/dL — ABNORMAL LOW (ref 45–182)
Saturation Ratios: 8 % — ABNORMAL LOW (ref 17.9–39.5)
TIBC: 412 ug/dL (ref 250–450)
UIBC: 381 ug/dL

## 2022-04-04 LAB — TYPE AND SCREEN
ABO/RH(D): A POS
Antibody Screen: NEGATIVE
Unit division: 0
Unit division: 0

## 2022-04-04 LAB — GLUCOSE, CAPILLARY
Glucose-Capillary: 126 mg/dL — ABNORMAL HIGH (ref 70–99)
Glucose-Capillary: 119 mg/dL — ABNORMAL HIGH (ref 70–99)
Glucose-Capillary: 102 mg/dL — ABNORMAL HIGH (ref 70–99)
Glucose-Capillary: 85 mg/dL (ref 70–99)

## 2022-04-04 LAB — TROPONIN I (HIGH SENSITIVITY): Troponin I (High Sensitivity): 16 ng/L (ref <18)

## 2022-04-04 LAB — MAGNESIUM: Magnesium: 2 mg/dL (ref 1.7–2.4)

## 2022-04-04 LAB — BASIC METABOLIC PANEL
Anion gap: 13 (ref 5–15)
BUN: 7 mg/dL — ABNORMAL LOW (ref 8–23)
CO2: 22 mmol/L (ref 22–32)
Calcium: 9 mg/dL (ref 8.9–10.3)
Chloride: 105 mmol/L (ref 98–111)
Creatinine, Ser: 0.67 mg/dL (ref 0.61–1.24)
GFR, Estimated: >60 mL/min (ref >60)
Glucose, Bld: 115 mg/dL — ABNORMAL HIGH (ref 70–99)
Potassium: 3.6 mmol/L (ref 3.5–5.1)
Sodium: 140 mmol/L (ref 135–145)

## 2022-04-04 LAB — FERRITIN: Ferritin: 9 ng/mL — ABNORMAL LOW (ref 24–336)

## 2022-04-04 LAB — BRAIN NATRIURETIC PEPTIDE: B Natriuretic Peptide: 612.9 pg/mL — ABNORMAL HIGH (ref 0.0–100.0)

## 2022-04-04 SURGERY — ESOPHAGOGASTRODUODENOSCOPY (EGD) WITH PROPOFOL
Anesthesia: Monitor Anesthesia Care

## 2022-04-04 MED ORDER — LACTATED RINGERS IV SOLN: INTRAVENOUS | Status: DC | PRN

## 2022-04-04 MED ORDER — PEG-KCL-NACL-NASULF-NA ASC-C 100 G PO SOLR: 0.5000 | Freq: Once | ORAL | Status: DC

## 2022-04-04 MED ORDER — PEG-KCL-NACL-NASULF-NA ASC-C 100 G PO SOLR
0.5000 | Freq: Once | ORAL | Status: AC
  Administered 2022-04-04: 100 g via ORAL
  Filled 2022-04-04: qty 1

## 2022-04-04 MED ORDER — LACTATED RINGERS IV SOLN: INTRAVENOUS | Status: DC

## 2022-04-04 MED ORDER — METOCLOPRAMIDE HCL 5 MG/ML IJ SOLN
10.0000 mg | Freq: Once | INTRAMUSCULAR | Status: AC
  Administered 2022-04-04: 10 mg via INTRAVENOUS
  Filled 2022-04-04: qty 2

## 2022-04-04 MED ORDER — PROPOFOL 500 MG/50ML IV EMUL
INTRAVENOUS | Status: DC | PRN
  Administered 2022-04-04: 150 ug/kg/min via INTRAVENOUS

## 2022-04-04 MED ORDER — PHENYLEPHRINE 80 MCG/ML (10ML) SYRINGE FOR IV PUSH (FOR BLOOD PRESSURE SUPPORT)
PREFILLED_SYRINGE | INTRAVENOUS | Status: DC | PRN
  Administered 2022-04-04: 80 ug via INTRAVENOUS

## 2022-04-04 MED ORDER — BISACODYL 5 MG PO TBEC
20.0000 mg | DELAYED_RELEASE_TABLET | Freq: Once | ORAL | Status: AC
  Administered 2022-04-04: 20 mg via ORAL
  Filled 2022-04-04: qty 4

## 2022-04-04 MED ORDER — PROPOFOL 10 MG/ML IV BOLUS
INTRAVENOUS | Status: DC | PRN
  Administered 2022-04-04: 15 mg via INTRAVENOUS
  Administered 2022-04-04: 20 mg via INTRAVENOUS

## 2022-04-04 MED ORDER — SODIUM CHLORIDE 0.9 % IV SOLN: INTRAVENOUS | Status: DC

## 2022-04-04 MED ORDER — LIDOCAINE 2% (20 MG/ML) 5 ML SYRINGE
INTRAMUSCULAR | Status: DC | PRN
  Administered 2022-04-04: 40 mg via INTRAVENOUS
  Administered 2022-04-04: 60 mg via INTRAVENOUS

## 2022-04-04 SURGICAL SUPPLY — 15 items

## 2022-04-04 NOTE — Progress Notes (Signed)
OT Cancellation Note  Patient Details Name: QUILL GRINDER MRN: [id number] DOB: 1950-12-29   Cancelled Treatment:    Reason Eval/Treat Not Completed: OT screened, no needs identified, will sign off  PT seeing this pt and reporting independent in ADL and IADL. Will sign off. Re-consult if change in status.  Elder Cyphers, OTR/L [facility] Acute Rehabilitation Office: [phone]   Magnus Ivan 04/04/2022, 5:24 PM

## 2022-04-04 NOTE — Anesthesia Preprocedure Evaluation (Addendum)
Anesthesia Evaluation  Patient identified by MRN, date of birth, ID band Patient awake    Reviewed: Allergy & Precautions, NPO status , Patient's Chart, lab work & pertinent test results, reviewed documented beta blocker date and time   Airway Mallampati: III  TM Distance: >3 FB Neck ROM: Full    Dental  (+) Teeth Intact, Dental Advisory Given, Poor Dentition   Pulmonary sleep apnea (no CPAP) , Current Smoker and Patient abstained from smoking.   Pulmonary exam normal breath sounds clear to auscultation       Cardiovascular hypertension, Pt. on home beta blockers and Pt. on medications + CAD and +CHF  Normal cardiovascular exam+ Valvular Problems/Murmurs (mod AS) AS  Rhythm:Regular Rate:Normal     Neuro/Psych  PSYCHIATRIC DISORDERS Anxiety Depression    negative neurological ROS     GI/Hepatic negative GI ROS, Neg liver ROS,,,  Endo/Other  diabetes, Type 2, Oral Hypoglycemic Agents    Renal/GU negative Renal ROS  negative genitourinary   Musculoskeletal  (+) Arthritis ,    Abdominal   Peds  Hematology  (+) Blood dyscrasia, anemia Lab Results      Component                Value               Date                      WBC                      8.5                 04/04/2022                HGB                      9.4 (L)             04/04/2022                HCT                      30.9 (L)            04/04/2022                MCV                      77.8 (L)            04/04/2022                PLT                      276                 04/04/2022              Anesthesia Other Findings   Reproductive/Obstetrics                             Anesthesia Physical Anesthesia Plan  ASA: 3  Anesthesia Plan: MAC   Post-op Pain Management:    Induction: Intravenous  PONV Risk Score and Plan: Propofol infusion and Treatment may vary due to age or medical condition  Airway Management  Planned: Natural Airway  Additional Equipment:   Intra-op Plan:  Post-operative Plan:   Informed Consent: I have reviewed the patients History and Physical, chart, labs and discussed the procedure including the risks, benefits and alternatives for the proposed anesthesia with the patient or authorized representative who has indicated his/her understanding and acceptance.     Dental advisory given  Plan Discussed with: CRNA  Anesthesia Plan Comments:        Anesthesia Quick Evaluation

## 2022-04-04 NOTE — Consult Note (Addendum)
Consultation Note   Referring Provider:  [facility] PCP: Wendie Agreste, MD Primary Gastroenterologist: Dr. Jenetta Downer        Reason for consultation: anemia, FOBT+   Hospital Day: 3  Assessment    72 yo male with new microcytic anemia / FOBT+. No overt GI bleeding, stools are not dark. No abdominal pain, N/V or other GI symptoms Hgb 7.8, down from baseline of 14.6. Received 2 units of blood yesterday. Hgb improved to 9.4. DDx:  Recurrent PUD?  AVMs? Colon neoplasm seems unlikely though need to consider given inadequate prep on colonoscopy a year ago.   Hx of adenomatous colon polyps. Last surveillance colonoscopy Dec 2022 with inadequate bowel prep  Chest pain / Hx of CAD .Chest pain resolved and was possibly secondary to anemia but Cardiology to see him  GERD without Barrett's .  Asymptomatic and not on PPI for last two year  See PMH for additional medical problems   Plan   Will obtain iron studies  When deemed stable from cardiac standpoint he will need EGD. May also proceed with a colonoscopy given hx of polyps and inadequate prep in 2022 Continue PPI  ----------------------------------------------------------------------------------------------------------- GI attending:  I have also seen and evaluate the patient. Will evaluate with EGD and determine next steps. The risks and benefits as well as alternatives of endoscopic procedure(s) have been discussed and reviewed. All questions answered. The patient agrees to proceed.  Gatha Mayer, MD, [facility] See AMION on call - gastroenterology for best contact person 04/04/2022 12:59 PM   HPI   Patient is a  72 yo male with a PMH of anxiety, DM, HTN, chronic diastolic HF, OSA, Aortic stenosis, prostate cancer, tobacco use, GERD, PUD and colon polyps. See PMH for any additional medical problems.  Patient admitted to [facility] 1/19 with chest  pain and dyspnea.  EKG with some ST depression, his troponins were negative. The chest pain resolved He was transferred to [facility] at discussing case with Cardiology. . Found to be anemic, Heme positive. He hasn't had any overt GI bleeding. No dark stools. No abdominal pain, N/V or other GI symptoms. Off PPI for last two years and hasn't had any GERD symptoms. Weight stable.   Previous GI Evaluation     EGD Aug 2021    Colonoscopy Dec 2022    FINAL MICROSCOPIC DIAGNOSIS:   A. COLON, TRANSVERSE, POLYPECTOMY:  - Tubular adenoma  - Negative for high-grade dysplasia or malignancy   B. COLON, ASCENDING, POLYPECTOMY:  - Tubular adenoma(s)  - Negative for high-grade dysplasia or malignancy   C. COLON, CECAL, POLYPECTOMY:  - Tubular adenoma(s)  - Negative for high-grade dysplasia or malignancy   Recent Labs and Imaging ECHOCARDIOGRAM COMPLETE  Result Date: 04/03/2022    ECHOCARDIOGRAM REPORT   Patient Name:   Jonathan Gilmore Date of Exam: 04/03/2022 Medical Rec #:  [id number]      Height:       67.0 in Accession #:    [id number]     Weight:       185.0 lb Date of Birth:  1951-01-14      BSA:          1.956 m Patient Age:  71 years       BP:           128/53 mmHg Patient Gender: M              HR:           73 bpm. Exam Location:  [facility] Procedure: 2D Echo, Cardiac Doppler and Color Doppler Indications:    Chest Pain R07.9  History:        Patient has prior history of Echocardiogram examinations, most                 recent 09/10/2020. CHF, CAD, Aortic Valve Disease,                 Signs/Symptoms:Syncope; Risk Factors:Hypertension, Diabetes,                 Dyslipidemia and Current Smoker.  Sonographer:    Alvino Chapel RCS Referring Phys: [id number] [place] Comments: Patient had a difficult time lying on left side. IMPRESSIONS  1. Left ventricular ejection fraction, by estimation, is 55 to 60%. The left ventricle has normal function. The left ventricle demonstrates regional  wall motion abnormalities (see scoring diagram/findings for description). There is moderate left ventricular hypertrophy. Left ventricular diastolic parameters are consistent with Grade I diastolic dysfunction (impaired relaxation).  2. Right ventricular systolic function is normal. The right ventricular size is normal.  3. Left atrial size was severely dilated.  4. Right atrial size was mildly dilated.  5. The mitral valve is normal in structure. Mild mitral valve regurgitation. No evidence of mitral stenosis.  6. Prior AV mean gradient 17 mmHg. The aortic valve is calcified. There is moderate calcification of the aortic valve. There is moderate thickening of the aortic valve. Aortic valve regurgitation is not visualized. Moderate aortic valve stenosis. Aortic  valve mean gradient measures 23.0 mmHg. Aortic valve Vmax measures 3.08 m/s.  7. The inferior vena cava is dilated in size with <50% respiratory variability, suggesting right atrial pressure of 15 mmHg.  8. Evidence of atrial level shunting detected by color flow Doppler. There is a small patent foramen ovale with predominantly left to right shunting across the atrial septum. Comparison(s): Prior images reviewed side by side. FINDINGS  Left Ventricle: Left ventricular ejection fraction, by estimation, is 55 to 60%. The left ventricle has normal function. The left ventricle demonstrates regional wall motion abnormalities. The left ventricular internal cavity size was normal in size. There is moderate left ventricular hypertrophy. Left ventricular diastolic parameters are consistent with Grade I diastolic dysfunction (impaired relaxation).  LV Wall Scoring: The basal inferior segment is akinetic. Right Ventricle: The right ventricular size is normal. No increase in right ventricular wall thickness. Right ventricular systolic function is normal. Left Atrium: Left atrial size was severely dilated. Right Atrium: Right atrial size was mildly dilated. Pericardium:  There is no evidence of pericardial effusion. Mitral Valve: The mitral valve is normal in structure. Mild mitral valve regurgitation. No evidence of mitral valve stenosis. Tricuspid Valve: The tricuspid valve is normal in structure. Tricuspid valve regurgitation is not demonstrated. No evidence of tricuspid stenosis. Aortic Valve: Prior AV mean gradient 17 mmHg. The aortic valve is calcified. There is moderate calcification of the aortic valve. There is moderate thickening of the aortic valve. Aortic valve regurgitation is not visualized. Moderate aortic stenosis is present. Aortic valve mean gradient measures 23.0 mmHg. Aortic valve peak gradient measures 37.9 mmHg. Aortic valve area, by VTI measures 0.90 cm.  Pulmonic Valve: The pulmonic valve was normal in structure. Pulmonic valve regurgitation is trivial. No evidence of pulmonic stenosis. Aorta: The aortic root is normal in size and structure. Venous: The inferior vena cava is dilated in size with less than 50% respiratory variability, suggesting right atrial pressure of 15 mmHg. IAS/Shunts: Evidence of atrial level shunting detected by color flow Doppler. A small patent foramen ovale is detected with predominantly left to right shunting across the atrial septum.  LEFT VENTRICLE PLAX 2D LVIDd:         4.90 cm   Diastology LVIDs:         3.40 cm   LV e' medial:    7.51 cm/s LV PW:         1.40 cm   LV E/e' medial:  18.5 LV IVS:        1.30 cm   LV e' lateral:   6.53 cm/s LVOT diam:     2.10 cm   LV E/e' lateral: 21.3 LV SV:         73 LV SV Index:   38 LVOT Area:     3.46 cm  RIGHT VENTRICLE RV S prime:     17.50 cm/s TAPSE (M-mode): 2.8 cm LEFT ATRIUM              Index        RIGHT ATRIUM           Index LA diam:        4.40 cm  2.25 cm/m   RA Area:     23.10 cm LA Vol (A2C):   93.9 ml  48.00 ml/m  RA Volume:   72.80 ml  37.21 ml/m LA Vol (A4C):   138.0 ml 70.54 ml/m LA Biplane Vol: 121.0 ml 61.85 ml/m  AORTIC VALVE AV Area (Vmax):    0.95 cm AV Area  (Vmean):   0.86 cm AV Area (VTI):     0.90 cm AV Vmax:           308.00 cm/s AV Vmean:          224.000 cm/s AV VTI:            0.814 m AV Peak Grad:      37.9 mmHg AV Mean Grad:      23.0 mmHg LVOT Vmax:         84.20 cm/s LVOT Vmean:        55.600 cm/s LVOT VTI:          0.212 m LVOT/AV VTI ratio: 0.26  AORTA Ao Root diam: 3.20 cm MITRAL VALVE MV Area (PHT): 3.65 cm     SHUNTS MV Decel Time: 208 msec     Systemic VTI:  0.21 m MV E velocity: 139.00 cm/s  Systemic Diam: 2.10 cm MV A velocity: 58.70 cm/s MV E/A ratio:  2.37 Candee Furbish MD Electronically signed by Candee Furbish MD Signature Date/Time: 04/03/2022/1:42:52 PM    Final    DG Chest 2 View  Result Date: 04/02/2022 CLINICAL DATA:  Chest pain. EXAM: CHEST - 2 VIEW COMPARISON:  12/22/2020. FINDINGS: The heart size and mediastinal contours are within normal limits. There is atherosclerotic calcification of the aorta. The pulmonary vasculature is distended. No consolidation, effusion, or pneumothorax. Cervical spinal fusion hardware is noted. No acute osseous abnormality. IMPRESSION: Pulmonary vascular congestion. Electronically Signed   By: Brett Fairy M.D.   On: 04/02/2022 20:02    Labs:  Recent Labs    04/03/22  1287 04/03/22 1645 04/04/22 0642  WBC 11.2* 9.4 8.5  HGB 9.1* 9.3* 9.4*  HCT 30.3* 30.7* 30.9*  PLT 263 305 276   Recent Labs    04/02/22 2001 04/03/22 0447 04/04/22 0642  NA 133* 138 140  K 3.8 3.7 3.6  CL 97* 102 105  CO2 '27 25 22  '$ GLUCOSE 305* 114* 115*  BUN 16 14 7*  CREATININE 0.92 0.70 0.67  CALCIUM 8.3* 8.3* 9.0   Recent Labs    04/02/22 2136  PROT 6.4*  ALBUMIN 3.4*  AST 22  ALT 16  ALKPHOS 68  BILITOT 0.5  BILIDIR <0.1  IBILI NOT CALCULATED   Recent Labs    04/02/22 2136  LABPROT 14.0  INR 1.1    Past Medical History:  Diagnosis Date   Abnormal cardiovascular stress test 03/2013   Anxiety    Arthritis    Back pain    CHF (congestive heart failure) (HCC)    Coronary artery disease     Multivessel with significant LAD involvement by chest CT 2014   Depression    Diastolic dysfunction    Essential hypertension    Lumbar herniated disc    L4-L5   MRSA (methicillin resistant staph aureus) culture positive    Neuropathy    Shortness of breath dyspnea    Sleep apnea    No cpap   Type 2 diabetes mellitus ([place])     Past Surgical History:  Procedure Laterality Date   ANTERIOR CERVICAL DECOMP/DISCECTOMY FUSION  01/18/2011   Procedure: ANTERIOR CERVICAL DECOMPRESSION/DISCECTOMY FUSION 2 LEVELS;  Surgeon: Cooper Render Pool;  Location: [facility];  Service: Neurosurgery;  Laterality: N/A;  anterior cervical discectomy and fusion with allograft and plating, cervical five-six, cervical six-seven   BIOPSY N/A 04/04/2015   Procedure: BIOPSY;  Surgeon: Rogene Houston, MD;  [facility]: [facility];  Service: Endoscopy;  Laterality: N/A;   BIOPSY  11/09/2019   Procedure: BIOPSY;  Surgeon: Harvel Quale, MD;  [facility]: [facility];  Service: Gastroenterology;;   Bone spur removed  1997   Left shoulder   CARDIAC CATHETERIZATION N/A 08/08/2015   Procedure: Right/Left Heart Cath and Coronary Angiography;  Surgeon: Sherren Mocha, MD;  Location: [facility];  Service: Cardiovascular;  Laterality: N/A;   CARPAL TUNNEL RELEASE Right    COLONOSCOPY WITH PROPOFOL N/A 11/09/2019   Procedure: COLONOSCOPY WITH PROPOFOL;  Surgeon: Harvel Quale, MD;  [facility]: [facility];  Service: Gastroenterology;  Laterality: N/A;  900   COLONOSCOPY WITH PROPOFOL N/A 02/27/2021   Procedure: COLONOSCOPY WITH PROPOFOL;  Surgeon: Harvel Quale, MD;  [facility]: [facility];  Service: Gastroenterology;  Laterality: N/A;  7:30   ESOPHAGEAL DILATION N/A 04/04/2015   Procedure: ESOPHAGEAL DILATION;  Surgeon: Rogene Houston, MD;  [facility];  Service: Endoscopy;  Laterality: N/A;   ESOPHAGEAL DILATION N/A 11/09/2019   Procedure: ESOPHAGEAL DILATION;  Surgeon:  Harvel Quale, MD;  [facility];  Service: Gastroenterology;  Laterality: N/A;   ESOPHAGOGASTRODUODENOSCOPY N/A 04/04/2015   Procedure: ESOPHAGOGASTRODUODENOSCOPY (EGD);  Surgeon: Rogene Houston, MD;  [facility];  Service: Endoscopy;  Laterality: N/A;  1240   ESOPHAGOGASTRODUODENOSCOPY (EGD) WITH PROPOFOL N/A 11/09/2019   Procedure: ESOPHAGOGASTRODUODENOSCOPY (EGD) WITH PROPOFOL;  Surgeon: Harvel Quale, MD;  [facility];  Service: Gastroenterology;  Laterality: N/A;   HEMORRHOID SURGERY     POLYPECTOMY  11/09/2019   Procedure: POLYPECTOMY;  Surgeon: Montez Morita,  Quillian Quince, MD;  [facility];  Service: Gastroenterology;;   POLYPECTOMY  02/27/2021   Procedure: POLYPECTOMY;  Surgeon: Harvel Quale, MD;  [facility];  Service: Gastroenterology;;   TONSILLECTOMY      Family History  Problem Relation Age of Onset   Lung cancer Mother    Diabetes Father    Hepatitis B Father    Diabetes Sister    Cushing syndrome Sister     Prior to Admission medications   Medication Sig Start Date End Date Taking? Authorizing Provider  aspirin EC 81 MG tablet Take 81 mg by mouth daily.   Yes [provider]  atorvastatin (LIPITOR) 80 MG tablet Take 1 tablet (80 mg total) by mouth daily. 10/22/21  Yes Wendie Agreste, MD  Calcium-Magnesium-Zinc (CAL-MAG-ZINC PO) Take 1 tablet by mouth daily.   Yes [provider]  Flaxseed, Linseed, (FLAXSEED OIL) 1000 MG CAPS Take 1,000 mg by mouth daily.   Yes [provider]  furosemide (LASIX) 40 MG tablet TAKE 1 TABLET BY MOUTH DAILY Patient taking differently: Take 40 mg by mouth daily as needed for fluid. 01/07/22  Yes BranchAlphonse Guild, MD  gabapentin (NEURONTIN) 300 MG capsule Take 1-2 capsules (300-600 mg total) by mouth 2 (two) times daily. Patient taking differently: Take 900-1,200 mg by mouth at bedtime. 10/22/21  Yes Wendie Agreste, MD   lisinopril (ZESTRIL) 20 MG tablet Take 1 tablet (20 mg total) by mouth daily. 02/23/21 10/16/78 Yes Wendie Agreste, MD  magnesium oxide (MAG-OX) 400 MG tablet Take 400 mg by mouth at bedtime.   Yes [provider]  metFORMIN (GLUCOPHAGE) 500 MG tablet TAKE 2 TABLETS BY MOUTH TWICE  DAILY 10/12/21  Yes Wendie Agreste, MD  metoprolol succinate (TOPROL-XL) 25 MG 24 hr tablet TAKE 1 TABLET BY MOUTH DAILY AT  BEDTIME 01/07/22  Yes Branch, Alphonse Guild, MD  Multiple Vitamin (MULTIVITAMIN WITH MINERALS) TABS Take 1 tablet by mouth daily.   Yes [provider]  nitroGLYCERIN (NITROSTAT) 0.4 MG SL tablet Place 1 tablet (0.4 mg total) under the tongue every 5 (five) minutes x 3 doses as needed for chest pain (if no relief after 2nd dose, proceed to ED for an evaluation). 10/15/21  Yes BranchAlphonse Guild, MD  omeprazole (PRILOSEC) 40 MG capsule Take 1 capsule (40 mg total) by mouth daily. 03/17/21  Yes Harvel Quale, MD  Potassium 99 MG TABS Take 1 tablet by mouth every evening.   Yes [provider]  ACCU-CHEK AVIVA PLUS test strip USE AS INSTRUCTED 06/13/19   Maryruth Hancock, MD  chlorhexidine (HIBICLENS) 4 % external liquid Apply topically daily as needed. Patient not taking: Reported on 12/17/2021 08/14/21   Volney American, PA-C  mupirocin ointment (BACTROBAN) 2 % Apply 1 application. topically 2 (two) times daily. Patient not taking: Reported on 12/17/2021 08/14/21   Volney American, PA-C    Current Facility-Administered Medications  Medication Dose Route Frequency Provider Last Rate Last Admin   acetaminophen (TYLENOL) tablet 650 mg  650 mg Oral Q6H PRN Elgergawy, Silver Huguenin, MD       Or   acetaminophen (TYLENOL) suppository 650 mg  650 mg Rectal Q6H PRN Elgergawy, Silver Huguenin, MD       albuterol (PROVENTIL) (2.5 MG/3ML) 0.083% nebulizer solution 2.5 mg  2.5 mg Nebulization Q2H PRN Elgergawy, Silver Huguenin, MD       atorvastatin (LIPITOR) tablet 80 mg  80 mg Oral  Daily  Elgergawy, Silver Huguenin, MD   80 mg at 04/03/22 1021   furosemide (LASIX) injection 20 mg  20 mg Intravenous Once PRN Elgergawy, Silver Huguenin, MD       furosemide (LASIX) injection 20 mg  20 mg Intravenous Once PRN Elgergawy, Silver Huguenin, MD       gabapentin (NEURONTIN) capsule 300-600 mg  300-600 mg Oral BID Elgergawy, Silver Huguenin, MD   600 mg at 04/03/22 2011   insulin aspart (novoLOG) injection 0-5 Units  0-5 Units Subcutaneous QHS Elgergawy, Silver Huguenin, MD   2 Units at 04/02/22 2245   insulin aspart (novoLOG) injection 0-9 Units  0-9 Units Subcutaneous TID WC Elgergawy, Silver Huguenin, MD   1 Units at 04/04/22 4481   melatonin tablet 3 mg  3 mg Oral QHS PRN Etta Quill, DO   3 mg at 04/03/22 2348   metoprolol succinate (TOPROL-XL) 24 hr tablet 25 mg  25 mg Oral QHS Elgergawy, Silver Huguenin, MD   25 mg at 04/03/22 2011   nicotine (NICODERM CQ - dosed in mg/24 hours) patch 21 mg  21 mg Transdermal Daily Elgergawy, Silver Huguenin, MD   21 mg at 04/03/22 1025   nitroGLYCERIN (NITROSTAT) SL tablet 0.4 mg  0.4 mg Sublingual Q5 min PRN Lorretta Harp, MD   0.4 mg at 04/03/22 1905   pantoprozole (PROTONIX) 80 mg /NS 100 mL infusion  8 mg/hr Intravenous Continuous Margarita Mail, PA-C 10 mL/hr at 04/04/22 0403 8 mg/hr at 04/04/22 0403    Allergies as of 04/02/2022 - Review Complete 04/02/2022  Allergen Reaction Noted   Morphine and related Nausea And Vomiting 01/18/2011    Social History   Socioeconomic History   Marital status: Married    Spouse name: Not on file   Number of children: Not on file   Years of education: Not on file   Highest education level: Not on file  Occupational History   Not on file  Tobacco Use   Smoking status: Every Day    Packs/day: 1.00    Years: 50.00    Total pack years: 50.00    Types: Cigarettes    Start date: 11/26/1967   Smokeless tobacco: Former   Tobacco comments:    smoking x 50 yrs  Vaping Use   Vaping Use: Never used  Substance and Sexual Activity   Alcohol use:  Yes    Alcohol/week: 1.0 standard drink of alcohol    Types: 1 Standard drinks or equivalent per week    Comment: rare   Drug use: Yes    Frequency: 1.0 times per week    Types: Marijuana    Comment: as needed   Sexual activity: Yes  Other Topics Concern   Not on file  Social History Narrative   Lives with wife   Right Handed   Drinks 2-3 cups caffeine daily   Social Determinants of Health   Financial Resource Strain: Low Risk  (01/20/2022)   Overall Financial Resource Strain (CARDIA)    Difficulty of Paying Living Expenses: Not hard at all  Food Insecurity: No Food Insecurity (04/03/2022)   Hunger Vital Sign    Worried About Running Out of Food in the Last Year: Never true    Ran Out of Food in the Last Year: Never true  Transportation Needs: No Transportation Needs (04/03/2022)   PRAPARE - Hydrologist (Medical): No    Lack of Transportation (Non-Medical): No  Physical Activity: Inactive (01/20/2022)  Exercise Vital Sign    Days of Exercise per Week: 0 days    Minutes of Exercise per Session: 0 min  Stress: No Stress Concern Present (01/20/2022)   [place]    Feeling of Stress : Not at all  Social Connections: Moderately Integrated (01/20/2022)   Social Connection and Isolation Panel [NHANES]    Frequency of Communication with Friends and Family: More than three times a week    Frequency of Social Gatherings with Friends and Family: More than three times a week    Attends Religious Services: Never    Marine scientist or Organizations: No    Attends Music therapist: More than 4 times per year    Marital Status: Married  Human resources officer Violence: Not At Risk (04/03/2022)   Humiliation, Afraid, Rape, and Kick questionnaire    Fear of Current or Ex-Partner: No    Emotionally Abused: No    Physically Abused: No    Sexually Abused: No    Review of  Systems: All systems reviewed and negative except where noted in HPI.  Physical Exam: Vital signs in last 24 hours: Temp:  [97.7 F (36.5 C)-98.5 F (36.9 C)] 97.8 F (36.6 C) (01/21 0830) Pulse Rate:  [69-97] 72 (01/21 0830) Resp:  [18] 18 (01/21 0830) BP: (117-160)/(43-93) 130/51 (01/21 0830) SpO2:  [90 %-99 %] 96 % (01/21 0830) Weight:  [84.4 kg] 84.4 kg (01/21 0040) Last BM Date : 04/03/22  General:  Alert male in NAD Psych:  Pleasant, cooperative. Normal mood and affect Eyes: Pupils equal Ears:  Normal auditory acuity Nose: No deformity, discharge or lesions Neck:  Supple, no masses felt Lungs:  Clear to auscultation.  Heart:  Regular rate, regular rhythm.  Abdomen:  Soft, nondistended, nontender, active bowel sounds, no masses felt Rectal :  Deferred Msk: Symmetrical without gross deformities.  Neurologic:  Alert, oriented, grossly normal neurologically Extremities : No edema Skin:  Intact without significant lesions.    Intake/Output from previous day: 01/20 0701 - 01/21 0700 In: 269.8 [P.O.:100; I.V.:169.8] Out: 2425 [Urine:2425] Intake/Output this shift:  No intake/output data recorded.    Principal Problem:   Acute blood loss anemia Active Problems:   Chest pain   DM type 2 causing vascular disease (HCC)   Mixed hyperlipidemia   Diastolic CHF ([place])   Tobacco use   Essential hypertension, benign   Prostate cancer ([place])    Tye Savoy, NP-C @  04/04/2022, 8:43 AM

## 2022-04-04 NOTE — Transfer of Care (Signed)
Immediate Anesthesia Transfer of Care Note  Patient: Jonathan Gilmore  Procedure(s) Performed: ESOPHAGOGASTRODUODENOSCOPY (EGD) WITH PROPOFOL  Patient Location: Endoscopy Unit  Anesthesia Type:MAC  Level of Consciousness: drowsy and patient cooperative  Airway & Oxygen Therapy: Patient Spontanous Breathing  Post-op Assessment: Report given to RN and Post -op Vital signs reviewed and stable  Post vital signs: Reviewed and stable  Last Vitals:  Vitals Value Taken Time  BP 121/79 04/04/22 1316  Temp    Pulse 68 04/04/22 1318  Resp 20 04/04/22 1318  SpO2 98 % 04/04/22 1318  Vitals shown include unvalidated device data.  Last Pain:  Vitals:   04/04/22 1216  TempSrc: Temporal  PainSc:          Complications: No notable events documented.

## 2022-04-04 NOTE — Op Note (Signed)
[facility] Patient Name: Jonathan Gilmore Procedure Date : 04/04/2022 MRN: [id number] Attending MD: Gatha Mayer , MD, [id number] Date of Birth: November 25, 1950 CSN: [id number] Age: 72 Admit Type: Inpatient Procedure:                Upper GI endoscopy Indications:              Heme positive stool, Anemia Providers:                Janee Morn, Technician, Joelyn Oms, RN,                            Gatha Mayer, MD Referring MD:              Medicines:                Monitored Anesthesia Care Complications:            No immediate complications. Estimated Blood Loss:     Estimated blood loss: none. Procedure:                Pre-Anesthesia Assessment:                           - Prior to the procedure, a History and Physical                            was performed, and patient medications and                            allergies were reviewed. The patient's tolerance of                            previous anesthesia was also reviewed. The risks                            and benefits of the procedure and the sedation                            options and risks were discussed with the patient.                            All questions were answered, and informed consent                            was obtained. Prior Anticoagulants: The patient has                            taken no anticoagulant or antiplatelet agents. ASA                            Grade Assessment: III - A patient with severe                            systemic disease. After reviewing the risks and  benefits, the patient was deemed in satisfactory                            condition to undergo the procedure.                           After obtaining informed consent, the endoscope was                            passed under direct vision. Throughout the                            procedure, the patient's blood pressure, pulse, and                            oxygen  saturations were monitored continuously. The                            [id number]-[id number] ([id number] Olympus endoscope was introduced                            through the mouth, and advanced to the second part                            of duodenum. The upper GI endoscopy was                            accomplished without difficulty. The patient                            tolerated the procedure well. Scope In: Scope Out: Findings:      The esophagus was normal.      The stomach was normal.      The examined duodenum was normal.      The cardia and gastric fundus were normal on retroflexion. Impression:               - Normal esophagus.                           - Normal stomach.                           - Normal examined duodenum.                           - No specimens collected. Recommendation:           - Patient has a contact number available for                            emergencies. The signs and symptoms of potential                            delayed complications were discussed with the  patient. Return to normal activities tomorrow.                            Written discharge instructions were provided to the                            patient.                           - Clear liquid diet.                           - Since this does not explain his anemia and heme +                            stool + last 2 colonoscopies w/ fair preps will                            schedule for colonoscopy tomorrow Procedure Code(s):        --- Professional ---                           [phone], Esophagogastroduodenoscopy, flexible,                            transoral; diagnostic, including collection of                            specimen(s) by brushing or washing, when performed                            (separate procedure) Diagnosis Code(s):        --- Professional ---                           R19.5, Other fecal abnormalities                           D64.9,  Anemia, unspecified CPT copyright 2022 [employer]. All rights reserved. The codes documented in this report are preliminary and upon coder review may  be revised to meet current compliance requirements. Gatha Mayer, MD 04/04/2022 1:35:17 PM This report has been signed electronically. Number of Addenda: 0

## 2022-04-04 NOTE — Anesthesia Procedure Notes (Signed)
Procedure Name: MAC Date/Time: 04/04/2022 12:59 PM  Performed by: Janene Harvey, CRNAPre-anesthesia Checklist: Patient identified, Emergency Drugs available, Suction available and Patient being monitored Patient Re-evaluated:Patient Re-evaluated prior to induction Oxygen Delivery Method: Nasal cannula Induction Type: IV induction Placement Confirmation: positive ETCO2 Dental Injury: Teeth and Oropharynx as per pre-operative assessment

## 2022-04-04 NOTE — Anesthesia Postprocedure Evaluation (Signed)
Anesthesia Post Note  Patient: Jonathan Gilmore  Procedure(s) Performed: ESOPHAGOGASTRODUODENOSCOPY (EGD) WITH PROPOFOL     Patient location during evaluation: PACU Anesthesia Type: MAC Level of consciousness: awake and alert Pain management: pain level controlled Vital Signs Assessment: post-procedure vital signs reviewed and stable Respiratory status: spontaneous breathing, nonlabored ventilation and respiratory function stable Cardiovascular status: stable and blood pressure returned to baseline Anesthetic complications: no   No notable events documented.  Last Vitals:  Vitals:   04/04/22 1335 04/04/22 1614  BP: (!) 138/57 (!) 146/73  Pulse: 66 69  Resp: 15 20  Temp:  36.9 C  SpO2: 97% 97%    Last Pain:  Vitals:   04/04/22 1614  TempSrc: Oral  PainSc:                  Audry Pili

## 2022-04-04 NOTE — Progress Notes (Signed)
PROGRESS NOTE                                                                                                                                                                                                             Patient Demographics:    Jonathan Gilmore, is a 72 y.o. male, DOB - 1950-05-10, MLY:[id number]  Outpatient Primary MD for the patient is Wendie Agreste, MD    LOS - 2  Admit date - 04/02/2022    Chief Complaint  Patient presents with   Chest Pain       Brief Narrative (HPI from H&P)   72 y.o. male, with past medical history of chronic diastolic CHF, CAD on aspirin, OSA, hypertension, hyperlipidemia, aortic stenosis, diabetes mellitus, diabetic neuropathy, prostate cancer under observation by Dr. Alyson Ingles from urology.  Presented to [facility], ER with chief complaints of exertional chest pain in the ER he was found to be Hemoccult positive with subacute anemia hemoglobin dropped to 7.8 from 12.7, 6 months ago.  EKG had some ST depression.  He was transferred from [facility] to [facility] for cardiology and GI evaluation.   Subjective:    Jonathan Gilmore today has, No headache, No chest pain, No abdominal pain - No Nausea, No new weakness tingling or numbness, no SOB   Assessment  & Plan :   Symptomatic anemia.  Anemia likely subacute GI blood loss could be upper.  Causing acute on chronic anemia, baseline hemoglobin 12.6 has dropped to 7.8 over  5 to 6 months, he is s/p 2 units of packed RBC transfusion at any point with resolution of his symptoms.  Hemoccult positive hence getting GI evaluation, continue PPI.  Monitor H&H.  No signs of ongoing brisk acute bleeding.  Exertional angina.  Stable EKG after transfusion on 04/04/2022, troponin negative serially, echo demonstrates preserved EF with normal wall motion.  No acute issue cardiology to opine.  Do not think he requires any inpatient cardiac workup.  Chronic  diastolic CHF.  EF 60%.  Compensated.    Hypertension.  Stable on beta-blocker.  Dyslipidemia.  On statin.  OSA.  Noncompliant with CPAP.  PCP to monitor.  HX of moderate aortic stenosis.  Currently stable on echo.  History of PAD.  No acute issues.  Outpatient follow-up with vascular surgery.    Diabetic peripheral  neuropathy.  Continue gabapentin.    History of prostate cancer.  No acute issues follow with Dr. Alyson Ingles, follow-up postdischarge.    DM Type II.  Sliding scale.  Lab Results  Component Value Date   HGBA1C 8.3 (H) 04/02/2022   CBG (last 3)  Recent Labs    04/03/22 1640 04/03/22 2018 04/04/22 0631  GLUCAP 130* 162* 126*         Condition - Extremely Guarded  Family Communication  :  None present  Code Status :  Full  Consults  :  Cards, GI  PUD Prophylaxis : PPI   Procedures  :     TTE - 1. Left ventricular ejection fraction, by estimation, is 55 to 60%. The left ventricle has normal function. The left ventricle demonstrates regional wall motion abnormalities (see scoring diagram/findings for description). There is moderate left ventricular hypertrophy. Left ventricular diastolic parameters are consistent with Grade I diastolic dysfunction (impaired relaxation).  2. Right ventricular systolic function is normal. The right ventricular size is normal.  3. Left atrial size was severely dilated.  4. Right atrial size was mildly dilated.  5. The mitral valve is normal in structure. Mild mitral valve regurgitation. No evidence of mitral stenosis.  6. Prior AV mean gradient 17 mmHg. The aortic valve is calcified. There is moderate calcification of the aortic valve. There is moderate thickening of the aortic valve. Aortic valve regurgitation is not visualized. Moderate aortic valve stenosis. Aortic  valve mean gradient measures 23.0 mmHg. Aortic valve Vmax measures 3.08 m/s.  7. The inferior vena cava is dilated in size with <50% respiratory variability, suggesting  right atrial pressure of 15 mmHg.  8. Evidence of atrial level shunting detected by color flow Doppler. There is a small patent foramen ovale with predominantly left to right shunting across the atrial septum. Comparison(s): Prior images reviewed side by side.       Disposition Plan  :    Status is: Inpatient   DVT Prophylaxis  :    SCDs Start: 04/02/22 2154    Lab Results  Component Value Date   PLT 276 04/04/2022    Diet :  Diet Order             Diet NPO time specified  Diet effective midnight                    Inpatient Medications  Scheduled Meds:  atorvastatin  80 mg Oral Daily   gabapentin  300-600 mg Oral BID   insulin aspart  0-5 Units Subcutaneous QHS   insulin aspart  0-9 Units Subcutaneous TID WC   metoprolol succinate  25 mg Oral QHS   nicotine  21 mg Transdermal Daily   Continuous Infusions:  pantoprazole 8 mg/hr (04/04/22 0403)   PRN Meds:.acetaminophen **OR** acetaminophen, albuterol, furosemide, furosemide, melatonin, nitroGLYCERIN  Antibiotics  :    Anti-infectives (From admission, onward)    None         Objective:   Vitals:   04/03/22 2349 04/04/22 0040 04/04/22 0358 04/04/22 0830  BP: (!) 144/70 139/60 (!) 127/49 (!) 130/51  Pulse: 76 73 72 72  Resp: '18 18 18 18  '$ Temp: 97.7 F (36.5 C) 98.3 F (36.8 C) 97.9 F (36.6 C) 97.8 F (36.6 C)  TempSrc: Oral Oral Oral Oral  SpO2: 93% 92% 95% 96%  Weight:  84.4 kg    Height:        Wt Readings from Last 3 Encounters:  04/04/22 84.4 kg  02/03/22 86.3 kg  01/28/22 87.6 kg     Intake/Output Summary (Last 24 hours) at 04/04/2022 0849 Last data filed at 04/04/2022 0300 Gross per 24 hour  Intake 179.54 ml  Output 2425 ml  Net -2245.46 ml     Physical Exam  Awake Alert, No new F.N deficits, Normal affect [place].AT,PERRAL Supple Neck, No JVD,   Symmetrical Chest wall movement, Good air movement bilaterally, CTAB RRR,No Gallops,Rubs or new Murmurs,  +ve B.Sounds, Abd  Soft, No tenderness,   No Cyanosis, Clubbing or edema        Data Review:    Recent Labs  Lab 04/02/22 2001 04/03/22 0447 04/03/22 1645 04/04/22 0642  WBC 8.5 11.2* 9.4 8.5  HGB 7.8* 9.1* 9.3* 9.4*  HCT 26.2* 30.3* 30.7* 30.9*  PLT 309 263 305 276  MCV 77.5* 79.5* 77.7* 77.8*  MCH 23.1* 23.9* 23.5* 23.7*  MCHC 29.8* 30.0 30.3 30.4  RDW 16.9* 17.1* 17.2* 17.4*  LYMPHSABS  --   --  2.2 2.0  MONOABS  --   --  0.8 0.7  EOSABS  --   --  0.3 0.4  BASOSABS  --   --  0.1 0.1    Recent Labs  Lab 04/02/22 2001 04/02/22 2136 04/02/22 2238 04/03/22 0447 04/03/22 0854 04/04/22 0642  NA 133*  --   --  138  --  140  K 3.8  --   --  3.7  --  3.6  CL 97*  --   --  102  --  105  CO2 27  --   --  25  --  22  ANIONGAP 9  --   --  11  --  13  GLUCOSE 305*  --   --  114*  --  115*  BUN 16  --   --  14  --  7*  CREATININE 0.92  --   --  0.70  --  0.67  AST  --  22  --   --   --   --   ALT  --  16  --   --   --   --   ALKPHOS  --  85  --   --   --   --   BILITOT  --  0.5  --   --   --   --   ALBUMIN  --  3.4*  --   --   --   --   INR  --  1.1  --   --   --   --   HGBA1C  --   --  8.3*  --   --   --   BNP  --   --   --   --  227.0* 612.9*  MG  --   --   --   --   --  2.0  CALCIUM 8.3*  --   --  8.3*  --  9.0    Radiology Reports ECHOCARDIOGRAM COMPLETE  Result Date: 04/03/2022    ECHOCARDIOGRAM REPORT   Patient Name:   WILLOW RECZEK Date of Exam: 04/03/2022 Medical Rec #:  [id number]      Height:       67.0 in Accession #:    [id number]     Weight:       185.0 lb Date of Birth:  [birth year]      BSA:  1.956 m Patient Age:    65 years       BP:           128/53 mmHg Patient Gender: M              HR:           73 bpm. Exam Location:  [facility] Procedure: 2D Echo, Cardiac Doppler and Color Doppler Indications:    Chest Pain R07.9  History:        Patient has prior history of Echocardiogram examinations, most                 recent 09/10/2020. CHF, CAD, Aortic Valve Disease,                  Signs/Symptoms:Syncope; Risk Factors:Hypertension, Diabetes,                 Dyslipidemia and Current Smoker.  Sonographer:    Alvino Chapel RCS Referring Phys: [id number] [place] Comments: Patient had a difficult time lying on left side. IMPRESSIONS  1. Left ventricular ejection fraction, by estimation, is 55 to 60%. The left ventricle has normal function. The left ventricle demonstrates regional wall motion abnormalities (see scoring diagram/findings for description). There is moderate left ventricular hypertrophy. Left ventricular diastolic parameters are consistent with Grade I diastolic dysfunction (impaired relaxation).  2. Right ventricular systolic function is normal. The right ventricular size is normal.  3. Left atrial size was severely dilated.  4. Right atrial size was mildly dilated.  5. The mitral valve is normal in structure. Mild mitral valve regurgitation. No evidence of mitral stenosis.  6. Prior AV mean gradient 17 mmHg. The aortic valve is calcified. There is moderate calcification of the aortic valve. There is moderate thickening of the aortic valve. Aortic valve regurgitation is not visualized. Moderate aortic valve stenosis. Aortic  valve mean gradient measures 23.0 mmHg. Aortic valve Vmax measures 3.08 m/s.  7. The inferior vena cava is dilated in size with <50% respiratory variability, suggesting right atrial pressure of 15 mmHg.  8. Evidence of atrial level shunting detected by color flow Doppler. There is a small patent foramen ovale with predominantly left to right shunting across the atrial septum. Comparison(s): Prior images reviewed side by side. FINDINGS  Left Ventricle: Left ventricular ejection fraction, by estimation, is 55 to 60%. The left ventricle has normal function. The left ventricle demonstrates regional wall motion abnormalities. The left ventricular internal cavity size was normal in size. There is moderate left ventricular hypertrophy. Left  ventricular diastolic parameters are consistent with Grade I diastolic dysfunction (impaired relaxation).  LV Wall Scoring: The basal inferior segment is akinetic. Right Ventricle: The right ventricular size is normal. No increase in right ventricular wall thickness. Right ventricular systolic function is normal. Left Atrium: Left atrial size was severely dilated. Right Atrium: Right atrial size was mildly dilated. Pericardium: There is no evidence of pericardial effusion. Mitral Valve: The mitral valve is normal in structure. Mild mitral valve regurgitation. No evidence of mitral valve stenosis. Tricuspid Valve: The tricuspid valve is normal in structure. Tricuspid valve regurgitation is not demonstrated. No evidence of tricuspid stenosis. Aortic Valve: Prior AV mean gradient 17 mmHg. The aortic valve is calcified. There is moderate calcification of the aortic valve. There is moderate thickening of the aortic valve. Aortic valve regurgitation is not visualized. Moderate aortic stenosis is present. Aortic valve mean gradient measures 23.0 mmHg. Aortic valve peak gradient measures 37.9 mmHg. Aortic  valve area, by VTI measures 0.90 cm. Pulmonic Valve: The pulmonic valve was normal in structure. Pulmonic valve regurgitation is trivial. No evidence of pulmonic stenosis. Aorta: The aortic root is normal in size and structure. Venous: The inferior vena cava is dilated in size with less than 50% respiratory variability, suggesting right atrial pressure of 15 mmHg. IAS/Shunts: Evidence of atrial level shunting detected by color flow Doppler. A small patent foramen ovale is detected with predominantly left to right shunting across the atrial septum.  LEFT VENTRICLE PLAX 2D LVIDd:         4.90 cm   Diastology LVIDs:         3.40 cm   LV e' medial:    7.51 cm/s LV PW:         1.40 cm   LV E/e' medial:  18.5 LV IVS:        1.30 cm   LV e' lateral:   6.53 cm/s LVOT diam:     2.10 cm   LV E/e' lateral: 21.3 LV SV:         73 LV  SV Index:   38 LVOT Area:     3.46 cm  RIGHT VENTRICLE RV S prime:     17.50 cm/s TAPSE (M-mode): 2.8 cm LEFT ATRIUM              Index        RIGHT ATRIUM           Index LA diam:        4.40 cm  2.25 cm/m   RA Area:     23.10 cm LA Vol (A2C):   93.9 ml  48.00 ml/m  RA Volume:   72.80 ml  37.21 ml/m LA Vol (A4C):   138.0 ml 70.54 ml/m LA Biplane Vol: 121.0 ml 61.85 ml/m  AORTIC VALVE AV Area (Vmax):    0.95 cm AV Area (Vmean):   0.86 cm AV Area (VTI):     0.90 cm AV Vmax:           308.00 cm/s AV Vmean:          224.000 cm/s AV VTI:            0.814 m AV Peak Grad:      37.9 mmHg AV Mean Grad:      23.0 mmHg LVOT Vmax:         84.20 cm/s LVOT Vmean:        55.600 cm/s LVOT VTI:          0.212 m LVOT/AV VTI ratio: 0.26  AORTA Ao Root diam: 3.20 cm MITRAL VALVE MV Area (PHT): 3.65 cm     SHUNTS MV Decel Time: 208 msec     Systemic VTI:  0.21 m MV E velocity: 139.00 cm/s  Systemic Diam: 2.10 cm MV A velocity: 58.70 cm/s MV E/A ratio:  2.37 Candee Furbish MD Electronically signed by Candee Furbish MD Signature Date/Time: 04/03/2022/1:42:52 PM    Final    DG Chest 2 View  Result Date: 04/02/2022 CLINICAL DATA:  Chest pain. EXAM: CHEST - 2 VIEW COMPARISON:  12/22/2020. FINDINGS: The heart size and mediastinal contours are within normal limits. There is atherosclerotic calcification of the aorta. The pulmonary vasculature is distended. No consolidation, effusion, or pneumothorax. Cervical spinal fusion hardware is noted. No acute osseous abnormality. IMPRESSION: Pulmonary vascular congestion. Electronically Signed   By: Brett Fairy M.D.   On: 04/02/2022 20:02  Signature  -   Lala Lund M.D on 04/04/2022 at 8:49 AM   -  To page go to [email or web address]

## 2022-04-04 NOTE — Consult Note (Addendum)
Cardiology Consultation   Patient ID: Jonathan Gilmore MRN: [id number]; DOB: 1950-10-24  Admit date: 04/02/2022 Date of Consult: 04/04/2022  PCP:  Wendie Agreste, MD   Taylor, jon  Patient Profile:   Jonathan Gilmore is a 72 y.o. male with a hx  of  who is being seen 04/04/2022 for the evaluation of  at the request of  History of Present Illness:   Jonathan Gilmore  is a 72 yo with hx of CAD  Cardiac catheterization in may 2017 50% LAD dz. Nuclear stress test in 2022:  small inferior infarct  No ischemia     He also has a hx of HTN, has presented with pulmonary edema in past  in addition he has a hx of HL, Aortic stenosis (mild to moderate with mean gradient of 17 mm Hg),Diastolic dysfunction, severe OSA, (intolerant to CPAP), dysphagia, \\  The pt follows with J Branch   Last seen in clnic in Oct 2023    The pt developed recent CP and SOB   Epigastric / midchest   3/10    Pain would get worse with cavity, with would go away with rest.    Occurred for a few days on and off  OK at [place] to [facility] CD on 1/20   trop negative    Patient found to be anemic   Hgb 7.8   Given 2 U PRBC.   Felt beter since transfusiion felt good  NO CP even with walking   EGD today was normal  Plan for colonoscopy tomorrow     Past Medical History:  Diagnosis Date   Abnormal cardiovascular stress test 03/2013   Anxiety    Arthritis    Back pain    CHF (congestive heart failure) (HCC)    Coronary artery disease    Multivessel with significant LAD involvement by chest CT 2014   Depression    Diastolic dysfunction    Essential hypertension    Lumbar herniated disc    L4-L5   MRSA (methicillin resistant staph aureus) culture positive    Neuropathy    Shortness of breath dyspnea    Sleep apnea    No cpap   Type 2 diabetes mellitus ([place])     Past Surgical History:  Procedure Laterality Date   ANTERIOR CERVICAL DECOMP/DISCECTOMY FUSION  01/18/2011    Procedure: ANTERIOR CERVICAL DECOMPRESSION/DISCECTOMY FUSION 2 LEVELS;  Surgeon: Cooper Render Pool;  Location: [place] [facility];  Service: Neurosurgery;  Laterality: N/A;  anterior cervical discectomy and fusion with allograft and plating, cervical five-six, cervical six-seven   BIOPSY N/A 04/04/2015   Procedure: BIOPSY;  Surgeon: Rogene Houston, MD;  [facility];  Service: Endoscopy;  Laterality: N/A;   BIOPSY  11/09/2019   Procedure: BIOPSY;  Surgeon: Harvel Quale, MD;  [facility];  Service: Gastroenterology;;   Bone spur removed  1997   Left shoulder   CARDIAC CATHETERIZATION N/A 08/08/2015   Procedure: Right/Left Heart Cath and Coronary Angiography;  Surgeon: Sherren Mocha, MD;  Location: [place] [facility];  Service: Cardiovascular;  Laterality: N/A;   CARPAL TUNNEL RELEASE Right    COLONOSCOPY WITH PROPOFOL N/A 11/09/2019   Procedure: COLONOSCOPY WITH PROPOFOL;  Surgeon: Harvel Quale, MD;  [facility];  Service: Gastroenterology;  Laterality: N/A;  900   COLONOSCOPY WITH PROPOFOL N/A 02/27/2021   Procedure: COLONOSCOPY WITH PROPOFOL;  Surgeon: Jenetta Downer  Roney Marion, MD;  [facility];  Service: Gastroenterology;  Laterality: N/A;  7:30   ESOPHAGEAL DILATION N/A 04/04/2015   Procedure: ESOPHAGEAL DILATION;  Surgeon: Rogene Houston, MD;  [facility];  Service: Endoscopy;  Laterality: N/A;   ESOPHAGEAL DILATION N/A 11/09/2019   Procedure: ESOPHAGEAL DILATION;  Surgeon: Harvel Quale, MD;  [facility];  Service: Gastroenterology;  Laterality: N/A;   ESOPHAGOGASTRODUODENOSCOPY N/A 04/04/2015   Procedure: ESOPHAGOGASTRODUODENOSCOPY (EGD);  Surgeon: Rogene Houston, MD;  [facility];  Service: Endoscopy;  Laterality: N/A;  1240   ESOPHAGOGASTRODUODENOSCOPY (EGD) WITH PROPOFOL N/A 11/09/2019   Procedure: ESOPHAGOGASTRODUODENOSCOPY (EGD) WITH PROPOFOL;  Surgeon: Harvel Quale, MD;  [facility];  Service: Gastroenterology;  Laterality: N/A;   HEMORRHOID SURGERY     POLYPECTOMY  11/09/2019   Procedure: POLYPECTOMY;  Surgeon: Harvel Quale, MD;  [facility];  Service: Gastroenterology;;   POLYPECTOMY  02/27/2021   Procedure: POLYPECTOMY;  Surgeon: Harvel Quale, MD;  [facility];  Service: Gastroenterology;;   TONSILLECTOMY       Home Medications:  Prior to Admission medications   Medication Sig Start Date End Date Taking? Authorizing Provider  aspirin EC 81 MG tablet Take 81 mg by mouth daily.   Yes [provider]  atorvastatin (LIPITOR) 80 MG tablet Take 1 tablet (80 mg total) by mouth daily. 10/22/21  Yes Wendie Agreste, MD  Calcium-Magnesium-Zinc (CAL-MAG-ZINC PO) Take 1 tablet by mouth daily.   Yes [provider]  Flaxseed, Linseed, (FLAXSEED OIL) 1000 MG CAPS Take 1,000 mg by mouth daily.   Yes [provider]  furosemide (LASIX) 40 MG tablet TAKE 1 TABLET BY MOUTH DAILY Patient taking differently: Take 40 mg by mouth daily as needed for fluid. 01/07/22  Yes BranchAlphonse Guild, MD  gabapentin (NEURONTIN) 300 MG capsule Take 1-2 capsules (300-600 mg total) by mouth 2 (two) times daily. Patient taking differently: Take 900-1,200 mg by mouth at bedtime. 10/22/21  Yes Wendie Agreste, MD  lisinopril (ZESTRIL) 20 MG tablet Take 1 tablet (20 mg total) by mouth daily. 02/23/21 10/16/78 Yes Wendie Agreste, MD  magnesium oxide (MAG-OX) 400 MG tablet Take 400 mg by mouth at bedtime.   Yes [provider]  metFORMIN (GLUCOPHAGE) 500 MG tablet TAKE 2 TABLETS BY MOUTH TWICE  DAILY 10/12/21  Yes Wendie Agreste, MD  metoprolol succinate (TOPROL-XL) 25 MG 24 hr tablet TAKE 1 TABLET BY MOUTH DAILY AT  BEDTIME 01/07/22  Yes Branch, Alphonse Guild, MD  Multiple Vitamin (MULTIVITAMIN WITH MINERALS) TABS Take 1 tablet by mouth daily.   Yes [provider]   nitroGLYCERIN (NITROSTAT) 0.4 MG SL tablet Place 1 tablet (0.4 mg total) under the tongue every 5 (five) minutes x 3 doses as needed for chest pain (if no relief after 2nd dose, proceed to ED for an evaluation). 10/15/21  Yes BranchAlphonse Guild, MD  omeprazole (PRILOSEC) 40 MG capsule Take 1 capsule (40 mg total) by mouth daily. 03/17/21  Yes Harvel Quale, MD  Potassium 99 MG TABS Take 1 tablet by mouth every evening.   Yes [provider]  ACCU-CHEK AVIVA PLUS test strip USE AS INSTRUCTED 06/13/19   Maryruth Hancock, MD  chlorhexidine (HIBICLENS) 4 % external liquid Apply topically daily as needed. Patient not taking: Reported on 12/17/2021 08/14/21   Volney American, PA-C  mupirocin ointment (BACTROBAN) 2 % Apply 1  application. topically 2 (two) times daily. Patient not taking: Reported on 12/17/2021 08/14/21   Volney American, PA-C    Inpatient Medications: Scheduled Meds:  atorvastatin  80 mg Oral Daily   gabapentin  300-600 mg Oral BID   insulin aspart  0-5 Units Subcutaneous QHS   insulin aspart  0-9 Units Subcutaneous TID WC   metoprolol succinate  25 mg Oral QHS   nicotine  21 mg Transdermal Daily   Continuous Infusions:  pantoprazole Stopped (04/04/22 1312)   PRN Meds: acetaminophen **OR** acetaminophen, albuterol, furosemide, furosemide, melatonin, nitroGLYCERIN  Allergies:    Allergies  Allergen Reactions   Morphine And Related Nausea And Vomiting    Social History:   Social History   Socioeconomic History   Marital status: Married    Spouse name: Not on file   Number of children: Not on file   Years of education: Not on file   Highest education level: Not on file  Occupational History   Not on file  Tobacco Use   Smoking status: Every Day    Packs/day: 1.00    Years: 50.00    Total pack years: 50.00    Types: Cigarettes    Start date: 11/26/1967   Smokeless tobacco: Former   Tobacco comments:    smoking x 50 yrs  Vaping Use    Vaping Use: Never used  Substance and Sexual Activity   Alcohol use: Yes    Alcohol/week: 1.0 standard drink of alcohol    Types: 1 Standard drinks or equivalent per week    Comment: rare   Drug use: Yes    Frequency: 1.0 times per week    Types: Marijuana    Comment: as needed   Sexual activity: Yes  Other Topics Concern   Not on file  Social History Narrative   Lives with wife   Right Handed   Drinks 2-3 cups caffeine daily   Social Determinants of Health   Financial Resource Strain: Low Risk  (01/20/2022)   Overall Financial Resource Strain (CARDIA)    Difficulty of Paying Living Expenses: Not hard at all  Food Insecurity: No Food Insecurity (04/03/2022)   Hunger Vital Sign    Worried About Running Out of Food in the Last Year: Never true    Ran Out of Food in the Last Year: Never true  Transportation Needs: No Transportation Needs (04/03/2022)   PRAPARE - Hydrologist (Medical): No    Lack of Transportation (Non-Medical): No  Physical Activity: Inactive (01/20/2022)   Exercise Vital Sign    Days of Exercise per Week: 0 days    Minutes of Exercise per Session: 0 min  Stress: No Stress Concern Present (01/20/2022)   [place]    Feeling of Stress : Not at all  Social Connections: Moderately Integrated (01/20/2022)   Social Connection and Isolation Panel [NHANES]    Frequency of Communication with Friends and Family: More than three times a week    Frequency of Social Gatherings with Friends and Family: More than three times a week    Attends Religious Services: Never    Marine scientist or Organizations: No    Attends Music therapist: More than 4 times per year    Marital Status: Married  Human resources officer Violence: Not At Risk (04/03/2022)   Humiliation, Afraid, Rape, and Kick questionnaire    Fear of Current or Ex-Partner: No  Emotionally Abused: No     Physically Abused: No    Sexually Abused: No    Family History:    Family History  Problem Relation Age of Onset   Lung cancer Mother    Diabetes Father    Hepatitis B Father    Diabetes Sister    Cushing syndrome Sister      ROS:  Please see the history of present illness.   All other ROS reviewed and negative.     Physical Exam/Data:   Vitals:   04/04/22 1216 04/04/22 1315 04/04/22 1325 04/04/22 1335  BP: (!) 151/62 121/79 120/61 (!) 138/57  Pulse: 70 69 68 66  Resp: 18 18 (!) 22 15  Temp: 98.4 F (36.9 C) 98.1 F (36.7 C)    TempSrc: Temporal Temporal    SpO2: 97% 99% 96% 97%  Weight:      Height:        Intake/Output Summary (Last 24 hours) at 04/04/2022 1536 Last data filed at 04/04/2022 1313 Gross per 24 hour  Intake 400 ml  Output 2425 ml  Net -2025 ml      04/04/2022   12:40 AM 04/02/2022    7:17 PM 02/03/2022   10:43 AM  Last 3 Weights  Weight (lbs) 186 lb 1.6 oz 185 lb 190 lb 3.2 oz  Weight (kg) 84.414 kg 83.915 kg 86.274 kg     Body mass index is 29.15 kg/m.  General:  Well nourished, well developed, in no acute distress HEENT: normal Neck: no JVD Vascular: No carotid bruits; Distal pulses 2+ bilaterally Cardiac:  normal S1, S2; RRR grade 2/6 systolic murmur at the base. Lungs:  clear to auscultation bilaterally, no wheezing, rhonchi or rales  Abd: soft, nontender, no hepatomegaly  Ext: no edema Musculoskeletal:  No deformities, BUE and BLE strength normal and equal Skin: warm and dry  Neuro:  CNs 2-12 intact, no focal abnormalities noted Psych:  Normal affect   EKG:  The EKG was personally reviewed and demonstrates:  NSR   LVH with repolarization abnormality.   Telemetry:  Telemetry was personally reviewed and demonstrates: Sinus rhythm  Relevant CV Studies:  Echo  04/03/22      ECHOCARDIOGRAM REPORT       Patient Name:   Jonathan Gilmore Date of Exam: 04/03/2022 Medical Rec #:  [id number]      Height:       67.0 in Accession #:     [id number]     Weight:       185.0 lb Date of Birth:  [birth year]      BSA:          1.956 m Patient Age:    33 years       BP:           128/53 mmHg Patient Gender: M              HR:           73 bpm. Exam Location:  [facility]  Procedure: 2D Echo, Cardiac Doppler and Color Doppler  Indications:    Chest Pain R07.9   History:        Patient has prior history of Echocardiogram examinations, most                 recent 09/10/2020. CHF, CAD, Aortic Valve Disease,                 Signs/Symptoms:Syncope; Risk Factors:Hypertension, Diabetes,  Dyslipidemia and Current Smoker.   Sonographer:    Alvino Chapel RCS Referring Phys: [id number] [place] Comments: Patient had a difficult time lying on left side. IMPRESSIONS    1. Left ventricular ejection fraction, by estimation, is 55 to 60%. The left ventricle has normal function. The left ventricle demonstrates regional wall motion abnormalities (see scoring diagram/findings for description). There is moderate left ventricular hypertrophy. Left ventricular diastolic parameters are consistent with Grade I diastolic dysfunction (impaired relaxation).  2. Right ventricular systolic function is normal. The right ventricular size is normal.  3. Left atrial size was severely dilated.  4. Right atrial size was mildly dilated.  5. The mitral valve is normal in structure. Mild mitral valve regurgitation. No evidence of mitral stenosis.  6. Prior AV mean gradient 17 mmHg. The aortic valve is calcified. There is moderate calcification of the aortic valve. There is moderate thickening of the aortic valve. Aortic valve regurgitation is not visualized. Moderate aortic valve stenosis. Aortic  valve mean gradient measures 23.0 mmHg. Aortic valve Vmax measures 3.08 m/s.  7. The inferior vena cava is dilated in size with <50% respiratory variability, suggesting right atrial pressure of 15 mmHg.  8. Evidence of atrial  level shunting detected by color flow Doppler. There is a small patent foramen ovale with predominantly left to right shunting across the atrial septum.  Comparison(s): Prior images reviewed side by side.    08/2020 echo   IMPRESSIONS     1. Left ventricular ejection fraction, by estimation, is 60 to 65%. The  left ventricle has normal function. The left ventricle has no regional  wall motion abnormalities. There is moderate left ventricular hypertrophy.  Left ventricular diastolic  parameters are consistent with Grade I diastolic dysfunction (impaired  relaxation). Elevated left atrial pressure. The average left ventricular  global longitudinal strain is -17.6 %.   2. Right ventricular systolic function is normal. The right ventricular  size is normal.   3. The mitral valve is abnormal. Mild mitral valve regurgitation. No  evidence of mitral stenosis.   4. The aortic valve has an indeterminant number of cusps. There is  moderate calcification of the aortic valve. There is moderate thickening  of the aortic valve. Aortic valve regurgitation is mild. Mild to moderate  aortic valve stenosis. Mild to moderate   aortic stenosis is present. Aortic valve mean gradient measures 17.0  mmHg. Aortic valve peak gradient measures 26.7 mmHg. Aortic valve area, by  VTI measures 1.12 cm.    02/2021 nuclear stress  Findings are consistent with prior myocardial infarction. The study is low risk.   1 mm down sloping ST depression in the inferolateral leads was noted with exercise.  Patient did not achieve target HR and was switched to [place].  There was no significant ST deviation during Lexiscan infusion.   Left ventricular function is abnormal. Global function is mildly reduced. Nuclear stress EF: 50 %. The left ventricular ejection fraction is mildly decreased (45-54%). End diastolic cavity size is mildly enlarged.   Abnormal myovue with motion and diaphragmatic attenuation Possible small  inferior wall infarct from apex to base No ischemia EF 50% with inferior and apical hypokinesis     07/2015 Cath Prox LAD to Mid LAD lesion, 50% stenosed.   1. Calcified moderate proximal-mid LAD stenosis 2. Widely patent, dominant LCx 3. Preserved cardiac output, mildly elevated right-sided intracardiac pressures   Recommend: Medical therapy for nonobstructive CAD and CHF   01/2016 AAA  [place] Screen No aneurysm  Laboratory Data:  High Sensitivity Troponin:   Recent Labs  Lab 04/02/22 2001 04/02/22 2136 04/04/22 0642  TROPONINIHS '11 12 16     '$ Chemistry Recent Labs  Lab 04/02/22 2001 04/03/22 0447 04/04/22 0642  NA 133* 138 140  K 3.8 3.7 3.6  CL 97* 102 105  CO2 '27 25 22  '$ GLUCOSE 305* 114* 115*  BUN 16 14 7*  CREATININE 0.92 0.70 0.67  CALCIUM 8.3* 8.3* 9.0  MG  --   --  2.0  GFRNONAA >60 >60 >60  ANIONGAP '9 11 13    '$ Recent Labs  Lab 04/02/22 2136  PROT 6.4*  ALBUMIN 3.4*  AST 22  ALT 16  ALKPHOS 85  BILITOT 0.5   Lipids  Recent Labs  Lab 04/03/22 0447  CHOL 98  TRIG 52  HDL 34*  LDLCALC 54  CHOLHDL 2.9    Hematology Recent Labs  Lab 04/03/22 0447 04/03/22 1645 04/04/22 0642  WBC 11.2* 9.4 8.5  RBC 3.81* 3.95* 3.97*  HGB 9.1* 9.3* 9.4*  HCT 30.3* 30.7* 30.9*  MCV 79.5* 77.7* 77.8*  MCH 23.9* 23.5* 23.7*  MCHC 30.0 30.3 30.4  RDW 17.1* 17.2* 17.4*  PLT 263 305 276   Thyroid No results for input(s): "TSH", "FREET4" in the last 168 hours.  BNP Recent Labs  Lab 04/03/22 0854 04/04/22 0642  BNP 227.0* 612.9*    DDimer No results for input(s): "DDIMER" in the last 168 hours.   Radiology/Studies:  DG Chest Port 1 View  Result Date: 04/04/2022 CLINICAL DATA:  Shortness of breath. EXAM: PORTABLE CHEST 1 VIEW COMPARISON:  04/02/2022 FINDINGS: Lungs are adequately inflated with interval worsening opacification right base likely small effusion with associated atelectasis although infection is possible. Left lung is clear. Cardiomediastinal  silhouette and remainder of the exam is unchanged. IMPRESSION: Interval worsening opacification right base likely small effusion with associated atelectasis, although infection is possible. Electronically Signed   By: Marin Olp M.D.   On: 04/04/2022 09:17   ECHOCARDIOGRAM COMPLETE  Result Date: 04/03/2022    ECHOCARDIOGRAM REPORT   Patient Name:   Jonathan Gilmore Date of Exam: 04/03/2022 Medical Rec #:  [id number]      Height:       67.0 in Accession #:    [id number]     Weight:       185.0 lb Date of Birth:  10-19-1950      BSA:          1.956 m Patient Age:    80 years       BP:           128/53 mmHg Patient Gender: M              HR:           73 bpm. Exam Location:  [facility] Procedure: 2D Echo, Cardiac Doppler and Color Doppler Indications:    Chest Pain R07.9  History:        Patient has prior history of Echocardiogram examinations, most                 recent 09/10/2020. CHF, CAD, Aortic Valve Disease,                 Signs/Symptoms:Syncope; Risk Factors:Hypertension, Diabetes,                 Dyslipidemia and Current Smoker.  Sonographer:    Alvino Chapel RCS Referring Phys: [phone] [place]  S [place]  Sonographer Comments: Patient had a difficult time lying on left side. IMPRESSIONS  1. Left ventricular ejection fraction, by estimation, is 55 to 60%. The left ventricle has normal function. The left ventricle demonstrates regional wall motion abnormalities (see scoring diagram/findings for description). There is moderate left ventricular hypertrophy. Left ventricular diastolic parameters are consistent with Grade I diastolic dysfunction (impaired relaxation).  2. Right ventricular systolic function is normal. The right ventricular size is normal.  3. Left atrial size was severely dilated.  4. Right atrial size was mildly dilated.  5. The mitral valve is normal in structure. Mild mitral valve regurgitation. No evidence of mitral stenosis.  6. Prior AV mean gradient 17 mmHg. The aortic valve is calcified.  There is moderate calcification of the aortic valve. There is moderate thickening of the aortic valve. Aortic valve regurgitation is not visualized. Moderate aortic valve stenosis. Aortic  valve mean gradient measures 23.0 mmHg. Aortic valve Vmax measures 3.08 m/s.  7. The inferior vena cava is dilated in size with <50% respiratory variability, suggesting right atrial pressure of 15 mmHg.  8. Evidence of atrial level shunting detected by color flow Doppler. There is a small patent foramen ovale with predominantly left to right shunting across the atrial septum. Comparison(s): Prior images reviewed side by side. FINDINGS  Left Ventricle: Left ventricular ejection fraction, by estimation, is 55 to 60%. The left ventricle has normal function. The left ventricle demonstrates regional wall motion abnormalities. The left ventricular internal cavity size was normal in size. There is moderate left ventricular hypertrophy. Left ventricular diastolic parameters are consistent with Grade I diastolic dysfunction (impaired relaxation).  LV Wall Scoring: The basal inferior segment is akinetic. Right Ventricle: The right ventricular size is normal. No increase in right ventricular wall thickness. Right ventricular systolic function is normal. Left Atrium: Left atrial size was severely dilated. Right Atrium: Right atrial size was mildly dilated. Pericardium: There is no evidence of pericardial effusion. Mitral Valve: The mitral valve is normal in structure. Mild mitral valve regurgitation. No evidence of mitral valve stenosis. Tricuspid Valve: The tricuspid valve is normal in structure. Tricuspid valve regurgitation is not demonstrated. No evidence of tricuspid stenosis. Aortic Valve: Prior AV mean gradient 17 mmHg. The aortic valve is calcified. There is moderate calcification of the aortic valve. There is moderate thickening of the aortic valve. Aortic valve regurgitation is not visualized. Moderate aortic stenosis is present.  Aortic valve mean gradient measures 23.0 mmHg. Aortic valve peak gradient measures 37.9 mmHg. Aortic valve area, by VTI measures 0.90 cm. Pulmonic Valve: The pulmonic valve was normal in structure. Pulmonic valve regurgitation is trivial. No evidence of pulmonic stenosis. Aorta: The aortic root is normal in size and structure. Venous: The inferior vena cava is dilated in size with less than 50% respiratory variability, suggesting right atrial pressure of 15 mmHg. IAS/Shunts: Evidence of atrial level shunting detected by color flow Doppler. A small patent foramen ovale is detected with predominantly left to right shunting across the atrial septum.  LEFT VENTRICLE PLAX 2D LVIDd:         4.90 cm   Diastology LVIDs:         3.40 cm   LV e' medial:    7.51 cm/s LV PW:         1.40 cm   LV E/e' medial:  18.5 LV IVS:        1.30 cm   LV e' lateral:   6.53 cm/s LVOT diam:  2.10 cm   LV E/e' lateral: 21.3 LV SV:         73 LV SV Index:   38 LVOT Area:     3.46 cm  RIGHT VENTRICLE RV S prime:     17.50 cm/s TAPSE (M-mode): 2.8 cm LEFT ATRIUM              Index        RIGHT ATRIUM           Index LA diam:        4.40 cm  2.25 cm/m   RA Area:     23.10 cm LA Vol (A2C):   93.9 ml  48.00 ml/m  RA Volume:   72.80 ml  37.21 ml/m LA Vol (A4C):   138.0 ml 70.54 ml/m LA Biplane Vol: 121.0 ml 61.85 ml/m  AORTIC VALVE AV Area (Vmax):    0.95 cm AV Area (Vmean):   0.86 cm AV Area (VTI):     0.90 cm AV Vmax:           308.00 cm/s AV Vmean:          224.000 cm/s AV VTI:            0.814 m AV Peak Grad:      37.9 mmHg AV Mean Grad:      23.0 mmHg LVOT Vmax:         84.20 cm/s LVOT Vmean:        55.600 cm/s LVOT VTI:          0.212 m LVOT/AV VTI ratio: 0.26  AORTA Ao Root diam: 3.20 cm MITRAL VALVE MV Area (PHT): 3.65 cm     SHUNTS MV Decel Time: 208 msec     Systemic VTI:  0.21 m MV E velocity: 139.00 cm/s  Systemic Diam: 2.10 cm MV A velocity: 58.70 cm/s MV E/A ratio:  2.37 Candee Furbish MD Electronically signed by Candee Furbish  MD Signature Date/Time: 04/03/2022/1:42:52 PM    Final    DG Chest 2 View  Result Date: 04/02/2022 CLINICAL DATA:  Chest pain. EXAM: CHEST - 2 VIEW COMPARISON:  12/22/2020. FINDINGS: The heart size and mediastinal contours are within normal limits. There is atherosclerotic calcification of the aorta. The pulmonary vasculature is distended. No consolidation, effusion, or pneumothorax. Cervical spinal fusion hardware is noted. No acute osseous abnormality. IMPRESSION: Pulmonary vascular congestion. Electronically Signed   By: Brett Fairy M.D.   On: 04/02/2022 20:02     Assessment and Plan:   Chest pain.  Patient has about a 3-day history of chest discomfort with exertion.  Presented to the emergency room.  Ruled out for myocardial infarction.  Found to be severely anemic.  Other findings significant for aortic stenosis which is moderate.  With transfusion of blood he is no longer having discomfort.  Most likely the symptoms of chest pressure represents some demand ischemia in the setting of severe anemia and aortic stenosis.  I would follow clinically.  Try to keep hemoglobin within normal range.  No plans for colonoscopy.  Overall I think that should be low risk since he is currently asymptomatic with walking and okay to proceed. 2.  CAD.  Moderate by catheterization 2017 (50% LAD lesion) nuclear stress test in 2022 without ischemia.  Follow-up  3.  Hypertension.  Blood pressure has been a little labile but overall fairly well-controlled.  4..  Aortic stenosis.  Will need to continue to follow-up as an outpatient with periodic echoes  5.  Hyperlipidemia.  Continue on Lipitor.   No other cardiac interventions planned.  Will sign off  WIll make sure he has follow up in clinic Please call with questions    For questions or updates, please contact [place] Please consult [email or web address] for contact info under    Signed, Dorris Carnes, MD  04/04/2022 3:36 PM

## 2022-04-04 NOTE — Progress Notes (Signed)
PT Cancellation Note  Patient Details Name: Jonathan Gilmore MRN: [id number] DOB: November 13, 1950   Cancelled Treatment:    Reason Eval/Treat Not Completed: Patient at procedure or test/unavailable. Will plan to follow up later as time permits.   Moishe Spice, PT, DPT Acute Rehabilitation Services  Office: [place] 04/04/2022, 1:30 PM

## 2022-04-04 NOTE — Anesthesia Preprocedure Evaluation (Addendum)
Anesthesia Evaluation  Patient identified by MRN, date of birth, ID band Patient awake    Reviewed: Allergy & Precautions, NPO status , Patient's Chart, lab work & pertinent test results  History of Anesthesia Complications Negative for: history of anesthetic complications  Airway Mallampati: II  TM Distance: >3 FB Neck ROM: Limited    Dental  (+) Dental Advisory Given, Teeth Intact   Pulmonary sleep apnea , Current Smoker and Patient abstained from smoking.   Pulmonary exam normal        Cardiovascular hypertension, Pt. on medications and Pt. on home beta blockers + CAD and +CHF  Normal cardiovascular exam+ Valvular Problems/Murmurs AS    '24 TTE - 55 to 60%. There is moderate left ventricular hypertrophy. Grade I diastolic dysfunction (impaired relaxation). Left atrial size was severely dilated. Right atrial size was mildly dilated. Mild mitral valve regurgitation. Moderate aortic valve stenosis. Aortic  valve mean gradient measures 23.0 mmHg. Evidence of atrial level shunting detected by color flow Doppler. There is a small patent foramen ovale with predominantly left to right shunting across the atrial septum.     Neuro/Psych  PSYCHIATRIC DISORDERS Anxiety Depression    negative neurological ROS     GI/Hepatic ,GERD  Medicated and Controlled,,(+)     substance abuse  marijuana use  Endo/Other  diabetes, Type 2, Oral Hypoglycemic Agents    Renal/GU negative Renal ROS     Musculoskeletal  (+) Arthritis ,    Abdominal   Peds  Hematology  (+) Blood dyscrasia, anemia   Anesthesia Other Findings   Reproductive/Obstetrics                             Anesthesia Physical Anesthesia Plan  ASA: 3  Anesthesia Plan: MAC   Post-op Pain Management:    Induction:   PONV Risk Score and Plan: 0 and Propofol infusion and Treatment may vary due to age or medical condition  Airway Management  Planned: Nasal Cannula and Natural Airway  Additional Equipment: None  Intra-op Plan:   Post-operative Plan:   Informed Consent: I have reviewed the patients History and Physical, chart, labs and discussed the procedure including the risks, benefits and alternatives for the proposed anesthesia with the patient or authorized representative who has indicated his/her understanding and acceptance.       Plan Discussed with: CRNA and Anesthesiologist  Anesthesia Plan Comments:         Anesthesia Quick Evaluation

## 2022-04-04 NOTE — Evaluation (Signed)
Physical Therapy Evaluation & Discharge Patient Details Name: Jonathan Gilmore MRN: [id number] DOB: [birth year] Today's Date: 04/04/2022  History of Present Illness  Pt is a 72 y.o. male who presented 04/02/22 with chest pain. Pt with hemoccult positive with subacute anemia. EKG had some ST depression. Stable EKG after transfusion on 04/04/2022, troponin negative serially. PMH: chronic diastolic CHF, CAD on aspirin, OSA, HTN, HLD, aortic stenosis, DM2, diabetic neuropathy, prostate cancer   Clinical Impression  Pt presents with condition above. He lives with a roommate, who works from home, in a 1-level house with 3-4 STE. Pt is currently functioning at his baseline with no apparent gait abnormalities or balance deficits. Pt is independent with all functional mobility. Encouraged pt to continue to mobilize frequently while here. All education completed and questions answered. PT will sign off. Thank you for this referral.       Recommendations for follow up therapy are one component of a multi-disciplinary discharge planning process, led by the attending physician.  Recommendations may be updated based on patient status, additional functional criteria and insurance authorization.  Follow Up Recommendations No PT follow up      Assistance Recommended at Discharge None  Patient can return home with the following       Equipment Recommendations None recommended by PT  Recommendations for Other Services       Functional Status Assessment Patient has not had a recent decline in their functional status     Precautions / Restrictions Precautions Precautions: None Restrictions Weight Bearing Restrictions: No      Mobility  Bed Mobility               General bed mobility comments: Pt sitting EOB upon arrival.    Transfers Overall transfer level: Independent Equipment used: None               General transfer comment: No LOB, no need for assistance.     Ambulation/Gait Ambulation/Gait assistance: Independent Gait Distance (Feet): 470 Feet Assistive device: None Gait Pattern/deviations: WFL(Within Functional Limits) Gait velocity: WFL Gait velocity interpretation: >4.37 ft/sec, indicative of normal walking speed   General Gait Details: No significant gait deviations noted, appropriate speed, no overt LOB.  Stairs            Wheelchair Mobility    Modified Rankin (Stroke Patients Only)       Balance Overall balance assessment: No apparent balance deficits (not formally assessed)                                           Pertinent Vitals/Pain Pain Assessment Pain Assessment: Faces Faces Pain Scale: No hurt Pain Intervention(s): Monitored during session    Home Living Family/patient expects to be discharged to:: Private residence Living Arrangements: Non-relatives/Friends Available Help at Discharge: Friend(s);Available 24 hours/day (roommate works from home) Type of Home: House Home Access: Stairs to enter Entrance Stairs-Rails: Right (ascending) Entrance Stairs-Number of Steps: 3-4   Home Layout: One level Home Equipment: None      Prior Function Prior Level of Function : Independent/Modified Independent;Driving             Mobility Comments: No AD. ADLs Comments: Likes to work on cars and ride motorcycles.     Hand Dominance        Extremity/Trunk Assessment   Upper Extremity Assessment Upper Extremity Assessment: Overall WFL for tasks assessed  Lower Extremity Assessment Lower Extremity Assessment: Overall WFL for tasks assessed    Cervical / Trunk Assessment Cervical / Trunk Assessment: Normal  Communication   Communication: No difficulties  Cognition Arousal/Alertness: Awake/alert Behavior During Therapy: WFL for tasks assessed/performed Overall Cognitive Status: Within Functional Limits for tasks assessed                                           General Comments General comments (skin integrity, edema, etc.): VSS on RA (declined to monitor when ambulating though, but denied any lightheadedness/dizziness throughout)    Exercises     Assessment/Plan    PT Assessment Patient does not need any further PT services  PT Problem List         PT Treatment Interventions      PT Goals (Current goals can be found in the Care Plan section)  Acute Rehab PT Goals Patient Stated Goal: to go home PT Goal Formulation: All assessment and education complete, DC therapy Time For Goal Achievement: 04/05/22 Potential to Achieve Goals: Good    Frequency       Co-evaluation               AM-PAC PT "6 Clicks" Mobility  Outcome Measure Help needed turning from your back to your side while in a flat bed without using bedrails?: None Help needed moving from lying on your back to sitting on the side of a flat bed without using bedrails?: None Help needed moving to and from a bed to a chair (including a wheelchair)?: None Help needed standing up from a chair using your arms (e.g., wheelchair or bedside chair)?: None Help needed to walk in hospital room?: None Help needed climbing 3-5 steps with a railing? : None 6 Click Score: 24    End of Session   Activity Tolerance: Patient tolerated treatment well Patient left: in bed;with call bell/phone within reach Nurse Communication: Mobility status PT Visit Diagnosis: Pain Pain - part of body:  (chest at admission)    Time: 4196-2229 PT Time Calculation (min) (ACUTE ONLY): 14 min   Charges:   PT Evaluation $PT Eval Low Complexity: 1 Low          Moishe Spice, PT, DPT Acute Rehabilitation Services  Office: [phone]   Orvan Falconer 04/04/2022, 4:36 PM

## 2022-04-05 ENCOUNTER — Encounter (HOSPITAL_COMMUNITY): Payer: Self-pay | Admitting: Internal Medicine

## 2022-04-05 ENCOUNTER — Inpatient Hospital Stay (HOSPITAL_COMMUNITY): Payer: Medicare Other | Admitting: Anesthesiology

## 2022-04-05 ENCOUNTER — Other Ambulatory Visit (HOSPITAL_COMMUNITY): Payer: Self-pay

## 2022-04-05 ENCOUNTER — Encounter (HOSPITAL_COMMUNITY)
Admission: EM | Disposition: A | Discharge status: Home / Self Care | Payer: Self-pay | Source: Home / Self Care | Attending: Internal Medicine

## 2022-04-05 DIAGNOSIS — K635 Polyp of colon: Secondary | ICD-10-CM

## 2022-04-05 DIAGNOSIS — I110 Hypertensive heart disease with heart failure: Secondary | ICD-10-CM

## 2022-04-05 DIAGNOSIS — D62 Acute posthemorrhagic anemia: Secondary | ICD-10-CM | POA: Diagnosis not present

## 2022-04-05 DIAGNOSIS — I2510 Atherosclerotic heart disease of native coronary artery without angina pectoris: Secondary | ICD-10-CM

## 2022-04-05 DIAGNOSIS — I509 Heart failure, unspecified: Secondary | ICD-10-CM

## 2022-04-05 DIAGNOSIS — K922 Gastrointestinal hemorrhage, unspecified: Secondary | ICD-10-CM

## 2022-04-05 DIAGNOSIS — F17210 Nicotine dependence, cigarettes, uncomplicated: Secondary | ICD-10-CM

## 2022-04-05 DIAGNOSIS — R195 Other fecal abnormalities: Secondary | ICD-10-CM

## 2022-04-05 DIAGNOSIS — Z1211 Encounter for screening for malignant neoplasm of colon: Secondary | ICD-10-CM

## 2022-04-05 DIAGNOSIS — K5790 Diverticulosis of intestine, part unspecified, without perforation or abscess without bleeding: Secondary | ICD-10-CM

## 2022-04-05 HISTORY — PX: POLYPECTOMY: SHX5525

## 2022-04-05 HISTORY — PX: COLONOSCOPY WITH PROPOFOL: SHX5780

## 2022-04-05 LAB — CBC WITH DIFFERENTIAL/PLATELET
Abs Immature Granulocytes: 0.04 10*3/uL (ref 0.00–0.07)
Basophils Absolute: 0.1 10*3/uL (ref 0.0–0.1)
Basophils Relative: 1 %
Eosinophils Absolute: 0.3 10*3/uL (ref 0.0–0.5)
Eosinophils Relative: 3 %
HCT: 32 % — ABNORMAL LOW (ref 39.0–52.0)
Hemoglobin: 9.9 g/dL — ABNORMAL LOW (ref 13.0–17.0)
Immature Granulocytes: 0 %
Lymphocytes Relative: 22 %
Lymphs Abs: 2 10*3/uL (ref 0.7–4.0)
MCH: 24.1 pg — ABNORMAL LOW (ref 26.0–34.0)
MCHC: 30.9 g/dL (ref 30.0–36.0)
MCV: 78 fL — ABNORMAL LOW (ref 80.0–100.0)
Monocytes Absolute: 0.8 10*3/uL (ref 0.1–1.0)
Monocytes Relative: 9 %
Neutro Abs: 5.9 10*3/uL (ref 1.7–7.7)
Neutrophils Relative %: 65 %
Platelets: 227 10*3/uL (ref 150–400)
RBC: 4.1 MIL/uL — ABNORMAL LOW (ref 4.22–5.81)
RDW: 17.8 % — ABNORMAL HIGH (ref 11.5–15.5)
WBC: 9.1 10*3/uL (ref 4.0–10.5)
nRBC: 0 % (ref 0.0–0.2)

## 2022-04-05 LAB — BASIC METABOLIC PANEL
Anion gap: 9 (ref 5–15)
BUN: 9 mg/dL (ref 8–23)
CO2: 22 mmol/L (ref 22–32)
Calcium: 8.9 mg/dL (ref 8.9–10.3)
Chloride: 110 mmol/L (ref 98–111)
Creatinine, Ser: 0.72 mg/dL (ref 0.61–1.24)
GFR, Estimated: >60 mL/min (ref >60)
Glucose, Bld: 104 mg/dL — ABNORMAL HIGH (ref 70–99)
Potassium: 3.4 mmol/L — ABNORMAL LOW (ref 3.5–5.1)
Sodium: 141 mmol/L (ref 135–145)

## 2022-04-05 LAB — GLUCOSE, CAPILLARY
Glucose-Capillary: 91 mg/dL (ref 70–99)
Glucose-Capillary: 91 mg/dL (ref 70–99)

## 2022-04-05 LAB — BRAIN NATRIURETIC PEPTIDE: B Natriuretic Peptide: 401.5 pg/mL — ABNORMAL HIGH (ref 0.0–100.0)

## 2022-04-05 LAB — MAGNESIUM: Magnesium: 2.1 mg/dL (ref 1.7–2.4)

## 2022-04-05 SURGERY — COLONOSCOPY WITH PROPOFOL
Anesthesia: Monitor Anesthesia Care

## 2022-04-05 MED ORDER — POTASSIUM CHLORIDE CRYS ER 20 MEQ PO TBCR
40.0000 meq | EXTENDED_RELEASE_TABLET | Freq: Once | ORAL | Status: AC
  Administered 2022-04-05: 40 meq via ORAL
  Filled 2022-04-05: qty 2

## 2022-04-05 MED ORDER — FERROUS SULFATE 325 (65 FE) MG PO TABS
325.0000 mg | ORAL_TABLET | Freq: Every day | ORAL | 0 refills | Status: DC
  Filled 2022-04-05: qty 30, 30d supply, fill #0

## 2022-04-05 MED ORDER — PANTOPRAZOLE SODIUM 40 MG PO TBEC
40.0000 mg | DELAYED_RELEASE_TABLET | Freq: Every day | ORAL | 2 refills | Status: DC
  Filled 2022-04-05: qty 30, 30d supply, fill #0

## 2022-04-05 MED ORDER — SODIUM CHLORIDE 0.9 % IV SOLN: INTRAVENOUS | Status: DC

## 2022-04-05 MED ORDER — ASPIRIN EC 81 MG PO TBEC: 81.0000 mg | DELAYED_RELEASE_TABLET | Freq: Every day | ORAL | 11 refills | Status: DC

## 2022-04-05 MED ORDER — LACTATED RINGERS IV SOLN: INTRAVENOUS | Status: DC

## 2022-04-05 MED ORDER — FOLIC ACID 1 MG PO TABS
1.0000 mg | ORAL_TABLET | Freq: Every day | ORAL | 0 refills | Status: DC
  Filled 2022-04-05: qty 30, 30d supply, fill #0

## 2022-04-05 MED ORDER — PROPOFOL 500 MG/50ML IV EMUL
INTRAVENOUS | Status: DC | PRN
  Administered 2022-04-05: 100 ug/kg/min via INTRAVENOUS

## 2022-04-05 SURGICAL SUPPLY — 22 items

## 2022-04-05 NOTE — Transfer of Care (Signed)
Immediate Anesthesia Transfer of Care Note  Patient: Jonathan Gilmore  Procedure(s) Performed: COLONOSCOPY WITH PROPOFOL POLYPECTOMY  Patient Location: Endoscopy Unit  Anesthesia Type:MAC  Level of Consciousness: awake, alert , and oriented  Airway & Oxygen Therapy: Patient Spontanous Breathing  Post-op Assessment: Report given to RN and Post -op Vital signs reviewed and stable  Post vital signs: Reviewed and stable  Last Vitals:  Vitals Value Taken Time  BP    Temp    Pulse    Resp    SpO2      Last Pain:  Vitals:   04/05/22 0837  TempSrc: Temporal  PainSc: 0-No pain         Complications: No notable events documented.

## 2022-04-05 NOTE — Anesthesia Procedure Notes (Signed)
Procedure Name: MAC Date/Time: 04/05/2022 9:31 AM  Performed by: Griffin Dakin, CRNAPre-anesthesia Checklist: Patient identified, Emergency Drugs available, Suction available, Patient being monitored and Timeout performed Patient Re-evaluated:Patient Re-evaluated prior to induction Oxygen Delivery Method: Nasal cannula Preoxygenation: Pre-oxygenation with 100% oxygen Induction Type: IV induction Placement Confirmation: positive ETCO2 and breath sounds checked- equal and bilateral Dental Injury: Teeth and Oropharynx as per pre-operative assessment  Comments: optiflow

## 2022-04-05 NOTE — Discharge Instructions (Signed)
Follow with Primary MD Wendie Agreste, MD in 7 days also follow-up with your gastroenterologist within a week, follow-up on polyp biopsy results.  Also schedule small bowel capsule endoscopy.  Follow-up with your cardiologist in 1 to 2 weeks.  Get CBC, CMP, anemia panel, B12, TSH, magnesium-  checked next visit with your primary MD    Activity: As tolerated with Full fall precautions use walker/cane & assistance as needed  Disposition Home   Diet: Heart Healthy    Special Instructions: If you have smoked or chewed Tobacco  in the last 2 yrs please stop smoking, stop any regular Alcohol  and or any Recreational drug use.  On your next visit with your primary care physician please Get Medicines reviewed and adjusted.  Please request your Prim.MD to go over all Hospital Tests and Procedure/Radiological results at the follow up, please get all Hospital records sent to your Prim MD by signing hospital release before you go home.  If you experience worsening of your admission symptoms, develop shortness of breath, life threatening emergency, suicidal or homicidal thoughts you must seek medical attention immediately by calling 911 or calling your MD immediately  if symptoms less severe.  You Must read complete instructions/literature along with all the possible adverse reactions/side effects for all the Medicines you take and that have been prescribed to you. Take any new Medicines after you have completely understood and accpet all the possible adverse reactions/side effects.

## 2022-04-05 NOTE — Plan of Care (Signed)
  Problem: Nutrition: Goal: Adequate nutrition will be maintained Outcome: Completed/Met   Problem: Elimination: Goal: Will not experience complications related to bowel motility Outcome: Completed/Met   

## 2022-04-05 NOTE — Progress Notes (Signed)
Patient is discharging home. Vital signs stable at time of discharge as reflected in discharge summary. Discharge instructions given and verbal understanding returned. No questions at this time. Patient to be discharged with TOC medications.

## 2022-04-05 NOTE — H&P (Signed)
[facility] History and Physical   Primary Care Physician:  Wendie Agreste, MD   Reason for Procedure:   Severe iron deficiency, symptomatic anemia  Plan:    colonoscopy     HPI: Jonathan Gilmore is a 72 y.o. male here for colonoscopy for heme positive stool, severe iron deficiency anemia.   EGD for negative any source of upper GI blood loss  The risks and benefits as well as alternatives of endoscopic procedure(s) have been discussed and reviewed. All questions answered. The patient agrees to proceed.    Past Medical History:  Diagnosis Date   Abnormal cardiovascular stress test 03/2013   Anxiety    Arthritis    Back pain    CHF (congestive heart failure) (HCC)    Coronary artery disease    Multivessel with significant LAD involvement by chest CT 2014   Depression    Diastolic dysfunction    Essential hypertension    Lumbar herniated disc    L4-L5   MRSA (methicillin resistant staph aureus) culture positive    Neuropathy    Shortness of breath dyspnea    Sleep apnea    No cpap   Type 2 diabetes mellitus [facility])     Past Surgical History:  Procedure Laterality Date   ANTERIOR CERVICAL DECOMP/DISCECTOMY FUSION  01/18/2011   Procedure: ANTERIOR CERVICAL DECOMPRESSION/DISCECTOMY FUSION 2 LEVELS;  Surgeon: Cooper Render Pool;  Location: [facility];  Service: Neurosurgery;  Laterality: N/A;  anterior cervical discectomy and fusion with allograft and plating, cervical five-six, cervical six-seven   BIOPSY N/A 04/04/2015   Procedure: BIOPSY;  Surgeon: Rogene Houston, MD;  [facility];  Service: Endoscopy;  Laterality: N/A;   BIOPSY  11/09/2019   Procedure: BIOPSY;  Surgeon: Harvel Quale, MD;  [facility];  Service: Gastroenterology;;   Bone spur removed  1997   Left shoulder   CARDIAC CATHETERIZATION N/A 08/08/2015   Procedure: Right/Left Heart Cath and Coronary Angiography;  Surgeon: Sherren Mocha, MD;  Location: [place]  CV LAB;  Service: Cardiovascular;  Laterality: N/A;   CARPAL TUNNEL RELEASE Right    COLONOSCOPY WITH PROPOFOL N/A 11/09/2019   Procedure: COLONOSCOPY WITH PROPOFOL;  Surgeon: Harvel Quale, MD;  [facility];  Service: Gastroenterology;  Laterality: N/A;  900   COLONOSCOPY WITH PROPOFOL N/A 02/27/2021   Procedure: COLONOSCOPY WITH PROPOFOL;  Surgeon: Harvel Quale, MD;  [facility];  Service: Gastroenterology;  Laterality: N/A;  7:30   ESOPHAGEAL DILATION N/A 04/04/2015   Procedure: ESOPHAGEAL DILATION;  Surgeon: Rogene Houston, MD;  [facility];  Service: Endoscopy;  Laterality: N/A;   ESOPHAGEAL DILATION N/A 11/09/2019   Procedure: ESOPHAGEAL DILATION;  Surgeon: Harvel Quale, MD;  [facility];  Service: Gastroenterology;  Laterality: N/A;   ESOPHAGOGASTRODUODENOSCOPY N/A 04/04/2015   Procedure: ESOPHAGOGASTRODUODENOSCOPY (EGD);  Surgeon: Rogene Houston, MD;  [facility];  Service: Endoscopy;  Laterality: N/A;  1240   ESOPHAGOGASTRODUODENOSCOPY (EGD) WITH PROPOFOL N/A 11/09/2019   Procedure: ESOPHAGOGASTRODUODENOSCOPY (EGD) WITH PROPOFOL;  Surgeon: Harvel Quale, MD;  [facility];  Service: Gastroenterology;  Laterality: N/A;   HEMORRHOID SURGERY     POLYPECTOMY  11/09/2019   Procedure: POLYPECTOMY;  Surgeon: Harvel Quale, MD;  [facility];  Service: Gastroenterology;;   POLYPECTOMY  02/27/2021   Procedure: POLYPECTOMY;  Surgeon: Harvel Quale, MD;  [facility];  Service: Gastroenterology;;  TONSILLECTOMY      Prior to Admission medications   Medication Sig Start Date End Date Taking? Authorizing Provider  aspirin EC 81 MG tablet Take 81 mg by mouth daily.   Yes [provider]  atorvastatin (LIPITOR) 80 MG tablet Take 1 tablet (80 mg total) by mouth daily. 10/22/21  Yes Wendie Agreste, MD  Calcium-Magnesium-Zinc  (CAL-MAG-ZINC PO) Take 1 tablet by mouth daily.   Yes [provider]  Flaxseed, Linseed, (FLAXSEED OIL) 1000 MG CAPS Take 1,000 mg by mouth daily.   Yes [provider]  furosemide (LASIX) 40 MG tablet TAKE 1 TABLET BY MOUTH DAILY Patient taking differently: Take 40 mg by mouth daily as needed for fluid. 01/07/22  Yes BranchAlphonse Guild, MD  gabapentin (NEURONTIN) 300 MG capsule Take 1-2 capsules (300-600 mg total) by mouth 2 (two) times daily. Patient taking differently: Take 900-1,200 mg by mouth at bedtime. 10/22/21  Yes Wendie Agreste, MD  lisinopril (ZESTRIL) 20 MG tablet Take 1 tablet (20 mg total) by mouth daily. 02/23/21 10/16/78 Yes Wendie Agreste, MD  magnesium oxide (MAG-OX) 400 MG tablet Take 400 mg by mouth at bedtime.   Yes [provider]  metFORMIN (GLUCOPHAGE) 500 MG tablet TAKE 2 TABLETS BY MOUTH TWICE  DAILY 10/12/21  Yes Wendie Agreste, MD  metoprolol succinate (TOPROL-XL) 25 MG 24 hr tablet TAKE 1 TABLET BY MOUTH DAILY AT  BEDTIME 01/07/22  Yes Branch, Alphonse Guild, MD  Multiple Vitamin (MULTIVITAMIN WITH MINERALS) TABS Take 1 tablet by mouth daily.   Yes [provider]  nitroGLYCERIN (NITROSTAT) 0.4 MG SL tablet Place 1 tablet (0.4 mg total) under the tongue every 5 (five) minutes x 3 doses as needed for chest pain (if no relief after 2nd dose, proceed to ED for an evaluation). 10/15/21  Yes BranchAlphonse Guild, MD  omeprazole (PRILOSEC) 40 MG capsule Take 1 capsule (40 mg total) by mouth daily. 03/17/21  Yes Harvel Quale, MD  Potassium 99 MG TABS Take 1 tablet by mouth every evening.   Yes [provider]  ACCU-CHEK AVIVA PLUS test strip USE AS INSTRUCTED 06/13/19   Maryruth Hancock, MD  chlorhexidine (HIBICLENS) 4 % external liquid Apply topically daily as needed. Patient not taking: Reported on 12/17/2021 08/14/21   Volney American, PA-C  mupirocin ointment (BACTROBAN) 2 % Apply 1 application. topically 2 (two) times  daily. Patient not taking: Reported on 12/17/2021 08/14/21   Volney American, PA-C    Current Facility-Administered Medications  Medication Dose Route Frequency Provider Last Rate Last Admin   0.9 %  sodium chloride infusion   Intravenous Continuous Willia Craze, NP       0.9 %  sodium chloride infusion   Intravenous Continuous Gatha Mayer, MD 20 mL/hr at 04/05/22 0551 New Bag at 04/05/22 0551   [MAR Hold] acetaminophen (TYLENOL) tablet 650 mg  650 mg Oral Q6H PRN Gatha Mayer, MD       Or   [facility] Hold] acetaminophen (TYLENOL) suppository 650 mg  650 mg Rectal Q6H PRN Gatha Mayer, MD       [MAR Hold] albuterol (PROVENTIL) (2.5 MG/3ML) 0.083% nebulizer solution 2.5 mg  2.5 mg Nebulization Q2H PRN Gatha Mayer, MD       [MAR Hold] atorvastatin (LIPITOR) tablet 80 mg  80 mg Oral Daily Gatha Mayer, MD   80 mg at 04/04/22 0910   [MAR Hold] furosemide (LASIX) injection 20 mg  20 mg Intravenous Once PRN Gatha Mayer, MD       [facility] Hold] furosemide (LASIX) injection 20 mg  20 mg Intravenous Once PRN Gatha Mayer, MD       [facility] Hold] gabapentin (NEURONTIN) capsule 300-600 mg  300-600 mg Oral BID Gatha Mayer, MD   600 mg at 04/04/22 2209   [facility] Hold] insulin aspart (novoLOG) injection 0-5 Units  0-5 Units Subcutaneous QHS Gatha Mayer, MD   2 Units at 04/02/22 2245   [MAR Hold] insulin aspart (novoLOG) injection 0-9 Units  0-9 Units Subcutaneous TID WC Gatha Mayer, MD   1 Units at 04/04/22 9417   lactated ringers infusion   Intravenous Continuous Mauri Pole, MD 20 mL/hr at 04/05/22 0842 New Bag at 04/05/22 0842   [MAR Hold] melatonin tablet 3 mg  3 mg Oral QHS PRN Gatha Mayer, MD   3 mg at 04/04/22 2209   [facility] Hold] metoprolol succinate (TOPROL-XL) 24 hr tablet 25 mg  25 mg Oral QHS Gatha Mayer, MD   25 mg at 04/04/22 2209   [facility] Hold] nicotine (NICODERM CQ - dosed in mg/24 hours) patch 21 mg  21 mg Transdermal Daily Gatha Mayer, MD   21 mg  at 04/04/22 0911   [MAR Hold] nitroGLYCERIN (NITROSTAT) SL tablet 0.4 mg  0.4 mg Sublingual Q5 min PRN Gatha Mayer, MD   0.4 mg at 04/03/22 1905   pantoprozole (PROTONIX) 80 mg /NS 100 mL infusion  8 mg/hr Intravenous Continuous Gatha Mayer, MD   Stopped at 04/04/22 1312   [MAR Hold] potassium chloride SA (KLOR-CON M) CR tablet 40 mEq  40 mEq Oral Once Thurnell Lose, MD        Allergies as of 04/02/2022 - Review Complete 04/02/2022  Allergen Reaction Noted   Morphine and related Nausea And Vomiting 01/18/2011    Family History  Problem Relation Age of Onset   Lung cancer Mother    Diabetes Father    Hepatitis B Father    Diabetes Sister    Cushing syndrome Sister     Social History   Socioeconomic History   Marital status: Married    Spouse name: Not on file   Number of children: Not on file   Years of education: Not on file   Highest education level: Not on file  Occupational History   Not on file  Tobacco Use   Smoking status: Every Day    Packs/day: 1.00    Years: 50.00    Total pack years: 50.00    Types: Cigarettes    Start date: 11/26/1967   Smokeless tobacco: Former   Tobacco comments:    smoking x 50 yrs  Vaping Use   Vaping Use: Never used  Substance and Sexual Activity   Alcohol use: Yes    Alcohol/week: 1.0 standard drink of alcohol    Types: 1 Standard drinks or equivalent per week    Comment: rare   Drug use: Yes    Frequency: 1.0 times per week    Types: Marijuana    Comment: as needed   Sexual activity: Yes  Other Topics Concern   Not on file  Social History Narrative   Lives with wife   Right Handed   Drinks 2-3 cups caffeine daily   Social Determinants of Health   Financial Resource Strain: Low Risk  (01/20/2022)   Overall Financial Resource Strain (CARDIA)    Difficulty of  Paying Living Expenses: Not hard at all  Food Insecurity: No Food Insecurity (04/03/2022)   Hunger Vital Sign    Worried About Running Out of Food in the  Last Year: Never true    Ran Out of Food in the Last Year: Never true  Transportation Needs: No Transportation Needs (04/03/2022)   PRAPARE - Hydrologist (Medical): No    Lack of Transportation (Non-Medical): No  Physical Activity: Inactive (01/20/2022)   Exercise Vital Sign    Days of Exercise per Week: 0 days    Minutes of Exercise per Session: 0 min  Stress: No Stress Concern Present (01/20/2022)   [place]    Feeling of Stress : Not at all  Social Connections: Moderately Integrated (01/20/2022)   Social Connection and Isolation Panel [NHANES]    Frequency of Communication with Friends and Family: More than three times a week    Frequency of Social Gatherings with Friends and Family: More than three times a week    Attends Religious Services: Never    Marine scientist or Organizations: No    Attends Music therapist: More than 4 times per year    Marital Status: Married  Human resources officer Violence: Not At Risk (04/03/2022)   Humiliation, Afraid, Rape, and Kick questionnaire    Fear of Current or Ex-Partner: No    Emotionally Abused: No    Physically Abused: No    Sexually Abused: No    Review of Systems:  All other review of systems negative except as mentioned in the HPI.  Physical Exam: Vital signs in last 24 hours: Temp:  [97.5 F (36.4 C)-98.5 F (36.9 C)] 97.5 F (36.4 C) (01/22 0837) Pulse Rate:  [65-72] 65 (01/22 0837) Resp:  [15-22] 18 (01/22 0837) BP: (120-153)/(54-79) 146/60 (01/22 0837) SpO2:  [96 %-99 %] 96 % (01/22 0837) Weight:  [84.6 kg] 84.6 kg (01/22 0514) Last BM Date : 04/03/22 General:   Alert, NAD Lungs:  Clear .   Heart:  Regular rate and rhythm Abdomen:  Soft, nontender and nondistended. Neuro/Psych:  Alert and cooperative. Normal mood and affect. A and O x 3   K. Denzil Magnuson , MD [phone]

## 2022-04-05 NOTE — Care Management Important Message (Signed)
Important Message  Patient Details  Name: Jonathan Gilmore MRN: [id number] Date of Birth: 06-25-1950   Medicare Important Message Given:  Yes     Shelda Altes 04/05/2022, 12:01 PM

## 2022-04-05 NOTE — Discharge Summary (Signed)
Jonathan Gilmore ZOX:[id number] DOB: [birth year] DOA: 04/02/2022  PCP: Wendie Agreste, MD  Admit date: 04/02/2022  Discharge date: 04/05/2022  Admitted From: Home   Disposition:  Home   Recommendations for Outpatient Follow-up:   Follow up with PCP in 1-2 weeks  PCP Please obtain BMP/CBC, 2 view CXR in 1week,  (see Discharge instructions)   PCP Please follow up on the following pending results: Follow anemia panel, needs close outpatient follow-up with GI and cardiology.   Home Health: None   Equipment/Devices: None  Consultations: GI, Cards Discharge Condition: Stable    CODE STATUS: Full    Diet Recommendation: Heart Healthy     Chief Complaint  Patient presents with   Chest Pain     Brief history of present illness from the day of admission and additional interim summary    72 y.o. male, with past medical history of chronic diastolic CHF, CAD on aspirin, OSA, hypertension, hyperlipidemia, aortic stenosis, diabetes mellitus, diabetic neuropathy, prostate cancer under observation by Dr. Alyson Ingles from urology.  Presented to [facility], ER with chief complaints of exertional chest pain in the ER he was found to be Hemoccult positive with subacute anemia hemoglobin dropped to 7.8 from 12.7, 6 months ago.  EKG had some ST depression.  He was transferred from [facility] for cardiology and GI evaluation.                                                                   Hospital Course   Symptomatic anemia.  Anemia likely subacute GI blood loss could be upper.  Causing acute on chronic anemia, baseline hemoglobin 12.6 has dropped to 7.8 over  5 to 6 months, he is s/p 2 units of packed RBC transfusion at any point with resolution of his symptoms.  Was Hemoccult positive was seen by GI underwent EGD  colonoscopy both were unremarkable a polyp was removed during colonoscopy and nonbleeding internal hemorrhoids were noted.  He does have iron deficiency anemia for which she will be placed on oral iron and folic acid and PPI, outpatient continued follow-up with GI.  He has been told to follow-up with his gastroenterologist within a week and follow-up on polyp biopsy results.  Will require outpatient capsule endoscopy for small bowel evaluation.   Exertional angina.  Stable EKG after transfusion on 04/04/2022, troponin negative serially, echo demonstrates preserved EF with normal wall motion.  No acute issue cardiology to opine.  Seen by cardiology here, now outpatient follow-up with primary cardiologist.  Does not wish to undergo any further testing here.   Chronic diastolic CHF.  EF 60%.  Compensated.     Hypertension.  Stable on beta-blocker.   Dyslipidemia.  On statin.   OSA.  Noncompliant with CPAP.  PCP to  monitor.   Ongoing smoking.  Counseled to quit.  Not motivated.    HX of moderate aortic stenosis.  Currently stable on echo.   History of PAD.  No acute issues.  Outpatient follow-up with vascular surgery.     Diabetic peripheral neuropathy.  Continue gabapentin.     History of prostate cancer.  No acute issues follow with Dr. Alyson Ingles, follow-up postdischarge.     DM Type II.  Continue home medications.   Discharge diagnosis     Principal Problem:   Acute blood loss anemia Active Problems:   Chest pain   DM type 2 causing vascular disease (HCC)   Mixed hyperlipidemia   Diastolic CHF (HCC)   Tobacco use   Essential hypertension, benign   Prostate cancer ([place])   Heme + stool   Decreased hemoglobin    Discharge instructions    Discharge Instructions     Ambulatory Referral for Lung Cancer Scre   Complete by: As directed    Diet - low sodium heart healthy   Complete by: As directed    Discharge instructions   Complete by: As directed    Follow with Primary MD  Wendie Agreste, MD in 7 days also follow-up with your gastroenterologist within a week, follow-up on polyp biopsy results.  Also schedule small bowel capsule endoscopy.  Follow-up with your cardiologist in 1 to 2 weeks.  Get CBC, CMP, anemia panel, B12, TSH, magnesium-  checked next visit with your primary MD    Activity: As tolerated with Full fall precautions use walker/cane & assistance as needed  Disposition Home   Diet: Heart Healthy    Special Instructions: If you have smoked or chewed Tobacco  in the last 2 yrs please stop smoking, stop any regular Alcohol  and or any Recreational drug use.  On your next visit with your primary care physician please Get Medicines reviewed and adjusted.  Please request your Prim.MD to go over all Hospital Tests and Procedure/Radiological results at the follow up, please get all Hospital records sent to your Prim MD by signing hospital release before you go home.  If you experience worsening of your admission symptoms, develop shortness of breath, life threatening emergency, suicidal or homicidal thoughts you must seek medical attention immediately by calling 911 or calling your MD immediately  if symptoms less severe.  You Must read complete instructions/literature along with all the possible adverse reactions/side effects for all the Medicines you take and that have been prescribed to you. Take any new Medicines after you have completely understood and accpet all the possible adverse reactions/side effects.   Increase activity slowly   Complete by: As directed        Discharge Medications   Allergies as of 04/05/2022       Reactions   Morphine And Related Nausea And Vomiting        Medication List     STOP taking these medications    chlorhexidine 4 % external liquid Commonly known as: Hibiclens   mupirocin ointment 2 % Commonly known as: BACTROBAN   omeprazole 40 MG capsule Commonly known as: PRILOSEC       TAKE these  medications    Accu-Chek Aviva Plus test strip Generic drug: glucose blood USE AS INSTRUCTED   aspirin EC 81 MG tablet Take 1 tablet (81 mg total) by mouth daily. Start taking on: April 08, 2022 What changed: These instructions start on April 08, 2022. If you are unsure what to do  until then, ask your doctor or other care provider.   atorvastatin 80 MG tablet Commonly known as: LIPITOR Take 1 tablet (80 mg total) by mouth daily.   CAL-MAG-ZINC PO Take 1 tablet by mouth daily.   ferrous sulfate 325 (65 FE) MG tablet Take 1 tablet (325 mg total) by mouth daily with breakfast.   Flaxseed Oil 1000 MG Caps Take 1,000 mg by mouth daily.   folic acid 1 MG tablet Commonly known as: FOLVITE Take 1 tablet (1 mg total) by mouth daily.   furosemide 40 MG tablet Commonly known as: LASIX TAKE 1 TABLET BY MOUTH DAILY What changed:  when to take this reasons to take this   gabapentin 300 MG capsule Commonly known as: NEURONTIN Take 1-2 capsules (300-600 mg total) by mouth 2 (two) times daily. What changed:  how much to take when to take this   lisinopril 20 MG tablet Commonly known as: ZESTRIL Take 1 tablet (20 mg total) by mouth daily.   magnesium oxide 400 MG tablet Commonly known as: MAG-OX Take 400 mg by mouth at bedtime.   metFORMIN 500 MG tablet Commonly known as: GLUCOPHAGE TAKE 2 TABLETS BY MOUTH TWICE  DAILY   metoprolol succinate 25 MG 24 hr tablet Commonly known as: TOPROL-XL TAKE 1 TABLET BY MOUTH DAILY AT  BEDTIME   multivitamin with minerals Tabs tablet Take 1 tablet by mouth daily.   nitroGLYCERIN 0.4 MG SL tablet Commonly known as: NITROSTAT Place 1 tablet (0.4 mg total) under the tongue every 5 (five) minutes x 3 doses as needed for chest pain (if no relief after 2nd dose, proceed to ED for an evaluation).   pantoprazole 40 MG tablet Commonly known as: Protonix Take 1 tablet (40 mg total) by mouth daily.   Potassium 99 MG Tabs Take 1 tablet  by mouth every evening.         Follow-up Information     Wendie Agreste, MD. Schedule an appointment as soon as possible for a visit in 1 week(s).   Specialties: Family Medicine, Sports Medicine Why: Also follow-up with your gastroenterologist in 2 weeks and follow-up on polyp biopsy results, also schedule small bowel capsule endoscopy. Contact information: [address] [place] [address] [phone]         Arnoldo Lenis, MD. Schedule an appointment as soon as possible for a visit in 1 week(s).   Specialty: Cardiology Contact information: [address] [place] [address] [phone]                 Major procedures and Radiology Reports - PLEASE review detailed and final reports thoroughly  -       DG Chest [facility] 1 View  Result Date: 04/04/2022 CLINICAL DATA:  Shortness of breath. EXAM: PORTABLE CHEST 1 VIEW COMPARISON:  04/02/2022 FINDINGS: Lungs are adequately inflated with interval worsening opacification right base likely small effusion with associated atelectasis although infection is possible. Left lung is clear. Cardiomediastinal silhouette and remainder of the exam is unchanged. IMPRESSION: Interval worsening opacification right base likely small effusion with associated atelectasis, although infection is possible. Electronically Signed   By: Marin Olp M.D.   On: 04/04/2022 09:17   ECHOCARDIOGRAM COMPLETE  Result Date: 04/03/2022    ECHOCARDIOGRAM REPORT   Patient Name:   Jonathan Gilmore Date of Exam: 04/03/2022 Medical Rec #:  [id number]      Height:       67.0 in Accession #:  [id number]     Weight:       185.0 lb Date of Birth:  [birth year]      BSA:          1.956 m Patient Age:    87 years       BP:           128/53 mmHg Patient Gender: M              HR:           73 bpm. Exam Location:  [facility] Procedure: 2D Echo, Cardiac Doppler and Color Doppler Indications:    Chest Pain R07.9  History:        Patient has prior history of  Echocardiogram examinations, most                 recent 09/10/2020. CHF, CAD, Aortic Valve Disease,                 Signs/Symptoms:Syncope; Risk Factors:Hypertension, Diabetes,                 Dyslipidemia and Current Smoker.  Sonographer:    Alvino Chapel RCS Referring Phys: [id number] [place] Comments: Patient had a difficult time lying on left side. IMPRESSIONS  1. Left ventricular ejection fraction, by estimation, is 55 to 60%. The left ventricle has normal function. The left ventricle demonstrates regional wall motion abnormalities (see scoring diagram/findings for description). There is moderate left ventricular hypertrophy. Left ventricular diastolic parameters are consistent with Grade I diastolic dysfunction (impaired relaxation).  2. Right ventricular systolic function is normal. The right ventricular size is normal.  3. Left atrial size was severely dilated.  4. Right atrial size was mildly dilated.  5. The mitral valve is normal in structure. Mild mitral valve regurgitation. No evidence of mitral stenosis.  6. Prior AV mean gradient 17 mmHg. The aortic valve is calcified. There is moderate calcification of the aortic valve. There is moderate thickening of the aortic valve. Aortic valve regurgitation is not visualized. Moderate aortic valve stenosis. Aortic  valve mean gradient measures 23.0 mmHg. Aortic valve Vmax measures 3.08 m/s.  7. The inferior vena cava is dilated in size with <50% respiratory variability, suggesting right atrial pressure of 15 mmHg.  8. Evidence of atrial level shunting detected by color flow Doppler. There is a small patent foramen ovale with predominantly left to right shunting across the atrial septum. Comparison(s): Prior images reviewed side by side. FINDINGS  Left Ventricle: Left ventricular ejection fraction, by estimation, is 55 to 60%. The left ventricle has normal function. The left ventricle demonstrates regional wall motion abnormalities. The left  ventricular internal cavity size was normal in size. There is moderate left ventricular hypertrophy. Left ventricular diastolic parameters are consistent with Grade I diastolic dysfunction (impaired relaxation).  LV Wall Scoring: The basal inferior segment is akinetic. Right Ventricle: The right ventricular size is normal. No increase in right ventricular wall thickness. Right ventricular systolic function is normal. Left Atrium: Left atrial size was severely dilated. Right Atrium: Right atrial size was mildly dilated. Pericardium: There is no evidence of pericardial effusion. Mitral Valve: The mitral valve is normal in structure. Mild mitral valve regurgitation. No evidence of mitral valve stenosis. Tricuspid Valve: The tricuspid valve is normal in structure. Tricuspid valve regurgitation is not demonstrated. No evidence of tricuspid stenosis. Aortic Valve: Prior AV mean gradient 17 mmHg. The aortic valve is calcified. There is moderate calcification of the aortic valve.  There is moderate thickening of the aortic valve. Aortic valve regurgitation is not visualized. Moderate aortic stenosis is present. Aortic valve mean gradient measures 23.0 mmHg. Aortic valve peak gradient measures 37.9 mmHg. Aortic valve area, by VTI measures 0.90 cm. Pulmonic Valve: The pulmonic valve was normal in structure. Pulmonic valve regurgitation is trivial. No evidence of pulmonic stenosis. Aorta: The aortic root is normal in size and structure. Venous: The inferior vena cava is dilated in size with less than 50% respiratory variability, suggesting right atrial pressure of 15 mmHg. IAS/Shunts: Evidence of atrial level shunting detected by color flow Doppler. A small patent foramen ovale is detected with predominantly left to right shunting across the atrial septum.  LEFT VENTRICLE PLAX 2D LVIDd:         4.90 cm   Diastology LVIDs:         3.40 cm   LV e' medial:    7.51 cm/s LV PW:         1.40 cm   LV E/e' medial:  18.5 LV IVS:         1.30 cm   LV e' lateral:   6.53 cm/s LVOT diam:     2.10 cm   LV E/e' lateral: 21.3 LV SV:         73 LV SV Index:   38 LVOT Area:     3.46 cm  RIGHT VENTRICLE RV S prime:     17.50 cm/s TAPSE (M-mode): 2.8 cm LEFT ATRIUM              Index        RIGHT ATRIUM           Index LA diam:        4.40 cm  2.25 cm/m   RA Area:     23.10 cm LA Vol (A2C):   93.9 ml  48.00 ml/m  RA Volume:   72.80 ml  37.21 ml/m LA Vol (A4C):   138.0 ml 70.54 ml/m LA Biplane Vol: 121.0 ml 61.85 ml/m  AORTIC VALVE AV Area (Vmax):    0.95 cm AV Area (Vmean):   0.86 cm AV Area (VTI):     0.90 cm AV Vmax:           308.00 cm/s AV Vmean:          224.000 cm/s AV VTI:            0.814 m AV Peak Grad:      37.9 mmHg AV Mean Grad:      23.0 mmHg LVOT Vmax:         84.20 cm/s LVOT Vmean:        55.600 cm/s LVOT VTI:          0.212 m LVOT/AV VTI ratio: 0.26  AORTA Ao Root diam: 3.20 cm MITRAL VALVE MV Area (PHT): 3.65 cm     SHUNTS MV Decel Time: 208 msec     Systemic VTI:  0.21 m MV E velocity: 139.00 cm/s  Systemic Diam: 2.10 cm MV A velocity: 58.70 cm/s MV E/A ratio:  2.37 Candee Furbish MD Electronically signed by Candee Furbish MD Signature Date/Time: 04/03/2022/1:42:52 PM    Final    DG Chest 2 View  Result Date: 04/02/2022 CLINICAL DATA:  Chest pain. EXAM: CHEST - 2 VIEW COMPARISON:  12/22/2020. FINDINGS: The heart size and mediastinal contours are within normal limits. There is atherosclerotic calcification of the aorta. The pulmonary vasculature is distended. No consolidation, effusion,  or pneumothorax. Cervical spinal fusion hardware is noted. No acute osseous abnormality. IMPRESSION: Pulmonary vascular congestion. Electronically Signed   By: Brett Fairy M.D.   On: 04/02/2022 20:02    Micro Results    No results found for this or any previous visit (from the past 240 hour(s)).  Today   Subjective    Jonathan Gilmore today has no headache,no chest abdominal pain,no new weakness tingling or numbness, feels much better wants  to go home today, wanted to leave AMA before colonoscopy but was counseled to stay, does not want to stay for any further workup   Objective   Blood pressure (!) 149/64, pulse 63, temperature (!) 97.1 F (36.2 C), temperature source Temporal, resp. rate 12, height '5\' 7"'$  (1.702 m), weight 84.6 kg, SpO2 98 %.   Intake/Output Summary (Last 24 hours) at 04/05/2022 1029 Last data filed at 04/04/2022 1700 Gross per 24 hour  Intake 300 ml  Output 600 ml  Net -300 ml    Exam  Awake Alert, No new F.N deficits,    [place].AT,PERRAL Supple Neck,   Symmetrical Chest wall movement, Good air movement bilaterally, CTAB RRR,No Gallops,   +ve B.Sounds, Abd Soft, Non tender,  No Cyanosis, Clubbing or edema    Data Review   Recent Labs  Lab 04/02/22 2001 04/03/22 0447 04/03/22 1645 04/04/22 0642 04/05/22 0031  WBC 8.5 11.2* 9.4 8.5 9.1  HGB 7.8* 9.1* 9.3* 9.4* 9.9*  HCT 26.2* 30.3* 30.7* 30.9* 32.0*  PLT 309 263 305 276 227  MCV 77.5* 79.5* 77.7* 77.8* 78.0*  MCH 23.1* 23.9* 23.5* 23.7* 24.1*  MCHC 29.8* 30.0 30.3 30.4 30.9  RDW 16.9* 17.1* 17.2* 17.4* 17.8*  LYMPHSABS  --   --  2.2 2.0 2.0  MONOABS  --   --  0.8 0.7 0.8  EOSABS  --   --  0.3 0.4 0.3  BASOSABS  --   --  0.1 0.1 0.1    Recent Labs  Lab 04/02/22 2001 04/02/22 2136 04/02/22 2238 04/03/22 0447 04/03/22 0854 04/04/22 0642 04/05/22 0031  NA 133*  --   --  138  --  140 141  K 3.8  --   --  3.7  --  3.6 3.4*  CL 97*  --   --  102  --  105 110  CO2 27  --   --  25  --  22 22  ANIONGAP 9  --   --  11  --  13 9  GLUCOSE 305*  --   --  114*  --  115* 104*  BUN 16  --   --  14  --  7* 9  CREATININE 0.92  --   --  0.70  --  0.67 0.72  AST  --  22  --   --   --   --   --   ALT  --  16  --   --   --   --   --   ALKPHOS  --  85  --   --   --   --   --   BILITOT  --  0.5  --   --   --   --   --   ALBUMIN  --  3.4*  --   --   --   --   --   INR  --  1.1  --   --   --   --   --  HGBA1C  --   --  8.3*  --   --   --   --    BNP  --   --   --   --  227.0* 612.9* 401.5*  MG  --   --   --   --   --  2.0 2.1  CALCIUM 8.3*  --   --  8.3*  --  9.0 8.9     Total Time in preparing paper work, data evaluation and todays exam - 35 minutes  Signature  -    Lala Lund M.D on 04/05/2022 at 10:29 AM   -  To page go to [email or web address]

## 2022-04-05 NOTE — Op Note (Signed)
[facility] Patient Name: Jonathan Gilmore Procedure Date : 04/05/2022 MRN: [id number] Attending MD: Mauri Pole , MD, [id number] Date of Birth: 13-Oct-1950 CSN: [id number] Age: 72 Admit Type: Inpatient Procedure:                Colonoscopy Indications:              Evaluation of unexplained GI bleeding presenting                            with fecal occult blood, Unexplained iron                            deficiency anemia Providers:                Mauri Pole, MD, Daine Gravel, RN, Darliss Cheney, Technician, Viann Fish, CRNA Referring MD:              Medicines:                Monitored Anesthesia Care Complications:            No immediate complications. Estimated Blood Loss:     Estimated blood loss was minimal. Procedure:                Pre-Anesthesia Assessment:                           - Prior to the procedure, a History and Physical                            was performed, and patient medications and                            allergies were reviewed. The patient's tolerance of                            previous anesthesia was also reviewed. The risks                            and benefits of the procedure and the sedation                            options and risks were discussed with the patient.                            All questions were answered, and informed consent                            was obtained. Prior Anticoagulants: The patient has                            taken no anticoagulant or antiplatelet agents. ASA  Grade Assessment: III - A patient with severe                            systemic disease. After reviewing the risks and                            benefits, the patient was deemed in satisfactory                            condition to undergo the procedure.                           After obtaining informed consent, the colonoscope                            was  passed under direct vision. Throughout the                            procedure, the patient's blood pressure, pulse, and                            oxygen saturations were monitored continuously. The                            [id number]-HQ190TL ([id number]) Olympus peds colonoscope was                            introduced through the anus and advanced to the the                            cecum, identified by appendiceal orifice and                            ileocecal valve. The colonoscopy was performed                            without difficulty. The patient tolerated the                            procedure well. The quality of the bowel                            preparation was adequate to identify polyps greater                            than 5 mm in size after extensive lavage and                            suction. The ileocecal valve, appendiceal orifice,                            and rectum were photographed. Scope In: 9:37:06 AM Scope Out: 9:53:39 AM Scope Withdrawal Time: 0 hours 12 minutes 2 seconds  Total Procedure Duration: 0  hours 16 minutes 33 seconds  Findings:      The perianal and digital rectal examinations were normal.      A 5 mm polyp was found in the cecum. The polyp was sessile. The polyp       was removed with a cold snare. Resection and retrieval were complete.      Scattered small-mouthed diverticula were found in the sigmoid colon,       descending colon, ascending colon and cecum.      Non-bleeding internal hemorrhoids were found during retroflexion. The       hemorrhoids were medium-sized. Impression:               - One 5 mm polyp in the cecum, removed with a cold                            snare. Resected and retrieved.                           - Diverticulosis in the sigmoid colon, in the                            descending colon, in the ascending colon and in the                            cecum.                           - Non-bleeding internal  hemorrhoids. Recommendation:           - Patient has a contact number available for                            emergencies. The signs and symptoms of potential                            delayed complications were discussed with the                            patient. Return to normal activities tomorrow.                            Written discharge instructions were provided to the                            patient.                           - Resume previous diet.                           - Continue present medications.                           - Await pathology results.                           - Follow up with outpatient GI (Dr.Castaneda)  within 2-3 weeks                           - IV iron infusion as outpatient                           - To visualize the small bowel, perform video                            capsule endoscopy at appointment to be scheduled as                            outpatient.                           - Ok to discharge home from GI standpoint, please                            call with any questions Procedure Code(s):        --- Professional ---                           [phone], Colonoscopy, flexible; with removal of                            tumor(s), polyp(s), or other lesion(s) by snare                            technique Diagnosis Code(s):        --- Professional ---                           D12.0, Benign neoplasm of cecum                           K64.8, Other hemorrhoids                           R19.5, Other fecal abnormalities                           D50.9, Iron deficiency anemia, unspecified                           K57.30, Diverticulosis of large intestine without                            perforation or abscess without bleeding CPT copyright 2022 [employer]. All rights reserved. The codes documented in this report are preliminary and upon coder review may  be revised to meet current  compliance requirements. Mauri Pole, MD 04/05/2022 10:09:34 AM This report has been signed electronically. Number of Addenda: 0

## 2022-04-05 NOTE — TOC Progression Note (Signed)
Transition of Care [facility]) - Progression Note    Patient Details  Name: Jonathan Gilmore MRN: [id number] Date of Birth: 11/12/1950  Transition of [facility]) CM/SW Contact  Zenon Mayo, RN Phone Number: 04/05/2022, 11:21 AM  Clinical Narrative:    Patient is for dc today, he states his family member is transporting him home, he has no needs.      Barriers to Discharge: Continued Medical Work up  Expected Discharge Plan and Services       Living arrangements for the past 2 months: [place] Expected Discharge Date: 04/05/22                                     Social Determinants of Health (SDOH) Interventions SDOH Screenings   Food Insecurity: No Food Insecurity (04/03/2022)  Housing: Low Risk  (04/03/2022)  Transportation Needs: No Transportation Needs (04/03/2022)  Utilities: Not At Risk (04/03/2022)  Alcohol Screen: Low Risk  (01/20/2022)  Depression (PHQ2-9): Medium Risk (01/28/2022)  Financial Resource Strain: Low Risk  (01/20/2022)  Physical Activity: Inactive (01/20/2022)  Social Connections: Moderately Integrated (01/20/2022)  Stress: No Stress Concern Present (01/20/2022)  Tobacco Use: High Risk (04/05/2022)    Readmission Risk Interventions     No data to display

## 2022-04-06 ENCOUNTER — Telehealth: Payer: Self-pay

## 2022-04-06 ENCOUNTER — Ambulatory Visit: Payer: Medicare Other | Attending: Nurse Practitioner | Admitting: Nurse Practitioner

## 2022-04-06 ENCOUNTER — Encounter (HOSPITAL_COMMUNITY): Payer: Self-pay | Admitting: Gastroenterology

## 2022-04-06 VITALS — BP 128/70 | HR 76 | Ht 67.0 in | Wt 199.8 lb

## 2022-04-06 DIAGNOSIS — I2510 Atherosclerotic heart disease of native coronary artery without angina pectoris: Secondary | ICD-10-CM

## 2022-04-06 DIAGNOSIS — I5032 Chronic diastolic (congestive) heart failure: Secondary | ICD-10-CM

## 2022-04-06 DIAGNOSIS — Q2112 Patent foramen ovale: Secondary | ICD-10-CM

## 2022-04-06 DIAGNOSIS — E785 Hyperlipidemia, unspecified: Secondary | ICD-10-CM

## 2022-04-06 DIAGNOSIS — I10 Essential (primary) hypertension: Secondary | ICD-10-CM

## 2022-04-06 DIAGNOSIS — Z720 Tobacco use: Secondary | ICD-10-CM

## 2022-04-06 DIAGNOSIS — I350 Nonrheumatic aortic (valve) stenosis: Secondary | ICD-10-CM

## 2022-04-06 MED ORDER — NITROGLYCERIN 0.4 MG SL SUBL: 0.4000 mg | SUBLINGUAL_TABLET | SUBLINGUAL | 3 refills | Status: DC | PRN

## 2022-04-06 MED ORDER — FUROSEMIDE 40 MG PO TABS: 40.0000 mg | ORAL_TABLET | ORAL | Status: DC | PRN

## 2022-04-06 MED ORDER — NICOTINE 21 MG/24HR TD PT24: 21.0000 mg | MEDICATED_PATCH | Freq: Every day | TRANSDERMAL | 0 refills | Status: DC

## 2022-04-06 NOTE — Patient Instructions (Signed)
Medication Instructions:  Nicotine patch '21mg'$  daily x 1 month - please contact office if you need refills  Stop over the counter Potassium  Change your Lasix to as needed only  Continue all other medications.     Labwork: none  Testing/Procedures: Your physician has requested that you have an echocardiogram. Echocardiography is a painless test that uses sound waves to create images of your heart. It provides your doctor with information about the size and shape of your heart and how well your heart's chambers and valves are working. This procedure takes approximately one hour. There are no restrictions for this procedure. Please do NOT wear cologne, perfume, aftershave, or lotions (deodorant is allowed). Please arrive 15 minutes prior to your appointment time.  DUE IN 1 YEAR   Follow-Up: Dr. Harl Bowie as scheduled   Any Other Special Instructions Will Be Listed Below (If Applicable).   If you need a refill on your cardiac medications before your next appointment, please call your pharmacy.

## 2022-04-06 NOTE — Progress Notes (Unsigned)
Cardiology Office Note:    Date:  04/06/2022  ID:  [name], DOB Jun 09, 1950, MRN [id number]  PCP:  Jonathan Agreste, MD   Jonathan Gilmore Cardiologist:  Carlyle Dolly, MD     Referring MD: Jonathan Agreste, MD   CC: Here for hospital follow-up  History of Present Illness:    Jonathan Gilmore is a 72 y.o. male with a hx of the following:  CAD Chronic diastolic CHF Hyperlipidemia Hypertension Type 2 diabetes Claudication/PAD Aortic stenosis Neuropathy History of prostate cancer   Patient is a 72 year old male with past medical history as mentioned above.  CT scan of chest in 2014 showed three-vessel CAD, severe LAD disease.  NST in 2015 was negative for ischemia.  Was admitted in May 2017 with acute pulmonary edema, in setting of severe hypertension.  Was referred to cath.  In 2017 had a cardiac catheterization performed that showed 50% LAD disease, overall patent coronaries.  It was found that his presentation with pulmonary edema was likely secondary to severe hypertension in setting of restrictive diastolic dysfunction.  Echocardiogram at that time revealed EF mildly reduced at 45 to 50%, restrictive diastolic dysfunction, repeat echocardiogram in 2022 showed EF improved to 60 to 65%, mild to moderate aortic stenosis with mean gradient at 17 mmHg.  NST in 2022 showed small inferior infarct, no current ischemia.  Last seen by Dr. Carlyle Dolly on October 15, 2021. Was compliant with his medications.  Denies any symptoms of aortic stenosis.  Denied any edema.  Continue to follow vascular for history of PAD/claudication.  He also noted dysphagia with food getting stuck, and was asking to reestablish with GI as he was also due for colonoscopy.  Because he had a recent low risk stress test and denied any ongoing symptoms, his symptoms will be continue to monitor.  Dr. Kelby Fam recommended repeating echocardiogram within the next year.  Current medications  were continued.  Was told to follow-up in 6 months.  Was admitted to the hospital last week for chief complaint of exertional chest pain, was found to have Hemoccult positive with subacute anemia.  Hemoglobin dropped to 7.8 from 12.76 months ago.  EKG showed some ST depression.  Transferred from [facility] to [facility] for evaluation and GI evaluation.  Received blood transfusion.  Underwent EGD colonoscopy and both were unremarkable, polyp was removed during colonoscopy, and nonbleeding internal hemorrhoids noted.  Was placed on oral iron and folic acid as well as PPI.  Was told to follow-up with GI outpatient.  He would require outpatient capsule endoscopy for small bowel evaluation.  After transfusion, EKG was stable, troponins negative, echocardiogram was unremarkable.  Was well compensated in the hospital.  Patient politely declined further testing.  Was told to follow-up with cardiology outpatient.  Today he presents for outpatient follow-up.  He states he is doing well. Denies any chest pain, shortness of breath, palpitations, syncope, presyncope, dizziness, orthopnea, PND, swelling or significant weight changes, acute bleeding, or claudication. Smokes 1 PPD. Denies any other questions or concerns.  Past Medical History:  Diagnosis Date   Abnormal cardiovascular stress test 03/2013   Anxiety    Arthritis    Back pain    CHF (congestive heart failure) (HCC)    Coronary artery disease    Multivessel with significant LAD involvement by chest CT 2014   Depression    Diastolic dysfunction    Essential hypertension    Lumbar herniated disc    L4-L5  MRSA (methicillin resistant staph aureus) culture positive    Neuropathy    Shortness of breath dyspnea    Sleep apnea    No cpap   Type 2 diabetes mellitus [facility])     Past Surgical History:  Procedure Laterality Date   ANTERIOR CERVICAL DECOMP/DISCECTOMY FUSION  01/18/2011   Procedure: ANTERIOR CERVICAL DECOMPRESSION/DISCECTOMY FUSION 2  LEVELS;  Surgeon: Cooper Render Pool;  Location: [place] [facility];  Service: Neurosurgery;  Laterality: N/A;  anterior cervical discectomy and fusion with allograft and plating, cervical five-six, cervical six-seven   BIOPSY N/A 04/04/2015   Procedure: BIOPSY;  Surgeon: Rogene [place], MD;  [facility];  Service: Endoscopy;  Laterality: N/A;   BIOPSY  11/09/2019   Procedure: BIOPSY;  Surgeon: Harvel Quale, MD;  [facility];  Service: Gastroenterology;;   Bone spur removed  1997   Left shoulder   CARDIAC CATHETERIZATION N/A 08/08/2015   Procedure: Right/Left Heart Cath and Coronary Angiography;  Surgeon: Sherren Mocha, MD;  Location: [place] CV LAB;  Service: Cardiovascular;  Laterality: N/A;   CARPAL TUNNEL RELEASE Right    COLONOSCOPY WITH PROPOFOL N/A 11/09/2019   Procedure: COLONOSCOPY WITH PROPOFOL;  Surgeon: Harvel Quale, MD;  [facility];  Service: Gastroenterology;  Laterality: N/A;  900   COLONOSCOPY WITH PROPOFOL N/A 02/27/2021   Procedure: COLONOSCOPY WITH PROPOFOL;  Surgeon: Harvel Quale, MD;  [facility];  Service: Gastroenterology;  Laterality: N/A;  7:30   COLONOSCOPY WITH PROPOFOL N/A 04/05/2022   Procedure: COLONOSCOPY WITH PROPOFOL;  Surgeon: Mauri Pole, MD;  Location: [place];  Service: Gastroenterology;  Laterality: N/A;   ESOPHAGEAL DILATION N/A 04/04/2015   Procedure: ESOPHAGEAL DILATION;  Surgeon: Rogene [place], MD;  [facility];  Service: Endoscopy;  Laterality: N/A;   ESOPHAGEAL DILATION N/A 11/09/2019   Procedure: ESOPHAGEAL DILATION;  Surgeon: Harvel Quale, MD;  [facility];  Service: Gastroenterology;  Laterality: N/A;   ESOPHAGOGASTRODUODENOSCOPY N/A 04/04/2015   Procedure: ESOPHAGOGASTRODUODENOSCOPY (EGD);  Surgeon: Rogene [place], MD;  [facility];  Service: Endoscopy;  Laterality: N/A;  1240   ESOPHAGOGASTRODUODENOSCOPY (EGD)  WITH PROPOFOL N/A 11/09/2019   Procedure: ESOPHAGOGASTRODUODENOSCOPY (EGD) WITH PROPOFOL;  Surgeon: Harvel Quale, MD;  [facility];  Service: Gastroenterology;  Laterality: N/A;   ESOPHAGOGASTRODUODENOSCOPY (EGD) WITH PROPOFOL N/A 04/04/2022   Procedure: ESOPHAGOGASTRODUODENOSCOPY (EGD) WITH PROPOFOL;  Surgeon: Yetta Flock, MD;  Location: [place];  Service: Gastroenterology;  Laterality: N/A;   HEMORRHOID SURGERY     POLYPECTOMY  11/09/2019   Procedure: POLYPECTOMY;  Surgeon: Harvel Quale, MD;  [facility];  Service: Gastroenterology;;   POLYPECTOMY  02/27/2021   Procedure: POLYPECTOMY;  Surgeon: Montez Morita, Quillian Quince, MD;  [facility];  Service: Gastroenterology;;   POLYPECTOMY  04/05/2022   Procedure: POLYPECTOMY;  Surgeon: Mauri Pole, MD;  Location: [place] [facility];  Service: Gastroenterology;;   TONSILLECTOMY      Current Medications: Current Meds  Medication Sig   ACCU-CHEK AVIVA PLUS test strip USE AS INSTRUCTED   [START ON 04/08/2022] aspirin EC 81 MG tablet Take 1 tablet (81 mg total) by mouth daily.   atorvastatin (LIPITOR) 80 MG tablet Take 1 tablet (80 mg total) by mouth daily.   Calcium-Magnesium-Zinc (CAL-MAG-ZINC PO) Take 1 tablet by mouth daily.   ferrous sulfate 325 (65 FE) MG tablet Take 1 tablet (325 mg total) by mouth daily with breakfast.  Flaxseed, Linseed, (FLAXSEED OIL) 1000 MG CAPS Take 1,000 mg by mouth daily.   folic acid (FOLVITE) 1 MG tablet Take 1 tablet (1 mg total) by mouth daily.   gabapentin (NEURONTIN) 300 MG capsule Take 1-2 capsules (300-600 mg total) by mouth 2 (two) times daily. (Patient taking differently: Take 900-1,200 mg by mouth at bedtime.)   lisinopril (ZESTRIL) 20 MG tablet Take 1 tablet (20 mg total) by mouth daily.   magnesium oxide (MAG-OX) 400 MG tablet Take 400 mg by mouth at bedtime.   metFORMIN (GLUCOPHAGE) 500 MG tablet TAKE 2 TABLETS BY MOUTH TWICE  DAILY    metoprolol succinate (TOPROL-XL) 25 MG 24 hr tablet TAKE 1 TABLET BY MOUTH DAILY AT  BEDTIME   Multiple Vitamin (MULTIVITAMIN WITH MINERALS) TABS Take 1 tablet by mouth daily.   pantoprazole (PROTONIX) 40 MG tablet Take 1 tablet (40 mg total) by mouth daily.   furosemide (LASIX) 40 MG tablet TAKE 1 TABLET BY MOUTH DAILY    nitroGLYCERIN (NITROSTAT) 0.4 MG SL tablet Place 1 tablet (0.4 mg total) under the tongue every 5 (five) minutes x 3 doses as needed for chest pain (if no relief after 2nd dose, proceed to ED for an evaluation).    Potassium 99 MG TABS Take 1 tablet by mouth every evening.     Allergies:   Morphine and related   Social History   Socioeconomic History   Marital status: Married    Spouse name: Not on file   Number of children: Not on file   Years of education: Not on file   Highest education level: Not on file  Occupational History   Not on file  Tobacco Use   Smoking status: Every Day    Packs/day: 1.00    Years: 50.00    Total pack years: 50.00    Types: Cigarettes    Start date: 11/26/1967   Smokeless tobacco: Former   Tobacco comments:    smoking x 50 yrs  Vaping Use   Vaping Use: Never used  Substance and Sexual Activity   Alcohol use: Yes    Alcohol/week: 1.0 standard drink of alcohol    Types: 1 Standard drinks or equivalent per week    Comment: rare   Drug use: Yes    Frequency: 1.0 times per week    Types: Marijuana    Comment: as needed   Sexual activity: Yes  Other Topics Concern   Not on file  Social History Narrative   Lives with wife   Right Handed   Drinks 2-3 cups caffeine daily   Social Determinants of Health   Financial Resource Strain: Low Risk  (01/20/2022)   Overall Financial Resource Strain (CARDIA)    Difficulty of Paying Living Expenses: Not hard at all  Food Insecurity: No Food Insecurity (04/06/2022)   Hunger Vital Sign    Worried About Running Out of Food in the Last Year: Never true    Ran Out of Food in the Last  Year: Never true  Transportation Needs: No Transportation Needs (04/06/2022)   PRAPARE - Hydrologist (Medical): No    Lack of Transportation (Non-Medical): No  Recent Concern: Transportation Needs - Unmet Transportation Needs (04/06/2022)   PRAPARE - Transportation    Lack of Transportation (Medical): Yes    Lack of Transportation (Non-Medical): Yes  Physical Activity: Inactive (01/20/2022)   Exercise Vital Sign    Days of Exercise per Week: 0 days    Minutes  of Exercise per Session: 0 min  Stress: No Stress Concern Present (01/20/2022)   [place]    Feeling of Stress : Not at all  Social Connections: Moderately Integrated (01/20/2022)   Social Connection and Isolation Panel [NHANES]    Frequency of Communication with Friends and Family: More than three times a week    Frequency of Social Gatherings with Friends and Family: More than three times a week    Attends Religious Services: Never    Marine scientist or Organizations: No    Attends Music therapist: More than 4 times per year    Marital Status: Married     Family History: The patient's family history includes Cushing syndrome in his sister; Diabetes in his father and sister; Hepatitis B in his father; Lung cancer in his mother.  ROS:   Review of Systems  Constitutional: Negative.   HENT: Negative.    Eyes: Negative.   Respiratory: Negative.    Cardiovascular: Negative.   Gastrointestinal: Negative.   Genitourinary: Negative.   Musculoskeletal: Negative.   Skin: Negative.   Neurological: Negative.   Endo/Heme/Allergies: Negative.   Psychiatric/Behavioral: Negative.      Please see the history of present illness.    All other systems reviewed and are negative.  EKGs/Labs/Other Studies Reviewed:    The following studies were reviewed today:   EKG:  EKG is not ordered today.    Echocardiogram on  04/03/2022: 1. Left ventricular ejection fraction, by estimation, is 55 to 60%. The  left ventricle has normal function. The left ventricle demonstrates  regional wall motion abnormalities (see scoring diagram/findings for  description). There is moderate left  ventricular hypertrophy. Left ventricular diastolic parameters are  consistent with Grade I diastolic dysfunction (impaired relaxation).   2. Right ventricular systolic function is normal. The right ventricular  size is normal.   3. Left atrial size was severely dilated.   4. Right atrial size was mildly dilated.   5. The mitral valve is normal in structure. Mild mitral valve  regurgitation. No evidence of mitral stenosis.   6. Prior AV mean gradient 17 mmHg. The aortic valve is calcified. There  is moderate calcification of the aortic valve. There is moderate  thickening of the aortic valve. Aortic valve regurgitation is not  visualized. Moderate aortic valve stenosis. Aortic   valve mean gradient measures 23.0 mmHg. Aortic valve Vmax measures 3.08  m/s.   7. The inferior vena cava is dilated in size with <50% respiratory  variability, suggesting right atrial pressure of 15 mmHg.   8. Evidence of atrial level shunting detected by color flow Doppler.  There is a small patent foramen ovale with predominantly left to right  shunting across the atrial septum.   Comparison(s): Prior images reviewed side by side.  ABI's on 02/03/2022: Summary:  Right: Resting right ankle-brachial index indicates moderate right lower  extremity arterial disease. The right toe-brachial index is abnormal.   Left: Resting left ankle-brachial index indicates severe left lower  extremity arterial disease. The left toe-brachial index is abnormal.  Myoview on 03/02/2021:   Findings are consistent with prior myocardial infarction. The study is low risk.   1 mm down sloping ST depression in the inferolateral leads was noted with exercise.  Patient did not  achieve target HR and was switched to [place].  There was no significant ST deviation during Lexiscan infusion.   Left ventricular function is abnormal. Global function  is mildly reduced. Nuclear stress EF: 50 %. The left ventricular ejection fraction is mildly decreased (45-54%). End diastolic cavity size is mildly enlarged.   Abnormal myovue with motion and diaphragmatic attenuation Possible small inferior wall infarct from apex to base No ischemia EF 50% with inferior and apical hypokinesis   Right and left heart cath on 08/08/2015: Prox LAD to Mid LAD lesion, 50% stenosed.   1. Calcified moderate proximal-mid LAD stenosis 2. Widely patent, dominant LCx 3. Preserved cardiac output, mildly elevated right-sided intracardiac pressures   Recommend: Medical therapy for nonobstructive CAD and CHF  Recent Labs: 04/02/2022: ALT 16 04/05/2022: B Natriuretic Peptide 401.5; BUN 9; Creatinine, Ser 0.72; Hemoglobin 9.9; Magnesium 2.1; Platelets 227; Potassium 3.4; Sodium 141  Recent Lipid Panel    Component Value Date/Time   CHOL 98 04/03/2022 0447   TRIG 52 04/03/2022 0447   HDL 34 (L) 04/03/2022 0447   CHOLHDL 2.9 04/03/2022 0447   VLDL 10 04/03/2022 0447   LDLCALC 54 04/03/2022 0447   LDLCALC 45 05/06/2020 1325     Physical Exam:    VS:  BP 128/70   Pulse 76   Ht '5\' 7"'$  (1.702 m)   Wt 199 lb 12.8 oz (90.6 kg)   SpO2 96%   BMI 31.29 kg/m     Wt Readings from Last 3 Encounters:  04/06/22 199 lb 12.8 oz (90.6 kg)  04/05/22 186 lb 8.2 oz (84.6 kg)  02/03/22 190 lb 3.2 oz (86.3 kg)     GEN: Obese, 72 y.o. male in no acute distress HEENT: Normal NECK: No JVD; No carotid bruits CARDIAC: S1/S2, RRR, no murmurs, rubs, gallops; 2+ pulses RESPIRATORY:  Clear to auscultation without rales, wheezing or rhonchi  MUSCULOSKELETAL:  No edema; No deformity  SKIN: Warm and dry NEUROLOGIC:  Alert and oriented x 3 PSYCHIATRIC:  Normal affect   ASSESSMENT:    1. Coronary artery disease  involving native heart without angina pectoris, unspecified vessel or lesion type   2. Hypertension, unspecified type   3. Hyperlipidemia, unspecified hyperlipidemia type   4. Chronic diastolic heart failure ([place])   5. Aortic valve stenosis, etiology of cardiac valve disease unspecified   6. Patent foramen ovale   7. Tobacco abuse    PLAN:    In order of problems listed above:  CAD 2017 cardiac catheterization showed moderate CAD, 50% LAD lesion. NST in 2022 was negative for ischemia.  Was ruled out for MI after presentation to ER.  Symptoms of chest pressure represented demand ischemia in setting of severe anemia and aortic stenosis.  Denies any recent chest pains.  Start aspirin on April 08, 2022.  Continue atorvastatin, lisinopril, metoprolol, and nitroglycerin as needed. Heart healthy diet and regular cardiovascular exercise encouraged.   2. HTN Blood pressure stable today.  BP well-controlled at home.  Continue current medication regimen. Discussed to monitor BP at home at least 2 hours after medications and sitting for 5-10 minutes. Heart healthy diet and regular cardiovascular exercise encouraged.   3. HLD Recent labs revealed total cholesterol 98, HDL 34, LDL 54, and triglycerides 52.  Continue current medication regimen. Heart healthy diet and regular cardiovascular exercise encouraged.   4. Chronic diastolic CHF Recent echocardiogram revealed normal EF, regional wall motion abnormalities noted along the left ventricle, moderate LVH, grade 1 DD. Euvolemic and well compensated on exam.  Stop over-the-counter potassium supplement.  Switch Lasix to 40 mg daily as needed for swelling, weight gain, or shortness of breath.  Continue Toprol-XL  and lisinopril. Low sodium diet, fluid restriction <2L, and daily weights encouraged. Educated to contact our office for weight gain of 2 lbs overnight or 5 lbs in one week. Heart healthy diet and regular cardiovascular exercise encouraged.   5.  Aortic valve stenosis, small patent foramen ovale Moderate aortic stenosis noted on echocardiogram recently, with evidence of atrial level shunting detected by color-flow Doppler, there was small patent foramen ovale noted with predominantly left to right shunting across atrial septum.  He is asymptomatic.  Recommend repeating echocardiogram in 1 year for monitoring.  6. Tobacco abuse Will prescribe nicotine patch 21 mg daily for when he is ready to quit smoking.  Smoking cessation discussed and encouraged.  7. Disposition: Patient is requesting to keep his appointment with Dr. Harl Bowie.  Follow-up with Dr. Carlyle Dolly as scheduled.  Medication Adjustments/Labs and Tests Ordered: Current medicines are reviewed at length with the patient today.  Concerns regarding medicines are outlined above.  No orders of the defined types were placed in this encounter.  Meds ordered this encounter  Medications   furosemide (LASIX) 40 MG tablet    Sig: Take 1 tablet (40 mg total) by mouth as needed for edema (swelling).    Dose decreased 04/06/2022   nicotine (NICODERM CQ - DOSED IN MG/24 HOURS) 21 mg/24hr patch    Sig: Place 1 patch (21 mg total) onto the skin daily.    Dispense:  30 patch    Refill:  0    New 04/06/2022   nitroGLYCERIN (NITROSTAT) 0.4 MG SL tablet    Sig: Place 1 tablet (0.4 mg total) under the tongue every 5 (five) minutes x 3 doses as needed for chest pain (if no relief after 2nd dose, proceed to ED for an evaluation).    Dispense:  25 tablet    Refill:  3    Patient Instructions  Medication Instructions:  Nicotine patch '21mg'$  daily x 1 month - please contact office if you need refills  Stop over the counter Potassium  Change your Lasix to as needed only  Continue all other medications.     Labwork: none  Testing/Procedures: Your physician has requested that you have an echocardiogram. Echocardiography is a painless test that uses sound waves to create images of your heart.  It provides your doctor with information about the size and shape of your heart and how well your heart's chambers and valves are working. This procedure takes approximately one hour. There are no restrictions for this procedure. Please do NOT wear cologne, perfume, aftershave, or lotions (deodorant is allowed). Please arrive 15 minutes prior to your appointment time.  DUE IN 1 YEAR   Follow-Up: Dr. Harl Bowie as scheduled   Any Other Special Instructions Will Be Listed Below (If Applicable).   If you need a refill on your cardiac medications before your next appointment, please call your pharmacy.    Signed, Finis Bud, NP  04/07/2022 6:33 PM    [place]

## 2022-04-06 NOTE — Patient Outreach (Signed)
  Care Coordination TOC Note Transition Care Management Follow-up Telephone Call Date of discharge and from where: 04/05/22-[place]  Dx: "decreased hemoglobin, symptomatic anemia" How have you been since you were released from the hospital? Patient states he is doing good. He rested well last night. He has had a good breakfast. He voices no further GI issues.He had a BM yesterday when he came home and no blood/bleeding noted. Patient shares that he will be doing the small bowel capsule endoscopy soon and he is very intrigued about the whole process.  Any questions or concerns? No  Items Reviewed: Did the pt receive and understand the discharge instructions provided? Yes  Medications obtained and verified? Yes  Other? No  Any new allergies since your discharge? No  Dietary orders reviewed? Yes Do you have support at home? Yes   Home Care and Equipment/Supplies: Were home health services ordered? not applicable If so, what is the name of the agency? N/A  Has the agency set up a time to come to the patient's home? not applicable Were any new equipment or medical supplies ordered?  No What is the name of the medical supply agency? N/A Were you able to get the supplies/equipment? not applicable Do you have any questions related to the use of the equipment or supplies? No  Functional Questionnaire: (I = Independent and D = Dependent) ADLs: I  Bathing/Dressing- I  Meal Prep- I  Eating- I  Maintaining continence- I  Transferring/Ambulation- I  Managing Meds- I  Follow up appointments reviewed:  PCP Hospital f/u appt confirmed? Yes  Scheduled to see Dr. Carlota Raspberry on 04/19/22 @ [facility] f/u appt confirmed? Yes  Scheduled to see Seward Speck on 03/2322 @ 3:30pm. Are transportation arrangements needed? No  If their condition worsens, is the pt aware to call PCP or go to the Emergency Dept.? Yes Was the patient provided with contact information for the PCP's office  or ED? Yes Was to pt encouraged to call back with questions or concerns? Yes  SDOH assessments and interventions completed:   Yes SDOH Interventions Today    Flowsheet Row Most Recent Value  SDOH Interventions   Food Insecurity Interventions Intervention Not Indicated  Transportation Interventions Intervention Not Indicated       Care Coordination Interventions:  Education provided    Encounter Outcome:  Pt. Visit Completed    Enzo Montgomery, RN,BSN,CCM [facility] Telephonic Care Management Coordinator Direct Phone: [phone] Toll Free: [phone] Fax: [phone]

## 2022-04-06 NOTE — Anesthesia Postprocedure Evaluation (Signed)
Anesthesia Post Note  Patient: Jonathan Gilmore  Procedure(s) Performed: COLONOSCOPY WITH PROPOFOL POLYPECTOMY     Patient location during evaluation: Endoscopy Anesthesia Type: MAC Level of consciousness: awake and alert Pain management: pain level controlled Vital Signs Assessment: post-procedure vital signs reviewed and stable Respiratory status: spontaneous breathing, nonlabored ventilation, respiratory function stable and patient connected to nasal cannula oxygen Cardiovascular status: blood pressure returned to baseline and stable Postop Assessment: no apparent nausea or vomiting Anesthetic complications: no  No notable events documented.  Last Vitals:  Vitals:   04/05/22 1015 04/05/22 1020  BP: (!) 150/73 (!) 149/64  Pulse: 70 63  Resp: (!) 21 12  Temp:    SpO2: 97% 98%    Last Pain:  Vitals:   04/05/22 1020  TempSrc:   PainSc: 0-No pain                 Trygg Mantz L Viha Kriegel

## 2022-04-07 LAB — SURGICAL PATHOLOGY

## 2022-04-13 LAB — HM DIABETES EYE EXAM

## 2022-04-14 ENCOUNTER — Ambulatory Visit (INDEPENDENT_AMBULATORY_CARE_PROVIDER_SITE_OTHER): Payer: Medicare Other | Admitting: Urology

## 2022-04-14 ENCOUNTER — Encounter: Payer: Self-pay | Admitting: Urology

## 2022-04-14 VITALS — BP 143/63 | HR 71

## 2022-04-14 DIAGNOSIS — C61 Malignant neoplasm of prostate: Secondary | ICD-10-CM

## 2022-04-14 NOTE — Progress Notes (Signed)
04/14/2022 2:02 PM   Jonathan Gilmore [birth year] [id number]  Referring provider: Wendie Agreste, MD [address] [place],  [place] [address]  Followup prostate cancer   HPI: [name] is a 72yo here for followup for prostate cancer. PSA 3.1 up from 2.5. He remains on active surveillance.  IPSS 3 QOL 1 on no BPH therapy. Urine stream strong.   PMH: Past Medical History:  Diagnosis Date   Abnormal cardiovascular stress test 03/2013   Anxiety    Arthritis    Back pain    CHF (congestive heart failure) (HCC)    Coronary artery disease    Multivessel with significant LAD involvement by chest CT 2014   Depression    Diastolic dysfunction    Essential hypertension    Lumbar herniated disc    L4-L5   MRSA (methicillin resistant staph aureus) culture positive    Neuropathy    Shortness of breath dyspnea    Sleep apnea    No cpap   Type 2 diabetes mellitus [facility])     Surgical History: Past Surgical History:  Procedure Laterality Date   ANTERIOR CERVICAL DECOMP/DISCECTOMY FUSION  01/18/2011   Procedure: ANTERIOR CERVICAL DECOMPRESSION/DISCECTOMY FUSION 2 LEVELS;  Surgeon: Cooper Render Pool;  Location: [place] [facility];  Service: Neurosurgery;  Laterality: N/A;  anterior cervical discectomy and fusion with allograft and plating, cervical five-six, cervical six-seven   BIOPSY N/A 04/04/2015   Procedure: BIOPSY;  Surgeon: Rogene Houston, MD;  [facility];  Service: Endoscopy;  Laterality: N/A;   BIOPSY  11/09/2019   Procedure: BIOPSY;  Surgeon: Harvel Quale, MD;  [facility];  Service: Gastroenterology;;   Bone spur removed  1997   Left shoulder   CARDIAC CATHETERIZATION N/A 08/08/2015   Procedure: Right/Left Heart Cath and Coronary Angiography;  Surgeon: Sherren Mocha, MD;  Location: [facility] CV LAB;  Service: Cardiovascular;  Laterality: N/A;   CARPAL TUNNEL RELEASE Right    COLONOSCOPY WITH PROPOFOL N/A 11/09/2019   Procedure: COLONOSCOPY WITH  PROPOFOL;  Surgeon: Harvel Quale, MD;  [facility];  Service: Gastroenterology;  Laterality: N/A;  900   COLONOSCOPY WITH PROPOFOL N/A 02/27/2021   Procedure: COLONOSCOPY WITH PROPOFOL;  Surgeon: Harvel Quale, MD;  [facility];  Service: Gastroenterology;  Laterality: N/A;  7:30   COLONOSCOPY WITH PROPOFOL N/A 04/05/2022   Procedure: COLONOSCOPY WITH PROPOFOL;  Surgeon: Mauri Pole, MD;  Location: [facility];  Service: Gastroenterology;  Laterality: N/A;   ESOPHAGEAL DILATION N/A 04/04/2015   Procedure: ESOPHAGEAL DILATION;  Surgeon: Rogene Houston, MD;  [facility];  Service: Endoscopy;  Laterality: N/A;   ESOPHAGEAL DILATION N/A 11/09/2019   Procedure: ESOPHAGEAL DILATION;  Surgeon: Harvel Quale, MD;  [facility];  Service: Gastroenterology;  Laterality: N/A;   ESOPHAGOGASTRODUODENOSCOPY N/A 04/04/2015   Procedure: ESOPHAGOGASTRODUODENOSCOPY (EGD);  Surgeon: Rogene Houston, MD;  [facility];  Service: Endoscopy;  Laterality: N/A;  1240   ESOPHAGOGASTRODUODENOSCOPY (EGD) WITH PROPOFOL N/A 11/09/2019   Procedure: ESOPHAGOGASTRODUODENOSCOPY (EGD) WITH PROPOFOL;  Surgeon: Harvel Quale, MD;  [facility];  Service: Gastroenterology;  Laterality: N/A;   ESOPHAGOGASTRODUODENOSCOPY (EGD) WITH PROPOFOL N/A 04/04/2022   Procedure: ESOPHAGOGASTRODUODENOSCOPY (EGD) WITH PROPOFOL;  Surgeon: Yetta Flock, MD;  Location: [facility];  Service: Gastroenterology;  Laterality: N/A;   HEMORRHOID SURGERY     POLYPECTOMY  11/09/2019   Procedure: POLYPECTOMY;  Surgeon:  Harvel Quale, MD;  [facility];  Service: Gastroenterology;;   POLYPECTOMY  02/27/2021   Procedure: POLYPECTOMY;  Surgeon: Harvel Quale, MD;  [facility];  Service: Gastroenterology;;   POLYPECTOMY  04/05/2022   Procedure: POLYPECTOMY;  Surgeon: Mauri Pole,  MD;  Location: [facility];  Service: Gastroenterology;;   TONSILLECTOMY      Home Medications:  Allergies as of 04/14/2022       Reactions   Morphine And Related Nausea And Vomiting        Medication List        Accurate as of April 14, 2022  2:02 PM. If you have any questions, ask your nurse or doctor.          Accu-Chek Aviva Plus test strip Generic drug: glucose blood USE AS INSTRUCTED   aspirin EC 81 MG tablet Take 1 tablet (81 mg total) by mouth daily.   atorvastatin 80 MG tablet Commonly known as: LIPITOR Take 1 tablet (80 mg total) by mouth daily.   CAL-MAG-ZINC PO Take 1 tablet by mouth daily.   FeroSul 325 (65 FE) MG tablet Generic drug: ferrous sulfate Take 1 tablet (325 mg total) by mouth daily with breakfast.   Flaxseed Oil 1000 MG Caps Take 1,000 mg by mouth daily.   folic acid 1 MG tablet Commonly known as: FOLVITE Take 1 tablet (1 mg total) by mouth daily.   furosemide 40 MG tablet Commonly known as: LASIX Take 1 tablet (40 mg total) by mouth as needed for edema (swelling).   gabapentin 300 MG capsule Commonly known as: NEURONTIN Take 1-2 capsules (300-600 mg total) by mouth 2 (two) times daily. What changed:  how much to take when to take this   lisinopril 20 MG tablet Commonly known as: ZESTRIL Take 1 tablet (20 mg total) by mouth daily.   magnesium oxide 400 MG tablet Commonly known as: MAG-OX Take 400 mg by mouth at bedtime.   metFORMIN 500 MG tablet Commonly known as: GLUCOPHAGE TAKE 2 TABLETS BY MOUTH TWICE  DAILY   metoprolol succinate 25 MG 24 hr tablet Commonly known as: TOPROL-XL TAKE 1 TABLET BY MOUTH DAILY AT  BEDTIME   multivitamin with minerals Tabs tablet Take 1 tablet by mouth daily.   nicotine 21 mg/24hr patch Commonly known as: NICODERM CQ - dosed in mg/24 hours Place 1 patch (21 mg total) onto the skin daily.   nitroGLYCERIN 0.4 MG SL tablet Commonly known as: NITROSTAT Place 1 tablet (0.4 mg  total) under the tongue every 5 (five) minutes x 3 doses as needed for chest pain (if no relief after 2nd dose, proceed to ED for an evaluation).   pantoprazole 40 MG tablet Commonly known as: Protonix Take 1 tablet (40 mg total) by mouth daily.        Allergies:  Allergies  Allergen Reactions   Morphine And Related Nausea And Vomiting    Family History: Family History  Problem Relation Age of Onset   Lung cancer Mother    Diabetes Father    Hepatitis B Father    Diabetes Sister    Cushing syndrome Sister     Social History:  reports that he has been smoking cigarettes. He started smoking about 54 years ago. He has a 50.00 pack-year smoking history. He has quit using smokeless tobacco. He reports current alcohol use of about 1.0 standard drink of alcohol per week. He reports current drug use. Frequency: 1.00 time per week.  Drug: Marijuana.  ROS: All other review of systems were reviewed and are negative except what is noted above in HPI  Physical Exam: BP (!) 143/63   Pulse 71   Constitutional:  Alert and oriented, No acute distress. HEENT: [place] AT, moist mucus membranes.  Trachea midline, no masses. Cardiovascular: No clubbing, cyanosis, or edema. Respiratory: Normal respiratory effort, no increased work of breathing. GI: Abdomen is soft, nontender, nondistended, no abdominal masses GU: No CVA tenderness.  Lymph: No cervical or inguinal lymphadenopathy. Skin: No rashes, bruises or suspicious lesions. Neurologic: Grossly intact, no focal deficits, moving all 4 extremities. Psychiatric: Normal mood and affect.  Laboratory Data: Lab Results  Component Value Date   WBC 9.1 04/05/2022   HGB 9.9 (L) 04/05/2022   HCT 32.0 (L) 04/05/2022   MCV 78.0 (L) 04/05/2022   PLT 227 04/05/2022    Lab Results  Component Value Date   CREATININE 0.72 04/05/2022    Lab Results  Component Value Date   PSA 3.85 12/01/2020   PSA 2.1 02/26/2019    Lab Results  Component Value  Date   TESTOSTERONE 241.06 (L) 12/01/2020    Lab Results  Component Value Date   HGBA1C 8.3 (H) 04/02/2022    Urinalysis    Component Value Date/Time   COLORURINE STRAW (A) 01/16/2018 0938   APPEARANCEUR Clear 09/07/2021 1141   LABSPEC 1.008 01/16/2018 0938   PHURINE 7.0 01/16/2018 0938   GLUCOSEU Negative 09/07/2021 1141   HGBUR NEGATIVE 01/16/2018 0938   BILIRUBINUR Negative 09/07/2021 1141   KETONESUR NEGATIVE 01/16/2018 0938   PROTEINUR Negative 09/07/2021 1141   PROTEINUR NEGATIVE 01/16/2018 0938   UROBILINOGEN 0.2 08/23/2011 0807   NITRITE Negative 09/07/2021 1141   NITRITE NEGATIVE 01/16/2018 0938   LEUKOCYTESUR Negative 09/07/2021 1141    Lab Results  Component Value Date   LABMICR Comment 09/07/2021    Pertinent Imaging:  No results found for this or any previous visit.  No results found for this or any previous visit.  No results found for this or any previous visit.  No results found for this or any previous visit.  No results found for this or any previous visit.  No valid procedures specified. No results found for this or any previous visit.  No results found for this or any previous visit.   Assessment & Plan:    1. Prostate cancer ([place]) -We will schedule for prostate MRI - Urinalysis, Routine w reflex microscopic   No follow-ups on file.  Nicolette Bang, MD  [facility] [place]

## 2022-04-14 NOTE — Patient Instructions (Signed)

## 2022-04-15 ENCOUNTER — Encounter: Payer: Self-pay | Admitting: Family Medicine

## 2022-04-15 NOTE — Progress Notes (Signed)
This encounter was created in error - please disregard.

## 2022-04-19 ENCOUNTER — Ambulatory Visit (INDEPENDENT_AMBULATORY_CARE_PROVIDER_SITE_OTHER): Payer: Medicare Other | Admitting: Family Medicine

## 2022-04-19 VITALS — BP 126/70 | HR 71 | Temp 97.8°F | Wt 205.6 lb

## 2022-04-19 DIAGNOSIS — R0989 Other specified symptoms and signs involving the circulatory and respiratory systems: Secondary | ICD-10-CM

## 2022-04-19 DIAGNOSIS — I2089 Other forms of angina pectoris: Secondary | ICD-10-CM

## 2022-04-19 DIAGNOSIS — D649 Anemia, unspecified: Secondary | ICD-10-CM | POA: Diagnosis not present

## 2022-04-19 LAB — CBC WITH DIFFERENTIAL/PLATELET
Basophils Absolute: 0.1 10*3/uL (ref 0.0–0.1)
Basophils Relative: 0.9 % (ref 0.0–3.0)
Eosinophils Absolute: 0.4 10*3/uL (ref 0.0–0.7)
Eosinophils Relative: 4 % (ref 0.0–5.0)
HCT: 33.8 % — ABNORMAL LOW (ref 39.0–52.0)
Hemoglobin: 10.7 g/dL — ABNORMAL LOW (ref 13.0–17.0)
Lymphocytes Relative: 24.7 % (ref 12.0–46.0)
Lymphs Abs: 2.4 10*3/uL (ref 0.7–4.0)
MCHC: 31.6 g/dL (ref 30.0–36.0)
MCV: 77.1 fl — ABNORMAL LOW (ref 78.0–100.0)
Monocytes Absolute: 0.7 10*3/uL (ref 0.1–1.0)
Monocytes Relative: 7.2 % (ref 3.0–12.0)
Neutro Abs: 6 10*3/uL (ref 1.4–7.7)
Neutrophils Relative %: 63.2 % (ref 43.0–77.0)
Platelets: 290 10*3/uL (ref 150.0–400.0)
RBC: 4.39 Mil/uL (ref 4.22–5.81)
RDW: 22.5 % — ABNORMAL HIGH (ref 11.5–15.5)
WBC: 9.6 10*3/uL (ref 4.0–10.5)

## 2022-04-19 LAB — BASIC METABOLIC PANEL
BUN: 9 mg/dL (ref 6–23)
CO2: 30 mEq/L (ref 19–32)
Calcium: 9.1 mg/dL (ref 8.4–10.5)
Chloride: 100 mEq/L (ref 96–112)
Creatinine, Ser: 0.73 mg/dL (ref 0.40–1.50)
GFR: 91.61 mL/min (ref >60.00)
Glucose, Bld: 191 mg/dL — ABNORMAL HIGH (ref 70–99)
Potassium: 4.7 mEq/L (ref 3.5–5.1)
Sodium: 138 mEq/L (ref 135–145)

## 2022-04-19 NOTE — Patient Instructions (Addendum)
Keep follow up with stomach specialist and camera test as planned.  No med changes for now. Continue iron.  If concerns on labs I will let you know.  Monitor weight - recommendation from cardiology: contact if 2 pounds overnight or 5 pounds in 1 week weight gain.   You can try to have xray at [facility], but let me know if they need a different order.  Take care!  Return to the clinic or go to the nearest emergency room if any of your symptoms worsen or new symptoms occur.

## 2022-04-19 NOTE — Progress Notes (Unsigned)
Subjective:  Patient ID: Jonathan Gilmore, male    DOB: 12-25-1950  Age: 72 y.o. MRN: [id number]  CC:  Chief Complaint  Patient presents with   Hospitalization Follow-up    Pt here post hospital visit, notes was given a blood transfusion     HPI Jonathan Gilmore presents for   Hospital follow-up, transition of care visit. Transition of care phone call noted from January 23.  Stable at that time without any apparent home needs.  Here for in person follow-up today.  Admitted January 19 through January 22nd.  Plan for BMP, CBC and x-ray at follow-up appointment, GI and cardiology follow-up.   Symptomatic anemia Likely subacute GI blood loss possible upper.  Acute on chronic anemia with baseline hemoglobin 12.6 to drop to 7.8 over the prior 5 to 6 months.  2 units packed red blood cell transfusion.  Heme positive stool, EGD and colonoscopy both unremarkable with polyp removed during colonoscopy and nonbleeding internal hemorrhoids.  Started on folic acid and oral iron for iron deficiency anemia, outpatient GI follow-up planned.  Plan for outpatient capsule endoscopy for small bowel evaluation.  Last hemoglobin 9.9 on January 22.  Feeling ok since having transfusion. No new fatigue. Taking iron and folic supplements.  No melena/hematochezia. Plans to schedule capsule endoscopy this week.   Lab Results  Component Value Date   WBC 9.1 04/05/2022   HGB 9.9 (L) 04/05/2022   HCT 32.0 (L) 04/05/2022   MCV 78.0 (L) 04/05/2022   PLT 227 04/05/2022     Exertional angina Reported stable EKG after transfusion on January 21.  Negative serial troponins.  Preserved EF on echo.  Outpatient cardiology follow-up planned.  Compensated chronic diastolic CHF with EF 93%.  Jonathan Gilmore was stable on beta-blocker in regards to his hypertension and continued on statin for dyslipidemia.  Not motivated to quit smoking.  Was counseled to quit - nicotine patch was given from cardiology at follow-up. Has cut back on amount  of cigarettes.  Stable aortic stenosis and no acute issues with his PAD.  Plan for outpatient follow-up with vascular surgery, Dr. Harl Bowie. Follow-up appointment with cardiology January 23.  Symptoms of stress pressure represent demand ischemia in the setting of severe anemia and aortic stenosis.  Started on aspirin January 25, continued on atorvastatin, lisinopril, metoprolol and nitroglycerin if needed.  Jonathan Gilmore was advised to stop over-the-counter potassium supplement and switch Lasix to 40 mg daily as needed for swelling, weight gain or shortness of breath.  2 L fluid restriction and low-sodium diet.  Daily weights with cardiology contact if 2 pounds overnight or 5 pounds in 1 week gain.  Feeling well as above.  No chest pain or dyspnea.  Home weights about 185-190. Variable.  Taking furosemide daily. Potassium few times per week.   Lab Results  Component Value Date   K 3.4 (L) 04/05/2022   Lab Results  Component Value Date   CREATININE 0.72 04/05/2022       Wt Readings from Last 3 Encounters:  04/19/22 205 lb 9.6 oz (93.3 kg)  04/06/22 199 lb 12.8 oz (90.6 kg)  04/05/22 186 lb 8.2 oz (84.6 kg)    Outpatient follow-up with urologist for prostate cancer and continued on same medications for his diabetes and other chronic issues.   History Patient Active Problem List   Diagnosis Date Noted   Gastrointestinal hemorrhage 04/05/2022   Heme positive stool 04/04/2022   Decreased hemoglobin 04/04/2022   Acute blood loss anemia 04/02/2022  Prostate cancer ([place]) 06/08/2021   Esophagitis 02/09/2021   History of colonic polyps 02/09/2021   Intestinal metaplasia of antrum of stomach without dysplasia 02/09/2021   Insomnia 03/12/2019   Pure hypercholesterolemia 11/29/2018   Hypogonadism, male 11/23/2018   Essential hypertension, benign 11/13/2018   High risk medication use 10/28/2018   Carotid stenosis, asymptomatic, bilateral 10/28/2018   Tobacco use 10/28/2018   Polyp of colon  10/28/2018   Lumbar disc herniation with radiculopathy 12/21/2017   Spinal stenosis of lumbar region without neurogenic claudication 12/21/2017   CAD -50% LAD  08/09/2015   Moderate aortic stenosis 08/09/2015   Acute pulmonary edema ([place]) 49/67/5916   Acute diastolic CHF (congestive heart failure) ([place]) 08/07/2015   Hypersomnia with sleep apnea, unspecified 09/20/2013   Syncope 09/19/2013   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   OSA (obstructive sleep apnea) -non compliant 09/19/2013   Narcolepsy and cataplexy 09/19/2013   Facial abscess 04/28/2013   Facial cellulitis 04/28/2013   Mixed hyperlipidemia 04/28/2013   Tobacco dependence 04/28/2013   Sepsis ([place]) 04/28/2013   Gout 38/46/6599   Diastolic CHF ([place]) 35/70/1779   Pleuritic pain 02/24/2013   Chest pain 02/24/2013   Murmur 02/24/2013   Pericarditis 02/24/2013   DM type 2 causing vascular disease ([place]) 02/24/2013   Essential hypertension 02/24/2013   Cervical spondylosis with myelopathy 01/19/2011   Past Medical History:  Diagnosis Date   Abnormal cardiovascular stress test 03/2013   Anxiety    Arthritis    Back pain    CHF (congestive heart failure) ([place])    Coronary artery disease    Multivessel with significant LAD involvement by chest CT 2014   Depression    Diastolic dysfunction    Essential hypertension    Lumbar herniated disc    L4-L5   MRSA (methicillin resistant staph aureus) culture positive    Neuropathy    Shortness of breath dyspnea    Sleep apnea    No cpap   Type 2 diabetes mellitus ([place])    Past Surgical History:  Procedure Laterality Date   ANTERIOR CERVICAL DECOMP/DISCECTOMY FUSION  01/18/2011   Procedure: ANTERIOR CERVICAL DECOMPRESSION/DISCECTOMY FUSION 2 LEVELS;  Surgeon: Cooper Render Pool;  Location: [facility];  Service: Neurosurgery;  Laterality: N/A;  anterior cervical discectomy and fusion with allograft and plating, cervical five-six, cervical six-seven   BIOPSY N/A 04/04/2015    Procedure: BIOPSY;  Surgeon: Rogene Houston, MD;  [facility];  Service: Endoscopy;  Laterality: N/A;   BIOPSY  11/09/2019   Procedure: BIOPSY;  Surgeon: Harvel Quale, MD;  [facility];  Service: Gastroenterology;;   Bone spur removed  1997   Left shoulder   CARDIAC CATHETERIZATION N/A 08/08/2015   Procedure: Right/Left Heart Cath and Coronary Angiography;  Surgeon: Sherren Mocha, MD;  Location: [facility];  Service: Cardiovascular;  Laterality: N/A;   CARPAL TUNNEL RELEASE Right    COLONOSCOPY WITH PROPOFOL N/A 11/09/2019   Procedure: COLONOSCOPY WITH PROPOFOL;  Surgeon: Harvel Quale, MD;  [facility];  Service: Gastroenterology;  Laterality: N/A;  900   COLONOSCOPY WITH PROPOFOL N/A 02/27/2021   Procedure: COLONOSCOPY WITH PROPOFOL;  Surgeon: Harvel Quale, MD;  [facility];  Service: Gastroenterology;  Laterality: N/A;  7:30   COLONOSCOPY WITH PROPOFOL N/A 04/05/2022   Procedure: COLONOSCOPY WITH PROPOFOL;  Surgeon: Mauri Pole, MD;  Location: [facility];  Service: Gastroenterology;  Laterality: N/A;   ESOPHAGEAL DILATION N/A 04/04/2015  Procedure: ESOPHAGEAL DILATION;  Surgeon: Rogene Houston, MD;  [facility];  Service: Endoscopy;  Laterality: N/A;   ESOPHAGEAL DILATION N/A 11/09/2019   Procedure: ESOPHAGEAL DILATION;  Surgeon: Harvel Quale, MD;  [facility];  Service: Gastroenterology;  Laterality: N/A;   ESOPHAGOGASTRODUODENOSCOPY N/A 04/04/2015   Procedure: ESOPHAGOGASTRODUODENOSCOPY (EGD);  Surgeon: Rogene Houston, MD;  [facility];  Service: Endoscopy;  Laterality: N/A;  1240   ESOPHAGOGASTRODUODENOSCOPY (EGD) WITH PROPOFOL N/A 11/09/2019   Procedure: ESOPHAGOGASTRODUODENOSCOPY (EGD) WITH PROPOFOL;  Surgeon: Harvel Quale, MD;  [facility];  Service: Gastroenterology;  Laterality: N/A;   ESOPHAGOGASTRODUODENOSCOPY  (EGD) WITH PROPOFOL N/A 04/04/2022   Procedure: ESOPHAGOGASTRODUODENOSCOPY (EGD) WITH PROPOFOL;  Surgeon: Yetta Flock, MD;  Location: [facility];  Service: Gastroenterology;  Laterality: N/A;   HEMORRHOID SURGERY     POLYPECTOMY  11/09/2019   Procedure: POLYPECTOMY;  Surgeon: Harvel Quale, MD;  [facility];  Service: Gastroenterology;;   POLYPECTOMY  02/27/2021   Procedure: POLYPECTOMY;  Surgeon: Harvel Quale, MD;  [facility];  Service: Gastroenterology;;   POLYPECTOMY  04/05/2022   Procedure: POLYPECTOMY;  Surgeon: Mauri Pole, MD;  Location: [facility];  Service: Gastroenterology;;   TONSILLECTOMY     Allergies  Allergen Reactions   Morphine And Related Nausea And Vomiting   Prior to Admission medications   Medication Sig Start Date End Date Taking? Authorizing Provider  ACCU-CHEK AVIVA PLUS test strip USE AS INSTRUCTED 06/13/19   Maryruth Hancock, MD  aspirin EC 81 MG tablet Take 1 tablet (81 mg total) by mouth daily. 04/08/22   Thurnell Lose, MD  atorvastatin (LIPITOR) 80 MG tablet Take 1 tablet (80 mg total) by mouth daily. 10/22/21   Wendie Agreste, MD  Calcium-Magnesium-Zinc (CAL-MAG-ZINC PO) Take 1 tablet by mouth daily.    [provider]  ferrous sulfate 325 (65 FE) MG tablet Take 1 tablet (325 mg total) by mouth daily with breakfast. 04/05/22   Thurnell Lose, MD  Flaxseed, Linseed, (FLAXSEED OIL) 1000 MG CAPS Take 1,000 mg by mouth daily.    [provider]  folic acid (FOLVITE) 1 MG tablet Take 1 tablet (1 mg total) by mouth daily. 04/05/22   Thurnell Lose, MD  furosemide (LASIX) 40 MG tablet Take 1 tablet (40 mg total) by mouth as needed for edema (swelling). 04/06/22   Finis Bud, NP  gabapentin (NEURONTIN) 300 MG capsule Take 1-2 capsules (300-600 mg total) by mouth 2 (two) times daily. Patient taking differently: Take 900-1,200 mg by mouth at bedtime. 10/22/21   Wendie Agreste, MD  lisinopril (ZESTRIL) 20 MG tablet Take 1 tablet (20 mg total) by mouth daily. 02/23/21 10/16/78  Wendie Agreste, MD  magnesium oxide (MAG-OX) 400 MG tablet Take 400 mg by mouth at bedtime.    [provider]  metFORMIN (GLUCOPHAGE) 500 MG tablet TAKE 2 TABLETS BY MOUTH TWICE  DAILY 10/12/21   Wendie Agreste, MD  metoprolol succinate (TOPROL-XL) 25 MG 24 hr tablet TAKE 1 TABLET BY MOUTH DAILY AT  BEDTIME 01/07/22   Arnoldo Lenis, MD  Multiple Vitamin (MULTIVITAMIN WITH MINERALS) TABS Take 1 tablet by mouth daily.    [provider]  nicotine (NICODERM CQ - DOSED IN MG/24 HOURS) 21 mg/24hr patch Place 1 patch (21 mg total) onto the skin daily. 04/06/22   Finis Bud, NP  nitroGLYCERIN (NITROSTAT) 0.4 MG SL tablet Place  1 tablet (0.4 mg total) under the tongue every 5 (five) minutes x 3 doses as needed for chest pain (if no relief after 2nd dose, proceed to ED for an evaluation). 04/06/22   Finis Bud, NP  pantoprazole (PROTONIX) 40 MG tablet Take 1 tablet (40 mg total) by mouth daily. 04/05/22   Thurnell Lose, MD   Social History   Socioeconomic History   Marital status: Married    Spouse name: Not on file   Number of children: Not on file   Years of education: Not on file   Highest education level: Not on file  Occupational History   Not on file  Tobacco Use   Smoking status: Every Day    Packs/day: 1.00    Years: 50.00    Total pack years: 50.00    Types: Cigarettes    Start date: 11/26/1967   Smokeless tobacco: Former   Tobacco comments:    smoking x 50 yrs  Vaping Use   Vaping Use: Never used  Substance and Sexual Activity   Alcohol use: Yes    Alcohol/week: 1.0 standard drink of alcohol    Types: 1 Standard drinks or equivalent per week    Comment: rare   Drug use: Yes    Frequency: 1.0 times per week    Types: Marijuana    Comment: as needed   Sexual activity: Yes  Other Topics Concern   Not on file  Social History Narrative    Lives with wife   Right Handed   Drinks 2-3 cups caffeine daily   Social Determinants of Health   Financial Resource Strain: Low Risk  (01/20/2022)   Overall Financial Resource Strain (CARDIA)    Difficulty of Paying Living Expenses: Not hard at all  Food Insecurity: No Food Insecurity (04/06/2022)   Hunger Vital Sign    Worried About Running Out of Food in the Last Year: Never true    Ran Out of Food in the Last Year: Never true  Transportation Needs: No Transportation Needs (04/06/2022)   PRAPARE - Hydrologist (Medical): No    Lack of Transportation (Non-Medical): No  Recent Concern: Transportation Needs - Unmet Transportation Needs (04/06/2022)   PRAPARE - Transportation    Lack of Transportation (Medical): Yes    Lack of Transportation (Non-Medical): Yes  Physical Activity: Inactive (01/20/2022)   Exercise Vital Sign    Days of Exercise per Week: 0 days    Minutes of Exercise per Session: 0 min  Stress: No Stress Concern Present (01/20/2022)   [place]    Feeling of Stress : Not at all  Social Connections: Moderately Integrated (01/20/2022)   Social Connection and Isolation Panel [NHANES]    Frequency of Communication with Friends and Family: More than three times a week    Frequency of Social Gatherings with Friends and Family: More than three times a week    Attends Religious Services: Never    Marine scientist or Organizations: No    Attends Music therapist: More than 4 times per year    Marital Status: Married  Human resources officer Violence: Not At Risk (04/03/2022)   Humiliation, Afraid, Rape, and Kick questionnaire    Fear of Current or Ex-Partner: No    Emotionally Abused: No    Physically Abused: No    Sexually Abused: No    Review of Systems  Per HPI.  Objective:  Vitals:   04/19/22 1058  BP: 126/70  Pulse: 71  Temp: 97.8 F (36.6 C)  TempSrc:  Temporal  SpO2: 94%  Weight: 205 lb 9.6 oz (93.3 kg)     Physical Exam Vitals reviewed.  Constitutional:      Appearance: Jonathan Gilmore is well-developed.  HENT:     Head: Normocephalic and atraumatic.  Neck:     Vascular: No carotid bruit or JVD.  Cardiovascular:     Rate and Rhythm: Normal rate and regular rhythm.     Heart sounds: Murmur (2/6 systolic.) heard.  Pulmonary:     Effort: Pulmonary effort is normal.     Breath sounds: Rales (faint at bases.) present.  Musculoskeletal:     Right lower leg: No edema.     Left lower leg: No edema.  Skin:    General: Skin is warm and dry.  Neurological:     Mental Status: Jonathan Gilmore is alert and oriented to person, place, and time.  Psychiatric:        Mood and Affect: Mood normal.        Assessment & Plan:  Jonathan Gilmore is a 72 y.o. male . Pulmonary vascular congestion - Plan: DG Chest 2 View, Basic Metabolic Panel (BMET)  -Updated chest x-ray ordered, check BMP.  Weight monitoring as recommended by cardiology for signs of fluid overload, no med changes for now.  Anemia, unspecified type - Plan: CBC w/Diff  -Improved after transfusion as above, tolerating.  Symptomatically improved, continue same.  Exertional angina  -Evaluated by cardiology as above, prior negative troponins.  Asymptomatic at present.  RTC/ER precautions given.  No orders of the defined types were placed in this encounter.  Patient Instructions  Keep follow up with stomach specialist and camera test as planned.  No med changes for now. Continue iron.  If concerns on labs I will let you know.  Monitor weight - recommendation from cardiology: contact if 2 pounds overnight or 5 pounds in 1 week weight gain.   You can try to have xray at [facility], but let me know if they need a different order.  Take care!  Return to the clinic or go to the nearest emergency room if any of your symptoms worsen or new symptoms occur.       Signed,   Merri Ray,  MD [place] 04/19/22 12:03 PM

## 2022-04-20 ENCOUNTER — Encounter: Payer: Self-pay | Admitting: Gastroenterology

## 2022-04-20 ENCOUNTER — Encounter: Payer: Self-pay | Admitting: Family Medicine

## 2022-04-22 ENCOUNTER — Encounter: Payer: Self-pay | Admitting: *Deleted

## 2022-04-22 ENCOUNTER — Encounter (INDEPENDENT_AMBULATORY_CARE_PROVIDER_SITE_OTHER): Payer: Self-pay | Admitting: Gastroenterology

## 2022-04-22 ENCOUNTER — Ambulatory Visit (INDEPENDENT_AMBULATORY_CARE_PROVIDER_SITE_OTHER): Payer: Medicare Other | Admitting: Gastroenterology

## 2022-04-22 VITALS — BP 137/74 | HR 73 | Temp 97.5°F | Ht 67.0 in | Wt 200.5 lb

## 2022-04-22 DIAGNOSIS — D500 Iron deficiency anemia secondary to blood loss (chronic): Secondary | ICD-10-CM

## 2022-04-22 DIAGNOSIS — Z8719 Personal history of other diseases of the digestive system: Secondary | ICD-10-CM | POA: Diagnosis not present

## 2022-04-22 DIAGNOSIS — K635 Polyp of colon: Secondary | ICD-10-CM | POA: Diagnosis not present

## 2022-04-22 DIAGNOSIS — K209 Esophagitis, unspecified without bleeding: Secondary | ICD-10-CM

## 2022-04-22 MED ORDER — PANTOPRAZOLE SODIUM 40 MG PO TBEC: 40.0000 mg | DELAYED_RELEASE_TABLET | Freq: Every day | ORAL | 3 refills | Status: DC

## 2022-04-22 MED ORDER — FERROUS SULFATE 325 (65 FE) MG PO TABS: 325.0000 mg | ORAL_TABLET | Freq: Every day | ORAL | 3 refills | Status: DC

## 2022-04-22 NOTE — Patient Instructions (Addendum)
Schedule capsule endoscopy Continue oral iron daily Continue pantoprazole 40 mg every day Will repeat CBC in iron stores in follow-up appointment.

## 2022-04-22 NOTE — Progress Notes (Addendum)
Jonathan Gilmore, M.D. Gastroenterology & Hepatology [facility] [address] [place], [place] [address]  Primary Care Physician: Wendie Agreste, MD [address] [place] [address]  I will communicate my assessment and recommendations to the referring MD via EMR.  Problems: Recurrent iron deficiency anemia H/o esophagitis History of colon polyps  History of Present Illness: Jonathan Gilmore is a 72 y.o. male with Pmh anxiety, diabetes, moderate aortic stenosis, CHF. HTN, depression, CAD,  who presents for follow up of iron deficiency anemia.  The patient was last seen in our clinic on 02/09/2021. At that time, the patient was scheduled for colonoscopy due to history of large polyps and fair prep and was started on omeprazole 40 mg every day for management of esophagitis.  Patient was admitted to [facility] on April 02, 2022.  Was presenting severe microcytic anemia with positive fecal occult blood test.  Found to have a drop of hemoglobin from 14.6 down to 7.8 for which he required to be transfused.  Iron studies showed low iron of 31 and saturation of 8% with TIBC of 412, ferritin underwent EGD and colonoscopy by Dr. Carlean Purl and Nandigam -EGD on 04/04/2022 showed a normal esophagus, stomach, duodenum.  Colonoscopy on 04/05/22 showed a 5 mm polyp in the cecum ( had hyperplastic changes).    Patient states that he still feels fatigued but this is better compared to prior.  Noted that his stools are darker after he started taking oral iron but he denies having any other associated symptoms such as nausea, vomiting, fever, chills, hematochezia, melena, hematemesis, abdominal distention, abdominal pain, diarrhea, jaundice, pruritus or weight loss.  Most recent hemoglobin was 10.7.  States he is currently taking oral iron once a day and Protonix once a day.  He was not previously taking iron before his admission.  Not taking any  anticoagulants or high-dose antiplatelets.   Last EGD: As above Previous  Colonoscopies: 02/2021-presents anal condyloma, 7 polyps were found in the transverse, ascending colon and cecum, polyps measured between 4 to 10 mm.  There was presence of moderate amount of semisolid stool All polyps were tubular adenomas  10/2019 - fair colon prep -he had a total of 5 polyps removed from the colon with polyps up to 20 mm x 2 and a 12 mm polyps x1 (pathology consistent with tubular adenomas), diverticulosis.   Last colonoscopy as described above  Past Medical History: Past Medical History:  Diagnosis Date   Abnormal cardiovascular stress test 03/2013   Anxiety    Arthritis    Back pain    CHF (congestive heart failure) (HCC)    Coronary artery disease    Multivessel with significant LAD involvement by chest CT 2014   Depression    Diastolic dysfunction    Essential hypertension    Lumbar herniated disc    L4-L5   MRSA (methicillin resistant staph aureus) culture positive    Neuropathy    Shortness of breath dyspnea    Sleep apnea    No cpap   Type 2 diabetes mellitus ([place])     Past Surgical History: Past Surgical History:  Procedure Laterality Date   ANTERIOR CERVICAL DECOMP/DISCECTOMY FUSION  01/18/2011   Procedure: ANTERIOR CERVICAL DECOMPRESSION/DISCECTOMY FUSION 2 LEVELS;  Surgeon: Cooper Render Pool;  Location: [facility];  Service: Neurosurgery;  Laterality: N/A;  anterior cervical discectomy and fusion with allograft and plating, cervical five-six, cervical six-seven   BIOPSY N/A  04/04/2015   Procedure: BIOPSY;  Surgeon: Rogene Houston, MD;  [facility];  Service: Endoscopy;  Laterality: N/A;   BIOPSY  11/09/2019   Procedure: BIOPSY;  Surgeon: Harvel Quale, MD;  [facility];  Service: Gastroenterology;;   Bone spur removed  1997   Left shoulder   CARDIAC CATHETERIZATION N/A 08/08/2015   Procedure: Right/Left Heart Cath and Coronary  Angiography;  Surgeon: Sherren Mocha, MD;  Location: [facility];  Service: Cardiovascular;  Laterality: N/A;   CARPAL TUNNEL RELEASE Right    COLONOSCOPY WITH PROPOFOL N/A 11/09/2019   Procedure: COLONOSCOPY WITH PROPOFOL;  Surgeon: Harvel Quale, MD;  [facility];  Service: Gastroenterology;  Laterality: N/A;  900   COLONOSCOPY WITH PROPOFOL N/A 02/27/2021   Procedure: COLONOSCOPY WITH PROPOFOL;  Surgeon: Harvel Quale, MD;  [facility];  Service: Gastroenterology;  Laterality: N/A;  7:30   COLONOSCOPY WITH PROPOFOL N/A 04/05/2022   Procedure: COLONOSCOPY WITH PROPOFOL;  Surgeon: Mauri Pole, MD;  Location: [facility];  Service: Gastroenterology;  Laterality: N/A;   ESOPHAGEAL DILATION N/A 04/04/2015   Procedure: ESOPHAGEAL DILATION;  Surgeon: Rogene Houston, MD;  [facility];  Service: Endoscopy;  Laterality: N/A;   ESOPHAGEAL DILATION N/A 11/09/2019   Procedure: ESOPHAGEAL DILATION;  Surgeon: Harvel Quale, MD;  [facility];  Service: Gastroenterology;  Laterality: N/A;   ESOPHAGOGASTRODUODENOSCOPY N/A 04/04/2015   Procedure: ESOPHAGOGASTRODUODENOSCOPY (EGD);  Surgeon: Rogene Houston, MD;  [facility];  Service: Endoscopy;  Laterality: N/A;  1240   ESOPHAGOGASTRODUODENOSCOPY (EGD) WITH PROPOFOL N/A 11/09/2019   Procedure: ESOPHAGOGASTRODUODENOSCOPY (EGD) WITH PROPOFOL;  Surgeon: Harvel Quale, MD;  [facility];  Service: Gastroenterology;  Laterality: N/A;   ESOPHAGOGASTRODUODENOSCOPY (EGD) WITH PROPOFOL N/A 04/04/2022   Procedure: ESOPHAGOGASTRODUODENOSCOPY (EGD) WITH PROPOFOL;  Surgeon: Yetta Flock, MD;  Location: [facility];  Service: Gastroenterology;  Laterality: N/A;   HEMORRHOID SURGERY     POLYPECTOMY  11/09/2019   Procedure: POLYPECTOMY;  Surgeon: Harvel Quale, MD;  [facility];  Service: Gastroenterology;;    POLYPECTOMY  02/27/2021   Procedure: POLYPECTOMY;  Surgeon: Montez Morita, Quillian Quince, MD;  [facility];  Service: Gastroenterology;;   POLYPECTOMY  04/05/2022   Procedure: POLYPECTOMY;  Surgeon: Mauri Pole, MD;  [facility];  Service: Gastroenterology;;   TONSILLECTOMY      Family History: Family History  Problem Relation Age of Onset   Lung cancer Mother    Diabetes Father    Hepatitis B Father    Diabetes Sister    Cushing syndrome Sister     Social History: Social History   Tobacco Use  Smoking Status Every Day   Packs/day: 1.00   Years: 50.00   Total pack years: 50.00   Types: Cigarettes   Start date: 11/26/1967  Smokeless Tobacco Former  Tobacco Comments   smoking x 50 yrs   Social History   Substance and Sexual Activity  Alcohol Use Yes   Alcohol/week: 1.0 standard drink of alcohol   Types: 1 Standard drinks or equivalent per week   Comment: rare   Social History   Substance and Sexual Activity  Drug Use Yes   Frequency: 1.0 times per week   Types: Marijuana   Comment: as needed    Allergies: Allergies  Allergen Reactions   Morphine And Related Nausea And Vomiting    Medications: Current Outpatient Medications  Medication Sig Dispense Refill   ACCU-CHEK AVIVA PLUS test strip USE AS INSTRUCTED 100 strip 3   aspirin EC 81 MG tablet Take 1 tablet (81 mg total) by mouth daily. 30 tablet 11   atorvastatin (LIPITOR) 80 MG tablet Take 1 tablet (80 mg total) by mouth daily. 90 tablet 1   Calcium-Magnesium-Zinc (CAL-MAG-ZINC PO) Take 1 tablet by mouth daily.     folic acid (FOLVITE) 1 MG tablet Take 1 tablet (1 mg total) by mouth daily. 30 tablet 0   furosemide (LASIX) 40 MG tablet Take 1 tablet (40 mg total) by mouth as needed for edema (swelling).     gabapentin (NEURONTIN) 300 MG capsule Take 1-2 capsules (300-600 mg total) by mouth 2 (two) times daily. (Patient taking differently: Take 900-1,200 mg by mouth at bedtime.)  270 capsule 3   lisinopril (ZESTRIL) 20 MG tablet Take 1 tablet (20 mg total) by mouth daily. 90 tablet 3   magnesium oxide (MAG-OX) 400 MG tablet Take 400 mg by mouth at bedtime.     metFORMIN (GLUCOPHAGE) 500 MG tablet TAKE 2 TABLETS BY MOUTH TWICE  DAILY 360 tablet 3   metoprolol succinate (TOPROL-XL) 25 MG 24 hr tablet TAKE 1 TABLET BY MOUTH DAILY AT  BEDTIME 90 tablet 3   Multiple Vitamin (MULTIVITAMIN WITH MINERALS) TABS Take 1 tablet by mouth daily.     nitroGLYCERIN (NITROSTAT) 0.4 MG SL tablet Place 1 tablet (0.4 mg total) under the tongue every 5 (five) minutes x 3 doses as needed for chest pain (if no relief after 2nd dose, proceed to ED for an evaluation). 25 tablet 3   ferrous sulfate 325 (65 FE) MG tablet Take 1 tablet (325 mg total) by mouth daily with breakfast. 90 tablet 3   pantoprazole (PROTONIX) 40 MG tablet Take 1 tablet (40 mg total) by mouth daily. 90 tablet 3   No current facility-administered medications for this visit.    Review of Systems: GENERAL: negative for malaise, night sweats HEENT: No changes in hearing or vision, no nose bleeds or other nasal problems. NECK: Negative for lumps, goiter, pain and significant neck swelling RESPIRATORY: Negative for cough, wheezing CARDIOVASCULAR: Negative for chest pain, leg swelling, palpitations, orthopnea GI: SEE HPI MUSCULOSKELETAL: Negative for joint pain or swelling, back pain, and muscle pain. SKIN: Negative for lesions, rash PSYCH: Negative for sleep disturbance, mood disorder and recent psychosocial stressors. HEMATOLOGY Negative for prolonged bleeding, bruising easily, and swollen nodes. ENDOCRINE: Negative for cold or heat intolerance, polyuria, polydipsia and goiter. NEURO: negative for tremor, gait imbalance, syncope and seizures. The remainder of the review of systems is noncontributory.   Physical Exam: BP 137/74 (BP Location: Left Arm, Patient Position: Sitting, Cuff Size: Large)   Pulse 73   Temp (!)  97.5 F (36.4 C) (Temporal)   Ht '5\' 7"'$  (1.702 m)   Wt 200 lb 8 oz (90.9 kg)   BMI 31.40 kg/m  GENERAL: The patient is AO x3, in no acute distress. Obese. HEENT: Head is normocephalic and atraumatic. EOMI are intact. Mouth is well hydrated and without lesions. NECK: Supple. No masses LUNGS: Clear to auscultation. No presence of rhonchi/wheezing/rales. Adequate chest expansion HEART: RRR, normal s1 and s2. ABDOMEN: Soft, nontender, no guarding, no peritoneal signs, and nondistended. BS +. No masses. EXTREMITIES: Without any cyanosis, clubbing, rash, lesions or edema. NEUROLOGIC: AOx3, no focal motor deficit. SKIN: no jaundice, no rashes  Imaging/Labs: as above  I personally reviewed and interpreted the available labs, imaging and endoscopic  files.  Impression and Plan: Jonathan Gilmore is a 72 y.o. male with Pmh anxiety, diabetes, moderate aortic stenosis, CHF, HTN, depression, CAD,  who presents for follow up of iron deficiency anemia.  Patient presented return iron deficiency anemia and required a blood transfusion with adequate response.  He has not presented any overt gastrointestinal bleeding.  Underwent repeat EGD and colonoscopy which did not show any active bleeding or lesions that could explain his anemia.  We discussed that it is possible he may have AVMs causing his chronic loss, especially in the setting of moderate aortic stenosis.  We discussed in detail the importance in evaluating the source of his anemia with a capsule endoscopy which he is agreeable to proceed with as this will allow [place] to determine the presence of lesions and approximatel location of these.  For now, he should continue taking his oral iron supplementation once a day and we will recheck his iron stores and CBC in his follow-up appointment in 3 months.  Even though he has not presented any overt gastrointestinal bleeding, he has a history of reflux esophagitis for which he will need to continue pantoprazole 40 mg  every day.  Finally, given his history of multiple polyps in the past along with inadequate preps, I will recommend repeating a colonoscopy in 1 year.  -Schedule capsule endoscopy -Continue oral iron daily -Continue pantoprazole 40 mg every day -Will repeat CBC in iron stores in follow-up appointment. -Will plan for repeat colonoscopy in 1 year  All questions were answered.      Jonathan Peppers, MD Gastroenterology and Hepatology [facility]

## 2022-04-23 ENCOUNTER — Telehealth: Payer: Self-pay | Admitting: *Deleted

## 2022-04-23 NOTE — Telephone Encounter (Signed)
[employer] PA: pending The notification/prior authorization reference number is [id number].

## 2022-04-24 ENCOUNTER — Other Ambulatory Visit: Payer: Self-pay | Admitting: Family Medicine

## 2022-04-24 DIAGNOSIS — E1159 Type 2 diabetes mellitus with other circulatory complications: Secondary | ICD-10-CM

## 2022-04-29 NOTE — Telephone Encounter (Signed)
[employer] PA: approved [id number] DOS: 05/11/22-08/09/22

## 2022-05-05 ENCOUNTER — Encounter: Payer: Self-pay | Admitting: Family Medicine

## 2022-05-05 ENCOUNTER — Ambulatory Visit: Payer: Medicare Other | Admitting: Cardiology

## 2022-05-05 DIAGNOSIS — D649 Anemia, unspecified: Secondary | ICD-10-CM

## 2022-05-05 NOTE — Progress Notes (Incomplete)
Clinical Summary Mr. Denn is a 72 y.o.male  seen today for follow up of the following medical problems.      1. CAD - CT scan chest 02/2013 with 3 vessel CAD, severe LAD disease. - Jan 2015 nuclear stress without clear ischemia - admit 07/2015 with acute pulmonary edema in setting of severe HTN. Referred for cath.  - cath 07/2015 with 50% LAD disease, overall patent coronaries - his presentation with pulmonary edema likely secondary to severe HTN in setting of restrictive diastolic dysfunction. - echo 07/2015 LVEF 45-50%, restrictive diastolic dysfunction [id number] echo LVEF 60-65%   - recent chest pains, some SOB - started 2 days ago. Pressure epigastric/midchest, 3/10 in severity. Some associated SOB few seconds.. Lasted a few hours constant, not positional. No association with food or eating.  - no repeat episodes -compliant with meds.      02/2021 nuclear stress: small inferior infarct, no current ischemia  -admit Jan 2024 with chest pain - no evidence of ACS. Found to be severely anemic Hgb down to 7.8. Symptoms resolved with transfusion Jan 2024 echo: LVEF 55-60%, no WMAs, grade I dd     2. Severe OSA - not using CPAP machine due to discomfort. Stopped several years ago     3. Hyperlipidemia -11/2018 TC 82 HDL 25 TG 137 LDL 35 - he is compliant with statin   - 08/2019 TC 106 HDL 37 TG 100 LDL 51  - 04/2020 TC 103 HDL 35 TG 143 LDL 45 - 11/2020 TC 87 HDL 35 LDL 38 TG 69   4. Aortic stenosis - echo 07/2015 mild to moderate. Mean gradient 16, AVA reported at 0.9 with dimensionless index 0.17 Mar 2018 echo LVEF 60-65%, mild to moderate stenosis   08/2020 echo LVEF 60-65%, no WMAs, grade I dd, mild MR, mild AI, mild to mod AS mean grade 17, AVA VTI 1.12 DI 0.29 - no recent symptoms.   Jan 2024 echo: LVEF 55-60%, AV mean grad 23, AVA VTI 0.9   5. HTN - compliant withmeds     6. Chronic diastolic HF - no recent edema   7. DM2  - followed by pcp     8.  Claudication/PAD - followed by vascular   9. Dysphagia - episode of food getting stuck, reports prior issues along with esophageal dilatation in the past. Asking to reestablish with GI, reports also due for colonscopy   10.Neuropathy   11. Prostate cancer - followed by urology   AAA screen: 2017 [place] was normal   SH: his wife, Emmanuel Busler is also a patient of mine. 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  Past Medical History:  Diagnosis Date   Abnormal cardiovascular stress test 03/2013   Anxiety    Arthritis    Back pain    CHF (congestive heart failure) (HCC)    Coronary artery disease    Multivessel with significant LAD involvement by chest CT 2014   Depression    Diastolic dysfunction    Essential hypertension    Lumbar herniated disc    L4-L5   MRSA (methicillin resistant staph aureus) culture positive    Neuropathy    Shortness of breath dyspnea    Sleep apnea    No cpap   Type 2 diabetes mellitus (HCC)      Allergies  Allergen Reactions   Morphine And Related Nausea And  Vomiting     Current Outpatient Medications  Medication Sig Dispense Refill   ACCU-CHEK AVIVA PLUS test strip USE AS INSTRUCTED 100 strip 3   aspirin EC 81 MG tablet Take 1 tablet (81 mg total) by mouth daily. 30 tablet 11   atorvastatin (LIPITOR) 80 MG tablet TAKE 1 TABLET BY MOUTH DAILY 90 tablet 3   Calcium-Magnesium-Zinc (CAL-MAG-ZINC PO) Take 1 tablet by mouth daily.     ferrous sulfate 325 (65 FE) MG tablet Take 1 tablet (325 mg total) by mouth daily with breakfast. 90 tablet 3   folic acid (FOLVITE) 1 MG tablet Take 1 tablet (1 mg total) by mouth daily. 30 tablet 0   furosemide (LASIX) 40 MG tablet Take 1 tablet (40 mg total) by mouth as needed for edema (swelling).     gabapentin (NEURONTIN) 300 MG capsule Take 1-2 capsules (300-600 mg total) by mouth 2 (two) times daily. (Patient taking  differently: Take 900-1,200 mg by mouth at bedtime.) 270 capsule 3   lisinopril (ZESTRIL) 20 MG tablet Take 1 tablet (20 mg total) by mouth daily. 90 tablet 3   magnesium oxide (MAG-OX) 400 MG tablet Take 400 mg by mouth at bedtime.     metFORMIN (GLUCOPHAGE) 500 MG tablet TAKE 2 TABLETS BY MOUTH TWICE  DAILY 360 tablet 3   metoprolol succinate (TOPROL-XL) 25 MG 24 hr tablet TAKE 1 TABLET BY MOUTH DAILY AT  BEDTIME 90 tablet 3   Multiple Vitamin (MULTIVITAMIN WITH MINERALS) TABS Take 1 tablet by mouth daily.     nitroGLYCERIN (NITROSTAT) 0.4 MG SL tablet Place 1 tablet (0.4 mg total) under the tongue every 5 (five) minutes x 3 doses as needed for chest pain (if no relief after 2nd dose, proceed to ED for an evaluation). 25 tablet 3   pantoprazole (PROTONIX) 40 MG tablet Take 1 tablet (40 mg total) by mouth daily. 90 tablet 3   No current facility-administered medications for this visit.     Past Surgical History:  Procedure Laterality Date   ANTERIOR CERVICAL DECOMP/DISCECTOMY FUSION  01/18/2011   Procedure: ANTERIOR CERVICAL DECOMPRESSION/DISCECTOMY FUSION 2 LEVELS;  Surgeon: Cooper Render Pool;  Location: [facility];  Service: Neurosurgery;  Laterality: N/A;  anterior cervical discectomy and fusion with allograft and plating, cervical five-six, cervical six-seven   BIOPSY N/A 04/04/2015   Procedure: BIOPSY;  Surgeon: Rogene Houston, MD;  [facility];  Service: Endoscopy;  Laterality: N/A;   BIOPSY  11/09/2019   Procedure: BIOPSY;  Surgeon: Harvel Quale, MD;  [facility];  Service: Gastroenterology;;   Bone spur removed  1997   Left shoulder   CARDIAC CATHETERIZATION N/A 08/08/2015   Procedure: Right/Left Heart Cath and Coronary Angiography;  Surgeon: Sherren Mocha, MD;  Location: [facility];  Service: Cardiovascular;  Laterality: N/A;   CARPAL TUNNEL RELEASE Right    COLONOSCOPY WITH PROPOFOL N/A 11/09/2019   Procedure: COLONOSCOPY WITH PROPOFOL;   Surgeon: Harvel Quale, MD;  [facility];  Service: Gastroenterology;  Laterality: N/A;  900   COLONOSCOPY WITH PROPOFOL N/A 02/27/2021   Procedure: COLONOSCOPY WITH PROPOFOL;  Surgeon: Harvel Quale, MD;  [facility];  Service: Gastroenterology;  Laterality: N/A;  7:30   COLONOSCOPY WITH PROPOFOL N/A 04/05/2022   Procedure: COLONOSCOPY WITH PROPOFOL;  Surgeon: Mauri Pole, MD;  Location: [facility];  Service: Gastroenterology;  Laterality: N/A;   ESOPHAGEAL DILATION N/A 04/04/2015   Procedure: ESOPHAGEAL DILATION;  Surgeon:  Rogene Houston, MD;  [facility];  Service: Endoscopy;  Laterality: N/A;   ESOPHAGEAL DILATION N/A 11/09/2019   Procedure: ESOPHAGEAL DILATION;  Surgeon: Harvel Quale, MD;  [facility];  Service: Gastroenterology;  Laterality: N/A;   ESOPHAGOGASTRODUODENOSCOPY N/A 04/04/2015   Procedure: ESOPHAGOGASTRODUODENOSCOPY (EGD);  Surgeon: Rogene Houston, MD;  [facility];  Service: Endoscopy;  Laterality: N/A;  1240   ESOPHAGOGASTRODUODENOSCOPY (EGD) WITH PROPOFOL N/A 11/09/2019   Procedure: ESOPHAGOGASTRODUODENOSCOPY (EGD) WITH PROPOFOL;  Surgeon: Harvel Quale, MD;  [facility];  Service: Gastroenterology;  Laterality: N/A;   ESOPHAGOGASTRODUODENOSCOPY (EGD) WITH PROPOFOL N/A 04/04/2022   Procedure: ESOPHAGOGASTRODUODENOSCOPY (EGD) WITH PROPOFOL;  Surgeon: Yetta Flock, MD;  Location: [facility];  Service: Gastroenterology;  Laterality: N/A;   HEMORRHOID SURGERY     POLYPECTOMY  11/09/2019   Procedure: POLYPECTOMY;  Surgeon: Harvel Quale, MD;  [facility];  Service: Gastroenterology;;   POLYPECTOMY  02/27/2021   Procedure: POLYPECTOMY;  Surgeon: Harvel Quale, MD;  [facility];  Service: Gastroenterology;;   POLYPECTOMY  04/05/2022   Procedure: POLYPECTOMY;  Surgeon: Mauri Pole, MD;   Location: [facility];  Service: Gastroenterology;;   TONSILLECTOMY       Allergies  Allergen Reactions   Morphine And Related Nausea And Vomiting      Family History  Problem Relation Age of Onset   Lung cancer Mother    Diabetes Father    Hepatitis B Father    Diabetes Sister    Cushing syndrome Sister      Social History Mr. Saldierna reports that he has been smoking cigarettes. He started smoking about 54 years ago. He has a 50.00 pack-year smoking history. He has quit using smokeless tobacco. Mr. Obara reports current alcohol use of about 1.0 standard drink of alcohol per week.   Review of Systems CONSTITUTIONAL: No weight loss, fever, chills, weakness or fatigue.  HEENT: Eyes: No visual loss, blurred vision, double vision or yellow sclerae.No hearing loss, sneezing, congestion, runny nose or sore throat.  SKIN: No rash or itching.  CARDIOVASCULAR:  RESPIRATORY: No shortness of breath, cough or sputum.  GASTROINTESTINAL: No anorexia, nausea, vomiting or diarrhea. No abdominal pain or blood.  GENITOURINARY: No burning on urination, no polyuria NEUROLOGICAL: No headache, dizziness, syncope, paralysis, ataxia, numbness or tingling in the extremities. No change in bowel or bladder control.  MUSCULOSKELETAL: No muscle, back pain, joint pain or stiffness.  LYMPHATICS: No enlarged nodes. No history of splenectomy.  PSYCHIATRIC: No history of depression or anxiety.  ENDOCRINOLOGIC: No reports of sweating, cold or heat intolerance. No polyuria or polydipsia.  Marland Kitchen   Physical Examination There were no vitals filed for this visit. There were no vitals filed for this visit.  Gen: resting comfortably, no acute distress HEENT: no scleral icterus, pupils equal round and reactive, no palptable cervical adenopathy,  CV Resp: Clear to auscultation bilaterally GI: abdomen is soft, non-tender, non-distended, normal bowel sounds, no hepatosplenomegaly MSK: extremities are warm, no edema.   Skin: warm, no rash Neuro:  no focal deficits Psych: appropriate affect   Diagnostic Studies  Jan 2015 MPI IMPRESSION: 1.  Abnormal exercise myocardial perfusion imaging stress test   2. Inferior wall defect likely due to subdiaphragmatic attenuation, its wall motion is similar to other areas without a perfusion defect.   3. Likely small anteroseptal infract, this area is more hypokinetic than the rest of the myocardium   3.  Low left ventricular systolic function with global hypokinesis   4. Mildly reduced functional capacity (90% of age and gender predicted)   5. Overall increased risk for major cardiac events based on low ejection fraction. There is no current myocardium at jeopardy.     02/2013 echo Study Conclusions  - Left ventricle: The cavity size was normal. There was mild   concentric hypertrophy. Systolic function was normal. The   estimated ejection fraction was in the range of 55% to   60%. Wall motion was normal; there were no regional wall   motion abnormalities. There is mildly asynchronous   contraction consistent with intraventricular conduction   delay (IVCD) or bundle Myles Mallicoat block. Doppler parameters   are consistent with abnormal left ventricular relaxation   (grade 1 diastolic dysfunction). - Aortic valve: Cusp separation was mildly reduced. There   was mild to moderate stenosis. - Left atrium: The atrium was moderately dilated.   07/2015 echo   Study Conclusions   - Left ventricle: The cavity size was normal. Wall thickness was   increased in a pattern of mild LVH. Systolic function was mildly   reduced. The estimated ejection fraction was in the range of 45%   to 50%. Diffuse hypokinesis. Doppler parameters are consistent   with restrictive physiology, indicative of decreased left   ventricular diastolic compliance and/or increased left atrial   pressure. Doppler parameters are consistent with high ventricular   filling pressure. -  Aortic valve: Valve mobility was restricted. There was mild to   moderate stenosis. Valve area (VTI): 0.96 cm^2. Valve area   (Vmax): 0.91 cm^2. Valve area (Vmean): 0.89 cm^2. - Mitral valve: Calcified annulus. There was mild regurgitation. - Left atrium: The atrium was moderately dilated. - Right atrium: The atrium was mildly dilated. - Atrial septum: There was a patent foramen ovale.   Impressions:   - Mild global reduction in LV function; restrictive filling with   elevated filling pressure; mild LVH; biatrial enlargement;   heavily calcified aortic valve with probable mild to moderate AS   (mean gradient 16 mmHg; calculated AVA .9 cm2 likely   overestimates severity; dimensionless index .3 not supportive of   severe AS); mild MR; patent foramen ovale.   07/2015 Cath Prox LAD to Mid LAD lesion, 50% stenosed.   1. Calcified moderate proximal-mid LAD stenosis 2. Widely patent, dominant LCx 3. Preserved cardiac output, mildly elevated right-sided intracardiac pressures   Recommend: Medical therapy for nonobstructive CAD and CHF   01/2016 AAA [place] Screen No aneurysm   08/2020 echo   IMPRESSIONS     1. Left ventricular ejection fraction, by estimation, is 60 to 65%. The  left ventricle has normal function. The left ventricle has no regional  wall motion abnormalities. There is moderate left ventricular hypertrophy.  Left ventricular diastolic  parameters are consistent with Grade I diastolic dysfunction (impaired  relaxation). Elevated left atrial pressure. The average left ventricular  global longitudinal strain is -17.6 %.   2. Right ventricular systolic function is normal. The right ventricular  size is normal.   3. The mitral valve is abnormal. Mild mitral valve regurgitation. No  evidence of mitral stenosis.   4. The aortic valve has an indeterminant number of cusps. There is  moderate calcification of the aortic valve. There is moderate thickening  of the aortic valve.  Aortic valve regurgitation is mild. Mild to moderate  aortic valve stenosis. Mild to moderate   aortic stenosis is present. Aortic valve mean gradient  measures 17.0  mmHg. Aortic valve peak gradient measures 26.7 mmHg. Aortic valve area, by  VTI measures 1.12 cm.    02/2021 nuclear stress  Findings are consistent with prior myocardial infarction. The study is low risk.   1 mm down sloping ST depression in the inferolateral leads was noted with exercise.  Patient did not achieve target HR and was switched to [place].  There was no significant ST deviation during Lexiscan infusion.   Left ventricular function is abnormal. Global function is mildly reduced. Nuclear stress EF: 50 %. The left ventricular ejection fraction is mildly decreased (45-54%). End diastolic cavity size is mildly enlarged.   Abnormal myovue with motion and diaphragmatic attenuation Possible small inferior wall infarct from apex to base No ischemia EF 50% with inferior and apical hypokinesis    Assessment and Plan   1. CAD - moderate nonobstructive disease by prior cath.  - recent low risk stress test -denies any ongoing symptoms, continue to monitor     2. Hyperlipidemia - has been at goal, continue current meds   3. Aortic stenosis - mild to moderate by most recent echo  -continue to monitor, likely repeat echo next year   4. HTN -at goal, continue current meds   5. Chronic diasotlic heart failure -doing well, continue curren tmeds     Arnoldo Lenis, M.D., F.A.C.C.

## 2022-05-05 NOTE — Telephone Encounter (Signed)
I have faxed the order for [facility] in [address]). Pt was notified of this.

## 2022-05-05 NOTE — Telephone Encounter (Signed)
I have placed an order for a CBC to have that done today, but if he is feeling any acute change in symptoms should be seen in person by a provider or ER if needed.

## 2022-05-10 ENCOUNTER — Telehealth: Payer: Self-pay | Admitting: Family Medicine

## 2022-05-10 NOTE — Telephone Encounter (Signed)
Patients can always come here for labs we have authorized

## 2022-05-10 NOTE — Telephone Encounter (Signed)
Caller name: ELVIN GIOVINO  On DPR?: Yes  Call back number: [phone] (mobile)  Provider they see: Wendie Agreste, MD  Reason for call: Pt states he went to lab for blood work and they had no order. He would like to come here- advise

## 2022-05-11 ENCOUNTER — Encounter (HOSPITAL_COMMUNITY)
Admission: RE | Disposition: A | Discharge status: Home / Self Care | Payer: Self-pay | Source: Home / Self Care | Attending: Gastroenterology

## 2022-05-11 ENCOUNTER — Ambulatory Visit (HOSPITAL_COMMUNITY)
Admission: RE | Admit: 2022-05-11 | Discharge: 2022-05-11 | Disposition: A | Discharge status: Home / Self Care | Payer: Medicare Other | Attending: Gastroenterology | Admitting: Gastroenterology

## 2022-05-11 ENCOUNTER — Encounter (HOSPITAL_COMMUNITY): Payer: Self-pay | Admitting: Gastroenterology

## 2022-05-11 DIAGNOSIS — D500 Iron deficiency anemia secondary to blood loss (chronic): Secondary | ICD-10-CM | POA: Diagnosis not present

## 2022-05-11 DIAGNOSIS — Z79899 Other long term (current) drug therapy: Secondary | ICD-10-CM | POA: Insufficient documentation

## 2022-05-11 DIAGNOSIS — Z9889 Other specified postprocedural states: Secondary | ICD-10-CM

## 2022-05-11 DIAGNOSIS — K5520 Angiodysplasia of colon without hemorrhage: Secondary | ICD-10-CM | POA: Insufficient documentation

## 2022-05-11 HISTORY — PX: GIVENS CAPSULE STUDY: SHX5432

## 2022-05-11 SURGERY — IMAGING PROCEDURE, GI TRACT, INTRALUMINAL, VIA CAPSULE

## 2022-05-12 ENCOUNTER — Encounter (HOSPITAL_COMMUNITY): Payer: Self-pay | Admitting: Gastroenterology

## 2022-05-12 ENCOUNTER — Other Ambulatory Visit (INDEPENDENT_AMBULATORY_CARE_PROVIDER_SITE_OTHER): Payer: Medicare Other

## 2022-05-12 DIAGNOSIS — D649 Anemia, unspecified: Secondary | ICD-10-CM | POA: Diagnosis not present

## 2022-05-12 LAB — CBC
HCT: 37.1 % — ABNORMAL LOW (ref 39.0–52.0)
Hemoglobin: 11.7 g/dL — ABNORMAL LOW (ref 13.0–17.0)
MCHC: 31.5 g/dL (ref 30.0–36.0)
MCV: 77.4 fl — ABNORMAL LOW (ref 78.0–100.0)
Platelets: 313 10*3/uL (ref 150.0–400.0)
RBC: 4.8 Mil/uL (ref 4.22–5.81)
RDW: 23.6 % — ABNORMAL HIGH (ref 11.5–15.5)
WBC: 8.2 10*3/uL (ref 4.0–10.5)

## 2022-05-12 NOTE — Procedures (Addendum)
Small Bowel [place] Capsule Study Procedure date: 05/11/2022  Referring Provider: PCP PCP:  Dr. Carlota Raspberry, Ranell Patrick, MD  Indication for procedure:  IDA  Findings:  Study was adequate as capsule reached the cecum and the bowel preparation was adequate in the small bowel. There was presence of a few small non bleeding AVMs in the proximal small bowel, 2 nonbleeding AVMs were found in the distal small bowel.  No other abnormalities were seen.  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  -Continue with oral iron supplementation -Proceed with push enteroscopy -Will discuss with cardiology possibility of aortic valve repair - TAVR to decrease risk of recurrent AVMs (Heyde's syndrome).  I personally communicated these recommendations to the patient's wife.  I also left a detailed voice message to the patient as he did not pick up the call.  DaMaylon PeppersMD Gastroenterology and Hepatology [facility]

## 2022-05-13 ENCOUNTER — Telehealth (INDEPENDENT_AMBULATORY_CARE_PROVIDER_SITE_OTHER): Payer: Self-pay | Admitting: *Deleted

## 2022-05-13 NOTE — Telephone Encounter (Signed)
Left message to return call.  

## 2022-05-13 NOTE — Telephone Encounter (Signed)
-----   Message from Harvel Quale, MD sent at 05/12/2022  4:26 PM EST ----- Hi Tanya/Rebekah Sprinkle,   Can you please schedule a push enteroscopy? Dx: iron deficiency anemia, AVMs in capsule endoscopy. Room: 3  Thanks,  Maylon Peppers, MD Gastroenterology and Hepatology [facility] Gastroenterology

## 2022-05-17 ENCOUNTER — Encounter: Payer: Self-pay | Admitting: Cardiology

## 2022-05-19 ENCOUNTER — Ambulatory Visit (INDEPENDENT_AMBULATORY_CARE_PROVIDER_SITE_OTHER): Payer: Medicare Other | Admitting: Family Medicine

## 2022-05-19 ENCOUNTER — Encounter: Payer: Self-pay | Admitting: Family Medicine

## 2022-05-19 VITALS — BP 124/60 | HR 67 | Temp 97.7°F | Ht 67.0 in | Wt 196.6 lb

## 2022-05-19 DIAGNOSIS — K5520 Angiodysplasia of colon without hemorrhage: Secondary | ICD-10-CM | POA: Diagnosis not present

## 2022-05-19 DIAGNOSIS — D649 Anemia, unspecified: Secondary | ICD-10-CM

## 2022-05-19 DIAGNOSIS — I350 Nonrheumatic aortic (valve) stenosis: Secondary | ICD-10-CM

## 2022-05-19 NOTE — Telephone Encounter (Signed)
Awaiting PA from [facility] prior authorization/notification reference number is: [id number].

## 2022-05-19 NOTE — Telephone Encounter (Signed)
Pt contacted and scheduled push enteroscopy on 06/08/22. Instructions sent to pt via my chart.

## 2022-05-19 NOTE — Telephone Encounter (Signed)
PA approved through [facility]  [id number] DOS 05/19/22-08/17/22

## 2022-05-19 NOTE — Progress Notes (Signed)
Subjective:  Patient ID: Jonathan Gilmore, male    DOB: 11/19/1950  Age: 72 y.o. MRN: [id number]  CC:  Chief Complaint  Patient presents with   Procedure    Notes he is going to have a stent placed soon. Pt notes a procedure done recently to look down his throat and notes the provider was going to send PCP a message     HPI Jonathan Gilmore presents for   Last seen February 5 for transition of care/hospital follow-up for symptomatic anemia.  Likely subacute GI blood loss.  Acute on chronic anemia with drop of hemoglobin from 12.6-7.8.  EGD, colonoscopy both unremarkable, nonbleeding internal hemorrhoids.  Outpatient GI follow-up planned for capsule endoscopy.  He was continued on iron and folic acid supplements and improving at that visit without new signs of intestinal bleeding. He continued to take furosemide daily and potassium few times per week with his history of exertional dyspnea, diastolic CHF.  He has undergone capsule endoscopy.  AVMs noted,  Plan for push enteroscopy.  GI with [facility] gastroenterology, Dr. Jenetta Downer. Has call into cardiology about aortic stenosis questions.   Most recent hemoglobin stable at 11.7.  Improved from prior hemoglobin 10.7 on February 5.  Some dark stools with iron. No change in fatigue since blood draw. No new CP/dyspnea.     Lab Results  Component Value Date   WBC 8.2 05/12/2022   HGB 11.7 (L) 05/12/2022   HCT 37.1 (L) 05/12/2022   MCV 77.4 (L) 05/12/2022   PLT 313.0 05/12/2022   Wt Readings from Last 3 Encounters:  05/19/22 196 lb 9.6 oz (89.2 kg)  04/22/22 200 lb 8 oz (90.9 kg)  04/19/22 205 lb 9.6 oz (93.3 kg)     History Patient Active Problem List   Diagnosis Date Noted   Iron deficiency anemia due to chronic blood loss 04/22/2022   History of esophagitis 04/22/2022   Heme positive stool 04/04/2022   Decreased hemoglobin 04/04/2022   Acute blood loss anemia 04/02/2022   Prostate cancer ([place]) 06/08/2021   History of colonic  polyps 02/09/2021   Intestinal metaplasia of antrum of stomach without dysplasia 02/09/2021   Insomnia 03/12/2019   Pure hypercholesterolemia 11/29/2018   Hypogonadism, male 11/23/2018   Essential hypertension, benign 11/13/2018   High risk medication use 10/28/2018   Carotid stenosis, asymptomatic, bilateral 10/28/2018   Tobacco use 10/28/2018   Polyp of colon 10/28/2018   Lumbar disc herniation with radiculopathy 12/21/2017   Spinal stenosis of lumbar region without neurogenic claudication 12/21/2017   CAD -50% LAD  08/09/2015   Moderate aortic stenosis 08/09/2015   Acute pulmonary edema ([place]) 0000000   Acute diastolic CHF (congestive heart failure) ([place]) 08/07/2015   Hypersomnia with sleep apnea, unspecified 09/20/2013   Syncope 09/19/2013   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) 04/28/2013   Gout 0000000   Diastolic CHF ([place]) 0000000   Pleuritic pain 02/24/2013   Chest pain 02/24/2013   Murmur 02/24/2013   Pericarditis 02/24/2013   DM type 2 causing vascular disease ([place]) 02/24/2013   Essential hypertension 02/24/2013   Cervical spondylosis with myelopathy 01/19/2011   Past Medical History:  Diagnosis Date   Abnormal cardiovascular stress test 03/2013   Anxiety    Arthritis    Back pain  CHF (congestive heart failure) (HCC)    Coronary artery disease    Multivessel with significant LAD involvement by chest CT 2014   Depression    Diastolic dysfunction    Essential hypertension    Lumbar herniated disc    L4-L5   MRSA (methicillin resistant staph aureus) culture positive    Neuropathy    Shortness of breath dyspnea    Sleep apnea    No cpap   Type 2 diabetes mellitus [facility])    Past Surgical History:  Procedure  Laterality Date   ANTERIOR CERVICAL DECOMP/DISCECTOMY FUSION  01/18/2011   Procedure: ANTERIOR CERVICAL DECOMPRESSION/DISCECTOMY FUSION 2 LEVELS;  Surgeon: Cooper Render Pool;  Location: [place] [facility];  Service: Neurosurgery;  Laterality: N/A;  anterior cervical discectomy and fusion with allograft and plating, cervical five-six, cervical six-seven   BIOPSY N/A 04/04/2015   Procedure: BIOPSY;  Surgeon: Rogene Houston, MD;  [facility];  Service: Endoscopy;  Laterality: N/A;   BIOPSY  11/09/2019   Procedure: BIOPSY;  Surgeon: Harvel Quale, MD;  [facility];  Service: Gastroenterology;;   Bone spur removed  1997   Left shoulder   CARDIAC CATHETERIZATION N/A 08/08/2015   Procedure: Right/Left Heart Cath and Coronary Angiography;  Surgeon: Sherren Mocha, MD;  Location: [place] CV LAB;  Service: Cardiovascular;  Laterality: N/A;   CARPAL TUNNEL RELEASE Right    COLONOSCOPY WITH PROPOFOL N/A 11/09/2019   Procedure: COLONOSCOPY WITH PROPOFOL;  Surgeon: Harvel Quale, MD;  [facility];  Service: Gastroenterology;  Laterality: N/A;  900   COLONOSCOPY WITH PROPOFOL N/A 02/27/2021   Procedure: COLONOSCOPY WITH PROPOFOL;  Surgeon: Harvel Quale, MD;  [facility];  Service: Gastroenterology;  Laterality: N/A;  7:30   COLONOSCOPY WITH PROPOFOL N/A 04/05/2022   Procedure: COLONOSCOPY WITH PROPOFOL;  Surgeon: Mauri Pole, MD;  Location: [place];  Service: Gastroenterology;  Laterality: N/A;   ESOPHAGEAL DILATION N/A 04/04/2015   Procedure: ESOPHAGEAL DILATION;  Surgeon: Rogene Houston, MD;  [facility];  Service: Endoscopy;  Laterality: N/A;   ESOPHAGEAL DILATION N/A 11/09/2019   Procedure: ESOPHAGEAL DILATION;  Surgeon: Harvel Quale, MD;  [facility];  Service: Gastroenterology;  Laterality: N/A;   ESOPHAGOGASTRODUODENOSCOPY N/A 04/04/2015   Procedure: ESOPHAGOGASTRODUODENOSCOPY  (EGD);  Surgeon: Rogene Houston, MD;  [facility];  Service: Endoscopy;  Laterality: N/A;  1240   ESOPHAGOGASTRODUODENOSCOPY (EGD) WITH PROPOFOL N/A 11/09/2019   Procedure: ESOPHAGOGASTRODUODENOSCOPY (EGD) WITH PROPOFOL;  Surgeon: Harvel Quale, MD;  [facility];  Service: Gastroenterology;  Laterality: N/A;   ESOPHAGOGASTRODUODENOSCOPY (EGD) WITH PROPOFOL N/A 04/04/2022   Procedure: ESOPHAGOGASTRODUODENOSCOPY (EGD) WITH PROPOFOL;  Surgeon: Yetta Flock, MD;  Location: [place];  Service: Gastroenterology;  Laterality: N/A;   GIVENS CAPSULE STUDY N/A 05/11/2022   Procedure: GIVENS CAPSULE STUDY;  Surgeon: Harvel Quale, MD;  [facility];  Service: Gastroenterology;  Laterality: N/A;  7:30 am   HEMORRHOID SURGERY     POLYPECTOMY  11/09/2019   Procedure: POLYPECTOMY;  Surgeon: Harvel Quale, MD;  [facility];  Service: Gastroenterology;;   POLYPECTOMY  02/27/2021   Procedure: POLYPECTOMY;  Surgeon: Harvel Quale, MD;  [facility];  Service: Gastroenterology;;   POLYPECTOMY  04/05/2022   Procedure: POLYPECTOMY;  Surgeon: Mauri Pole, MD;  Location: [place];  Service: Gastroenterology;;   TONSILLECTOMY     Allergies  Allergen Reactions  Morphine And Related Nausea And Vomiting   Prior to Admission medications   Medication Sig Start Date End Date Taking? Authorizing Provider  ACCU-CHEK AVIVA PLUS test strip USE AS INSTRUCTED 06/13/19   Maryruth Hancock, MD  aspirin EC 81 MG tablet Take 1 tablet (81 mg total) by mouth daily. 04/08/22   Thurnell Lose, MD  atorvastatin (LIPITOR) 80 MG tablet TAKE 1 TABLET BY MOUTH DAILY 04/26/22   Wendie Agreste, MD  Calcium-Magnesium-Zinc (CAL-MAG-ZINC PO) Take 1 tablet by mouth daily.    [provider]  ferrous sulfate 325 (65 FE) MG tablet Take 1 tablet (325 mg total) by mouth daily with breakfast. 04/22/22   Montez Morita, Quillian Quince, MD  folic acid (FOLVITE) 1 MG tablet Take 1 tablet (1 mg total) by mouth daily. 04/05/22   Thurnell Lose, MD  furosemide (LASIX) 40 MG tablet Take 1 tablet (40 mg total) by mouth as needed for edema (swelling). 04/06/22   Finis Bud, NP  gabapentin (NEURONTIN) 300 MG capsule Take 1-2 capsules (300-600 mg total) by mouth 2 (two) times daily. Patient taking differently: Take 900-1,200 mg by mouth at bedtime. 10/22/21   Wendie Agreste, MD  lisinopril (ZESTRIL) 20 MG tablet Take 1 tablet (20 mg total) by mouth daily. 02/23/21 10/16/78  Wendie Agreste, MD  magnesium oxide (MAG-OX) 400 MG tablet Take 400 mg by mouth at bedtime.    [provider]  metFORMIN (GLUCOPHAGE) 500 MG tablet TAKE 2 TABLETS BY MOUTH TWICE  DAILY 10/12/21   Wendie Agreste, MD  metoprolol succinate (TOPROL-XL) 25 MG 24 hr tablet TAKE 1 TABLET BY MOUTH DAILY AT  BEDTIME 01/07/22   Arnoldo Lenis, MD  Multiple Vitamin (MULTIVITAMIN WITH MINERALS) TABS Take 1 tablet by mouth daily.    [provider]  nitroGLYCERIN (NITROSTAT) 0.4 MG SL tablet Place 1 tablet (0.4 mg total) under the tongue every 5 (five) minutes x 3 doses as needed for chest pain (if no relief after 2nd dose, proceed to ED for an evaluation). 04/06/22   Finis Bud, NP  pantoprazole (PROTONIX) 40 MG tablet Take 1 tablet (40 mg total) by mouth daily. 04/22/22   Harvel Quale, MD   Social History   Socioeconomic History   Marital status: Married    Spouse name: Not on file   Number of children: Not on file   Years of education: Not on file   Highest education level: Not on file  Occupational History   Not on file  Tobacco Use   Smoking status: Every Day    Packs/day: 1.00    Years: 50.00    Total pack years: 50.00    Types: Cigarettes    Start date: 11/26/1967   Smokeless tobacco: Former   Tobacco comments:    smoking x 50 yrs  Vaping Use   Vaping Use: Never used  Substance and Sexual  Activity   Alcohol use: Yes    Alcohol/week: 1.0 standard drink of alcohol    Types: 1 Standard drinks or equivalent per week    Comment: rare   Drug use: Yes    Frequency: 1.0 times per week    Types: Marijuana    Comment: as needed   Sexual activity: Yes  Other Topics Concern   Not on file  Social History Narrative   Lives with wife   Right Handed   Drinks 2-3 cups caffeine daily   Social Determinants of Radio broadcast assistant  Strain: Low Risk  (01/20/2022)   Overall Financial Resource Strain (CARDIA)    Difficulty of Paying Living Expenses: Not hard at all  Food Insecurity: No Food Insecurity (04/06/2022)   Hunger Vital Sign    Worried About Running Out of Food in the Last Year: Never true    Ran Out of Food in the Last Year: Never true  Transportation Needs: No Transportation Needs (04/06/2022)   PRAPARE - Hydrologist (Medical): No    Lack of Transportation (Non-Medical): No  Recent Concern: Transportation Needs - Unmet Transportation Needs (04/06/2022)   PRAPARE - Transportation    Lack of Transportation (Medical): Yes    Lack of Transportation (Non-Medical): Yes  Physical Activity: Inactive (01/20/2022)   Exercise Vital Sign    Days of Exercise per Week: 0 days    Minutes of Exercise per Session: 0 min  Stress: No Stress Concern Present (01/20/2022)   [place]    Feeling of Stress : Not at all  Social Connections: Moderately Integrated (01/20/2022)   Social Connection and Isolation Panel [NHANES]    Frequency of Communication with Friends and Family: More than three times a week    Frequency of Social Gatherings with Friends and Family: More than three times a week    Attends Religious Services: Never    Marine scientist or Organizations: No    Attends Music therapist: More than 4 times per year    Marital Status: Married  Human resources officer Violence:  Not At Risk (04/03/2022)   Humiliation, Afraid, Rape, and Kick questionnaire    Fear of Current or Ex-Partner: No    Emotionally Abused: No    Physically Abused: No    Sexually Abused: No    Review of Systems Per HPI  Objective:   Vitals:   05/19/22 1111 05/19/22 1129  BP: (!) 140/56 124/60  Pulse: 67   Temp: 97.7 F (36.5 C)   TempSrc: Temporal   SpO2: 96%   Weight: 196 lb 9.6 oz (89.2 kg)   Height: '5\' 7"'$  (1.702 m)      Physical Exam Vitals reviewed.  Constitutional:      Appearance: He is well-developed.  HENT:     Head: Normocephalic and atraumatic.  Neck:     Vascular: No carotid bruit or JVD.  Cardiovascular:     Rate and Rhythm: Normal rate and regular rhythm.     Heart sounds: Murmur (2-3 SEM.) heard.  Pulmonary:     Effort: Pulmonary effort is normal.     Breath sounds: Normal breath sounds. No rales.  Abdominal:     General: Bowel sounds are normal. There is no distension.     Tenderness: There is no abdominal tenderness. There is no guarding.  Musculoskeletal:     Right lower leg: No edema.     Left lower leg: No edema.  Skin:    General: Skin is warm and dry.  Neurological:     Mental Status: He is alert and oriented to person, place, and time.  Psychiatric:        Mood and Affect: Mood normal.    Assessment & Plan:  Jonathan Gilmore is a 72 y.o. male . Anemia, unspecified type  AVM (arteriovenous malformation) of small bowel, acquired  Aortic valve stenosis, etiology of cardiac valve disease unspecified  Anemia improved with most recent hemoglobin, denies new fatigue.  Dark stools with iron supplementation  but no new or worsening symptoms.  Recent capsule endoscopy with reported AVMs, likely cause of bleeding.  Appears that gastroenterology did try to reach him about further procedure, unable to reach.  Asked that he call them to schedule/discuss next procedure.  Additionally message pending to his cardiologist but he plans to contact him as well  or can have gastroenterology discuss concerns with his aortic stenosis as a possible contributor.  If he is unable to get in touch with his specialists, advised to let me know and I can assist if needed.  Recheck 3 months to review meds.  Declined repeat blood work today.  No orders of the defined types were placed in this encounter.  Patient Instructions  Call [facility] Gastroenterology to discuss next procedure for the areas found on the capsule endoscopy.   Let me know if you do not hear from Dr. Harl Bowie, but I would concern the aortic stenosis question with him or have your gastroenterologist discuss with him. Again let me know if I can help.   Return to the clinic or go to the nearest emergency room if any of your symptoms worsen or new symptoms occur.     Signed,   Merri Ray, MD [place] 05/19/22 1:33 PM

## 2022-05-19 NOTE — Telephone Encounter (Signed)
Will call with pre op time.

## 2022-05-19 NOTE — Patient Instructions (Addendum)
Call [facility] Gastroenterology to discuss next procedure for the areas found on the capsule endoscopy.   Let me know if you do not hear from Dr. Harl Bowie, but I would concern the aortic stenosis question with him or have your gastroenterologist discuss with him. Again let me know if I can help.   Return to the clinic or go to the nearest emergency room if any of your symptoms worsen or new symptoms occur.

## 2022-06-01 ENCOUNTER — Telehealth: Payer: Self-pay | Admitting: Family Medicine

## 2022-06-01 DIAGNOSIS — I350 Nonrheumatic aortic (valve) stenosis: Secondary | ICD-10-CM

## 2022-06-01 DIAGNOSIS — I5031 Acute diastolic (congestive) heart failure: Secondary | ICD-10-CM

## 2022-06-01 NOTE — Telephone Encounter (Signed)
Caller name: ITSUKI RIPOLL  On DPR?: Yes  Call back number: [phone] (mobile)  Provider they see: Wendie Agreste, MD  Reason for call: Patient called stating that Dr.Greene has message Dr. Harl Bowie to contact him. Patient wanted to let Dr.Greene know he hasn't heard anything from [place].

## 2022-06-02 NOTE — Telephone Encounter (Signed)
Looks like Dr Harl Bowie is a cardiologist but the referral for vascular was placed November of 2023 will have to call their office and ask if there is more information needed.

## 2022-06-02 NOTE — Telephone Encounter (Signed)
Pt was seen for vascular not sure when message was sent to Dr Harl Bowie recently do not see this in chart, please advise?

## 2022-06-03 NOTE — Telephone Encounter (Signed)
Noted. I will forward to Dr. Harl Bowie. It looks like patient sent mychart message to Dr. Harl Bowie prior regarding question posed by gastroenterology.

## 2022-06-04 ENCOUNTER — Encounter (HOSPITAL_COMMUNITY)
Admission: RE | Admit: 2022-06-04 | Discharge: 2022-06-04 | Disposition: A | Discharge status: Home / Self Care | Payer: Medicare Other | Source: Ambulatory Visit | Attending: Gastroenterology | Admitting: Gastroenterology

## 2022-06-04 NOTE — Telephone Encounter (Signed)
Pt notified of Dr. Nelly Laurence response and agrees to have labwork done

## 2022-06-04 NOTE — Addendum Note (Signed)
Addended by: Levonne Hubert on: 06/04/2022 12:08 PM   Modules accepted: Orders

## 2022-06-06 ENCOUNTER — Other Ambulatory Visit: Payer: Self-pay | Admitting: Family Medicine

## 2022-06-06 DIAGNOSIS — M5116 Intervertebral disc disorders with radiculopathy, lumbar region: Secondary | ICD-10-CM

## 2022-06-07 ENCOUNTER — Other Ambulatory Visit: Payer: Self-pay | Admitting: *Deleted

## 2022-06-07 ENCOUNTER — Ambulatory Visit (HOSPITAL_COMMUNITY): Payer: Medicare Other | Admitting: Anesthesiology

## 2022-06-07 ENCOUNTER — Other Ambulatory Visit (HOSPITAL_COMMUNITY)
Admission: RE | Admit: 2022-06-07 | Discharge: 2022-06-07 | Disposition: A | Discharge status: Home / Self Care | Payer: Medicare Other | Source: Ambulatory Visit | Attending: Cardiology | Admitting: Cardiology

## 2022-06-07 DIAGNOSIS — I350 Nonrheumatic aortic (valve) stenosis: Secondary | ICD-10-CM

## 2022-06-07 LAB — PLATELET FUNCTION ASSAY
Collagen / ADP: 132 seconds — ABNORMAL HIGH (ref 0–118)
Collagen / Epinephrine: 292 seconds — ABNORMAL HIGH (ref 0–193)

## 2022-06-08 ENCOUNTER — Encounter (HOSPITAL_COMMUNITY): Payer: Self-pay | Admitting: *Deleted

## 2022-06-08 ENCOUNTER — Encounter (HOSPITAL_COMMUNITY): Admission: RE | Disposition: A | Discharge status: Home / Self Care | Payer: Self-pay | Source: Ambulatory Visit

## 2022-06-08 ENCOUNTER — Encounter (HOSPITAL_COMMUNITY): Payer: Self-pay | Admitting: Gastroenterology

## 2022-06-08 ENCOUNTER — Ambulatory Visit (HOSPITAL_COMMUNITY)
Admission: RE | Admit: 2022-06-08 | Discharge: 2022-06-08 | Disposition: A | Discharge status: Home / Self Care | Payer: Medicare Other | Source: Ambulatory Visit | Attending: Gastroenterology | Admitting: Gastroenterology

## 2022-06-08 ENCOUNTER — Other Ambulatory Visit: Payer: Self-pay

## 2022-06-08 ENCOUNTER — Emergency Department (HOSPITAL_COMMUNITY)
Admission: EM | Admit: 2022-06-08 | Discharge: 2022-06-08 | Discharge status: Against Medical Advice | Payer: Medicare Other | Source: Home / Self Care

## 2022-06-08 DIAGNOSIS — I4891 Unspecified atrial fibrillation: Secondary | ICD-10-CM | POA: Insufficient documentation

## 2022-06-08 DIAGNOSIS — K5520 Angiodysplasia of colon without hemorrhage: Secondary | ICD-10-CM | POA: Insufficient documentation

## 2022-06-08 DIAGNOSIS — D509 Iron deficiency anemia, unspecified: Secondary | ICD-10-CM | POA: Diagnosis not present

## 2022-06-08 DIAGNOSIS — Z5321 Procedure and treatment not carried out due to patient leaving prior to being seen by health care provider: Secondary | ICD-10-CM | POA: Insufficient documentation

## 2022-06-08 DIAGNOSIS — Z538 Procedure and treatment not carried out for other reasons: Secondary | ICD-10-CM | POA: Insufficient documentation

## 2022-06-08 DIAGNOSIS — D500 Iron deficiency anemia secondary to blood loss (chronic): Secondary | ICD-10-CM

## 2022-06-08 DIAGNOSIS — Q2730 Arteriovenous malformation, site unspecified: Secondary | ICD-10-CM

## 2022-06-08 LAB — GLUCOSE, CAPILLARY: Glucose-Capillary: 185 mg/dL — ABNORMAL HIGH (ref 70–99)

## 2022-06-08 SURGERY — ENTEROSCOPY
Anesthesia: Monitor Anesthesia Care

## 2022-06-08 MED ORDER — LACTATED RINGERS IV SOLN: INTRAVENOUS | Status: DC | PRN

## 2022-06-08 NOTE — Final Progress Note (Signed)
Upon assessment, pt. appears to be in new onset afib, other VSS.  Dr. Charna Elizabeth in to assess & 12 lead EKG was obtained.  Afib was confirmed.  Pt. wife was notified.  Pt. was taken to the ED triage via [place]. Report given to [place], [place].

## 2022-06-08 NOTE — ED Notes (Signed)
Pt sent from Short Stay d/t new onset A Fib.  Pt denies complaints.

## 2022-06-08 NOTE — Anesthesia Preprocedure Evaluation (Deleted)
Anesthesia Evaluation  Patient identified by MRN, date of birth, ID band Patient awake    Reviewed: Allergy & Precautions, H&P , NPO status , Patient's Chart, lab work & pertinent test results, reviewed documented beta blocker date and time   Airway Mallampati: III  TM Distance: >3 FB Neck ROM: Full   Comment: Cervical spondylosis with myelopathy Dental  (+) Dental Advisory Given, Missing, Poor Dentition   Pulmonary shortness of breath and with exertion, sleep apnea , Current Smoker and Patient abstained from smoking.   Pulmonary exam normal breath sounds clear to auscultation       Cardiovascular Exercise Tolerance: Good hypertension, Pt. on home beta blockers and Pt. on medications + CAD and +CHF  + Valvular Problems/Murmurs AS  Rhythm:Irregular Rate:Normal + Systolic murmurs 1. Left ventricular ejection fraction, by estimation, is 55 to 60%. The  left ventricle has normal function. The left ventricle demonstrates  regional wall motion abnormalities (see scoring diagram/findings for  description). There is moderate left  ventricular hypertrophy. Left ventricular diastolic parameters are  consistent with Grade I diastolic dysfunction (impaired relaxation).   2. Right ventricular systolic function is normal. The right ventricular  size is normal.   3. Left atrial size was severely dilated.   4. Right atrial size was mildly dilated.   5. The mitral valve is normal in structure. Mild mitral valve  regurgitation. No evidence of mitral stenosis.   6. Prior AV mean gradient 17 mmHg. The aortic valve is calcified. There  is moderate calcification of the aortic valve. There is moderate  thickening of the aortic valve. Aortic valve regurgitation is not  visualized. Moderate aortic valve stenosis. Aortic   valve mean gradient measures 23.0 mmHg. Aortic valve Vmax measures 3.08  m/s.   7. The inferior vena cava is dilated in size with  <50% respiratory  variability, suggesting right atrial pressure of 15 mmHg.   8. Evidence of atrial level shunting detected by color flow Doppler.  There is a small patent foramen ovale with predominantly left to right  shunting across the atrial septum.      Neuro/Psych  PSYCHIATRIC DISORDERS Anxiety Depression     Neuromuscular disease    GI/Hepatic Neg liver ROS,GERD  Medicated and Controlled,,  Endo/Other  diabetes, Well Controlled, Type 2, Oral Hypoglycemic Agents    Renal/GU negative Renal ROS  negative genitourinary   Musculoskeletal  (+) Arthritis , Osteoarthritis,    Abdominal   Peds negative pediatric ROS (+)  Hematology  (+) Blood dyscrasia, anemia   Anesthesia Other Findings   Reproductive/Obstetrics negative OB ROS                             Anesthesia Physical Anesthesia Plan  ASA: 3  Anesthesia Plan: General   Post-op Pain Management: Minimal or no pain anticipated   Induction: Intravenous  PONV Risk Score and Plan: Propofol infusion  Airway Management Planned: Nasal Cannula and Natural Airway  Additional Equipment:   Intra-op Plan:   Post-operative Plan:   Informed Consent: I have reviewed the patients History and Physical, chart, labs and discussed the procedure including the risks, benefits and alternatives for the proposed anesthesia with the patient or authorized representative who has indicated his/her understanding and acceptance.     Dental advisory given  Plan Discussed with: CRNA and Surgeon  Anesthesia Plan Comments:        Anesthesia Quick Evaluation

## 2022-06-08 NOTE — Progress Notes (Signed)
Patient found to have new onset afib, will be rescheduled per anesthesia recs/protocol. Patient will be taken to the ER for further evaluation.

## 2022-06-08 NOTE — ED Triage Notes (Signed)
Pt sent from short stay for new onset afib, pt denies any symptoms. Pt takes a baby ASA daily none today.

## 2022-06-08 NOTE — H&P (View-Only) (Signed)
Jonathan Gilmore is an 71 y.o. male.   Chief Complaint: iron def anemia and small bowel AVMs. HPI: Jonathan Gilmore is a 71 y.o. male with Pmh anxiety, diabetes, moderate aortic stenosis, CHF. HTN, depression, CAD,  who presents for evaluation of iron deficiency anemia and small bowel AVMs.  Patient reports feeling fatigued intermittently.  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 The patient denies having any nausea, vomiting, fever, chills, hematochezia, melena, hematemesis, abdominal distention, abdominal pain, diarrhea, jaundice, pruritus or weight loss.   Past Medical History:  Diagnosis Date   Abnormal cardiovascular stress test 03/2013   Anxiety    Arthritis    Back pain    CHF (congestive heart failure) (HCC)    Coronary artery disease    Multivessel with significant LAD involvement by chest CT 2014   Depression    Diastolic dysfunction    Essential hypertension    Lumbar herniated disc    L4-L5   MRSA (methicillin resistant staph aureus) culture positive    Neuropathy    Shortness of breath dyspnea    Sleep apnea    No cpap   Type 2 diabetes mellitus (HCC)     Past Surgical History:  Procedure Laterality Date   ANTERIOR CERVICAL DECOMP/DISCECTOMY FUSION  01/18/2011   Procedure: ANTERIOR CERVICAL DECOMPRESSION/DISCECTOMY FUSION 2 LEVELS;  Surgeon: Henry A Pool;  [facility]: [facility];  Service: Neurosurgery;  Laterality: N/A;  anterior cervical discectomy and fusion with allograft and plating, cervical five-six, cervical six-seven   BIOPSY N/A 04/04/2015   Procedure: BIOPSY;  Surgeon: Najeeb U Rehman, MD;  [facility];  Service: Endoscopy;  Laterality: N/A;   BIOPSY  11/09/2019   Procedure: BIOPSY;  Surgeon: Castaneda Mayorga, Jonuel Butterfield, MD;  [facility];  Service: Gastroenterology;;   Bone spur removed  1997   Left shoulder   CARDIAC CATHETERIZATION N/A 08/08/2015   Procedure: Right/Left Heart Cath and Coronary Angiography;   Surgeon: Michael Cooper, MD;  [facility];  Service: Cardiovascular;  Laterality: N/A;   CARPAL TUNNEL RELEASE Right    COLONOSCOPY WITH PROPOFOL N/A 11/09/2019   Procedure: COLONOSCOPY WITH PROPOFOL;  Surgeon: Castaneda Mayorga, Daemon Dowty, MD;  [facility];  Service: Gastroenterology;  Laterality: N/A;  900   COLONOSCOPY WITH PROPOFOL N/A 02/27/2021   Procedure: COLONOSCOPY WITH PROPOFOL;  Surgeon: Castaneda Mayorga, Iqra Rotundo, MD;  [facility];  Service: Gastroenterology;  Laterality: N/A;  7:30   COLONOSCOPY WITH PROPOFOL N/A 04/05/2022   Procedure: COLONOSCOPY WITH PROPOFOL;  Surgeon: Nandigam, Kavitha V, MD;  [facility];  Service: Gastroenterology;  Laterality: N/A;   ESOPHAGEAL DILATION N/A 04/04/2015   Procedure: ESOPHAGEAL DILATION;  Surgeon: Najeeb U Rehman, MD;  [facility];  Service: Endoscopy;  Laterality: N/A;   ESOPHAGEAL DILATION N/A 11/09/2019   Procedure: ESOPHAGEAL DILATION;  Surgeon: Castaneda Mayorga, Jacie Tristan, MD;  [facility];  Service: Gastroenterology;  Laterality: N/A;   ESOPHAGOGASTRODUODENOSCOPY N/A 04/04/2015   Procedure: ESOPHAGOGASTRODUODENOSCOPY (EGD);  Surgeon: Najeeb U Rehman, MD;  [facility];  Service: Endoscopy;  Laterality: N/A;  1240   ESOPHAGOGASTRODUODENOSCOPY (EGD) WITH PROPOFOL N/A 11/09/2019   Procedure: ESOPHAGOGASTRODUODENOSCOPY (EGD) WITH PROPOFOL;  Surgeon: Castaneda Mayorga, Jakory Matsuo, MD;  [facility];  Service: Gastroenterology;  Laterality: N/A;   ESOPHAGOGASTRODUODENOSCOPY (EGD) WITH PROPOFOL N/A 04/04/2022   Procedure: ESOPHAGOGASTRODUODENOSCOPY (EGD) WITH PROPOFOL;  Surgeon: Armbruster, Steven P, MD;  [facility];  Service: Gastroenterology;    Laterality: N/A;   GIVENS CAPSULE STUDY N/A 05/11/2022   Procedure: GIVENS CAPSULE STUDY;  Surgeon: Castaneda Mayorga, Kayshawn Ozburn, MD;  [facility];  Service: Gastroenterology;  Laterality: N/A;  7:30 am    HEMORRHOID SURGERY     POLYPECTOMY  11/09/2019   Procedure: POLYPECTOMY;  Surgeon: Castaneda Mayorga, Jadia Capers, MD;  [facility];  Service: Gastroenterology;;   POLYPECTOMY  02/27/2021   Procedure: POLYPECTOMY;  Surgeon: Castaneda Mayorga, Kataleah Bejar, MD;  [facility];  Service: Gastroenterology;;   POLYPECTOMY  04/05/2022   Procedure: POLYPECTOMY;  Surgeon: Nandigam, Kavitha V, MD;  [facility];  Service: Gastroenterology;;   TONSILLECTOMY      Family History  Problem Relation Age of Onset   Lung cancer Mother    Diabetes Father    Hepatitis B Father    Diabetes Sister    Cushing syndrome Sister    Social History:  reports that he has been smoking cigarettes. He started smoking about 54 years ago. He has a 50.00 pack-year smoking history. He has quit using smokeless tobacco. He reports current alcohol use of about 1.0 standard drink of alcohol per week. He reports current drug use. Frequency: 1.00 time per week. Drug: Marijuana.  Allergies:  Allergies  Allergen Reactions   Morphine And Related Nausea And Vomiting    Medications Prior to Admission  Medication Sig Dispense Refill   ACCU-CHEK AVIVA PLUS test strip USE AS INSTRUCTED 100 strip 3   aspirin EC 81 MG tablet Take 1 tablet (81 mg total) by mouth daily. 30 tablet 11   atorvastatin (LIPITOR) 80 MG tablet TAKE 1 TABLET BY MOUTH DAILY 90 tablet 3   Calcium-Magnesium-Zinc (CAL-MAG-ZINC PO) Take 1 tablet by mouth daily.     ferrous sulfate 325 (65 FE) MG tablet Take 1 tablet (325 mg total) by mouth daily with breakfast. 90 tablet 3   folic acid (FOLVITE) 1 MG tablet Take 1 tablet (1 mg total) by mouth daily. 30 tablet 0   furosemide (LASIX) 40 MG tablet Take 1 tablet (40 mg total) by mouth as needed for edema (swelling).     gabapentin (NEURONTIN) 300 MG capsule Take 1-2 capsules (300-600 mg total) by mouth 2 (two) times daily. (Patient taking differently: Take 900-1,200 mg by mouth at bedtime.) 270  capsule 3   lisinopril (ZESTRIL) 20 MG tablet Take 1 tablet (20 mg total) by mouth daily. 90 tablet 3   magnesium oxide (MAG-OX) 400 MG tablet Take 400 mg by mouth at bedtime.     metFORMIN (GLUCOPHAGE) 500 MG tablet TAKE 2 TABLETS BY MOUTH TWICE  DAILY 360 tablet 3   metoprolol succinate (TOPROL-XL) 25 MG 24 hr tablet TAKE 1 TABLET BY MOUTH DAILY AT  BEDTIME 90 tablet 3   Multiple Vitamin (MULTIVITAMIN WITH MINERALS) TABS Take 1 tablet by mouth daily.     nitroGLYCERIN (NITROSTAT) 0.4 MG SL tablet Place 1 tablet (0.4 mg total) under the tongue every 5 (five) minutes x 3 doses as needed for chest pain (if no relief after 2nd dose, proceed to ED for an evaluation). 25 tablet 3   pantoprazole (PROTONIX) 40 MG tablet Take 1 tablet (40 mg total) by mouth daily. 90 tablet 3    Results for orders placed or performed during the hospital encounter of 06/07/22 (from the past 48 hour(s))  Platelet function assay     Status: Abnormal   Collection Time: 06/07/22  2:26 PM  Result Value Ref Range   Collagen /   ADP 132 (H) 0 - 118 seconds   PFA Interpretation            Comment: Platelet function is abnormal. This pattern is most commonly seen in patients with von Willebrand disease or congenital platelet defects.  Prolongation of the Col/ADP closure time generally indicate a more extensive qualitative platelet defect and/or abnormality in von Willebrand factor.        Results of the test should always be interpreted in conjunction with the patient's medical history, clinical presentation and medication history. Patients with Hematocrit values <35.0% or Platelet counts <150,000/uL may result in values above the Laboratory established reference range.    Collagen / Epinephrine 292 (H) 0 - 193 seconds    Comment: Performed at [facility], [address] [place], [place] [address]   No results found.  Review of Systems  Constitutional:  Positive for fatigue.  All other systems reviewed and are  negative.   Blood pressure 137/70, pulse (!) 103, resp. rate 18, SpO2 98 %. Physical Exam  GENERAL: The patient is AO x3, in no acute distress. HEENT: Head is normocephalic and atraumatic. EOMI are intact. Mouth is well hydrated and without lesions. NECK: Supple. No masses LUNGS: Clear to auscultation. No presence of rhonchi/wheezing/rales. Adequate chest expansion HEART: RRR, normal s1 and s2. ABDOMEN: Soft, nontender, no guarding, no peritoneal signs, and nondistended. BS +. No masses. EXTREMITIES: Without any cyanosis, clubbing, rash, lesions or edema. NEUROLOGIC: AOx3, no focal motor deficit. SKIN: no jaundice, no rashes  Assessment/Plan Jonathan Gilmore is a 71 y.o. male with Pmh anxiety, diabetes, moderate aortic stenosis, CHF. HTN, depression, CAD,  who presents for evaluation of iron deficiency anemia and small bowel AVMs.  Will proceed with push enteroscopy.  Toshio Slusher Castaneda Mayorga, MD 06/08/2022, 12:41 PM    

## 2022-06-08 NOTE — H&P (Signed)
Jonathan Gilmore is an 72 y.o. male.   Chief Complaint: iron def anemia and small bowel AVMs. HPI: Jonathan Gilmore is a 72 y.o. male with Pmh anxiety, diabetes, moderate aortic stenosis, CHF. HTN, depression, CAD,  who presents for evaluation of iron deficiency anemia and small bowel AVMs.  Patient reports feeling fatigued intermittently.  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 The patient denies having any nausea, vomiting, fever, chills, hematochezia, melena, hematemesis, abdominal distention, abdominal pain, diarrhea, jaundice, pruritus or weight loss.   Past Medical History:  Diagnosis Date   Abnormal cardiovascular stress test 03/2013   Anxiety    Arthritis    Back pain    CHF (congestive heart failure) (HCC)    Coronary artery disease    Multivessel with significant LAD involvement by chest CT 2014   Depression    Diastolic dysfunction    Essential hypertension    Lumbar herniated disc    L4-L5   MRSA (methicillin resistant staph aureus) culture positive    Neuropathy    Shortness of breath dyspnea    Sleep apnea    No cpap   Type 2 diabetes mellitus [facility])     Past Surgical History:  Procedure Laterality Date   ANTERIOR CERVICAL DECOMP/DISCECTOMY FUSION  01/18/2011   Procedure: ANTERIOR CERVICAL DECOMPRESSION/DISCECTOMY FUSION 2 LEVELS;  Surgeon: Cooper Render Pool;  Location: [place] [facility];  Service: Neurosurgery;  Laterality: N/A;  anterior cervical discectomy and fusion with allograft and plating, cervical five-six, cervical six-seven   BIOPSY N/A 04/04/2015   Procedure: BIOPSY;  Surgeon: Rogene Houston, MD;  [facility];  Service: Endoscopy;  Laterality: N/A;   BIOPSY  11/09/2019   Procedure: BIOPSY;  Surgeon: Harvel Quale, MD;  [facility];  Service: Gastroenterology;;   Bone spur removed  1997   Left shoulder   CARDIAC CATHETERIZATION N/A 08/08/2015   Procedure: Right/Left Heart Cath and Coronary Angiography;   Surgeon: Sherren Mocha, MD;  Location: [facility];  Service: Cardiovascular;  Laterality: N/A;   CARPAL TUNNEL RELEASE Right    COLONOSCOPY WITH PROPOFOL N/A 11/09/2019   Procedure: COLONOSCOPY WITH PROPOFOL;  Surgeon: Harvel Quale, MD;  [facility];  Service: Gastroenterology;  Laterality: N/A;  900   COLONOSCOPY WITH PROPOFOL N/A 02/27/2021   Procedure: COLONOSCOPY WITH PROPOFOL;  Surgeon: Harvel Quale, MD;  [facility];  Service: Gastroenterology;  Laterality: N/A;  7:30   COLONOSCOPY WITH PROPOFOL N/A 04/05/2022   Procedure: COLONOSCOPY WITH PROPOFOL;  Surgeon: Mauri Pole, MD;  Location: [place];  Service: Gastroenterology;  Laterality: N/A;   ESOPHAGEAL DILATION N/A 04/04/2015   Procedure: ESOPHAGEAL DILATION;  Surgeon: Rogene Houston, MD;  [facility];  Service: Endoscopy;  Laterality: N/A;   ESOPHAGEAL DILATION N/A 11/09/2019   Procedure: ESOPHAGEAL DILATION;  Surgeon: Harvel Quale, MD;  [facility];  Service: Gastroenterology;  Laterality: N/A;   ESOPHAGOGASTRODUODENOSCOPY N/A 04/04/2015   Procedure: ESOPHAGOGASTRODUODENOSCOPY (EGD);  Surgeon: Rogene Houston, MD;  [facility];  Service: Endoscopy;  Laterality: N/A;  1240   ESOPHAGOGASTRODUODENOSCOPY (EGD) WITH PROPOFOL N/A 11/09/2019   Procedure: ESOPHAGOGASTRODUODENOSCOPY (EGD) WITH PROPOFOL;  Surgeon: Harvel Quale, MD;  [facility];  Service: Gastroenterology;  Laterality: N/A;   ESOPHAGOGASTRODUODENOSCOPY (EGD) WITH PROPOFOL N/A 04/04/2022   Procedure: ESOPHAGOGASTRODUODENOSCOPY (EGD) WITH PROPOFOL;  Surgeon: Yetta Flock, MD;  Location: [place];  Service: Gastroenterology;  Laterality: N/A;   GIVENS CAPSULE STUDY N/A 05/11/2022   Procedure: GIVENS CAPSULE STUDY;  Surgeon: Harvel Quale, MD;  [facility];  Service: Gastroenterology;  Laterality: N/A;  7:30 am    HEMORRHOID SURGERY     POLYPECTOMY  11/09/2019   Procedure: POLYPECTOMY;  Surgeon: Harvel Quale, MD;  [facility];  Service: Gastroenterology;;   POLYPECTOMY  02/27/2021   Procedure: POLYPECTOMY;  Surgeon: Harvel Quale, MD;  [facility];  Service: Gastroenterology;;   POLYPECTOMY  04/05/2022   Procedure: POLYPECTOMY;  Surgeon: Mauri Pole, MD;  Location: [facility];  Service: Gastroenterology;;   TONSILLECTOMY      Family History  Problem Relation Age of Onset   Lung cancer Mother    Diabetes Father    Hepatitis B Father    Diabetes Sister    Cushing syndrome Sister    Social History:  reports that he has been smoking cigarettes. He started smoking about 54 years ago. He has a 50.00 pack-year smoking history. He has quit using smokeless tobacco. He reports current alcohol use of about 1.0 standard drink of alcohol per week. He reports current drug use. Frequency: 1.00 time per week. Drug: Marijuana.  Allergies:  Allergies  Allergen Reactions   Morphine And Related Nausea And Vomiting    Medications Prior to Admission  Medication Sig Dispense Refill   ACCU-CHEK AVIVA PLUS test strip USE AS INSTRUCTED 100 strip 3   aspirin EC 81 MG tablet Take 1 tablet (81 mg total) by mouth daily. 30 tablet 11   atorvastatin (LIPITOR) 80 MG tablet TAKE 1 TABLET BY MOUTH DAILY 90 tablet 3   Calcium-Magnesium-Zinc (CAL-MAG-ZINC PO) Take 1 tablet by mouth daily.     ferrous sulfate 325 (65 FE) MG tablet Take 1 tablet (325 mg total) by mouth daily with breakfast. 90 tablet 3   folic acid (FOLVITE) 1 MG tablet Take 1 tablet (1 mg total) by mouth daily. 30 tablet 0   furosemide (LASIX) 40 MG tablet Take 1 tablet (40 mg total) by mouth as needed for edema (swelling).     gabapentin (NEURONTIN) 300 MG capsule Take 1-2 capsules (300-600 mg total) by mouth 2 (two) times daily. (Patient taking differently: Take 900-1,200 mg by mouth at bedtime.) 270  capsule 3   lisinopril (ZESTRIL) 20 MG tablet Take 1 tablet (20 mg total) by mouth daily. 90 tablet 3   magnesium oxide (MAG-OX) 400 MG tablet Take 400 mg by mouth at bedtime.     metFORMIN (GLUCOPHAGE) 500 MG tablet TAKE 2 TABLETS BY MOUTH TWICE  DAILY 360 tablet 3   metoprolol succinate (TOPROL-XL) 25 MG 24 hr tablet TAKE 1 TABLET BY MOUTH DAILY AT  BEDTIME 90 tablet 3   Multiple Vitamin (MULTIVITAMIN WITH MINERALS) TABS Take 1 tablet by mouth daily.     nitroGLYCERIN (NITROSTAT) 0.4 MG SL tablet Place 1 tablet (0.4 mg total) under the tongue every 5 (five) minutes x 3 doses as needed for chest pain (if no relief after 2nd dose, proceed to ED for an evaluation). 25 tablet 3   pantoprazole (PROTONIX) 40 MG tablet Take 1 tablet (40 mg total) by mouth daily. 90 tablet 3    Results for orders placed or performed during the hospital encounter of 06/07/22 (from the past 48 hour(s))  Platelet function assay     Status: Abnormal   Collection Time: 06/07/22  2:26 PM  Result Value Ref Range   Collagen /  ADP 132 (H) 0 - 118 seconds   PFA Interpretation            Comment: Platelet function is abnormal. This pattern is most commonly seen in patients with von Willebrand disease or congenital platelet defects.  Prolongation of the Col/ADP closure time generally indicate a more extensive qualitative platelet defect and/or abnormality in von Willebrand factor.        Results of the test should always be interpreted in conjunction with the patient's medical history, clinical presentation and medication history. Patients with Hematocrit values <35.0% or Platelet counts <150,000/uL may result in values above the Laboratory established reference range.    Collagen / Epinephrine 292 (H) 0 - 193 seconds    Comment: Performed at [facility], [address], [place], [place] [address]   No results found.  Review of Systems  Constitutional:  Positive for fatigue.  All other systems reviewed and are  negative.   Blood pressure 137/70, pulse (!) 103, resp. rate 18, SpO2 98 %. Physical Exam  GENERAL: The patient is AO x3, in no acute distress. HEENT: Head is normocephalic and atraumatic. EOMI are intact. Mouth is well hydrated and without lesions. NECK: Supple. No masses LUNGS: Clear to auscultation. No presence of rhonchi/wheezing/rales. Adequate chest expansion HEART: RRR, normal s1 and s2. ABDOMEN: Soft, nontender, no guarding, no peritoneal signs, and nondistended. BS +. No masses. EXTREMITIES: Without any cyanosis, clubbing, rash, lesions or edema. NEUROLOGIC: AOx3, no focal motor deficit. SKIN: no jaundice, no rashes  Assessment/Plan Jonathan Gilmore is a 72 y.o. male with Pmh anxiety, diabetes, moderate aortic stenosis, CHF. HTN, depression, CAD,  who presents for evaluation of iron deficiency anemia and small bowel AVMs.  Will proceed with push enteroscopy.  Harvel Quale, MD 06/08/2022, 12:41 PM

## 2022-06-08 NOTE — ED Notes (Signed)
Pt came to this Probation officer and reported that he wants to leave.  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   Pt encouraged to to stay, but continues to deny complaints and wants to leave.

## 2022-06-09 ENCOUNTER — Ambulatory Visit: Payer: Medicare Other | Attending: Student | Admitting: Student

## 2022-06-09 ENCOUNTER — Encounter: Payer: Self-pay | Admitting: Student

## 2022-06-09 VITALS — BP 142/68 | HR 88 | Ht 67.0 in | Wt 194.0 lb

## 2022-06-09 DIAGNOSIS — I10 Essential (primary) hypertension: Secondary | ICD-10-CM

## 2022-06-09 DIAGNOSIS — E782 Mixed hyperlipidemia: Secondary | ICD-10-CM

## 2022-06-09 DIAGNOSIS — I350 Nonrheumatic aortic (valve) stenosis: Secondary | ICD-10-CM | POA: Diagnosis not present

## 2022-06-09 DIAGNOSIS — I2510 Atherosclerotic heart disease of native coronary artery without angina pectoris: Secondary | ICD-10-CM

## 2022-06-09 DIAGNOSIS — D649 Anemia, unspecified: Secondary | ICD-10-CM

## 2022-06-09 DIAGNOSIS — I480 Paroxysmal atrial fibrillation: Secondary | ICD-10-CM

## 2022-06-09 DIAGNOSIS — G4730 Sleep apnea, unspecified: Secondary | ICD-10-CM

## 2022-06-09 DIAGNOSIS — D691 Qualitative platelet defects: Secondary | ICD-10-CM

## 2022-06-09 MED ORDER — METOPROLOL SUCCINATE ER 50 MG PO TB24: 50.0000 mg | ORAL_TABLET | Freq: Every day | ORAL | 3 refills | Status: DC

## 2022-06-09 NOTE — Patient Instructions (Signed)
Medication Instructions:  Your physician has recommended you make the following change in your medication:   Increase Toprol XL to 50 mg Daily   *If you need a refill on your cardiac medications before your next appointment, please call your pharmacy*   Lab Work: Your physician recommends that you return for lab work in: Tomorrow   If you have labs (blood work) drawn today and your tests are completely normal, you will receive your results only by: [employer] (if you have MyChart) OR A paper copy in the mail If you have any lab test that is abnormal or we need to change your treatment, we will call you to review the results.   Testing/Procedures: NONE    Follow-Up: At [facility], you and your health needs are our priority.  As part of our continuing mission to provide you with exceptional heart care, we have created designated Provider Care Teams.  These Care Teams include your primary Cardiologist (physician) and Advanced Practice Providers (APPs -  Physician Assistants and Nurse Practitioners) who all work together to provide you with the care you need, when you need it.  We recommend signing up for the patient portal called "MyChart".  Sign up information is provided on this After Visit Summary.  MyChart is used to connect with patients for Virtual Visits (Telemedicine).  Patients are able to view lab/test results, encounter notes, upcoming appointments, etc.  Non-urgent messages can be sent to your provider as well.   To learn more about what you can do with MyChart, go to [email or web address].    Your next appointment:   3 month(s)  Provider:   Carlyle Dolly, MD    Other Instructions Thank you for choosing [facility]!

## 2022-06-09 NOTE — Progress Notes (Signed)
Cardiology Office Note:    Date:  06/09/2022  ID:  Jonathan Gilmore, DOB [birth year], MRN [id number]  History of Present Illness:    Jonathan Gilmore is a 72 y.o. male with past medical history of CAD (s/p cath in 07/2015 showed 50% LAD stenosis with overall patent coronary arteries, NST in 02/2021 showing small inferior infarct with no current ischemia), HTN, HLD, aortic stenosis, Type 2 DM and HFpEF who presents to the office today for evaluation of new-onset atrial fibrillation.  He was last examined by Finis Bud, NP in 03/2022 following a recent hospitalization for exertional chest pain in the setting of acute anemia with hemoglobin down to 7.8. He underwent EGD and colonoscopy and both were unremarkable. He denied any recurrent chest pain and was restarted on ASA along with being continued on Atorvastatin, Lisinopril and Metoprolol.  In the interim, he did undergo a capsule endoscopy by GI which showed AVM's and enteroscopy was recommended. Upon arrival for his procedure, an EKG was obtained and showed he was in new-onset atrial fibrillation. Therefore, his procedure was canceled he was taken to the ED for further evaluation. He did leave the ED prior to evaluation.  In talking with the patient today, he reports he was asymptomatic with his atrial fibrillation diagnosis yesterday and remains asymptomatic today. Denies any recent palpitations. No recent chest pain, dyspnea on exertion, orthopnea, PND or pitting edema. Unaware of any recent melena, hematochezia or hematuria.  Studies Reviewed:    EKG: EKG is ordered today and shows rate-controlled atrial fibrillation, heart rate 88 with LVH and associated ST abnormalities along the inferior and lateral leads.  R/LHC: 07/2015 Prox LAD to Mid LAD lesion, 50% stenosed.   1. Calcified moderate proximal-mid LAD stenosis 2. Widely patent, dominant LCx 3. Preserved cardiac output, mildly elevated right-sided intracardiac pressures   Recommend:  Medical therapy for nonobstructive CAD and CHF  Echo: 03/2022 IMPRESSIONS     1. Left ventricular ejection fraction, by estimation, is 55 to 60%. The  left ventricle has normal function. The left ventricle demonstrates  regional wall motion abnormalities (see scoring diagram/findings for  description). There is moderate left  ventricular hypertrophy. Left ventricular diastolic parameters are  consistent with Grade I diastolic dysfunction (impaired relaxation).   2. Right ventricular systolic function is normal. The right ventricular  size is normal.   3. Left atrial size was severely dilated.   4. Right atrial size was mildly dilated.   5. The mitral valve is normal in structure. Mild mitral valve  regurgitation. No evidence of mitral stenosis.   6. Prior AV mean gradient 17 mmHg. The aortic valve is calcified. There  is moderate calcification of the aortic valve. There is moderate  thickening of the aortic valve. Aortic valve regurgitation is not  visualized. Moderate aortic valve stenosis. Aortic   valve mean gradient measures 23.0 mmHg. Aortic valve Vmax measures 3.08  m/s.   7. The inferior vena cava is dilated in size with <50% respiratory  variability, suggesting right atrial pressure of 15 mmHg.   8. Evidence of atrial level shunting detected by color flow Doppler.  There is a small patent foramen ovale with predominantly left to right  shunting across the atrial septum.   Comparison(s): Prior images reviewed side by side.    Physical Exam:   VS:  BP (!) 142/68   Pulse 88   Ht 5\' 7"  (1.702 m)   Wt 194 lb (88 kg)   SpO2 98%  BMI 30.38 kg/m    Wt Readings from Last 3 Encounters:  06/09/22 194 lb (88 kg)  06/08/22 196 lb 3.4 oz (89 kg)  06/04/22 196 lb 9.6 oz (89.2 kg)     GEN: Pleasant male appearing in no acute distress NECK: No JVD; No carotid bruits CARDIAC: Irregular irregular. 2/6 SEM along RUSB.  RESPIRATORY:  Clear to auscultation without rales, wheezing  or rhonchi  ABDOMEN: Does not appear distended.  No obvious abdominal masses. EXTREMITIES:  No pitting edema; No deformity   ASSESSMENT AND PLAN:     1. New-Onset Atrial Fibrillation - This was a new diagnosis for the patient at the time of his procedure yesterday but he is overall asymptomatic with the arrhythmia and it is possible that he had episodes previously that were not captured. - While his rates are in the 80's to 90's at rest, will adjust Toprol-XL from 25 mg daily to 50 mg daily as I suspect his rates are increasing with activity. Will check labs to include CBC, BMET, TSH and magnesium. Will also arrange for a repeat sleep study.  - While his CHA2DS2-VASc Score is 4 (HTN, DM, Vascular, Age), he is not a candidate for anticoagulation at this time due to his recent GIB and AVM's. Therefore, not a candidate for DCCV. Reviewed with Dr. Harl Bowie as well who recommended to not start anticoagulation at this time. I will send a message to Dr. Jenetta Downer as the patient should be able to be rescheduled for his Enteroscopy following repeat labs. He had an echocardiogram in 03/2022 and this does not need to be repeated at this time.   2. CAD - Prior cath in 07/2015 showed 50% LAD stenosis with overall patent coronary arteries and NST in 02/2021 showed a small inferior infarct with no current ischemia. - He previously had chest pain in the setting of anemia but denies any recurrent symptoms.  Continue ASA 81 mg daily and Atorvastatin 80 mg daily. Will adjust Toprol-XL as outlined above.  3. Aortic Stenosis - This was moderate by echocardiogram in 03/2022 with mean gradient at 23 mmHg. In discussion with Dr. Harl Bowie, it was previously recommended he undergo workup for Heyde syndrome given his AS, AVM's and abnormal platelet function assay which is concerning for von Willebrand disease. Will check VWF Multimer Assay. If abnormal, would need to consider referral to Structural Heart for consideration of TAVR  vs. AVR.   4. HTN - His blood pressure was initially recorded at 160/80, rechecked and improved to 142/68.  Will plan to adjust Toprol-XL from 25 mg daily to 50 mg daily as outlined above. Continue Lisinopril 20 mg daily.  5. HLD - LDL was at 54 in 03/2022. Continue Atorvastatin 80mg  daily.  6. Sleep Apnea - He does report a diagnosis of sleep apnea and previously used a CPAP but quit using after a significant weight loss. Given his atrial fibrillation, will refer for a repeat sleep study.     Signed, Erma Heritage, PA-C

## 2022-06-10 ENCOUNTER — Other Ambulatory Visit (HOSPITAL_COMMUNITY)
Admission: RE | Admit: 2022-06-10 | Discharge: 2022-06-10 | Disposition: A | Discharge status: Home / Self Care | Payer: Medicare Other | Source: Ambulatory Visit | Attending: Cardiology | Admitting: Cardiology

## 2022-06-10 ENCOUNTER — Telehealth (INDEPENDENT_AMBULATORY_CARE_PROVIDER_SITE_OTHER): Payer: Self-pay | Admitting: Gastroenterology

## 2022-06-10 ENCOUNTER — Other Ambulatory Visit (HOSPITAL_COMMUNITY)
Admission: RE | Admit: 2022-06-10 | Discharge: 2022-06-10 | Disposition: A | Discharge status: Home / Self Care | Payer: Medicare Other | Source: Ambulatory Visit | Attending: Student | Admitting: Student

## 2022-06-10 DIAGNOSIS — D649 Anemia, unspecified: Secondary | ICD-10-CM | POA: Diagnosis present

## 2022-06-10 DIAGNOSIS — I350 Nonrheumatic aortic (valve) stenosis: Secondary | ICD-10-CM

## 2022-06-10 DIAGNOSIS — D691 Qualitative platelet defects: Secondary | ICD-10-CM | POA: Diagnosis present

## 2022-06-10 DIAGNOSIS — I480 Paroxysmal atrial fibrillation: Secondary | ICD-10-CM | POA: Diagnosis present

## 2022-06-10 LAB — CBC
HCT: 39.7 % (ref 39.0–52.0)
Hemoglobin: 11.9 g/dL — ABNORMAL LOW (ref 13.0–17.0)
MCH: 24.5 pg — ABNORMAL LOW (ref 26.0–34.0)
MCHC: 30 g/dL (ref 30.0–36.0)
MCV: 81.9 fL (ref 80.0–100.0)
Platelets: 271 10*3/uL (ref 150–400)
RBC: 4.85 MIL/uL (ref 4.22–5.81)
RDW: 19.9 % — ABNORMAL HIGH (ref 11.5–15.5)
WBC: 8.8 10*3/uL (ref 4.0–10.5)
nRBC: 0 % (ref 0.0–0.2)

## 2022-06-10 LAB — BASIC METABOLIC PANEL
Anion gap: 8 (ref 5–15)
BUN: 14 mg/dL (ref 8–23)
CO2: 24 mmol/L (ref 22–32)
Calcium: 8.3 mg/dL — ABNORMAL LOW (ref 8.9–10.3)
Chloride: 103 mmol/L (ref 98–111)
Creatinine, Ser: 0.7 mg/dL (ref 0.61–1.24)
GFR, Estimated: >60 mL/min (ref >60)
Glucose, Bld: 225 mg/dL — ABNORMAL HIGH (ref 70–99)
Potassium: 3.9 mmol/L (ref 3.5–5.1)
Sodium: 135 mmol/L (ref 135–145)

## 2022-06-10 LAB — TSH: TSH: 1.531 u[IU]/mL (ref 0.350–4.500)

## 2022-06-10 LAB — PLATELET FUNCTION ASSAY: Collagen / Epinephrine: 140 seconds (ref 0–193)

## 2022-06-10 LAB — MAGNESIUM: Magnesium: 1.8 mg/dL (ref 1.7–2.4)

## 2022-06-10 NOTE — Telephone Encounter (Signed)
Left message to return call 

## 2022-06-10 NOTE — Telephone Encounter (Signed)
Harvel Quale, MD  Erma Heritage, PA-C Cc: Vicente Males, LPN Thanks a lot [place], appreciate your input. Is he going to be anticoagulated? If so, please provide clearance to hold the anticoagulation (time frame will depend on the agent).  Lavella Lemons, please reschedule him for enteroscopy in room 3 Thanks  Ahmed Prima Fransisco Hertz, [place]  Harvel Quale, MD Cc: Vicente Males, LPN No, he will not be on anticoagulation at this time. Given his recent anemia and known AVM's, I reviewed it with Dr. Harl Bowie and he recommended holding off on anticoagulation for now.  [place], [place]

## 2022-06-14 NOTE — Telephone Encounter (Signed)
Contacted pt and EGD scheduled for 06/30/22. Instructions sent via my chart. Will contact pt with pre op appt. PA still good until 08/17/22

## 2022-06-16 ENCOUNTER — Encounter (INDEPENDENT_AMBULATORY_CARE_PROVIDER_SITE_OTHER): Payer: Self-pay

## 2022-06-16 NOTE — Telephone Encounter (Signed)
Pt contacted and made aware of pre op appt. Pre op scheduled for 06/28/22 at 12:45 pm in person at [facility]. Also sent my chart message with time/date.

## 2022-06-17 LAB — VON WILLEBRAND FACTOR MULTIMER

## 2022-06-20 ENCOUNTER — Other Ambulatory Visit: Payer: Self-pay | Admitting: Student

## 2022-06-20 DIAGNOSIS — D62 Acute posthemorrhagic anemia: Secondary | ICD-10-CM

## 2022-06-20 DIAGNOSIS — D691 Qualitative platelet defects: Secondary | ICD-10-CM

## 2022-06-20 NOTE — Progress Notes (Addendum)
Orders entered for upcoming labs.

## 2022-06-22 ENCOUNTER — Telehealth: Payer: Self-pay | Admitting: Cardiology

## 2022-06-22 ENCOUNTER — Other Ambulatory Visit (HOSPITAL_COMMUNITY)
Admission: RE | Admit: 2022-06-22 | Discharge: 2022-06-22 | Disposition: A | Discharge status: Home / Self Care | Payer: Medicare Other | Source: Ambulatory Visit | Attending: Student | Admitting: Student

## 2022-06-22 DIAGNOSIS — D62 Acute posthemorrhagic anemia: Secondary | ICD-10-CM | POA: Diagnosis present

## 2022-06-22 DIAGNOSIS — D691 Qualitative platelet defects: Secondary | ICD-10-CM | POA: Diagnosis present

## 2022-06-22 LAB — PROTIME-INR
INR: 1.1 (ref 0.8–1.2)
Prothrombin Time: 13.9 seconds (ref 11.4–15.2)

## 2022-06-22 LAB — APTT: aPTT: 30 seconds (ref 24–36)

## 2022-06-22 NOTE — Telephone Encounter (Signed)
Patient states that he was returning a call. Please advise

## 2022-06-22 NOTE — Telephone Encounter (Signed)
We had not called the patient,he was calling about oxycodone for back pain.He will call his pcp. He still plans to get additional lab work for his [employer]

## 2022-06-23 LAB — VON WILLEBRAND ANTIGEN: Von Willebrand Antigen, Plasma: 150 % (ref 50–200)

## 2022-06-23 LAB — FACTOR 8 ASSAY: Coagulation Factor VIII: 144 % — ABNORMAL HIGH (ref 56–140)

## 2022-06-23 LAB — FACTOR 8 RISTOCETIN COFACTOR: Ristocetin Co-factor, Plasma: 128 % (ref 50–200)

## 2022-06-28 ENCOUNTER — Encounter (HOSPITAL_COMMUNITY)
Admission: RE | Admit: 2022-06-28 | Discharge: 2022-06-28 | Disposition: A | Discharge status: Home / Self Care | Payer: Medicare Other | Source: Ambulatory Visit | Attending: Gastroenterology | Admitting: Gastroenterology

## 2022-06-28 NOTE — Progress Notes (Signed)
Dr Alva Garnet reviewed patient's history. No orders given.

## 2022-06-30 ENCOUNTER — Encounter (HOSPITAL_COMMUNITY)
Admission: RE | Disposition: A | Discharge status: Home / Self Care | Payer: Self-pay | Source: Home / Self Care | Attending: Gastroenterology

## 2022-06-30 ENCOUNTER — Ambulatory Visit (HOSPITAL_COMMUNITY): Payer: Medicare Other | Admitting: Anesthesiology

## 2022-06-30 ENCOUNTER — Ambulatory Visit (HOSPITAL_COMMUNITY)
Admission: RE | Admit: 2022-06-30 | Discharge: 2022-06-30 | Disposition: A | Discharge status: Home / Self Care | Payer: Medicare Other | Attending: Gastroenterology | Admitting: Gastroenterology

## 2022-06-30 ENCOUNTER — Encounter (HOSPITAL_COMMUNITY): Payer: Self-pay | Admitting: Gastroenterology

## 2022-06-30 ENCOUNTER — Other Ambulatory Visit: Payer: Self-pay

## 2022-06-30 ENCOUNTER — Ambulatory Visit (HOSPITAL_BASED_OUTPATIENT_CLINIC_OR_DEPARTMENT_OTHER): Payer: Medicare Other | Admitting: Anesthesiology

## 2022-06-30 DIAGNOSIS — I509 Heart failure, unspecified: Secondary | ICD-10-CM

## 2022-06-30 DIAGNOSIS — I252 Old myocardial infarction: Secondary | ICD-10-CM | POA: Insufficient documentation

## 2022-06-30 DIAGNOSIS — K31819 Angiodysplasia of stomach and duodenum without bleeding: Secondary | ICD-10-CM | POA: Diagnosis not present

## 2022-06-30 DIAGNOSIS — K3189 Other diseases of stomach and duodenum: Secondary | ICD-10-CM | POA: Insufficient documentation

## 2022-06-30 DIAGNOSIS — F17210 Nicotine dependence, cigarettes, uncomplicated: Secondary | ICD-10-CM | POA: Diagnosis not present

## 2022-06-30 DIAGNOSIS — I110 Hypertensive heart disease with heart failure: Secondary | ICD-10-CM | POA: Diagnosis not present

## 2022-06-30 DIAGNOSIS — F32A Depression, unspecified: Secondary | ICD-10-CM | POA: Diagnosis not present

## 2022-06-30 DIAGNOSIS — D509 Iron deficiency anemia, unspecified: Secondary | ICD-10-CM

## 2022-06-30 DIAGNOSIS — I2510 Atherosclerotic heart disease of native coronary artery without angina pectoris: Secondary | ICD-10-CM | POA: Insufficient documentation

## 2022-06-30 DIAGNOSIS — G4730 Sleep apnea, unspecified: Secondary | ICD-10-CM | POA: Insufficient documentation

## 2022-06-30 DIAGNOSIS — Z7984 Long term (current) use of oral hypoglycemic drugs: Secondary | ICD-10-CM | POA: Insufficient documentation

## 2022-06-30 DIAGNOSIS — D500 Iron deficiency anemia secondary to blood loss (chronic): Secondary | ICD-10-CM

## 2022-06-30 DIAGNOSIS — K219 Gastro-esophageal reflux disease without esophagitis: Secondary | ICD-10-CM | POA: Insufficient documentation

## 2022-06-30 DIAGNOSIS — G709 Myoneural disorder, unspecified: Secondary | ICD-10-CM | POA: Diagnosis not present

## 2022-06-30 DIAGNOSIS — K449 Diaphragmatic hernia without obstruction or gangrene: Secondary | ICD-10-CM | POA: Diagnosis not present

## 2022-06-30 DIAGNOSIS — I4891 Unspecified atrial fibrillation: Secondary | ICD-10-CM | POA: Insufficient documentation

## 2022-06-30 DIAGNOSIS — K5520 Angiodysplasia of colon without hemorrhage: Secondary | ICD-10-CM

## 2022-06-30 DIAGNOSIS — E1140 Type 2 diabetes mellitus with diabetic neuropathy, unspecified: Secondary | ICD-10-CM | POA: Insufficient documentation

## 2022-06-30 DIAGNOSIS — I350 Nonrheumatic aortic (valve) stenosis: Secondary | ICD-10-CM | POA: Diagnosis not present

## 2022-06-30 DIAGNOSIS — D649 Anemia, unspecified: Secondary | ICD-10-CM | POA: Diagnosis not present

## 2022-06-30 DIAGNOSIS — D759 Disease of blood and blood-forming organs, unspecified: Secondary | ICD-10-CM | POA: Diagnosis not present

## 2022-06-30 HISTORY — DX: Acute myocardial infarction, unspecified: I21.9

## 2022-06-30 HISTORY — PX: ESOPHAGOGASTRODUODENOSCOPY (EGD) WITH PROPOFOL: SHX5813

## 2022-06-30 HISTORY — PX: ENTEROSCOPY: SHX5533

## 2022-06-30 HISTORY — DX: Malignant (primary) neoplasm, unspecified: C80.1

## 2022-06-30 HISTORY — PX: HOT HEMOSTASIS: SHX5433

## 2022-06-30 LAB — GLUCOSE, CAPILLARY: Glucose-Capillary: 157 mg/dL — ABNORMAL HIGH (ref 70–99)

## 2022-06-30 SURGERY — ENTEROSCOPY
Anesthesia: General

## 2022-06-30 MED ORDER — FERROUS SULFATE 325 (65 FE) MG PO TABS
325.0000 mg | ORAL_TABLET | Freq: Every day | ORAL | 3 refills | Status: DC
Start: 2022-06-30 — End: 2023-10-28

## 2022-06-30 MED ORDER — PROPOFOL 10 MG/ML IV BOLUS
INTRAVENOUS | Status: DC | PRN
  Administered 2022-06-30: 30 mg via INTRAVENOUS
  Administered 2022-06-30: 100 mg via INTRAVENOUS
  Administered 2022-06-30 (×2): 50 mg via INTRAVENOUS
  Administered 2022-06-30: 20 mg via INTRAVENOUS

## 2022-06-30 MED ORDER — LACTATED RINGERS IV SOLN: INTRAVENOUS | Status: DC

## 2022-06-30 MED ORDER — GLUCAGON HCL RDNA (DIAGNOSTIC) 1 MG IJ SOLR
INTRAMUSCULAR | Status: DC | PRN
  Administered 2022-06-30: .5 mg via INTRAVENOUS

## 2022-06-30 MED ORDER — LIDOCAINE HCL 1 % IJ SOLN
INTRAMUSCULAR | Status: DC | PRN
  Administered 2022-06-30: 50 mg via INTRADERMAL

## 2022-06-30 NOTE — Transfer of Care (Signed)
Immediate Anesthesia Transfer of Care Note  Patient: Jonathan Gilmore  Procedure(s) Performed: ENTEROSCOPY ESOPHAGOGASTRODUODENOSCOPY (EGD) WITH PROPOFOL HOT HEMOSTASIS (ARGON PLASMA COAGULATION/BICAP)  Patient Location: Short Stay  Anesthesia Type:General  Level of Consciousness: awake  Airway & Oxygen Therapy: Patient Spontanous Breathing  Post-op Assessment: Report given to RN  Post vital signs: Reviewed and stable  Last Vitals:  Vitals Value Taken Time  BP 112/60 06/30/22 1452  Temp 36.9 C 06/30/22 1452  Pulse 96 06/30/22 1452  Resp 16 06/30/22 1452  SpO2 96 % 06/30/22 1452    Last Pain:  Vitals:   06/30/22 1452  TempSrc: Oral  PainSc: 0-No pain         Complications: No notable events documented.

## 2022-06-30 NOTE — Anesthesia Postprocedure Evaluation (Signed)
Anesthesia Post Note  Patient: Jonathan Gilmore  Procedure(s) Performed: ENTEROSCOPY ESOPHAGOGASTRODUODENOSCOPY (EGD) WITH PROPOFOL HOT HEMOSTASIS (ARGON PLASMA COAGULATION/BICAP)  Patient location during evaluation: Short Stay Anesthesia Type: General Level of consciousness: awake and alert Pain management: pain level controlled Vital Signs Assessment: post-procedure vital signs reviewed and stable Respiratory status: spontaneous breathing Cardiovascular status: blood pressure returned to baseline and stable Postop Assessment: no apparent nausea or vomiting Anesthetic complications: no   No notable events documented.   Last Vitals:  Vitals:   06/30/22 1226 06/30/22 1452  BP: 132/82 112/60  Pulse: 95 96  Resp: 16 16  Temp: 36.8 C 36.9 C  SpO2: 98% 96%    Last Pain:  Vitals:   06/30/22 1452  TempSrc: Oral  PainSc: 0-No pain                 Aquarius Tremper

## 2022-06-30 NOTE — Interval H&P Note (Signed)
History and Physical Interval Note:  06/30/2022 12:32 PM  Jonathan Gilmore  has presented today for surgery, with the diagnosis of iron deficiency anemia, AVMs in capsule endoscopy.  The various methods of treatment have been discussed with the patient and family. After consideration of risks, benefits and other options for treatment, the patient has consented to  Procedure(s) with comments: ESOPHAGOGASTRODUODENOSCOPY (EGD) WITH PROPOFOL (N/A) - 2:00pm;ASA 3 as a surgical intervention.  The patient's history has been reviewed, patient examined, no change in status, stable for surgery.  I have reviewed the patient's chart and labs.  Questions were answered to the patient's satisfaction.     [name]

## 2022-06-30 NOTE — Op Note (Signed)
[facility] Patient Name: Jonathan Gilmore Procedure Date: 06/30/2022 2:05 PM MRN: [id number] Date of Birth: 1950-07-31 Attending MD: Katrinka Blazing , , [id number] CSN: [id number] Age: 72 Admit Type: Outpatient Procedure:                Small bowel enteroscopy Indications:              Iron deficiency anemia, For therapy of angioectasia Providers:                Katrinka Blazing, Crystal Page, Dyann Ruddle Referring MD:              Medicines:                Monitored Anesthesia Care Complications:            No immediate complications. Estimated Blood Loss:     Estimated blood loss: none. Procedure:                Pre-Anesthesia Assessment:                           - Prior to the procedure, a History and Physical                            was performed, and patient medications, allergies                            and sensitivities were reviewed. The patient's                            tolerance of previous anesthesia was reviewed.                           - The risks and benefits of the procedure and the                            sedation options and risks were discussed with the                            patient. All questions were answered and informed                            consent was obtained.                           - ASA Grade Assessment: III - A patient with severe                            systemic disease.                           After obtaining informed consent, the endoscope was                            passed under direct vision. Throughout the                            procedure,  the patient's blood pressure, pulse, and                            oxygen saturations were monitored continuously. The                            [id number] [id number]) scope was introduced through                            the mouth, and advanced to the fourth part of                            duodenum. After obtaining informed consent, the                             endoscope was passed under direct vision.                            Throughout the procedure, the patient's blood                            pressure, pulse, and oxygen saturations were                            monitored continuously.The small bowel enteroscopy                            was accomplished without difficulty. The patient                            tolerated the procedure well. Scope In: 2:25:37 PM Scope Out: 2:45:08 PM Total Procedure Duration: 0 hours 19 minutes 31 seconds  Findings:      A 2 cm hiatal hernia was present.      A few localized diminutive erosions with no stigmata of recent bleeding       were found in the gastric body.      Two angiodysplastic lesions with no bleeding were found in the fourth       portion of the duodenum. Coagulation for bleeding prevention using argon       plasma at 0.3 liters/minute and 20 watts was successful.      Unfortunately, there was significant looping despite applying abdominal       pressure and the scope could not be advanced to the jejunum. Impression:               - 2 cm hiatal hernia.                           - Erosive gastropathy with no stigmata of recent                            bleeding.                           - Two non-bleeding angiodysplastic lesions in the  duodenum. Treated with argon plasma coagulation                            (APC).                           - No specimens collected. Moderate Sedation:      Per Anesthesia Care Recommendation:           - Discharge patient to home (ambulatory).                           - Resume previous diet.                           - Continue oral iron supplementation.                           - Follow up with cardiology regarding TAVR                           - Follow up in GI clinic in May as scheduled. Procedure Code(s):        --- Professional ---                           [phone], Small intestinal endoscopy, enteroscopy                             beyond second portion of duodenum, not including                            ileum; with control of bleeding (eg, injection,                            bipolar cautery, unipolar cautery, laser, heater                            probe, stapler, plasma coagulator) Diagnosis Code(s):        --- Professional ---                           K44.9, Diaphragmatic hernia without obstruction or                            gangrene                           K31.89, Other diseases of stomach and duodenum                           K31.819, Angiodysplasia of stomach and duodenum                            without bleeding                           D50.9, Iron deficiency anemia, unspecified  K55.20, Angiodysplasia of colon without hemorrhage CPT copyright 2022 American Medical Association. All rights reserved. The codes documented in this report are preliminary and upon coder review may  be revised to meet current compliance requirements. Katrinka Blazing, MD Katrinka Blazing,  06/30/2022 2:53:15 PM This report has been signed electronically. Number of Addenda: 0

## 2022-06-30 NOTE — Anesthesia Preprocedure Evaluation (Signed)
Anesthesia Evaluation  Patient identified by MRN, date of birth, ID band Patient awake    Reviewed: Allergy & Precautions, H&P , NPO status , Patient's Chart, lab work & pertinent test results, reviewed documented beta blocker date and time   Airway Mallampati: II  TM Distance: >3 FB Neck ROM: Full   Comment: Cervical spondylosis with myelopathy ANTERIOR CERVICAL DECOMP/DISCECTOMY FUSION Dental  (+) Dental Advisory Given, Missing, Poor Dentition   Pulmonary shortness of breath and with exertion, sleep apnea (non complaint) , Current Smoker and Patient abstained from smoking.   Pulmonary exam normal breath sounds clear to auscultation       Cardiovascular Exercise Tolerance: Good hypertension (did not take beta blockers today), Pt. on medications and Pt. on home beta blockers + CAD, + Past MI and +CHF  + dysrhythmias Atrial Fibrillation + Valvular Problems/Murmurs AS  Rhythm:Irregular Rate:Normal + Systolic murmurs  1. Left ventricular ejection fraction, by estimation, is 55 to 60%. The  left ventricle has normal function. The left ventricle demonstrates  regional wall motion abnormalities (see scoring diagram/findings for  description). There is moderate left  ventricular hypertrophy. Left ventricular diastolic parameters are  consistent with Grade I diastolic dysfunction (impaired relaxation).   2. Right ventricular systolic function is normal. The right ventricular  size is normal.   3. Left atrial size was severely dilated.   4. Right atrial size was mildly dilated.   5. The mitral valve is normal in structure. Mild mitral valve  regurgitation. No evidence of mitral stenosis.   6. Prior AV mean gradient 17 mmHg. The aortic valve is calcified. There  is moderate calcification of the aortic valve. There is moderate  thickening of the aortic valve. Aortic valve regurgitation is not  visualized. Moderate aortic valve stenosis.  Aortic   valve mean gradient measures 23.0 mmHg. Aortic valve Vmax measures 3.08  m/s.   7. The inferior vena cava is dilated in size with <50% respiratory  variability, suggesting right atrial pressure of 15 mmHg.   8. Evidence of atrial level shunting detected by color flow Doppler.  There is a small patent foramen ovale with predominantly left to right  shunting across the atrial septum.   08-Jun-2022 13:05:32 [facility]-AP-300 ROUTINE RECORD 06/16/1950 (71 yr) Male Caucasian Vent. rate 97 BPM PR interval ms QRS duration 86 ms QT/QTcB 370/469 ms P-R-T axes 69 -85 Atrial fibrillation ST & T wave abnormality, consider inferolateral ischemia Prolonged QT Abnormal ECG When compared with ECG of 04-Apr-2022 06:45, Atrial fibrillation has replaced Sinus rhythm    Neuro/Psych  PSYCHIATRIC DISORDERS Anxiety Depression     Neuromuscular disease    GI/Hepatic ,GERD  Medicated,,(+)     substance abuse  marijuana use  Endo/Other  diabetes, Well Controlled, Type 2, Oral Hypoglycemic Agents    Renal/GU negative Renal ROS  negative genitourinary   Musculoskeletal  (+) Arthritis , Osteoarthritis,    Abdominal   Peds negative pediatric ROS (+)  Hematology  (+) Blood dyscrasia, anemia   Anesthesia Other Findings Back pain Narcolepsy cataplexy syndrome  Reproductive/Obstetrics negative OB ROS                             Anesthesia Physical Anesthesia Plan  ASA: 3  Anesthesia Plan: General   Post-op Pain Management: Minimal or no pain anticipated   Induction: Intravenous  PONV Risk Score and Plan: Propofol infusion  Airway Management Planned: Nasal Cannula and Natural Airway  Additional Equipment:   Intra-op Plan:   Post-operative Plan:   Informed Consent: I have reviewed the patients History and Physical, chart, labs and discussed the procedure including the risks, benefits and alternatives for the proposed anesthesia  with the patient or authorized representative who has indicated his/her understanding and acceptance.     Dental advisory given  Plan Discussed with: CRNA and Surgeon  Anesthesia Plan Comments:         Anesthesia Quick Evaluation

## 2022-06-30 NOTE — Discharge Instructions (Signed)
You are being discharged to home.  Resume your previous diet.  Continue oral iron supplementation. Follow up with cardiology regarding TAVR Follow up in GI clinic in May as scheduled.

## 2022-07-05 ENCOUNTER — Encounter (HOSPITAL_COMMUNITY): Payer: Self-pay | Admitting: Gastroenterology

## 2022-07-07 ENCOUNTER — Ambulatory Visit (HOSPITAL_COMMUNITY)
Admission: RE | Admit: 2022-07-07 | Discharge: 2022-07-07 | Disposition: A | Discharge status: Home / Self Care | Payer: Medicare Other | Source: Ambulatory Visit | Attending: Urology | Admitting: Urology

## 2022-07-07 NOTE — Progress Notes (Signed)
Patient was a No Show for his MRI Prostate scheduled today. I called and spoke to wife, she would like to discuss with husband. I gave her Centralized Scheduling's  number to RS.

## 2022-07-14 ENCOUNTER — Ambulatory Visit: Payer: Medicare Other | Admitting: Urology

## 2022-07-16 ENCOUNTER — Ambulatory Visit (HOSPITAL_COMMUNITY)
Admission: RE | Admit: 2022-07-16 | Discharge: 2022-07-16 | Disposition: A | Discharge status: Home / Self Care | Payer: Medicare Other | Source: Ambulatory Visit | Attending: Urology | Admitting: Urology

## 2022-07-16 DIAGNOSIS — C61 Malignant neoplasm of prostate: Secondary | ICD-10-CM | POA: Insufficient documentation

## 2022-07-16 MED ORDER — GADOBUTROL 1 MMOL/ML IV SOLN
9.0000 mL | Freq: Once | INTRAVENOUS | Status: AC | PRN
  Administered 2022-07-16: 9 mL via INTRAVENOUS

## 2022-07-27 ENCOUNTER — Other Ambulatory Visit: Payer: Self-pay | Admitting: Family Medicine

## 2022-07-27 DIAGNOSIS — E1159 Type 2 diabetes mellitus with other circulatory complications: Secondary | ICD-10-CM

## 2022-07-27 DIAGNOSIS — M5116 Intervertebral disc disorders with radiculopathy, lumbar region: Secondary | ICD-10-CM

## 2022-07-28 NOTE — Telephone Encounter (Signed)
Will refill metformin, gabapentin temporarily as office visit planned in June, can review meds further and updated labs at that time.

## 2022-07-28 NOTE — Telephone Encounter (Signed)
Gabapentin 300 mg LOV: 05/19/22 Last Refill:10/23/22 Upcoming appt: 08/19/22

## 2022-08-02 ENCOUNTER — Ambulatory Visit (INDEPENDENT_AMBULATORY_CARE_PROVIDER_SITE_OTHER): Payer: Medicare Other | Admitting: Gastroenterology

## 2022-08-02 ENCOUNTER — Encounter (INDEPENDENT_AMBULATORY_CARE_PROVIDER_SITE_OTHER): Payer: Self-pay | Admitting: Gastroenterology

## 2022-08-02 VITALS — BP 127/65 | HR 80 | Temp 97.3°F | Ht 66.5 in | Wt 200.0 lb

## 2022-08-02 DIAGNOSIS — Z8719 Personal history of other diseases of the digestive system: Secondary | ICD-10-CM | POA: Diagnosis not present

## 2022-08-02 DIAGNOSIS — K5520 Angiodysplasia of colon without hemorrhage: Secondary | ICD-10-CM

## 2022-08-02 DIAGNOSIS — D500 Iron deficiency anemia secondary to blood loss (chronic): Secondary | ICD-10-CM | POA: Diagnosis not present

## 2022-08-02 MED ORDER — PANTOPRAZOLE SODIUM 40 MG PO TBEC: 40.0000 mg | DELAYED_RELEASE_TABLET | Freq: Every day | ORAL | 3 refills | Status: DC

## 2022-08-02 NOTE — Patient Instructions (Addendum)
Perform blood workup today Continue ferrous sulfate 325 mg every day Follow-up with Dr. Wyline Mood regarding TAVR

## 2022-08-02 NOTE — Progress Notes (Unsigned)
Katrinka Blazing, M.D. Gastroenterology & Hepatology [facility] Gastroenterology [address] [place], [place] [address]  Primary Care Physician: Shade Flood, MD [address] [place] [address]  I will communicate my assessment and recommendations to the referring MD via EMR.  Problems: Recurrent iron deficiency anemia secondary to small bowel AVMs H/o esophagitis History of colon polyps   History of Present Illness: Jonathan Gilmore is a 72 y.o. male with Pmh anxiety, diabetes, moderate aortic stenosis, CHF, HTN, depression, CAD, history of AVMs in the small bowel, who presents for follow up of iron deficiency anemia.  The patient was last seen on 04/22/2022. At that time, the patient was scheduled for a capsule endoscopy and continue pantoprazole 40 mg every day.  Capsule endoscopy performed on 05/11/2022 showed few small nonbleeding AVMs in the proximal small bowel and to nonbleeding AVMs in the distal small bowel.  I advised the patient to follow-up with cardiology for possibility of aortic valve repair/TAVR.  Due to the findings, he underwent a push enteroscopy on 06/30/2022 which showed few erosions in the gastric body.  There was presence of 2 AVMs in the 4 portion of the duodenum which were ablated with APC.  However, the scope could not be advanced further given the presence of significant looping.  Most recent hemoglobin on 06/10/2022 was 11.9 with MCV of 81.  Most recent iron stores on 04/04/2022 showed iron of 31, ferritin 9, iron saturation 8%.  Patient is currently taking oral ferrous sulfate daily.  Patient denies any complaints at the moment. States he always feels fatigued but this is not related to his blood loss in the past. The patient denies having any nausea, vomiting, fever, chills, hematochezia, melena, hematemesis, abdominal distention, abdominal pain, diarrhea, jaundice, pruritus or weight loss.  He will see Dr. Wyline Mood in June  to discuss TAVR candidacy.  Last EGD: 04/04/2022 showed a normal esophagus, stomach, duodenum.   Last  Colonoscopy: 04/05/22 showed a 5 mm polyp in the cecum ( had hyperplastic changes).    Previous  Colonoscopies: 02/2021-presents anal condyloma, 7 polyps were found in the transverse, ascending colon and cecum, polyps measured between 4 to 10 mm.  There was presence of moderate amount of semisolid stool All polyps were tubular adenomas   10/2019 - fair colon prep -he had a total of 5 polyps removed from the colon with polyps up to 20 mm x 2 and a 12 mm polyps x1 (pathology consistent with tubular adenomas), diverticulosis.   Past Medical History: Past Medical History:  Diagnosis Date   Abnormal cardiovascular stress test 03/2013   Anxiety    Arthritis    Back pain    Cancer (HCC)    Prostate cancer-no treatment per pt   CHF (congestive heart failure) (HCC)    Coronary artery disease    Multivessel with significant LAD involvement by chest CT 2014   Depression    Diastolic dysfunction    Essential hypertension    Lumbar herniated disc    L4-L5   MRSA (methicillin resistant staph aureus) culture positive    Myocardial infarction (HCC)    Neuropathy    Shortness of breath dyspnea    Sleep apnea    No cpap   Type 2 diabetes mellitus (HCC)     Past Surgical History: Past Surgical History:  Procedure Laterality Date   ANTERIOR CERVICAL DECOMP/DISCECTOMY FUSION  01/18/2011   Procedure: ANTERIOR CERVICAL DECOMPRESSION/DISCECTOMY FUSION 2 LEVELS;  Surgeon:  Kathaleen Maser Pool;  Location: [facility];  Service: Neurosurgery;  Laterality: N/A;  anterior cervical discectomy and fusion with allograft and plating, cervical five-six, cervical six-seven   BIOPSY N/A 04/04/2015   Procedure: BIOPSY;  Surgeon: Malissa Hippo, MD;  Location: [facility];  Service: Endoscopy;  Laterality: N/A;   BIOPSY  11/09/2019   Procedure: BIOPSY;  Surgeon: Dolores Frame, MD;  Location: [facility];  Service: Gastroenterology;;   Bone spur removed  1997   Left shoulder   CARDIAC CATHETERIZATION N/A 08/08/2015   Procedure: Right/Left Heart Cath and Coronary Angiography;  Surgeon: Tonny Bollman, MD;  Location: [facility] INVASIVE CV LAB;  Service: Cardiovascular;  Laterality: N/A;   CARPAL TUNNEL RELEASE Right    COLONOSCOPY WITH PROPOFOL N/A 11/09/2019   Procedure: COLONOSCOPY WITH PROPOFOL;  Surgeon: Dolores Frame, MD;  Location: [facility];  Service: Gastroenterology;  Laterality: N/A;  900   COLONOSCOPY WITH PROPOFOL N/A 02/27/2021   Procedure: COLONOSCOPY WITH PROPOFOL;  Surgeon: Dolores Frame, MD;  [facility]: [facility];  Service: Gastroenterology;  Laterality: N/A;  7:30   COLONOSCOPY WITH PROPOFOL N/A 04/05/2022   Procedure: COLONOSCOPY WITH PROPOFOL;  Surgeon: Napoleon Form, MD;  Location: [facility] ENDOSCOPY;  Service: Gastroenterology;  Laterality: N/A;   ENTEROSCOPY N/A 06/30/2022   Procedure: ENTEROSCOPY;  Surgeon: Dolores Frame, MD;  [facility]: [facility];  Service: Gastroenterology;  Laterality: N/A;  2:00pm;ASA 3   ESOPHAGEAL DILATION N/A 04/04/2015   Procedure: ESOPHAGEAL DILATION;  Surgeon: Malissa Hippo, MD;  [facility]: [facility];  Service: Endoscopy;  Laterality: N/A;   ESOPHAGEAL DILATION N/A 11/09/2019   Procedure: ESOPHAGEAL DILATION;  Surgeon: Dolores Frame, MD;  [facility];  Service: Gastroenterology;  Laterality: N/A;   ESOPHAGOGASTRODUODENOSCOPY N/A 04/04/2015   Procedure: ESOPHAGOGASTRODUODENOSCOPY (EGD);  Surgeon: Malissa Hippo, MD;  [facility];  Service: Endoscopy;  Laterality: N/A;  1240   ESOPHAGOGASTRODUODENOSCOPY (EGD) WITH PROPOFOL N/A 11/09/2019   Procedure: ESOPHAGOGASTRODUODENOSCOPY (EGD) WITH PROPOFOL;  Surgeon: Dolores Frame, MD;  [facility];  Service: Gastroenterology;  Laterality: N/A;   ESOPHAGOGASTRODUODENOSCOPY (EGD) WITH PROPOFOL N/A 04/04/2022    Procedure: ESOPHAGOGASTRODUODENOSCOPY (EGD) WITH PROPOFOL;  Surgeon: Benancio Deeds, MD;  Location: [facility]OSCOPY;  Service: Gastroenterology;  Laterality: N/A;   ESOPHAGOGASTRODUODENOSCOPY (EGD) WITH PROPOFOL  06/30/2022   Procedure: ESOPHAGOGASTRODUODENOSCOPY (EGD) WITH PROPOFOL;  Surgeon: Dolores Frame, MD;  [facility];  Service: Gastroenterology;;   GIVENS CAPSULE STUDY N/A 05/11/2022   Procedure: GIVENS CAPSULE STUDY;  Surgeon: Dolores Frame, MD;  [facility]: [facility];  Service: Gastroenterology;  Laterality: N/A;  7:30 am   HEMORRHOID SURGERY     HOT HEMOSTASIS  06/30/2022   Procedure: HOT HEMOSTASIS (ARGON PLASMA COAGULATION/BICAP);  Surgeon: Marguerita Merles, Reuel Boom, MD;  Location: [facility];  Service: Gastroenterology;;   POLYPECTOMY  11/09/2019   Procedure: POLYPECTOMY;  Surgeon: Dolores Frame, MD;  [facility];  Service: Gastroenterology;;   POLYPECTOMY  02/27/2021   Procedure: POLYPECTOMY;  Surgeon: Dolores Frame, MD;  [facility]: [facility];  Service: Gastroenterology;;   POLYPECTOMY  04/05/2022   Procedure: POLYPECTOMY;  Surgeon: Napoleon Form, MD;  Location: [facility] ENDOSCOPY;  Service: Gastroenterology;;   TONSILLECTOMY      Family History: Family History  Problem Relation Age of Onset   Lung cancer Mother    Diabetes Father    Hepatitis B Father  Diabetes Sister    Cushing syndrome Sister     Social History: Social History   Tobacco Use  Smoking Status Every Day   Packs/day: 1.00   Years: 50.00   Additional pack years: 0.00   Total pack years: 50.00   Types: Cigarettes   Start date: 11/26/1967  Smokeless Tobacco Former  Tobacco Comments   smoking x 50 yrs   Social History   Substance and Sexual Activity  Alcohol Use Not Currently   Comment: rare   Social History   Substance and Sexual Activity  Drug Use Yes   Frequency: 5.0 times per week   Types: Marijuana    Comment: as needed    Allergies: Allergies  Allergen Reactions   Morphine And Codeine Nausea And Vomiting    Medications: Current Outpatient Medications  Medication Sig Dispense Refill   ACCU-CHEK AVIVA PLUS test strip USE AS INSTRUCTED 100 strip 3   aspirin EC 81 MG tablet Take 1 tablet (81 mg total) by mouth daily. 30 tablet 11   atorvastatin (LIPITOR) 80 MG tablet TAKE 1 TABLET BY MOUTH DAILY 90 tablet 3   Calcium-Magnesium-Zinc (CAL-MAG-ZINC PO) Take 1 tablet by mouth daily.     ferrous sulfate 325 (65 FE) MG tablet Take 1 tablet (325 mg total) by mouth daily with breakfast. 90 tablet 3   folic acid (FOLVITE) 1 MG tablet Take 1 tablet (1 mg total) by mouth daily. 30 tablet 0   furosemide (LASIX) 40 MG tablet Take 1 tablet (40 mg total) by mouth as needed for edema (swelling).     gabapentin (NEURONTIN) 300 MG capsule TAKE 1 TO 2 CAPSULES BY MOUTH  TWICE DAILY 360 capsule 3   lisinopril (ZESTRIL) 20 MG tablet Take 1 tablet (20 mg total) by mouth daily. 90 tablet 3   magnesium oxide (MAG-OX) 400 MG tablet Take 400 mg by mouth at bedtime.     metFORMIN (GLUCOPHAGE) 500 MG tablet TAKE 2 TABLETS BY MOUTH TWICE  DAILY 360 tablet 3   metoprolol succinate (TOPROL-XL) 50 MG 24 hr tablet Take 1 tablet (50 mg total) by mouth daily. 90 tablet 3   Multiple Vitamin (MULTIVITAMIN WITH MINERALS) TABS Take 1 tablet by mouth daily.     nitroGLYCERIN (NITROSTAT) 0.4 MG SL tablet Place 1 tablet (0.4 mg total) under the tongue every 5 (five) minutes x 3 doses as needed for chest pain (if no relief after 2nd dose, proceed to ED for an evaluation). 25 tablet 3   pantoprazole (PROTONIX) 40 MG tablet Take 1 tablet (40 mg total) by mouth daily. 90 tablet 3   No current facility-administered medications for this visit.    Review of Systems: GENERAL: negative for malaise, night sweats HEENT: No changes in hearing or vision, no nose bleeds or other nasal problems. NECK: Negative for lumps, goiter, pain and  significant neck swelling RESPIRATORY: Negative for cough, wheezing CARDIOVASCULAR: Negative for chest pain, leg swelling, palpitations, orthopnea GI: SEE HPI MUSCULOSKELETAL: Negative for joint pain or swelling, back pain, and muscle pain. SKIN: Negative for lesions, rash PSYCH: Negative for sleep disturbance, mood disorder and recent psychosocial stressors. HEMATOLOGY Negative for prolonged bleeding, bruising easily, and swollen nodes. ENDOCRINE: Negative for cold or heat intolerance, polyuria, polydipsia and goiter. NEURO: negative for tremor, gait imbalance, syncope and seizures. The remainder of the review of systems is noncontributory.   Physical Exam: BP 127/65 (BP Location: Right Arm, Patient Position: Sitting, Cuff Size: Normal)   Pulse 80   Temp Marland Kitchen)  97.3 F (36.3 C) (Temporal)   Ht 5' 6.5" (1.689 m)   Wt 200 lb (90.7 kg)   BMI 31.80 kg/m  GENERAL: The patient is AO x3, in no acute distress. HEENT: Head is normocephalic and atraumatic. EOMI are intact. Mouth is well hydrated and without lesions. NECK: Supple. No masses LUNGS: Clear to auscultation. No presence of rhonchi/wheezing/rales. Adequate chest expansion HEART: RRR, normal s1 and s2. ABDOMEN: Soft, nontender, no guarding, no peritoneal signs, and nondistended. BS +. No masses. EXTREMITIES: Without any cyanosis, clubbing, rash, lesions or edema. NEUROLOGIC: AOx3, no focal motor deficit. SKIN: no jaundice, no rashes  Imaging/Labs: as above  I personally reviewed and interpreted the available labs, imaging and endoscopic files.  Impression and Plan: Jonathan Gilmore is a 72 y.o. male with Pmh anxiety, diabetes, moderate aortic stenosis, CHF, HTN, depression, CAD, history of AVMs in the small bowel, who presents for follow up of iron deficiency anemia.  The patient has not presented any overt gastrointestinal bleeding and seems to have responded symptomatically to his previous transfusion and to the oral daily intake  of iron.  It remains to be seen if he has not responded to his current management, especially after undergoing ablation of 2 AVMs in his proximal small bowel.  Due to this, we will repeat a CBC and iron stores today.  I advised him to continue taking ferrous sulfate on a daily basis.  He should continue with omeprazole 40 mg every day.  We also discussed in detail the importance of following up with Dr. Wyline Mood to determine if he will be a candidate for TAVR.  This is especially important in this case as moderate aortic stenosis who presents post to recurrent issues with AVMs.  -Check CBC and iron stores -Continue ferrous sulfate 325 mg every day -Follow-up with Dr. Wyline Mood regarding TAVR  All questions were answered.      Katrinka Blazing, MD Gastroenterology and Hepatology [facility] Gastroenterology

## 2022-08-13 ENCOUNTER — Institutional Professional Consult (permissible substitution): Payer: Medicare Other | Admitting: Pulmonary Disease

## 2022-08-19 ENCOUNTER — Encounter: Payer: Self-pay | Admitting: Family Medicine

## 2022-08-19 ENCOUNTER — Ambulatory Visit (INDEPENDENT_AMBULATORY_CARE_PROVIDER_SITE_OTHER): Payer: Medicare Other | Admitting: Family Medicine

## 2022-08-19 VITALS — BP 130/74 | HR 78 | Temp 98.1°F | Ht 66.5 in | Wt 191.0 lb

## 2022-08-19 DIAGNOSIS — I739 Peripheral vascular disease, unspecified: Secondary | ICD-10-CM

## 2022-08-19 DIAGNOSIS — R5383 Other fatigue: Secondary | ICD-10-CM | POA: Diagnosis not present

## 2022-08-19 DIAGNOSIS — E782 Mixed hyperlipidemia: Secondary | ICD-10-CM

## 2022-08-19 DIAGNOSIS — I5030 Unspecified diastolic (congestive) heart failure: Secondary | ICD-10-CM

## 2022-08-19 DIAGNOSIS — I10 Essential (primary) hypertension: Secondary | ICD-10-CM | POA: Diagnosis not present

## 2022-08-19 DIAGNOSIS — K5520 Angiodysplasia of colon without hemorrhage: Secondary | ICD-10-CM

## 2022-08-19 DIAGNOSIS — G4700 Insomnia, unspecified: Secondary | ICD-10-CM

## 2022-08-19 DIAGNOSIS — D500 Iron deficiency anemia secondary to blood loss (chronic): Secondary | ICD-10-CM

## 2022-08-19 DIAGNOSIS — I350 Nonrheumatic aortic (valve) stenosis: Secondary | ICD-10-CM

## 2022-08-19 DIAGNOSIS — E1159 Type 2 diabetes mellitus with other circulatory complications: Secondary | ICD-10-CM

## 2022-08-19 DIAGNOSIS — Z7984 Long term (current) use of oral hypoglycemic drugs: Secondary | ICD-10-CM

## 2022-08-19 LAB — COMPREHENSIVE METABOLIC PANEL
ALT: 13 U/L (ref 0–53)
AST: 16 U/L (ref 0–37)
Albumin: 3.9 g/dL (ref 3.5–5.2)
Alkaline Phosphatase: 92 U/L (ref 39–117)
BUN: 15 mg/dL (ref 6–23)
CO2: 31 mEq/L (ref 19–32)
Calcium: 9 mg/dL (ref 8.4–10.5)
Chloride: 101 mEq/L (ref 96–112)
Creatinine, Ser: 0.68 mg/dL (ref 0.40–1.50)
GFR: 93.37 mL/min (ref >60.00)
Glucose, Bld: 131 mg/dL — ABNORMAL HIGH (ref 70–99)
Potassium: 3.6 mEq/L (ref 3.5–5.1)
Sodium: 141 mEq/L (ref 135–145)
Total Bilirubin: 0.7 mg/dL (ref 0.2–1.2)
Total Protein: 6.8 g/dL (ref 6.0–8.3)

## 2022-08-19 LAB — CBC
HCT: 43.4 % (ref 39.0–52.0)
Hemoglobin: 13.9 g/dL (ref 13.0–17.0)
MCHC: 32.1 g/dL (ref 30.0–36.0)
MCV: 82.2 fl (ref 78.0–100.0)
Platelets: 239 10*3/uL (ref 150.0–400.0)
RBC: 5.27 Mil/uL (ref 4.22–5.81)
RDW: 22.7 % — ABNORMAL HIGH (ref 11.5–15.5)
WBC: 7 10*3/uL (ref 4.0–10.5)

## 2022-08-19 LAB — LIPID PANEL
Cholesterol: 96 mg/dL (ref 0–200)
HDL: 33.5 mg/dL — ABNORMAL LOW (ref >39.00)
LDL Cholesterol: 35 mg/dL (ref 0–99)
NonHDL: 62.21
Total CHOL/HDL Ratio: 3
Triglycerides: 135 mg/dL (ref 0.0–149.0)
VLDL: 27 mg/dL (ref 0.0–40.0)

## 2022-08-19 LAB — HEMOGLOBIN A1C: Hgb A1c MFr Bld: 8.4 % — ABNORMAL HIGH (ref 4.6–6.5)

## 2022-08-19 NOTE — Patient Instructions (Addendum)
Take breaks in the heat and make sure to drink plenty of fluids. If any return of fatigue in the heat, be seen.  I will check some labs today. No med changes for now and follow up with cardiology tomorrow.   Fatigue could be due to decreased sleep.  Try melatonin before you go to sleep and see other information below on sleep to see if that helps.  Recheck with me in 1 month. Return to the clinic or go to the nearest emergency room if any of your symptoms worsen or new symptoms occur.  Insomnia Insomnia is a sleep disorder that makes it difficult to fall asleep or stay asleep. Insomnia can cause fatigue, low energy, difficulty concentrating, mood swings, and poor performance at work or school. There are three different ways to classify insomnia: Difficulty falling asleep. Difficulty staying asleep. Waking up too early in the morning. Any type of insomnia can be long-term (chronic) or short-term (acute). Both are common. Short-term insomnia usually lasts for 3 months or less. Chronic insomnia occurs at least three times a week for longer than 3 months. What are the causes? Insomnia may be caused by another condition, situation, or substance, such as: Having certain mental health conditions, such as anxiety and depression. Using caffeine, alcohol, tobacco, or drugs. Having gastrointestinal conditions, such as gastroesophageal reflux disease (GERD). Having certain medical conditions. These include: Asthma. Alzheimer's disease. Stroke. Chronic pain. An overactive thyroid gland (hyperthyroidism). Other sleep disorders, such as restless legs syndrome and sleep apnea. Menopause. Sometimes, the cause of insomnia may not be known. What increases the risk? Risk factors for insomnia include: Gender. Females are affected more often than males. Age. Insomnia is more common as people get older. Stress and certain medical and mental health conditions. Lack of exercise. Having an irregular work  schedule. This may include working night shifts and traveling between different time zones. What are the signs or symptoms? If you have insomnia, the main symptom is having trouble falling asleep or having trouble staying asleep. This may lead to other symptoms, such as: Feeling tired or having low energy. Feeling nervous about going to sleep. Not feeling rested in the morning. Having trouble concentrating. Feeling irritable, anxious, or depressed. How is this diagnosed? This condition may be diagnosed based on: Your symptoms and medical history. Your health care provider may ask about: Your sleep habits. Any medical conditions you have. Your mental health. A physical exam. How is this treated? Treatment for insomnia depends on the cause. Treatment may focus on treating an underlying condition that is causing the insomnia. Treatment may also include: Medicines to help you sleep. Counseling or therapy. Lifestyle adjustments to help you sleep better. Follow these instructions at home: Eating and drinking  Limit or avoid alcohol, caffeinated beverages, and products that contain nicotine and tobacco, especially close to bedtime. These can disrupt your sleep. Do not eat a large meal or eat spicy foods right before bedtime. This can lead to digestive discomfort that can make it hard for you to sleep. Sleep habits  Keep a sleep diary to help you and your health care provider figure out what could be causing your insomnia. Write down: When you sleep. When you wake up during the night. How well you sleep and how rested you feel the next day. Any side effects of medicines you are taking. What you eat and drink. Make your bedroom a dark, comfortable place where it is easy to fall asleep. Put up shades or blackout curtains  to block light from outside. Use a white noise machine to block noise. Keep the temperature cool. Limit screen use before bedtime. This includes: Not watching TV. Not  using your smartphone, tablet, or computer. Stick to a routine that includes going to bed and waking up at the same times every day and night. This can help you fall asleep faster. Consider making a quiet activity, such as reading, part of your nighttime routine. Try to avoid taking naps during the day so that you sleep better at night. Get out of bed if you are still awake after 15 minutes of trying to sleep. Keep the lights down, but try reading or doing a quiet activity. When you feel sleepy, go back to bed. General instructions Take over-the-counter and prescription medicines only as told by your health care provider. Exercise regularly as told by your health care provider. However, avoid exercising in the hours right before bedtime. Use relaxation techniques to manage stress. Ask your health care provider to suggest some techniques that may work well for you. These may include: Breathing exercises. Routines to release muscle tension. Visualizing peaceful scenes. Make sure that you drive carefully. Do not drive if you feel very sleepy. Keep all follow-up visits. This is important. Contact a health care provider if: You are tired throughout the day. You have trouble in your daily routine due to sleepiness. You continue to have sleep problems, or your sleep problems get worse. Get help right away if: You have thoughts about hurting yourself or someone else. Get help right away if you feel like you may hurt yourself or others, or have thoughts about taking your own life. Go to your nearest emergency room or: Call 911. Call the National Suicide Prevention Lifeline at 970 534 5409 or 988. This is open 24 hours a day. Text the Crisis Text Line at 775 878 5883. Summary Insomnia is a sleep disorder that makes it difficult to fall asleep or stay asleep. Insomnia can be long-term (chronic) or short-term (acute). Treatment for insomnia depends on the cause. Treatment may focus on treating an  underlying condition that is causing the insomnia. Keep a sleep diary to help you and your health care provider figure out what could be causing your insomnia. This information is not intended to replace advice given to you by your health care provider. Make sure you discuss any questions you have with your health care provider. Document Revised: 02/09/2021 Document Reviewed: 02/09/2021 Elsevier Patient Education  2024 [employer].

## 2022-08-19 NOTE — Progress Notes (Signed)
Subjective:  Patient ID: Jonathan Gilmore, male    DOB: 10-03-1950  Age: 72 y.o. MRN: [id number]  CC:  Chief Complaint  Patient presents with   Medical Management of Chronic Issues    Pt doing well 3 month check in     HPI Jonathan Gilmore presents for      Anemia Subacute GI blood loss previously, AVMs noted on capsule endoscopy. prior transfusions.  Daily iron supplementation. GI, Dr. Levon Hedger.  Enteroscopy on April 17 -few erosions in the gastric body and 2 AVMs in the duodenum that were ablated with APC.  Follow-up with GI May 20 noted.  Continued on daily ferrous sulfate and protonix 40 mg daily.  Plan for cardiology follow-up and to see if he is a candidate for TAVR given moderate aortic stenosis and recurrent issues with AVMs.  Has not had repeat labs for CBC, iron since GI visit. Still taking iron daily and PPI daily.  No recent dark stools or blood in stools.  Similar fatigue as in past, Lab Results  Component Value Date   WBC 8.8 06/10/2022   HGB 11.9 (L) 06/10/2022   HCT 39.7 06/10/2022   MCV 81.9 06/10/2022   PLT 271 06/10/2022    Cardiac, diastolic CHF, hypertension, PAD. History of exertional dyspnea, diastolic CHF, treated with furosemide daily, few times per week potassium supplementation.  Also with history of aortic stenosis, cardiology Dr. Wyline Mood.  Appointment tomorrow.  Lisinopril 20 mg daily, Toprol-XL 50 mg daily, on statin as below.  Lasix 40 mg daily.  No chest pains. Some persistent fatigue.  Home readings - rare, 130/70.  No med side effects.  Wt Readings from Last 3 Encounters:  08/19/22 191 lb (86.6 kg)  08/02/22 200 lb (90.7 kg)  06/09/22 194 lb (88 kg)   BP Readings from Last 3 Encounters:  08/19/22 130/74  08/02/22 127/65  06/30/22 112/60   Lab Results  Component Value Date   CREATININE 0.70 06/10/2022   Diabetes: With vascular disease as above. On ACE inhibitor, statin, and metformin 500 mg - 2 pills twice per day for total dose of  1000 mg twice daily. Microalbumin: Normal ratio 08/20/2021 Optho, foot exam, pneumovax: Up-to-date Home readings: No recent symptomatic lows or 200's. 120-150 usually.  Fatigue in heat with sweating. No syncope/near-syncope.  Persistent fatigue.  Trouble with sleep onset, but once he does fall asleep he remains asleep.  Goes to bed around 2 AM, wakes up at 6 AM.  Difficulty falling asleep.  Years ago took melatonin which was helpful, has not tried that recently.  Lab Results  Component Value Date   HGBA1C 8.3 (H) 04/02/2022   HGBA1C 7.6 (H) 10/22/2021   HGBA1C 7.9 (A) 05/25/2021   Lab Results  Component Value Date   MICROALBUR <0.7 08/20/2021   LDLCALC 54 04/03/2022   CREATININE 0.70 06/10/2022   Hyperlipidemia: Lipitor 80 mg daily, no new side effects or myalgias.  Lab Results  Component Value Date   CHOL 98 04/03/2022   HDL 34 (L) 04/03/2022   LDLCALC 54 04/03/2022   TRIG 52 04/03/2022   CHOLHDL 2.9 04/03/2022   Lab Results  Component Value Date   ALT 16 04/02/2022   AST 22 04/02/2022   ALKPHOS 85 04/02/2022   BILITOT 0.5 04/02/2022          History Patient Active Problem List   Diagnosis Date Noted   AVM (arteriovenous malformation) of small bowel, acquired 06/30/2022   Iron deficiency anemia due  to chronic blood loss 04/22/2022   History of esophagitis 04/22/2022   Heme positive stool 04/04/2022   Iron deficiency anemia 04/04/2022   Acute blood loss anemia 04/02/2022   Prostate cancer (HCC) 06/08/2021   History of colonic polyps 02/09/2021   Intestinal metaplasia of antrum of stomach without dysplasia 02/09/2021   Insomnia 03/12/2019   Pure hypercholesterolemia 11/29/2018   Hypogonadism, male 11/23/2018   Essential hypertension, benign 11/13/2018   High risk medication use 10/28/2018   Carotid stenosis, asymptomatic, bilateral 10/28/2018   Tobacco use 10/28/2018   Polyp of colon 10/28/2018   Lumbar disc herniation with radiculopathy 12/21/2017    Spinal stenosis of lumbar region without neurogenic claudication 12/21/2017   CAD -50% LAD  08/09/2015   Moderate aortic stenosis 08/09/2015   Acute pulmonary edema (HCC) 08/07/2015   Acute diastolic CHF (congestive heart failure) (HCC) 08/07/2015   Hypersomnia with sleep apnea, unspecified 09/20/2013   Syncope 09/19/2013   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   OSA (obstructive sleep apnea) -non compliant 09/19/2013   Narcolepsy and cataplexy 09/19/2013   Facial abscess 04/28/2013   Facial cellulitis 04/28/2013   Mixed hyperlipidemia 04/28/2013   Tobacco dependence 04/28/2013   Sepsis (HCC) 04/28/2013   Gout 04/28/2013   Diastolic CHF (HCC) 04/28/2013   Pleuritic pain 02/24/2013   Chest pain 02/24/2013   Murmur 02/24/2013   Pericarditis 02/24/2013   DM type 2 causing vascular disease (HCC) 02/24/2013   Essential hypertension 02/24/2013   Cervical spondylosis with myelopathy 01/19/2011   Past Medical History:  Diagnosis Date   Abnormal cardiovascular stress test 03/2013   Anxiety    Arthritis    Back pain    Cancer (HCC)    Prostate cancer-no treatment per pt   CHF (congestive heart failure) (HCC)    Coronary artery disease    Multivessel with significant LAD involvement by chest CT 2014   Depression    Diastolic dysfunction    Essential hypertension    Lumbar herniated disc    L4-L5   MRSA (methicillin resistant staph aureus) culture positive    Myocardial infarction (HCC)    Neuropathy    Shortness of breath dyspnea    Sleep apnea    No cpap   Type 2 diabetes mellitus (HCC)    Past Surgical History:  Procedure Laterality Date   ANTERIOR CERVICAL DECOMP/DISCECTOMY FUSION  01/18/2011   Procedure: ANTERIOR CERVICAL DECOMPRESSION/DISCECTOMY FUSION 2 LEVELS;  Surgeon: Kathaleen Maser Pool;  Location: [facility];  Service: Neurosurgery;  Laterality: N/A;  anterior cervical discectomy and fusion with allograft and plating, cervical five-six, cervical six-seven   BIOPSY  N/A 04/04/2015   Procedure: BIOPSY;  Surgeon: Malissa Hippo, MD;  Location: [facility];  Service: Endoscopy;  Laterality: N/A;   BIOPSY  11/09/2019   Procedure: BIOPSY;  Surgeon: Dolores Frame, MD;  Location: [facility];  Service: Gastroenterology;;   Bone spur removed  1997   Left shoulder   CARDIAC CATHETERIZATION N/A 08/08/2015   Procedure: Right/Left Heart Cath and Coronary Angiography;  Surgeon: Tonny Bollman, MD;  Location: [facility] INVASIVE CV LAB;  Service: Cardiovascular;  Laterality: N/A;   CARPAL TUNNEL RELEASE Right    COLONOSCOPY WITH PROPOFOL N/A 11/09/2019   Procedure: COLONOSCOPY WITH PROPOFOL;  Surgeon: Dolores Frame, MD;  Location: [facility];  Service: Gastroenterology;  Laterality: N/A;  900   COLONOSCOPY WITH PROPOFOL N/A 02/27/2021   Procedure: COLONOSCOPY WITH PROPOFOL;  Surgeon: Dolores Frame, MD;  Location: [facility];  Service: Gastroenterology;  Laterality: N/A;  7:30   COLONOSCOPY WITH PROPOFOL N/A 04/05/2022   Procedure: COLONOSCOPY WITH PROPOFOL;  Surgeon: Napoleon Form, MD;  Location: [facility];  Service: Gastroenterology;  Laterality: N/A;   ENTEROSCOPY N/A 06/30/2022   Procedure: ENTEROSCOPY;  Surgeon: Dolores Frame, MD;  Location: [facility];  Service: Gastroenterology;  Laterality: N/A;  2:00pm;ASA 3   ESOPHAGEAL DILATION N/A 04/04/2015   Procedure: ESOPHAGEAL DILATION;  Surgeon: Malissa Hippo, MD;  [facility]: [facility];  Service: Endoscopy;  Laterality: N/A;   ESOPHAGEAL DILATION N/A 11/09/2019   Procedure: ESOPHAGEAL DILATION;  Surgeon: Dolores Frame, MD;  Location: [facility];  Service: Gastroenterology;  Laterality: N/A;   ESOPHAGOGASTRODUODENOSCOPY N/A 04/04/2015   Procedure: ESOPHAGOGASTRODUODENOSCOPY (EGD);  Surgeon: Malissa Hippo, MD;  [facility]: [facility];  Service: Endoscopy;  Laterality: N/A;  1240   ESOPHAGOGASTRODUODENOSCOPY (EGD) WITH PROPOFOL N/A 11/09/2019    Procedure: ESOPHAGOGASTRODUODENOSCOPY (EGD) WITH PROPOFOL;  Surgeon: Dolores Frame, MD;  [facility]: [facility];  Service: Gastroenterology;  Laterality: N/A;   ESOPHAGOGASTRODUODENOSCOPY (EGD) WITH PROPOFOL N/A 04/04/2022   Procedure: ESOPHAGOGASTRODUODENOSCOPY (EGD) WITH PROPOFOL;  Surgeon: Benancio Deeds, MD;  Location[facility] ENDOSCOPY;  Service: Gastroenterology;  Laterality: N/A;   ESOPHAGOGASTRODUODENOSCOPY (EGD) WITH PROPOFOL  06/30/2022   Procedure: ESOPHAGOGASTRODUODENOSCOPY (EGD) WITH PROPOFOL;  Surgeon: Dolores Frame, MD;  [facility];  Service: Gastroenterology;;   GIVENS CAPSULE STUDY N/A 05/11/2022   Procedure: GIVENS CAPSULE STUDY;  Surgeon: Dolores Frame, MD;  Location: [facility];  Service: Gastroenterology;  Laterality: N/A;  7:30 am   HEMORRHOID SURGERY     HOT HEMOSTASIS  06/30/2022   Procedure: HOT HEMOSTASIS (ARGON PLASMA COAGULATION/BICAP);  Surgeon: Marguerita Merles, Reuel Boom, MD;  Location: [facility];  Service: Gastroenterology;;   POLYPECTOMY  11/09/2019   Procedure: POLYPECTOMY;  Surgeon: Dolores Frame, MD;  [facility]: [facility];  Service: Gastroenterology;;   POLYPECTOMY  02/27/2021   Procedure: POLYPECTOMY;  Surgeon: Dolores Frame, MD;  Location: [facility];  Service: Gastroenterology;;   POLYPECTOMY  04/05/2022   Procedure: POLYPECTOMY;  Surgeon: Napoleon Form, MD;  Location: [facility] ENDOSCOPY;  Service: Gastroenterology;;   TONSILLECTOMY     Allergies  Allergen Reactions   Morphine And Codeine Nausea And Vomiting   Prior to Admission medications   Medication Sig Start Date End Date Taking? Authorizing Provider  ACCU-CHEK AVIVA PLUS test strip USE AS INSTRUCTED 06/13/19  Yes Corum, Minerva Fester, MD  aspirin EC 81 MG tablet Take 1 tablet (81 mg total) by mouth daily. 04/08/22  Yes Leroy Sea, MD  atorvastatin (LIPITOR) 80 MG tablet TAKE 1 TABLET BY MOUTH DAILY 04/26/22  Yes Shade Flood, MD  Calcium-Magnesium-Zinc (CAL-MAG-ZINC PO) Take 1 tablet by mouth daily.   Yes [provider]  ferrous sulfate 325 (65 FE) MG tablet Take 1 tablet (325 mg total) by mouth daily with breakfast. 06/30/22  Yes Marguerita Merles, Reuel Boom, MD  folic acid (FOLVITE) 1 MG tablet Take 1 tablet (1 mg total) by mouth daily. 04/05/22  Yes Leroy Sea, MD  furosemide (LASIX) 40 MG tablet Take 1 tablet (40 mg total) by mouth as needed for edema (swelling). 04/06/22  Yes Sharlene Dory, NP  gabapentin (NEURONTIN) 300 MG capsule TAKE 1 TO 2 CAPSULES BY MOUTH  TWICE DAILY 07/28/22  Yes Shade Flood, MD  lisinopril (ZESTRIL) 20 MG tablet Take 1 tablet (20 mg total) by  mouth daily. 02/23/21 10/16/78 Yes Shade Flood, MD  magnesium oxide (MAG-OX) 400 MG tablet Take 400 mg by mouth at bedtime.   Yes [provider]  metFORMIN (GLUCOPHAGE) 500 MG tablet TAKE 2 TABLETS BY MOUTH TWICE  DAILY 07/28/22  Yes Shade Flood, MD  metoprolol succinate (TOPROL-XL) 50 MG 24 hr tablet Take 1 tablet (50 mg total) by mouth daily. 06/09/22  Yes Strader, [place] M, PA-C  Multiple Vitamin (MULTIVITAMIN WITH MINERALS) TABS Take 1 tablet by mouth daily.   Yes [provider]  nitroGLYCERIN (NITROSTAT) 0.4 MG SL tablet Place 1 tablet (0.4 mg total) under the tongue every 5 (five) minutes x 3 doses as needed for chest pain (if no relief after 2nd dose, proceed to ED for an evaluation). 04/06/22  Yes Sharlene Dory, NP  pantoprazole (PROTONIX) 40 MG tablet Take 1 tablet (40 mg total) by mouth daily. 08/02/22  Yes Dolores Frame, MD   Social History   Socioeconomic History   Marital status: Married    Spouse name: Not on file   Number of children: Not on file   Years of education: Not on file   Highest education level: Not on file  Occupational History   Not on file  Tobacco Use   Smoking status: Every Day    Packs/day: 1.00    Years: 50.00    Additional pack years:  0.00    Total pack years: 50.00    Types: Cigarettes    Start date: 11/26/1967   Smokeless tobacco: Former   Tobacco comments:    smoking x 50 yrs  Vaping Use   Vaping Use: Never used  Substance and Sexual Activity   Alcohol use: Not Currently    Comment: rare   Drug use: Yes    Frequency: 5.0 times per week    Types: Marijuana    Comment: as needed   Sexual activity: Yes  Other Topics Concern   Not on file  Social History Narrative   Lives with wife   Right Handed   Drinks 2-3 cups caffeine daily   Social Determinants of Health   Financial Resource Strain: Low Risk  (01/20/2022)   Overall Financial Resource Strain (CARDIA)    Difficulty of Paying Living Expenses: Not hard at all  Food Insecurity: No Food Insecurity (04/06/2022)   Hunger Vital Sign    Worried About Running Out of Food in the Last Year: Never true    Ran Out of Food in the Last Year: Never true  Transportation Needs: No Transportation Needs (04/06/2022)   PRAPARE - Administrator, Civil Service (Medical): No    Lack of Transportation (Non-Medical): No  Recent Concern: Transportation Needs - Unmet Transportation Needs (04/06/2022)   PRAPARE - Transportation    Lack of Transportation (Medical): Yes    Lack of Transportation (Non-Medical): Yes  Physical Activity: Inactive (01/20/2022)   Exercise Vital Sign    Days of Exercise per Week: 0 days    Minutes of Exercise per Session: 0 min  Stress: No Stress Concern Present (01/20/2022)   [employer] of Occupational Health - Occupational Stress Questionnaire    Feeling of Stress : Not at all  Social Connections: Moderately Integrated (01/20/2022)   Social Connection and Isolation Panel [NHANES]    Frequency of Communication with Friends and Family: More than three times a week    Frequency of Social Gatherings with Friends and Family: More than three times a week  Attends Religious Services: Never    Active Member of Clubs or Organizations: No     Attends Banker Meetings: More than 4 times per year    Marital Status: Married  Catering manager Violence: Not At Risk (04/03/2022)   Humiliation, Afraid, Rape, and Kick questionnaire    Fear of Current or Ex-Partner: No    Emotionally Abused: No    Physically Abused: No    Sexually Abused: No    Review of Systems   Objective:   Vitals:   08/19/22 1053  BP: 130/74  Pulse: 78  Temp: 98.1 F (36.7 C)  TempSrc: Temporal  SpO2: 95%  Weight: 191 lb (86.6 kg)  Height: 5' 6.5" (1.689 m)     Physical Exam Vitals reviewed.  Constitutional:      Appearance: He is well-developed.  HENT:     Head: Normocephalic and atraumatic.  Neck:     Vascular: No carotid bruit or JVD.  Cardiovascular:     Rate and Rhythm: Normal rate and regular rhythm.     Heart sounds: Murmur (Grade 3-4 systolic.) heard.  Pulmonary:     Effort: Pulmonary effort is normal.     Breath sounds: Normal breath sounds. No rales.  Musculoskeletal:     Right lower leg: No edema.     Left lower leg: No edema.  Skin:    General: Skin is warm and dry.  Neurological:     Mental Status: He is alert and oriented to person, place, and time.  Psychiatric:        Mood and Affect: Mood normal.     Assessment & Plan:  [place] OVIDE CALANDRA is a 72 y.o. male . Fatigue, unspecified type - Plan: Comprehensive metabolic panel Insomnia, unspecified type  -Possibly multifactorial with anemia, cardiac history as above, but suspect component of insomnia and only 4 hours of sleep per night likely contributing.  Melatonin over-the-counter as initial trial, handout on insomnia and recheck in 1 month.  Keep follow-up with cardiology as planned, check CBC and iron as below.  AVM (arteriovenous malformation) of small bowel, acquired Iron deficiency anemia due to chronic blood loss - Plan: CBC, Iron, TIBC and Ferritin Panel  -Recent visit with gastroenterology as above, check updated CBC, ferritin, continue PPI and daily  iron.  PAD (peripheral artery disease) (HCC) Moderate aortic stenosis Diastolic congestive heart failure, unspecified HF chronicity (HCC) - Plan: Lipid panel  -Murmur with fatigue as above, concern for GI regarding his aortic stenosis BMs, has follow-up with cardiology tomorrow.  DM type 2 causing vascular disease (HCC) - Plan: Comprehensive metabolic panel, Hemoglobin A1c  -Continue metformin same dose for now, check updated labs with medication adjustments accordingly.  Elevated A1c previously.  Mixed hyperlipidemia - Plan: Lipid panel, Comprehensive metabolic panel  -Tolerating statin, check updated labs.  No med changes for now  Essential hypertension - Plan: Comprehensive metabolic panel  -Hypertension stable, cardiology follow-up as above.  No med changes at this time.  Check labs with adjustments accordingly    No orders of the defined types were placed in this encounter.  Patient Instructions  Take breaks in the heat and make sure to drink plenty of fluids. If any return of fatigue in the heat, be seen.  I will check some labs today. No med changes for now and follow up with cardiology tomorrow.   Fatigue could be due to decreased sleep.  Try melatonin before you go to sleep and see other information below on  sleep to see if that helps.  Recheck with me in 1 month. Return to the clinic or go to the nearest emergency room if any of your symptoms worsen or new symptoms occur.  Insomnia Insomnia is a sleep disorder that makes it difficult to fall asleep or stay asleep. Insomnia can cause fatigue, low energy, difficulty concentrating, mood swings, and poor performance at work or school. There are three different ways to classify insomnia: Difficulty falling asleep. Difficulty staying asleep. Waking up too early in the morning. Any type of insomnia can be long-term (chronic) or short-term (acute). Both are common. Short-term insomnia usually lasts for 3 months or less. Chronic  insomnia occurs at least three times a week for longer than 3 months. What are the causes? Insomnia may be caused by another condition, situation, or substance, such as: Having certain mental health conditions, such as anxiety and depression. Using caffeine, alcohol, tobacco, or drugs. Having gastrointestinal conditions, such as gastroesophageal reflux disease (GERD). Having certain medical conditions. These include: Asthma. Alzheimer's disease. Stroke. Chronic pain. An overactive thyroid gland (hyperthyroidism). Other sleep disorders, such as restless legs syndrome and sleep apnea. Menopause. Sometimes, the cause of insomnia may not be known. What increases the risk? Risk factors for insomnia include: Gender. Females are affected more often than males. Age. Insomnia is more common as people get older. Stress and certain medical and mental health conditions. Lack of exercise. Having an irregular work schedule. This may include working night shifts and traveling between different time zones. What are the signs or symptoms? If you have insomnia, the main symptom is having trouble falling asleep or having trouble staying asleep. This may lead to other symptoms, such as: Feeling tired or having low energy. Feeling nervous about going to sleep. Not feeling rested in the morning. Having trouble concentrating. Feeling irritable, anxious, or depressed. How is this diagnosed? This condition may be diagnosed based on: Your symptoms and medical history. Your health care provider may ask about: Your sleep habits. Any medical conditions you have. Your mental health. A physical exam. How is this treated? Treatment for insomnia depends on the cause. Treatment may focus on treating an underlying condition that is causing the insomnia. Treatment may also include: Medicines to help you sleep. Counseling or therapy. Lifestyle adjustments to help you sleep better. Follow these instructions at  home: Eating and drinking  Limit or avoid alcohol, caffeinated beverages, and products that contain nicotine and tobacco, especially close to bedtime. These can disrupt your sleep. Do not eat a large meal or eat spicy foods right before bedtime. This can lead to digestive discomfort that can make it hard for you to sleep. Sleep habits  Keep a sleep diary to help you and your health care provider figure out what could be causing your insomnia. Write down: When you sleep. When you wake up during the night. How well you sleep and how rested you feel the next day. Any side effects of medicines you are taking. What you eat and drink. Make your bedroom a dark, comfortable place where it is easy to fall asleep. Put up shades or blackout curtains to block light from outside. Use a white noise machine to block noise. Keep the temperature cool. Limit screen use before bedtime. This includes: Not watching TV. Not using your smartphone, tablet, or computer. Stick to a routine that includes going to bed and waking up at the same times every day and night. This can help you fall asleep faster. Consider making  a quiet activity, such as reading, part of your nighttime routine. Try to avoid taking naps during the day so that you sleep better at night. Get out of bed if you are still awake after 15 minutes of trying to sleep. Keep the lights down, but try reading or doing a quiet activity. When you feel sleepy, go back to bed. General instructions Take over-the-counter and prescription medicines only as told by your health care provider. Exercise regularly as told by your health care provider. However, avoid exercising in the hours right before bedtime. Use relaxation techniques to manage stress. Ask your health care provider to suggest some techniques that may work well for you. These may include: Breathing exercises. Routines to release muscle tension. Visualizing peaceful scenes. Make sure that you  drive carefully. Do not drive if you feel very sleepy. Keep all follow-up visits. This is important. Contact a health care provider if: You are tired throughout the day. You have trouble in your daily routine due to sleepiness. You continue to have sleep problems, or your sleep problems get worse. Get help right away if: You have thoughts about hurting yourself or someone else. Get help right away if you feel like you may hurt yourself or others, or have thoughts about taking your own life. Go to your nearest emergency room or: Call 911. Call the National Suicide Prevention Lifeline at (820) 853-9141 or 988. This is open 24 hours a day. Text the Crisis Text Line at 475-601-8949. Summary Insomnia is a sleep disorder that makes it difficult to fall asleep or stay asleep. Insomnia can be long-term (chronic) or short-term (acute). Treatment for insomnia depends on the cause. Treatment may focus on treating an underlying condition that is causing the insomnia. Keep a sleep diary to help you and your health care provider figure out what could be causing your insomnia. This information is not intended to replace advice given to you by your health care provider. Make sure you discuss any questions you have with your health care provider. Document Revised: 02/09/2021 Document Reviewed: 02/09/2021 Elsevier Patient Education  2024 Elsevier Inc.     Signed,   Meredith Staggers, MD [facility], [facility] 08/19/22 11:53 AM

## 2022-08-20 ENCOUNTER — Ambulatory Visit: Payer: Medicare Other | Attending: Cardiology | Admitting: Cardiology

## 2022-08-20 ENCOUNTER — Encounter: Payer: Self-pay | Admitting: Cardiology

## 2022-08-20 VITALS — BP 130/72 | HR 82 | Ht 66.0 in | Wt 191.0 lb

## 2022-08-20 DIAGNOSIS — E782 Mixed hyperlipidemia: Secondary | ICD-10-CM

## 2022-08-20 DIAGNOSIS — I480 Paroxysmal atrial fibrillation: Secondary | ICD-10-CM

## 2022-08-20 DIAGNOSIS — I2510 Atherosclerotic heart disease of native coronary artery without angina pectoris: Secondary | ICD-10-CM | POA: Diagnosis not present

## 2022-08-20 DIAGNOSIS — I350 Nonrheumatic aortic (valve) stenosis: Secondary | ICD-10-CM

## 2022-08-20 LAB — IRON,TIBC AND FERRITIN PANEL
%SAT: 15 % (calc) — ABNORMAL LOW (ref 20–48)
Ferritin: 15 ng/mL — ABNORMAL LOW (ref 24–380)
Iron: 53 ug/dL (ref 50–180)
TIBC: 349 mcg/dL (calc) (ref 250–425)

## 2022-08-20 NOTE — Patient Instructions (Addendum)
Medication Instructions:  Continue all current medications.   Labwork: none  Testing/Procedures: none  Follow-Up: 6 months   Any Other Special Instructions Will Be Listed Below (If Applicable).   If you need a refill on your cardiac medications before your next appointment, please call your pharmacy.  

## 2022-08-20 NOTE — Progress Notes (Signed)
Clinical Summary Mr. Chmura is a 72 y.o.male seen today for follow up of the following medical problems.      1. CAD - CT scan chest 02/2013 with 3 vessel CAD, severe LAD disease. - Jan 2015 nuclear stress without clear ischemia - admit 07/2015 with acute pulmonary edema in setting of severe HTN. Referred for cath.  - cath 07/2015 with 50% LAD disease, overall patent coronaries - his presentation with pulmonary edema likely secondary to severe HTN in setting of restrictive diastolic dysfunction. - echo 07/2015 LVEF 45-50%, restrictive diastolic dysfunction 08/2020 echo LVEF 60-65%   - recent chest pains, some SOB - started 2 days ago. Pressure epigastric/midchest, 3/10 in severity. Some associated SOB few seconds.. Lasted a few hours constant, not positional. No association with food or eating.  - no repeat episodes -compliant with meds.      02/2021 nuclear stress: small inferior infarct, no current ischemia - no chest pains, no SOB/DOE - compliant with meds   2.Afib -new diagnosis 05/2022 - recent GI bleeding, not started on anticoag - no recent palpitations.      2. Severe OSA - not using CPAP machine due to discomfort. Stopped several years ago     3. Hyperlipidemia -11/2018 TC 82 HDL 25 TG 137 LDL 35 - he is compliant with statin   - 08/2019 TC 106 HDL 37 TG 100 LDL 51  - 04/2020 TC 103 HDL 35 TG 143 LDL 45 - 11/2020 TC 87 HDL 35 LDL 38 TG 69  08/2022 TC 96 TG 135 HDL 34 LDL 35   4. Aortic stenosis - echo 07/2015 mild to moderate. Mean gradient 16, AVA reported at 0.9 with dimensionless index 0.17 Mar 2018 echo LVEF 60-65%, mild to moderate stenosis   08/2020 echo LVEF 60-65%, no WMAs, grade I dd, mild MR, mild AI, mild to mod AS mean grade 17, AVA VTI 1.12 DI 0.29 - no recent symptoms.   Jan 2024 echo: LVEF 55-60%, moderate AS mean grad 23 AVA VTI 0.90 DI 0.26 SVI 38      - seen by PA Strader, she discussed with hematology lab results for any form of von  Willebrand's disease. Labs show no evidence and therefore no evidence of Heyde syndrome.    5. HTN - compliant withmeds     6. Chronic diastolic HF - denies any LE edema, controlled with lasix   7. DM2  - followed by pcp     8. Claudication/PAD - followed by vascular   9. Dysphagia - episode of food getting stuck, reports prior issues along with esophageal dilatation in the past. Asking to reestablish with GI, reports also due for colonscopy   10.Neuropathy   11. Prostate cancer - followed by urology   12. GI bleed - followed by GI, issues with AVMs.   AAA screen: 2017 [place] was normal   SH: his wife, Savvas Shamberger is also a patient of mine. 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  Past Medical History:  Diagnosis Date   Abnormal cardiovascular stress test 03/2013   Anxiety    Arthritis    Back pain    Cancer (HCC)    Prostate cancer-no treatment per pt   CHF (congestive heart failure) (HCC)    Coronary artery disease    Multivessel with significant LAD involvement by chest CT 2014   Depression  Diastolic dysfunction    Essential hypertension    Lumbar herniated disc    L4-L5   MRSA (methicillin resistant staph aureus) culture positive    Myocardial infarction (HCC)    Neuropathy    Shortness of breath dyspnea    Sleep apnea    No cpap   Type 2 diabetes mellitus (HCC)      Allergies  Allergen Reactions   Morphine And Codeine Nausea And Vomiting     Current Outpatient Medications  Medication Sig Dispense Refill   ACCU-CHEK AVIVA PLUS test strip USE AS INSTRUCTED 100 strip 3   aspirin EC 81 MG tablet Take 1 tablet (81 mg total) by mouth daily. 30 tablet 11   atorvastatin (LIPITOR) 80 MG tablet TAKE 1 TABLET BY MOUTH DAILY 90 tablet 3   Calcium-Magnesium-Zinc (CAL-MAG-ZINC PO) Take 1 tablet by mouth daily.     ferrous sulfate 325 (65 FE) MG tablet Take 1 tablet  (325 mg total) by mouth daily with breakfast. 90 tablet 3   folic acid (FOLVITE) 1 MG tablet Take 1 tablet (1 mg total) by mouth daily. 30 tablet 0   furosemide (LASIX) 40 MG tablet Take 1 tablet (40 mg total) by mouth as needed for edema (swelling).     gabapentin (NEURONTIN) 300 MG capsule TAKE 1 TO 2 CAPSULES BY MOUTH  TWICE DAILY 360 capsule 3   lisinopril (ZESTRIL) 20 MG tablet Take 1 tablet (20 mg total) by mouth daily. 90 tablet 3   magnesium oxide (MAG-OX) 400 MG tablet Take 400 mg by mouth at bedtime.     metFORMIN (GLUCOPHAGE) 500 MG tablet TAKE 2 TABLETS BY MOUTH TWICE  DAILY 360 tablet 3   metoprolol succinate (TOPROL-XL) 50 MG 24 hr tablet Take 1 tablet (50 mg total) by mouth daily. 90 tablet 3   Multiple Vitamin (MULTIVITAMIN WITH MINERALS) TABS Take 1 tablet by mouth daily.     nitroGLYCERIN (NITROSTAT) 0.4 MG SL tablet Place 1 tablet (0.4 mg total) under the tongue every 5 (five) minutes x 3 doses as needed for chest pain (if no relief after 2nd dose, proceed to ED for an evaluation). 25 tablet 3   pantoprazole (PROTONIX) 40 MG tablet Take 1 tablet (40 mg total) by mouth daily. 90 tablet 3   No current facility-administered medications for this visit.     Past Surgical History:  Procedure Laterality Date   ANTERIOR CERVICAL DECOMP/DISCECTOMY FUSION  01/18/2011   Procedure: ANTERIOR CERVICAL DECOMPRESSION/DISCECTOMY FUSION 2 LEVELS;  Surgeon: Kathaleen Maser Pool;  Location: [facility];  Service: Neurosurgery;  Laterality: N/A;  anterior cervical discectomy and fusion with allograft and plating, cervical five-six, cervical six-seven   BIOPSY N/A 04/04/2015   Procedure: BIOPSY;  Surgeon: Malissa Hippo, MD;  Location: [facility];  Service: Endoscopy;  Laterality: N/A;   BIOPSY  11/09/2019   Procedure: BIOPSY;  Surgeon: Dolores Frame, MD;  Location: [facility];  Service: Gastroenterology;;   Bone spur removed  1997   Left shoulder   CARDIAC CATHETERIZATION N/A  08/08/2015   Procedure: Right/Left Heart Cath and Coronary Angiography;  Surgeon: Tonny Bollman, MD;  Location: [facility];  Service: Cardiovascular;  Laterality: N/A;   CARPAL TUNNEL RELEASE Right    COLONOSCOPY WITH PROPOFOL N/A 11/09/2019   Procedure: COLONOSCOPY WITH PROPOFOL;  Surgeon: Dolores Frame, MD;  Location: [facility];  Service: Gastroenterology;  Laterality: N/A;  900   COLONOSCOPY WITH PROPOFOL N/A 02/27/2021   Procedure:  COLONOSCOPY WITH PROPOFOL;  Surgeon: Marguerita Merles, Reuel Boom, MD;  Location: [facility];  Service: Gastroenterology;  Laterality: N/A;  7:30   COLONOSCOPY WITH PROPOFOL N/A 04/05/2022   Procedure: COLONOSCOPY WITH PROPOFOL;  Surgeon: Napoleon Form, MD;  Location: [facility];  Service: Gastroenterology;  Laterality: N/A;   ENTEROSCOPY N/A 06/30/2022   Procedure: ENTEROSCOPY;  Surgeon: Dolores Frame, MD;  Location: [facility];  Service: Gastroenterology;  Laterality: N/A;  2:00pm;ASA 3   ESOPHAGEAL DILATION N/A 04/04/2015   Procedure: ESOPHAGEAL DILATION;  Surgeon: Malissa Hippo, MD;  Location: [facility];  Service: Endoscopy;  Laterality: N/A;   ESOPHAGEAL DILATION N/A 11/09/2019   Procedure: ESOPHAGEAL DILATION;  Surgeon: Dolores Frame, MD;  Location: [facility];  Service: Gastroenterology;  Laterality: N/A;   ESOPHAGOGASTRODUODENOSCOPY N/A 04/04/2015   Procedure: ESOPHAGOGASTRODUODENOSCOPY (EGD);  Surgeon: Malissa Hippo, MD;  Location: [facility];  Service: Endoscopy;  Laterality: N/A;  1240   ESOPHAGOGASTRODUODENOSCOPY (EGD) WITH PROPOFOL N/A 11/09/2019   Procedure: ESOPHAGOGASTRODUODENOSCOPY (EGD) WITH PROPOFOL;  Surgeon: Dolores Frame, MD;  [facility]: [facility];  Service: Gastroenterology;  Laterality: N/A;   ESOPHAGOGASTRODUODENOSCOPY (EGD) WITH PROPOFOL N/A 04/04/2022   Procedure: ESOPHAGOGASTRODUODENOSCOPY (EGD) WITH PROPOFOL;  Surgeon: Benancio Deeds, MD;  Location: [facility]  ENDOSCOPY;  Service: Gastroenterology;  Laterality: N/A;   ESOPHAGOGASTRODUODENOSCOPY (EGD) WITH PROPOFOL  06/30/2022   Procedure: ESOPHAGOGASTRODUODENOSCOPY (EGD) WITH PROPOFOL;  Surgeon: Dolores Frame, MD;  [facility]: [facility];  Service: Gastroenterology;;   GIVENS CAPSULE STUDY N/A 05/11/2022   Procedure: GIVENS CAPSULE STUDY;  Surgeon: Dolores Frame, MD;  Location: [facility];  Service: Gastroenterology;  Laterality: N/A;  7:30 am   HEMORRHOID SURGERY     HOT HEMOSTASIS  06/30/2022   Procedure: HOT HEMOSTASIS (ARGON PLASMA COAGULATION/BICAP);  Surgeon: Marguerita Merles, Reuel Boom, MD;  Location: [facility];  Service: Gastroenterology;;   POLYPECTOMY  11/09/2019   Procedure: POLYPECTOMY;  Surgeon: Dolores Frame, MD;  [facility]: [facility];  Service: Gastroenterology;;   POLYPECTOMY  02/27/2021   Procedure: POLYPECTOMY;  Surgeon: Dolores Frame, MD;  Location: [facility];  Service: Gastroenterology;;   POLYPECTOMY  04/05/2022   Procedure: POLYPECTOMY;  Surgeon: Napoleon Form, MD;  Location: [facility] ENDOSCOPY;  Service: Gastroenterology;;   TONSILLECTOMY       Allergies  Allergen Reactions   Morphine And Codeine Nausea And Vomiting      Family History  Problem Relation Age of Onset   Lung cancer Mother    Diabetes Father    Hepatitis B Father    Diabetes Sister    Cushing syndrome Sister      Social History Mr. Mcevoy reports that he has been smoking cigarettes. He started smoking about 54 years ago. He has a 50.00 pack-year smoking history. He has quit using smokeless tobacco. Mr. Levenhagen reports that he does not currently use alcohol.   Review of Systems CONSTITUTIONAL: No weight loss, fever, chills, weakness or fatigue.  HEENT: Eyes: No visual loss, blurred vision, double vision or yellow sclerae.No hearing loss, sneezing, congestion, runny nose or sore throat.  SKIN: No rash or itching.  CARDIOVASCULAR: per  hpi RESPIRATORY: No shortness of breath, cough or sputum.  GASTROINTESTINAL: No anorexia, nausea, vomiting or diarrhea. No abdominal pain or blood.  GENITOURINARY: No burning on urination, no polyuria NEUROLOGICAL: No headache, dizziness, syncope, paralysis, ataxia, numbness or tingling in the extremities. No change in bowel or bladder control.  MUSCULOSKELETAL: No muscle,  back pain, joint pain or stiffness.  LYMPHATICS: No enlarged nodes. No history of splenectomy.  PSYCHIATRIC: No history of depression or anxiety.  ENDOCRINOLOGIC: No reports of sweating, cold or heat intolerance. No polyuria or polydipsia.  Marland Kitchen   Physical Examination Today's Vitals   08/20/22 0811  BP: 130/72  Pulse: 82  SpO2: 94%  Weight: 191 lb (86.6 kg)  Height: 5\' 6"  (1.676 m)   Body mass index is 30.83 kg/m.  Gen: resting comfortably, no acute distress HEENT: no scleral icterus, pupils equal round and reactive, no palptable cervical adenopathy,  CV: irreg, 3/6 systolic murmur rusb, no jvd Resp: Clear to auscultation bilaterally GI: abdomen is soft, non-tender, non-distended, normal bowel sounds, no hepatosplenomegaly MSK: extremities are warm, no edema.  Skin: warm, no rash Neuro:  no focal deficits Psych: appropriate affect   Diagnostic Studies  Jan 2015 MPI IMPRESSION: 1.  Abnormal exercise myocardial perfusion imaging stress test   2. Inferior wall defect likely due to subdiaphragmatic attenuation, its wall motion is similar to other areas without a perfusion defect.   3. Likely small anteroseptal infract, this area is more hypokinetic than the rest of the myocardium   3.  Low left ventricular systolic function with global hypokinesis   4. Mildly reduced functional capacity (90% of age and gender predicted)   5. Overall increased risk for major cardiac events based on low ejection fraction. There is no current myocardium at jeopardy.     02/2013 echo Study Conclusions  - Left  ventricle: The cavity size was normal. There was mild   concentric hypertrophy. Systolic function was normal. The   estimated ejection fraction was in the range of 55% to   60%. Wall motion was normal; there were no regional wall   motion abnormalities. There is mildly asynchronous   contraction consistent with intraventricular conduction   delay (IVCD) or bundle Julliette Frentz block. Doppler parameters   are consistent with abnormal left ventricular relaxation   (grade 1 diastolic dysfunction). - Aortic valve: Cusp separation was mildly reduced. There   was mild to moderate stenosis. - Left atrium: The atrium was moderately dilated.   07/2015 echo   Study Conclusions   - Left ventricle: The cavity size was normal. Wall thickness was   increased in a pattern of mild LVH. Systolic function was mildly   reduced. The estimated ejection fraction was in the range of 45%   to 50%. Diffuse hypokinesis. Doppler parameters are consistent   with restrictive physiology, indicative of decreased left   ventricular diastolic compliance and/or increased left atrial   pressure. Doppler parameters are consistent with high ventricular   filling pressure. - Aortic valve: Valve mobility was restricted. There was mild to   moderate stenosis. Valve area (VTI): 0.96 cm^2. Valve area   (Vmax): 0.91 cm^2. Valve area (Vmean): 0.89 cm^2. - Mitral valve: Calcified annulus. There was mild regurgitation. - Left atrium: The atrium was moderately dilated. - Right atrium: The atrium was mildly dilated. - Atrial septum: There was a patent foramen ovale.   Impressions:   - Mild global reduction in LV function; restrictive filling with   elevated filling pressure; mild LVH; biatrial enlargement;   heavily calcified aortic valve with probable mild to moderate AS   (mean gradient 16 mmHg; calculated AVA .9 cm2 likely   overestimates severity; dimensionless index .3 not supportive of   severe AS); mild MR; patent foramen  ovale.   07/2015 Cath Prox LAD to Mid LAD lesion, 50% stenosed.  1. Calcified moderate proximal-mid LAD stenosis 2. Widely patent, dominant LCx 3. Preserved cardiac output, mildly elevated right-sided intracardiac pressures   Recommend: Medical therapy for nonobstructive CAD and CHF   01/2016 AAA [place] Screen No aneurysm   08/2020 echo   IMPRESSIONS     1. Left ventricular ejection fraction, by estimation, is 60 to 65%. The  left ventricle has normal function. The left ventricle has no regional  wall motion abnormalities. There is moderate left ventricular hypertrophy.  Left ventricular diastolic  parameters are consistent with Grade I diastolic dysfunction (impaired  relaxation). Elevated left atrial pressure. The average left ventricular  global longitudinal strain is -17.6 %.   2. Right ventricular systolic function is normal. The right ventricular  size is normal.   3. The mitral valve is abnormal. Mild mitral valve regurgitation. No  evidence of mitral stenosis.   4. The aortic valve has an indeterminant number of cusps. There is  moderate calcification of the aortic valve. There is moderate thickening  of the aortic valve. Aortic valve regurgitation is mild. Mild to moderate  aortic valve stenosis. Mild to moderate   aortic stenosis is present. Aortic valve mean gradient measures 17.0  mmHg. Aortic valve peak gradient measures 26.7 mmHg. Aortic valve area, by  VTI measures 1.12 cm.    02/2021 nuclear stress  Findings are consistent with prior myocardial infarction. The study is low risk.   1 mm down sloping ST depression in the inferolateral leads was noted with exercise.  Patient did not achieve target HR and was switched to Lexiscan.  There was no significant ST deviation during Lexiscan infusion.   Left ventricular function is abnormal. Global function is mildly reduced. Nuclear stress EF: 50 %. The left ventricular ejection fraction is mildly decreased (45-54%). End  diastolic cavity size is mildly enlarged.   Abnormal myovue with motion and diaphragmatic attenuation Possible small inferior wall infarct from apex to base No ischemia EF 50% with inferior and apical hypokinesis        Assessment and Plan   1. CAD - moderate nonobstructive disease by prior cath.  - recent low risk stress test -no symptoms, continue current meds     2. Hyperlipidemia - he is at goal, continue current meds  3. Aortic stenosis -moderate borderline severe AS - asymptomatic, normal LVEF - continue to monitor - no evidence of vWF disease to connect his aortic stenosis to prior small bowel bleeding AVMs   4. HTN -he is at goal, continue current meds   5. Chronic diasotlic heart failure -euvolemic without symptoms, continue current meds   6.Afib - no symptoms, continue rate control - had not started anticoag due to recent GI bleed, will reach out to GI about potentially starting.  - EKG today shows rate controlled afib      Antoine Poche, M.D.

## 2022-09-06 ENCOUNTER — Encounter: Payer: Self-pay | Admitting: Urology

## 2022-09-06 ENCOUNTER — Ambulatory Visit: Payer: Medicare Other | Admitting: Urology

## 2022-09-06 VITALS — BP 128/74 | HR 88

## 2022-09-06 DIAGNOSIS — R351 Nocturia: Secondary | ICD-10-CM | POA: Diagnosis not present

## 2022-09-06 DIAGNOSIS — C61 Malignant neoplasm of prostate: Secondary | ICD-10-CM | POA: Diagnosis not present

## 2022-09-06 DIAGNOSIS — N5201 Erectile dysfunction due to arterial insufficiency: Secondary | ICD-10-CM

## 2022-09-06 NOTE — Patient Instructions (Signed)

## 2022-09-06 NOTE — Progress Notes (Signed)
09/06/2022 2:56 PM   Jonathan Gilmore 1950-07-04 [id number]  Referring provider: Shade Flood, MD [address] [place],  [place] [address]  Followup prostate cancer   HPI: Jonathan Gilmore is a 71yo here for followup for prostate cancer. He is on active surveillance. He underwent prostate MRI which showed 2 PIRADs 3 lesions. IPSS 4 QOL 0 on no BPH therapy. No recent PSA   PMH: Past Medical History:  Diagnosis Date   Abnormal cardiovascular stress test 03/2013   Anxiety    Arthritis    Back pain    Cancer (HCC)    Prostate cancer-no treatment per pt   CHF (congestive heart failure) (HCC)    Coronary artery disease    Multivessel with significant LAD involvement by chest CT 2014   Depression    Diastolic dysfunction    Essential hypertension    Lumbar herniated disc    L4-L5   MRSA (methicillin resistant staph aureus) culture positive    Myocardial infarction (HCC)    Neuropathy    Shortness of breath dyspnea    Sleep apnea    No cpap   Type 2 diabetes mellitus [facility])     Surgical History: Past Surgical History:  Procedure Laterality Date   ANTERIOR CERVICAL DECOMP/DISCECTOMY FUSION  01/18/2011   Procedure: ANTERIOR CERVICAL DECOMPRESSION/DISCECTOMY FUSION 2 LEVELS;  Surgeon: Kathaleen Maser Pool;  Location: [facility];  Service: Neurosurgery;  Laterality: N/A;  anterior cervical discectomy and fusion with allograft and plating, cervical five-six, cervical six-seven   BIOPSY N/A 04/04/2015   Procedure: BIOPSY;  Surgeon: Malissa Hippo, MD;  Location: [facility];  Service: Endoscopy;  Laterality: N/A;   BIOPSY  11/09/2019   Procedure: BIOPSY;  Surgeon: Dolores Frame, MD;  Location: [facility];  Service: Gastroenterology;;   Bone spur removed  1997   Left shoulder   CARDIAC CATHETERIZATION N/A 08/08/2015   Procedure: Right/Left Heart Cath and Coronary Angiography;  Surgeon: Tonny Bollman, MD;  Location: [facility] INVASIVE CV LAB;  Service: Cardiovascular;   Laterality: N/A;   CARPAL TUNNEL RELEASE Right    COLONOSCOPY WITH PROPOFOL N/A 11/09/2019   Procedure: COLONOSCOPY WITH PROPOFOL;  Surgeon: Dolores Frame, MD;  Location: [facility];  Service: Gastroenterology;  Laterality: N/A;  900   COLONOSCOPY WITH PROPOFOL N/A 02/27/2021   Procedure: COLONOSCOPY WITH PROPOFOL;  Surgeon: Dolores Frame, MD;  [facility]: [facility];  Service: Gastroenterology;  Laterality: N/A;  7:30   COLONOSCOPY WITH PROPOFOL N/A 04/05/2022   Procedure: COLONOSCOPY WITH PROPOFOL;  Surgeon: Napoleon Form, MD;  Location: [facility] ENDOS[facility];  Service: Gastroenterology;  Laterality: N/A;   ENTEROSCOPY N/A 06/30/2022   Procedure: ENTEROSCOPY;  Surgeon: Dolores Frame, MD;  [facility]: [facility];  Service: Gastroenterology;  Laterality: N/A;  2:00pm;ASA 3   ESOPHAGEAL DILATION N/A 04/04/2015   Procedure: ESOPHAGEAL DILATION;  Surgeon: Malissa Hippo, MD;  [facility];  Service: Endoscopy;  Laterality: N/A;   ESOPHAGEAL DILATION N/A 11/09/2019   Procedure: ESOPHAGEAL DILATION;  Surgeon: Dolores Frame, MD;  [facility];  Service: Gastroenterology;  Laterality: N/A;   ESOPHAGOGASTRODUODENOSCOPY N/A 04/04/2015   Procedure: ESOPHAGOGASTRODUODENOSCOPY (EGD);  Surgeon: Malissa Hippo, MD;  [facility];  Service: Endoscopy;  Laterality: N/A;  1240   ESOPHAGOGASTRODUODENOSCOPY (EGD) WITH PROPOFOL N/A 11/09/2019   Procedure: ESOPHAGOGASTRODUODENOSCOPY (EGD) WITH PROPOFOL;  Surgeon: Dolores Frame, MD;  [facility];  Service: Gastroenterology;  Laterality: N/A;   ESOPHAGOGASTRODUODENOSCOPY (EGD) WITH PROPOFOL N/A 04/04/2022   Procedure: ESOPHAGOGASTRODUODENOSCOPY (EGD) WITH PROPOFOL;  Surgeon: Benancio Deeds, MD;  Location: [facility] ENDOSCOPY;  Service: Gastroenterology;  Laterality: N/A;   ESOPHAGOGASTRODUODENOSCOPY (EGD) WITH PROPOFOL  06/30/2022   Procedure: ESOPHAGOGASTRODUODENOSCOPY  (EGD) WITH PROPOFOL;  Surgeon: Dolores Frame, MD;  [facility];  Service: Gastroenterology;;   GIVENS CAPSULE STUDY N/A 05/11/2022   Procedure: GIVENS CAPSULE STUDY;  Surgeon: Dolores Frame, MD;  [facility];  Service: Gastroenterology;  Laterality: N/A;  7:30 am   HEMORRHOID SURGERY     HOT HEMOSTASIS  06/30/2022   Procedure: HOT HEMOSTASIS (ARGON PLASMA COAGULATION/BICAP);  Surgeon: Marguerita Merles, Reuel Boom, MD;  Location: [facility];  Service: Gastroenterology;;   POLYPECTOMY  11/09/2019   Procedure: POLYPECTOMY;  Surgeon: Dolores Frame, MD;  [facility];  Service: Gastroenterology;;   POLYPECTOMY  02/27/2021   Procedure: POLYPECTOMY;  Surgeon: Dolores Frame, MD;  [facility];  Service: Gastroenterology;;   POLYPECTOMY  04/05/2022   Procedure: POLYPECTOMY;  Surgeon: Napoleon Form, MD;  Location: [facility] ENDOSCOPY;  Service: Gastroenterology;;   TONSILLECTOMY      Home Medications:  Allergies as of 09/06/2022       Reactions   Morphine And Codeine Nausea And Vomiting        Medication List        Accurate as of September 06, 2022  2:56 PM. If you have any questions, ask your nurse or doctor.          Accu-Chek Aviva Plus test strip Generic drug: glucose blood USE AS INSTRUCTED   aspirin EC 81 MG tablet Take 1 tablet (81 mg total) by mouth daily.   atorvastatin 80 MG tablet Commonly known as: LIPITOR TAKE 1 TABLET BY MOUTH DAILY   CAL-MAG-ZINC PO Take 1 tablet by mouth daily.   ferrous sulfate 325 (65 FE) MG tablet Take 1 tablet (325 mg total) by mouth daily with breakfast.   folic acid 1 MG tablet Commonly known as: FOLVITE Take 1 tablet (1 mg total) by mouth daily.   furosemide 40 MG tablet Commonly known as: LASIX Take 1 tablet (40 mg total) by mouth as needed for edema (swelling).   gabapentin 300 MG capsule Commonly known as: NEURONTIN TAKE 1 TO 2 CAPSULES BY  MOUTH  TWICE DAILY   lisinopril 20 MG tablet Commonly known as: ZESTRIL Take 1 tablet (20 mg total) by mouth daily.   magnesium oxide 400 MG tablet Commonly known as: MAG-OX Take 400 mg by mouth at bedtime.   metFORMIN 500 MG tablet Commonly known as: GLUCOPHAGE TAKE 2 TABLETS BY MOUTH TWICE  DAILY   metoprolol succinate 50 MG 24 hr tablet Commonly known as: TOPROL-XL Take 1 tablet (50 mg total) by mouth daily.   multivitamin with minerals Tabs tablet Take 1 tablet by mouth daily.   nitroGLYCERIN 0.4 MG SL tablet Commonly known as: NITROSTAT Place 1 tablet (0.4 mg total) under the tongue every 5 (five) minutes x 3 doses as needed for chest pain (if no relief after 2nd dose, proceed to ED for an evaluation).   pantoprazole 40 MG tablet Commonly known as: Protonix Take 1 tablet (40 mg total) by mouth daily.        Allergies:  Allergies  Allergen Reactions   Morphine And Codeine Nausea And Vomiting    Family History: Family History  Problem Relation Age of Onset   Lung  cancer Mother    Diabetes Father    Hepatitis B Father    Diabetes Sister    Cushing syndrome Sister     Social History:  reports that he has been smoking cigarettes. He started smoking about 54 years ago. He has a 50.00 pack-year smoking history. He has quit using smokeless tobacco. He reports that he does not currently use alcohol. He reports current drug use. Frequency: 5.00 times per week. Drug: Marijuana.  ROS: All other review of systems were reviewed and are negative except what is noted above in HPI  Physical Exam: BP 128/74   Pulse 88   Constitutional:  Alert and oriented, No acute distress. HEENT: [place] AT, moist mucus membranes.  Trachea midline, no masses. Cardiovascular: No clubbing, cyanosis, or edema. Respiratory: Normal respiratory effort, no increased work of breathing. GI: Abdomen is soft, nontender, nondistended, no abdominal masses GU: No CVA tenderness.  Lymph: No cervical  or inguinal lymphadenopathy. Skin: No rashes, bruises or suspicious lesions. Neurologic: Grossly intact, no focal deficits, moving all 4 extremities. Psychiatric: Normal mood and affect.  Laboratory Data: Lab Results  Component Value Date   WBC 7.0 08/19/2022   HGB 13.9 08/19/2022   HCT 43.4 08/19/2022   MCV 82.2 08/19/2022   PLT 239.0 08/19/2022    Lab Results  Component Value Date   CREATININE 0.68 08/19/2022    Lab Results  Component Value Date   PSA 3.85 12/01/2020   PSA 2.1 02/26/2019    Lab Results  Component Value Date   TESTOSTERONE 241.06 (L) 12/01/2020    Lab Results  Component Value Date   HGBA1C 8.4 (H) 08/19/2022    Urinalysis    Component Value Date/Time   COLORURINE STRAW (A) 01/16/2018 0938   APPEARANCEUR Clear 09/07/2021 1141   LABSPEC 1.008 01/16/2018 0938   PHURINE 7.0 01/16/2018 0938   GLUCOSEU Negative 09/07/2021 1141   HGBUR NEGATIVE 01/16/2018 0938   BILIRUBINUR Negative 09/07/2021 1141   KETONESUR NEGATIVE 01/16/2018 0938   PROTEINUR Negative 09/07/2021 1141   PROTEINUR NEGATIVE 01/16/2018 0938   UROBILINOGEN 0.2 08/23/2011 0807   NITRITE Negative 09/07/2021 1141   NITRITE NEGATIVE 01/16/2018 0938   LEUKOCYTESUR Negative 09/07/2021 1141    Lab Results  Component Value Date   LABMICR Comment 09/07/2021    Pertinent Imaging: Prostate MRI: Images reviewed and discussed with the patient  No results found for this or any previous visit.  No results found for this or any previous visit.  No results found for this or any previous visit.  No results found for this or any previous visit.  No results found for this or any previous visit.  No valid procedures specified. No results found for this or any previous visit.  No results found for this or any previous visit.   Assessment & Plan:    1. Prostate cancer (HCC) -PSA today -followup 6 months with a PSA - Urinalysis, Routine w reflex microscopic  2. Nocturia -patient  defers therapy at this time  3. Erectile dysfunction due to arterial insufficiency Patient defers therapy at this time   No follow-ups on file.  Wilkie Aye, MD  [facility] [place]

## 2022-09-12 ENCOUNTER — Encounter: Payer: Self-pay | Admitting: Urology

## 2022-09-14 ENCOUNTER — Ambulatory Visit: Payer: Medicare Other | Admitting: Student

## 2022-09-20 ENCOUNTER — Ambulatory Visit: Payer: Medicare Other | Admitting: Family Medicine

## 2022-10-04 ENCOUNTER — Telehealth (INDEPENDENT_AMBULATORY_CARE_PROVIDER_SITE_OTHER): Payer: Self-pay | Admitting: *Deleted

## 2022-10-04 NOTE — Telephone Encounter (Signed)
Patient is on recall for 3 yr EGD - he just had one 03/2022 by Dr Leone Payor and then a small bowel endoscopy by you 06/30/22 - does he need this recall for the EGD - please advise

## 2022-10-04 NOTE — Telephone Encounter (Signed)
He does not, can cancel the recall for the EGD

## 2022-10-05 ENCOUNTER — Other Ambulatory Visit: Payer: Self-pay | Admitting: *Deleted

## 2022-10-05 DIAGNOSIS — I350 Nonrheumatic aortic (valve) stenosis: Secondary | ICD-10-CM

## 2022-10-05 NOTE — Telephone Encounter (Signed)
Recall  canceled

## 2022-10-06 ENCOUNTER — Ambulatory Visit: Payer: Medicare Other | Admitting: Family Medicine

## 2022-10-13 ENCOUNTER — Encounter: Payer: Self-pay | Admitting: Family Medicine

## 2022-10-13 ENCOUNTER — Ambulatory Visit (INDEPENDENT_AMBULATORY_CARE_PROVIDER_SITE_OTHER): Payer: Medicare Other | Admitting: Family Medicine

## 2022-10-13 VITALS — BP 126/74 | HR 87 | Temp 98.0°F | Ht 66.0 in | Wt 183.6 lb

## 2022-10-13 DIAGNOSIS — Z7984 Long term (current) use of oral hypoglycemic drugs: Secondary | ICD-10-CM

## 2022-10-13 DIAGNOSIS — E1159 Type 2 diabetes mellitus with other circulatory complications: Secondary | ICD-10-CM | POA: Diagnosis not present

## 2022-10-13 DIAGNOSIS — W19XXXA Unspecified fall, initial encounter: Secondary | ICD-10-CM

## 2022-10-13 DIAGNOSIS — M25551 Pain in right hip: Secondary | ICD-10-CM

## 2022-10-13 DIAGNOSIS — M79604 Pain in right leg: Secondary | ICD-10-CM | POA: Diagnosis not present

## 2022-10-13 MED ORDER — EMPAGLIFLOZIN 10 MG PO TABS
10.0000 mg | ORAL_TABLET | Freq: Every day | ORAL | 2 refills | Status: DC
Start: 2022-10-13 — End: 2022-12-21
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
## 2022-10-13 NOTE — Patient Instructions (Addendum)
Tylenol over the counter as needed for now, goal of decreasing use of ibuprofen.  Xray of hip and leg today at location below.  Depending on results can discuss next step.  Try to avoid weight to that leg is much as possible for now.  Over-the-counter muscle rubs or topical treatments are fine as well if needed.  [facility] or xray: Walk in 8:30-4:30 during weekdays, no appointment needed [address]  [place], [place] [address]  For diabetes, will try adding new medicine Jardiance.  Expect you will tolerate that well but watch for any signs of urinary tract infection like burning or frequent urination or yeast infection like new rash around the groin.  Be seen if the symptoms occur.  Recheck in 1 month to review meds and see how this change is doing.  Follow-up sooner if new symptoms, or if like symptoms or not continuing to improve.  Return to the clinic or go to the nearest emergency room if any of your symptoms worsen or new symptoms occur.

## 2022-11-17 ENCOUNTER — Ambulatory Visit (INDEPENDENT_AMBULATORY_CARE_PROVIDER_SITE_OTHER): Payer: Medicare Other | Admitting: *Deleted

## 2022-11-17 DIAGNOSIS — Z0000 Encounter for general adult medical examination without abnormal findings: Secondary | ICD-10-CM

## 2022-11-17 NOTE — Progress Notes (Signed)
Subjective:   Jonathan Gilmore is a 72 y.o. male who presents for Medicare Annual/Subsequent preventive examination.  Visit Complete: Virtual  I connected with  Jonathan Gilmore on 11/17/22 by a audio enabled telemedicine application and verified that I am speaking with the correct person using two identifiers.  Patient Location: Home  Provider Location: Home Office  I discussed the limitations of evaluation and management by telemedicine. The patient expressed understanding and agreed to proceed.  Vital Signs: Unable to obtain new vitals due to this being a telehealth visit.   Review of Systems           Objective:    Today's Vitals   11/17/22 1330  PainSc: 6    There is no height or weight on file to calculate BMI.     11/17/2022    2:06 PM 06/30/2022   12:14 PM 06/08/2022   12:33 PM 06/08/2022   12:31 PM 06/08/2022   12:30 PM 04/03/2022    6:28 PM 04/02/2022    7:19 PM  Advanced Directives  Does Patient Have a Medical Advance Directive? No Yes No No No Yes No  Type of Special educational needs teacher of [place];Living will    Living will   Does patient want to make changes to medical advance directive?      No - Patient declined   Copy of Healthcare Power of Attorney in Chart?  No - copy requested       Would patient like information on creating a medical advance directive? No - Patient declined  No - Patient declined No - Patient declined No - Patient declined      Current Medications (verified) Outpatient Encounter Medications as of 11/17/2022  Medication Sig   ACCU-CHEK AVIVA PLUS test strip USE AS INSTRUCTED   aspirin EC 81 MG tablet Take 1 tablet (81 mg total) by mouth daily.   atorvastatin (LIPITOR) 80 MG tablet TAKE 1 TABLET BY MOUTH DAILY   Calcium-Magnesium-Zinc (CAL-MAG-ZINC PO) Take 1 tablet by mouth daily.   empagliflozin (JARDIANCE) 10 MG TABS tablet Take 1 tablet (10 mg total) by mouth daily before breakfast.   ferrous sulfate 325 (65 FE) MG tablet Take  1 tablet (325 mg total) by mouth daily with breakfast.   folic acid (FOLVITE) 1 MG tablet Take 1 tablet (1 mg total) by mouth daily.   furosemide (LASIX) 40 MG tablet Take 1 tablet (40 mg total) by mouth as needed for edema (swelling).   gabapentin (NEURONTIN) 300 MG capsule TAKE 1 TO 2 CAPSULES BY MOUTH  TWICE DAILY   lisinopril (ZESTRIL) 20 MG tablet Take 1 tablet (20 mg total) by mouth daily.   magnesium oxide (MAG-OX) 400 MG tablet Take 400 mg by mouth at bedtime.   metFORMIN (GLUCOPHAGE) 500 MG tablet TAKE 2 TABLETS BY MOUTH TWICE  DAILY   metoprolol succinate (TOPROL-XL) 50 MG 24 hr tablet Take 1 tablet (50 mg total) by mouth daily.   Multiple Vitamin (MULTIVITAMIN WITH MINERALS) TABS Take 1 tablet by mouth daily.   nitroGLYCERIN (NITROSTAT) 0.4 MG SL tablet Place 1 tablet (0.4 mg total) under the tongue every 5 (five) minutes x 3 doses as needed for chest pain (if no relief after 2nd dose, proceed to ED for an evaluation).   pantoprazole (PROTONIX) 40 MG tablet Take 1 tablet (40 mg total) by mouth daily.   No facility-administered encounter medications on file as of 11/17/2022.    Allergies (verified) Morphine and codeine  History: Past Medical History:  Diagnosis Date   Abnormal cardiovascular stress test 03/2013   Anxiety    Arthritis    Back pain    Cancer (HCC)    Prostate cancer-no treatment per pt   CHF (congestive heart failure) (HCC)    Coronary artery disease    Multivessel with significant LAD involvement by chest CT 2014   Depression    Diastolic dysfunction    Essential hypertension    Lumbar herniated disc    L4-L5   MRSA (methicillin resistant staph aureus) culture positive    Myocardial infarction (HCC)    Neuropathy    Shortness of breath dyspnea    Sleep apnea    No cpap   Type 2 diabetes mellitus (HCC)    Past Surgical History:  Procedure Laterality Date   ANTERIOR CERVICAL DECOMP/DISCECTOMY FUSION  01/18/2011   Procedure: ANTERIOR CERVICAL  DECOMPRESSION/DISCECTOMY FUSION 2 LEVELS;  Surgeon: Kathaleen Maser Pool;  Location: [facility];  Service: Neurosurgery;  Laterality: N/A;  anterior cervical discectomy and fusion with allograft and plating, cervical five-six, cervical six-seven   BIOPSY N/A 04/04/2015   Procedure: BIOPSY;  Surgeon: Malissa Hippo, MD;  Location: [facility];  Service: Endoscopy;  Laterality: N/A;   BIOPSY  11/09/2019   Procedure: BIOPSY;  Surgeon: Dolores Frame, MD;  Location: [facility];  Service: Gastroenterology;;   Bone spur removed  1997   Left shoulder   CARDIAC CATHETERIZATION N/A 08/08/2015   Procedure: Right/Left Heart Cath and Coronary Angiography;  Surgeon: Tonny Bollman, MD;  Location[facility] INVASIVE CV LAB;  Service: Cardiovascular;  Laterality: N/A;   CARPAL TUNNEL RELEASE Right    COLONOSCOPY WITH PROPOFOL N/A 11/09/2019   Procedure: COLONOSCOPY WITH PROPOFOL;  Surgeon: Dolores Frame, MD;  Location: [facility];  Service: Gastroenterology;  Laterality: N/A;  900   COLONOSCOPY WITH PROPOFOL N/A 02/27/2021   Procedure: COLONOSCOPY WITH PROPOFOL;  Surgeon: Dolores Frame, MD;  Location: [facility];  Service: Gastroenterology;  Laterality: N/A;  7:30   COLONOSCOPY WITH PROPOFOL N/A 04/05/2022   Procedure: COLONOSCOPY WITH PROPOFOL;  Surgeon: Napoleon Form, MD;  Location: [facility];  Service: Gastroenterology;  Laterality: N/A;   ENTEROSCOPY N/A 06/30/2022   Procedure: ENTEROSCOPY;  Surgeon: Dolores Frame, MD;  [facility]: [facility];  Service: Gastroenterology;  Laterality: N/A;  2:00pm;ASA 3   ESOPHAGEAL DILATION N/A 04/04/2015   Procedure: ESOPHAGEAL DILATION;  Surgeon: Malissa Hippo, MD;  [facility];  Service: Endoscopy;  Laterality: N/A;   ESOPHAGEAL DILATION N/A 11/09/2019   Procedure: ESOPHAGEAL DILATION;  Surgeon: Dolores Frame, MD;  [facility];  Service: Gastroenterology;  Laterality: N/A;    ESOPHAGOGASTRODUODENOSCOPY N/A 04/04/2015   Procedure: ESOPHAGOGASTRODUODENOSCOPY (EGD);  Surgeon: Malissa Hippo, MD;  [facility];  Service: Endoscopy;  Laterality: N/A;  1240   ESOPHAGOGASTRODUODENOSCOPY (EGD) WITH PROPOFOL N/A 11/09/2019   Procedure: ESOPHAGOGASTRODUODENOSCOPY (EGD) WITH PROPOFOL;  Surgeon: Dolores Frame, MD;  [facility];  Service: Gastroenterology;  Laterality: N/A;   ESOPHAGOGASTRODUODENOSCOPY (EGD) WITH PROPOFOL N/A 04/04/2022   Procedure: ESOPHAGOGASTRODUODENOSCOPY (EGD) WITH PROPOFOL;  Surgeon: Benancio Deeds, MD;  Location[facility] ENDOSCOPY;  Service: Gastroenterology;  Laterality: N/A;   ESOPHAGOGASTRODUODENOSCOPY (EGD) WITH PROPOFOL  06/30/2022   Procedure: ESOPHAGOGASTRODUODENOSCOPY (EGD) WITH PROPOFOL;  Surgeon: Dolores Frame, MD;  [facility]: [facility];  Service: Gastroenterology;;   GIVENS CAPSULE STUDY N/A 05/11/2022   Procedure: GIVENS CAPSULE STUDY;  Surgeon: Marguerita Merles, Reuel Boom, MD;  [facility]: [facility];  Service: Gastroenterology;  Laterality: N/A;  7:30 am   HEMORRHOID SURGERY     HOT HEMOSTASIS  06/30/2022   Procedure: HOT HEMOSTASIS (ARGON PLASMA COAGULATION/BICAP);  Surgeon: Marguerita Merles, Reuel Boom, MD;  [facility]: [facility];  Service: Gastroenterology;;   POLYPECTOMY  11/09/2019   Procedure: POLYPECTOMY;  Surgeon: Marguerita Merles, Reuel Boom, MD;  [facility];  Service: Gastroenterology;;   POLYPECTOMY  02/27/2021   Procedure: POLYPECTOMY;  Surgeon: Marguerita Merles, Reuel Boom, MD;  [facility]: [facility];  Service: Gastroenterology;;   POLYPECTOMY  04/05/2022   Procedure: POLYPECTOMY;  Surgeon: Napoleon Form, MD;  Location: [facility] ENDOSCOPY;  Service: Gastroenterology;;   TONSILLECTOMY     Family History  Problem Relation Age of Onset   Lung cancer Mother    Diabetes Father    Hepatitis B Father    Diabetes Sister    Cushing syndrome Sister    Social History    Socioeconomic History   Marital status: Married    Spouse name: Not on file   Number of children: Not on file   Years of education: Not on file   Highest education level: Not on file  Occupational History   Not on file  Tobacco Use   Smoking status: Every Day    Current packs/day: 1.00    Average packs/day: 1 pack/day for 55.0 years (55.0 ttl pk-yrs)    Types: Cigarettes    Start date: 11/26/1967   Smokeless tobacco: Former   Tobacco comments:    smoking x 50 yrs  Vaping Use   Vaping status: Never Used  Substance and Sexual Activity   Alcohol use: Not Currently    Comment: rare   Drug use: Yes    Frequency: 5.0 times per week    Types: Marijuana    Comment: as needed   Sexual activity: Yes  Other Topics Concern   Not on file  Social History Narrative   Lives with wife   Right Handed   Drinks 2-3 cups caffeine daily   Social Determinants of Health   Financial Resource Strain: Low Risk  (11/17/2022)   Overall Financial Resource Strain (CARDIA)    Difficulty of Paying Living Expenses: Not hard at all  Food Insecurity: No Food Insecurity (11/17/2022)   Hunger Vital Sign    Worried About Running Out of Food in the Last Year: Never true    Ran Out of Food in the Last Year: Never true  Transportation Needs: No Transportation Needs (11/17/2022)   PRAPARE - Administrator, Civil Service (Medical): No    Lack of Transportation (Non-Medical): No  Physical Activity: Inactive (11/17/2022)   Exercise Vital Sign    Days of Exercise per Week: 0 days    Minutes of Exercise per Session: 0 min  Stress: No Stress Concern Present (11/17/2022)   [employer] of Occupational Health - Occupational Stress Questionnaire    Feeling of Stress : Not at all  Social Connections: Moderately Integrated (11/17/2022)   Social Connection and Isolation Panel [NHANES]    Frequency of Communication with Friends and Family: More than three times a week    Frequency of Social Gatherings with  Friends and Family: More than three times a week    Attends Religious Services: Never    Database administrator or Organizations: Yes    Attends Engineer, structural: More than 4 times per year    Marital Status:  Married    Tobacco Counseling Ready to quit: Not Answered Counseling given: Not Answered Tobacco comments: smoking x 50 yrs   Clinical Intake:  Pre-visit preparation completed: Yes  Pain : 0-10 Pain Score: 6  Pain Type: Chronic pain Pain Location: Back (shoulder R) Pain Descriptors / Indicators: Burning, Aching, Sharp Pain Onset: More than a month ago Pain Frequency: Constant     Diabetes: Yes CBG done?: No Did pt. bring in CBG monitor from home?: No  How often do you need to have someone help you when you read instructions, pamphlets, or other written materials from your doctor or pharmacy?: 1 - Never  Interpreter Needed?: No  Information entered by :: Remi Haggard LPN   Activities of Daily Living    06/04/2022    8:16 AM 04/03/2022    6:28 PM  In your present state of health, do you have any difficulty performing the following activities:  Hearing? 0 0  Vision? 0 0  Difficulty concentrating or making decisions? 0 0  Walking or climbing stairs? 0 0  Dressing or bathing? 0 0  Doing errands, shopping?  0    Patient Care Team: Shade Flood, MD as PCP - General (Family Medicine) Wyline Mood Dorothe Pea, MD as PCP - Cardiology (Cardiology) Marguerita Merles, Reuel Boom, MD as Consulting Physician (Gastroenterology)  Indicate any recent Medical Services you may have received from other than [facility] providers in the past year (date may be approximate).     Assessment:   This is a routine wellness examination for Jonathan Gilmore.  Hearing/Vision screen Hearing Screening - Comments:: No trouble hearing Vision Screening - Comments:: Unsure of name  Dietary issues and exercise activities discussed:     Goals Addressed             This Visit's Progress     Patient Stated       Continue current lifestyle       Depression Screen    11/17/2022    1:37 PM 08/19/2022   10:52 AM 04/19/2022   11:01 AM 01/28/2022   10:34 AM 01/20/2022    1:09 PM 12/17/2021   11:15 AM 10/22/2021   11:38 AM  PHQ 2/9 Scores  PHQ - 2 Score 2 0 0 0 0 0 0  PHQ- 9 Score 5 0 0 7 4 0     Fall Risk    11/17/2022    1:34 PM 08/19/2022   10:52 AM 04/19/2022   11:01 AM 01/28/2022   10:34 AM 01/20/2022    1:00 PM  Fall Risk   Falls in the past year? 0 0 0 1 0  Number falls in past yr: 0 0 0 1 0  Injury with Fall? 0 0 0 0 0  Risk for fall due to :  No Fall Risks No Fall Risks History of fall(s)   Follow up Falls evaluation completed;Education provided;Falls prevention discussed Falls evaluation completed Falls evaluation completed Falls evaluation completed Falls evaluation completed;Education provided;Falls prevention discussed    MEDICARE RISK AT HOME: Medicare Risk at Home Any stairs in or around the home?: No If so, are there any without handrails?: No Home free of loose throw rugs in walkways, pet beds, electrical cords, etc?: Yes Adequate lighting in your home to reduce risk of falls?: Yes Life alert?: No Use of a cane, walker or w/c?: No Grab bars in the bathroom?: No Shower chair or bench in shower?: No Elevated toilet seat or a handicapped toilet?: No  TIMED UP AND  GO:  Was the test performed?  No    Cognitive Function:    11/03/2020    1:15 PM  MMSE - Mini Mental State Exam  Orientation to time 5  Orientation to Place 5  Registration 3  Attention/ Calculation 5  Recall 3  Language- name 2 objects 2  Language- repeat 0  Language- follow 3 step command 3  Language- read & follow direction 1  Write a sentence 0  Copy design 0  Total score 27        11/17/2022    1:33 PM 01/20/2022    1:06 PM  6CIT Screen  What Year? 0 points 0 points  What month? 0 points 0 points  What time? 0 points 0 points  Count back from 20 0 points 0 points   Months in reverse 0 points 0 points  Repeat phrase 0 points 0 points  Total Score 0 points 0 points    Immunizations Immunization History  Administered Date(s) Administered   Fluad Quad(high Dose 65+) 11/29/2018   Influenza Split 01/19/2011   Influenza, High Dose Seasonal PF 01/05/2017, 01/15/2018   Influenza,inj,Quad PF,6+ Mos 02/25/2013   Moderna Sars-Covid-2 Vaccination 04/19/2019, 05/18/2019, 01/11/2020, 07/24/2020, 12/05/2020   Pneumococcal Conjugate-13 11/29/2018   Pneumococcal Polysaccharide-23 02/25/2013, 10/22/2021   Tdap 08/13/2020    TDAP status: Up to date  Flu Vaccine status: Due, Education has been provided regarding the importance of this vaccine. Advised may receive this vaccine at local pharmacy or Health Dept. Aware to provide a copy of the vaccination record if obtained from local pharmacy or Health Dept. Verbalized acceptance and understanding.  Pneumococcal vaccine status: Up to date  Covid-19 vaccine status: Information provided on how to obtain vaccines.   Qualifies for Shingles Vaccine? Yes   Zostavax completed No   Shingrix Completed?: No.    Education has been provided regarding the importance of this vaccine. Patient has been advised to call insurance company to determine out of pocket expense if they have not yet received this vaccine. Advised may also receive vaccine at local pharmacy or Health Dept. Verbalized acceptance and understanding.  Screening Tests Health Maintenance  Topic Date Due   Diabetic kidney evaluation - Urine ACR  08/21/2022   INFLUENZA VACCINE  10/14/2022   COVID-19 Vaccine (6 - 2023-24 season) 12/03/2022 (Originally 11/14/2022)   Hepatitis C Screening  01/29/2023 (Originally 11/25/1968)   Zoster Vaccines- Shingrix (1 of 2) 08/14/2023 (Originally 11/25/1969)   FOOT EXAM  01/29/2023   HEMOGLOBIN A1C  02/18/2023   OPHTHALMOLOGY EXAM  04/14/2023   Diabetic kidney evaluation - eGFR measurement  08/19/2023   Medicare Annual Wellness  (AWV)  11/17/2023   DTaP/Tdap/Td (2 - Td or Tdap) 08/14/2030   Colonoscopy  04/05/2032   Pneumonia Vaccine 48+ Years old  Completed   HPV VACCINES  Aged Out   Lung Cancer Screening  Discontinued    Health Maintenance  Health Maintenance Due  Topic Date Due   Diabetic kidney evaluation - Urine ACR  08/21/2022   INFLUENZA VACCINE  10/14/2022    Colorectal cancer screening: Type of screening: Colonoscopy. Completed 2024. Repeat every 10 years  Lung Cancer Screening: (Low Dose CT Chest recommended if Age 70-80 years, 20 pack-year currently smoking OR have quit w/in 15years.) does qualify.   Lung Cancer Screening Referral: due 03-2023  Additional Screening:  Hepatitis C Screening never done  Vision Screening: Recommended annual ophthalmology exams for early detection of glaucoma and other disorders of the eye. Is the patient  up to date with their annual eye exam?  No  Who is the provider or what is the name of the office in which the patient attends annual eye exams?  If pt is not established with a provider, would they like to be referred to a provider to establish care? No .   Dental Screening: Recommended annual dental exams for proper oral hygiene  Nutrition Risk Assessment:  Has the patient had any N/V/D within the last 2 months?  No  Does the patient have any non-healing wounds?  No  Has the patient had any unintentional weight loss or weight gain?  No   Diabetes:  Is the patient diabetic?  Yes  If diabetic, was a CBG obtained today?  No  Did the patient bring in their glucometer from home?  No  How often do you monitor your CBG's? 2 x a day.   Financial Strains and Diabetes Management:  Are you having any financial strains with the device, your supplies or your medication? No .  Does the patient want to be seen by Chronic Care Management for management of their diabetes?  No  Would the patient like to be referred to a Nutritionist or for Diabetic Management?  No    Diabetic Exams:  Diabetic Eye Exam: .  Pt has been advised about the importance in completing this exam. A referral has been placed today  Diabetic Foot Exam. Pt has been advised about the importance in completing this exam.    Community Resource Referral / Chronic Care Management: CRR required this visit?  No   CCM required this visit?  No     Plan:     I have personally reviewed and noted the following in the patient's chart:   Medical and social history Use of alcohol, tobacco or illicit drugs  Current medications and supplements including opioid prescriptions. Patient is not currently taking opioid prescriptions. Functional ability and status Nutritional status Physical activity Advanced directives List of other physicians Hospitalizations, surgeries, and ER visits in previous 12 months Vitals Screenings to include cognitive, depression, and falls Referrals and appointments  In addition, I have reviewed and discussed with patient certain preventive protocols, quality metrics, and best practice recommendations. A written personalized care plan for preventive services as well as general preventive health recommendations were provided to patient.     Remi Haggard, LPN   03/20/1094   After Visit Summary: (MyChart) Due to this being a telephonic visit, the after visit summary with patients personalized plan was offered to patient via MyChart   Nurse Notes:

## 2022-11-17 NOTE — Patient Instructions (Signed)
Jonathan Gilmore , Thank you for taking time to come for your Medicare Wellness Visit. I appreciate your ongoing commitment to your health goals. Please review the following plan we discussed and let me know if I can assist you in the future.   Screening recommendations/referrals: Colonoscopy: Education provided Recommended yearly ophthalmology/optometry visit for glaucoma screening and checkup Recommended yearly dental visit for hygiene and checkup  Vaccinations: Influenza vaccine: Education provided Pneumococcal vaccine: Education provided Tdap vaccine: Education provided Shingles vaccine: Education provided  Advanced directives: Education provided    Preventive Care 65 Years and Older, Male Preventive care refers to lifestyle choices and visits with your health care provider that can promote health and wellness. What does preventive care include? A yearly physical exam. This is also called an annual well check. Dental exams once or twice a year. Routine eye exams. Ask your health care provider how often you should have your eyes checked. Personal lifestyle choices, including: Daily care of your teeth and gums. Regular physical activity. Eating a healthy diet. Avoiding tobacco and drug use. Limiting alcohol use. Practicing safe sex. Taking low doses of aspirin every day. Taking vitamin and mineral supplements as recommended by your health care provider. What happens during an annual well check? The services and screenings done by your health care provider during your annual well check will depend on your age, overall health, lifestyle risk factors, and family history of disease. Counseling  Your health care provider may ask you questions about your: Alcohol use. Tobacco use. Drug use. Emotional well-being. Home and relationship well-being. Sexual activity. Eating habits. History of falls. Memory and ability to understand (cognition). Work and work Astronomer. Screening  You  may have the following tests or measurements: Height, weight, and BMI. Blood pressure. Lipid and cholesterol levels. These may be checked every 5 years, or more frequently if you are over 20 years old. Skin check. Lung cancer screening. You may have this screening every year starting at age 72 if you have a 30-pack-year history of smoking and currently smoke or have quit within the past 15 years. Fecal occult blood test (FOBT) of the stool. You may have this test every year starting at age 72. Flexible sigmoidoscopy or colonoscopy. You may have a sigmoidoscopy every 5 years or a colonoscopy every 10 years starting at age 72. Prostate cancer screening. Recommendations will vary depending on your family history and other risks. Hepatitis C blood test. Hepatitis B blood test. Sexually transmitted disease (STD) testing. Diabetes screening. This is done by checking your blood sugar (glucose) after you have not eaten for a while (fasting). You may have this done every 1-3 years. Abdominal aortic aneurysm (AAA) screening. You may need this if you are a current or former smoker. Osteoporosis. You may be screened starting at age 72 if you are at high risk. Talk with your health care provider about your test results, treatment options, and if necessary, the need for more tests. Vaccines  Your health care provider may recommend certain vaccines, such as: Influenza vaccine. This is recommended every year. Tetanus, diphtheria, and acellular pertussis (Tdap, Td) vaccine. You may need a Td booster every 10 years. Zoster vaccine. You may need this after age 72. Pneumococcal 13-valent conjugate (PCV13) vaccine. One dose is recommended after age 72. Pneumococcal polysaccharide (PPSV23) vaccine. One dose is recommended after age 75. Talk to your health care provider about which screenings and vaccines you need and how often you need them. This information is not intended to replace  advice given to you by your  health care provider. Make sure you discuss any questions you have with your health care provider. Document Released: 03/28/2015 Document Revised: 11/19/2015 Document Reviewed: 12/31/2014 Elsevier Interactive Patient Education  2017 [employer].  Fall Prevention in the Home Falls can cause injuries. They can happen to people of all ages. There are many things you can do to make your home safe and to help prevent falls. What can I do on the outside of my home? Regularly fix the edges of walkways and driveways and fix any cracks. Remove anything that might make you trip as you walk through a door, such as a raised step or threshold. Trim any bushes or trees on the path to your home. Use bright outdoor lighting. Clear any walking paths of anything that might make someone trip, such as rocks or tools. Regularly check to see if handrails are loose or broken. Make sure that both sides of any steps have handrails. Any raised decks and porches should have guardrails on the edges. Have any leaves, snow, or ice cleared regularly. Use sand or salt on walking paths during winter. Clean up any spills in your garage right away. This includes oil or grease spills. What can I do in the bathroom? Use night lights. Install grab bars by the toilet and in the tub and shower. Do not use towel bars as grab bars. Use non-skid mats or decals in the tub or shower. If you need to sit down in the shower, use a plastic, non-slip stool. Keep the floor dry. Clean up any water that spills on the floor as soon as it happens. Remove soap buildup in the tub or shower regularly. Attach bath mats securely with double-sided non-slip rug tape. Do not have throw rugs and other things on the floor that can make you trip. What can I do in the bedroom? Use night lights. Make sure that you have a light by your bed that is easy to reach. Do not use any sheets or blankets that are too big for your bed. They should not hang down  onto the floor. Have a firm chair that has side arms. You can use this for support while you get dressed. Do not have throw rugs and other things on the floor that can make you trip. What can I do in the kitchen? Clean up any spills right away. Avoid walking on wet floors. Keep items that you use a lot in easy-to-reach places. If you need to reach something above you, use a strong step stool that has a grab bar. Keep electrical cords out of the way. Do not use floor polish or wax that makes floors slippery. If you must use wax, use non-skid floor wax. Do not have throw rugs and other things on the floor that can make you trip. What can I do with my stairs? Do not leave any items on the stairs. Make sure that there are handrails on both sides of the stairs and use them. Fix handrails that are broken or loose. Make sure that handrails are as long as the stairways. Check any carpeting to make sure that it is firmly attached to the stairs. Fix any carpet that is loose or worn. Avoid having throw rugs at the top or bottom of the stairs. If you do have throw rugs, attach them to the floor with carpet tape. Make sure that you have a light switch at the top of the stairs and the bottom  of the stairs. If you do not have them, ask someone to add them for you. What else can I do to help prevent falls? Wear shoes that: Do not have high heels. Have rubber bottoms. Are comfortable and fit you well. Are closed at the toe. Do not wear sandals. If you use a stepladder: Make sure that it is fully opened. Do not climb a closed stepladder. Make sure that both sides of the stepladder are locked into place. Ask someone to hold it for you, if possible. Clearly mark and make sure that you can see: Any grab bars or handrails. First and last steps. Where the edge of each step is. Use tools that help you move around (mobility aids) if they are needed. These include: Canes. Walkers. Scooters. Crutches. Turn  on the lights when you go into a dark area. Replace any light bulbs as soon as they burn out. Set up your furniture so you have a clear path. Avoid moving your furniture around. If any of your floors are uneven, fix them. If there are any pets around you, be aware of where they are. Review your medicines with your doctor. Some medicines can make you feel dizzy. This can increase your chance of falling. Ask your doctor what other things that you can do to help prevent falls. This information is not intended to replace advice given to you by your health care provider. Make sure you discuss any questions you have with your health care provider. Document Released: 12/26/2008 Document Revised: 08/07/2015 Document Reviewed: 04/05/2014 Elsevier Interactive Patient Education  2017 [employer].

## 2022-11-22 ENCOUNTER — Other Ambulatory Visit (INDEPENDENT_AMBULATORY_CARE_PROVIDER_SITE_OTHER): Payer: Medicare Other

## 2022-11-22 ENCOUNTER — Encounter: Payer: Self-pay | Admitting: Orthopedic Surgery

## 2022-11-22 ENCOUNTER — Ambulatory Visit: Payer: Medicare Other | Admitting: Orthopedic Surgery

## 2022-11-22 VITALS — BP 127/86 | HR 89 | Ht 67.0 in | Wt 175.0 lb

## 2022-11-22 DIAGNOSIS — M25511 Pain in right shoulder: Secondary | ICD-10-CM | POA: Diagnosis not present

## 2022-11-22 DIAGNOSIS — Z981 Arthrodesis status: Secondary | ICD-10-CM | POA: Diagnosis not present

## 2022-11-22 DIAGNOSIS — G8929 Other chronic pain: Secondary | ICD-10-CM

## 2022-11-22 DIAGNOSIS — M7551 Bursitis of right shoulder: Secondary | ICD-10-CM | POA: Diagnosis not present

## 2022-11-22 MED ORDER — MELOXICAM 7.5 MG PO TABS: 7.5000 mg | ORAL_TABLET | Freq: Every day | ORAL | 5 refills | Status: DC

## 2022-11-22 MED ORDER — METHYLPREDNISOLONE ACETATE 40 MG/ML IJ SUSP
40.0000 mg | Freq: Once | INTRAMUSCULAR | Status: AC
Start: 2022-11-22 — End: 2022-11-22
  Administered 2022-11-22: 40 mg via INTRA_ARTICULAR

## 2022-11-22 NOTE — Progress Notes (Signed)
Office Visit Note   Patient: Jonathan Gilmore           Date of Birth: [birth year]           MRN: [id number] Visit Date: 11/22/2022 Requested by: Shade Flood, MD [address] [place],  [place] [address] PCP: Shade Flood, MD   Assessment & Plan:   Encounter Diagnoses  Name Primary?   Hx of fusion of cervical spine    Acute pain of right shoulder    Bursitis of right shoulder Yes   Rec INJECTION AND NSAIDS FU 6-8 WEEKS  Encounter Diagnoses  Name Primary?   Hx of fusion of cervical spine    Acute pain of right shoulder    Bursitis of right shoulder Yes    Meds ordered this encounter  Medications   meloxicam (MOBIC) 7.5 MG tablet    Sig: Take 1 tablet (7.5 mg total) by mouth daily.    Dispense:  30 tablet    Refill:  5   methylPREDNISolone acetate (DEPO-MEDROL) injection 40 mg     Procedure note the subacromial injection shoulder RIGHT    Verbal consent was obtained to inject the  RIGHT   Shoulder  Timeout was completed to confirm the injection site is a subacromial space of the  RIGHT  shoulder   Medication used Depo-Medrol 40 mg and lidocaine 1% 3 cc  Anesthesia was provided by ethyl chloride  The injection was performed in the RIGHT  posterior subacromial space. After pinning the skin with alcohol and anesthetized the skin with ethyl chloride the subacromial space was injected using a 20-gauge needle. There were no complications  Sterile dressing was applied.     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  Comes in with progressively increasing right shoulder pain over  the last.  He describes this as severe pain.  It is associated with loss of motion  No prior treatment  History of type 2 diabetes congestive heart failure coronary artery disease tobacco dependence moderate aortic stenosis              ROS: Neck pain was the only thing listed   Images personally read and my interpretation :  NORMAL EXCEPT DISTAL CLAVICLE EXCISION  Visit Diagnoses:  1. Bursitis of right shoulder   2. Hx of fusion of cervical spine   3. Acute pain of right shoulder      Follow-Up Instructions: Return in about 2 months (around 01/22/2023) for FOLLOW UP, RIGHT, SHOULDER.    Objective: Vital Signs: BP 127/86   Pulse 89   Ht 5\' 7"  (1.702 m)   Wt 175 lb (79.4 kg)   BMI 27.41 kg/m   Physical Exam Vitals and nursing note reviewed.  Constitutional:      General: He is not in acute distress.    Appearance: Normal appearance. He is not ill-appearing, toxic-appearing or diaphoretic.  HENT:     Head: Normocephalic and atraumatic.     Nose: Nose normal. No congestion or rhinorrhea.  Eyes:     General: No  scleral icterus.       Right eye: No discharge.        Left eye: No discharge.     Extraocular Movements: Extraocular movements intact.     Conjunctiva/sclera: Conjunctivae normal.     Pupils: Pupils are equal, round, and reactive to light.  Cardiovascular:     Pulses: Normal pulses.  Pulmonary:     Effort: Pulmonary effort is normal.     Breath sounds: No wheezing.  Musculoskeletal:       Arms:     Comments: Transverse scar over the top of the shoulder over the [facility] joint  Skin:    General: Skin is warm and dry.     Capillary Refill: Capillary refill takes less than 2 seconds.     Coloration: Skin is not jaundiced.     Findings: No erythema.  Neurological:     General: No focal deficit present.     Mental Status: He is alert and oriented to person, place, and time.  Psychiatric:        Mood and Affect: Mood normal.        Behavior: Behavior normal.         Thought Content: Thought content normal.        Judgment: Judgment normal.      Right Shoulder Exam   Tenderness  Right shoulder tenderness location: Yes.  Range of Motion  Active abduction:  abnormal  Passive abduction:  abnormal  Extension:  abnormal  External rotation:  abnormal  Forward flexion:  abnormal   Muscle Strength  Right shoulder normal muscle strength: Pain precluded any strength testing.  Other  Erythema: absent Scars: present Sensation: normal Pulse: present       Specialty Comments:  No specialty comments available.  Imaging: DG Shoulder Right  Result Date: 11/22/2022 Shoulder images AP and lateral History of distal clavicle excision.  History of recent onset of pain right shoulder Images show previous distal clavicle excision normal head shape normal glenoid questionable cyst in the glenoid greater tuberosity Impression history of previous distal clavicle excision with normal glenohumeral joint some tuberosity sclerosis otherwise normal shoulder     PMFS History: Patient Active Problem List   Diagnosis Date Noted   Hx of fusion of cervical spine 11/22/2022   AVM (arteriovenous malformation) of small bowel, acquired 06/30/2022   Iron deficiency anemia due to chronic blood loss 04/22/2022   History of esophagitis 04/22/2022   Heme positive stool 04/04/2022   Iron deficiency anemia 04/04/2022   Acute blood loss anemia 04/02/2022   Prostate cancer (HCC) 06/08/2021   History of colonic polyps 02/09/2021   Intestinal metaplasia of antrum of stomach without dysplasia 02/09/2021   Insomnia 03/12/2019   Pure hypercholesterolemia 11/29/2018   Hypogonadism, male 11/23/2018   Essential hypertension, benign 11/13/2018   High risk medication use 10/28/2018   Carotid stenosis, asymptomatic, bilateral 10/28/2018   Tobacco use 10/28/2018   Polyp of colon 10/28/2018   Lumbar disc herniation with radiculopathy 12/21/2017   Spinal stenosis of lumbar  region without neurogenic claudication 12/21/2017   CAD -50% LAD  08/09/2015   Moderate aortic stenosis 08/09/2015   Acute pulmonary edema (HCC) 08/07/2015   Acute diastolic CHF (congestive heart failure) (HCC) 08/07/2015   Hypersomnia with sleep apnea, unspecified 09/20/2013   Syncope 09/19/2013   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   OSA (obstructive sleep apnea) -non compliant 09/19/2013   Narcolepsy and cataplexy 09/19/2013   Facial abscess 04/28/2013   Facial cellulitis 04/28/2013  Mixed hyperlipidemia 04/28/2013   Tobacco dependence 04/28/2013   Sepsis (HCC) 04/28/2013   Gout 04/28/2013   Diastolic CHF (HCC) 04/28/2013   Pleuritic pain 02/24/2013   Chest pain 02/24/2013   Murmur 02/24/2013   Pericarditis 02/24/2013   DM type 2 causing vascular disease (HCC) 02/24/2013   Essential hypertension 02/24/2013   Cervical spondylosis with myelopathy 01/19/2011   Past Medical History:  Diagnosis Date   Abnormal cardiovascular stress test 03/2013   Anxiety    Arthritis    Back pain    Cancer (HCC)    Prostate cancer-no treatment per pt   CHF (congestive heart failure) (HCC)    Coronary artery disease    Multivessel with significant LAD involvement by chest CT 2014   Depression    Diastolic dysfunction    Essential hypertension    Lumbar herniated disc    L4-L5   MRSA (methicillin resistant staph aureus) culture positive    Myocardial infarction (HCC)    Neuropathy    Shortness of breath dyspnea    Sleep apnea    No cpap   Type 2 diabetes mellitus (HCC)     Family History  Problem Relation Age of Onset   Lung cancer Mother    Diabetes Father    Hepatitis B Father    Diabetes Sister    Cushing syndrome Sister     Past Surgical History:  Procedure Laterality Date   ANTERIOR CERVICAL DECOMP/DISCECTOMY FUSION  01/18/2011   Procedure: ANTERIOR CERVICAL DECOMPRESSION/DISCECTOMY FUSION 2 LEVELS;  Surgeon: Kathaleen Maser Pool;  Location: [facility];  Service:  Neurosurgery;  Laterality: N/A;  anterior cervical discectomy and fusion with allograft and plating, cervical five-six, cervical six-seven   BIOPSY N/A 04/04/2015   Procedure: BIOPSY;  Surgeon: Malissa Hippo, MD;  Location: [facility];  Service: Endoscopy;  Laterality: N/A;   BIOPSY  11/09/2019   Procedure: BIOPSY;  Surgeon: Dolores Frame, MD;  Location: [facility];  Service: Gastroenterology;;   Bone spur removed  1997   Left shoulder   CARDIAC CATHETERIZATION N/A 08/08/2015   Procedure: Right/Left Heart Cath and Coronary Angiography;  Surgeon: Tonny Bollman, MD;  Location: [facility] INVASIVE CV LAB;  Service: Cardiovascular;  Laterality: N/A;   CARPAL TUNNEL RELEASE Right    COLONOSCOPY WITH PROPOFOL N/A 11/09/2019   Procedure: COLONOSCOPY WITH PROPOFOL;  Surgeon: Dolores Frame, MD;  Location: [facility];  Service: Gastroenterology;  Laterality: N/A;  900   COLONOSCOPY WITH PROPOFOL N/A 02/27/2021   Procedure: COLONOSCOPY WITH PROPOFOL;  Surgeon: Dolores Frame, MD;  Location: [facility];  Service: Gastroenterology;  Laterality: N/A;  7:30   COLONOSCOPY WITH PROPOFOL N/A 04/05/2022   Procedure: COLONOSCOPY WITH PROPOFOL;  Surgeon: Napoleon Form, MD;  Location: [facility];  Service: Gastroenterology;  Laterality: N/A;   ENTEROSCOPY N/A 06/30/2022   Procedure: ENTEROSCOPY;  Surgeon: Dolores Frame, MD;  Location: [facility];  Service: Gastroenterology;  Laterality: N/A;  2:00pm;ASA 3   ESOPHAGEAL DILATION N/A 04/04/2015   Procedure: ESOPHAGEAL DILATION;  Surgeon: Malissa Hippo, MD;  Location: [facility];  Service: Endoscopy;  Laterality: N/A;   ESOPHAGEAL DILATION N/A 11/09/2019   Procedure: ESOPHAGEAL DILATION;  Surgeon: Dolores Frame, MD;  Location: [facility];  Service: Gastroenterology;  Laterality: N/A;   ESOPHAGOGASTRODUODENOSCOPY N/A 04/04/2015   Procedure: ESOPHAGOGASTRODUODENOSCOPY (EGD);  Surgeon: Malissa Hippo, MD;  Location: [facility];  Service: Endoscopy;  Laterality: N/A;  1240  ESOPHAGOGASTRODUODENOSCOPY (EGD) WITH PROPOFOL N/A 11/09/2019   Procedure: ESOPHAGOGASTRODUODENOSCOPY (EGD) WITH PROPOFOL;  Surgeon: Dolores Frame, MD;  [facility]: [facility];  Service: Gastroenterology;  Laterality: N/A;   ESOPHAGOGASTRODUODENOSCOPY (EGD) WITH PROPOFOL N/A 04/04/2022   Procedure: ESOPHAGOGASTRODUODENOSCOPY (EGD) WITH PROPOFOL;  Surgeon: Benancio Deeds, MD;  Location: [facility] ENDOSCOPY;  Service: Gastroenterology;  Laterality: N/A;   ESOPHAGOGASTRODUODENOSCOPY (EGD) WITH PROPOFOL  06/30/2022   Procedure: ESOPHAGOGASTRODUODENOSCOPY (EGD) WITH PROPOFOL;  Surgeon: Dolores Frame, MD;  Location: [facility];  Service: Gastroenterology;;   GIVENS CAPSULE STUDY N/A 05/11/2022   Procedure: GIVENS CAPSULE STUDY;  Surgeon: Dolores Frame, MD;  Location: [facility];  Service: Gastroenterology;  Laterality: N/A;  7:30 am   HEMORRHOID SURGERY     HOT HEMOSTASIS  06/30/2022   Procedure: HOT HEMOSTASIS (ARGON PLASMA COAGULATION/BICAP);  Surgeon: Marguerita Merles, Reuel Boom, MD;  Location: [facility];  Service: Gastroenterology;;   POLYPECTOMY  11/09/2019   Procedure: POLYPECTOMY;  Surgeon: Dolores Frame, MD;  Location: [facility];  Service: Gastroenterology;;   POLYPECTOMY  02/27/2021   Procedure: POLYPECTOMY;  Surgeon: Dolores Frame, MD;  Location: [facility];  Service: Gastroenterology;;   POLYPECTOMY  04/05/2022   Procedure: POLYPECTOMY;  Surgeon: Napoleon Form, MD;  Location[facility];  Service: Gastroenterology;;   TONSILLECTOMY     Social History   Occupational History   Not on file  Tobacco Use   Smoking status: Every Day    Current packs/day: 1.00    Average packs/day: 1 pack/day for 55.0 years (55.0 ttl pk-yrs)    Types: Cigarettes    Start date: 11/26/1967   Smokeless tobacco: Former   Tobacco comments:    smoking  x 50 yrs  Vaping Use   Vaping status: Never Used  Substance and Sexual Activity   Alcohol use: Not Currently    Comment: rare   Drug use: Yes    Frequency: 5.0 times per week    Types: Marijuana    Comment: as needed   Sexual activity: Yes

## 2022-11-27 ENCOUNTER — Encounter: Payer: Self-pay | Admitting: Orthopedic Surgery

## 2022-12-02 ENCOUNTER — Ambulatory Visit: Payer: Medicare Other | Admitting: Orthopedic Surgery

## 2022-12-07 NOTE — Progress Notes (Signed)
Subjective:   Jonathan Gilmore is a 72 y.o. male who presents for Medicare Annual/Subsequent preventive examination.  Visit Complete: Virtual  I connected with  Jonathan Gilmore on 9-06-15-22 by a audio enabled telemedicine application and verified that I am speaking with the correct person using two identifiers.  Patient Location: Home  Provider Location: Home Office  I discussed the limitations of evaluation and management by telemedicine. The patient expressed understanding and agreed to proceed.  Vital Signs: Unable to obtain new vitals due to this being a telehealth visit.   Review of Systems     Cardiac Risk Factors include: advanced age (>28men, >89 women);male gender;obesity (BMI >30kg/m2);hypertension     Objective:    Today's Vitals   11/17/22 1330  PainSc: 6    There is no height or weight on file to calculate BMI.     11/17/2022    2:06 PM 06/30/2022   12:14 PM 06/08/2022   12:33 PM 06/08/2022   12:31 PM 06/08/2022   12:30 PM 04/03/2022    6:28 PM 04/02/2022    7:19 PM  Advanced Directives  Does Patient Have a Medical Advance Directive? No Yes No No No Yes No  Type of Special educational needs teacher of [place];Living will    Living will   Does patient want to make changes to medical advance directive?      No - Patient declined   Copy of Healthcare Power of Attorney in Chart?  No - copy requested       Would patient like information on creating a medical advance directive? No - Patient declined  No - Patient declined No - Patient declined No - Patient declined      Current Medications (verified) Outpatient Encounter Medications as of 11/17/2022  Medication Sig   ACCU-CHEK AVIVA PLUS test strip USE AS INSTRUCTED   aspirin EC 81 MG tablet Take 1 tablet (81 mg total) by mouth daily.   atorvastatin (LIPITOR) 80 MG tablet TAKE 1 TABLET BY MOUTH DAILY   Calcium-Magnesium-Zinc (CAL-MAG-ZINC PO) Take 1 tablet by mouth daily.   empagliflozin (JARDIANCE) 10 MG TABS  tablet Take 1 tablet (10 mg total) by mouth daily before breakfast.   ferrous sulfate 325 (65 FE) MG tablet Take 1 tablet (325 mg total) by mouth daily with breakfast.   folic acid (FOLVITE) 1 MG tablet Take 1 tablet (1 mg total) by mouth daily.   furosemide (LASIX) 40 MG tablet Take 1 tablet (40 mg total) by mouth as needed for edema (swelling).   gabapentin (NEURONTIN) 300 MG capsule TAKE 1 TO 2 CAPSULES BY MOUTH  TWICE DAILY   lisinopril (ZESTRIL) 20 MG tablet Take 1 tablet (20 mg total) by mouth daily.   magnesium oxide (MAG-OX) 400 MG tablet Take 400 mg by mouth at bedtime.   metFORMIN (GLUCOPHAGE) 500 MG tablet TAKE 2 TABLETS BY MOUTH TWICE  DAILY   metoprolol succinate (TOPROL-XL) 50 MG 24 hr tablet Take 1 tablet (50 mg total) by mouth daily.   Multiple Vitamin (MULTIVITAMIN WITH MINERALS) TABS Take 1 tablet by mouth daily.   nitroGLYCERIN (NITROSTAT) 0.4 MG SL tablet Place 1 tablet (0.4 mg total) under the tongue every 5 (five) minutes x 3 doses as needed for chest pain (if no relief after 2nd dose, proceed to ED for an evaluation).   pantoprazole (PROTONIX) 40 MG tablet Take 1 tablet (40 mg total) by mouth daily.   No facility-administered encounter medications on file as of 11/17/2022.  Allergies (verified) Morphine and codeine   History: Past Medical History:  Diagnosis Date   Abnormal cardiovascular stress test 03/2013   Anxiety    Arthritis    Back pain    Cancer (HCC)    Prostate cancer-no treatment per pt   CHF (congestive heart failure) (HCC)    Coronary artery disease    Multivessel with significant LAD involvement by chest CT 2014   Depression    Diastolic dysfunction    Essential hypertension    Lumbar herniated disc    L4-L5   MRSA (methicillin resistant staph aureus) culture positive    Myocardial infarction (HCC)    Neuropathy    Shortness of breath dyspnea    Sleep apnea    No cpap   Type 2 diabetes mellitus (HCC)    Past Surgical History:   Procedure Laterality Date   ANTERIOR CERVICAL DECOMP/DISCECTOMY FUSION  01/18/2011   Procedure: ANTERIOR CERVICAL DECOMPRESSION/DISCECTOMY FUSION 2 LEVELS;  Surgeon: Kathaleen Maser Pool;  Location: [facility];  Service: Neurosurgery;  Laterality: N/A;  anterior cervical discectomy and fusion with allograft and plating, cervical five-six, cervical six-seven   BIOPSY N/A 04/04/2015   Procedure: BIOPSY;  Surgeon: Malissa Hippo, MD;  Location: [facility];  Service: Endoscopy;  Laterality: N/A;   BIOPSY  11/09/2019   Procedure: BIOPSY;  Surgeon: Dolores Frame, MD;  Location: [facility];  Service: Gastroenterology;;   Bone spur removed  1997   Left shoulder   CARDIAC CATHETERIZATION N/A 08/08/2015   Procedure: Right/Left Heart Cath and Coronary Angiography;  Surgeon: Tonny Bollman, MD;  Location: [facility] INVAS[facility] CV LAB;  Service: Cardiovascular;  Laterality: N/A;   CARPAL TUNNEL RELEASE Right    COLONOSCOPY WITH PROPOFOL N/A 11/09/2019   Procedure: COLONOSCOPY WITH PROPOFOL;  Surgeon: Dolores Frame, MD;  Location: [facility];  Service: Gastroenterology;  Laterality: N/A;  900   COLONOSCOPY WITH PROPOFOL N/A 02/27/2021   Procedure: COLONOSCOPY WITH PROPOFOL;  Surgeon: Dolores Frame, MD;  Location: [facility];  Service: Gastroenterology;  Laterality: N/A;  7:30   COLONOSCOPY WITH PROPOFOL N/A 04/05/2022   Procedure: COLONOSCOPY WITH PROPOFOL;  Surgeon: Napoleon Form, MD;  Location: [facility];  Service: Gastroenterology;  Laterality: N/A;   ENTEROSCOPY N/A 06/30/2022   Procedure: ENTEROSCOPY;  Surgeon: Dolores Frame, MD;  [facility]: [facility];  Service: Gastroenterology;  Laterality: N/A;  2:00pm;ASA 3   ESOPHAGEAL DILATION N/A 04/04/2015   Procedure: ESOPHAGEAL DILATION;  Surgeon: Malissa Hippo, MD;  [facility];  Service: Endoscopy;  Laterality: N/A;   ESOPHAGEAL DILATION N/A 11/09/2019   Procedure: ESOPHAGEAL DILATION;   Surgeon: Dolores Frame, MD;  [facility]: [facility];  Service: Gastroenterology;  Laterality: N/A;   ESOPHAGOGASTRODUODENOSCOPY N/A 04/04/2015   Procedure: ESOPHAGOGASTRODUODENOSCOPY (EGD);  Surgeon: Malissa Hippo, MD;  [facility];  Service: Endoscopy;  Laterality: N/A;  1240   ESOPHAGOGASTRODUODENOSCOPY (EGD) WITH PROPOFOL N/A 11/09/2019   Procedure: ESOPHAGOGASTRODUODENOSCOPY (EGD) WITH PROPOFOL;  Surgeon: Dolores Frame, MD;  [facility];  Service: Gastroenterology;  Laterality: N/A;   ESOPHAGOGASTRODUODENOSCOPY (EGD) WITH PROPOFOL N/A 04/04/2022   Procedure: ESOPHAGOGASTRODUODENOSCOPY (EGD) WITH PROPOFOL;  Surgeon: Benancio Deeds, MD;  Location[facility];  Service: Gastroenterology;  Laterality: N/A;   ESOPHAGOGASTRODUODENOSCOPY (EGD) WITH PROPOFOL  06/30/2022   Procedure: ESOPHAGOGASTRODUODENOSCOPY (EGD) WITH PROPOFOL;  Surgeon: Dolores Frame, MD;  [facility];  Service: Gastroenterology;;   GIVENS CAPSULE STUDY N/A  05/11/2022   Procedure: GIVENS CAPSULE STUDY;  Surgeon: Dolores Frame, MD;  [facility]: [facility];  Service: Gastroenterology;  Laterality: N/A;  7:30 am   HEMORRHOID SURGERY     HOT HEMOSTASIS  06/30/2022   Procedure: HOT HEMOSTASIS (ARGON PLASMA COAGULATION/BICAP);  Surgeon: Marguerita Merles, Reuel Boom, MD;  Location: [facility];  Service: Gastroenterology;;   POLYPECTOMY  11/09/2019   Procedure: POLYPECTOMY;  Surgeon: Marguerita Merles, Reuel Boom, MD;  [facility];  Service: Gastroenterology;;   POLYPECTOMY  02/27/2021   Procedure: POLYPECTOMY;  Surgeon: Marguerita Merles, Reuel Boom, MD;  [facility]: [facility];  Service: Gastroenterology;;   POLYPECTOMY  04/05/2022   Procedure: POLYPECTOMY;  Surgeon: Napoleon Form, MD;  Location: [facility] ENDOS[facility];  Service: Gastroenterology;;   TONSILLECTOMY     Family History  Problem Relation Age of Onset   Lung cancer Mother     Diabetes Father    Hepatitis B Father    Diabetes Sister    Cushing syndrome Sister    Social History   Socioeconomic History   Marital status: Married    Spouse name: Not on file   Number of children: Not on file   Years of education: Not on file   Highest education level: Not on file  Occupational History   Not on file  Tobacco Use   Smoking status: Every Day    Current packs/day: 1.00    Average packs/day: 1 pack/day for 55.0 years (55.0 ttl pk-yrs)    Types: Cigarettes    Start date: 11/26/1967   Smokeless tobacco: Former   Tobacco comments:    smoking x 50 yrs  Vaping Use   Vaping status: Never Used  Substance and Sexual Activity   Alcohol use: Not Currently    Comment: rare   Drug use: Yes    Frequency: 5.0 times per week    Types: Marijuana    Comment: as needed   Sexual activity: Yes  Other Topics Concern   Not on file  Social History Narrative   Lives with wife   Right Handed   Drinks 2-3 cups caffeine daily   Social Determinants of Health   Financial Resource Strain: Low Risk  (11/17/2022)   Overall Financial Resource Strain (CARDIA)    Difficulty of Paying Living Expenses: Not hard at all  Food Insecurity: No Food Insecurity (11/17/2022)   Hunger Vital Sign    Worried About Running Out of Food in the Last Year: Never true    Ran Out of Food in the Last Year: Never true  Transportation Needs: No Transportation Needs (11/17/2022)   PRAPARE - Administrator, Civil Service (Medical): No    Lack of Transportation (Non-Medical): No  Physical Activity: Inactive (11/17/2022)   Exercise Vital Sign    Days of Exercise per Week: 0 days    Minutes of Exercise per Session: 0 min  Stress: No Stress Concern Present (11/17/2022)   [employer] of Occupational Health - Occupational Stress Questionnaire    Feeling of Stress : Not at all  Social Connections: Moderately Integrated (11/17/2022)   Social Connection and Isolation Panel [NHANES]    Frequency of  Communication with Friends and Family: More than three times a week    Frequency of Social Gatherings with Friends and Family: More than three times a week    Attends Religious Services: Never    Database administrator or Organizations: Yes    Attends Engineer, structural: More than 4  times per year    Marital Status: Married    Tobacco Counseling Ready to quit: Not Answered Counseling given: Not Answered Tobacco comments: smoking x 50 yrs   Clinical Intake:  Pre-visit preparation completed: Yes  Pain : 0-10 Pain Score: 6  Pain Type: Chronic pain Pain Location: Back (shoulder R) Pain Descriptors / Indicators: Burning, Aching, Sharp Pain Onset: More than a month ago Pain Frequency: Constant     Diabetes: Yes CBG done?: No Did pt. bring in CBG monitor from home?: No  How often do you need to have someone help you when you read instructions, pamphlets, or other written materials from your doctor or pharmacy?: 1 - Never  Interpreter Needed?: No  Information entered by :: Remi Haggard LPN   Activities of Daily Living    11/23/2022    8:50 AM 11/17/2022    8:53 AM  In your present state of health, do you have any difficulty performing the following activities:  Hearing? 0 0  Vision? 0 0  Difficulty concentrating or making decisions? 0 0  Walking or climbing stairs? 0 0  Dressing or bathing? 0 0  Doing errands, shopping? 0 0  Preparing Food and eating ? N   Using the Toilet? N   In the past six months, have you accidently leaked urine? N   Do you have problems with loss of bowel control? N   Managing your Medications? N   Managing your Finances? N   Housekeeping or managing your Housekeeping? N     Patient Care Team: Shade Flood, MD as PCP - General (Family Medicine) Wyline Mood Dorothe Pea, MD as PCP - Cardiology (Cardiology) Marguerita Merles, Reuel Boom, MD as Consulting Physician (Gastroenterology)  Indicate any recent Medical Services you may have  received from other than [facility] providers in the past year (date may be approximate).     Assessment:   This is a routine wellness examination for Hillel.  Hearing/Vision screen Hearing Screening - Comments:: No trouble hearing Vision Screening - Comments:: Unsure of name  Dietary issues and exercise activities discussed:     Goals Addressed             This Visit's Progress    Patient Stated       Continue current lifestyle       Depression Screen    11/17/2022    1:37 PM 08/19/2022   10:52 AM 04/19/2022   11:01 AM 01/28/2022   10:34 AM 01/20/2022    1:09 PM 12/17/2021   11:15 AM 10/22/2021   11:38 AM  PHQ 2/9 Scores  PHQ - 2 Score 2 0 0 0 0 0 0  PHQ- 9 Score 5 0 0 7 4 0     Fall Risk    11/17/2022    1:34 PM 08/19/2022   10:52 AM 04/19/2022   11:01 AM 01/28/2022   10:34 AM 01/20/2022    1:00 PM  Fall Risk   Falls in the past year? 0 0 0 1 0  Number falls in past yr: 0 0 0 1 0  Injury with Fall? 0 0 0 0 0  Risk for fall due to :  No Fall Risks No Fall Risks History of fall(s)   Follow up Falls evaluation completed;Education provided;Falls prevention discussed Falls evaluation completed Falls evaluation completed Falls evaluation completed Falls evaluation completed;Education provided;Falls prevention discussed    MEDICARE RISK AT HOME: Medicare Risk at Home Any stairs in or around the home?: No If so,  are there any without handrails?: No Home free of loose throw rugs in walkways, pet beds, electrical cords, etc?: Yes Adequate lighting in your home to reduce risk of falls?: Yes Life alert?: No Use of a cane, walker or w/c?: No Grab bars in the bathroom?: No Shower chair or bench in shower?: No Elevated toilet seat or a handicapped toilet?: No  TIMED UP AND GO:  Was the test performed?  No    Cognitive Function:    11/03/2020    1:15 PM  MMSE - Mini Mental State Exam  Orientation to time 5  Orientation to Place 5  Registration 3  Attention/ Calculation 5   Recall 3  Language- name 2 objects 2  Language- repeat 0  Language- follow 3 step command 3  Language- read & follow direction 1  Write a sentence 0  Copy design 0  Total score 27        11/17/2022    1:33 PM 01/20/2022    1:06 PM  6CIT Screen  What Year? 0 points 0 points  What month? 0 points 0 points  What time? 0 points 0 points  Count back from 20 0 points 0 points  Months in reverse 0 points 0 points  Repeat phrase 0 points 0 points  Total Score 0 points 0 points    Immunizations Immunization History  Administered Date(s) Administered   Fluad Quad(high Dose 65+) 11/29/2018   Influenza Split 01/19/2011   Influenza, High Dose Seasonal PF 01/05/2017, 01/15/2018   Influenza,inj,Quad PF,6+ Mos 02/25/2013   Moderna Sars-Covid-2 Vaccination 04/19/2019, 05/18/2019, 01/11/2020, 07/24/2020, 12/05/2020   Pneumococcal Conjugate-13 11/29/2018   Pneumococcal Polysaccharide-23 02/25/2013, 10/22/2021   Tdap 08/13/2020    TDAP status: Up to date  Flu Vaccine status: Due, Education has been provided regarding the importance of this vaccine. Advised may receive this vaccine at local pharmacy or Health Dept. Aware to provide a copy of the vaccination record if obtained from local pharmacy or Health Dept. Verbalized acceptance and understanding.  Pneumococcal vaccine status: Up to date  Covid-19 vaccine status: Information provided on how to obtain vaccines.   Qualifies for Shingles Vaccine? Yes   Zostavax completed No   Shingrix Completed?: No.    Education has been provided regarding the importance of this vaccine. Patient has been advised to call insurance company to determine out of pocket expense if they have not yet received this vaccine. Advised may also receive vaccine at local pharmacy or Health Dept. Verbalized acceptance and understanding.  Screening Tests Health Maintenance  Topic Date Due   Diabetic kidney evaluation - Urine ACR  08/21/2022   INFLUENZA VACCINE   10/14/2022   COVID-19 Vaccine (6 - 2023-24 season) 11/14/2022   Hepatitis C Screening  01/29/2023 (Originally 11/25/1968)   Zoster Vaccines- Shingrix (1 of 2) 08/14/2023 (Originally 11/25/1969)   FOOT EXAM  01/29/2023   HEMOGLOBIN A1C  02/18/2023   OPHTHALMOLOGY EXAM  04/14/2023   Diabetic kidney evaluation - eGFR measurement  08/19/2023   Medicare Annual Wellness (AWV)  11/17/2023   DTaP/Tdap/Td (2 - Td or Tdap) 08/14/2030   Colonoscopy  04/05/2032   Pneumonia Vaccine 29+ Years old  Completed   HPV VACCINES  Aged Out   Lung Cancer Screening  Discontinued    Health Maintenance  Health Maintenance Due  Topic Date Due   Diabetic kidney evaluation - Urine ACR  08/21/2022   INFLUENZA VACCINE  10/14/2022   COVID-19 Vaccine (6 - 2023-24 season) 11/14/2022    Colorectal  cancer screening: Type of screening: Colonoscopy. Completed 2024. Repeat every 10 years  Lung Cancer Screening: (Low Dose CT Chest recommended if Age 39-80 years, 20 pack-year currently smoking OR have quit w/in 15years.) does qualify.   Lung Cancer Screening Referral: due 03-2023  Additional Screening:  Hepatitis C Screening never done  Vision Screening: Recommended annual ophthalmology exams for early detection of glaucoma and other disorders of the eye. Is the patient up to date with their annual eye exam?  No  Who is the provider or what is the name of the office in which the patient attends annual eye exams?  If pt is not established with a provider, would they like to be referred to a provider to establish care? No .   Dental Screening: Recommended annual dental exams for proper oral hygiene  Nutrition Risk Assessment:  Has the patient had any N/V/D within the last 2 months?  No  Does the patient have any non-healing wounds?  No  Has the patient had any unintentional weight loss or weight gain?  No   Diabetes:  Is the patient diabetic?  Yes  If diabetic, was a CBG obtained today?  No  Did the patient  bring in their glucometer from home?  No  How often do you monitor your CBG's? 2 x a day.   Financial Strains and Diabetes Management:  Are you having any financial strains with the device, your supplies or your medication? No .  Does the patient want to be seen by Chronic Care Management for management of their diabetes?  No  Would the patient like to be referred to a Nutritionist or for Diabetic Management?  No   Diabetic Exams:  Diabetic Eye Exam: .  Pt has been advised about the importance in completing this exam. A referral has been placed today  Diabetic Foot Exam. Pt has been advised about the importance in completing this exam.    Community Resource Referral / Chronic Care Management: CRR required this visit?  No   CCM required this visit?  No     Plan:     I have personally reviewed and noted the following in the patient's chart:   Medical and social history Use of alcohol, tobacco or illicit drugs  Current medications and supplements including opioid prescriptions. Patient is not currently taking opioid prescriptions. Functional ability and status Nutritional status Physical activity Advanced directives List of other physicians Hospitalizations, surgeries, and ER visits in previous 12 months Vitals Screenings to include cognitive, depression, and falls Referrals and appointments  In addition, I have reviewed and discussed with patient certain preventive protocols, quality metrics, and best practice recommendations. A written personalized care plan for preventive services as well as general preventive health recommendations were provided to patient.     Remi Haggard, LPN   [phone]   After Visit Summary: (MyChart) Due to this being a telephonic visit, the after visit summary with patients personalized plan was offered to patient via MyChart   Nurse Notes:

## 2022-12-07 NOTE — Progress Notes (Signed)
Subjective:   Jonathan Gilmore is a 72 y.o. male who presents for Medicare Annual/Subsequent preventive examination.  Visit Complete: Virtual  I connected with  [name] on 9-06-15-22 by a audio enabled telemedicine application and verified that I am speaking with the correct person using two identifiers.  Patient Location: Home  Provider Location: Home Office  I discussed the limitations of evaluation and management by telemedicine. The patient expressed understanding and agreed to proceed.  Vital Signs: Unable to obtain new vitals due to this being a telehealth visit.   Review of Systems     Cardiac Risk Factors include: advanced age (>28men, >89 women);male gender;obesity (BMI >30kg/m2);hypertension     Objective:    Today's Vitals   11/17/22 1330  PainSc: 6    There is no height or weight on file to calculate BMI.     11/17/2022    2:06 PM 06/30/2022   12:14 PM 06/08/2022   12:33 PM 06/08/2022   12:31 PM 06/08/2022   12:30 PM 04/03/2022    6:28 PM 04/02/2022    7:19 PM  Advanced Directives  Does Patient Have a Medical Advance Directive? No Yes No No No Yes No  Type of Special educational needs teacher of [place];Living will    Living will   Does patient want to make changes to medical advance directive?      No - Patient declined   Copy of Healthcare Power of Attorney in Chart?  No - copy requested       Would patient like information on creating a medical advance directive? No - Patient declined  No - Patient declined No - Patient declined No - Patient declined      Current Medications (verified) Outpatient Encounter Medications as of 11/17/2022  Medication Sig   ACCU-CHEK AVIVA PLUS test strip USE AS INSTRUCTED   aspirin EC 81 MG tablet Take 1 tablet (81 mg total) by mouth daily.   atorvastatin (LIPITOR) 80 MG tablet TAKE 1 TABLET BY MOUTH DAILY   Calcium-Magnesium-Zinc (CAL-MAG-ZINC PO) Take 1 tablet by mouth daily.   empagliflozin (JARDIANCE) 10 MG TABS  tablet Take 1 tablet (10 mg total) by mouth daily before breakfast.   ferrous sulfate 325 (65 FE) MG tablet Take 1 tablet (325 mg total) by mouth daily with breakfast.   folic acid (FOLVITE) 1 MG tablet Take 1 tablet (1 mg total) by mouth daily.   furosemide (LASIX) 40 MG tablet Take 1 tablet (40 mg total) by mouth as needed for edema (swelling).   gabapentin (NEURONTIN) 300 MG capsule TAKE 1 TO 2 CAPSULES BY MOUTH  TWICE DAILY   lisinopril (ZESTRIL) 20 MG tablet Take 1 tablet (20 mg total) by mouth daily.   magnesium oxide (MAG-OX) 400 MG tablet Take 400 mg by mouth at bedtime.   metFORMIN (GLUCOPHAGE) 500 MG tablet TAKE 2 TABLETS BY MOUTH TWICE  DAILY   metoprolol succinate (TOPROL-XL) 50 MG 24 hr tablet Take 1 tablet (50 mg total) by mouth daily.   Multiple Vitamin (MULTIVITAMIN WITH MINERALS) TABS Take 1 tablet by mouth daily.   nitroGLYCERIN (NITROSTAT) 0.4 MG SL tablet Place 1 tablet (0.4 mg total) under the tongue every 5 (five) minutes x 3 doses as needed for chest pain (if no relief after 2nd dose, proceed to ED for an evaluation).   pantoprazole (PROTONIX) 40 MG tablet Take 1 tablet (40 mg total) by mouth daily.   No facility-administered encounter medications on file as of 11/17/2022.  Allergies (verified) Morphine and codeine   History: Past Medical History:  Diagnosis Date   Abnormal cardiovascular stress test 03/2013   Anxiety    Arthritis    Back pain    Cancer (HCC)    Prostate cancer-no treatment per pt   CHF (congestive heart failure) (HCC)    Coronary artery disease    Multivessel with significant LAD involvement by chest CT 2014   Depression    Diastolic dysfunction    Essential hypertension    Lumbar herniated disc    L4-L5   MRSA (methicillin resistant staph aureus) culture positive    Myocardial infarction (HCC)    Neuropathy    Shortness of breath dyspnea    Sleep apnea    No cpap   Type 2 diabetes mellitus (HCC)    Past Surgical History:   Procedure Laterality Date   ANTERIOR CERVICAL DECOMP/DISCECTOMY FUSION  01/18/2011   Procedure: ANTERIOR CERVICAL DECOMPRESSION/DISCECTOMY FUSION 2 LEVELS;  Surgeon: Kathaleen Maser Pool;  Location: [facility];  Service: Neurosurgery;  Laterality: N/A;  anterior cervical discectomy and fusion with allograft and plating, cervical five-six, cervical six-seven   BIOPSY N/A 04/04/2015   Procedure: BIOPSY;  Surgeon: Malissa Hippo, MD;  Location: [facility];  Service: Endoscopy;  Laterality: N/A;   BIOPSY  11/09/2019   Procedure: BIOPSY;  Surgeon: Dolores Frame, MD;  Location: [facility];  Service: Gastroenterology;;   Bone spur removed  1997   Left shoulder   CARDIAC CATHETERIZATION N/A 08/08/2015   Procedure: Right/Left Heart Cath and Coronary Angiography;  Surgeon: Tonny Bollman, MD;  Location: [facility] INVAS[facility] CV LAB;  Service: Cardiovascular;  Laterality: N/A;   CARPAL TUNNEL RELEASE Right    COLONOSCOPY WITH PROPOFOL N/A 11/09/2019   Procedure: COLONOSCOPY WITH PROPOFOL;  Surgeon: Dolores Frame, MD;  Location: [facility];  Service: Gastroenterology;  Laterality: N/A;  900   COLONOSCOPY WITH PROPOFOL N/A 02/27/2021   Procedure: COLONOSCOPY WITH PROPOFOL;  Surgeon: Dolores Frame, MD;  Location: [facility];  Service: Gastroenterology;  Laterality: N/A;  7:30   COLONOSCOPY WITH PROPOFOL N/A 04/05/2022   Procedure: COLONOSCOPY WITH PROPOFOL;  Surgeon: Napoleon Form, MD;  Location: [facility];  Service: Gastroenterology;  Laterality: N/A;   ENTEROSCOPY N/A 06/30/2022   Procedure: ENTEROSCOPY;  Surgeon: Dolores Frame, MD;  [facility]: [facility];  Service: Gastroenterology;  Laterality: N/A;  2:00pm;ASA 3   ESOPHAGEAL DILATION N/A 04/04/2015   Procedure: ESOPHAGEAL DILATION;  Surgeon: Malissa Hippo, MD;  [facility]: [facility];  Service: Endoscopy;  Laterality: N/A;   ESOPHAGEAL DILATION N/A 11/09/2019   Procedure: ESOPHAGEAL DILATION;   Surgeon: Dolores Frame, MD;  [facility];  Service: Gastroenterology;  Laterality: N/A;   ESOPHAGOGASTRODUODENOSCOPY N/A 04/04/2015   Procedure: ESOPHAGOGASTRODUODENOSCOPY (EGD);  Surgeon: Malissa Hippo, MD;  [facility];  Service: Endoscopy;  Laterality: N/A;  1240   ESOPHAGOGASTRODUODENOSCOPY (EGD) WITH PROPOFOL N/A 11/09/2019   Procedure: ESOPHAGOGASTRODUODENOSCOPY (EGD) WITH PROPOFOL;  Surgeon: Dolores Frame, MD;  [facility];  Service: Gastroenterology;  Laterality: N/A;   ESOPHAGOGASTRODUODENOSCOPY (EGD) WITH PROPOFOL N/A 04/04/2022   Procedure: ESOPHAGOGASTRODUODENOSCOPY (EGD) WITH PROPOFOL;  Surgeon: Benancio Deeds, MD;  Location[facility];  Service: Gastroenterology;  Laterality: N/A;   ESOPHAGOGASTRODUODENOSCOPY (EGD) WITH PROPOFOL  06/30/2022   Procedure: ESOPHAGOGASTRODUODENOSCOPY (EGD) WITH PROPOFOL;  Surgeon: Dolores Frame, MD;  [facility];  Service: Gastroenterology;;   GIVENS CAPSULE STUDY N/A  05/11/2022   Procedure: GIVENS CAPSULE STUDY;  Surgeon: Dolores Frame, MD;  [facility]: [facility];  Service: Gastroenterology;  Laterality: N/A;  7:30 am   HEMORRHOID SURGERY     HOT HEMOSTASIS  06/30/2022   Procedure: HOT HEMOSTASIS (ARGON PLASMA COAGULATION/BICAP);  Surgeon: Marguerita Merles, Reuel Boom, MD;  Location: [facility];  Service: Gastroenterology;;   POLYPECTOMY  11/09/2019   Procedure: POLYPECTOMY;  Surgeon: Marguerita Merles, Reuel Boom, MD;  [facility];  Service: Gastroenterology;;   POLYPECTOMY  02/27/2021   Procedure: POLYPECTOMY;  Surgeon: Marguerita Merles, Reuel Boom, MD;  [facility];  Service: Gastroenterology;;   POLYPECTOMY  04/05/2022   Procedure: POLYPECTOMY;  Surgeon: Napoleon Form, MD;  Location: [facility] ENDOS[facility];  Service: Gastroenterology;;   TONSILLECTOMY     Family History  Problem Relation Age of Onset   Lung cancer Mother     Diabetes Father    Hepatitis B Father    Diabetes Sister    Cushing syndrome Sister    Social History   Socioeconomic History   Marital status: Married    Spouse name: Not on file   Number of children: Not on file   Years of education: Not on file   Highest education level: Not on file  Occupational History   Not on file  Tobacco Use   Smoking status: Every Day    Current packs/day: 1.00    Average packs/day: 1 pack/day for 55.0 years (55.0 ttl pk-yrs)    Types: Cigarettes    Start date: 11/26/1967   Smokeless tobacco: Former   Tobacco comments:    smoking x 50 yrs  Vaping Use   Vaping status: Never Used  Substance and Sexual Activity   Alcohol use: Not Currently    Comment: rare   Drug use: Yes    Frequency: 5.0 times per week    Types: Marijuana    Comment: as needed   Sexual activity: Yes  Other Topics Concern   Not on file  Social History Narrative   Lives with wife   Right Handed   Drinks 2-3 cups caffeine daily   Social Determinants of Health   Financial Resource Strain: Low Risk  (11/17/2022)   Overall Financial Resource Strain (CARDIA)    Difficulty of Paying Living Expenses: Not hard at all  Food Insecurity: No Food Insecurity (11/17/2022)   Hunger Vital Sign    Worried About Running Out of Food in the Last Year: Never true    Ran Out of Food in the Last Year: Never true  Transportation Needs: No Transportation Needs (11/17/2022)   PRAPARE - Administrator, Civil Service (Medical): No    Lack of Transportation (Non-Medical): No  Physical Activity: Inactive (11/17/2022)   Exercise Vital Sign    Days of Exercise per Week: 0 days    Minutes of Exercise per Session: 0 min  Stress: No Stress Concern Present (11/17/2022)   [employer] of Occupational Health - Occupational Stress Questionnaire    Feeling of Stress : Not at all  Social Connections: Moderately Integrated (11/17/2022)   Social Connection and Isolation Panel [NHANES]    Frequency of  Communication with Friends and Family: More than three times a week    Frequency of Social Gatherings with Friends and Family: More than three times a week    Attends Religious Services: Never    Database administrator or Organizations: Yes    Attends Engineer, structural: More than 4  times per year    Marital Status: Married    Tobacco Counseling Ready to quit: Not Answered Counseling given: Not Answered Tobacco comments: smoking x 50 yrs   Clinical Intake:  Pre-visit preparation completed: Yes  Pain : 0-10 Pain Score: 6  Pain Type: Chronic pain Pain Location: Back (shoulder R) Pain Descriptors / Indicators: Burning, Aching, Sharp Pain Onset: More than a month ago Pain Frequency: Constant     Diabetes: Yes CBG done?: No Did pt. bring in CBG monitor from home?: No  How often do you need to have someone help you when you read instructions, pamphlets, or other written materials from your doctor or pharmacy?: 1 - Never  Interpreter Needed?: No  Information entered by :: Remi Haggard LPN   Activities of Daily Living    11/23/2022    8:50 AM 11/17/2022    8:53 AM  In your present state of health, do you have any difficulty performing the following activities:  Hearing? 0 0  Vision? 0 0  Difficulty concentrating or making decisions? 0 0  Walking or climbing stairs? 0 0  Dressing or bathing? 0 0  Doing errands, shopping? 0 0  Preparing Food and eating ? N   Using the Toilet? N   In the past six months, have you accidently leaked urine? N   Do you have problems with loss of bowel control? N   Managing your Medications? N   Managing your Finances? N   Housekeeping or managing your Housekeeping? N     Patient Care Team: Shade Flood, MD as PCP - General (Family Medicine) Wyline Mood Dorothe Pea, MD as PCP - Cardiology (Cardiology) Marguerita Merles, Reuel Boom, MD as Consulting Physician (Gastroenterology)  Indicate any recent Medical Services you may have  received from other than [facility] providers in the past year (date may be approximate).     Assessment:   This is a routine wellness examination for Hillel.  Hearing/Vision screen Hearing Screening - Comments:: No trouble hearing Vision Screening - Comments:: Unsure of name  Dietary issues and exercise activities discussed:     Goals Addressed             This Visit's Progress    Patient Stated       Continue current lifestyle       Depression Screen    11/17/2022    1:37 PM 08/19/2022   10:52 AM 04/19/2022   11:01 AM 01/28/2022   10:34 AM 01/20/2022    1:09 PM 12/17/2021   11:15 AM 10/22/2021   11:38 AM  PHQ 2/9 Scores  PHQ - 2 Score 2 0 0 0 0 0 0  PHQ- 9 Score 5 0 0 7 4 0     Fall Risk    11/17/2022    1:34 PM 08/19/2022   10:52 AM 04/19/2022   11:01 AM 01/28/2022   10:34 AM 01/20/2022    1:00 PM  Fall Risk   Falls in the past year? 0 0 0 1 0  Number falls in past yr: 0 0 0 1 0  Injury with Fall? 0 0 0 0 0  Risk for fall due to :  No Fall Risks No Fall Risks History of fall(s)   Follow up Falls evaluation completed;Education provided;Falls prevention discussed Falls evaluation completed Falls evaluation completed Falls evaluation completed Falls evaluation completed;Education provided;Falls prevention discussed    MEDICARE RISK AT HOME: Medicare Risk at Home Any stairs in or around the home?: No If so,  are there any without handrails?: No Home free of loose throw rugs in walkways, pet beds, electrical cords, etc?: Yes Adequate lighting in your home to reduce risk of falls?: Yes Life alert?: No Use of a cane, walker or w/c?: No Grab bars in the bathroom?: No Shower chair or bench in shower?: No Elevated toilet seat or a handicapped toilet?: No  TIMED UP AND GO:  Was the test performed?  No    Cognitive Function:    11/03/2020    1:15 PM  MMSE - Mini Mental State Exam  Orientation to time 5  Orientation to Place 5  Registration 3  Attention/ Calculation 5   Recall 3  Language- name 2 objects 2  Language- repeat 0  Language- follow 3 step command 3  Language- read & follow direction 1  Write a sentence 0  Copy design 0  Total score 27        11/17/2022    1:33 PM 01/20/2022    1:06 PM  6CIT Screen  What Year? 0 points 0 points  What month? 0 points 0 points  What time? 0 points 0 points  Count back from 20 0 points 0 points  Months in reverse 0 points 0 points  Repeat phrase 0 points 0 points  Total Score 0 points 0 points    Immunizations Immunization History  Administered Date(s) Administered   Fluad Quad(high Dose 65+) 11/29/2018   Influenza Split 01/19/2011   Influenza, High Dose Seasonal PF 01/05/2017, 01/15/2018   Influenza,inj,Quad PF,6+ Mos 02/25/2013   Moderna Sars-Covid-2 Vaccination 04/19/2019, 05/18/2019, 01/11/2020, 07/24/2020, 12/05/2020   Pneumococcal Conjugate-13 11/29/2018   Pneumococcal Polysaccharide-23 02/25/2013, 10/22/2021   Tdap 08/13/2020    TDAP status: Up to date  Flu Vaccine status: Due, Education has been provided regarding the importance of this vaccine. Advised may receive this vaccine at local pharmacy or Health Dept. Aware to provide a copy of the vaccination record if obtained from local pharmacy or Health Dept. Verbalized acceptance and understanding.  Pneumococcal vaccine status: Up to date  Covid-19 vaccine status: Information provided on how to obtain vaccines.   Qualifies for Shingles Vaccine? Yes   Zostavax completed No   Shingrix Completed?: No.    Education has been provided regarding the importance of this vaccine. Patient has been advised to call insurance company to determine out of pocket expense if they have not yet received this vaccine. Advised may also receive vaccine at local pharmacy or Health Dept. Verbalized acceptance and understanding.  Screening Tests Health Maintenance  Topic Date Due   Diabetic kidney evaluation - Urine ACR  08/21/2022   INFLUENZA VACCINE   10/14/2022   COVID-19 Vaccine (6 - 2023-24 season) 11/14/2022   Hepatitis C Screening  01/29/2023 (Originally 11/25/1968)   Zoster Vaccines- Shingrix (1 of 2) 08/14/2023 (Originally 11/25/1969)   FOOT EXAM  01/29/2023   HEMOGLOBIN A1C  02/18/2023   OPHTHALMOLOGY EXAM  04/14/2023   Diabetic kidney evaluation - eGFR measurement  08/19/2023   Medicare Annual Wellness (AWV)  11/17/2023   DTaP/Tdap/Td (2 - Td or Tdap) 08/14/2030   Colonoscopy  04/05/2032   Pneumonia Vaccine 29+ Years old  Completed   HPV VACCINES  Aged Out   Lung Cancer Screening  Discontinued    Health Maintenance  Health Maintenance Due  Topic Date Due   Diabetic kidney evaluation - Urine ACR  08/21/2022   INFLUENZA VACCINE  10/14/2022   COVID-19 Vaccine (6 - 2023-24 season) 11/14/2022    Colorectal  cancer screening: Type of screening: Colonoscopy. Completed 2024. Repeat every 10 years  Lung Cancer Screening: (Low Dose CT Chest recommended if Age 39-80 years, 20 pack-year currently smoking OR have quit w/in 15years.) does qualify.   Lung Cancer Screening Referral: due 03-2023  Additional Screening:  Hepatitis C Screening never done  Vision Screening: Recommended annual ophthalmology exams for early detection of glaucoma and other disorders of the eye. Is the patient up to date with their annual eye exam?  No  Who is the provider or what is the name of the office in which the patient attends annual eye exams?  If pt is not established with a provider, would they like to be referred to a provider to establish care? No .   Dental Screening: Recommended annual dental exams for proper oral hygiene  Nutrition Risk Assessment:  Has the patient had any N/V/D within the last 2 months?  No  Does the patient have any non-healing wounds?  No  Has the patient had any unintentional weight loss or weight gain?  No   Diabetes:  Is the patient diabetic?  Yes  If diabetic, was a CBG obtained today?  No  Did the patient  bring in their glucometer from home?  No  How often do you monitor your CBG's? 2 x a day.   Financial Strains and Diabetes Management:  Are you having any financial strains with the device, your supplies or your medication? No .  Does the patient want to be seen by Chronic Care Management for management of their diabetes?  No  Would the patient like to be referred to a Nutritionist or for Diabetic Management?  No   Diabetic Exams:  Diabetic Eye Exam: .  Pt has been advised about the importance in completing this exam. A referral has been placed today  Diabetic Foot Exam. Pt has been advised about the importance in completing this exam.    Community Resource Referral / Chronic Care Management: CRR required this visit?  No   CCM required this visit?  No     Plan:     I have personally reviewed and noted the following in the patient's chart:   Medical and social history Use of alcohol, tobacco or illicit drugs  Current medications and supplements including opioid prescriptions. Patient is not currently taking opioid prescriptions. Functional ability and status Nutritional status Physical activity Advanced directives List of other physicians Hospitalizations, surgeries, and ER visits in previous 12 months Vitals Screenings to include cognitive, depression, and falls Referrals and appointments  In addition, I have reviewed and discussed with patient certain preventive protocols, quality metrics, and best practice recommendations. A written personalized care plan for preventive services as well as general preventive health recommendations were provided to patient.     Remi Haggard, LPN   [phone]   After Visit Summary: (MyChart) Due to this being a telephonic visit, the after visit summary with patients personalized plan was offered to patient via MyChart   Nurse Notes:

## 2022-12-19 ENCOUNTER — Other Ambulatory Visit: Payer: Self-pay | Admitting: Family Medicine

## 2022-12-19 DIAGNOSIS — E1159 Type 2 diabetes mellitus with other circulatory complications: Secondary | ICD-10-CM

## 2023-01-24 ENCOUNTER — Ambulatory Visit: Payer: Medicare Other | Admitting: Orthopedic Surgery

## 2023-01-27 ENCOUNTER — Ambulatory Visit: Payer: Medicare Other | Admitting: Orthopedic Surgery

## 2023-01-31 ENCOUNTER — Ambulatory Visit (INDEPENDENT_AMBULATORY_CARE_PROVIDER_SITE_OTHER): Payer: Medicare Other | Admitting: Gastroenterology

## 2023-02-08 ENCOUNTER — Ambulatory Visit (INDEPENDENT_AMBULATORY_CARE_PROVIDER_SITE_OTHER): Payer: Medicare Other | Admitting: Gastroenterology

## 2023-02-08 ENCOUNTER — Encounter (INDEPENDENT_AMBULATORY_CARE_PROVIDER_SITE_OTHER): Payer: Self-pay | Admitting: Gastroenterology

## 2023-02-08 VITALS — BP 151/77 | HR 94 | Temp 98.2°F | Ht 67.0 in | Wt 188.0 lb

## 2023-02-08 DIAGNOSIS — K31819 Angiodysplasia of stomach and duodenum without bleeding: Secondary | ICD-10-CM | POA: Diagnosis not present

## 2023-02-08 DIAGNOSIS — R142 Eructation: Secondary | ICD-10-CM | POA: Diagnosis not present

## 2023-02-08 DIAGNOSIS — D509 Iron deficiency anemia, unspecified: Secondary | ICD-10-CM | POA: Diagnosis not present

## 2023-02-08 DIAGNOSIS — D500 Iron deficiency anemia secondary to blood loss (chronic): Secondary | ICD-10-CM

## 2023-02-08 DIAGNOSIS — K3189 Other diseases of stomach and duodenum: Secondary | ICD-10-CM

## 2023-02-08 NOTE — Progress Notes (Signed)
Referring Provider: Shade Flood, MD Primary Care Physician:  Shade Flood, MD Primary GI Physician: Dr. Levon Hedger   Chief Complaint  Patient presents with   Gastroesophageal Reflux    Follow up on GERD. Takes pantoprazole as needed and not daily. States doing well.    Anemia    Follow up on anemia. Takes iron once daily.    Gas    Having some concerns about having a lot of gas.    HPI:   Jonathan Gilmore is a 72 y.o. male with past medical history of anxiety, diabetes, moderate aortic stenosis, CHF, HTN, depression, CAD, history of AVMs in the small bowel  Patient presenting today for follow up of IDA   Last seen may 2024, at that time hemoglobin in March was 11.9.  Most recent iron studies in January with iron of 31, ferritin 9 iron saturation 8%.  Patient taking iron pills daily.  Reports some fatigue but this is not new.  Denied any GI complaints.  Patient recommended check CBC and iron studies, continue daily iron, follow-up with Dr. Wyline Mood regarding TAVR.  Most recent labs in June with hemoglobin of 13.9, iron 53, TIBC 349, iron sat 15%, ferritin 15  Present:  Patient states more belching recently. Denies any  nausea, vomiting or abdominal pain. He notes he has only taken protonix 40mg  maybe 6 times since May. He notes he only takes this if he has some burning in his stomach. Typically avoids spicy foods but sometimes if he eats something that is spicier than he anticipated he will need this.   Denies blood in stools or black stools. Taking PO iron BID. No labs done since June. Denies SOB or dizziness. He has some chronic fatigue but does not think this is worse than baseline. Feels appetite is not as good as previously, forgets to eat at times. Weight stable. No constipation or diarrhea.   States no update on TAVR procedure with cardiology, he is seeing them again next week in follow up.   Last EGD: 04/04/2022 showed a normal esophagus, stomach, duodenum.  Last   Colonoscopy: 04/05/22 showed a 5 mm polyp in the cecum ( had hyperplastic changes).    Capsule study: 04/2022 Presence of nonbleeding AVMs in the small bowel, predominantly in the proximal small bowel.  Enteroscopy 06/2022 - 2 cm hiatal hernia.                           - Erosive gastropathy with no stigmata of recent                            bleeding.                           - Two non-bleeding angiodysplastic lesions in the                            duodenum. Treated with argon plasma coagulation                            ([facility]).                           - No specimens collected.  Previous  Colonoscopies: 02/2021-presents anal condyloma, 7 polyps  were found in the transverse, ascending colon and cecum, polyps measured between 4 to 10 mm.  There was presence of moderate amount of semisolid stool All polyps were tubular adenomas   10/2019 - fair colon prep -he had a total of 5 polyps removed from the colon with polyps up to 20 mm x 2 and a 12 mm polyps x1 (pathology consistent with tubular adenomas), diverticulosis    Past Medical History:  Diagnosis Date   Abnormal cardiovascular stress test 03/2013   Anxiety    Arthritis    Back pain    Cancer (HCC)    Prostate cancer-no treatment per pt   CHF (congestive heart failure) (HCC)    Coronary artery disease    Multivessel with significant LAD involvement by chest CT 2014   Depression    Diastolic dysfunction    Essential hypertension    Lumbar herniated disc    L4-L5   MRSA (methicillin resistant staph aureus) culture positive    Myocardial infarction (HCC)    Neuropathy    Shortness of breath dyspnea    Sleep apnea    No cpap   Type 2 diabetes mellitus (HCC)     Past Surgical History:  Procedure Laterality Date   ANTERIOR CERVICAL DECOMP/DISCECTOMY FUSION  01/18/2011   Procedure: ANTERIOR CERVICAL DECOMPRESSION/DISCECTOMY FUSION 2 LEVELS;  Surgeon: Kathaleen Maser Pool;  Location: [facility];  Service: Neurosurgery;  Laterality:  N/A;  anterior cervical discectomy and fusion with allograft and plating, cervical five-six, cervical six-seven   BIOPSY N/A 04/04/2015   Procedure: BIOPSY;  Surgeon: Malissa Hippo, MD;  Location: [facility];  Service: Endoscopy;  Laterality: N/A;   BIOPSY  11/09/2019   Procedure: BIOPSY;  Surgeon: Dolores Frame, MD;  Location: [facility];  Service: Gastroenterology;;   Bone spur removed  1997   Left shoulder   CARDIAC CATHETERIZATION N/A 08/08/2015   Procedure: Right/Left Heart Cath and Coronary Angiography;  Surgeon: Tonny Bollman, MD;  Location: [facility];  Service: Cardiovascular;  Laterality: N/A;   CARPAL TUNNEL RELEASE Right    COLONOSCOPY WITH PROPOFOL N/A 11/09/2019   Procedure: COLONOSCOPY WITH PROPOFOL;  Surgeon: Dolores Frame, MD;  Location: [facility];  Service: Gastroenterology;  Laterality: N/A;  900   COLONOSCOPY WITH PROPOFOL N/A 02/27/2021   Procedure: COLONOSCOPY WITH PROPOFOL;  Surgeon: Dolores Frame, MD;  Location: [facility];  Service: Gastroenterology;  Laterality: N/A;  7:30   COLONOSCOPY WITH PROPOFOL N/A 04/05/2022   Procedure: COLONOSCOPY WITH PROPOFOL;  Surgeon: Napoleon Form, MD;  Location: [facility];  Service: Gastroenterology;  Laterality: N/A;   ENTEROSCOPY N/A 06/30/2022   Procedure: ENTEROSCOPY;  Surgeon: Dolores Frame, MD;  Location: [facility];  Service: Gastroenterology;  Laterality: N/A;  2:00pm;ASA 3   ESOPHAGEAL DILATION N/A 04/04/2015   Procedure: ESOPHAGEAL DILATION;  Surgeon: Malissa Hippo, MD;  [facility]: [facility];  Service: Endoscopy;  Laterality: N/A;   ESOPHAGEAL DILATION N/A 11/09/2019   Procedure: ESOPHAGEAL DILATION;  Surgeon: Dolores Frame, MD;  [facility]: [facility];  Service: Gastroenterology;  Laterality: N/A;   ESOPHAGOGASTRODUODENOSCOPY N/A 04/04/2015   Procedure: ESOPHAGOGASTRODUODENOSCOPY (EGD);  Surgeon: Malissa Hippo, MD;  [facility];  Service: Endoscopy;  Laterality: N/A;  1240   ESOPHAGOGASTRODUODENOSCOPY (EGD) WITH PROPOFOL N/A 11/09/2019   Procedure: ESOPHAGOGASTRODUODENOSCOPY (EGD) WITH PROPOFOL;  Surgeon: Dolores Frame, MD;  [facility]: [facility];  Service: Gastroenterology;  Laterality: N/A;  ESOPHAGOGASTRODUODENOSCOPY (EGD) WITH PROPOFOL N/A 04/04/2022   Procedure: ESOPHAGOGASTRODUODENOSCOPY (EGD) WITH PROPOFOL;  Surgeon: Benancio Deeds, MD;  Location[facility];  Service: Gastroenterology;  Laterality: N/A;   ESOPHAGOGASTRODUODENOSCOPY (EGD) WITH PROPOFOL  06/30/2022   Procedure: ESOPHAGOGASTRODUODENOSCOPY (EGD) WITH PROPOFOL;  Surgeon: Dolores Frame, MD;  [facility];  Service: Gastroenterology;;   GIVENS CAPSULE STUDY N/A 05/11/2022   Procedure: GIVENS CAPSULE STUDY;  Surgeon: Dolores Frame, MD;  [facility]: [facility];  Service: Gastroenterology;  Laterality: N/A;  7:30 am   HEMORRHOID SURGERY     HOT HEMOSTASIS  06/30/2022   Procedure: HOT HEMOSTASIS (ARGON PLASMA COAGULATION/BICAP);  Surgeon: Marguerita Merles, Reuel Boom, MD;  Location: [facility];  Service: Gastroenterology;;   POLYPECTOMY  11/09/2019   Procedure: POLYPECTOMY;  Surgeon: Dolores Frame, MD;  [facility]: [facility];  Service: Gastroenterology;;   POLYPECTOMY  02/27/2021   Procedure: POLYPECTOMY;  Surgeon: Marguerita Merles, Reuel Boom, MD;  Location: [facility];  Service: Gastroenterology;;   POLYPECTOMY  04/05/2022   Procedure: POLYPECTOMY;  Surgeon: Napoleon Form, MD;  Location: [facility];  Service: Gastroenterology;;   TONSILLECTOMY      Current Outpatient Medications  Medication Sig Dispense Refill   ACCU-CHEK AVIVA PLUS test strip USE AS INSTRUCTED 100 strip 3   aspirin EC 81 MG tablet Take 1 tablet (81 mg total) by mouth daily. 30 tablet 11   atorvastatin (LIPITOR) 80 MG tablet TAKE 1 TABLET BY MOUTH DAILY 90 tablet 3   Calcium-Magnesium-Zinc  (CAL-MAG-ZINC PO) Take 1 tablet by mouth daily.     ferrous sulfate 325 (65 FE) MG tablet Take 1 tablet (325 mg total) by mouth daily with breakfast. 90 tablet 3   folic acid (FOLVITE) 1 MG tablet Take 1 tablet (1 mg total) by mouth daily. 30 tablet 0   furosemide (LASIX) 40 MG tablet Take 1 tablet (40 mg total) by mouth as needed for edema (swelling).     gabapentin (NEURONTIN) 300 MG capsule TAKE 1 TO 2 CAPSULES BY MOUTH  TWICE DAILY 360 capsule 3   JARDIANCE 10 MG TABS tablet TAKE 1 TABLET BY MOUTH DAILY  BEFORE BREAKFAST 90 tablet 3   lisinopril (ZESTRIL) 20 MG tablet Take 1 tablet (20 mg total) by mouth daily. 90 tablet 3   magnesium oxide (MAG-OX) 400 MG tablet Take 400 mg by mouth at bedtime.     meloxicam (MOBIC) 7.5 MG tablet Take 1 tablet (7.5 mg total) by mouth daily. 30 tablet 5   metFORMIN (GLUCOPHAGE) 500 MG tablet TAKE 2 TABLETS BY MOUTH TWICE  DAILY 360 tablet 3   metoprolol succinate (TOPROL-XL) 50 MG 24 hr tablet Take 1 tablet (50 mg total) by mouth daily. 90 tablet 3   Multiple Vitamin (MULTIVITAMIN WITH MINERALS) TABS Take 1 tablet by mouth daily.     pantoprazole (PROTONIX) 40 MG tablet Take 1 tablet (40 mg total) by mouth daily. 90 tablet 3   nitroGLYCERIN (NITROSTAT) 0.4 MG SL tablet Place 1 tablet (0.4 mg total) under the tongue every 5 (five) minutes x 3 doses as needed for chest pain (if no relief after 2nd dose, proceed to ED for an evaluation). (Patient not taking: Reported on 02/08/2023) 25 tablet 3   No current facility-administered medications for this visit.    Allergies as of 02/08/2023 - Review Complete 02/08/2023  Allergen Reaction Noted   Morphine and codeine Nausea And Vomiting 01/18/2011    Family History  Problem Relation Age of Onset  Lung cancer Mother    Diabetes Father    Hepatitis B Father    Diabetes Sister    Cushing syndrome Sister     Social History   Socioeconomic History   Marital status: Married    Spouse name: Not on file    Number of children: Not on file   Years of education: Not on file   Highest education level: Not on file  Occupational History   Not on file  Tobacco Use   Smoking status: Every Day    Current packs/day: 1.00    Average packs/day: 1 pack/day for 55.2 years (55.2 ttl pk-yrs)    Types: Cigarettes    Start date: 11/26/1967   Smokeless tobacco: Former   Tobacco comments:    smoking x 50 yrs  Vaping Use   Vaping status: Never Used  Substance and Sexual Activity   Alcohol use: Not Currently    Comment: rare   Drug use: Yes    Frequency: 5.0 times per week    Types: Marijuana    Comment: as needed   Sexual activity: Yes  Other Topics Concern   Not on file  Social History Narrative   Lives with wife   Right Handed   Drinks 2-3 cups caffeine daily   Social Determinants of Health   Financial Resource Strain: Low Risk  (11/17/2022)   Overall Financial Resource Strain (CARDIA)    Difficulty of Paying Living Expenses: Not hard at all  Food Insecurity: No Food Insecurity (11/17/2022)   Hunger Vital Sign    Worried About Running Out of Food in the Last Year: Never true    Ran Out of Food in the Last Year: Never true  Transportation Needs: No Transportation Needs (11/17/2022)   PRAPARE - Administrator, Civil Service (Medical): No    Lack of Transportation (Non-Medical): No  Physical Activity: Inactive (11/17/2022)   Exercise Vital Sign    Days of Exercise per Week: 0 days    Minutes of Exercise per Session: 0 min  Stress: No Stress Concern Present (11/17/2022)   [employer] of Occupational Health - Occupational Stress Questionnaire    Feeling of Stress : Not at all  Social Connections: Moderately Integrated (11/17/2022)   Social Connection and Isolation Panel [NHANES]    Frequency of Communication with Friends and Family: More than three times a week    Frequency of Social Gatherings with Friends and Family: More than three times a week    Attends Religious Services: Never     Database administrator or Organizations: Yes    Attends Engineer, structural: More than 4 times per year    Marital Status: Married    Review of systems General: negative for malaise, night sweats, fever, chills, weight loss Neck: Negative for lumps, goiter, pain and significant neck swelling Resp: Negative for cough, wheezing, dyspnea at rest CV: Negative for chest pain, leg swelling, palpitations, orthopnea GI: denies melena, hematochezia, nausea, vomiting, diarrhea, constipation, dysphagia, odyonophagia, early satiety or unintentional weight loss. +belching  MSK: Negative for joint pain or swelling, back pain, and muscle pain. Derm: Negative for itching or rash Psych: Denies depression, anxiety, memory loss, confusion. No homicidal or suicidal ideation.  Heme: Negative for prolonged bleeding, bruising easily, and swollen nodes. Endocrine: Negative for cold or heat intolerance, polyuria, polydipsia and goiter. Neuro: negative for tremor, gait imbalance, syncope and seizures. The remainder of the review of systems is noncontributory.  Physical Exam: BP Marland Kitchen)  151/77   Pulse 94   Temp 98.2 F (36.8 C) (Oral)   Ht 5\' 7"  (1.702 m)   Wt 188 lb (85.3 kg)   BMI 29.44 kg/m  General:   Alert and oriented. No distress noted. Pleasant and cooperative.  Head:  Normocephalic and atraumatic. Eyes:  Conjuctiva clear without scleral icterus. Mouth:  Oral mucosa pink and moist. Good dentition. No lesions. Heart: Normal rate and rhythm, s1 and s2 heart sounds present.  Lungs: Clear lung sounds in all lobes. Respirations equal and unlabored. Abdomen:  +BS, soft, non-tender and non-distended. No rebound or guarding. No HSM or masses noted. Derm: No palmar erythema or jaundice Msk:  Symmetrical without gross deformities. Normal posture. Extremities:  Without edema. Neurologic:  Alert and  oriented x4 Psych:  Alert and cooperative. Normal mood and affect.  Invalid input(s): "6 MONTHS"    ASSESSMENT: Jonathan Gilmore is a 72 y.o. male presenting today for follow up of IDA  IDA: extensive endoscopic workup as above with presence of erosive gastropathy and AVMs, advised to start protonix 40mg  BID, though he notes he has only taken this a few times since it was prescribed in May. Last hgb in June was 13.9, iron 53, TIBC 349, sat 15, ferritin 15 on labs done by PCP in June, he is taking PO iron BID. Denies rectal bleeding or melena.  Will continue with p.o. iron twice daily, will repeat H&H and iron studies today.  Given history of erosive gastropathy he needs to be maintained on PPI regularly, he also notes some belching and burning in his stomach at times, discussed that PPI should help the symptoms as well.  Given presence of AVMs he was encouraged to discuss candidacy for TAVR further with cardiology as this is especially important in this case given moderate aortic stenosis and recurrent AVMs.  He is seeing cardiology next week, I encouraged him to discuss this with them again at follow-up.   PLAN:  Take protonix 40mg  daily  Continue with PO iron pills BID  Repeat Iron studies and h&h Discuss TAVR with Cardiology next week.   All questions were answered, patient verbalized understanding and is in agreement with plan as outlined above.   Follow Up: 6 months   Nathin Saran L. Jeanmarie Hubert, MSN, APRN, AGNP-C Adult-Gerontology Nurse Practitioner [facility]  I have reviewed the note and agree with the APP's assessment as described in this progress note  Katrinka Blazing, MD Gastroenterology and Hepatology [facility] Gastroenterology

## 2023-02-08 NOTE — Patient Instructions (Addendum)
Take protonix 40mg  daily  Continue with iron pill twice daily We will repeat labs to check iron levels Please Discuss TAVR with Cardiology next week.   Follow up 6 months

## 2023-02-17 ENCOUNTER — Encounter: Payer: Self-pay | Admitting: Cardiology

## 2023-02-17 ENCOUNTER — Ambulatory Visit: Payer: Medicare Other | Attending: Cardiology | Admitting: Cardiology

## 2023-02-17 VITALS — BP 132/78 | HR 78 | Ht 67.0 in | Wt 183.6 lb

## 2023-02-17 DIAGNOSIS — I480 Paroxysmal atrial fibrillation: Secondary | ICD-10-CM

## 2023-02-17 DIAGNOSIS — E782 Mixed hyperlipidemia: Secondary | ICD-10-CM | POA: Diagnosis not present

## 2023-02-17 DIAGNOSIS — I10 Essential (primary) hypertension: Secondary | ICD-10-CM

## 2023-02-17 DIAGNOSIS — I2510 Atherosclerotic heart disease of native coronary artery without angina pectoris: Secondary | ICD-10-CM

## 2023-02-17 DIAGNOSIS — E1159 Type 2 diabetes mellitus with other circulatory complications: Secondary | ICD-10-CM | POA: Diagnosis not present

## 2023-02-17 MED ORDER — ATORVASTATIN CALCIUM 80 MG PO TABS
80.0000 mg | ORAL_TABLET | Freq: Every day | ORAL | 3 refills | Status: DC
Start: 2023-02-17 — End: 2023-12-27

## 2023-02-17 MED ORDER — LISINOPRIL 20 MG PO TABS: 20.0000 mg | ORAL_TABLET | Freq: Every day | ORAL | 3 refills | Status: DC

## 2023-02-17 MED ORDER — NITROGLYCERIN 0.4 MG SL SUBL: 0.4000 mg | SUBLINGUAL_TABLET | SUBLINGUAL | 3 refills | Status: DC | PRN

## 2023-02-17 NOTE — Patient Instructions (Signed)

## 2023-02-17 NOTE — Progress Notes (Signed)
Clinical Summary Mr. Fitzmorris is a 72 y.o.male seen today for follow up of the following medical problems.      1. CAD - CT scan chest 02/2013 with 3 vessel CAD, severe LAD disease. - Jan 2015 nuclear stress without clear ischemia - admit 07/2015 with acute pulmonary edema in setting of severe HTN. Referred for cath.  - cath 07/2015 with 50% LAD disease, overall patent coronaries - his presentation with pulmonary edema likely secondary to severe HTN in setting of restrictive diastolic dysfunction. - echo 07/2015 LVEF 45-50%, restrictive diastolic dysfunction 08/2020 echo LVEF 60-65%   - recent chest pains, some SOB - started 2 days ago. Pressure epigastric/midchest, 3/10 in severity. Some associated SOB few seconds.. Lasted a few hours constant, not positional. No association with food or eating.  - no repeat episodes -compliant with meds.      02/2021 nuclear stress: small inferior infarct, no current ischemia   - no chest pains. Some DOE with very high levels activity only - compliant with meds     2.Afib -new diagnosis 05/2022 - recent GI bleeding from AVMs, not started on anticoag  - no recent palpitations.      3. Severe OSA - not using CPAP machine due to discomfort. Stopped several years ago     4. Hyperlipidemia -11/2018 TC 82 HDL 25 TG 137 LDL 35 - he is compliant with statin   - 08/2019 TC 106 HDL 37 TG 100 LDL 51  - 04/2020 TC 103 HDL 35 TG 143 LDL 45 - 11/2020 TC 87 HDL 35 LDL 38 TG 69   08/2022 TC 96 TG 135 HDL 34 LDL 35 - compliant with meds   4. Aortic stenosis - echo 07/2015 mild to moderate. Mean gradient 16, AVA reported at 0.9 with dimensionless index 0.17 Mar 2018 echo LVEF 60-65%, mild to moderate stenosis   08/2020 echo LVEF 60-65%, no WMAs, grade I dd, mild MR, mild AI, mild to mod AS mean grade 17, AVA VTI 1.12 DI 0.29 - no recent symptoms.    Jan 2024 echo: LVEF 55-60%, moderate AS mean grad 23 AVA VTI 0.90 DI 0.26 SVI 38  - seen by PA  Strader, she discussed with hematology lab results for any form of von Willebrand's disease. Labs show no evidence and therefore no evidence of Heyde syndrome in relation to his AVMs     5. HTN - compliant withmeds     6. Chronic diastolic HF - no recent edema.    7. DM2  - followed by pcp     8. Claudication/PAD - followed by vascular - no recent claudication   9. Dysphagia - episode of food getting stuck, reports prior issues along with esophageal dilatation in the past. Asking to reestablish with GI, reports also due for colonscopy   10.Neuropathy   11. Prostate cancer - followed by urology   12. GI bleed - followed by GI, issues with AVMs.     AAA screen: 2017 [place] was normal   SH: his wife, Delvonta Rootes is also a patient of mine. 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  Past Medical History:  Diagnosis Date   Abnormal cardiovascular stress test 03/2013   Anxiety    Arthritis    Back pain    Cancer (HCC)    Prostate cancer-no treatment per pt   CHF (congestive heart failure) (  HCC)    Coronary artery disease    Multivessel with significant LAD involvement by chest CT 2014   Depression    Diastolic dysfunction    Essential hypertension    Lumbar herniated disc    L4-L5   MRSA (methicillin resistant staph aureus) culture positive    Myocardial infarction (HCC)    Neuropathy    Shortness of breath dyspnea    Sleep apnea    No cpap   Type 2 diabetes mellitus (HCC)      Allergies  Allergen Reactions   Morphine And Codeine Nausea And Vomiting     Current Outpatient Medications  Medication Sig Dispense Refill   ACCU-CHEK AVIVA PLUS test strip USE AS INSTRUCTED 100 strip 3   aspirin EC 81 MG tablet Take 1 tablet (81 mg total) by mouth daily. 30 tablet 11   Calcium-Magnesium-Zinc (CAL-MAG-ZINC PO) Take 1 tablet by mouth daily.     ferrous sulfate 325 (65 FE) MG  tablet Take 1 tablet (325 mg total) by mouth daily with breakfast. 90 tablet 3   folic acid (FOLVITE) 1 MG tablet Take 1 tablet (1 mg total) by mouth daily. 30 tablet 0   furosemide (LASIX) 40 MG tablet Take 1 tablet (40 mg total) by mouth as needed for edema (swelling).     gabapentin (NEURONTIN) 300 MG capsule TAKE 1 TO 2 CAPSULES BY MOUTH  TWICE DAILY 360 capsule 3   JARDIANCE 10 MG TABS tablet TAKE 1 TABLET BY MOUTH DAILY  BEFORE BREAKFAST 90 tablet 3   magnesium oxide (MAG-OX) 400 MG tablet Take 400 mg by mouth at bedtime.     meloxicam (MOBIC) 7.5 MG tablet Take 1 tablet (7.5 mg total) by mouth daily. 30 tablet 5   metFORMIN (GLUCOPHAGE) 500 MG tablet TAKE 2 TABLETS BY MOUTH TWICE  DAILY 360 tablet 3   metoprolol succinate (TOPROL-XL) 50 MG 24 hr tablet Take 1 tablet (50 mg total) by mouth daily. 90 tablet 3   Multiple Vitamin (MULTIVITAMIN WITH MINERALS) TABS Take 1 tablet by mouth daily.     pantoprazole (PROTONIX) 40 MG tablet Take 1 tablet (40 mg total) by mouth daily. 90 tablet 3   atorvastatin (LIPITOR) 80 MG tablet Take 1 tablet (80 mg total) by mouth daily. 90 tablet 3   lisinopril (ZESTRIL) 20 MG tablet Take 1 tablet (20 mg total) by mouth daily. 90 tablet 3   nitroGLYCERIN (NITROSTAT) 0.4 MG SL tablet Place 1 tablet (0.4 mg total) under the tongue every 5 (five) minutes x 3 doses as needed for chest pain (if no relief after 2nd dose, proceed to ED for an evaluation). 25 tablet 3   No current facility-administered medications for this visit.     Past Surgical History:  Procedure Laterality Date   ANTERIOR CERVICAL DECOMP/DISCECTOMY FUSION  01/18/2011   Procedure: ANTERIOR CERVICAL DECOMPRESSION/DISCECTOMY FUSION 2 LEVELS;  Surgeon: Kathaleen Maser Pool;  Location: [facility];  Service: Neurosurgery;  Laterality: N/A;  anterior cervical discectomy and fusion with allograft and plating, cervical five-six, cervical six-seven   BIOPSY N/A 04/04/2015   Procedure: BIOPSY;  Surgeon: Malissa Hippo, MD;  Location: [facility];  Service: Endoscopy;  Laterality: N/A;   BIOPSY  11/09/2019   Procedure: BIOPSY;  Surgeon: Dolores Frame, MD;  Location: [facility];  Service: Gastroenterology;;   Bone spur removed  1997   Left shoulder   CARDIAC CATHETERIZATION N/A 08/08/2015   Procedure: Right/Left Heart Cath and Coronary Angiography;  Surgeon: Tonny Bollman, MD;  Location: [facility] INVASIVE CV LAB;  Service: Cardiovascular;  Laterality: N/A;   CARPAL TUNNEL RELEASE Right    COLONOSCOPY WITH PROPOFOL N/A 11/09/2019   Procedure: COLONOSCOPY WITH PROPOFOL;  Surgeon: Dolores Frame, MD;  Location: [facility];  Service: Gastroenterology;  Laterality: N/A;  900   COLONOSCOPY WITH PROPOFOL N/A 02/27/2021   Procedure: COLONOSCOPY WITH PROPOFOL;  Surgeon: Dolores Frame, MD;  Location: [facility];  Service: Gastroenterology;  Laterality: N/A;  7:30   COLONOSCOPY WITH PROPOFOL N/A 04/05/2022   Procedure: COLONOSCOPY WITH PROPOFOL;  Surgeon: Napoleon Form, MD;  Location: [facility];  Service: Gastroenterology;  Laterality: N/A;   ENTEROSCOPY N/A 06/30/2022   Procedure: ENTEROSCOPY;  Surgeon: Dolores Frame, MD;  Location: [facility];  Service: Gastroenterology;  Laterality: N/A;  2:00pm;ASA 3   ESOPHAGEAL DILATION N/A 04/04/2015   Procedure: ESOPHAGEAL DILATION;  Surgeon: Malissa Hippo, MD;  Location: [facility];  Service: Endoscopy;  Laterality: N/A;   ESOPHAGEAL DILATION N/A 11/09/2019   Procedure: ESOPHAGEAL DILATION;  Surgeon: Dolores Frame, MD;  Location: [facility];  Service: Gastroenterology;  Laterality: N/A;   ESOPHAGOGASTRODUODENOSCOPY N/A 04/04/2015   Procedure: ESOPHAGOGASTRODUODENOSCOPY (EGD);  Surgeon: Malissa Hippo, MD;  [facility]: [facility];  Service: Endoscopy;  Laterality: N/A;  1240   ESOPHAGOGASTRODUODENOSCOPY (EGD) WITH PROPOFOL N/A 11/09/2019   Procedure: ESOPHAGOGASTRODUODENOSCOPY (EGD) WITH  PROPOFOL;  Surgeon: Dolores Frame, MD;  [facility]: [facility];  Service: Gastroenterology;  Laterality: N/A;   ESOPHAGOGASTRODUODENOSCOPY (EGD) WITH PROPOFOL N/A 04/04/2022   Procedure: ESOPHAGOGASTRODUODENOSCOPY (EGD) WITH PROPOFOL;  Surgeon: Benancio Deeds, MD;  Location: [facility] ENDOSCOPY;  Service: Gastroenterology;  Laterality: N/A;   ESOPHAGOGASTRODUODENOSCOPY (EGD) WITH PROPOFOL  06/30/2022   Procedure: ESOPHAGOGASTRODUODENOSCOPY (EGD) WITH PROPOFOL;  Surgeon: Dolores Frame, MD;  [facility];  Service: Gastroenterology;;   GIVENS CAPSULE STUDY N/A 05/11/2022   Procedure: GIVENS CAPSULE STUDY;  Surgeon: Dolores Frame, MD;  Location: [facility];  Service: Gastroenterology;  Laterality: N/A;  7:30 am   HEMORRHOID SURGERY     HOT HEMOSTASIS  06/30/2022   Procedure: HOT HEMOSTASIS (ARGON PLASMA COAGULATION/BICAP);  Surgeon: Marguerita Merles, Reuel Boom, MD;  Location: [facility];  Service: Gastroenterology;;   POLYPECTOMY  11/09/2019   Procedure: POLYPECTOMY;  Surgeon: Dolores Frame, MD;  [facility]: [facility];  Service: Gastroenterology;;   POLYPECTOMY  02/27/2021   Procedure: POLYPECTOMY;  Surgeon: Dolores Frame, MD;  Location: [facility];  Service: Gastroenterology;;   POLYPECTOMY  04/05/2022   Procedure: POLYPECTOMY;  Surgeon: Napoleon Form, MD;  Location: [facility] ENDOSCOPY;  Service: Gastroenterology;;   TONSILLECTOMY       Allergies  Allergen Reactions   Morphine And Codeine Nausea And Vomiting      Family History  Problem Relation Age of Onset   Lung cancer Mother    Diabetes Father    Hepatitis B Father    Diabetes Sister    Cushing syndrome Sister      Social History Mr. Sawada reports that he has been smoking cigarettes. He started smoking about 55 years ago. He has a 55.2 pack-year smoking history. He has quit using smokeless tobacco. Mr. Ramberg reports that he does not currently use  alcohol.   Review of Systems CONSTITUTIONAL: No weight loss, fever, chills, weakness or fatigue.  HEENT: Eyes: No visual loss, blurred vision, double vision or yellow sclerae.No hearing loss, sneezing, congestion, runny nose or  sore throat.  SKIN: No rash or itching.  CARDIOVASCULAR: per hpi RESPIRATORY: No shortness of breath, cough or sputum.  GASTROINTESTINAL: No anorexia, nausea, vomiting or diarrhea. No abdominal pain or blood.  GENITOURINARY: No burning on urination, no polyuria NEUROLOGICAL: No headache, dizziness, syncope, paralysis, ataxia, numbness or tingling in the extremities. No change in bowel or bladder control.  MUSCULOSKELETAL: No muscle, back pain, joint pain or stiffness.  LYMPHATICS: No enlarged nodes. No history of splenectomy.  PSYCHIATRIC: No history of depression or anxiety.  ENDOCRINOLOGIC: No reports of sweating, cold or heat intolerance. No polyuria or polydipsia.  Marland Kitchen   Physical Examination Vitals:   02/17/23 0802 02/17/23 0841  BP: (!) 142/70 132/78  Pulse: 78   SpO2: 92%    Filed Weights   02/17/23 0802  Weight: 183 lb 9.6 oz (83.3 kg)    Gen: resting comfortably, no acute distress HEENT: no scleral icterus, pupils equal round and reactive, no palptable cervical adenopathy,  CV:RRR, 3/6 systolic murmur rusb, no jvd Resp: Clear to auscultation bilaterally GI: abdomen is soft, non-tender, non-distended, normal bowel sounds, no hepatosplenomegaly MSK: extremities are warm, no edema.  Skin: warm, no rash Neuro:  no focal deficits Psych: appropriate affect   Diagnostic Studies Jan 2015 MPI IMPRESSION: 1.  Abnormal exercise myocardial perfusion imaging stress test   2. Inferior wall defect likely due to subdiaphragmatic attenuation, its wall motion is similar to other areas without a perfusion defect.   3. Likely small anteroseptal infract, this area is more hypokinetic than the rest of the myocardium   3.  Low left ventricular systolic  function with global hypokinesis   4. Mildly reduced functional capacity (90% of age and gender predicted)   5. Overall increased risk for major cardiac events based on low ejection fraction. There is no current myocardium at jeopardy.     02/2013 echo Study Conclusions  - Left ventricle: The cavity size was normal. There was mild   concentric hypertrophy. Systolic function was normal. The   estimated ejection fraction was in the range of 55% to   60%. Wall motion was normal; there were no regional wall   motion abnormalities. There is mildly asynchronous   contraction consistent with intraventricular conduction   delay (IVCD) or bundle Hakim [place] block. Doppler parameters   are consistent with abnormal left ventricular relaxation   (grade 1 diastolic dysfunction). - Aortic valve: Cusp separation was mildly reduced. There   was mild to moderate stenosis. - Left atrium: The atrium was moderately dilated.   07/2015 echo   Study Conclusions   - Left ventricle: The cavity size was normal. Wall thickness was   increased in a pattern of mild LVH. Systolic function was mildly   reduced. The estimated ejection fraction was in the range of 45%   to 50%. Diffuse hypokinesis. Doppler parameters are consistent   with restrictive physiology, indicative of decreased left   ventricular diastolic compliance and/or increased left atrial   pressure. Doppler parameters are consistent with high ventricular   filling pressure. - Aortic valve: Valve mobility was restricted. There was mild to   moderate stenosis. Valve area (VTI): 0.96 cm^2. Valve area   (Vmax): 0.91 cm^2. Valve area (Vmean): 0.89 cm^2. - Mitral valve: Calcified annulus. There was mild regurgitation. - Left atrium: The atrium was moderately dilated. - Right atrium: The atrium was mildly dilated. - Atrial septum: There was a patent foramen ovale.   Impressions:   - Mild global reduction in LV function; restrictive  filling with    elevated filling pressure; mild LVH; biatrial enlargement;   heavily calcified aortic valve with probable mild to moderate AS   (mean gradient 16 mmHg; calculated AVA .9 cm2 likely   overestimates severity; dimensionless index .3 not supportive of   severe AS); mild MR; patent foramen ovale.   07/2015 Cath Prox LAD to Mid LAD lesion, 50% stenosed.   1. Calcified moderate proximal-mid LAD stenosis 2. Widely patent, dominant LCx 3. Preserved cardiac output, mildly elevated right-sided intracardiac pressures   Recommend: Medical therapy for nonobstructive CAD and CHF   01/2016 AAA [place] Screen No aneurysm   08/2020 echo   IMPRESSIONS     1. Left ventricular ejection fraction, by estimation, is 60 to 65%. The  left ventricle has normal function. The left ventricle has no regional  wall motion abnormalities. There is moderate left ventricular hypertrophy.  Left ventricular diastolic  parameters are consistent with Grade I diastolic dysfunction (impaired  relaxation). Elevated left atrial pressure. The average left ventricular  global longitudinal strain is -17.6 %.   2. Right ventricular systolic function is normal. The right ventricular  size is normal.   3. The mitral valve is abnormal. Mild mitral valve regurgitation. No  evidence of mitral stenosis.   4. The aortic valve has an indeterminant number of cusps. There is  moderate calcification of the aortic valve. There is moderate thickening  of the aortic valve. Aortic valve regurgitation is mild. Mild to moderate  aortic valve stenosis. Mild to moderate   aortic stenosis is present. Aortic valve mean gradient measures 17.0  mmHg. Aortic valve peak gradient measures 26.7 mmHg. Aortic valve area, by  VTI measures 1.12 cm.    02/2021 nuclear stress  Findings are consistent with prior myocardial infarction. The study is low risk.   1 mm down sloping ST depression in the inferolateral leads was noted with exercise.  Patient did not  achieve target HR and was switched to Lexiscan.  There was no significant ST deviation during Lexiscan infusion.   Left ventricular function is abnormal. Global function is mildly reduced. Nuclear stress EF: 50 %. The left ventricular ejection fraction is mildly decreased (45-54%). End diastolic cavity size is mildly enlarged.   Abnormal myovue with motion and diaphragmatic attenuation Possible small inferior wall infarct from apex to base No ischemia EF 50% with inferior and apical hypokinesis       Assessment and Plan  1. CAD - moderate nonobstructive disease by prior cath.  - recent low risk stress test -denies any recent symptoms, continue current meds     2. Hyperlipidemia - lipids at goal, continue current meds   3. Aortic stenosis -moderate borderline severe AS - asymptomatic, normal LVEF - no evidence of vWF disease to connect his aortic stenosis to prior small bowel bleeding AVMs and erosive gastropathy.  - repeat echo pending in January   4. HTN -at goal on manual recheck, continue current meds   5. Chronic diasotlic heart failure - no symptoms, he is euvolemic today - contninue current meds    6.Afib - had not started anticoag due to recent GI bleed - EKG today shows rate controlled afib       Antoine Poche, M.D.

## 2023-02-23 ENCOUNTER — Other Ambulatory Visit (HOSPITAL_COMMUNITY)
Admission: RE | Admit: 2023-02-23 | Discharge: 2023-02-23 | Disposition: A | Discharge status: Home / Self Care | Payer: Medicare Other | Source: Ambulatory Visit | Attending: Gastroenterology | Admitting: Gastroenterology

## 2023-02-23 DIAGNOSIS — D500 Iron deficiency anemia secondary to blood loss (chronic): Secondary | ICD-10-CM | POA: Diagnosis present

## 2023-02-23 LAB — IRON AND TIBC
Iron: 46 ug/dL (ref 45–182)
Saturation Ratios: 12 % — ABNORMAL LOW (ref 17.9–39.5)
TIBC: 383 ug/dL (ref 250–450)
UIBC: 337 ug/dL

## 2023-02-23 LAB — HEMOGLOBIN AND HEMATOCRIT, BLOOD
HCT: 47.1 % (ref 39.0–52.0)
Hemoglobin: 15.5 g/dL (ref 13.0–17.0)

## 2023-02-23 LAB — FERRITIN: Ferritin: 17 ng/mL — ABNORMAL LOW (ref 24–336)

## 2023-02-24 ENCOUNTER — Encounter (INDEPENDENT_AMBULATORY_CARE_PROVIDER_SITE_OTHER): Payer: Self-pay

## 2023-02-25 ENCOUNTER — Other Ambulatory Visit (INDEPENDENT_AMBULATORY_CARE_PROVIDER_SITE_OTHER): Payer: Self-pay | Admitting: *Deleted

## 2023-02-25 DIAGNOSIS — D500 Iron deficiency anemia secondary to blood loss (chronic): Secondary | ICD-10-CM

## 2023-02-28 ENCOUNTER — Ambulatory Visit: Payer: Medicare Other | Admitting: Orthopedic Surgery

## 2023-02-28 ENCOUNTER — Encounter: Payer: Self-pay | Admitting: Orthopedic Surgery

## 2023-02-28 VITALS — BP 164/102 | HR 75 | Ht 67.0 in | Wt 183.0 lb

## 2023-02-28 DIAGNOSIS — M25511 Pain in right shoulder: Secondary | ICD-10-CM

## 2023-02-28 DIAGNOSIS — M7551 Bursitis of right shoulder: Secondary | ICD-10-CM | POA: Diagnosis not present

## 2023-02-28 DIAGNOSIS — Z981 Arthrodesis status: Secondary | ICD-10-CM

## 2023-02-28 NOTE — Progress Notes (Signed)
Follow-up appointment  Encounter Diagnoses  Name Primary?   Hx of fusion of cervical spine Yes   Acute pain of right shoulder    Bursitis of right shoulder     Chief Complaint  Patient presents with   Shoulder Pain    Right has tingling in shoulder     Right shoulder pain history of cervical fusion in 2012 by Dr. Dutch Quint  Treated with meloxicam and subacromial injection for pain in his right shoulder 24 comes in with numbness and tingling in the anterior portion of the shoulder without radiation.  Shoulder strength in abduction is 4 out of 5 and flexion is 5 out of 5  He thinks he can live with that for now  He would like his left knee evaluated which we will do in a few weeks

## 2023-03-04 ENCOUNTER — Other Ambulatory Visit: Payer: Medicare Other

## 2023-03-04 DIAGNOSIS — C61 Malignant neoplasm of prostate: Secondary | ICD-10-CM

## 2023-03-05 LAB — PSA, TOTAL AND FREE
PSA, Free Pct: 20.9 %
PSA, Free: 0.67 ng/mL
Prostate Specific Ag, Serum: 3.2 ng/mL (ref 0.0–4.0)

## 2023-03-09 ENCOUNTER — Other Ambulatory Visit (INDEPENDENT_AMBULATORY_CARE_PROVIDER_SITE_OTHER): Payer: Self-pay | Admitting: Gastroenterology

## 2023-03-09 DIAGNOSIS — Z8719 Personal history of other diseases of the digestive system: Secondary | ICD-10-CM

## 2023-03-11 ENCOUNTER — Ambulatory Visit: Payer: Medicare Other | Admitting: Urology

## 2023-03-21 ENCOUNTER — Encounter: Payer: Medicare Other | Admitting: Orthopedic Surgery

## 2023-03-28 ENCOUNTER — Ambulatory Visit: Payer: Medicare Other | Admitting: Urology

## 2023-03-28 ENCOUNTER — Encounter: Payer: Self-pay | Admitting: Urology

## 2023-03-28 ENCOUNTER — Telehealth (INDEPENDENT_AMBULATORY_CARE_PROVIDER_SITE_OTHER): Payer: Self-pay | Admitting: Gastroenterology

## 2023-03-28 VITALS — BP 137/79 | HR 79

## 2023-03-28 DIAGNOSIS — R351 Nocturia: Secondary | ICD-10-CM

## 2023-03-28 DIAGNOSIS — C61 Malignant neoplasm of prostate: Secondary | ICD-10-CM

## 2023-03-28 DIAGNOSIS — N5201 Erectile dysfunction due to arterial insufficiency: Secondary | ICD-10-CM

## 2023-03-28 NOTE — Telephone Encounter (Signed)
 Pt came by to pick up hemoccult cards per message from 02/25/23. Crystal gave cards and informed pt on how to complete cards.

## 2023-03-28 NOTE — Progress Notes (Signed)
 03/28/2023 3:25 PM   Jamin D Fager Oct 07, 1950 [id number]  Referring provider: Levora Reyes SAUNDERS, MD [phone] [address] [place],  [place] [address]  Followup prostate cancer   HPI: [name] is a 72yo here for followup for prostate cancer and nocturia. PSA stable at 3.2. no worsening LUTS. He is on jardiance  and lasix . IPSS 4 QOL 0 on no therapy. Nocturia 1-2x. No strainign to urinate. No other complaints today   PMH: Past Medical History:  Diagnosis Date   Abnormal cardiovascular stress test 03/2013   Anxiety    Arthritis    Back pain    Cancer (HCC)    Prostate cancer-no treatment per pt   CHF (congestive heart failure) (HCC)    Coronary artery disease    Multivessel with significant LAD involvement by chest CT 2014   Depression    Diastolic dysfunction    Essential hypertension    Lumbar herniated disc    L4-L5   MRSA (methicillin resistant staph aureus) culture positive    Myocardial infarction (HCC)    Neuropathy    Shortness of breath dyspnea    Sleep apnea    No cpap   Type 2 diabetes mellitus [facility])     Surgical History: Past Surgical History:  Procedure Laterality Date   ANTERIOR CERVICAL DECOMP/DISCECTOMY FUSION  01/18/2011   Procedure: ANTERIOR CERVICAL DECOMPRESSION/DISCECTOMY FUSION 2 LEVELS;  Surgeon: Victory LABOR Pool;  Location: [facility];  Service: Neurosurgery;  Laterality: N/A;  anterior cervical discectomy and fusion with allograft and plating, cervical five-six, cervical six-seven   BIOPSY N/A 04/04/2015   Procedure: BIOPSY;  Surgeon: Claudis RAYMOND Rivet, MD;  Location: [facility];  Service: Endoscopy;  Laterality: N/A;   BIOPSY  11/09/2019   Procedure: BIOPSY;  Surgeon: Eartha Angelia Sieving, MD;  Location: [facility];  Service: Gastroenterology;;   Bone spur removed  1997   Left shoulder   CARDIAC CATHETERIZATION N/A 08/08/2015   Procedure: Right/Left Heart Cath and Coronary Angiography;  Surgeon: Ozell Fell, MD;  Location: [facility] INVASIVE CV  LAB;  Service: Cardiovascular;  Laterality: N/A;   CARPAL TUNNEL RELEASE Right    COLONOSCOPY WITH PROPOFOL  N/A 11/09/2019   Procedure: COLONOSCOPY WITH PROPOFOL ;  Surgeon: Eartha Angelia Sieving, MD;  [facility]: [facility];  Service: Gastroenterology;  Laterality: N/A;  900   COLONOSCOPY WITH PROPOFOL  N/A 02/27/2021   Procedure: COLONOSCOPY WITH PROPOFOL ;  Surgeon: Eartha Angelia Sieving, MD;  [facility];  Service: Gastroenterology;  Laterality: N/A;  7:30   COLONOSCOPY WITH PROPOFOL  N/A 04/05/2022   Procedure: COLONOSCOPY WITH PROPOFOL ;  Surgeon: Shila Gustav GAILS, MD;  Location: [facility] ENDOSCOPY;  Service: Gastroenterology;  Laterality: N/A;   ENTEROSCOPY N/A 06/30/2022   Procedure: ENTEROSCOPY;  Surgeon: Eartha Angelia Sieving, MD;  [facility];  Service: Gastroenterology;  Laterality: N/A;  2:00pm;ASA 3   ESOPHAGEAL DILATION N/A 04/04/2015   Procedure: ESOPHAGEAL DILATION;  Surgeon: Claudis RAYMOND Rivet, MD;  [facility];  Service: Endoscopy;  Laterality: N/A;   ESOPHAGEAL DILATION N/A 11/09/2019   Procedure: ESOPHAGEAL DILATION;  Surgeon: Eartha Angelia Sieving, MD;  [facility];  Service: Gastroenterology;  Laterality: N/A;   ESOPHAGOGASTRODUODENOSCOPY N/A 04/04/2015   Procedure: ESOPHAGOGASTRODUODENOSCOPY (EGD);  Surgeon: Claudis RAYMOND Rivet, MD;  [facility];  Service: Endoscopy;  Laterality: N/A;  1240   ESOPHAGOGASTRODUODENOSCOPY (EGD) WITH PROPOFOL  N/A 11/09/2019   Procedure: ESOPHAGOGASTRODUODENOSCOPY (EGD) WITH PROPOFOL ;  Surgeon: Eartha Angelia Sieving, MD;  [facility]  [facility];  Service: Gastroenterology;  Laterality: N/A;   ESOPHAGOGASTRODUODENOSCOPY (EGD) WITH PROPOFOL  N/A 04/04/2022   Procedure: ESOPHAGOGASTRODUODENOSCOPY (EGD) WITH PROPOFOL ;  Surgeon: Leigh Elspeth SQUIBB, MD;  [facility]: [facility] ENDOSCOPY;  Service: Gastroenterology;  Laterality: N/A;   ESOPHAGOGASTRODUODENOSCOPY (EGD) WITH PROPOFOL   06/30/2022    Procedure: ESOPHAGOGASTRODUODENOSCOPY (EGD) WITH PROPOFOL ;  Surgeon: Eartha Angelia Sieving, MD;  [facility];  Service: Gastroenterology;;   GIVENS CAPSULE STUDY N/A 05/11/2022   Procedure: GIVENS CAPSULE STUDY;  Surgeon: Eartha Angelia Sieving, MD;  [facility];  Service: Gastroenterology;  Laterality: N/A;  7:30 am   HEMORRHOID SURGERY     HOT HEMOSTASIS  06/30/2022   Procedure: HOT HEMOSTASIS (ARGON PLASMA COAGULATION/BICAP);  Surgeon: Eartha Angelia, Sieving, MD;  [facility];  Service: Gastroenterology;;   POLYPECTOMY  11/09/2019   Procedure: POLYPECTOMY;  Surgeon: Eartha Angelia Sieving, MD;  [facility];  Service: Gastroenterology;;   POLYPECTOMY  02/27/2021   Procedure: POLYPECTOMY;  Surgeon: Eartha Angelia Sieving, MD;  [facility];  Service: Gastroenterology;;   POLYPECTOMY  04/05/2022   Procedure: POLYPECTOMY;  Surgeon: Shila Gustav GAILS, MD;  Location: [facility] ENDOSCOPY;  Service: Gastroenterology;;   TONSILLECTOMY      Home Medications:  Allergies as of 03/28/2023       Reactions   Morphine And Codeine Nausea And Vomiting        Medication List        Accurate as of March 28, 2023  3:25 PM. If you have any questions, ask your nurse or doctor.          Accu-Chek Aviva Plus test strip Generic drug: glucose blood USE AS INSTRUCTED   aspirin  EC 81 MG tablet Take 1 tablet (81 mg total) by mouth daily.   atorvastatin  80 MG tablet Commonly known as: LIPITOR  Take 1 tablet (80 mg total) by mouth daily.   CAL-MAG-ZINC PO Take 1 tablet by mouth daily.   ferrous sulfate  325 (65 FE) MG tablet Take 1 tablet (325 mg total) by mouth daily with breakfast.   folic acid  1 MG tablet Commonly known as: FOLVITE  Take 1 tablet (1 mg total) by mouth daily.   furosemide  40 MG tablet Commonly known as: LASIX  Take 1 tablet (40 mg total) by mouth as needed for edema (swelling).   gabapentin  300 MG  capsule Commonly known as: NEURONTIN  TAKE 1 TO 2 CAPSULES BY MOUTH  TWICE DAILY   Jardiance  10 MG Tabs tablet Generic drug: empagliflozin  TAKE 1 TABLET BY MOUTH DAILY  BEFORE BREAKFAST   lisinopril  20 MG tablet Commonly known as: ZESTRIL  Take 1 tablet (20 mg total) by mouth daily.   magnesium  oxide 400 MG tablet Commonly known as: MAG-OX Take 400 mg by mouth at bedtime.   meloxicam  7.5 MG tablet Commonly known as: Mobic  Take 1 tablet (7.5 mg total) by mouth daily.   metFORMIN  500 MG tablet Commonly known as: GLUCOPHAGE  TAKE 2 TABLETS BY MOUTH TWICE  DAILY   metoprolol  succinate 50 MG 24 hr tablet Commonly known as: TOPROL -XL Take 1 tablet (50 mg total) by mouth daily.   multivitamin with minerals Tabs tablet Take 1 tablet by mouth daily.   nitroGLYCERIN  0.4 MG SL tablet Commonly known as: NITROSTAT  Place 1 tablet (0.4 mg total) under the tongue every 5 (five) minutes x 3 doses as needed for chest pain (if no relief after 2nd dose, proceed to ED for an evaluation).   pantoprazole  40 MG tablet Commonly known  as: PROTONIX  TAKE 1 TABLET BY MOUTH DAILY        Allergies:  Allergies  Allergen Reactions   Morphine And Codeine Nausea And Vomiting    Family History: Family History  Problem Relation Age of Onset   Lung cancer Mother    Diabetes Father    Hepatitis B Father    Diabetes Sister    Cushing syndrome Sister     Social History:  reports that he has been smoking cigarettes. He started smoking about 55 years ago. He has a 55.3 pack-year smoking history. He has quit using smokeless tobacco. He reports that he does not currently use alcohol. He reports current drug use. Frequency: 5.00 times per week. Drug: Marijuana.  ROS: All other review of systems were reviewed and are negative except what is noted above in HPI  Physical Exam: BP 137/79   Pulse 79   Constitutional:  Alert and oriented, No acute distress. HEENT: [place] AT, moist mucus membranes.  Trachea  midline, no masses. Cardiovascular: No clubbing, cyanosis, or edema. Respiratory: Normal respiratory effort, no increased work of breathing. GI: Abdomen is soft, nontender, nondistended, no abdominal masses GU: No CVA tenderness.  Lymph: No cervical or inguinal lymphadenopathy. Skin: No rashes, bruises or suspicious lesions. Neurologic: Grossly intact, no focal deficits, moving all 4 extremities. Psychiatric: Normal mood and affect.  Laboratory Data: Lab Results  Component Value Date   WBC 7.0 08/19/2022   HGB 15.5 02/23/2023   HCT 47.1 02/23/2023   MCV 82.2 08/19/2022   PLT 239.0 08/19/2022    Lab Results  Component Value Date   CREATININE 0.68 08/19/2022    Lab Results  Component Value Date   PSA 3.85 12/01/2020   PSA 2.1 02/26/2019    Lab Results  Component Value Date   TESTOSTERONE  241.06 (L) 12/01/2020    Lab Results  Component Value Date   HGBA1C 8.4 (H) 08/19/2022    Urinalysis    Component Value Date/Time   COLORURINE STRAW (A) 01/16/2018 0938   APPEARANCEUR Clear 09/07/2021 1141   LABSPEC 1.008 01/16/2018 0938   PHURINE 7.0 01/16/2018 0938   GLUCOSEU Negative 09/07/2021 1141   HGBUR NEGATIVE 01/16/2018 0938   BILIRUBINUR Negative 09/07/2021 1141   KETONESUR NEGATIVE 01/16/2018 0938   PROTEINUR Negative 09/07/2021 1141   PROTEINUR NEGATIVE 01/16/2018 0938   UROBILINOGEN 0.2 08/23/2011 0807   NITRITE Negative 09/07/2021 1141   NITRITE NEGATIVE 01/16/2018 0938   LEUKOCYTESUR Negative 09/07/2021 1141    Lab Results  Component Value Date   LABMICR Comment 09/07/2021    Pertinent Imaging:  No results found for this or any previous visit.  No results found for this or any previous visit.  No results found for this or any previous visit.  No results found for this or any previous visit.  No results found for this or any previous visit.  No results found for this or any previous visit.  No results found for this or any previous  visit.  No results found for this or any previous visit.   Assessment & Plan:    1. Prostate cancer (HCC) (Primary) -followup 6 months with PSA  2. Nocturia Decrease fluid intake within 2 hours of going to bed     No follow-ups on file.  Belvie Clara, MD  [facility] [place]

## 2023-03-29 LAB — URINALYSIS, ROUTINE W REFLEX MICROSCOPIC
Bilirubin, UA: NEGATIVE
Glucose, UA: NEGATIVE
Ketones, UA: NEGATIVE
Nitrite, UA: NEGATIVE
Protein,UA: NEGATIVE
Specific Gravity, UA: 1.02 (ref 1.005–1.030)
Urobilinogen, Ur: 2 mg/dL — ABNORMAL HIGH (ref 0.2–1.0)
pH, UA: 6.5 (ref 5.0–7.5)

## 2023-03-29 LAB — MICROSCOPIC EXAMINATION: Bacteria, UA: NONE SEEN

## 2023-03-30 ENCOUNTER — Other Ambulatory Visit (HOSPITAL_COMMUNITY): Payer: Self-pay

## 2023-03-31 ENCOUNTER — Encounter: Payer: Self-pay | Admitting: Urology

## 2023-03-31 ENCOUNTER — Other Ambulatory Visit: Payer: Self-pay | Admitting: *Deleted

## 2023-03-31 DIAGNOSIS — Z122 Encounter for screening for malignant neoplasm of respiratory organs: Secondary | ICD-10-CM

## 2023-03-31 DIAGNOSIS — Z87891 Personal history of nicotine dependence: Secondary | ICD-10-CM

## 2023-03-31 DIAGNOSIS — F17210 Nicotine dependence, cigarettes, uncomplicated: Secondary | ICD-10-CM

## 2023-03-31 NOTE — Patient Instructions (Signed)

## 2023-04-04 ENCOUNTER — Other Ambulatory Visit (INDEPENDENT_AMBULATORY_CARE_PROVIDER_SITE_OTHER): Payer: Self-pay

## 2023-04-04 DIAGNOSIS — R195 Other fecal abnormalities: Secondary | ICD-10-CM

## 2023-04-04 DIAGNOSIS — D500 Iron deficiency anemia secondary to blood loss (chronic): Secondary | ICD-10-CM

## 2023-04-04 LAB — POC HEMOCCULT BLD/STL (HOME/3-CARD/SCREEN)
Card #2 Fecal Occult Blod, POC: NEGATIVE
Card #3 Fecal Occult Blood, POC: NEGATIVE
Fecal Occult Blood, POC: NEGATIVE

## 2023-04-07 ENCOUNTER — Ambulatory Visit: Payer: Medicare Other | Attending: Cardiology

## 2023-04-07 DIAGNOSIS — I350 Nonrheumatic aortic (valve) stenosis: Secondary | ICD-10-CM | POA: Diagnosis not present

## 2023-04-08 ENCOUNTER — Encounter: Payer: Self-pay | Admitting: Oncology

## 2023-04-08 ENCOUNTER — Inpatient Hospital Stay: Payer: Medicare Other | Attending: Oncology | Admitting: Oncology

## 2023-04-08 ENCOUNTER — Inpatient Hospital Stay: Payer: Medicare Other

## 2023-04-08 VITALS — BP 128/83 | HR 78 | Temp 98.0°F | Resp 16 | Ht 67.0 in | Wt 190.6 lb

## 2023-04-08 DIAGNOSIS — D509 Iron deficiency anemia, unspecified: Secondary | ICD-10-CM | POA: Diagnosis present

## 2023-04-08 DIAGNOSIS — F17210 Nicotine dependence, cigarettes, uncomplicated: Secondary | ICD-10-CM | POA: Insufficient documentation

## 2023-04-08 DIAGNOSIS — D500 Iron deficiency anemia secondary to blood loss (chronic): Secondary | ICD-10-CM

## 2023-04-08 DIAGNOSIS — E611 Iron deficiency: Secondary | ICD-10-CM | POA: Insufficient documentation

## 2023-04-08 LAB — CBC WITH DIFFERENTIAL/PLATELET
Abs Immature Granulocytes: 0.04 10*3/uL (ref 0.00–0.07)
Basophils Absolute: 0.1 10*3/uL (ref 0.0–0.1)
Basophils Relative: 1 %
Eosinophils Absolute: 0.2 10*3/uL (ref 0.0–0.5)
Eosinophils Relative: 3 %
HCT: 46.3 % (ref 39.0–52.0)
Hemoglobin: 15.4 g/dL (ref 13.0–17.0)
Immature Granulocytes: 1 %
Lymphocytes Relative: 28 %
Lymphs Abs: 2.3 10*3/uL (ref 0.7–4.0)
MCH: 30.6 pg (ref 26.0–34.0)
MCHC: 33.3 g/dL (ref 30.0–36.0)
MCV: 91.9 fL (ref 80.0–100.0)
Monocytes Absolute: 0.6 10*3/uL (ref 0.1–1.0)
Monocytes Relative: 8 %
Neutro Abs: 5.2 10*3/uL (ref 1.7–7.7)
Neutrophils Relative %: 59 %
Platelets: 149 10*3/uL — ABNORMAL LOW (ref 150–400)
RBC: 5.04 MIL/uL (ref 4.22–5.81)
RDW: 14 % (ref 11.5–15.5)
WBC: 8.5 10*3/uL (ref 4.0–10.5)
nRBC: 0 % (ref 0.0–0.2)

## 2023-04-08 LAB — ECHOCARDIOGRAM COMPLETE
AR max vel: 0.91 cm2
AV Area VTI: 0.8 cm2
AV Area mean vel: 0.9 cm2
AV Mean grad: 22 mm[Hg]
AV Peak grad: 37.3 mm[Hg]
AV Vena cont: 0.6 cm
Ao pk vel: 3.05 m/s
Calc EF: 60.5 %
P 1/2 time: 373 ms
S' Lateral: 3.4 cm
Single Plane A2C EF: 63 %
Single Plane A4C EF: 55.2 %

## 2023-04-08 LAB — COMPREHENSIVE METABOLIC PANEL
ALT: 35 U/L (ref 0–44)
AST: 31 U/L (ref 15–41)
Albumin: 3.5 g/dL (ref 3.5–5.0)
Alkaline Phosphatase: 86 U/L (ref 38–126)
Anion gap: 6 (ref 5–15)
BUN: 12 mg/dL (ref 8–23)
CO2: 30 mmol/L (ref 22–32)
Calcium: 9 mg/dL (ref 8.9–10.3)
Chloride: 102 mmol/L (ref 98–111)
Creatinine, Ser: 0.63 mg/dL (ref 0.61–1.24)
GFR, Estimated: >60 mL/min (ref >60)
Glucose, Bld: 166 mg/dL — ABNORMAL HIGH (ref 70–99)
Potassium: 4.2 mmol/L (ref 3.5–5.1)
Sodium: 138 mmol/L (ref 135–145)
Total Bilirubin: 1.3 mg/dL — ABNORMAL HIGH (ref 0.0–1.2)
Total Protein: 6.3 g/dL — ABNORMAL LOW (ref 6.5–8.1)

## 2023-04-08 LAB — IRON AND TIBC
Iron: 63 ug/dL (ref 45–182)
Saturation Ratios: 20 % (ref 17.9–39.5)
TIBC: 311 ug/dL (ref 250–450)
UIBC: 248 ug/dL

## 2023-04-08 LAB — FERRITIN: Ferritin: 77 ng/mL (ref 24–336)

## 2023-04-08 LAB — FOLATE: Folate: 24.3 ng/mL (ref >5.9)

## 2023-04-08 LAB — VITAMIN B12: Vitamin B-12: 397 pg/mL (ref 180–914)

## 2023-04-08 NOTE — Patient Instructions (Signed)
VISIT SUMMARY:  You came in today to discuss your ongoing anemia and fatigue. We reviewed your history of gastrointestinal bleeding and your current iron supplementation. We also discussed your sleep habits and tobacco use.  YOUR PLAN:  -IRON DEFICIENCY ANEMIA: Iron deficiency anemia occurs when your body doesn't have enough iron to produce adequate levels of hemoglobin, which is necessary for carrying oxygen in your blood. We will draw blood for updated labs and schedule you for IV iron therapy to quickly replenish your iron stores. Continue taking your oral iron supplements every other day. We will reassess your condition in 6 weeks.  -SLEEP DEPRIVATION: Sleep deprivation happens when you don't get enough sleep, which can lead to chronic fatigue. We recommend that you try to increase your sleep duration to improve your overall health.  -TOBACCO USE: Smoking can have many negative effects on your health. We strongly encourage you to quit smoking to improve your overall well-being.  INSTRUCTIONS:  Please get your blood drawn for updated labs and schedule your IV iron therapy as soon as possible. Continue taking your oral iron supplements every other day. We will see you again in 6 weeks to check on your progress.

## 2023-04-08 NOTE — Assessment & Plan Note (Signed)
Iron deficiency without anemia at this time.  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 -Labs from today showed resolution of iron deficiency. -Continue oral iron supplementation every other day -Return to clinic in 3 months with labs to reassess

## 2023-04-08 NOTE — Progress Notes (Signed)
[facility] HEMATOLOGY NEW VISIT  Shade Flood, MD  REASON FOR REFERRAL: Iron deficiency anemia  HISTORY OF PRESENT ILLNESS: Jonathan Gilmore 72 y.o. male referred for iron deficiency anemia.  Patient had a history of multiple duodenal AVMs and has been diagnosed of anemia before.  Patient had an endoscopy done for 2024 and had APC done for his nonbleeding duodenal AVMs.  Since then patient had improved hemoglobin.  He has been taking iron oral supplementation every day.  Patient reports persistent fatigue but associates with this sleep deprivation.  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  He has no other complaints.  He denies any recent blood in stools or black-colored stools.  He smokes a cigarette occasionally and does not drink alcohol.   I have reviewed the past medical history, past surgical history, social history and family history with the patient   ALLERGIES:  is allergic to morphine and codeine.  MEDICATIONS:  Current Outpatient Medications  Medication Sig Dispense Refill   ACCU-CHEK AVIVA PLUS test strip USE AS INSTRUCTED 100 strip 3   aspirin EC 81 MG tablet Take 1 tablet (81 mg total) by mouth daily. 30 tablet 11   atorvastatin (LIPITOR) 80 MG tablet Take 1 tablet (80 mg total) by mouth daily. 90 tablet 3   Calcium-Magnesium-Zinc (CAL-MAG-ZINC PO) Take 1 tablet by mouth daily.     ferrous sulfate 325 (65 FE) MG tablet Take 1 tablet (325 mg total) by mouth daily with breakfast. 90 tablet 3   folic acid (FOLVITE) 1 MG tablet Take 1 tablet (1 mg total) by mouth daily. 30 tablet 0   furosemide (LASIX) 40 MG tablet Take 1 tablet (40 mg total) by mouth as needed for edema (swelling).     gabapentin (NEURONTIN) 300 MG capsule TAKE 1 TO 2 CAPSULES BY MOUTH  TWICE DAILY 360 capsule 3   JARDIANCE 10 MG TABS tablet TAKE 1 TABLET BY MOUTH DAILY  BEFORE BREAKFAST 90 tablet 3   lisinopril (ZESTRIL) 20 MG tablet Take  1 tablet (20 mg total) by mouth daily. 90 tablet 3   magnesium oxide (MAG-OX) 400 MG tablet Take 400 mg by mouth at bedtime.     meloxicam (MOBIC) 7.5 MG tablet Take 1 tablet (7.5 mg total) by mouth daily. 30 tablet 5   metFORMIN (GLUCOPHAGE) 500 MG tablet TAKE 2 TABLETS BY MOUTH TWICE  DAILY 360 tablet 3   metoprolol succinate (TOPROL-XL) 50 MG 24 hr tablet Take 1 tablet (50 mg total) by mouth daily. 90 tablet 3   Multiple Vitamin (MULTIVITAMIN WITH MINERALS) TABS Take 1 tablet by mouth daily.     nitroGLYCERIN (NITROSTAT) 0.4 MG SL tablet Place 1 tablet (0.4 mg total) under the tongue every 5 (five) minutes x 3 doses as needed for chest pain (if no relief after 2nd dose, proceed to ED for an evaluation). 25 tablet 3   pantoprazole (PROTONIX) 40 MG tablet TAKE 1 TABLET BY MOUTH DAILY 90 tablet 3   No current facility-administered medications for this visit.     REVIEW OF SYSTEMS:   Constitutional: Denies fevers, chills or night sweats Eyes: Denies blurriness of vision Ears, nose, mouth, throat, and face: Denies mucositis or sore throat Respiratory: Denies cough, dyspnea or wheezes Cardiovascular: Denies palpitation, chest discomfort or lower extremity swelling Gastrointestinal:  Denies nausea, heartburn or change in bowel habits Skin: Denies abnormal skin rashes Lymphatics: Denies new lymphadenopathy  or easy bruising Neurological:Denies numbness, tingling or new weaknesses Behavioral/Psych: Mood is stable, no new changes  All other systems were reviewed with the patient and are negative.  PHYSICAL EXAMINATION:   Vitals:   04/08/23 1414  BP: 128/83  Pulse: 78  Resp: 16  Temp: 98 F (36.7 C)  SpO2: 97%    GENERAL:alert, no distress and comfortable LUNGS: clear to auscultation and percussion with normal breathing effort HEART: regular rate & rhythm and no murmurs and no lower extremity edema ABDOMEN:abdomen soft, non-tender and normal bowel sounds Musculoskeletal:no cyanosis  of digits and no clubbing  NEURO: alert & oriented x 3 with fluent speech  LABORATORY DATA:  I have reviewed the data as listed  Lab Results  Component Value Date   WBC 8.5 04/08/2023   NEUTROABS 5.2 04/08/2023   HGB 15.4 04/08/2023   HCT 46.3 04/08/2023   MCV 91.9 04/08/2023   PLT 149 (L) 04/08/2023      Component Value Date/Time   NA 138 04/08/2023 1435   K 4.2 04/08/2023 1435   CL 102 04/08/2023 1435   CO2 30 04/08/2023 1435   GLUCOSE 166 (H) 04/08/2023 1435   BUN 12 04/08/2023 1435   CREATININE 0.63 04/08/2023 1435   CREATININE 0.70 08/13/2020 0859   CALCIUM 9.0 04/08/2023 1435   PROT 6.3 (L) 04/08/2023 1435   ALBUMIN 3.5 04/08/2023 1435   AST 31 04/08/2023 1435   ALT 35 04/08/2023 1435   ALKPHOS 86 04/08/2023 1435   BILITOT 1.3 (H) 04/08/2023 1435   GFRNONAA >60 04/08/2023 1435   GFRNONAA 97 05/06/2020 1325   GFRAA 113 05/06/2020 1325       Chemistry      Component Value Date/Time   NA 138 04/08/2023 1435   K 4.2 04/08/2023 1435   CL 102 04/08/2023 1435   CO2 30 04/08/2023 1435   BUN 12 04/08/2023 1435   CREATININE 0.63 04/08/2023 1435   CREATININE 0.70 08/13/2020 0859      Component Value Date/Time   CALCIUM 9.0 04/08/2023 1435   ALKPHOS 86 04/08/2023 1435   AST 31 04/08/2023 1435   ALT 35 04/08/2023 1435   BILITOT 1.3 (H) 04/08/2023 1435      Latest Reference Range & Units 02/26/19 09:53 04/04/22 06:42 08/19/22 12:07 02/23/23 08:04 04/08/23 14:35  Iron 45 - 182 ug/dL  31 (L) 53 46 63  UIBC ug/dL  161  096 045  TIBC 409 - 450 ug/dL  811 914 782 956  %SAT 20 - 48 % (calc)   15 (L)    Saturation Ratios 17.9 - 39.5 %  8 (L)  12 (L) 20  Ferritin 24 - 336 ng/mL 117 9 (L) 15 (L) 17 (L) 77  (L): Data is abnormally low  ASSESSMENT & PLAN:  Patient is a 73 year old male with past medical history of duodenal AVMs presenting for iron deficiency   Iron deficiency Iron deficiency without anemia at this time.  Likely secondary to bleeding duodenal AVMs  which were treated with APC. -Labs from today showed resolution of iron deficiency. -Continue oral iron supplementation every other day -Return to clinic in 3 months with labs to reassess   Orders Placed This Encounter  Procedures   Ferritin    Standing Status:   Future    Number of Occurrences:   1    Expected Date:   04/08/2023    Expiration Date:   04/07/2024   Folate    Standing Status:   Future  Number of Occurrences:   1    Expected Date:   04/08/2023    Expiration Date:   04/07/2024   Vitamin B12    Standing Status:   Future    Number of Occurrences:   1    Expected Date:   04/08/2023    Expiration Date:   04/07/2024   CBC with Differential/Platelet    Standing Status:   Future    Number of Occurrences:   1    Expected Date:   04/08/2023    Expiration Date:   04/07/2024   Comprehensive metabolic panel    Standing Status:   Future    Number of Occurrences:   1    Expected Date:   04/08/2023    Expiration Date:   04/07/2024   Iron and TIBC    Standing Status:   Future    Number of Occurrences:   1    Expected Date:   04/08/2023    Expiration Date:   04/07/2024    The total time spent in the appointment was 30 minutes encounter with patients including review of chart and various tests results, discussions about plan of care and coordination of care plan   All questions were answered. The patient knows to call the clinic with any problems, questions or concerns. No barriers to learning was detected.   Cindie Crumbly, MD 1/24/20253:51 PM

## 2023-04-12 ENCOUNTER — Encounter (INDEPENDENT_AMBULATORY_CARE_PROVIDER_SITE_OTHER): Payer: Self-pay

## 2023-04-13 ENCOUNTER — Ambulatory Visit: Payer: Medicare Other

## 2023-04-14 ENCOUNTER — Ambulatory Visit: Payer: Medicare Other | Admitting: Acute Care

## 2023-04-14 NOTE — Progress Notes (Unsigned)
Attempted to reach pt x 3- left message on machine to call office

## 2023-04-20 ENCOUNTER — Ambulatory Visit: Payer: Medicare Other

## 2023-04-21 ENCOUNTER — Other Ambulatory Visit: Payer: Self-pay | Admitting: Student

## 2023-04-21 ENCOUNTER — Ambulatory Visit (HOSPITAL_COMMUNITY): Payer: Medicare Other

## 2023-04-26 ENCOUNTER — Ambulatory Visit
Admission: RE | Admit: 2023-04-26 | Discharge: 2023-04-26 | Disposition: A | Discharge status: Home / Self Care | Payer: Medicare Other | Source: Ambulatory Visit | Attending: Nurse Practitioner | Admitting: Nurse Practitioner

## 2023-04-26 VITALS — BP 163/88 | HR 80 | Temp 98.8°F | Resp 18

## 2023-04-26 DIAGNOSIS — J22 Unspecified acute lower respiratory infection: Secondary | ICD-10-CM

## 2023-04-26 DIAGNOSIS — Z87891 Personal history of nicotine dependence: Secondary | ICD-10-CM | POA: Diagnosis not present

## 2023-04-26 DIAGNOSIS — R059 Cough, unspecified: Secondary | ICD-10-CM

## 2023-04-26 MED ORDER — AMOXICILLIN-POT CLAVULANATE 875-125 MG PO TABS: 1.0000 | ORAL_TABLET | Freq: Two times a day (BID) | ORAL | 0 refills | Status: DC

## 2023-04-26 MED ORDER — PROMETHAZINE-DM 6.25-15 MG/5ML PO SYRP: 5.0000 mL | ORAL_SOLUTION | Freq: Four times a day (QID) | ORAL | 0 refills | Status: DC | PRN

## 2023-04-26 MED ORDER — ALBUTEROL SULFATE HFA 108 (90 BASE) MCG/ACT IN AERS: 2.0000 | INHALATION_SPRAY | Freq: Four times a day (QID) | RESPIRATORY_TRACT | 0 refills | Status: DC | PRN

## 2023-04-26 MED ORDER — PREDNISONE 20 MG PO TABS
40.0000 mg | ORAL_TABLET | Freq: Every day | ORAL | 0 refills | Status: AC
Start: 2023-04-26 — End: 2023-05-01

## 2023-04-26 NOTE — Telephone Encounter (Signed)
Jonathan Gilmore please see 02/24/2023 conversation where we told the patient to increase to bid. Is it ok to send in for bid dosing to patient's mail order?

## 2023-04-26 NOTE — ED Triage Notes (Signed)
Productive cough x 8 days

## 2023-04-26 NOTE — Discharge Instructions (Addendum)
Take medication as prescribed. May take over-the-counter Tylenol as needed for pain, fever, or general discomfort. Increase fluids and allow for plenty of rest. Recommend using a humidifier at nighttime and sleeping elevated on pillows while symptoms persist. Please consider smoking sensation.  Recommend discussing ways to help you stop smoking with your PCP at your next appointment. Go to the emergency department if you develop shortness of breath, difficulty breathing or other concerns. Please call [facility] to reschedule appointment as soon as possible. Follow-up with PCP as scheduled. Follow-up as needed.

## 2023-04-26 NOTE — ED Provider Notes (Signed)
[facility]    CSN: [id number] Arrival date & time: 04/26/23  1506      History   Chief Complaint Chief Complaint  Patient presents with   Cough    HPI Jonathan Gilmore is a 73 y.o. male.   The history is provided by the patient.   Patient presents for complaints of cough and fatigue that been present for the past several days.  Patient denies fever, chills, headache, ear pain, nasal congestion, runny nose, difficulty breathing, chest pain, abdominal pain, nausea, vomiting, diarrhea, or rash.  Patient states that he has been wheezing at night.  Patient is a current smoker, patient with 55.4 total pack year smoker.  States that he has not been smoking as much since his cough is worsening.  Per review of patient's chart, patient missed appointment with pulmonology last month.  Patient states he was unaware of the appointment.  Patient states he is not taking any medication for his symptoms.  Past Medical History:  Diagnosis Date   Abnormal cardiovascular stress test 03/2013   Anxiety    Arthritis    Back pain    Cancer (HCC)    Prostate cancer-no treatment per pt   CHF (congestive heart failure) (HCC)    Coronary artery disease    Multivessel with significant LAD involvement by chest CT 2014   Depression    Diastolic dysfunction    Essential hypertension    Lumbar herniated disc    L4-L5   MRSA (methicillin resistant staph aureus) culture positive    Myocardial infarction (HCC)    Neuropathy    Shortness of breath dyspnea    Sleep apnea    No cpap   Type 2 diabetes mellitus (HCC)     Patient Active Problem List   Diagnosis Date Noted   Iron deficiency 04/08/2023   Belching 02/08/2023   Hx of fusion of cervical spine 11/22/2022   AVM (arteriovenous malformation) of small bowel, acquired 06/30/2022   Iron deficiency anemia due to chronic blood loss 04/22/2022   History of esophagitis 04/22/2022   Heme positive stool 04/04/2022   Iron deficiency anemia  04/04/2022   Acute blood loss anemia 04/02/2022   Prostate cancer (HCC) 06/08/2021   History of colonic polyps 02/09/2021   Intestinal metaplasia of antrum of stomach without dysplasia 02/09/2021   Insomnia 03/12/2019   Pure hypercholesterolemia 11/29/2018   Hypogonadism, male 11/23/2018   Essential hypertension, benign 11/13/2018   High risk medication use 10/28/2018   Carotid stenosis, asymptomatic, bilateral 10/28/2018   Tobacco use 10/28/2018   Polyp of colon 10/28/2018   Lumbar disc herniation with radiculopathy 12/21/2017   Spinal stenosis of lumbar region without neurogenic claudication 12/21/2017   CAD -50% LAD  08/09/2015   Moderate aortic stenosis 08/09/2015   Acute pulmonary edema (HCC) 08/07/2015   Acute diastolic CHF (congestive heart failure) (HCC) 08/07/2015   Hypersomnia with sleep apnea 09/20/2013   Syncope 09/19/2013   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   OSA (obstructive sleep apnea) -non compliant 09/19/2013   Narcolepsy and cataplexy 09/19/2013   Facial abscess 04/28/2013   Facial cellulitis 04/28/2013   Mixed hyperlipidemia 04/28/2013   Tobacco dependence 04/28/2013   Sepsis (HCC) 04/28/2013   Gout 04/28/2013   Diastolic CHF (HCC) 04/28/2013   Pleuritic pain 02/24/2013   Chest pain 02/24/2013   Murmur 02/24/2013   Pericarditis 02/24/2013   DM type 2 causing vascular disease (HCC) 02/24/2013   Essential hypertension 02/24/2013   Cervical spondylosis with myelopathy  01/19/2011    Past Surgical History:  Procedure Laterality Date   ANTERIOR CERVICAL DECOMP/DISCECTOMY FUSION  01/18/2011   Procedure: ANTERIOR CERVICAL DECOMPRESSION/DISCECTOMY FUSION 2 LEVELS;  Surgeon: Kathaleen Maser Pool;  Location: [facility];  Service: Neurosurgery;  Laterality: N/A;  anterior cervical discectomy and fusion with allograft and plating, cervical five-six, cervical six-seven   BIOPSY N/A 04/04/2015   Procedure: BIOPSY;  Surgeon: Malissa Hippo, MD;  Location: [facility];  Service: Endoscopy;  Laterality: N/A;   BIOPSY  11/09/2019   Procedure: BIOPSY;  Surgeon: Dolores Frame, MD;  Location: [facility];  Service: Gastroenterology;;   Bone spur removed  1997   Left shoulder   CARDIAC CATHETERIZATION N/A 08/08/2015   Procedure: Right/Left Heart Cath and Coronary Angiography;  Surgeon: Tonny Bollman, MD;  Location: [facility] CV [facility];  Service: Cardiovascular;  Laterality: N/A;   CARPAL TUNNEL RELEASE Right    COLONOSCOPY WITH PROPOFOL N/A 11/09/2019   Procedure: COLONOSCOPY WITH PROPOFOL;  Surgeon: Dolores Frame, MD;  Location: [facility];  Service: Gastroenterology;  Laterality: N/A;  900   COLONOSCOPY WITH PROPOFOL N/A 02/27/2021   Procedure: COLONOSCOPY WITH PROPOFOL;  Surgeon: Dolores Frame, MD;  Location: [facility];  Service: Gastroenterology;  Laterality: N/A;  7:30   COLONOSCOPY WITH PROPOFOL N/A 04/05/2022   Procedure: COLONOSCOPY WITH PROPOFOL;  Surgeon: Napoleon Form, MD;  Location: [facility];  Service: Gastroenterology;  Laterality: N/A;   ENTEROSCOPY N/A 06/30/2022   Procedure: ENTEROSCOPY;  Surgeon: Dolores Frame, MD;  Location: [facility];  Service: Gastroenterology;  Laterality: N/A;  2:00pm;ASA 3   ESOPHAGEAL DILATION N/A 04/04/2015   Procedure: ESOPHAGEAL DILATION;  Surgeon: Malissa Hippo, MD;  [facility]: [facility];  Service: Endoscopy;  Laterality: N/A;   ESOPHAGEAL DILATION N/A 11/09/2019   Procedure: ESOPHAGEAL DILATION;  Surgeon: Dolores Frame, MD;  [facility]: [facility];  Service: Gastroenterology;  Laterality: N/A;   ESOPHAGOGASTRODUODENOSCOPY N/A 04/04/2015   Procedure: ESOPHAGOGASTRODUODENOSCOPY (EGD);  Surgeon: Malissa Hippo, MD;  [facility]: [facility];  Service: Endoscopy;  Laterality: N/A;  1240   ESOPHAGOGASTRODUODENOSCOPY (EGD) WITH PROPOFOL N/A 11/09/2019   Procedure: ESOPHAGOGASTRODUODENOSCOPY (EGD) WITH PROPOFOL;  Surgeon: Dolores Frame, MD;  [facility]: [facility];  Service: Gastroenterology;  Laterality: N/A;   ESOPHAGOGASTRODUODENOSCOPY (EGD) WITH PROPOFOL N/A 04/04/2022   Procedure: ESOPHAGOGASTRODUODENOSCOPY (EGD) WITH PROPOFOL;  Surgeon: Benancio Deeds, MD;  Location: [facility];  Service: Gastroenterology;  Laterality: N/A;   ESOPHAGOGASTRODUODENOSCOPY (EGD) WITH PROPOFOL  06/30/2022   Procedure: ESOPHAGOGASTRODUODENOSCOPY (EGD) WITH PROPOFOL;  Surgeon: Dolores Frame, MD;  [facility];  Service: Gastroenterology;;   GIVENS CAPSULE STUDY N/A 05/11/2022   Procedure: GIVENS CAPSULE STUDY;  Surgeon: Dolores Frame, MD;  Location: [facility];  Service: Gastroenterology;  Laterality: N/A;  7:30 am   HEMORRHOID SURGERY     HOT HEMOSTASIS  06/30/2022   Procedure: HOT HEMOSTASIS (ARGON PLASMA COAGULATION/BICAP);  Surgeon: Marguerita Merles, Reuel Boom, MD;  Location: [facility];  Service: Gastroenterology;;   POLYPECTOMY  11/09/2019   Procedure: POLYPECTOMY;  Surgeon: Dolores Frame, MD;  [facility];  Service: Gastroenterology;;   POLYPECTOMY  02/27/2021   Procedure: POLYPECTOMY;  Surgeon: Dolores Frame, MD;  Location: [facility];  Service: Gastroenterology;;   POLYPECTOMY  04/05/2022   Procedure: POLYPECTOMY;  Surgeon: Napoleon Form, MD;  Location: [facility] ENDOSCOPY;  Service: Gastroenterology;;   TONSILLECTOMY  Home Medications    Prior to Admission medications   Medication Sig Start Date End Date Taking? Authorizing Provider  albuterol (VENTOLIN HFA) 108 (90 Base) MCG/ACT inhaler Inhale 2 puffs into the lungs every 6 (six) hours as needed. 04/26/23  Yes Leath-Warren, Sadie Haber, NP  amoxicillin-clavulanate (AUGMENTIN) 875-125 MG tablet Take 1 tablet by mouth every 12 (twelve) hours. 04/26/23  Yes Leath-Warren, Sadie Haber, NP  predniSONE (DELTASONE) 20 MG tablet Take 2 tablets (40 mg total) by mouth daily with breakfast for 5  days. 04/26/23 05/01/23 Yes Leath-Warren, Sadie Haber, NP  promethazine-dextromethorphan (PROMETHAZINE-DM) 6.25-15 MG/5ML syrup Take 5 mLs by mouth 4 (four) times daily as needed. 04/26/23  Yes Leath-Warren, Sadie Haber, NP  ACCU-CHEK AVIVA PLUS test strip USE AS INSTRUCTED 06/13/19   Wandra Feinstein, MD  aspirin EC 81 MG tablet Take 1 tablet (81 mg total) by mouth daily. 04/08/22   Leroy Sea, MD  atorvastatin (LIPITOR) 80 MG tablet Take 1 tablet (80 mg total) by mouth daily. 02/17/23   Antoine Poche, MD  Calcium-Magnesium-Zinc (CAL-MAG-ZINC PO) Take 1 tablet by mouth daily.    [provider]  ferrous sulfate 325 (65 FE) MG tablet Take 1 tablet (325 mg total) by mouth daily with breakfast. 06/30/22   Marguerita Merles, Reuel Boom, MD  folic acid (FOLVITE) 1 MG tablet Take 1 tablet (1 mg total) by mouth daily. 04/05/22   Leroy Sea, MD  furosemide (LASIX) 40 MG tablet Take 1 tablet (40 mg total) by mouth as needed for edema (swelling). 04/06/22   Sharlene Dory, NP  gabapentin (NEURONTIN) 300 MG capsule TAKE 1 TO 2 CAPSULES BY MOUTH  TWICE DAILY 07/28/22   Shade Flood, MD  JARDIANCE 10 MG TABS tablet TAKE 1 TABLET BY MOUTH DAILY  BEFORE BREAKFAST 12/21/22   Shade Flood, MD  lisinopril (ZESTRIL) 20 MG tablet Take 1 tablet (20 mg total) by mouth daily. 02/17/23 05/18/23  Antoine Poche, MD  magnesium oxide (MAG-OX) 400 MG tablet Take 400 mg by mouth at bedtime.    [provider]  meloxicam (MOBIC) 7.5 MG tablet Take 1 tablet (7.5 mg total) by mouth daily. 11/22/22   Vickki Hearing, MD  metFORMIN (GLUCOPHAGE) 500 MG tablet TAKE 2 TABLETS BY MOUTH TWICE  DAILY 07/28/22   Shade Flood, MD  metoprolol succinate (TOPROL-XL) 50 MG 24 hr tablet TAKE 1 TABLET BY MOUTH DAILY 04/21/23   Antoine Poche, MD  Multiple Vitamin (MULTIVITAMIN WITH MINERALS) TABS Take 1 tablet by mouth daily.    [provider]  nitroGLYCERIN (NITROSTAT) 0.4 MG SL tablet Place 1  tablet (0.4 mg total) under the tongue every 5 (five) minutes x 3 doses as needed for chest pain (if no relief after 2nd dose, proceed to ED for an evaluation). 02/17/23   Antoine Poche, MD  pantoprazole (PROTONIX) 40 MG tablet TAKE 1 TABLET BY MOUTH DAILY 03/10/23   Dolores Frame, MD    Family History Family History  Problem Relation Age of Onset   Lung cancer Mother    Diabetes Father    Hepatitis B Father    Diabetes Sister    Cushing syndrome Sister     Social History Social History   Tobacco Use   Smoking status: Every Day    Current packs/day: 1.00    Average packs/day: 1 pack/day for 55.4 years (55.4 ttl pk-yrs)    Types: Cigarettes    Start date: 11/26/1967  Smokeless tobacco: Former   Tobacco comments:    smoking x 50 yrs  Vaping Use   Vaping status: Never Used  Substance Use Topics   Alcohol use: Not Currently    Comment: rare   Drug use: Yes    Frequency: 5.0 times per week    Types: Marijuana    Comment: as needed     Allergies   Morphine and codeine   Review of Systems Review of Systems Per HPI  Physical Exam Triage Vital Signs ED Triage Vitals  Encounter Vitals Group     BP 04/26/23 1513 (!) 163/88     Systolic BP Percentile --      Diastolic BP Percentile --      Pulse Rate 04/26/23 1513 80     Resp 04/26/23 1513 18     Temp 04/26/23 1513 98.8 F (37.1 C)     Temp Source 04/26/23 1513 Oral     SpO2 04/26/23 1513 95 %     Weight --      Height --      Head Circumference --      Peak Flow --      Pain Score 04/26/23 1514 0     Pain Loc --      Pain Education --      Exclude from Growth Chart --    No data found.  Updated Vital Signs BP (!) 163/88 (BP Location: Right Arm)   Pulse 80   Temp 98.8 F (37.1 C) (Oral)   Resp 18   SpO2 95%   Visual Acuity Right Eye Distance:   Left Eye Distance:   Bilateral Distance:    Right Eye Near:   Left Eye Near:    Bilateral Near:     Physical Exam Vitals and nursing  note reviewed.  Constitutional:      General: He is not in acute distress.    Appearance: Normal appearance.  HENT:     Head: Normocephalic.     Right Ear: Tympanic membrane, ear canal and external ear normal.     Left Ear: Tympanic membrane, ear canal and external ear normal.     Nose: Nose normal.     Mouth/Throat:     Mouth: Mucous membranes are moist.  Eyes:     Extraocular Movements: Extraocular movements intact.     Conjunctiva/sclera: Conjunctivae normal.     Pupils: Pupils are equal, round, and reactive to light.  Cardiovascular:     Rate and Rhythm: Normal rate and regular rhythm.     Pulses: Normal pulses.     Heart sounds: Normal heart sounds.  Pulmonary:     Effort: Pulmonary effort is normal. No respiratory distress.     Breath sounds: Normal breath sounds. No stridor. No wheezing, rhonchi or rales.  Abdominal:     General: Bowel sounds are normal.     Palpations: Abdomen is soft.     Tenderness: There is no abdominal tenderness.  Musculoskeletal:     Cervical back: Normal range of motion.  Skin:    General: Skin is warm and dry.  Neurological:     General: No focal deficit present.     Mental Status: He is alert and oriented to person, place, and time.  Psychiatric:        Mood and Affect: Mood normal.        Behavior: Behavior normal.      UC Treatments / Results  Labs (all labs ordered are listed,  but only abnormal results are displayed) Labs Reviewed - No data to display  EKG   Radiology No results found.  Procedures Procedures (including critical care time)  Medications Ordered in UC Medications - No data to display  Initial Impression / Assessment and Plan / UC Course  I have reviewed the triage vital signs and the nursing notes.  Pertinent labs & imaging results that were available during my care of the patient were reviewed by me and considered in my medical decision making (see chart for details).  On exam, lung sounds are clear  throughout, room air sats at 95%.  Patient with persistent cough for the past 8 days.  He does have a 55+ year smoking history, cannot rule out COPD as underlying, with probable exacerbation.  Will start patient on prednisone 40 mg, Augmentin 875/125 mg for 7 days, and albuterol inhaler for shortness of breath, and Promethazine DM for the cough.  Discussion with patient regarding smoking cessation.  Patient was also advised to follow-up with pulmonology to reschedule his appointment as soon as possible.  Patient was given strict ER follow-up precautions.  Patient was agreement with this plan of care and verbalized understanding.  All questions were answered.  Patient stable for discharge.  Final Clinical Impressions(s) / UC Diagnoses   Final diagnoses:  Lower respiratory infection  Cough, unspecified type  Smoking history     Discharge Instructions      Take medication as prescribed. May take over-the-counter Tylenol as needed for pain, fever, or general discomfort. Increase fluids and allow for plenty of rest. Recommend using a humidifier at nighttime and sleeping elevated on pillows while symptoms persist. Please consider smoking sensation.  Recommend discussing ways to help you stop smoking with your PCP at your next appointment. Go to the emergency department if you develop shortness of breath, difficulty breathing or other concerns. Please call [facility] to reschedule appointment as soon as possible. Follow-up with PCP as scheduled. Follow-up as needed.      ED Prescriptions     Medication Sig Dispense Auth. Provider   amoxicillin-clavulanate (AUGMENTIN) 875-125 MG tablet Take 1 tablet by mouth every 12 (twelve) hours. 14 tablet Leath-Warren, Sadie Haber, NP   predniSONE (DELTASONE) 20 MG tablet Take 2 tablets (40 mg total) by mouth daily with breakfast for 5 days. 10 tablet Leath-Warren, Sadie Haber, NP   promethazine-dextromethorphan (PROMETHAZINE-DM) 6.25-15 MG/5ML  syrup Take 5 mLs by mouth 4 (four) times daily as needed. 118 mL Leath-Warren, Sadie Haber, NP   albuterol (VENTOLIN HFA) 108 (90 Base) MCG/ACT inhaler Inhale 2 puffs into the lungs every 6 (six) hours as needed. 8 g Leath-Warren, Sadie Haber, NP      PDMP not reviewed this encounter.   Abran Cantor, NP 04/26/23 1545

## 2023-04-27 ENCOUNTER — Other Ambulatory Visit (INDEPENDENT_AMBULATORY_CARE_PROVIDER_SITE_OTHER): Payer: Self-pay

## 2023-04-27 ENCOUNTER — Encounter (INDEPENDENT_AMBULATORY_CARE_PROVIDER_SITE_OTHER): Payer: Self-pay

## 2023-04-27 DIAGNOSIS — K2100 Gastro-esophageal reflux disease with esophagitis, without bleeding: Secondary | ICD-10-CM

## 2023-04-27 DIAGNOSIS — Z8719 Personal history of other diseases of the digestive system: Secondary | ICD-10-CM

## 2023-04-27 DIAGNOSIS — K219 Gastro-esophageal reflux disease without esophagitis: Secondary | ICD-10-CM | POA: Insufficient documentation

## 2023-04-27 MED ORDER — PANTOPRAZOLE SODIUM 40 MG PO TBEC: 40.0000 mg | DELAYED_RELEASE_TABLET | Freq: Two times a day (BID) | ORAL | 3 refills | Status: DC

## 2023-05-11 ENCOUNTER — Other Ambulatory Visit: Payer: Self-pay

## 2023-05-11 MED ORDER — FUROSEMIDE 40 MG PO TABS: 40.0000 mg | ORAL_TABLET | ORAL | 5 refills | Status: DC | PRN

## 2023-05-12 ENCOUNTER — Ambulatory Visit: Payer: Medicare Other | Admitting: Adult Health

## 2023-05-12 ENCOUNTER — Encounter: Payer: Self-pay | Admitting: Adult Health

## 2023-05-12 DIAGNOSIS — F17210 Nicotine dependence, cigarettes, uncomplicated: Secondary | ICD-10-CM | POA: Diagnosis not present

## 2023-05-12 NOTE — Progress Notes (Signed)
  Virtual Visit via Telephone Note  I connected with Jonathan Gilmore , 05/12/23 2:49 PM by a telemedicine application and verified that I am speaking with the correct person using two identifiers.  Location: Patient: home Provider: home   I discussed the limitations of evaluation and management by telemedicine and the availability of in person appointments. The patient expressed understanding and agreed to proceed.   Shared Decision Making Visit Lung Cancer Screening Program [phone])   Eligibility: 73 y.o. Pack Years Smoking History Calculation = 52 pack years (# packs/per year x # years smoked) Recent History of coughing up blood  no Unexplained weight loss? no ( >Than 15 pounds within the last 6 months ) Prior History Lung / other cancer no (Diagnosis within the last 5 years already requiring surveillance chest CT Scans). Smoking Status Current Smoker  Visit Components: Discussion included one or more decision making aids. YES Discussion included risk/benefits of screening. YES Discussion included potential follow up diagnostic testing for abnormal scans. YES Discussion included meaning and risk of over diagnosis. YES Discussion included meaning and risk of False Positives. YES Discussion included meaning of total radiation exposure. YES  Counseling Included: Importance of adherence to annual lung cancer LDCT screening. YES Impact of comorbidities on ability to participate in the program. YES Ability and willingness to under diagnostic treatment. YES  Smoking Cessation Counseling: Current Smokers:  Discussed importance of smoking cessation. yes Information about tobacco cessation classes and interventions provided to patient. yes Patient provided with "ticket" for LDCT Scan. yes Symptomatic Patient. NO Diagnosis Code: Tobacco Use Z72.0 Asymptomatic Patient yes  Counseling (Intermediate counseling: > three minutes counseling) U0454  Z12.2-Screening of respiratory  organs Z87.891-Personal history of nicotine dependence   [name] 05/12/23

## 2023-05-12 NOTE — Patient Instructions (Signed)

## 2023-05-16 ENCOUNTER — Ambulatory Visit (HOSPITAL_COMMUNITY)
Admission: RE | Admit: 2023-05-16 | Discharge: 2023-05-16 | Disposition: A | Discharge status: Home / Self Care | Payer: Medicare Other | Source: Ambulatory Visit | Attending: Acute Care | Admitting: Acute Care

## 2023-05-16 DIAGNOSIS — F17210 Nicotine dependence, cigarettes, uncomplicated: Secondary | ICD-10-CM | POA: Diagnosis present

## 2023-05-16 DIAGNOSIS — Z87891 Personal history of nicotine dependence: Secondary | ICD-10-CM | POA: Diagnosis present

## 2023-05-16 DIAGNOSIS — Z122 Encounter for screening for malignant neoplasm of respiratory organs: Secondary | ICD-10-CM | POA: Diagnosis present

## 2023-05-19 ENCOUNTER — Other Ambulatory Visit: Payer: Medicare Other

## 2023-05-26 ENCOUNTER — Ambulatory Visit: Payer: Medicare Other | Admitting: Oncology

## 2023-05-30 ENCOUNTER — Telehealth: Payer: Self-pay

## 2023-05-30 DIAGNOSIS — R911 Solitary pulmonary nodule: Secondary | ICD-10-CM

## 2023-05-30 NOTE — Telephone Encounter (Signed)
 Call Report from Tiffany:  IMPRESSION: 1. Lung-RADS 3, probably benign findings. Short-term follow-up in 6 months is recommended with repeat low-dose chest CT without contrast (please use the following order, "CT CHEST LCS NODULE FOLLOW-UP W/O CM"). Dominant 6.5 mm solid peripheral left lower lobe pulmonary nodule. 2. Small layering right pleural effusion. 3. Mild cardiomegaly. Three-vessel coronary atherosclerosis. 4. Chronic mild circumferential wall thickening throughout the thoracic esophagus, suggesting chronic esophagitis. 5. Aortic Atherosclerosis (ICD10-I70.0) and Emphysema (ICD10-J43.9).

## 2023-05-30 NOTE — Addendum Note (Signed)
 Addended by: Karlton Lemon on: 05/30/2023 12:39 PM   Modules accepted: Orders

## 2023-05-30 NOTE — Telephone Encounter (Signed)
 Spoke with patient by phone, using two patient identifiers, to review results of recent LDCT.  Nodules noted in lungs with recommendation for follow up LDCT in 6 months.  No recent imaging for comparison.  Patient states he did have the 'flu' a few weeks ago but is feeling fine now.  Atherosclerosis and emphysema noted.  Patient sees cardiology and is on stating medication.  Also noted was probably chronic esophagitis.  Patient is also on medication for reflux disease.   Patient is agreeable for 6 months repeat LDCT> order placed and results/plan faxed to PCP. Patient had no questions at end of discussion.

## 2023-05-30 NOTE — Telephone Encounter (Signed)
 Attempted to reach patient and review results. No answer. LVM requesting call back to the office and review recent lung CT results.

## 2023-06-02 ENCOUNTER — Other Ambulatory Visit: Payer: Self-pay | Admitting: Family Medicine

## 2023-06-02 DIAGNOSIS — M5116 Intervertebral disc disorders with radiculopathy, lumbar region: Secondary | ICD-10-CM

## 2023-07-08 ENCOUNTER — Other Ambulatory Visit: Payer: Self-pay

## 2023-07-08 DIAGNOSIS — E611 Iron deficiency: Secondary | ICD-10-CM

## 2023-07-08 DIAGNOSIS — D500 Iron deficiency anemia secondary to blood loss (chronic): Secondary | ICD-10-CM

## 2023-07-11 ENCOUNTER — Inpatient Hospital Stay: Payer: Medicare Other | Attending: Oncology

## 2023-07-11 DIAGNOSIS — E611 Iron deficiency: Secondary | ICD-10-CM

## 2023-07-11 DIAGNOSIS — D509 Iron deficiency anemia, unspecified: Secondary | ICD-10-CM | POA: Diagnosis present

## 2023-07-11 DIAGNOSIS — D500 Iron deficiency anemia secondary to blood loss (chronic): Secondary | ICD-10-CM

## 2023-07-11 LAB — CBC WITH DIFFERENTIAL/PLATELET
Abs Immature Granulocytes: 0.03 10*3/uL (ref 0.00–0.07)
Basophils Absolute: 0.1 10*3/uL (ref 0.0–0.1)
Basophils Relative: 1 %
Eosinophils Absolute: 0.2 10*3/uL (ref 0.0–0.5)
Eosinophils Relative: 2 %
HCT: 48.4 % (ref 39.0–52.0)
Hemoglobin: 15.9 g/dL (ref 13.0–17.0)
Immature Granulocytes: 0 %
Lymphocytes Relative: 28 %
Lymphs Abs: 2.6 10*3/uL (ref 0.7–4.0)
MCH: 31.1 pg (ref 26.0–34.0)
MCHC: 32.9 g/dL (ref 30.0–36.0)
MCV: 94.5 fL (ref 80.0–100.0)
Monocytes Absolute: 0.8 10*3/uL (ref 0.1–1.0)
Monocytes Relative: 8 %
Neutro Abs: 5.6 10*3/uL (ref 1.7–7.7)
Neutrophils Relative %: 61 %
Platelets: 235 10*3/uL (ref 150–400)
RBC: 5.12 MIL/uL (ref 4.22–5.81)
RDW: 13.6 % (ref 11.5–15.5)
WBC: 9.3 10*3/uL (ref 4.0–10.5)
nRBC: 0 % (ref 0.0–0.2)

## 2023-07-11 LAB — COMPREHENSIVE METABOLIC PANEL WITH GFR
ALT: 20 U/L (ref 0–44)
AST: 24 U/L (ref 15–41)
Albumin: 3.8 g/dL (ref 3.5–5.0)
Alkaline Phosphatase: 96 U/L (ref 38–126)
Anion gap: 9 (ref 5–15)
BUN: 17 mg/dL (ref 8–23)
CO2: 34 mmol/L — ABNORMAL HIGH (ref 22–32)
Calcium: 9.4 mg/dL (ref 8.9–10.3)
Chloride: 94 mmol/L — ABNORMAL LOW (ref 98–111)
Creatinine, Ser: 0.75 mg/dL (ref 0.61–1.24)
GFR, Estimated: >60 mL/min (ref >60)
Glucose, Bld: 169 mg/dL — ABNORMAL HIGH (ref 70–99)
Potassium: 4.4 mmol/L (ref 3.5–5.1)
Sodium: 137 mmol/L (ref 135–145)
Total Bilirubin: 0.9 mg/dL (ref 0.0–1.2)
Total Protein: 7 g/dL (ref 6.5–8.1)

## 2023-07-11 LAB — VITAMIN B12: Vitamin B-12: 512 pg/mL (ref 180–914)

## 2023-07-11 LAB — IRON AND TIBC
Iron: 102 ug/dL (ref 45–182)
Saturation Ratios: 29 % (ref 17.9–39.5)
TIBC: 350 ug/dL (ref 250–450)
UIBC: 248 ug/dL

## 2023-07-11 LAB — FOLATE: Folate: 16.7 ng/mL (ref >5.9)

## 2023-07-11 LAB — FERRITIN: Ferritin: 76 ng/mL (ref 24–336)

## 2023-07-18 ENCOUNTER — Inpatient Hospital Stay: Payer: Medicare Other | Attending: Oncology | Admitting: Oncology

## 2023-07-18 VITALS — BP 158/86 | HR 85 | Temp 97.6°F | Resp 16 | Ht 67.0 in | Wt 172.0 lb

## 2023-07-18 DIAGNOSIS — D509 Iron deficiency anemia, unspecified: Secondary | ICD-10-CM | POA: Insufficient documentation

## 2023-07-18 DIAGNOSIS — Z720 Tobacco use: Secondary | ICD-10-CM | POA: Diagnosis not present

## 2023-07-18 DIAGNOSIS — F17210 Nicotine dependence, cigarettes, uncomplicated: Secondary | ICD-10-CM | POA: Diagnosis not present

## 2023-07-18 DIAGNOSIS — E611 Iron deficiency: Secondary | ICD-10-CM

## 2023-07-18 DIAGNOSIS — C61 Malignant neoplasm of prostate: Secondary | ICD-10-CM | POA: Diagnosis not present

## 2023-07-18 NOTE — Assessment & Plan Note (Signed)
 Iron deficiency likely secondary to bleeding duodenal AVMs which were treated with APC. Resolved at this time  No other hematological needs at this time.  Recommended patient to reach out to us  if he needs us  in future.  Continue to follow with primary care.

## 2023-07-18 NOTE — Patient Instructions (Signed)
 VISIT SUMMARY:  You had a follow-up appointment to check on your iron deficiency. Your iron levels have normalized, and you no longer need to take iron supplements. We also reviewed your past diagnosis of prostate cancer, which is being managed by your urologist, Dr. Claretta Croft. You reported no new symptoms or complaints.  YOUR PLAN:  -PROSTATE CANCER: Prostate cancer is a type of cancer that occurs in the prostate gland. Your condition is being managed by your urologist, Dr. Claretta Croft, and does not require any intervention from hematology or oncology at this time.  -IRON DEFICIENCY ANEMIA: Iron deficiency anemia is a condition where your body lacks enough iron to produce healthy red blood cells. Your iron levels have returned to normal, and you no longer need to take iron supplements. You have been discharged from the clinic, but please call if any issues arise.  INSTRUCTIONS:  You have been discharged from the clinic for your iron deficiency anemia. Please call if any issues arise. Continue to follow up with your urologist, Dr. Claretta Croft, for your prostate cancer management.

## 2023-07-18 NOTE — Assessment & Plan Note (Signed)
 Patient has a history of prostate cancer being followed by Dr. Claretta Croft.  Continue to follow with Dr. Claretta Croft

## 2023-07-18 NOTE — Progress Notes (Signed)
 [facility] at [facility]  HEMATOLOGY FOLLOW-UP VISIT  Jonathan Bras, MD  REASON FOR FOLLOW-UP: Iron deficiency anemia  ASSESSMENT & PLAN:  Patient is a 73 y.o. 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 Resolved at this time  No other hematological needs at this time.  Recommended patient to reach out to us  if he needs us  in future.  Continue to follow with primary care.  Prostate cancer [facility]) Patient has a history of prostate cancer being followed by Dr. Claretta Croft.  Continue to follow with Dr. Claretta Croft   Tobacco use Patient is a current cigarette smoker and refuses to quit at this time  - Encouraged to quit smoking.  Risks of smoking explained including heart disease and cancer.     The total time spent in the appointment was 20 minutes encounter with patients including review of chart and various tests results, discussions about plan of care and coordination of care plan  All questions were answered. The patient knows to call the clinic with any problems, questions or concerns. No barriers to learning was detected.  Eduardo Grade, MD 5/5/20252:45 PM    INTERVAL HISTORY: Jonathan Gilmore 73 y.o. male following for iron deficiency anemia which is resolved at this time.  He is taking his iron supplements in the form of Gummies.  He does not have any complaints at this time and is doing very well overall. He follows with Dr. Claretta Croft for his prostate cancer.  I have reviewed the past medical history, past surgical history, social history and family history with the patient   ALLERGIES:  is allergic to morphine and codeine.  MEDICATIONS:  Current Outpatient Medications  Medication Sig Dispense Refill   ACCU-CHEK AVIVA PLUS test strip USE AS INSTRUCTED 100 strip 3   albuterol  (VENTOLIN  HFA) 108 (90 Base) MCG/ACT inhaler Inhale 2 puffs into  the lungs every 6 (six) hours as needed. 8 g 0   amoxicillin -clavulanate (AUGMENTIN ) 875-125 MG tablet Take 1 tablet by mouth every 12 (twelve) hours. 14 tablet 0   aspirin  EC 81 MG tablet Take 1 tablet (81 mg total) by mouth daily. 30 tablet 11   atorvastatin  (LIPITOR ) 80 MG tablet Take 1 tablet (80 mg total) by mouth daily. 90 tablet 3   Calcium -Magnesium -Zinc (CAL-MAG-ZINC PO) Take 1 tablet by mouth daily.     ferrous sulfate  325 (65 FE) MG tablet Take 1 tablet (325 mg total) by mouth daily with breakfast. 90 tablet 3   folic acid  (FOLVITE ) 1 MG tablet Take 1 tablet (1 mg total) by mouth daily. 30 tablet 0   furosemide  (LASIX ) 40 MG tablet Take 1 tablet (40 mg total) by mouth as needed for edema (swelling). 30 tablet 5   gabapentin  (NEURONTIN ) 300 MG capsule TAKE 1 TO 2 CAPSULES BY MOUTH  TWICE DAILY 360 capsule 3   JARDIANCE  10 MG TABS tablet TAKE 1 TABLET BY MOUTH DAILY  BEFORE BREAKFAST 90 tablet 3   lisinopril  (ZESTRIL ) 20 MG tablet Take 1 tablet (20 mg total) by mouth daily. 90 tablet 3   magnesium  oxide (MAG-OX) 400 MG tablet Take 400 mg by mouth at bedtime.     meloxicam  (MOBIC ) 7.5 MG tablet Take 1 tablet (7.5 mg total) by mouth daily. 30 tablet 5   metFORMIN  (GLUCOPHAGE ) 500 MG tablet TAKE 2 TABLETS BY MOUTH TWICE  DAILY 360 tablet 3   metoprolol  succinate (  TOPROL -XL) 50 MG 24 hr tablet TAKE 1 TABLET BY MOUTH DAILY 90 tablet 3   Multiple Vitamin (MULTIVITAMIN WITH MINERALS) TABS Take 1 tablet by mouth daily.     nitroGLYCERIN  (NITROSTAT ) 0.4 MG SL tablet Place 1 tablet (0.4 mg total) under the tongue every 5 (five) minutes x 3 doses as needed for chest pain (if no relief after 2nd dose, proceed to ED for an evaluation). 25 tablet 3   pantoprazole  (PROTONIX ) 40 MG tablet Take 1 tablet (40 mg total) by mouth 2 (two) times daily. 180 tablet 3   promethazine -dextromethorphan (PROMETHAZINE -DM) 6.25-15 MG/5ML syrup Take 5 mLs by mouth 4 (four) times daily as needed. 118 mL 0   No current  facility-administered medications for this visit.     REVIEW OF SYSTEMS:   Constitutional: Denies fevers, chills or night sweats Eyes: Denies blurriness of vision Ears, nose, mouth, throat, and face: Denies mucositis or sore throat Respiratory: Denies cough, dyspnea or wheezes Cardiovascular: Denies palpitation, chest discomfort or lower extremity swelling Gastrointestinal:  Denies nausea, heartburn or change in bowel habits Skin: Denies abnormal skin rashes Lymphatics: Denies new lymphadenopathy or easy bruising Neurological:Denies numbness, tingling or new weaknesses Behavioral/Psych: Mood is stable, no new changes  All other systems were reviewed with the patient and are negative.  PHYSICAL EXAMINATION:   Vitals:   07/18/23 1409  BP: (!) 158/86  Pulse: 85  Resp: 16  Temp: 97.6 F (36.4 C)  SpO2: 98%    GENERAL:alert, no distress and comfortable SKIN: skin color, texture, turgor are normal, no rashes or significant lesions LUNGS: clear to auscultation and percussion with normal breathing effort HEART: regular rate & rhythm and no murmurs and no lower extremity edema ABDOMEN:abdomen soft, non-tender and normal bowel sounds Musculoskeletal:no cyanosis of digits and no clubbing  NEURO: alert & oriented x 3 with fluent speech  LABORATORY DATA:  I have reviewed the data as listed  Lab Results  Component Value Date   WBC 9.3 07/11/2023   NEUTROABS 5.6 07/11/2023   HGB 15.9 07/11/2023   HCT 48.4 07/11/2023   MCV 94.5 07/11/2023   PLT 235 07/11/2023      Chemistry      Component Value Date/Time   NA 137 07/11/2023 1333   K 4.4 07/11/2023 1333   CL 94 (L) 07/11/2023 1333   CO2 34 (H) 07/11/2023 1333   BUN 17 07/11/2023 1333   CREATININE 0.75 07/11/2023 1333   CREATININE 0.70 08/13/2020 0859      Component Value Date/Time   CALCIUM  9.4 07/11/2023 1333   ALKPHOS 96 07/11/2023 1333   AST 24 07/11/2023 1333   ALT 20 07/11/2023 1333   BILITOT 0.9 07/11/2023 1333       Latest Reference Range & Units 07/11/23 13:32 07/11/23 13:33  Iron 45 - 182 ug/dL  161  UIBC ug/dL  096  TIBC 045 - 409 ug/dL  811  Saturation Ratios 17.9 - 39.5 %  29  Ferritin 24 - 336 ng/mL  76  Folate >5.9 ng/mL 16.7   Vitamin B12 180 - 914 pg/mL  512

## 2023-07-18 NOTE — Assessment & Plan Note (Signed)
 Patient is a current cigarette smoker and refuses to quit at this time  - Encouraged to quit smoking.  Risks of smoking explained including heart disease and cancer.

## 2023-08-09 ENCOUNTER — Ambulatory Visit (INDEPENDENT_AMBULATORY_CARE_PROVIDER_SITE_OTHER): Payer: Medicare Other | Admitting: Gastroenterology

## 2023-08-09 LAB — HM DIABETES EYE EXAM

## 2023-08-23 ENCOUNTER — Ambulatory Visit: Payer: Medicare Other | Attending: Cardiology | Admitting: Cardiology

## 2023-08-23 ENCOUNTER — Encounter: Payer: Self-pay | Admitting: Cardiology

## 2023-08-23 ENCOUNTER — Encounter: Payer: Self-pay | Admitting: *Deleted

## 2023-08-23 VITALS — BP 122/72 | HR 78 | Ht 67.0 in | Wt 170.0 lb

## 2023-08-23 DIAGNOSIS — I10 Essential (primary) hypertension: Secondary | ICD-10-CM | POA: Diagnosis not present

## 2023-08-23 DIAGNOSIS — I2510 Atherosclerotic heart disease of native coronary artery without angina pectoris: Secondary | ICD-10-CM

## 2023-08-23 DIAGNOSIS — I480 Paroxysmal atrial fibrillation: Secondary | ICD-10-CM

## 2023-08-23 DIAGNOSIS — E782 Mixed hyperlipidemia: Secondary | ICD-10-CM

## 2023-08-23 DIAGNOSIS — I350 Nonrheumatic aortic (valve) stenosis: Secondary | ICD-10-CM

## 2023-08-23 MED ORDER — APIXABAN 5 MG PO TABS: 5.0000 mg | ORAL_TABLET | Freq: Two times a day (BID) | ORAL | 11 refills | Status: DC

## 2023-08-23 NOTE — Progress Notes (Signed)
 Clinical Summary [name] is a 73 y.o.male seen today for follow up of the following medical problems.      1. CAD - CT scan chest 02/2013 with 3 vessel CAD, severe LAD disease. - Jan 2015 nuclear stress without clear ischemia - admit 07/2015 with acute pulmonary edema in setting of severe HTN. Referred for cath.  - cath 07/2015 with 50% LAD disease, overall patent coronaries - his presentation with pulmonary edema likely secondary to severe HTN in setting of restrictive diastolic dysfunction. - echo 07/2015 LVEF 45-50%, restrictive diastolic dysfunction 08/2020 echo LVEF 60-65%  02/2021 nuclear stress: small inferior infarct, no current ischemia     - no chest pains. Some SOB/DOE with walking over 1 mile he reports     2.Afib -new diagnosis 05/2022 - recent GI bleeding from AVMs, not started on anticoag at the time   --no recent palpitations -compliant with meds - prior GI bleeding has resolved, last Hgb 15.      3. Severe OSA - not using CPAP machine due to discomfort. Stopped several years ago     4. Hyperlipidemia -11/2018 TC 82 HDL 25 TG 137 LDL 35 - he is compliant with statin   - 08/2019 TC 106 HDL 37 TG 100 LDL 51  - 04/2020 TC 103 HDL 35 TG 143 LDL 45 - 11/2020 TC 87 HDL 35 LDL 38 TG 69   08/2022 TC 96 TG 135 HDL 34 LDL 35 - compliant with meds   4. Aortic stenosis - echo 07/2015 mild to moderate. Mean gradient 16, AVA reported at 0.9 with dimensionless index 0.17 Mar 2018 echo LVEF 60-65%, mild to moderate stenosis   08/2020 echo LVEF 60-65%, no WMAs, grade I dd, mild MR, mild AI, mild to mod AS mean grade 17, AVA VTI 1.12 DI 0.29 - no recent symptoms.    Jan 2024 echo: LVEF 55-60%, moderate AS mean grad 23 AVA VTI 0.90 DI 0.26 SVI 38  - seen by PA Strader, she discussed with hematology lab results for any form of von Willebrand's disease. Labs show no evidence and therefore no evidence of Heyde syndrome in relation to his AVMs    Jan 2025 echo: LVEF  60-65%, no WMAs, severe paradoxical low flow low gradient AS mean grad 22, AVA VTI 0.80, DI 0.21, SVI 27       5. HTN - compliant withmeds     6. Chronic diastolic HF - mild edema typically develops later in the day - has lasix  40mg , takes daily, will take additional prn.    7. DM2  - followed by pcp     8. Claudication/PAD - followed by vascular, last visit 01/2022 - denies significant claudication.    9. Dysphagia - episode of food getting stuck, reports prior issues along with esophageal dilatation in the past. Asking to reestablish with GI, reports also due for colonscopy   10.Neuropathy   11. Prostate cancer - followed by urology   12. GI bleed - followed by GI, issues with AVMs.     AAA screen: 2017 US  was normal   SH: his wife, Jonathan Gilmore is also a patient of mine. 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  Past Medical History:  Diagnosis Date   Abnormal cardiovascular stress test 03/2013   Anxiety    Arthritis    Back pain    Cancer (HCC)  Prostate cancer-no treatment per pt   CHF (congestive heart failure) (HCC)    Coronary artery disease    Multivessel with significant LAD involvement by chest CT 2014   Depression    Diastolic dysfunction    Essential hypertension    Lumbar herniated disc    L4-L5   MRSA (methicillin resistant staph aureus) culture positive    Myocardial infarction (HCC)    Neuropathy    Shortness of breath dyspnea    Sleep apnea    No cpap   Type 2 diabetes mellitus (HCC)      Allergies  Allergen Reactions   Morphine And Codeine Nausea And Vomiting     Current Outpatient Medications  Medication Sig Dispense Refill   ACCU-CHEK AVIVA PLUS test strip USE AS INSTRUCTED 100 strip 3   albuterol  (VENTOLIN  HFA) 108 (90 Base) MCG/ACT inhaler Inhale 2 puffs into the lungs every 6 (six) hours as needed. 8 g 0   amoxicillin -clavulanate  (AUGMENTIN ) 875-125 MG tablet Take 1 tablet by mouth every 12 (twelve) hours. 14 tablet 0   aspirin  EC 81 MG tablet Take 1 tablet (81 mg total) by mouth daily. 30 tablet 11   atorvastatin  (LIPITOR ) 80 MG tablet Take 1 tablet (80 mg total) by mouth daily. 90 tablet 3   Calcium -Magnesium -Zinc (CAL-MAG-ZINC PO) Take 1 tablet by mouth daily.     ferrous sulfate  325 (65 FE) MG tablet Take 1 tablet (325 mg total) by mouth daily with breakfast. 90 tablet 3   folic acid  (FOLVITE ) 1 MG tablet Take 1 tablet (1 mg total) by mouth daily. 30 tablet 0   furosemide  (LASIX ) 40 MG tablet Take 1 tablet (40 mg total) by mouth as needed for edema (swelling). 30 tablet 5   gabapentin  (NEURONTIN ) 300 MG capsule TAKE 1 TO 2 CAPSULES BY MOUTH  TWICE DAILY 360 capsule 3   JARDIANCE  10 MG TABS tablet TAKE 1 TABLET BY MOUTH DAILY  BEFORE BREAKFAST 90 tablet 3   lisinopril  (ZESTRIL ) 20 MG tablet Take 1 tablet (20 mg total) by mouth daily. 90 tablet 3   magnesium  oxide (MAG-OX) 400 MG tablet Take 400 mg by mouth at bedtime.     meloxicam  (MOBIC ) 7.5 MG tablet Take 1 tablet (7.5 mg total) by mouth daily. 30 tablet 5   metFORMIN  (GLUCOPHAGE ) 500 MG tablet TAKE 2 TABLETS BY MOUTH TWICE  DAILY 360 tablet 3   metoprolol  succinate (TOPROL -XL) 50 MG 24 hr tablet TAKE 1 TABLET BY MOUTH DAILY 90 tablet 3   Multiple Vitamin (MULTIVITAMIN WITH MINERALS) TABS Take 1 tablet by mouth daily.     nitroGLYCERIN  (NITROSTAT ) 0.4 MG SL tablet Place 1 tablet (0.4 mg total) under the tongue every 5 (five) minutes x 3 doses as needed for chest pain (if no relief after 2nd dose, proceed to ED for an evaluation). 25 tablet 3   pantoprazole  (PROTONIX ) 40 MG tablet Take 1 tablet (40 mg total) by mouth 2 (two) times daily. 180 tablet 3   promethazine -dextromethorphan (PROMETHAZINE -DM) 6.25-15 MG/5ML syrup Take 5 mLs by mouth 4 (four) times daily as needed. 118 mL 0   No current facility-administered medications for this visit.     Past Surgical  History:  Procedure Laterality Date   ANTERIOR CERVICAL DECOMP/DISCECTOMY FUSION  01/18/2011   Procedure: ANTERIOR CERVICAL DECOMPRESSION/DISCECTOMY FUSION 2 LEVELS;  Surgeon: Awilda Bogus Pool;  Location: [facility];  Service: Neurosurgery;  Laterality: N/A;  anterior cervical discectomy and fusion with allograft and plating, cervical five-six,  cervical six-seven   BIOPSY N/A 04/04/2015   Procedure: BIOPSY;  Surgeon: Ruby Corporal, MD;  Location: [facility];  Service: Endoscopy;  Laterality: N/A;   BIOPSY  11/09/2019   Procedure: BIOPSY;  Surgeon: Urban Garden, MD;  Location: [facility];  Service: Gastroenterology;;   Bone spur removed  1997   Left shoulder   CARDIAC CATHETERIZATION N/A 08/08/2015   Procedure: Right/Left Heart Cath and Coronary Angiography;  Surgeon: Arnoldo Lapping, MD;  Location: [facility] INVASIVE CV LAB;  Service: Cardiovascular;  Laterality: N/A;   CARPAL TUNNEL RELEASE Right    COLONOSCOPY WITH PROPOFOL  N/A 11/09/2019   Procedure: COLONOSCOPY WITH PROPOFOL ;  Surgeon: Urban Garden, MD;  Location: [facility];  Service: Gastroenterology;  Laterality: N/A;  900   COLONOSCOPY WITH PROPOFOL  N/A 02/27/2021   Procedure: COLONOSCOPY WITH PROPOFOL ;  Surgeon: Urban Garden, MD;  Location: [facility];  Service: Gastroenterology;  Laterality: N/A;  7:30   COLONOSCOPY WITH PROPOFOL  N/A 04/05/2022   Procedure: COLONOSCOPY WITH PROPOFOL ;  Surgeon: Sergio Dandy, MD;  Location: [facility];  Service: Gastroenterology;  Laterality: N/A;   ENTEROSCOPY N/A 06/30/2022   Procedure: ENTEROSCOPY;  Surgeon: Urban Garden, MD;  Location: [facility];  Service: Gastroenterology;  Laterality: N/A;  2:00pm;ASA 3   ESOPHAGEAL DILATION N/A 04/04/2015   Procedure: ESOPHAGEAL DILATION;  Surgeon: Ruby Corporal, MD;  Location: [facility];  Service: Endoscopy;  Laterality: N/A;   ESOPHAGEAL DILATION N/A 11/09/2019   Procedure: ESOPHAGEAL DILATION;   Surgeon: Urban Garden, MD;  Location: [facility];  Service: Gastroenterology;  Laterality: N/A;   ESOPHAGOGASTRODUODENOSCOPY N/A 04/04/2015   Procedure: ESOPHAGOGASTRODUODENOSCOPY (EGD);  Surgeon: Ruby Corporal, MD;  [facility]: [facility];  Service: Endoscopy;  Laterality: N/A;  1240   ESOPHAGOGASTRODUODENOSCOPY (EGD) WITH PROPOFOL  N/A 11/09/2019   Procedure: ESOPHAGOGASTRODUODENOSCOPY (EGD) WITH PROPOFOL ;  Surgeon: Urban Garden, MD;  [facility]: [facility];  Service: Gastroenterology;  Laterality: N/A;   ESOPHAGOGASTRODUODENOSCOPY (EGD) WITH PROPOFOL  N/A 04/04/2022   Procedure: ESOPHAGOGASTRODUODENOSCOPY (EGD) WITH PROPOFOL ;  Surgeon: Ace Holder, MD;  Location: [facility] ENDOSCOPY;  Service: Gastroenterology;  Laterality: N/A;   ESOPHAGOGASTRODUODENOSCOPY (EGD) WITH PROPOFOL   06/30/2022   Procedure: ESOPHAGOGASTRODUODENOSCOPY (EGD) WITH PROPOFOL ;  Surgeon: Urban Garden, MD;  [facility]: [facility];  Service: Gastroenterology;;   GIVENS CAPSULE STUDY N/A 05/11/2022   Procedure: GIVENS CAPSULE STUDY;  Surgeon: Urban Garden, MD;  [facility]: [facility];  Service: Gastroenterology;  Laterality: N/A;  7:30 am   HEMORRHOID SURGERY     HOT HEMOSTASIS  06/30/2022   Procedure: HOT HEMOSTASIS (ARGON PLASMA COAGULATION/BICAP);  Surgeon: Umberto Ganong, Bearl Limes, MD;  Location: [facility];  Service: Gastroenterology;;   POLYPECTOMY  11/09/2019   Procedure: POLYPECTOMY;  Surgeon: Urban Garden, MD;  [facility]: [facility];  Service: Gastroenterology;;   POLYPECTOMY  02/27/2021   Procedure: POLYPECTOMY;  Surgeon: Urban Garden, MD;  Location: [facility];  Service: Gastroenterology;;   POLYPECTOMY  04/05/2022   Procedure: POLYPECTOMY;  Surgeon: Sergio Dandy, MD;  Location: [facility] ENDOSCOPY;  Service: Gastroenterology;;   TONSILLECTOMY       Allergies  Allergen Reactions   Morphine And Codeine Nausea And Vomiting       Family History  Problem Relation Age of Onset   Lung cancer Mother    Diabetes Father    Hepatitis B Father    Diabetes Sister    Cushing syndrome Sister  Social History Mr. Lassen reports that he has been smoking cigarettes. He started smoking about 55 years ago. He has a 55.7 pack-year smoking history. He has quit using smokeless tobacco. Mr. Nuon reports that he does not currently use alcohol.    Physical Examination Today's Vitals   08/23/23 1036  BP: 122/72  Pulse: 78  SpO2: 95%  Weight: 170 lb (77.1 kg)  Height: 5\' 7"  (1.702 m)   Body mass index is 26.63 kg/m.  Gen: resting comfortably, no acute distress HEENT: no scleral icterus, pupils equal round and reactive, no palptable cervical adenopathy,  CV: irreg, 3/6 systolic murmur rusb, no jvd Resp: Clear to auscultation bilaterally GI: abdomen is soft, non-tender, non-distended, normal bowel sounds, no hepatosplenomegaly MSK: extremities are warm, no edema.  Skin: warm, no rash Neuro:  no focal deficits Psych: appropriate affect   Diagnostic Studies  Jan 2015 MPI IMPRESSION: 1.  Abnormal exercise myocardial perfusion imaging stress test   2. Inferior wall defect likely due to subdiaphragmatic attenuation, its wall motion is similar to other areas without a perfusion defect.   3. Likely small anteroseptal infract, this area is more hypokinetic than the rest of the myocardium   3.  Low left ventricular systolic function with global hypokinesis   4. Mildly reduced functional capacity (90% of age and gender predicted)   5. Overall increased risk for major cardiac events based on low ejection fraction. There is no current myocardium at jeopardy.     02/2013 echo Study Conclusions  - Left ventricle: The cavity size was normal. There was mild   concentric hypertrophy. Systolic function was normal. The   estimated ejection fraction was in the range of 55% to   60%. Wall motion was normal;  there were no regional wall   motion abnormalities. There is mildly asynchronous   contraction consistent with intraventricular conduction   delay (IVCD) or bundle Jaqulyn Chancellor block. Doppler parameters   are consistent with abnormal left ventricular relaxation   (grade 1 diastolic dysfunction). - Aortic valve: Cusp separation was mildly reduced. There   was mild to moderate stenosis. - Left atrium: The atrium was moderately dilated.   07/2015 echo   Study Conclusions   - Left ventricle: The cavity size was normal. Wall thickness was   increased in a pattern of mild LVH. Systolic function was mildly   reduced. The estimated ejection fraction was in the range of 45%   to 50%. Diffuse hypokinesis. Doppler parameters are consistent   with restrictive physiology, indicative of decreased left   ventricular diastolic compliance and/or increased left atrial   pressure. Doppler parameters are consistent with high ventricular   filling pressure. - Aortic valve: Valve mobility was restricted. There was mild to   moderate stenosis. Valve area (VTI): 0.96 cm^2. Valve area   (Vmax): 0.91 cm^2. Valve area (Vmean): 0.89 cm^2. - Mitral valve: Calcified annulus. There was mild regurgitation. - Left atrium: The atrium was moderately dilated. - Right atrium: The atrium was mildly dilated. - Atrial septum: There was a patent foramen ovale.   Impressions:   - Mild global reduction in LV function; restrictive filling with   elevated filling pressure; mild LVH; biatrial enlargement;   heavily calcified aortic valve with probable mild to moderate AS   (mean gradient 16 mmHg; calculated AVA .9 cm2 likely   overestimates severity; dimensionless index .3 not supportive of   severe AS); mild MR; patent foramen ovale.   07/2015 Cath Prox LAD to Mid LAD lesion,  50% stenosed.   1. Calcified moderate proximal-mid LAD stenosis 2. Widely patent, dominant LCx 3. Preserved cardiac output, mildly elevated right-sided  intracardiac pressures   Recommend: Medical therapy for nonobstructive CAD and CHF   01/2016 AAA US  Screen No aneurysm   08/2020 echo   IMPRESSIONS     1. Left ventricular ejection fraction, by estimation, is 60 to 65%. The  left ventricle has normal function. The left ventricle has no regional  wall motion abnormalities. There is moderate left ventricular hypertrophy.  Left ventricular diastolic  parameters are consistent with Grade I diastolic dysfunction (impaired  relaxation). Elevated left atrial pressure. The average left ventricular  global longitudinal strain is -17.6 %.   2. Right ventricular systolic function is normal. The right ventricular  size is normal.   3. The mitral valve is abnormal. Mild mitral valve regurgitation. No  evidence of mitral stenosis.   4. The aortic valve has an indeterminant number of cusps. There is  moderate calcification of the aortic valve. There is moderate thickening  of the aortic valve. Aortic valve regurgitation is mild. Mild to moderate  aortic valve stenosis. Mild to moderate   aortic stenosis is present. Aortic valve mean gradient measures 17.0  mmHg. Aortic valve peak gradient measures 26.7 mmHg. Aortic valve area, by  VTI measures 1.12 cm.    02/2021 nuclear stress  Findings are consistent with prior myocardial infarction. The study is low risk.   1 mm down sloping ST depression in the inferolateral leads was noted with exercise.  Patient did not achieve target HR and was switched to Lexiscan .  There was no significant ST deviation during Lexiscan  infusion.   Left ventricular function is abnormal. Global function is mildly reduced. Nuclear stress EF: 50 %. The left ventricular ejection fraction is mildly decreased (45-54%). End diastolic cavity size is mildly enlarged.   Abnormal myovue with motion and diaphragmatic attenuation Possible small inferior wall infarct from apex to base No ischemia EF 50% with inferior and apical  hypokinesis      Jan 2025 echo 1. Left ventricular ejection fraction, by estimation, is 60 to 65%. The  left ventricle has normal function. The left ventricle has no regional  wall motion abnormalities. There is moderate left ventricular hypertrophy.  Left ventricular diastolic  parameters are indeterminate.   2. Right ventricular systolic function is normal. The right ventricular  size is normal.   3. Left atrial size was moderately dilated.   4. The mitral valve is normal in structure. No evidence of mitral valve  regurgitation. No evidence of mitral stenosis.   5. LVOT possibly underestimated. Data would be consistent with severe  paradoxical low flow low gradient aortic stenosis. Morphologically the  valve looks to be severe. DI 0.21 mean gradient 22, AVA VTI 0.80, SVI 27.  The aortic valve has an indeterminant  number of cusps. There is moderate calcification of the aortic valve.  There is moderate thickening of the aortic valve. Aortic valve  regurgitation is mild. Aortic valve area, by VTI measures 0.80 cm. Aortic  valve mean gradient measures 22.0 mmHg. Aortic   valve Vmax measures 3.05 m/s.   6. Aortic dilatation noted. There is mild dilatation of the ascending  aorta, measuring 36 mm.   7. PFO with moderate left to right shunt.     Assessment and Plan   1. CAD - moderate nonobstructive disease by prior cath.  - recent low risk stress test -no recent symptoms, continue current meds  2. Hyperlipidemia - has been at goal, continue current meds   3. Aortic stenosis -Jan 2025 echo consistent with paradoxical low flow low gradient AS - he denies symptoms. We will plan on GXT for a more objective assessment, does not need to hold beta beta blocker as this is purely for exercise tolerance.    4. HTN - he is at goal, continue current meds   5. Chronic diasotlic heart failure -euvolemic without symptoms, continue current meds    6.Afib - had not started anticoag  due to recent GI bleed that is now resolved. D/c ASA, start eliquis 5mg  bid - EKG today shows rate controlled afib  F/u 4 months      Laurann Pollock, M.D.

## 2023-08-23 NOTE — Patient Instructions (Addendum)
 Medication Instructions:  Your physician has recommended you make the following change in your medication:   Stop Taking Aspirin   Start Taking Eliquis 5 mg Two Times Daily    *If you need a refill on your cardiac medications before your next appointment, please call your pharmacy*  Lab Work: Your physician recommends that you return for lab work in: 2 Weeks ( CBC)   If you have labs (blood work) drawn today and your tests are completely normal, you will receive your results only by: MyChart Message (if you have MyChart) OR A paper copy in the mail If you have any lab test that is abnormal or we need to change your treatment, we will call you to review the results.  Testing/Procedures: Your physician has requested that you have an exercise tolerance test. For further information please visit [email or web address] Please also follow instruction sheet, as given.   Follow-Up: At [facility], you and your health needs are our priority.  As part of our continuing mission to provide you with exceptional heart care, our providers are all part of one team.  This team includes your primary Cardiologist (physician) and Advanced Practice Providers or APPs (Physician Assistants and Nurse Practitioners) who all work together to provide you with the care you need, when you need it.  Your next appointment:   4 month(s)  Provider:   You may see Armida Lander, MD or one of the following Advanced Practice Providers on your designated Care Team:   Woodfin Hays, PA-C  [place], [place] Theotis Flake, [place]     We recommend signing up for the patient portal called "MyChart".  Sign up information is provided on this After Visit Summary.  MyChart is used to connect with patients for Virtual Visits (Telemedicine).  Patients are able to view lab/test results, encounter notes, upcoming appointments, etc.  Non-urgent messages can be sent to your provider as well.   To learn more about what  you can do with MyChart, go to [email or web address].   Other Instructions Thank you for choosing [facility]!

## 2023-08-31 ENCOUNTER — Other Ambulatory Visit: Payer: Self-pay

## 2023-08-31 ENCOUNTER — Ambulatory Visit (HOSPITAL_COMMUNITY)
Admission: RE | Admit: 2023-08-31 | Discharge: 2023-08-31 | Disposition: A | Discharge status: Home / Self Care | Source: Ambulatory Visit | Attending: Cardiology | Admitting: Cardiology

## 2023-08-31 ENCOUNTER — Encounter (HOSPITAL_COMMUNITY): Payer: Self-pay

## 2023-08-31 DIAGNOSIS — I480 Paroxysmal atrial fibrillation: Secondary | ICD-10-CM

## 2023-08-31 DIAGNOSIS — I2510 Atherosclerotic heart disease of native coronary artery without angina pectoris: Secondary | ICD-10-CM

## 2023-08-31 DIAGNOSIS — I350 Nonrheumatic aortic (valve) stenosis: Secondary | ICD-10-CM | POA: Insufficient documentation

## 2023-09-01 ENCOUNTER — Encounter: Payer: Self-pay | Admitting: Cardiology

## 2023-09-02 ENCOUNTER — Encounter: Payer: Self-pay | Admitting: Family Medicine

## 2023-09-02 ENCOUNTER — Other Ambulatory Visit: Payer: Self-pay

## 2023-09-02 DIAGNOSIS — E1159 Type 2 diabetes mellitus with other circulatory complications: Secondary | ICD-10-CM

## 2023-09-02 MED ORDER — METFORMIN HCL 500 MG PO TABS: 1000.0000 mg | ORAL_TABLET | Freq: Two times a day (BID) | ORAL | 0 refills | Status: DC

## 2023-09-06 ENCOUNTER — Ambulatory Visit (HOSPITAL_COMMUNITY)

## 2023-09-06 ENCOUNTER — Telehealth (HOSPITAL_COMMUNITY): Payer: Self-pay | Admitting: *Deleted

## 2023-09-06 NOTE — Telephone Encounter (Signed)
 Patient called to info that he was going to be late for stress test.  Having to put arm in sling, hurt his shoulder.  Instructed that his test will have to be reschedule, due to safety while on treadmill with arm in sling. He will call back when shoulder in better. Having a lot of pain at this time.

## 2023-09-08 ENCOUNTER — Other Ambulatory Visit (INDEPENDENT_AMBULATORY_CARE_PROVIDER_SITE_OTHER): Payer: Self-pay

## 2023-09-08 ENCOUNTER — Ambulatory Visit: Admitting: Orthopedic Surgery

## 2023-09-08 DIAGNOSIS — M25512 Pain in left shoulder: Secondary | ICD-10-CM

## 2023-09-08 DIAGNOSIS — S46002A Unspecified injury of muscle(s) and tendon(s) of the rotator cuff of left shoulder, initial encounter: Secondary | ICD-10-CM

## 2023-09-08 MED ORDER — OXYCODONE-ACETAMINOPHEN 5-325 MG PO TABS: 1.0000 | ORAL_TABLET | Freq: Three times a day (TID) | ORAL | 0 refills | Status: AC | PRN

## 2023-09-08 NOTE — Progress Notes (Signed)
 New problem  Chief Complaint  Patient presents with   Shoulder Injury    L after picking up portable AC unit DOI 09/03/23. Pain has gotten worse since incident    73 year old male with various medical problems presents with pain in the posterior aspect of his left shoulder after picking up a portable AC unit with a recorded date of injury June 21  Patient says his pain is very severe  He is wearing a sling on his left shoulder, pain is worsening Pain is associated with inability to abduct or externally rotate or flex his left shoulder joint  Past Medical History:  Diagnosis Date   Abnormal cardiovascular stress test 03/2013   Anxiety    Arthritis    Back pain    Cancer (HCC)    Prostate cancer-no treatment per pt   CHF (congestive heart failure) (HCC)    Coronary artery disease    Multivessel with significant LAD involvement by chest CT 2014   Depression    Diastolic dysfunction    Essential hypertension    Lumbar herniated disc    L4-L5   MRSA (methicillin resistant staph aureus) culture positive    Myocardial infarction (HCC)    Neuropathy    Shortness of breath dyspnea    Sleep apnea    No cpap   Type 2 diabetes mellitus (HCC)    Past Surgical History:  Procedure Laterality Date   ANTERIOR CERVICAL DECOMP/DISCECTOMY FUSION  01/18/2011   Procedure: ANTERIOR CERVICAL DECOMPRESSION/DISCECTOMY FUSION 2 LEVELS;  Surgeon: Victory LABOR Pool;  [facility];  Service: Neurosurgery;  Laterality: N/A;  anterior cervical discectomy and fusion with allograft and plating, cervical five-six, cervical six-seven   BIOPSY N/A 04/04/2015   Procedure: BIOPSY;  Surgeon: Claudis RAYMOND Rivet, MD;  [facility];  Service: Endoscopy;  Laterality: N/A;   BIOPSY  11/09/2019   Procedure: BIOPSY;  Surgeon: Eartha Angelia Sieving, MD;  [facility];  Service: Gastroenterology;;   Bone spur removed  1997   Left shoulder   CARDIAC CATHETERIZATION N/A 08/08/2015   Procedure:  Right/Left Heart Cath and Coronary Angiography;  Surgeon: Ozell Fell, MD;  Location: [facility] INVASIVE CV LAB;  Service: Cardiovascular;  Laterality: N/A;   CARPAL TUNNEL RELEASE Right    COLONOSCOPY WITH PROPOFOL  N/A 11/09/2019   Procedure: COLONOSCOPY WITH PROPOFOL ;  Surgeon: Eartha Angelia Sieving, MD;  [facility];  Service: Gastroenterology;  Laterality: N/A;  900   COLONOSCOPY WITH PROPOFOL  N/A 02/27/2021   Procedure: COLONOSCOPY WITH PROPOFOL ;  Surgeon: Eartha Angelia Sieving, MD;  [facility];  Service: Gastroenterology;  Laterality: N/A;  7:30   COLONOSCOPY WITH PROPOFOL  N/A 04/05/2022   Procedure: COLONOSCOPY WITH PROPOFOL ;  Surgeon: Shila Gustav GAILS, MD;  [facility];  Service: Gastroenterology;  Laterality: N/A;   ENTEROSCOPY N/A 06/30/2022   Procedure: ENTEROSCOPY;  Surgeon: Eartha Angelia Sieving, MD;  [facility];  Service: Gastroenterology;  Laterality: N/A;  2:00pm;ASA 3   ESOPHAGEAL DILATION N/A 04/04/2015   Procedure: ESOPHAGEAL DILATION;  Surgeon: Claudis RAYMOND Rivet, MD;  [facility];  Service: Endoscopy;  Laterality: N/A;   ESOPHAGEAL DILATION N/A 11/09/2019   Procedure: ESOPHAGEAL DILATION;  Surgeon: Eartha Angelia Sieving, MD;  [facility];  Service: Gastroenterology;  Laterality: N/A;   ESOPHAGOGASTRODUODENOSCOPY N/A 04/04/2015   Procedure: ESOPHAGOGASTRODUODENOSCOPY (EGD);  Surgeon: Claudis RAYMOND Rivet, MD;  [facility];  Service: Endoscopy;  Laterality: N/A;  1240   ESOPHAGOGASTRODUODENOSCOPY (  EGD) WITH PROPOFOL  N/A 11/09/2019   Procedure: ESOPHAGOGASTRODUODENOSCOPY (EGD) WITH PROPOFOL ;  Surgeon: Eartha Angelia Sieving, MD;  [facility];  Service: Gastroenterology;  Laterality: N/A;   ESOPHAGOGASTRODUODENOSCOPY (EGD) WITH PROPOFOL  N/A 04/04/2022   Procedure: ESOPHAGOGASTRODUODENOSCOPY (EGD) WITH PROPOFOL ;  Surgeon: Leigh Elspeth SQUIBB, MD;  Location[facility] ENDOSCOPY;  Service:  Gastroenterology;  Laterality: N/A;   ESOPHAGOGASTRODUODENOSCOPY (EGD) WITH PROPOFOL   06/30/2022   Procedure: ESOPHAGOGASTRODUODENOSCOPY (EGD) WITH PROPOFOL ;  Surgeon: Eartha Angelia Sieving, MD;  [facility];  Service: Gastroenterology;;   GIVENS CAPSULE STUDY N/A 05/11/2022   Procedure: GIVENS CAPSULE STUDY;  Surgeon: Eartha Angelia Sieving, MD;  [facility];  Service: Gastroenterology;  Laterality: N/A;  7:30 am   HEMORRHOID SURGERY     HOT HEMOSTASIS  06/30/2022   Procedure: HOT HEMOSTASIS (ARGON PLASMA COAGULATION/BICAP);  Surgeon: Eartha Angelia, Sieving, MD;  [facility];  Service: Gastroenterology;;   POLYPECTOMY  11/09/2019   Procedure: POLYPECTOMY;  Surgeon: Eartha Angelia, Sieving, MD;  [facility];  Service: Gastroenterology;;   POLYPECTOMY  02/27/2021   Procedure: POLYPECTOMY;  Surgeon: Eartha Angelia, Sieving, MD;  [facility];  Service: Gastroenterology;;   POLYPECTOMY  04/05/2022   Procedure: POLYPECTOMY;  Surgeon: Shila Gustav GAILS, MD;  [facility];  Service: Gastroenterology;;   TONSILLECTOMY     Current Outpatient Medications  Medication Instructions   ACCU-CHEK AVIVA PLUS test strip USE AS INSTRUCTED   albuterol  (VENTOLIN  HFA) 108 (90 Base) MCG/ACT inhaler 2 puffs, Inhalation, Every 6 hours PRN   amoxicillin -clavulanate (AUGMENTIN ) 875-125 MG tablet 1 tablet, Oral, Every 12 hours   apixaban  (ELIQUIS ) 5 mg, Oral, 2 times daily   atorvastatin  (LIPITOR ) 80 mg, Oral, Daily   Calcium -Magnesium -Zinc (CAL-MAG-ZINC PO) 1 tablet, Daily   ferrous sulfate  325 mg, Oral, Daily with breakfast   folic acid  (FOLVITE ) 1 mg, Oral, Daily   furosemide  (LASIX ) 40 mg, Oral, As needed   gabapentin  (NEURONTIN ) 300 MG capsule TAKE 1 TO 2 CAPSULES BY MOUTH  TWICE DAILY   Jardiance  10 mg, Oral, Daily before breakfast   lisinopril  (ZESTRIL ) 20 mg, Oral, Daily   magnesium  oxide (MAG-OX) 400 mg, Daily at bedtime    meloxicam  (MOBIC ) 7.5 mg, Oral, Daily   metFORMIN  (GLUCOPHAGE ) 1,000 mg, Oral, 2 times daily, TAKE 2 TABLETS BY MOUTH TWICE  DAILY   metoprolol  succinate (TOPROL -XL) 50 mg, Oral, Daily   Multiple Vitamin (MULTIVITAMIN WITH MINERALS) TABS 1 tablet, Daily   nitroGLYCERIN  (NITROSTAT ) 0.4 mg, Sublingual, Every 5 min x3 PRN   oxyCODONE -acetaminophen  (PERCOCET) 5-325 MG tablet 1 tablet, Oral, Every 8 hours PRN   pantoprazole  (PROTONIX ) 40 mg, Oral, 2 times daily   promethazine -dextromethorphan (PROMETHAZINE -DM) 6.25-15 MG/5ML syrup 5 mLs, Oral, 4 times daily PRN    Physical Exam Vitals and nursing note reviewed.  Constitutional:      Appearance: Normal appearance.  HENT:     Head: Normocephalic and atraumatic.   Eyes:     General: No scleral icterus.       Right eye: No discharge.        Left eye: No discharge.     Extraocular Movements: Extraocular movements intact.     Conjunctiva/sclera: Conjunctivae normal.     Pupils: Pupils are equal, round, and reactive to light.    Cardiovascular:     Rate and Rhythm: Normal rate.     Pulses: Normal pulses.   Skin:    General: Skin is warm and dry.  Capillary Refill: Capillary refill takes less than 2 seconds.   Neurological:     General: No focal deficit present.     Mental Status: He is alert and oriented to person, place, and time.     Gait: Gait normal.   Psychiatric:        Mood and Affect: Mood normal.        Behavior: Behavior normal.        Thought Content: Thought content normal.        Judgment: Judgment normal.    Left shoulder is tender on the posterolateral aspect of the joint line no tenderness anteriorly he has tenderness over the [facility] joint but no prominence has painful range of motion in all planes of his left shoulder and I was unable to abduct or flex the arm much past 10 degrees  We cannot test for instability drop test or actual cuff strength in the empty can position secondary to pain  No bruising or swelling  noted around the joint or skin  Imaging  DG Shoulder Left Result Date: 09/08/2023 Acute pain left shoulder lifted heavy object X-ray shows normal glenohumeral joint Curved acromion probably type III Small probable osteophytes inferior glenoid and humerus with normal humeral head no evidence of fracture Overall impression no acute fracture left shoulder mild degenerative changes throughout   Assessment and plan  Encounter Diagnoses  Name Primary?   Acute pain of left shoulder Yes   Injury of left rotator cuff, initial encounter     Rule out rotator cuff tear recommend MRI continue sling  Medication for pain  Meds ordered this encounter  Medications   oxyCODONE -acetaminophen  (PERCOCET) 5-325 MG tablet    Sig: Take 1 tablet by mouth every 8 (eight) hours as needed for up to 7 days for severe pain (pain score 7-10).    Dispense:  15 tablet    Refill:  0

## 2023-09-08 NOTE — Progress Notes (Signed)
  Intake history:  There were no vitals taken for this visit. There is no height or weight on file to calculate BMI.    WHAT ARE WE SEEING YOU FOR TODAY?   left shoulder  How long has this bothered you? (DOI?DOS?WS?)  on 09/03/23  Anticoag.  Yes  Diabetes Yes  Heart disease Yes  Hypertension Yes  SMOKING HX Yes  Kidney disease No  Any ALLERGIES ______________________________________________   Treatment:  Have you taken:  Tylenol  No  Advil  Yes  Had PT No  Had injection No  Other  _Icing that helps a little bit_______

## 2023-09-08 NOTE — Patient Instructions (Signed)
 Your insurance doesn't require approval for MRI please go ahead and call to schedule your appointment with [facility] within at least one (1) week.   Central Scheduling [phone]

## 2023-09-09 ENCOUNTER — Other Ambulatory Visit: Payer: Self-pay

## 2023-09-09 ENCOUNTER — Encounter: Payer: Self-pay | Admitting: Cardiology

## 2023-09-09 DIAGNOSIS — I2510 Atherosclerotic heart disease of native coronary artery without angina pectoris: Secondary | ICD-10-CM

## 2023-09-09 MED ORDER — NITROGLYCERIN 0.4 MG SL SUBL
0.4000 mg | SUBLINGUAL_TABLET | SUBLINGUAL | 3 refills | Status: AC | PRN
Start: 2023-09-09 — End: ?

## 2023-09-14 ENCOUNTER — Ambulatory Visit (HOSPITAL_COMMUNITY)
Admission: RE | Admit: 2023-09-14 | Discharge: 2023-09-14 | Disposition: A | Discharge status: Home / Self Care | Source: Ambulatory Visit | Attending: Orthopedic Surgery | Admitting: Orthopedic Surgery

## 2023-09-14 DIAGNOSIS — M25512 Pain in left shoulder: Secondary | ICD-10-CM | POA: Insufficient documentation

## 2023-09-20 ENCOUNTER — Other Ambulatory Visit: Payer: Self-pay | Admitting: Student

## 2023-09-20 ENCOUNTER — Ambulatory Visit (HOSPITAL_COMMUNITY)
Admission: RE | Admit: 2023-09-20 | Discharge: 2023-09-20 | Disposition: A | Discharge status: Home / Self Care | Source: Ambulatory Visit | Attending: Cardiology | Admitting: Cardiology

## 2023-09-20 DIAGNOSIS — I480 Paroxysmal atrial fibrillation: Secondary | ICD-10-CM | POA: Diagnosis present

## 2023-09-20 DIAGNOSIS — I2510 Atherosclerotic heart disease of native coronary artery without angina pectoris: Secondary | ICD-10-CM | POA: Insufficient documentation

## 2023-09-20 DIAGNOSIS — I350 Nonrheumatic aortic (valve) stenosis: Secondary | ICD-10-CM | POA: Diagnosis present

## 2023-09-20 LAB — EXERCISE TOLERANCE TEST
Angina Index: 0
Estimated workload: 4.6
Exercise duration (min): 1 min
Exercise duration (sec): 48 s
MPHR: 148 {beats}/min
Peak HR: 125 {beats}/min
Percent HR: 84 %
RPE: 14
Rest HR: 72 {beats}/min

## 2023-09-20 NOTE — Progress Notes (Signed)
     Jonathan Gilmore presented for a GXT. I Laymon CHRISTELLA Qua, PA-C, provided direct supervision and was present during the stress portion of the study today, which was completed without significant symptoms, immediate complications, or acute ST/T changes on ECG.  Laymon CHRISTELLA Qua, PA-C  09/20/2023, 11:56 AM

## 2023-09-22 ENCOUNTER — Other Ambulatory Visit: Payer: Medicare Other

## 2023-09-22 DIAGNOSIS — C61 Malignant neoplasm of prostate: Secondary | ICD-10-CM

## 2023-09-23 LAB — PSA: Prostate Specific Ag, Serum: 3 ng/mL (ref 0.0–4.0)

## 2023-09-26 ENCOUNTER — Encounter: Payer: Self-pay | Admitting: Urology

## 2023-09-26 ENCOUNTER — Encounter: Payer: Self-pay | Admitting: Orthopedic Surgery

## 2023-09-26 ENCOUNTER — Ambulatory Visit: Admitting: Orthopedic Surgery

## 2023-09-26 ENCOUNTER — Ambulatory Visit: Payer: Medicare Other | Admitting: Urology

## 2023-09-26 VITALS — BP 119/73 | HR 86

## 2023-09-26 DIAGNOSIS — M12812 Other specific arthropathies, not elsewhere classified, left shoulder: Secondary | ICD-10-CM | POA: Diagnosis not present

## 2023-09-26 DIAGNOSIS — M75102 Unspecified rotator cuff tear or rupture of left shoulder, not specified as traumatic: Secondary | ICD-10-CM | POA: Diagnosis not present

## 2023-09-26 DIAGNOSIS — M25512 Pain in left shoulder: Secondary | ICD-10-CM

## 2023-09-26 DIAGNOSIS — S46002A Unspecified injury of muscle(s) and tendon(s) of the rotator cuff of left shoulder, initial encounter: Secondary | ICD-10-CM | POA: Diagnosis not present

## 2023-09-26 DIAGNOSIS — N5201 Erectile dysfunction due to arterial insufficiency: Secondary | ICD-10-CM

## 2023-09-26 DIAGNOSIS — C61 Malignant neoplasm of prostate: Secondary | ICD-10-CM | POA: Diagnosis not present

## 2023-09-26 DIAGNOSIS — R351 Nocturia: Secondary | ICD-10-CM | POA: Diagnosis not present

## 2023-09-26 NOTE — Patient Instructions (Signed)
 Physical therapy has been ordered for you at [facility]. They should call you to schedule, [phone] is the phone number to call, if you want to call to schedule.

## 2023-09-26 NOTE — Patient Instructions (Signed)

## 2023-09-26 NOTE — Progress Notes (Signed)
 09/26/2023 3:02 PM   Jonathan Gilmore 1950/06/24 [id number]  Referring provider: Levora Reyes SAUNDERS, MD [phone] [address] [place],  [place] [address]  Followup prostate cancer   HPI: Jonathan Gilmore is a 72yo here for followup for prostate cancer, BPH with nocturia and erectile dysfunction. IPSS 13 QOl 2 on no BPH therapy. Nocturia 1-2x depending on fluid consumption. NO straining to urinate.    PMH: Past Medical History:  Diagnosis Date   Abnormal cardiovascular stress test 03/2013   Anxiety    Arthritis    Back pain    Cancer (HCC)    Prostate cancer-no treatment per pt   CHF (congestive heart failure) (HCC)    Coronary artery disease    Multivessel with significant LAD involvement by chest CT 2014   Depression    Diastolic dysfunction    Essential hypertension    Lumbar herniated disc    L4-L5   MRSA (methicillin resistant staph aureus) culture positive    Myocardial infarction (HCC)    Neuropathy    Shortness of breath dyspnea    Sleep apnea    No cpap   Type 2 diabetes mellitus [facility])     Surgical History: Past Surgical History:  Procedure Laterality Date   ANTERIOR CERVICAL DECOMP/DISCECTOMY FUSION  01/18/2011   Procedure: ANTERIOR CERVICAL DECOMPRESSION/DISCECTOMY FUSION 2 LEVELS;  Surgeon: Victory LABOR Pool;  [facility];  Service: Neurosurgery;  Laterality: N/A;  anterior cervical discectomy and fusion with allograft and plating, cervical five-six, cervical six-seven   BIOPSY N/A 04/04/2015   Procedure: BIOPSY;  Surgeon: Claudis RAYMOND Rivet, MD;  [facility];  Service: Endoscopy;  Laterality: N/A;   BIOPSY  11/09/2019   Procedure: BIOPSY;  Surgeon: Eartha Angelia Sieving, MD;  [facility];  Service: Gastroenterology;;   Bone spur removed  1997   Left shoulder   CARDIAC CATHETERIZATION N/A 08/08/2015   Procedure: Right/Left Heart Cath and Coronary Angiography;  Surgeon: Ozell Fell, MD;  Location: [facility] INVASIVE CV LAB;  Service:  Cardiovascular;  Laterality: N/A;   CARPAL TUNNEL RELEASE Right    COLONOSCOPY WITH PROPOFOL  N/A 11/09/2019   Procedure: COLONOSCOPY WITH PROPOFOL ;  Surgeon: Eartha Angelia Sieving, MD;  [facility];  Service: Gastroenterology;  Laterality: N/A;  900   COLONOSCOPY WITH PROPOFOL  N/A 02/27/2021   Procedure: COLONOSCOPY WITH PROPOFOL ;  Surgeon: Eartha Angelia Sieving, MD;  [facility];  Service: Gastroenterology;  Laterality: N/A;  7:30   COLONOSCOPY WITH PROPOFOL  N/A 04/05/2022   Procedure: COLONOSCOPY WITH PROPOFOL ;  Surgeon: Shila Gustav GAILS, MD;  [facility];  Service: Gastroenterology;  Laterality: N/A;   ENTEROSCOPY N/A 06/30/2022   Procedure: ENTEROSCOPY;  Surgeon: Eartha Angelia Sieving, MD;  [facility];  Service: Gastroenterology;  Laterality: N/A;  2:00pm;ASA 3   ESOPHAGEAL DILATION N/A 04/04/2015   Procedure: ESOPHAGEAL DILATION;  Surgeon: Claudis RAYMOND Rivet, MD;  [facility];  Service: Endoscopy;  Laterality: N/A;   ESOPHAGEAL DILATION N/A 11/09/2019   Procedure: ESOPHAGEAL DILATION;  Surgeon: Eartha Angelia Sieving, MD;  [facility];  Service: Gastroenterology;  Laterality: N/A;   ESOPHAGOGASTRODUODENOSCOPY N/A 04/04/2015   Procedure: ESOPHAGOGASTRODUODENOSCOPY (EGD);  Surgeon: Claudis RAYMOND Rivet, MD;  [facility];  Service: Endoscopy;  Laterality: N/A;  1240   ESOPHAGOGASTRODUODENOSCOPY (EGD) WITH PROPOFOL  N/A 11/09/2019   Procedure: ESOPHAGOGASTRODUODENOSCOPY (EGD) WITH PROPOFOL ;  Surgeon: Eartha Angelia Sieving, MD;  [facility];  Service: Gastroenterology;  Laterality: N/A;   ESOPHAGOGASTRODUODENOSCOPY (EGD) WITH PROPOFOL  N/A 04/04/2022   Procedure: ESOPHAGOGASTRODUODENOSCOPY (EGD) WITH PROPOFOL ;  Surgeon: Leigh Elspeth SQUIBB, MD;  Location: [facility] ENDOSCOPY;  Service: Gastroenterology;  Laterality: N/A;   ESOPHAGOGASTRODUODENOSCOPY (EGD) WITH PROPOFOL   06/30/2022   Procedure:  ESOPHAGOGASTRODUODENOSCOPY (EGD) WITH PROPOFOL ;  Surgeon: Eartha Angelia Sieving, MD;  [facility];  Service: Gastroenterology;;   GIVENS CAPSULE STUDY N/A 05/11/2022   Procedure: GIVENS CAPSULE STUDY;  Surgeon: Eartha Angelia Sieving, MD;  [facility];  Service: Gastroenterology;  Laterality: N/A;  7:30 am   HEMORRHOID SURGERY     HOT HEMOSTASIS  06/30/2022   Procedure: HOT HEMOSTASIS (ARGON PLASMA COAGULATION/BICAP);  Surgeon: Eartha Angelia, Sieving, MD;  [facility];  Service: Gastroenterology;;   POLYPECTOMY  11/09/2019   Procedure: POLYPECTOMY;  Surgeon: Eartha Angelia Sieving, MD;  [facility];  Service: Gastroenterology;;   POLYPECTOMY  02/27/2021   Procedure: POLYPECTOMY;  Surgeon: Eartha Angelia Sieving, MD;  [facility];  Service: Gastroenterology;;   POLYPECTOMY  04/05/2022   Procedure: POLYPECTOMY;  Surgeon: Shila Gustav GAILS, MD;  Location: [facility] ENDOSCOPY;  Service: Gastroenterology;;   TONSILLECTOMY      Home Medications:  Allergies as of 09/26/2023       Reactions   Morphine And Codeine Nausea And Vomiting        Medication List        Accurate as of September 26, 2023  3:02 PM. If you have any questions, ask your nurse or doctor.          Accu-Chek Aviva Plus test strip Generic drug: glucose blood USE AS INSTRUCTED   albuterol  108 (90 Base) MCG/ACT inhaler Commonly known as: VENTOLIN  HFA Inhale 2 puffs into the lungs every 6 (six) hours as needed.   amoxicillin -clavulanate 875-125 MG tablet Commonly known as: AUGMENTIN  Take 1 tablet by mouth every 12 (twelve) hours.   apixaban  5 MG Tabs tablet Commonly known as: Eliquis  Take 1 tablet (5 mg total) by mouth 2 (two) times daily.   atorvastatin  80 MG tablet Commonly known as: LIPITOR  Take 1 tablet (80 mg total) by mouth daily.   CAL-MAG-ZINC PO Take 1 tablet by mouth daily.   ferrous sulfate  325 (65 FE) MG tablet Take 1 tablet (325  mg total) by mouth daily with breakfast.   folic acid  1 MG tablet Commonly known as: FOLVITE  Take 1 tablet (1 mg total) by mouth daily.   furosemide  40 MG tablet Commonly known as: LASIX  Take 1 tablet (40 mg total) by mouth as needed for edema (swelling).   gabapentin  300 MG capsule Commonly known as: NEURONTIN  TAKE 1 TO 2 CAPSULES BY MOUTH  TWICE DAILY   Jardiance  10 MG Tabs tablet Generic drug: empagliflozin  TAKE 1 TABLET BY MOUTH DAILY  BEFORE BREAKFAST   lisinopril  20 MG tablet Commonly known as: ZESTRIL  Take 1 tablet (20 mg total) by mouth daily.   magnesium  oxide 400 MG tablet Commonly known as: MAG-OX Take 400 mg by mouth at bedtime.   meloxicam  7.5 MG tablet Commonly known as: Mobic  Take 1 tablet (7.5 mg total) by mouth daily.   metFORMIN  500 MG tablet Commonly known as: GLUCOPHAGE  Take 2 tablets (1,000 mg total) by mouth 2 (two) times daily. TAKE 2 TABLETS BY MOUTH TWICE  DAILY   metoprolol  succinate 50 MG 24 hr tablet Commonly known as: TOPROL -XL TAKE 1 TABLET BY MOUTH DAILY   multivitamin with minerals Tabs tablet Take 1 tablet by mouth  daily.   nitroGLYCERIN  0.4 MG SL tablet Commonly known as: NITROSTAT  Place 1 tablet (0.4 mg total) under the tongue every 5 (five) minutes x 3 doses as needed for chest pain (if no relief after 2nd dose, proceed to ED for an evaluation).   pantoprazole  40 MG tablet Commonly known as: PROTONIX  Take 1 tablet (40 mg total) by mouth 2 (two) times daily.   promethazine -dextromethorphan 6.25-15 MG/5ML syrup Commonly known as: PROMETHAZINE -DM Take 5 mLs by mouth 4 (four) times daily as needed.        Allergies:  Allergies  Allergen Reactions   Morphine And Codeine Nausea And Vomiting    Family History: Family History  Problem Relation Age of Onset   Lung cancer Mother    Diabetes Father    Hepatitis B Father    Diabetes Sister    Cushing syndrome Sister     Social History:  reports that he has been smoking  cigarettes. He started smoking about 55 years ago. He has a 55.8 pack-year smoking history. He has quit using smokeless tobacco. He reports that he does not currently use alcohol. He reports current drug use. Frequency: 5.00 times per week. Drug: Marijuana.  ROS: All other review of systems were reviewed and are negative except what is noted above in HPI  Physical Exam: BP 119/73   Pulse 86   Constitutional:  Alert and oriented, No acute distress. HEENT: [place] AT, moist mucus membranes.  Trachea midline, no masses. Cardiovascular: No clubbing, cyanosis, or edema. Respiratory: Normal respiratory effort, no increased work of breathing. GI: Abdomen is soft, nontender, nondistended, no abdominal masses GU: No CVA tenderness.  Lymph: No cervical or inguinal lymphadenopathy. Skin: No rashes, bruises or suspicious lesions. Neurologic: Grossly intact, no focal deficits, moving all 4 extremities. Psychiatric: Normal mood and affect.  Laboratory Data: Lab Results  Component Value Date   WBC 9.3 07/11/2023   HGB 15.9 07/11/2023   HCT 48.4 07/11/2023   MCV 94.5 07/11/2023   PLT 235 07/11/2023    Lab Results  Component Value Date   CREATININE 0.75 07/11/2023    Lab Results  Component Value Date   PSA 3.85 12/01/2020   PSA 2.1 02/26/2019    Lab Results  Component Value Date   TESTOSTERONE  241.06 (L) 12/01/2020    Lab Results  Component Value Date   HGBA1C 8.4 (H) 08/19/2022    Urinalysis    Component Value Date/Time   COLORURINE STRAW (A) 01/16/2018 0938   APPEARANCEUR Clear 03/28/2023 1604   LABSPEC 1.008 01/16/2018 0938   PHURINE 7.0 01/16/2018 0938   GLUCOSEU Negative 03/28/2023 1604   HGBUR NEGATIVE 01/16/2018 0938   BILIRUBINUR Negative 03/28/2023 1604   KETONESUR NEGATIVE 01/16/2018 0938   PROTEINUR Negative 03/28/2023 1604   PROTEINUR NEGATIVE 01/16/2018 0938   UROBILINOGEN 0.2 08/23/2011 0807   NITRITE Negative 03/28/2023 1604   NITRITE NEGATIVE 01/16/2018 0938    LEUKOCYTESUR Trace (A) 03/28/2023 1604    Lab Results  Component Value Date   LABMICR See below: 03/28/2023   WBCUA 0-5 03/28/2023   LABEPIT 0-10 03/28/2023   BACTERIA None seen 03/28/2023    Pertinent Imaging:  No results found for this or any previous visit.  No results found for this or any previous visit.  No results found for this or any previous visit.  No results found for this or any previous visit.  No results found for this or any previous visit.  No results found for this or any previous  visit.  No results found for this or any previous visit.  No results found for this or any previous visit.   Assessment & Plan:    1. Prostate cancer (HCC) (Primary) -followup 6 months with PSA - Urinalysis, Routine w reflex microscopic  2. Nocturia -decrease fluid intake within 2 hours of going to bed     No follow-ups on file.  Belvie Clara, MD  [facility] [place]

## 2023-09-26 NOTE — Progress Notes (Signed)
   There were no vitals taken for this visit.  There is no height or weight on file to calculate BMI.  Chief Complaint  Patient presents with   Results    Review MRI left shoulder     No diagnosis found.  DOI/DOS/ Date: 09/03/23  Unchanged

## 2023-09-26 NOTE — Progress Notes (Signed)
 Returns for follow-up for MRI left shoulder  I reviewed the MRI with Jonathan Gilmore and his wife.  He has noted improvements in terms of pain he is no longer wearing his sling.  He can lift the arm up to about 90 degrees of abduction and 110 of flexion although he still has weakness  His MRI in my opinion shows the following  1.  He has a full-thickness tear of his rotator cuff at the supraspinatus tendon and then a small infraspinatus tendon partial tear.    2. He has a large bulky AC joint with arthritis  3.  He has fatty infiltration of the muscle of the supraspinatus tendon probably 50%  4.  He has a large humeral head cyst just posterior to the supraspinatus tendon insertion  5.  Biceps tendon is also torn   Report included for completeness  FINDINGS: There is at least a near full-thickness tear to the supraspinatus tendon with diffuse thinning and ill-defined fibers at the insertion. Most fibers slightly retracted. There is likely a full-thickness component. Mild bursal fluid. No significant muscle belly atrophy. There is mild infraspinatus insertional tendinopathy. The teres minor tendon is intact. The subscapularis shows mild insertional tendinopathy and mild underlying impingement change to the lesser tuberosity. More moderate underlying impingement change and a prominent subchondral cysts deep to the infraspinatus.   No labral tear. There is at least a partial tear and probably complete tear of the intra-articular biceps tendon with retracted/delaminated fibers into the proximal bicipital groove. Moderate acromioclavicular osteoarthritis with moderate degenerative edema.   IMPRESSION: There is at least a near full-thickness tear and likely complete tear to the supraspinatus insertion. No significant muscle belly atrophy. Slight retraction of fibers.   Suspected complete tear of the intra-articular biceps tendon with delamination of fibers into the proximal bicipital  groove.   Insertional tendinopathy of the infraspinatus tendon and moderate underlying impingement change to the humeral head.   Moderate acromioclavicular osteoarthritis.     Electronically signed by: Reyes Frees MD 09/14/2023 02:50 PM EDT RP Workstation: [id number]   After presenting the data we made a decision together to try physical therapy and follow-up in about 2 months to see if he is any better  I think he is a high risk for surgical failure  1.  He is over 50  2.  He has diabetes  3.  There is fatty infiltration of the muscle  4.  He is on Eliquis  for heart arrhythmia   He agrees and thinks that operative treatment is better choice at this time

## 2023-09-27 ENCOUNTER — Ambulatory Visit: Payer: Self-pay | Admitting: Urology

## 2023-09-27 LAB — MICROSCOPIC EXAMINATION
Bacteria, UA: NONE SEEN
WBC, UA: NONE SEEN /HPF (ref 0–5)

## 2023-09-27 LAB — URINALYSIS, ROUTINE W REFLEX MICROSCOPIC
Bilirubin, UA: NEGATIVE
Glucose, UA: NEGATIVE
Ketones, UA: NEGATIVE
Leukocytes,UA: NEGATIVE
Nitrite, UA: NEGATIVE
Protein,UA: NEGATIVE
Specific Gravity, UA: 1.015 (ref 1.005–1.030)
Urobilinogen, Ur: 2 mg/dL — ABNORMAL HIGH (ref 0.2–1.0)
pH, UA: 6.5 (ref 5.0–7.5)

## 2023-10-03 ENCOUNTER — Encounter (HOSPITAL_COMMUNITY): Payer: Self-pay | Admitting: Occupational Therapy

## 2023-10-03 ENCOUNTER — Ambulatory Visit (HOSPITAL_COMMUNITY): Attending: Orthopedic Surgery | Admitting: Occupational Therapy

## 2023-10-03 DIAGNOSIS — M12812 Other specific arthropathies, not elsewhere classified, left shoulder: Secondary | ICD-10-CM | POA: Diagnosis not present

## 2023-10-03 DIAGNOSIS — R29898 Other symptoms and signs involving the musculoskeletal system: Secondary | ICD-10-CM | POA: Diagnosis present

## 2023-10-03 DIAGNOSIS — M25512 Pain in left shoulder: Secondary | ICD-10-CM | POA: Diagnosis present

## 2023-10-03 DIAGNOSIS — M75102 Unspecified rotator cuff tear or rupture of left shoulder, not specified as traumatic: Secondary | ICD-10-CM | POA: Diagnosis not present

## 2023-10-03 DIAGNOSIS — S46002A Unspecified injury of muscle(s) and tendon(s) of the rotator cuff of left shoulder, initial encounter: Secondary | ICD-10-CM | POA: Insufficient documentation

## 2023-10-03 DIAGNOSIS — M25612 Stiffness of left shoulder, not elsewhere classified: Secondary | ICD-10-CM | POA: Insufficient documentation

## 2023-10-03 NOTE — Therapy (Unsigned)
 OUTPATIENT OCCUPATIONAL THERAPY ORTHO EVALUATION  Patient Name: Jonathan Gilmore MRN: [id number] DOB:01-Jan-1951, 73 y.o., male Today's Date: 10/04/2023   END OF SESSION:  OT End of Session - 10/03/23 1625     Visit Number 1    Number of Visits 8    Date for OT Re-Evaluation 11/04/23    Authorization Type [employer] Medicare    Authorization Time Period Requesting 8 visits    Authorization - Visit Number 1    Authorization - Number of Visits 8    OT Start Time 1533    OT Stop Time 1625    OT Time Calculation (min) 52 min    Activity Tolerance Patient tolerated treatment well    Behavior During Therapy [facility] for tasks assessed/performed          Past Medical History:  Diagnosis Date   Abnormal cardiovascular stress test 03/2013   Anxiety    Arthritis    Back pain    Cancer (HCC)    Prostate cancer-no treatment per pt   CHF (congestive heart failure) (HCC)    Coronary artery disease    Multivessel with significant LAD involvement by chest CT 2014   Depression    Diastolic dysfunction    Essential hypertension    Lumbar herniated disc    L4-L5   MRSA (methicillin resistant staph aureus) culture positive    Myocardial infarction (HCC)    Neuropathy    Shortness of breath dyspnea    Sleep apnea    No cpap   Type 2 diabetes mellitus (HCC)    Past Surgical History:  Procedure Laterality Date   ANTERIOR CERVICAL DECOMP/DISCECTOMY FUSION  01/18/2011   Procedure: ANTERIOR CERVICAL DECOMPRESSION/DISCECTOMY FUSION 2 LEVELS;  Surgeon: Victory LABOR Pool;  Location: [facility];  Service: Neurosurgery;  Laterality: N/A;  anterior cervical discectomy and fusion with allograft and plating, cervical five-six, cervical six-seven   BIOPSY N/A 04/04/2015   Procedure: BIOPSY;  Surgeon: Claudis RAYMOND Rivet, MD;  Location: [facility];  Service: Endoscopy;  Laterality: N/A;   BIOPSY  11/09/2019   Procedure: BIOPSY;  Surgeon: Eartha Angelia Sieving, MD;  Location: [facility];  Service:  Gastroenterology;;   Bone spur removed  1997   Left shoulder   CARDIAC CATHETERIZATION N/A 08/08/2015   Procedure: Right/Left Heart Cath and Coronary Angiography;  Surgeon: Ozell Fell, MD;  Location: [facility] INVASIVE CV LAB;  Service: Cardiovascular;  Laterality: N/A;   CARPAL TUNNEL RELEASE Right    COLONOSCOPY WITH PROPOFOL  N/A 11/09/2019   Procedure: COLONOSCOPY WITH PROPOFOL ;  Surgeon: Eartha Angelia Sieving, MD;  Location: [facility];  Service: Gastroenterology;  Laterality: N/A;  900   COLONOSCOPY WITH PROPOFOL  N/A 02/27/2021   Procedure: COLONOSCOPY WITH PROPOFOL ;  Surgeon: Eartha Angelia Sieving, MD;  Location: [facility];  Service: Gastroenterology;  Laterality: N/A;  7:30   COLONOSCOPY WITH PROPOFOL  N/A 04/05/2022   Procedure: COLONOSCOPY WITH PROPOFOL ;  Surgeon: Shila Gustav GAILS, MD;  Location: [facility] ENDOSCOPY;  Service: Gastroenterology;  Laterality: N/A;   ENTEROSCOPY N/A 06/30/2022   Procedure: ENTEROSCOPY;  Surgeon: Eartha Angelia Sieving, MD;  [facility]: [facility];  Service: Gastroenterology;  Laterality: N/A;  2:00pm;ASA 3   ESOPHAGEAL DILATION N/A 04/04/2015   Procedure: ESOPHAGEAL DILATION;  Surgeon: Claudis RAYMOND Rivet, MD;  [facility]: [facility];  Service: Endoscopy;  Laterality: N/A;   ESOPHAGEAL DILATION N/A 11/09/2019   Procedure: ESOPHAGEAL DILATION;  Surgeon: Eartha Angelia Sieving, MD;  [facility]: [facility];  Service: Gastroenterology;  Laterality: N/A;   ESOPHAGOGASTRODUODENOSCOPY N/A 04/04/2015   Procedure: ESOPHAGOGASTRODUODENOSCOPY (EGD);  Surgeon: Claudis RAYMOND Rivet, MD;  [facility];  Service: Endoscopy;  Laterality: N/A;  1240   ESOPHAGOGASTRODUODENOSCOPY (EGD) WITH PROPOFOL  N/A 11/09/2019   Procedure: ESOPHAGOGASTRODUODENOSCOPY (EGD) WITH PROPOFOL ;  Surgeon: Eartha Angelia Sieving, MD;  [facility];  Service: Gastroenterology;  Laterality: N/A;   ESOPHAGOGASTRODUODENOSCOPY (EGD) WITH PROPOFOL  N/A 04/04/2022   Procedure:  ESOPHAGOGASTRODUODENOSCOPY (EGD) WITH PROPOFOL ;  Surgeon: Leigh Elspeth SQUIBB, MD;  Location[facility] ENDOSCOPY;  Service: Gastroenterology;  Laterality: N/A;   ESOPHAGOGASTRODUODENOSCOPY (EGD) WITH PROPOFOL   06/30/2022   Procedure: ESOPHAGOGASTRODUODENOSCOPY (EGD) WITH PROPOFOL ;  Surgeon: Eartha Angelia Sieving, MD;  [facility];  Service: Gastroenterology;;   GIVENS CAPSULE STUDY N/A 05/11/2022   Procedure: GIVENS CAPSULE STUDY;  Surgeon: Eartha Angelia Sieving, MD;  [facility];  Service: Gastroenterology;  Laterality: N/A;  7:30 am   HEMORRHOID SURGERY     HOT HEMOSTASIS  06/30/2022   Procedure: HOT HEMOSTASIS (ARGON PLASMA COAGULATION/BICAP);  Surgeon: Eartha Angelia, Sieving, MD;  [facility]: [facility];  Service: Gastroenterology;;   POLYPECTOMY  11/09/2019   Procedure: POLYPECTOMY;  Surgeon: Eartha Angelia Sieving, MD;  [facility];  Service: Gastroenterology;;   POLYPECTOMY  02/27/2021   Procedure: POLYPECTOMY;  Surgeon: Eartha Angelia Sieving, MD;  [facility];  Service: Gastroenterology;;   POLYPECTOMY  04/05/2022   Procedure: POLYPECTOMY;  Surgeon: Shila Gustav GAILS, MD;  Location: [facility] ENDOSCOPY;  Service: Gastroenterology;;   TONSILLECTOMY     Patient Active Problem List   Diagnosis Date Noted   GERD (gastroesophageal reflux disease) 04/27/2023   Iron deficiency 04/08/2023   Belching 02/08/2023   Hx of fusion of cervical spine 11/22/2022   AVM (arteriovenous malformation) of small bowel, acquired 06/30/2022   Iron deficiency anemia due to chronic blood loss 04/22/2022   History of esophagitis 04/22/2022   Heme positive stool 04/04/2022   Iron deficiency anemia 04/04/2022   Acute blood loss anemia 04/02/2022   Prostate cancer (HCC) 06/08/2021   History of colonic polyps 02/09/2021   Intestinal metaplasia of antrum of stomach without dysplasia 02/09/2021   Insomnia 03/12/2019   Pure hypercholesterolemia 11/29/2018    Hypogonadism, male 11/23/2018   Essential hypertension, benign 11/13/2018   High risk medication use 10/28/2018   Carotid stenosis, asymptomatic, bilateral 10/28/2018   Tobacco use 10/28/2018   Polyp of colon 10/28/2018   Lumbar disc herniation with radiculopathy 12/21/2017   Spinal stenosis of lumbar region without neurogenic claudication 12/21/2017   CAD -50% LAD  08/09/2015   Moderate aortic stenosis 08/09/2015   Acute pulmonary edema (HCC) 08/07/2015   Acute diastolic CHF (congestive heart failure) (HCC) 08/07/2015   Hypersomnia with sleep apnea 09/20/2013   Syncope 09/19/2013   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   OSA (obstructive sleep apnea) -non compliant 09/19/2013   Narcolepsy and cataplexy 09/19/2013   Facial abscess 04/28/2013   Facial cellulitis 04/28/2013   Mixed hyperlipidemia 04/28/2013   Tobacco dependence 04/28/2013   Sepsis (HCC) 04/28/2013   Gout 04/28/2013   Diastolic CHF (HCC) 04/28/2013   Pleuritic pain 02/24/2013   Chest pain 02/24/2013   Murmur 02/24/2013   Pericarditis 02/24/2013   DM type 2 causing vascular disease (HCC) 02/24/2013   Essential hypertension 02/24/2013   Cervical spondylosis with myelopathy 01/19/2011    PCP: Levora Reyes SAUNDERS, MD REFERRING PROVIDER: Margrette Taft BRAVO, MD  ONSET DATE: 09/13/23  REFERRING DIAG: L rotator cuff full thickness tear, bicep  tear  THERAPY DIAG:  Stiffness of left shoulder, not elsewhere classified  Acute pain of left shoulder  Other symptoms and signs involving the musculoskeletal system  Rationale for Evaluation and Treatment: Rehabilitation  SUBJECTIVE:   SUBJECTIVE STATEMENT: I'm trying to ignore the pain. Pt accompanied by: self  PERTINENT HISTORY: Pt attempted to lift up an air conditioner unit with no assist and felt a loud pop. Pt referred to orthopedist who completed MRI, indicating full thickness rotator cuff tears, bicep tendon tear, and a cyst on the humeral head.    PRECAUTIONS: Shoulder  WEIGHT BEARING RESTRICTIONS: No  PAIN:  Are you having pain? Yes: NPRS scale: 8/10 Pain location: posterior shoulder girdle Pain description: sharp and aching Aggravating factors: Movement Relieving factors: Ice and ibuprofen   FALLS: Has patient fallen in last 6 months? No  PLOF: Independent  PATIENT GOALS: To improve strength  NEXT MD VISIT: 12/01/23  OBJECTIVE:   HAND DOMINANCE: Right  ADLs: Overall ADLs: Pt has severe limitations with dressing and bathing due to inability to reach up and back, as well as inability to lift and carry objects due to pain and weakness.   FUNCTIONAL OUTCOME MEASURES: Upper Extremity Functional Scale (UEFS): 38/80 = 47.5   Extreme difficulty/unable (0), Quite a bit of difficulty (1), Moderate difficulty (2), Little difficulty (3), No difficulty (4) Survey date:  10/03/23  Any of your usual work, household or school activities 2  2. Your usual hobbies, recreational/sport activities 1   3. Lifting a bag of groceries to waist level 3   4. Lifting a bag of groceries above your head 0  5. Grooming your hair 3  6. Pushing up on your hands (I.e. from bathtub or chair) 2  7. Preparing food (I.e. peeling/cutting) 4  8. Driving  2  9. Vacuuming, sweeping, or raking 1  10. Dressing  2  11. Doing up buttons 4  12. Using tools/appliances 0  13. Opening doors 2  14. Cleaning  3  15. Tying or lacing shoes 4  16. Sleeping  1  17. Laundering clothes (I.e. washing, ironing, folding) 3  18. Opening a jar 0  19. Throwing a ball 0  20. Carrying a small suitcase with your affected limb.  1  Score total:  38/80     UPPER EXTREMITY ROM:       Assessed in seated, er/IR adducted  Active ROM Left eval  Shoulder flexion 116  Shoulder abduction 162  Shoulder internal rotation 90  Shoulder external rotation 67  (Blank rows = not tested)    UPPER EXTREMITY MMT:     Assessed in seated, er/IR adducted  MMT Left eval   Shoulder flexion 3/5  Shoulder abduction 3+/5  Shoulder internal rotation 4/5* w/ pain  Shoulder external rotation 4/5* w/ pain  (Blank rows = not tested)  SENSATION: Constant to intermittent numbness and tingling  OBSERVATIONS: Pt wears sling intermittently throughout the day.    TODAY'S TREATMENT:  DATE:   10/03/23 -Table Slides: flexion, abduction, er, x10 -Pendulums: 2x30 -Wall Slides(climbs): flexion, x10   PATIENT EDUCATION: Education details: Table Slides, Wall climbs, Pendulums Person educated: Patient Education method: Explanation, Demonstration, and Handouts Education comprehension: verbalized understanding and returned demonstration  HOME EXERCISE PROGRAM: 7/21: Table Slides, Wall climbs, Pendulums  GOALS: Goals reviewed with patient? Yes   SHORT TERM GOALS: Target date: 11/07/23  Pt will be provided with and educated on HEP to improve mobility in LUE required for use during ADL completion.   Goal status: INITIAL  LONG TERM GOALS: Target date: 11/07/23  Pt will decrease pain in LUE to 3/10 or less to improve ability to sleep for 2+ consecutive hours without waking due to pain.   Goal status: INITIAL  2.  Pt will decrease LUE fascial restrictions to min amounts or less to improve mobility required for functional reaching tasks.   Goal status: INITIAL  3.  Pt will increase LUE A/ROM by 20 degrees to improve ability to use LUE when reaching overhead or behind back during dressing and bathing tasks.   Goal status: INITIAL  4.  Pt will increase LUE strength to 4+/5 or greater to improve ability to use LUE when lifting or carrying items during meal preparation/housework/yardwork tasks.   Goal status: INITIAL  5.  Pt will return to highest level of function using LUE as non-dominant during functional task completion.   Goal status:  INITIAL   ASSESSMENT:  CLINICAL IMPRESSION: Patient is a 73 y.o. male who was seen today for occupational therapy evaluation for L rotator cuff tears and bicep tears. Pt presents with increased pain and fascial restrictions, decreased ROM, strength, and functional use of the LUE.   PERFORMANCE DEFICITS: in functional skills including in functional skills including ADLs, IADLs, coordination, tone, ROM, strength, pain, fascial restrictions, muscle spasms, and UE functional use.  IMPAIRMENTS: are limiting patient from ADLs, IADLs, rest and sleep, work, leisure, and social participation.   COMORBIDITIES: may have co-morbidities  that affects occupational performance. Patient will benefit from skilled OT to address above impairments and improve overall function.  MODIFICATION OR ASSISTANCE TO COMPLETE EVALUATION: Min-Moderate modification of tasks or assist with assess necessary to complete an evaluation.  OT OCCUPATIONAL PROFILE AND HISTORY: Detailed assessment: Review of records and additional review of physical, cognitive, psychosocial history related to current functional performance.  CLINICAL DECISION MAKING: Moderate - several treatment options, min-mod task modification necessary  REHAB POTENTIAL: Good  EVALUATION COMPLEXITY: Moderate      PLAN:  OT FREQUENCY: 2x/week  OT DURATION: 4 weeks  PLANNED INTERVENTIONS: 97168 OT Re-evaluation, 97535 self care/ADL training, 02889 therapeutic exercise, 97530 therapeutic activity, 97112 neuromuscular re-education, 97140 manual therapy, 97035 ultrasound, 97010 moist heat, 97032 electrical stimulation (manual), passive range of motion, functional mobility training, energy conservation, coping strategies training, patient/family education, and DME and/or AE instructions  RECOMMENDED OTHER SERVICES: N/A  CONSULTED AND AGREED WITH PLAN OF CARE: Patient  PLAN FOR NEXT SESSION: Manual therapy, Isometrics, AA/ROM, A/ROM as  tolerated   Valentin Nightingale, OTR/L [facility] [phone] Valentin Jillyn Nightingale, OT 10/04/2023, 9:38 AM  [facility] Medicare Auth Request Information Treatment Start Date: 10/03/23  Date of referral: 09/26/23 Referring provider: Taft Minerva, MD Referring diagnosis (ICD 10)?  S46.002A (ICD-10-CM) - Injury of left rotator cuff, initial encounter  M75.102,M12.812 (ICD-10-CM) - Rotator cuff tear arthropathy of left shoulder   Treatment diagnosis (ICD 10)? (if different than referring diagnosis) M25.612, M25.512, R29.898  What was this (referring  dx) caused by? Unspecified  Nature of Condition: Initial Onset (within last 3 months)   Laterality: Lt  Current Functional Measure Score: Other UEFS: 38/80  Objective measurements identify impairments when they are compared to normal values, the uninvolved extremity, and prior level of function.  [x]  Yes  []  No  Objective assessment of functional ability: Severe functional limitations   Briefly describe symptoms: Severe pain, limited mobility, decreased independence with all IADL's and most ADL's.   How did symptoms start: Pt picked up an AC window unit and felt a loud pop.   Average pain intensity:  Last 24 hours: 9/10  Past week: 9/10  How often does the pt experience symptoms? Constantly  How much have the symptoms interfered with usual daily activities? Extremely  How has condition changed since care began at this facility? NA - initial visit  In general, how is the patients overall health? Good   BACK PAIN (STarT Back Screening Tool) No

## 2023-10-03 NOTE — Patient Instructions (Signed)
2x/week for 6 weeks.

## 2023-10-04 ENCOUNTER — Ambulatory Visit: Payer: Self-pay | Admitting: Cardiology

## 2023-10-11 ENCOUNTER — Encounter (HOSPITAL_COMMUNITY): Admitting: Occupational Therapy

## 2023-10-11 ENCOUNTER — Telehealth (HOSPITAL_COMMUNITY): Payer: Self-pay | Admitting: Occupational Therapy

## 2023-10-11 NOTE — Telephone Encounter (Signed)
 Called pt regarding no-show for appt at 930. No answer, left voicemail offering reschedule appointment for today and reminding pt of next scheduled appt on 7/31 at 930. Requested pt call if unable to attend.    Sonny Cory, OTR/L  [phone] 10/11/23

## 2023-10-13 ENCOUNTER — Encounter (HOSPITAL_COMMUNITY): Admitting: Occupational Therapy

## 2023-10-17 ENCOUNTER — Telehealth (HOSPITAL_COMMUNITY): Payer: Self-pay | Admitting: Occupational Therapy

## 2023-10-17 ENCOUNTER — Encounter (HOSPITAL_COMMUNITY): Admitting: Occupational Therapy

## 2023-10-17 NOTE — Telephone Encounter (Signed)
 This OT called and left a VM regarding pt's No Show on 10/17/23. Informed pt of the No Show Policy and reminded of next visit on 10/19/23.   Valentin Nightingale, OTR/L [facility] [phone]

## 2023-10-19 ENCOUNTER — Encounter (HOSPITAL_COMMUNITY): Admitting: Occupational Therapy

## 2023-10-19 ENCOUNTER — Telehealth (HOSPITAL_COMMUNITY): Payer: Self-pay | Admitting: Occupational Therapy

## 2023-10-19 NOTE — Telephone Encounter (Signed)
 This OT called pt due to no show on 10/19/23. Left a VM informing pt that all appointments will be canceled and pt will be discharged at this time due to this clinics No Show Policy.   Valentin Nightingale, OTR/L [facility] [phone]

## 2023-10-24 ENCOUNTER — Encounter (HOSPITAL_COMMUNITY): Admitting: Occupational Therapy

## 2023-10-24 DIAGNOSIS — I639 Cerebral infarction, unspecified: Secondary | ICD-10-CM

## 2023-10-24 HISTORY — DX: Cerebral infarction, unspecified: I63.9

## 2023-10-25 ENCOUNTER — Encounter: Payer: Self-pay | Admitting: *Deleted

## 2023-10-25 DIAGNOSIS — R0602 Shortness of breath: Secondary | ICD-10-CM

## 2023-10-26 ENCOUNTER — Encounter (HOSPITAL_COMMUNITY): Admitting: Occupational Therapy

## 2023-10-27 ENCOUNTER — Inpatient Hospital Stay (HOSPITAL_COMMUNITY)
Admission: EM | Admit: 2023-10-27 | Discharge: 2023-10-30 | DRG: 065 | Disposition: A | Discharge status: Home / Self Care | Attending: Internal Medicine | Admitting: Internal Medicine

## 2023-10-27 ENCOUNTER — Emergency Department (HOSPITAL_COMMUNITY)

## 2023-10-27 ENCOUNTER — Other Ambulatory Visit: Payer: Self-pay

## 2023-10-27 ENCOUNTER — Encounter (HOSPITAL_COMMUNITY): Payer: Self-pay

## 2023-10-27 ENCOUNTER — Other Ambulatory Visit: Payer: Self-pay | Admitting: *Deleted

## 2023-10-27 DIAGNOSIS — Z801 Family history of malignant neoplasm of trachea, bronchus and lung: Secondary | ICD-10-CM

## 2023-10-27 DIAGNOSIS — I6523 Occlusion and stenosis of bilateral carotid arteries: Secondary | ICD-10-CM | POA: Diagnosis present

## 2023-10-27 DIAGNOSIS — R29702 NIHSS score 2: Secondary | ICD-10-CM | POA: Diagnosis present

## 2023-10-27 DIAGNOSIS — E1140 Type 2 diabetes mellitus with diabetic neuropathy, unspecified: Secondary | ICD-10-CM | POA: Diagnosis present

## 2023-10-27 DIAGNOSIS — G936 Cerebral edema: Secondary | ICD-10-CM | POA: Diagnosis present

## 2023-10-27 DIAGNOSIS — Z7984 Long term (current) use of oral hypoglycemic drugs: Secondary | ICD-10-CM

## 2023-10-27 DIAGNOSIS — Z8546 Personal history of malignant neoplasm of prostate: Secondary | ICD-10-CM

## 2023-10-27 DIAGNOSIS — Z8614 Personal history of Methicillin resistant Staphylococcus aureus infection: Secondary | ICD-10-CM

## 2023-10-27 DIAGNOSIS — E611 Iron deficiency: Secondary | ICD-10-CM | POA: Diagnosis present

## 2023-10-27 DIAGNOSIS — E1169 Type 2 diabetes mellitus with other specified complication: Secondary | ICD-10-CM | POA: Diagnosis present

## 2023-10-27 DIAGNOSIS — E876 Hypokalemia: Secondary | ICD-10-CM | POA: Diagnosis present

## 2023-10-27 DIAGNOSIS — I63441 Cerebral infarction due to embolism of right cerebellar artery: Secondary | ICD-10-CM | POA: Diagnosis not present

## 2023-10-27 DIAGNOSIS — I10 Essential (primary) hypertension: Secondary | ICD-10-CM | POA: Diagnosis present

## 2023-10-27 DIAGNOSIS — Z716 Tobacco abuse counseling: Secondary | ICD-10-CM

## 2023-10-27 DIAGNOSIS — F17200 Nicotine dependence, unspecified, uncomplicated: Secondary | ICD-10-CM | POA: Diagnosis present

## 2023-10-27 DIAGNOSIS — I2510 Atherosclerotic heart disease of native coronary artery without angina pectoris: Secondary | ICD-10-CM | POA: Diagnosis present

## 2023-10-27 DIAGNOSIS — E785 Hyperlipidemia, unspecified: Secondary | ICD-10-CM | POA: Diagnosis present

## 2023-10-27 DIAGNOSIS — I639 Cerebral infarction, unspecified: Secondary | ICD-10-CM | POA: Diagnosis not present

## 2023-10-27 DIAGNOSIS — Z7901 Long term (current) use of anticoagulants: Secondary | ICD-10-CM

## 2023-10-27 DIAGNOSIS — I63541 Cerebral infarction due to unspecified occlusion or stenosis of right cerebellar artery: Principal | ICD-10-CM | POA: Diagnosis present

## 2023-10-27 DIAGNOSIS — R9082 White matter disease, unspecified: Secondary | ICD-10-CM | POA: Diagnosis present

## 2023-10-27 DIAGNOSIS — Z885 Allergy status to narcotic agent status: Secondary | ICD-10-CM

## 2023-10-27 DIAGNOSIS — I350 Nonrheumatic aortic (valve) stenosis: Secondary | ICD-10-CM

## 2023-10-27 DIAGNOSIS — Z791 Long term (current) use of non-steroidal anti-inflammatories (NSAID): Secondary | ICD-10-CM

## 2023-10-27 DIAGNOSIS — R29701 NIHSS score 1: Secondary | ICD-10-CM | POA: Diagnosis present

## 2023-10-27 DIAGNOSIS — I110 Hypertensive heart disease with heart failure: Secondary | ICD-10-CM | POA: Diagnosis present

## 2023-10-27 DIAGNOSIS — F419 Anxiety disorder, unspecified: Secondary | ICD-10-CM | POA: Diagnosis present

## 2023-10-27 DIAGNOSIS — I252 Old myocardial infarction: Secondary | ICD-10-CM

## 2023-10-27 DIAGNOSIS — K219 Gastro-esophageal reflux disease without esophagitis: Secondary | ICD-10-CM | POA: Diagnosis present

## 2023-10-27 DIAGNOSIS — I5032 Chronic diastolic (congestive) heart failure: Secondary | ICD-10-CM | POA: Diagnosis present

## 2023-10-27 DIAGNOSIS — Z833 Family history of diabetes mellitus: Secondary | ICD-10-CM

## 2023-10-27 DIAGNOSIS — Z79899 Other long term (current) drug therapy: Secondary | ICD-10-CM

## 2023-10-27 DIAGNOSIS — I480 Paroxysmal atrial fibrillation: Secondary | ICD-10-CM | POA: Diagnosis present

## 2023-10-27 DIAGNOSIS — Z91148 Patient's other noncompliance with medication regimen for other reason: Secondary | ICD-10-CM

## 2023-10-27 DIAGNOSIS — F32A Depression, unspecified: Secondary | ICD-10-CM | POA: Diagnosis present

## 2023-10-27 DIAGNOSIS — Q2111 Secundum atrial septal defect: Secondary | ICD-10-CM

## 2023-10-27 LAB — CBC WITH DIFFERENTIAL/PLATELET
Abs Immature Granulocytes: 0.05 K/uL (ref 0.00–0.07)
Basophils Absolute: 0.1 K/uL (ref 0.0–0.1)
Basophils Relative: 1 %
Eosinophils Absolute: 0.2 K/uL (ref 0.0–0.5)
Eosinophils Relative: 2 %
HCT: 42.9 % (ref 39.0–52.0)
Hemoglobin: 14.1 g/dL (ref 13.0–17.0)
Immature Granulocytes: 1 %
Lymphocytes Relative: 17 %
Lymphs Abs: 1.8 K/uL (ref 0.7–4.0)
MCH: 32.3 pg (ref 26.0–34.0)
MCHC: 32.9 g/dL (ref 30.0–36.0)
MCV: 98.4 fL (ref 80.0–100.0)
Monocytes Absolute: 0.8 K/uL (ref 0.1–1.0)
Monocytes Relative: 7 %
Neutro Abs: 7.6 K/uL (ref 1.7–7.7)
Neutrophils Relative %: 72 %
Platelets: 209 K/uL (ref 150–400)
RBC: 4.36 MIL/uL (ref 4.22–5.81)
RDW: 14.3 % (ref 11.5–15.5)
WBC: 10.4 K/uL (ref 4.0–10.5)
nRBC: 0 % (ref 0.0–0.2)

## 2023-10-27 LAB — COMPREHENSIVE METABOLIC PANEL WITH GFR
ALT: 16 U/L (ref 0–44)
AST: 26 U/L (ref 15–41)
Albumin: 3.8 g/dL (ref 3.5–5.0)
Alkaline Phosphatase: 109 U/L (ref 38–126)
Anion gap: 18 — ABNORMAL HIGH (ref 5–15)
BUN: 10 mg/dL (ref 8–23)
CO2: 22 mmol/L (ref 22–32)
Calcium: 8.9 mg/dL (ref 8.9–10.3)
Chloride: 100 mmol/L (ref 98–111)
Creatinine, Ser: 0.78 mg/dL (ref 0.61–1.24)
GFR, Estimated: >60 mL/min (ref >60)
Glucose, Bld: 178 mg/dL — ABNORMAL HIGH (ref 70–99)
Potassium: 3.1 mmol/L — ABNORMAL LOW (ref 3.5–5.1)
Sodium: 140 mmol/L (ref 135–145)
Total Bilirubin: 1.7 mg/dL — ABNORMAL HIGH (ref 0.0–1.2)
Total Protein: 7 g/dL (ref 6.5–8.1)

## 2023-10-27 LAB — CK: Total CK: 54 U/L (ref 49–397)

## 2023-10-27 LAB — GLUCOSE, CAPILLARY: Glucose-Capillary: 185 mg/dL — ABNORMAL HIGH (ref 70–99)

## 2023-10-27 LAB — TROPONIN I (HIGH SENSITIVITY)
Troponin I (High Sensitivity): 5 ng/L (ref <18)
Troponin I (High Sensitivity): 7 ng/L (ref <18)

## 2023-10-27 LAB — TSH: TSH: 1.615 u[IU]/mL (ref 0.350–4.500)

## 2023-10-27 LAB — MAGNESIUM: Magnesium: 1.5 mg/dL — ABNORMAL LOW (ref 1.7–2.4)

## 2023-10-27 MED ORDER — PANTOPRAZOLE SODIUM 40 MG PO TBEC
40.0000 mg | DELAYED_RELEASE_TABLET | Freq: Two times a day (BID) | ORAL | Status: DC
  Administered 2023-10-27 – 2023-10-30 (×6): 40 mg via ORAL
  Filled 2023-10-27 (×6): qty 1

## 2023-10-27 MED ORDER — SODIUM CHLORIDE 0.9 % IV BOLUS
500.0000 mL | Freq: Once | INTRAVENOUS | Status: AC
  Administered 2023-10-27: 500 mL via INTRAVENOUS

## 2023-10-27 MED ORDER — MECLIZINE HCL 12.5 MG PO TABS: 25.0000 mg | ORAL_TABLET | Freq: Three times a day (TID) | ORAL | Status: DC | PRN

## 2023-10-27 MED ORDER — MECLIZINE HCL 12.5 MG PO TABS
25.0000 mg | ORAL_TABLET | Freq: Once | ORAL | Status: AC
  Administered 2023-10-27: 25 mg via ORAL
  Filled 2023-10-27: qty 2

## 2023-10-27 MED ORDER — APIXABAN 5 MG PO TABS
5.0000 mg | ORAL_TABLET | Freq: Two times a day (BID) | ORAL | Status: DC
  Administered 2023-10-27 – 2023-10-30 (×6): 5 mg via ORAL
  Filled 2023-10-27 (×6): qty 1

## 2023-10-27 MED ORDER — METOPROLOL SUCCINATE ER 50 MG PO TB24
50.0000 mg | ORAL_TABLET | Freq: Every day | ORAL | Status: DC
  Administered 2023-10-28 – 2023-10-30 (×3): 50 mg via ORAL
  Filled 2023-10-27 (×3): qty 1

## 2023-10-27 MED ORDER — ACETAMINOPHEN 325 MG PO TABS: 650.0000 mg | ORAL_TABLET | ORAL | Status: DC | PRN

## 2023-10-27 MED ORDER — ACETAMINOPHEN 650 MG RE SUPP
650.0000 mg | RECTAL | Status: DC | PRN
Start: 2023-10-27 — End: 2023-10-30

## 2023-10-27 MED ORDER — ATORVASTATIN CALCIUM 40 MG PO TABS
80.0000 mg | ORAL_TABLET | Freq: Every day | ORAL | Status: DC
  Administered 2023-10-27 – 2023-10-29 (×3): 80 mg via ORAL
  Filled 2023-10-27 (×3): qty 2

## 2023-10-27 MED ORDER — ONDANSETRON HCL 4 MG/2ML IJ SOLN
4.0000 mg | Freq: Once | INTRAMUSCULAR | Status: AC
  Administered 2023-10-27: 4 mg via INTRAVENOUS

## 2023-10-27 MED ORDER — SODIUM CHLORIDE 0.9 % IV BOLUS
1000.0000 mL | Freq: Once | INTRAVENOUS | Status: AC
  Administered 2023-10-27: 1000 mL via INTRAVENOUS

## 2023-10-27 MED ORDER — POTASSIUM CHLORIDE 10 MEQ/100ML IV SOLN
10.0000 meq | INTRAVENOUS | Status: AC
  Administered 2023-10-27 (×3): 10 meq via INTRAVENOUS
  Filled 2023-10-27 (×4): qty 100

## 2023-10-27 MED ORDER — IOHEXOL 350 MG/ML SOLN
75.0000 mL | Freq: Once | INTRAVENOUS | Status: AC | PRN
Start: 2023-10-27 — End: 2023-10-27
  Administered 2023-10-27: 75 mL via INTRAVENOUS

## 2023-10-27 MED ORDER — ENOXAPARIN SODIUM 40 MG/0.4ML IJ SOSY: 40.0000 mg | PREFILLED_SYRINGE | INTRAMUSCULAR | Status: DC

## 2023-10-27 MED ORDER — HYDRALAZINE HCL 20 MG/ML IJ SOLN: 5.0000 mg | INTRAMUSCULAR | Status: DC | PRN

## 2023-10-27 MED ORDER — ONDANSETRON HCL 4 MG/2ML IJ SOLN
4.0000 mg | Freq: Once | INTRAMUSCULAR | Status: AC
  Administered 2023-10-27: 4 mg via INTRAVENOUS
  Filled 2023-10-27: qty 2

## 2023-10-27 MED ORDER — ONDANSETRON HCL 4 MG/2ML IJ SOLN
4.0000 mg | Freq: Four times a day (QID) | INTRAMUSCULAR | Status: DC | PRN
  Administered 2023-10-27 – 2023-10-28 (×2): 4 mg via INTRAVENOUS
  Filled 2023-10-27 (×2): qty 2

## 2023-10-27 MED ORDER — STROKE: EARLY STAGES OF RECOVERY BOOK
Freq: Once | Status: AC
  Filled 2023-10-27: qty 1

## 2023-10-27 MED ORDER — SODIUM CHLORIDE 0.9 % IV SOLN: INTRAVENOUS | Status: DC

## 2023-10-27 MED ORDER — MAGNESIUM SULFATE 2 GM/50ML IV SOLN
2.0000 g | Freq: Once | INTRAVENOUS | Status: AC
  Administered 2023-10-27: 2 g via INTRAVENOUS
  Filled 2023-10-27: qty 50

## 2023-10-27 MED ORDER — ALBUTEROL SULFATE (2.5 MG/3ML) 0.083% IN NEBU: 3.0000 mL | INHALATION_SOLUTION | Freq: Four times a day (QID) | RESPIRATORY_TRACT | Status: DC | PRN

## 2023-10-27 MED ORDER — SENNOSIDES-DOCUSATE SODIUM 8.6-50 MG PO TABS: 1.0000 | ORAL_TABLET | Freq: Every evening | ORAL | Status: DC | PRN

## 2023-10-27 MED ORDER — METOPROLOL TARTRATE 5 MG/5ML IV SOLN: 5.0000 mg | INTRAVENOUS | Status: DC | PRN

## 2023-10-27 MED ORDER — DIAZEPAM 5 MG/ML IJ SOLN
5.0000 mg | Freq: Once | INTRAMUSCULAR | Status: AC
  Administered 2023-10-27: 5 mg via INTRAVENOUS
  Filled 2023-10-27: qty 2

## 2023-10-27 MED ORDER — GABAPENTIN 300 MG PO CAPS
300.0000 mg | ORAL_CAPSULE | Freq: Once | ORAL | Status: AC
  Administered 2023-10-28: 300 mg via ORAL
  Filled 2023-10-27: qty 1

## 2023-10-27 MED ORDER — ACETAMINOPHEN 160 MG/5ML PO SOLN: 650.0000 mg | ORAL | Status: DC | PRN

## 2023-10-27 MED ORDER — HEPARIN (PORCINE) 25000 UT/250ML-% IV SOLN
INTRAVENOUS | Status: AC
Start: 2023-10-27 — End: 2023-10-28
  Filled 2023-10-27: qty 250

## 2023-10-27 MED ORDER — INSULIN ASPART 100 UNIT/ML IJ SOLN
0.0000 [IU] | Freq: Three times a day (TID) | INTRAMUSCULAR | Status: DC
  Administered 2023-10-28: 11 [IU] via SUBCUTANEOUS
  Administered 2023-10-28: 2 [IU] via SUBCUTANEOUS
  Administered 2023-10-29: 3 [IU] via SUBCUTANEOUS
  Administered 2023-10-29: 5 [IU] via SUBCUTANEOUS
  Administered 2023-10-29 – 2023-10-30 (×2): 2 [IU] via SUBCUTANEOUS
  Administered 2023-10-30: 3 [IU] via SUBCUTANEOUS

## 2023-10-27 MED ORDER — GABAPENTIN 300 MG PO CAPS
300.0000 mg | ORAL_CAPSULE | Freq: Two times a day (BID) | ORAL | Status: DC
  Administered 2023-10-28 – 2023-10-30 (×5): 300 mg via ORAL
  Filled 2023-10-27 (×5): qty 1

## 2023-10-27 NOTE — ED Triage Notes (Signed)
 Pt BIB ems for uncontrolled a-fib. Pt was weed eating outside. EMS state HR 170 pt started vomiting and converted back to a-fib in the 90s. Pt was given 4 mg zofran  and fluid bolus.

## 2023-10-27 NOTE — ED Notes (Signed)
 Wife went home- please call when transferred to admission bed Or if he becomes disgruntled again

## 2023-10-27 NOTE — ED Provider Notes (Signed)
 [place] EMERGENCY DEPARTMENT AT [facility] Provider Note   CSN: [id number] Arrival date & time: 10/27/23  1426  An emergency department physician performed an initial assessment on this suspected stroke patient at 1400.  Patient presents with: Tachycardia   Jonathan Gilmore is a 73 y.o. male.   Patient is a 73 year old male who presents to the emergency department with a chief complaint of dizziness with standing, chest pain, diaphoresis and generalized weakness.  Patient notes that symptoms began at approximately 1200 today.  Patient notes that he did experience similar symptoms yesterday which did resolve on their own.  Patient notes that he has no active chest pain or shortness of breath at this point.  He is only complaining of dizziness noted that is worse when he lies on his back and better with lying on his side.  He denies any associated vision or hearing changes.  He denies any associated unilateral weakness, numbness, paresthesias.  EMS was contacted and patient was noted to be in atrial fibrillation with RVR on initial presentation.  Patient has been off of his prescribed Eliquis  for approximate the past month.        Prior to Admission medications   Medication Sig Start Date End Date Taking? Authorizing Provider  ACCU-CHEK AVIVA PLUS test strip USE AS INSTRUCTED 06/13/19   Eldora Olam CROME, MD  albuterol  (VENTOLIN  HFA) 108 (90 Base) MCG/ACT inhaler Inhale 2 puffs into the lungs every 6 (six) hours as needed. 04/26/23   Leath-Warren, Etta PARAS, NP  amoxicillin -clavulanate (AUGMENTIN ) 875-125 MG tablet Take 1 tablet by mouth every 12 (twelve) hours. 04/26/23   Leath-Warren, Etta PARAS, NP  apixaban  (ELIQUIS ) 5 MG TABS tablet Take 1 tablet (5 mg total) by mouth 2 (two) times daily. 08/23/23   Alvan Dorn FALCON, MD  atorvastatin  (LIPITOR ) 80 MG tablet Take 1 tablet (80 mg total) by mouth daily. 02/17/23   Alvan Dorn FALCON, MD  Calcium -Magnesium -Zinc (CAL-MAG-ZINC PO) Take  1 tablet by mouth daily.    [provider]  ferrous sulfate  325 (65 FE) MG tablet Take 1 tablet (325 mg total) by mouth daily with breakfast. 06/30/22   Eartha Flavors, Toribio, MD  folic acid  (FOLVITE ) 1 MG tablet Take 1 tablet (1 mg total) by mouth daily. 04/05/22   Dennise Lavada POUR, MD  furosemide  (LASIX ) 40 MG tablet Take 1 tablet (40 mg total) by mouth as needed for edema (swelling). 05/11/23   Miriam Norris, NP  gabapentin  (NEURONTIN ) 300 MG capsule TAKE 1 TO 2 CAPSULES BY MOUTH  TWICE DAILY 06/03/23   Levora Reyes SAUNDERS, MD  JARDIANCE  10 MG TABS tablet TAKE 1 TABLET BY MOUTH DAILY  BEFORE BREAKFAST 12/21/22   Levora Reyes SAUNDERS, MD  lisinopril  (ZESTRIL ) 20 MG tablet Take 1 tablet (20 mg total) by mouth daily. 02/17/23 09/26/23  Alvan Dorn FALCON, MD  magnesium  oxide (MAG-OX) 400 MG tablet Take 400 mg by mouth at bedtime.    [provider]  meloxicam  (MOBIC ) 7.5 MG tablet Take 1 tablet (7.5 mg total) by mouth daily. 11/22/22   Margrette Taft BRAVO, MD  metFORMIN  (GLUCOPHAGE ) 500 MG tablet Take 2 tablets (1,000 mg total) by mouth 2 (two) times daily. TAKE 2 TABLETS BY MOUTH TWICE  DAILY 09/02/23   Levora Reyes SAUNDERS, MD  metoprolol  succinate (TOPROL -XL) 50 MG 24 hr tablet TAKE 1 TABLET BY MOUTH DAILY 04/21/23   Alvan Dorn FALCON, MD  Multiple Vitamin (MULTIVITAMIN WITH MINERALS) TABS Take 1 tablet by mouth daily.  [provider]  nitroGLYCERIN  (NITROSTAT ) 0.4 MG SL tablet Place 1 tablet (0.4 mg total) under the tongue every 5 (five) minutes x 3 doses as needed for chest pain (if no relief after 2nd dose, proceed to ED for an evaluation). 09/09/23   Alvan Dorn FALCON, MD  pantoprazole  (PROTONIX ) 40 MG tablet Take 1 tablet (40 mg total) by mouth 2 (two) times daily. 04/27/23   Carlan, Chelsea L, NP  promethazine -dextromethorphan (PROMETHAZINE -DM) 6.25-15 MG/5ML syrup Take 5 mLs by mouth 4 (four) times daily as needed. 04/26/23   Leath-Warren, Etta PARAS, NP    Allergies:  Morphine and codeine    Review of Systems  Neurological:  Positive for dizziness.  All other systems reviewed and are negative.   Updated Vital Signs BP (!) 144/79   Pulse 80   Temp (!) 97.5 F (36.4 C) (Temporal)   Resp 17   Ht 5' 7 (1.702 m)   Wt 81.6 kg   SpO2 97%   BMI 28.19 kg/m   Physical Exam Vitals and nursing note reviewed.  Constitutional:      Appearance: Normal appearance.  HENT:     Head: Normocephalic and atraumatic.     Nose: Nose normal.     Mouth/Throat:     Mouth: Mucous membranes are moist.  Eyes:     Extraocular Movements: Extraocular movements intact.     Conjunctiva/sclera: Conjunctivae normal.     Pupils: Pupils are equal, round, and reactive to light.  Cardiovascular:     Rate and Rhythm: Normal rate and regular rhythm.     Pulses: Normal pulses.     Heart sounds: Normal heart sounds. No murmur heard.    No gallop.  Pulmonary:     Effort: Pulmonary effort is normal. No respiratory distress.     Breath sounds: Normal breath sounds. No stridor. No wheezing, rhonchi or rales.  Abdominal:     General: Abdomen is flat. Bowel sounds are normal. There is no distension.     Palpations: Abdomen is soft.     Tenderness: There is no abdominal tenderness. There is no guarding.  Musculoskeletal:        General: Normal range of motion.     Cervical back: Normal range of motion and neck supple. No rigidity or tenderness.  Skin:    General: Skin is warm and dry.  Neurological:     General: No focal deficit present.     Mental Status: He is alert and oriented to person, place, and time. Mental status is at baseline.     Cranial Nerves: No cranial nerve deficit.     Sensory: No sensory deficit.     Motor: No weakness.     Gait: Gait normal.     Comments: Mild limb ataxia to the left, mild dysarthria, strength 5 out of 5 in bilateral upper and lower extremities  NIH of 2  Psychiatric:        Mood and Affect: Mood normal.        Behavior: Behavior  normal.        Thought Content: Thought content normal.        Judgment: Judgment normal.     (all labs ordered are listed, but only abnormal results are displayed) Labs Reviewed  COMPREHENSIVE METABOLIC PANEL WITH GFR - Abnormal; Notable for the following components:      Result Value   Potassium 3.1 (*)    Glucose, Bld 178 (*)    Total Bilirubin 1.7 (*)  Anion gap 18 (*)    All other components within normal limits  MAGNESIUM  - Abnormal; Notable for the following components:   Magnesium  1.5 (*)    All other components within normal limits  CBC WITH DIFFERENTIAL/PLATELET  TSH  CK  TROPONIN I (HIGH SENSITIVITY)  TROPONIN I (HIGH SENSITIVITY)    EKG: None  Radiology: [facility] Chest Port 1 View Result Date: 10/27/2023 CLINICAL DATA:  dyspnea. EXAM: PORTABLE CHEST 1 VIEW COMPARISON:  04/04/2022. FINDINGS: Low lung volume. Bilateral lung fields are clear. Bilateral costophrenic angles are clear. Note is made of elevated right hemidiaphragm. Stable cardio-mediastinal silhouette. No acute osseous abnormalities. Lower cervical spinal fixation hardware noted. The soft tissues are within normal limits. IMPRESSION: No active disease. Electronically Signed   By: Ree Molt M.D.   On: 10/27/2023 15:25     .Critical Care  Performed by: Daralene Lonni BIRCH, PA-C Authorized by: Daralene Lonni BIRCH, PA-C   Critical care provider statement:    Critical care time (minutes):  45   Critical care was necessary to treat or prevent imminent or life-threatening deterioration of the following conditions: CVA.   Critical care was time spent personally by me on the following activities:  Development of treatment plan with patient or surrogate, discussions with consultants, evaluation of patient's response to treatment, examination of patient, ordering and review of laboratory studies, ordering and review of radiographic studies, ordering and performing treatments and interventions, pulse oximetry,  re-evaluation of patient's condition and review of old charts   I assumed direction of critical care for this patient from another provider in my specialty: no     Care discussed with: admitting provider      Medications Ordered in the ED  potassium chloride  10 mEq in 100 mL IVPB (10 mEq Intravenous New Bag/Given 10/27/23 1659)  magnesium  sulfate IVPB 2 g 50 mL (2 g Intravenous New Bag/Given 10/27/23 1700)  heparin  25000 UT/250ML infusion (has no administration in time range)  meclizine  (ANTIVERT ) tablet 25 mg (has no administration in time range)  iohexol  (OMNIPAQUE ) 350 MG/ML injection 75 mL (has no administration in time range)  ondansetron  (ZOFRAN ) injection 4 mg (4 mg Intravenous Given 10/27/23 1439)  sodium chloride  0.9 % bolus 1,000 mL (0 mLs Intravenous Stopped 10/27/23 1520)  sodium chloride  0.9 % bolus 500 mL (0 mLs Intravenous Stopped 10/27/23 1615)  ondansetron  (ZOFRAN ) injection 4 mg (4 mg Intravenous Given 10/27/23 1528)  meclizine  (ANTIVERT ) tablet 25 mg (25 mg Oral Given 10/27/23 1529)  diazepam  (VALIUM ) injection 5 mg (5 mg Intravenous Given 10/27/23 1535)                                    Medical Decision Making Amount and/or Complexity of Data Reviewed Labs: ordered. Radiology: ordered.  Risk Prescription drug management. Decision regarding hospitalization.   This patient presents to the ED for concern of dizziness, chest pain, diaphoresis, generalized weakness, nausea and vomiting, this involves an extensive number of treatment options, and is a complaint that carries with it a high risk of complications and morbidity.  The differential diagnosis includes ACS, electrolyte derangement, CVA, TIA, dehydration, acute kidney injury, heatstroke   Co morbidities that complicate the patient evaluation  CAD, CHF, diabetes   Additional history obtained:  Additional history obtained from family External records from outside source obtained and reviewed including medical  records   Lab Tests:  I Ordered, and personally interpreted labs.  The pertinent results include: No leukocytosis, no anemia, hypokalemia noted, hypomagnesemia, elevated glucose, normal troponin, normal CK, normal TSH   Imaging Studies ordered:  I ordered imaging studies including MRI brain, MRA head, chest x-ray, CTA head and neck I independently visualized and interpreted imaging which showed ischemic change within the right cerebellum, vertebral artery occlusion I agree with the radiologist interpretation   Cardiac Monitoring: / EKG:  The patient was maintained on a cardiac monitor.  I personally viewed and interpreted the cardiac monitored which showed an underlying rhythm of: Atrial fibrillation, no ST/T wave changes, no ischemic changes, no STEMI   Consultations Obtained:  I requested consultation with the neurology, Dr. Rosemarie,  and discussed lab and imaging findings as well as pertinent plan - they recommend: Admission, resume Eliquis    Problem List / ED Course / Critical interventions / Medication management  Patient is doing well at this time and does remain stable.  On initial presentation patient was complaining of positional dizziness and was presumed to be secondary to peripheral vertigo as symptoms were reproducible with movement.  MRI brain was obtained as the patient has been off of his Eliquis  for the past month.  MRI did demonstrate cerebellar ischemic change.  Stroke alert was immediately called at that time and he was evaluated by neurology.  Neurology did order CTA head and neck and did recommend admission to the hospitalist service.  He did recommend resuming his Eliquis .  This does appear to be embolic in nature secondary to the patient's atrial fibrillation and the fact he has not been taking his Eliquis .  Potassium and magnesium  repletion were started in the emergency department.  Patient case was discussed with Dr. Laurita with the hospitalist service who has  excepted for admission. Patient was fully evaluated by attending physician who is in agreement to plan at this time.  I ordered medication including magnesium , potassium, Valium , IV fluids for dizziness Reevaluation of the patient after these medicines showed that the patient improved I have reviewed the patients home medicines and have made adjustments as needed   Social Determinants of Health:  None   Test / Admission - Considered:  Admission     Final diagnoses:  Cerebrovascular accident (CVA), unspecified mechanism (HCC)  Hypokalemia  Hypomagnesemia    ED Discharge Orders     None          Daralene Lonni JONETTA DEVONNA 10/27/23 1818    Cleotilde Rogue, MD 10/28/23 2000

## 2023-10-27 NOTE — ED Notes (Signed)
CODE STROKE activated  

## 2023-10-27 NOTE — H&P (Addendum)
 History and Physical    Jonathan Gilmore DOB: Mar 09, 1951 DOA: 10/27/2023  PCP: Levora Reyes SAUNDERS, MD  Patient coming from: Home  I have personally briefly reviewed patient's old medical records in [facility]  Chief Complaint: Vertigo  HPI: Jonathan Gilmore is a 73 y.o. male with medical history significant of HTN, IIDM, HLD, PAF on Eliquis  coming in with new onset of vertigo.    Symptoms started about noontime today, patient was working in his backyard and started to feel spinning sensations and legs about to give up.  But denied any vision problems or any weakness numbness of any of the limbs, 1 hour later, a neighbor passed by and talk to him and decided to call EMS.  Patient said that he has a history of of A-fib, however 1 week ago he stopped taking Eliquis  I totally forgot about the medication.  EMS arrived and found patient in rapid A-fib, heart rate 170s, and patient had nausea with vomiting and heart rate, converted back to normal sinus rhythm.  ED workup MRI showed right cerebellar stroke with swelling and slight mass effect on the fourth ventricle.  Patient was given aspirin , Zofran , meclizine  in ED.  Review of Systems: As per HPI otherwise 14 point review of systems negative.    Past Medical History:  Diagnosis Date   Abnormal cardiovascular stress test 03/2013   Anxiety    Arthritis    Back pain    Cancer (HCC)    Prostate cancer-no treatment per pt   CHF (congestive heart failure) (HCC)    Coronary artery disease    Multivessel with significant LAD involvement by chest CT 2014   Depression    Diastolic dysfunction    Essential hypertension    Lumbar herniated disc    L4-L5   MRSA (methicillin resistant staph aureus) culture positive    Myocardial infarction (HCC)    Neuropathy    Shortness of breath dyspnea    Sleep apnea    No cpap   Type 2 diabetes mellitus (HCC)     Past Surgical History:  Procedure Laterality Date   ANTERIOR  CERVICAL DECOMP/DISCECTOMY FUSION  01/18/2011   Procedure: ANTERIOR CERVICAL DECOMPRESSION/DISCECTOMY FUSION 2 LEVELS;  Surgeon: Victory LABOR Pool;  Location: [facility];  Service: Neurosurgery;  Laterality: N/A;  anterior cervical discectomy and fusion with allograft and plating, cervical five-six, cervical six-seven   BIOPSY N/A 04/04/2015   Procedure: BIOPSY;  Surgeon: Claudis RAYMOND Rivet, MD;  Location: [facility];  Service: Endoscopy;  Laterality: N/A;   BIOPSY  11/09/2019   Procedure: BIOPSY;  Surgeon: Eartha Angelia Sieving, MD;  [facility]: [facility];  Service: Gastroenterology;;   Bone spur removed  1997   Left shoulder   CARDIAC CATHETERIZATION N/A 08/08/2015   Procedure: Right/Left Heart Cath and Coronary Angiography;  Surgeon: Ozell Fell, MD;  Location: [facility] INVASIVE CV LAB;  Service: Cardiovascular;  Laterality: N/A;   CARPAL TUNNEL RELEASE Right    COLONOSCOPY WITH PROPOFOL  N/A 11/09/2019   Procedure: COLONOSCOPY WITH PROPOFOL ;  Surgeon: Eartha Angelia Sieving, MD;  [facility]: [facility];  Service: Gastroenterology;  Laterality: N/A;  900   COLONOSCOPY WITH PROPOFOL  N/A 02/27/2021   Procedure: COLONOSCOPY WITH PROPOFOL ;  Surgeon: Eartha Angelia Sieving, MD;  [facility]: [facility];  Service: Gastroenterology;  Laterality: N/A;  7:30   COLONOSCOPY WITH PROPOFOL  N/A 04/05/2022   Procedure: COLONOSCOPY WITH PROPOFOL ;  Surgeon: Shila Gustav GAILS, MD;  Location: [facility] ENDOSCOPY;  Service:  Gastroenterology;  Laterality: N/A;   ENTEROSCOPY N/A 06/30/2022   Procedure: ENTEROSCOPY;  Surgeon: Eartha Angelia Sieving, MD;  [facility]: [facility];  Service: Gastroenterology;  Laterality: N/A;  2:00pm;ASA 3   ESOPHAGEAL DILATION N/A 04/04/2015   Procedure: ESOPHAGEAL DILATION;  Surgeon: Claudis RAYMOND Rivet, MD;  [facility];  Service: Endoscopy;  Laterality: N/A;   ESOPHAGEAL DILATION N/A 11/09/2019   Procedure: ESOPHAGEAL DILATION;  Surgeon: Eartha Angelia Sieving, MD;   [facility];  Service: Gastroenterology;  Laterality: N/A;   ESOPHAGOGASTRODUODENOSCOPY N/A 04/04/2015   Procedure: ESOPHAGOGASTRODUODENOSCOPY (EGD);  Surgeon: Claudis RAYMOND Rivet, MD;  [facility];  Service: Endoscopy;  Laterality: N/A;  1240   ESOPHAGOGASTRODUODENOSCOPY (EGD) WITH PROPOFOL  N/A 11/09/2019   Procedure: ESOPHAGOGASTRODUODENOSCOPY (EGD) WITH PROPOFOL ;  Surgeon: Eartha Angelia Sieving, MD;  [facility];  Service: Gastroenterology;  Laterality: N/A;   ESOPHAGOGASTRODUODENOSCOPY (EGD) WITH PROPOFOL  N/A 04/04/2022   Procedure: ESOPHAGOGASTRODUODENOSCOPY (EGD) WITH PROPOFOL ;  Surgeon: Leigh Elspeth SQUIBB, MD;  Location: [facility] ENDOSCOPY;  Service: Gastroenterology;  Laterality: N/A;   ESOPHAGOGASTRODUODENOSCOPY (EGD) WITH PROPOFOL   06/30/2022   Procedure: ESOPHAGOGASTRODUODENOSCOPY (EGD) WITH PROPOFOL ;  Surgeon: Eartha Angelia Sieving, MD;  [facility];  Service: Gastroenterology;;   GIVENS CAPSULE STUDY N/A 05/11/2022   Procedure: GIVENS CAPSULE STUDY;  Surgeon: Eartha Angelia Sieving, MD;  [facility];  Service: Gastroenterology;  Laterality: N/A;  7:30 am   HEMORRHOID SURGERY     HOT HEMOSTASIS  06/30/2022   Procedure: HOT HEMOSTASIS (ARGON PLASMA COAGULATION/BICAP);  Surgeon: Eartha Angelia, Sieving, MD;  [facility];  Service: Gastroenterology;;   POLYPECTOMY  11/09/2019   Procedure: POLYPECTOMY;  Surgeon: Eartha Angelia Sieving, MD;  [facility];  Service: Gastroenterology;;   POLYPECTOMY  02/27/2021   Procedure: POLYPECTOMY;  Surgeon: Eartha Angelia Sieving, MD;  [facility];  Service: Gastroenterology;;   POLYPECTOMY  04/05/2022   Procedure: POLYPECTOMY;  Surgeon: Shila Gustav GAILS, MD;  Location: [facility] ENDOSCOPY;  Service: Gastroenterology;;   TONSILLECTOMY       reports that he has been smoking cigarettes. He started smoking about 55 years ago. He has a 55.9 pack-year smoking  history. He has quit using smokeless tobacco. He reports that he does not currently use alcohol. He reports current drug use. Frequency: 5.00 times per week. Drug: Marijuana.  Allergies  Allergen Reactions   Morphine And Codeine Nausea And Vomiting    Family History  Problem Relation Age of Onset   Lung cancer Mother    Diabetes Father    Hepatitis B Father    Diabetes Sister    Cushing syndrome Sister      Prior to Admission medications   Medication Sig Start Date End Date Taking? Authorizing Provider  nitroGLYCERIN  (NITROSTAT ) 0.4 MG SL tablet Place 1 tablet (0.4 mg total) under the tongue every 5 (five) minutes x 3 doses as needed for chest pain (if no relief after 2nd dose, proceed to ED for an evaluation). 09/09/23  Yes Alvan Dorn FALCON, MD  ACCU-CHEK AVIVA PLUS test strip USE AS INSTRUCTED 06/13/19   Eldora Olam CROME, MD  albuterol  (VENTOLIN  HFA) 108 (90 Base) MCG/ACT inhaler Inhale 2 puffs into the lungs every 6 (six) hours as needed. Patient not taking: Reported on 10/27/2023 04/26/23   Leath-Warren, Etta PARAS, NP  amoxicillin -clavulanate (AUGMENTIN ) 875-125 MG tablet Take 1 tablet by mouth every 12 (twelve) hours. 04/26/23   Leath-Warren, Etta PARAS, NP  apixaban  (ELIQUIS ) 5 MG TABS  tablet Take 1 tablet (5 mg total) by mouth 2 (two) times daily. 08/23/23   Alvan Dorn FALCON, MD  atorvastatin  (LIPITOR ) 80 MG tablet Take 1 tablet (80 mg total) by mouth daily. 02/17/23   Alvan Dorn FALCON, MD  Calcium -Magnesium -Zinc (CAL-MAG-ZINC PO) Take 1 tablet by mouth daily.    [provider]  ferrous sulfate  325 (65 FE) MG tablet Take 1 tablet (325 mg total) by mouth daily with breakfast. 06/30/22   Eartha Flavors, Toribio, MD  folic acid  (FOLVITE ) 1 MG tablet Take 1 tablet (1 mg total) by mouth daily. 04/05/22   Dennise Lavada POUR, MD  furosemide  (LASIX ) 40 MG tablet Take 1 tablet (40 mg total) by mouth as needed for edema (swelling). 05/11/23   Miriam Norris, NP  gabapentin   (NEURONTIN ) 300 MG capsule TAKE 1 TO 2 CAPSULES BY MOUTH  TWICE DAILY 06/03/23   Levora Reyes SAUNDERS, MD  JARDIANCE  10 MG TABS tablet TAKE 1 TABLET BY MOUTH DAILY  BEFORE BREAKFAST 12/21/22   Levora Reyes SAUNDERS, MD  lisinopril  (ZESTRIL ) 20 MG tablet Take 1 tablet (20 mg total) by mouth daily. 02/17/23 09/26/23  Alvan Dorn FALCON, MD  magnesium  oxide (MAG-OX) 400 MG tablet Take 400 mg by mouth at bedtime. Patient not taking: Reported on 10/27/2023    [provider]  meloxicam  (MOBIC ) 7.5 MG tablet Take 1 tablet (7.5 mg total) by mouth daily. Patient not taking: Reported on 10/27/2023 11/22/22   Margrette Taft BRAVO, MD  metFORMIN  (GLUCOPHAGE ) 500 MG tablet Take 2 tablets (1,000 mg total) by mouth 2 (two) times daily. TAKE 2 TABLETS BY MOUTH TWICE  DAILY 09/02/23   Levora Reyes SAUNDERS, MD  metoprolol  succinate (TOPROL -XL) 50 MG 24 hr tablet TAKE 1 TABLET BY MOUTH DAILY 04/21/23   Alvan Dorn FALCON, MD  Multiple Vitamin (MULTIVITAMIN WITH MINERALS) TABS Take 1 tablet by mouth daily.    [provider]  pantoprazole  (PROTONIX ) 40 MG tablet Take 1 tablet (40 mg total) by mouth 2 (two) times daily. 04/27/23   Carlan, Chelsea L, NP  promethazine -dextromethorphan (PROMETHAZINE -DM) 6.25-15 MG/5ML syrup Take 5 mLs by mouth 4 (four) times daily as needed. 04/26/23   Gilmer Etta PARAS, NP    Physical Exam: Vitals:   10/27/23 1515 10/27/23 1530 10/27/23 1545 10/27/23 1630  BP: 138/69 134/65 (!) 123/54 (!) 144/79  Pulse: 78 83 73 80  Resp: (!) 0 (!) 23 14 17   Temp:      TempSrc:      SpO2: 99% 99% 100% 97%  Weight:      Height:        Constitutional: NAD, calm, comfortable Vitals:   10/27/23 1515 10/27/23 1530 10/27/23 1545 10/27/23 1630  BP: 138/69 134/65 (!) 123/54 (!) 144/79  Pulse: 78 83 73 80  Resp: (!) 0 (!) 23 14 17   Temp:      TempSrc:      SpO2: 99% 99% 100% 97%  Weight:      Height:       Eyes: PERRL, lids and conjunctivae normal ENMT: Mucous membranes are moist.  Posterior pharynx clear of any exudate or lesions.Normal dentition.  Neck: normal, supple, no masses, no thyromegaly Respiratory: clear to auscultation bilaterally, no wheezing, no crackles. Normal respiratory effort. No accessory muscle use.  Cardiovascular: Regular rate and rhythm, no murmurs / rubs / gallops. No extremity edema. 2+ pedal pulses. No carotid bruits.  Abdomen: no tenderness, no masses palpated. No hepatosplenomegaly. Bowel sounds positive.  Musculoskeletal: no clubbing /  cyanosis. No joint deformity upper and lower extremities. Good ROM, no contractures. Normal muscle tone.  Skin: no rashes, lesions, ulcers. No induration Neurologic: CN 2-12 grossly intact. Sensation intact, DTR normal. Strength 5/5 in all 4.  Psychiatric: Normal judgment and insight. Alert and oriented x 3. Normal mood.     Labs on Admission: I have personally reviewed following labs and imaging studies  CBC: Recent Labs  Lab 10/27/23 1513  WBC 10.4  NEUTROABS 7.6  HGB 14.1  HCT 42.9  MCV 98.4  PLT 209   Basic Metabolic Panel: Recent Labs  Lab 10/27/23 1513  NA 140  K 3.1*  CL 100  CO2 22  GLUCOSE 178*  BUN 10  CREATININE 0.78  CALCIUM  8.9  MG 1.5*   GFR: Estimated Creatinine Clearance: 85.4 mL/min (by C-G formula based on SCr of 0.78 mg/dL). Liver Function Tests: Recent Labs  Lab 10/27/23 1513  AST 26  ALT 16  ALKPHOS 109  BILITOT 1.7*  PROT 7.0  ALBUMIN 3.8   No results for input(s): LIPASE, AMYLASE in the last 168 hours. No results for input(s): AMMONIA in the last 168 hours. Coagulation Profile: No results for input(s): INR, PROTIME in the last 168 hours. Cardiac Enzymes: Recent Labs  Lab 10/27/23 1513  CKTOTAL 54   BNP (last 3 results) No results for input(s): PROBNP in the last 8760 hours. HbA1C: No results for input(s): HGBA1C in the last 72 hours. CBG: No results for input(s): GLUCAP in the last 168 hours. Lipid Profile: No results for  input(s): CHOL, HDL, LDLCALC, TRIG, CHOLHDL, LDLDIRECT in the last 72 hours. Thyroid  Function Tests: Recent Labs    10/27/23 1513  TSH 1.615   Anemia Panel: No results for input(s): VITAMINB12, FOLATE, FERRITIN, TIBC, IRON, RETICCTPCT in the last 72 hours. Urine analysis:    Component Value Date/Time   COLORURINE STRAW (A) 01/16/2018 0938   APPEARANCEUR Clear 09/26/2023 1535   LABSPEC 1.008 01/16/2018 0938   PHURINE 7.0 01/16/2018 0938   GLUCOSEU Negative 09/26/2023 1535   HGBUR NEGATIVE 01/16/2018 0938   BILIRUBINUR Negative 09/26/2023 1535   KETONESUR NEGATIVE 01/16/2018 0938   PROTEINUR Negative 09/26/2023 1535   PROTEINUR NEGATIVE 01/16/2018 0938   UROBILINOGEN 0.2 08/23/2011 0807   NITRITE Negative 09/26/2023 1535   NITRITE NEGATIVE 01/16/2018 0938   LEUKOCYTESUR Negative 09/26/2023 1535    Radiological Exams on Admission: MR Brain Wo Contrast (neuro protocol) Result Date: 10/27/2023 EXAM: MRI BRAIN WITHOUT CONTRAST 10/27/2023 04:34:27 PM TECHNIQUE: Multiplanar multisequence MRI of the head/brain was performed without the administration of intravenous contrast. COMPARISON: MRI of the brain dated 09/10/2000. CLINICAL HISTORY: Syncope/presyncope, cerebrovascular cause suspected. Syncope; Limited positioning, pt combative. FINDINGS: BRAIN AND VENTRICLES: There is a new area of restricted diffusion present medially within the right cerebellar hemisphere. There is mild cerebellar swelling with partial effacement of the fourth ventricle. There is mild periventricular and deep cerebral white matter disease present. ORBITS: No acute abnormality. SINUSES AND MASTOIDS: Polypoidal mucosal disease present within the floors of the maxillary sinuses. BONES AND SOFT TISSUES: Normal marrow signal. No acute soft tissue abnormality. IMPRESSION: 1. New area of restricted diffusion medially within the right cerebellar hemisphere with mild cerebellar swelling and partial  effacement of the fourth ventricle, consistent with Nonhemorrhagic acute infarct. 2. Mild periventricular and deep cerebral white matter disease. Electronically signed by: evalene coho 10/27/2023 05:20 PM EDT RP Workstation: [id number]   DG Chest Port 1 View Result Date: 10/27/2023 CLINICAL DATA:  dyspnea. EXAM: PORTABLE CHEST 1  VIEW COMPARISON:  04/04/2022. FINDINGS: Low lung volume. Bilateral lung fields are clear. Bilateral costophrenic angles are clear. Note is made of elevated right hemidiaphragm. Stable cardio-mediastinal silhouette. No acute osseous abnormalities. Lower cervical spinal fixation hardware noted. The soft tissues are within normal limits. IMPRESSION: No active disease. Electronically Signed   By: Ree Molt M.D.   On: 10/27/2023 15:25    EKG: Independently reviewed.  A-fib, rate controlled, no acute ST changes.  Assessment/Plan Principal Problem:   Stroke [facility]) Active Problems:   Stroke (cerebrum) (HCC)  (please populate well all problems here in Problem List. (For example, if patient is on BP meds at home and you resume or decide to hold them, it is a problem that needs to be her. Same for CAD, COPD, HLD and so on)  Vertigo Acute right cerebellar infarct Noncompliant with anticoagulation therapy -Neurology consultation appreciated.  Not a TNK candidate for presentation of time window. - As per recommendation from neurology, will resume Eliquis  -Admitted to telemetry - Echocardiogram - As per recommendation of neurology will do a dedicated CTA - Meclizine  as needed, PT OT evaluation  PAF with RVR - Resolved - As needed Lopressor  for rate control - As per recommendation from neurology, will restart Eliquis   Hypokalemia - IV replacement  Hypomagnesemia - IV replacement, recheck mag level tomorrow  HTN - Hold off home BP meds -Start as needed hydralazine   IIDM - Hold off metformin  -Start SSI  Total time spent on patient care 55 minutes  DVT  prophylaxis: Eliquis  Code Status: Full code Family Communication: None at bedside Disposition Plan: Expect less than 2 midnight hospital stay Consults called: Neurology Admission status: Telemetry observation   Cort ONEIDA Mana MD [facility] Pager [phone]  10/27/2023, 5:54 PM

## 2023-10-27 NOTE — Progress Notes (Signed)
 [facility] Telemedicine Consult   Requesting Provider: Redell Pinal Consult Participants: patient, bedside RN and wife Location of the provider: [facility] Location of the patient: [facility]  This consult was provided via telemedicine with 2-way video and audio communication. The patient/family was informed that care would be provided in this way and agreed to receive care in this manner.    Chief Complaint: Dizziness  HPI: Jonathan Gilmore is a 73 year old Caucasian male who developed sudden onset of dizziness, vertigo and gait ataxia at 12 noon today.  He was working outside.  He states he did not get help earlier as did not have access to a phone.  Around 115 neighbor found him and called ambulance.  Patient arrived via EMS who found him to have A-fib with rapid heart rate of 170.  He was vomiting.  He converted back to sinus rhythm.  He was given Zofran  and fluid bolus.  Unclear reason patient went straight to an MRI which was done prior to CT head as patient's initial symptoms were not felt to be stroke related.  MRI shows acute right posterior inferior cerebral artery branch infarct with diffusion hyperintensity in the distal medial cerebellum with slight mass effect on the fourth ventricle.  There is also partial changes on FLAIR imaging.  MRa of the brain is motion degraded shows and shows some retrograde filling in the right vertebral but on coronal T2 images flow-voids both of the vertebral arteries appear to be patent and similar. There is also question of patient having transient episode of dizziness yesterday when he bent down but did not stay long and he recovered quickly He does have a history of longstanding atrial fibrillation and was on Eliquis  which he stopped on his own a month ago and when asked he stated he just got tired of taking it.  He had been taking an aspirin  instead.  LKW: 12 noon tnk given?: No, just outside TNK window IR Thrombectomy? No,  minimum deficits.  NIH of 2 Modified Rankin Scale: 0-Completely asymptomatic and back to baseline post- stroke Time of teleneurologist evaluation: 1635  Exam: Vitals:   10/27/23 1530 10/27/23 1545  BP: 134/65 (!) 123/54  Pulse: 83 73  Resp: (!) 23 14  Temp:    SpO2: 99% 100%    General:  Pleasant obese elderly Caucasian male in mild discomfort due to dizziness keeps his eyes closed. Patient is awake alert and interactive.  With mild dysarthria but can be understood.  No aphasia or apraxia.  Follows commands well.  He is oriented x 3.  Extraocular movements are full range without nystagmus.  Face is symmetric without weakness.  Blinks to threat bilaterally.  Tongue is midline.  Motor system exam no upper or lower extremity drift and symmetric equal strength.  Mild right finger-to-nose dysmetria.  No need to delay dysmetria.  Gait not tested.  Sensation intact bilaterally.  NIHSS ; 1A: Level of Consciousness - 0 1B: Ask Month and Age - 0 1C: 'Blink Eyes' & 'Squeeze Hands' - 0 2: Test Horizontal Extraocular Movements - 0 3: Test Visual Fields - 0 4: Test Facial Palsy - 0 5A: Test Left Arm Motor Drift - 0 5B: Test Right Arm Motor Drift - 0 6A: Test Left Leg Motor Drift - 0 6B: Test Right Leg Motor Drift - 0 7: Test Limb Ataxia - Gilmore 8: Test Sensation - 0 9: Test Language/Aphasia- 0 10: Test Dysarthria - Gilmore 11: Test Extinction/Inattention - 0  NIHSS score: 2 Premorbid modified Rankin score 0   Imaging Reviewed: MRI scan of the brain shows acute partial right PICA infarct with mild mass effect on fourth ventricle. MRI of the brain shows diminished flow in the terminal right vertebral with  right vertebral artery flow void is present on T2 images  Labs reviewed in epic and pertinent values follow: Pending   Assessment: 73 year old Caucasian male presenting with sudden onset of dizziness and gait ataxia vomiting due to right posterior inferior cerebellar artery branch infarct likely  due to cardioembolism from atrial fibrillation not on anticoagulation. Patient unfortunately has presented just outside the TNK window and MRI already shows an established infarct hence he will not qualify for thrombolysis.  His clinical deficits are too mild to consider thrombectomy at this time. Recommendations: Check CT angiogram of the brain and neck emergently.  Admit to medical hospitalist team.  Further stroke workup including echocardiogram, lipid profile, hemoglobin A1c.  Start Eliquis  if patient is able to swallow safely.  Statin if LDL is greater than 70.  Physical occupational and speech therapy consults.   Mild permissive hypertension.  DVT prophylaxis Kindly call back telestroke for neurological worsening or new changes. Long discussion at bedside with the patient and his wife and answered questions.  Discussed with Dr. Redell Pinal, ER, MD. Dr. Shelton to provide teleneurology follow-up tomorrow Kindly call for questions.  This patient is receiving care for possible acute neurological changes. There was 55 minutes of care by this provider at the time of service, including time for direct evaluation via telemedicine, review of medical records, imaging studies and discussion of findings with providers, the patient and/or family.  Eather Popp, MD [facility] [phone]  If 7pm- 7am, please page neurology on call as listed in AMION.

## 2023-10-28 ENCOUNTER — Observation Stay (HOSPITAL_COMMUNITY)

## 2023-10-28 ENCOUNTER — Encounter: Payer: Self-pay | Admitting: *Deleted

## 2023-10-28 DIAGNOSIS — K219 Gastro-esophageal reflux disease without esophagitis: Secondary | ICD-10-CM | POA: Diagnosis present

## 2023-10-28 DIAGNOSIS — I639 Cerebral infarction, unspecified: Principal | ICD-10-CM | POA: Diagnosis present

## 2023-10-28 DIAGNOSIS — E785 Hyperlipidemia, unspecified: Secondary | ICD-10-CM | POA: Diagnosis present

## 2023-10-28 DIAGNOSIS — E1169 Type 2 diabetes mellitus with other specified complication: Secondary | ICD-10-CM

## 2023-10-28 DIAGNOSIS — I5030 Unspecified diastolic (congestive) heart failure: Secondary | ICD-10-CM

## 2023-10-28 DIAGNOSIS — E876 Hypokalemia: Secondary | ICD-10-CM | POA: Diagnosis present

## 2023-10-28 DIAGNOSIS — I6523 Occlusion and stenosis of bilateral carotid arteries: Secondary | ICD-10-CM | POA: Diagnosis present

## 2023-10-28 DIAGNOSIS — Z79899 Other long term (current) drug therapy: Secondary | ICD-10-CM | POA: Diagnosis not present

## 2023-10-28 DIAGNOSIS — I252 Old myocardial infarction: Secondary | ICD-10-CM | POA: Diagnosis not present

## 2023-10-28 DIAGNOSIS — R29701 NIHSS score 1: Secondary | ICD-10-CM | POA: Diagnosis present

## 2023-10-28 DIAGNOSIS — Z833 Family history of diabetes mellitus: Secondary | ICD-10-CM | POA: Diagnosis not present

## 2023-10-28 DIAGNOSIS — R29702 NIHSS score 2: Secondary | ICD-10-CM | POA: Diagnosis present

## 2023-10-28 DIAGNOSIS — Q2111 Secundum atrial septal defect: Secondary | ICD-10-CM | POA: Diagnosis not present

## 2023-10-28 DIAGNOSIS — E1140 Type 2 diabetes mellitus with diabetic neuropathy, unspecified: Secondary | ICD-10-CM | POA: Diagnosis present

## 2023-10-28 DIAGNOSIS — I63541 Cerebral infarction due to unspecified occlusion or stenosis of right cerebellar artery: Secondary | ICD-10-CM | POA: Diagnosis present

## 2023-10-28 DIAGNOSIS — G936 Cerebral edema: Secondary | ICD-10-CM | POA: Diagnosis present

## 2023-10-28 DIAGNOSIS — F17200 Nicotine dependence, unspecified, uncomplicated: Secondary | ICD-10-CM

## 2023-10-28 DIAGNOSIS — Z7984 Long term (current) use of oral hypoglycemic drugs: Secondary | ICD-10-CM | POA: Diagnosis not present

## 2023-10-28 DIAGNOSIS — I480 Paroxysmal atrial fibrillation: Secondary | ICD-10-CM | POA: Diagnosis present

## 2023-10-28 DIAGNOSIS — Z7901 Long term (current) use of anticoagulants: Secondary | ICD-10-CM | POA: Diagnosis not present

## 2023-10-28 DIAGNOSIS — I5032 Chronic diastolic (congestive) heart failure: Secondary | ICD-10-CM | POA: Diagnosis present

## 2023-10-28 DIAGNOSIS — Z8546 Personal history of malignant neoplasm of prostate: Secondary | ICD-10-CM | POA: Diagnosis not present

## 2023-10-28 DIAGNOSIS — I10 Essential (primary) hypertension: Secondary | ICD-10-CM

## 2023-10-28 DIAGNOSIS — F32A Depression, unspecified: Secondary | ICD-10-CM | POA: Diagnosis present

## 2023-10-28 DIAGNOSIS — I110 Hypertensive heart disease with heart failure: Secondary | ICD-10-CM | POA: Diagnosis present

## 2023-10-28 DIAGNOSIS — I350 Nonrheumatic aortic (valve) stenosis: Secondary | ICD-10-CM | POA: Diagnosis not present

## 2023-10-28 DIAGNOSIS — I2510 Atherosclerotic heart disease of native coronary artery without angina pectoris: Secondary | ICD-10-CM | POA: Diagnosis present

## 2023-10-28 DIAGNOSIS — E611 Iron deficiency: Secondary | ICD-10-CM

## 2023-10-28 LAB — ECHOCARDIOGRAM COMPLETE
AR max vel: 0.77 cm2
AV Area VTI: 0.73 cm2
AV Area mean vel: 0.64 cm2
AV Mean grad: 20 mmHg
AV Peak grad: 29.7 mmHg
Ao pk vel: 2.73 m/s
Height: 67 in
P 1/2 time: 315 ms
S' Lateral: 3.6 cm
Weight: 2880 [oz_av]

## 2023-10-28 LAB — BASIC METABOLIC PANEL WITH GFR
Anion gap: 10 (ref 5–15)
BUN: 10 mg/dL (ref 8–23)
CO2: 26 mmol/L (ref 22–32)
Calcium: 8.7 mg/dL — ABNORMAL LOW (ref 8.9–10.3)
Chloride: 103 mmol/L (ref 98–111)
Creatinine, Ser: 0.63 mg/dL (ref 0.61–1.24)
GFR, Estimated: >60 mL/min (ref >60)
Glucose, Bld: 204 mg/dL — ABNORMAL HIGH (ref 70–99)
Potassium: 3.7 mmol/L (ref 3.5–5.1)
Sodium: 139 mmol/L (ref 135–145)

## 2023-10-28 LAB — LIPID PANEL
Cholesterol: 112 mg/dL (ref 0–200)
HDL: 34 mg/dL — ABNORMAL LOW (ref >40)
LDL Cholesterol: 66 mg/dL (ref 0–99)
Total CHOL/HDL Ratio: 3.3 ratio
Triglycerides: 60 mg/dL (ref <150)
VLDL: 12 mg/dL (ref 0–40)

## 2023-10-28 LAB — HEMOGLOBIN A1C
Hgb A1c MFr Bld: 7.1 % — ABNORMAL HIGH (ref 4.8–5.6)
Mean Plasma Glucose: 157 mg/dL

## 2023-10-28 LAB — GLUCOSE, CAPILLARY
Glucose-Capillary: 132 mg/dL — ABNORMAL HIGH (ref 70–99)
Glucose-Capillary: 146 mg/dL — ABNORMAL HIGH (ref 70–99)
Glucose-Capillary: 167 mg/dL — ABNORMAL HIGH (ref 70–99)
Glucose-Capillary: 313 mg/dL — ABNORMAL HIGH (ref 70–99)

## 2023-10-28 LAB — MAGNESIUM: Magnesium: 1.9 mg/dL (ref 1.7–2.4)

## 2023-10-28 NOTE — Assessment & Plan Note (Signed)
 Sable hgb and hct

## 2023-10-28 NOTE — Evaluation (Signed)
 Occupational Therapy Evaluation Patient Details Name: Jonathan Gilmore MRN: [id number] DOB: [birth year] Today's Date: 10/28/2023   History of Present Illness   Jonathan Gilmore is a 73 y.o. male with medical history significant of HTN, IIDM, HLD, PAF on Eliquis  coming in with new onset of vertigo.       Symptoms started about noontime today, patient was working in his backyard and started to feel spinning sensations and legs about to give up.  But denied any vision problems or any weakness numbness of any of the limbs, 1 hour later, a neighbor passed by and talk to him and decided to call EMS.  Patient said that he has a history of of A-fib, however 1 week ago he stopped taking Eliquis  I totally forgot about the medication.  EMS arrived and found patient in rapid A-fib, heart rate 170s, and patient had nausea with vomiting and heart rate, converted back to normal sinus rhythm.  ED workup MRI showed right cerebellar stroke with swelling and slight mass effect on the fourth ventricle. (per MD)     Clinical Impressions Pt agreeable to OT and PT co-evaluation. Pt reports independence at baseline with wife stating she can be present at home 24/7. Pt required min to mod A for supine to sit bed mobility with poor sitting balance and reports of significant dizziness and nausea. Mod A for sit to stand and step pivot to chair without AD. Pt able to take a few steps to and from the chair with mod A as well. Pt is very unsteady on his feet at this time. Pt able to complete seated ADL's with min A mostly to keep the pt's balance while sitting. Pt left in the bed with the bed alarm set and call bell within reach. Pt will benefit from continued OT in the hospital and recommended venue below to increase strength, balance, and endurance for safe ADL's.        If plan is discharge home, recommend the following:   A lot of help with walking and/or transfers;A little help with bathing/dressing/bathroom;Assistance  with cooking/housework;Assist for transportation;Help with stairs or ramp for entrance     Functional Status Assessment   Patient has had a recent decline in their functional status and demonstrates the ability to make significant improvements in function in a reasonable and predictable amount of time.     Equipment Recommendations   None recommended by OT     Recommendations for Other Services   Rehab consult     Precautions/Restrictions   Precautions Precautions: Fall Recall of Precautions/Restrictions: Impaired Restrictions Weight Bearing Restrictions Per Provider Order: No     Mobility Bed Mobility Overal bed mobility: Needs Assistance Bed Mobility: Supine to Sit, Sit to Supine     Supine to sit: Mod assist, Min assist Sit to supine: Contact guard assist   General bed mobility comments: Unsteady with increase in dizziness when attempting to sit at EOB.    Transfers Overall transfer level: Needs assistance Equipment used: 1 person hand held assist (gait belt) Transfers: Sit to/from Stand, Bed to chair/wheelchair/BSC Sit to Stand: Mod assist     Step pivot transfers: Mod assist     General transfer comment: EOB to chair without AD; very unsteady      Balance Overall balance assessment: Needs assistance Sitting-balance support: No upper extremity supported, Feet supported Sitting balance-Leahy Scale: Poor Sitting balance - Comments: poor to fair seated at EOB Postural control:  (Leaning in multiple directions) Standing balance support:  During functional activity, Bilateral upper extremity supported (Assisted by therapist) Standing balance-Leahy Scale: Poor Standing balance comment: Using hand held assist and grabbing therapist for support.                           ADL either performed or assessed with clinical judgement   ADL Overall ADL's : Needs assistance/impaired Eating/Feeding: Independent;Sitting   Grooming: Set up;Sitting    Upper Body Bathing: Set up;Sitting   Lower Body Bathing: Sitting/lateral leans;Contact guard assist   Upper Body Dressing : Set up;Sitting   Lower Body Dressing: Sitting/lateral leans;Contact guard assist Lower Body Dressing Details (indicate cue type and reason): Able to don socks seated at EOB; CGA due to balance deficits and dizziness. Toilet Transfer: Moderate assistance;Ambulation Toilet Transfer Details (indicate cue type and reason): EOB to chair without AD Toileting- Clothing Manipulation and Hygiene: Contact guard assist;Minimal assistance;Sit to/from stand Toileting - Clothing Manipulation Details (indicate cue type and reason): Pt stood to urinate in the hand held urinal with CGA to min A for balance to stand. Tub/ Shower Transfer: Moderate assistance   Functional mobility during ADLs: Moderate assistance General ADL Comments: Able to take a few steps forward and back ward without AD, but assist to maintain standing balance.     Vision Baseline Vision/History: 0 No visual deficits Ability to See in Adequate Light: 0 Adequate Patient Visual Report: No change from baseline Vision Assessment?: No apparent visual deficits Additional Comments: Not yet formally assessed. No obvious deficits. Further assessment needed.     Perception Perception: Not tested       Praxis Praxis: Not tested       Pertinent Vitals/Pain Pain Assessment Pain Assessment: No/denies pain (nausea)     Extremity/Trunk Assessment Upper Extremity Assessment Upper Extremity Assessment: LUE deficits/detail LUE Deficits / Details: 4+/5 flexion and extenstion of wrist and elbow. Noted to be heavy handed with finger to nose test. LUE Sensation: WNL LUE Coordination: decreased gross motor   Lower Extremity Assessment Lower Extremity Assessment: Defer to PT evaluation   Cervical / Trunk Assessment Cervical / Trunk Assessment: Normal   Communication Communication Communication: No apparent  difficulties   Cognition Arousal: Alert Behavior During Therapy: WFL for tasks assessed/performed, Impulsive Cognition: No apparent impairments                               Following commands: Intact       Cueing  General Comments   Cueing Techniques: Verbal cues;Tactile cues                 Home Living Family/patient expects to be discharged to:: Private residence Living Arrangements: Spouse/significant other Available Help at Discharge: Family;Available 24 hours/day Type of Home: House Home Access: Stairs to enter [employer] of Steps: 3-4 Entrance Stairs-Rails: Right (going up) Home Layout: One level     Bathroom Shower/Tub: Producer, television/film/video: Standard Bathroom Accessibility: No   Home Equipment: None          Prior Functioning/Environment Prior Level of Function : Independent/Modified Independent             Mobility Comments: Tourist information centre manager without AD; drives ADLs Comments: Independent    OT Problem List: Decreased range of motion;Decreased strength;Decreased activity tolerance;Impaired balance (sitting and/or standing);Impaired vision/perception;Decreased coordination;Decreased safety awareness   OT Treatment/Interventions: Self-care/ADL training;Therapeutic exercise;DME and/or AE instruction;Therapeutic activities;Neuromuscular education;Visual/perceptual remediation/compensation;Patient/family education;Balance training  OT Goals(Current goals can be found in the care plan section)   Acute Rehab OT Goals Patient Stated Goal: Improve function OT Goal Formulation: With patient Time For Goal Achievement: 11/11/23 Potential to Achieve Goals: Good   OT Frequency:  Min 3X/week    Co-evaluation PT/OT/SLP Co-Evaluation/Treatment: Yes Reason for Co-Treatment: To address functional/ADL transfers   OT goals addressed during session: ADL's and self-care      AM-PAC OT 6 Clicks Daily Activity      Outcome Measure Help from another person eating meals?: None Help from another person taking care of personal grooming?: A Little Help from another person toileting, which includes using toliet, bedpan, or urinal?: A Little Help from another person bathing (including washing, rinsing, drying)?: A Little Help from another person to put on and taking off regular upper body clothing?: A Little Help from another person to put on and taking off regular lower body clothing?: A Little 6 Click Score: 19   End of Session Equipment Utilized During Treatment: Gait belt  Activity Tolerance: Patient tolerated treatment well Patient left: in bed;with call bell/phone within reach;with family/visitor present  OT Visit Diagnosis: Unsteadiness on feet (R26.81);Other abnormalities of gait and mobility (R26.89);Muscle weakness (generalized) (M62.81);Other symptoms and signs involving the nervous system (R29.898);Dizziness and giddiness (R42)                Time: 9196-9170 OT Time Calculation (min): 26 min Charges:  OT General Charges $OT Visit: 1 Visit OT Evaluation $OT Eval Low Complexity: 1 Low  Mirza Fessel OT, MOT   Jayson Person 10/28/2023, 10:09 AM

## 2023-10-28 NOTE — Assessment & Plan Note (Signed)
 Continue with pantoprazole 

## 2023-10-28 NOTE — Telephone Encounter (Deleted)
 Dr. Alvan - his echo is scheduled for Monday, 8/18 - do you maybe want to add him onto one day next week in [place] ???

## 2023-10-28 NOTE — Assessment & Plan Note (Addendum)
 Neuro deficit has been improving, initially recommended CIR, but fortunately continue to improve and now deemed stable to go home with home health services.   Continue apixaban  for anticoagulation.  Continue blood pressure control and statin.  Follow up with neurology as outpatient.

## 2023-10-28 NOTE — Hospital Course (Addendum)
 Jonathan Gilmore was admitted to the hospital with the working diagnosis of acute cerebrovascular accident.   73 yo male with the past medical history of hypertension, hyperlipidemia, T2DM, paroxysmal atrial fibrillation who presented with vertigo.   Acute onset of symptoms, while working in is backyard. Severe vertigo and sensation of legs giving up. Apparently has not been taking apixaban  for over one week. EMS was called he was found in atrial fibrillation with RVR and was transported to the ED. On his initial physical examination his blood pressure was 138/69, HR 83, RR 23 and 02 saturation 97%, lungs with no wheezing or rhonchi, heart with S1 and S2 present and tachycardic, systolic murmur at the base, abdomen with no distention and no lower extremity edema. Neurologically he was non focal.   Na 140, K 3.1 Cl 100 bicarbonate 22 glucose 178, bun 10 cr 0,78  Mg 1,5  AST 26 ALT 16  Ck 54 High sensitive troponin 5 and 7  Wbc 10,4 hgb 14.1 plt 209  TSH 1,61 Hgb A1c 7.1   Chest radiograph with hypoinflation, positive cardiomegaly, calcified aortic notch, no infiltrates or effusions.   EKG 79 bpm, normal axis, normal intervals, qtc 425, sinus rhythm with poor RR wave progression, ST depression and T wave inversion in lead II, III, aVF, V 4 to V6  Brain MRI MRA with new area of restricted diffusion medially within the right cerebellar hemisphere with mild cerebral edema and partial effacement of the fourth ventricle.  Mild periventricular and deep cerebral white matter disease.   CT angiography with acute right posterior inferior cerebellar artery infarct with diffusion hyperintensity in the distal medial cerebellum and slight mass effect on the fourth ventricle.  Complete occlusion of the V1 and V2 segments of the right vertebral artery with patent V3 segment.  Moderate to severe multifocal stenosis bilaterally in the vertebral arteries.  Abrupt occlusion or narrowing of the distal P2 segment of the  right posterior cerebral artery.  Moderate calcific atheromatous disease in the aortic arch ad carotid arteries with mild to moderate stenosis in the right internal carotid artery and moderate stenosis in the left internal carotid artery.   Patient was resumed on anticoagulation with apixaban  and had frequent neuro checks.  PT, OT and neurology consultation.   Initially deemed candidate for CIR.  08/16 improved mobility with PT  08/17 patient going home with home health services.

## 2023-10-28 NOTE — Progress Notes (Signed)
   10/28/23 0923  TOC Brief Assessment  Insurance and Status Reviewed  Patient has primary care physician Yes  Home environment has been reviewed From home  Prior level of function: Independent  Prior/Current Home Services No current home services  Social Drivers of Health Review SDOH reviewed interventions complete (smoking cessation added to AVS)  Readmission risk has been reviewed Yes  Transition of care needs no transition of care needs at this time   Transition of Care Department [facility]) has reviewed patient and no TOC needs have been identified at this time. We will continue to monitor patient advancement through interdisciplinary progression rounds. If new patient transition needs arise, please place a TOC consult.

## 2023-10-28 NOTE — Progress Notes (Addendum)
  Progress Note   Patient: Jonathan Gilmore FMW:[id number] DOB: August 27, 1950 DOA: 10/27/2023     0 DOS: the patient was seen and examined on 10/28/2023   Brief hospital course: Mr. Tolen was admitted to the hospital with the working diagnosis of acute cerebrovascular accident.   73 yo male with the past medical history of hypertension, hyperlipidemia, T2DM, paroxysmal atrial fibrillation who presented with vertigo.   Acute onset of symptoms, while working in is backyard. Apparently has not been taking apixaban  for over one week. EMS was called he was found in atrial fibrillation with RVR and was transported to the ED. On his initial physical examination his blood pressure was 138/69, HR 83, RR 23 and 02 saturation 97%, lungs with no wheezing or rhonchi, heart with S1 and S2 present and tachycardic, abdomen with no distention and no lower extremity edema. Neurologically he was non focal.   Brain MRI MRA with new area of restricted diffusion medially within the right cerebellar hemisphere with mild cerebral edema and partial effacement of the fourth ventricle.  Mild periventricular and deep cerebral white matter disease.   Assessment and Plan: Acute CVA (cerebrovascular accident) (HCC) Clinically improving but not yet back to baseline.   Continue apixaban  for anticoagulation.  Continue blood pressure control and statin.  If worsening symptoms recommended repeat stat head CT, discussed with Dr Shelton from neurology. Possible transfer to CIR.   Paroxysmal atrial fibrillation (HCC) Continue anticoagulation with apixaban .  Rate control with metoprolol  succinate 50 mg po daily.   Essential hypertension Continue blood pressure control with metoprolol .   Diastolic CHF (HCC) No signs of acute decompensation  Continue metoprolol  succinate  Type 2 diabetes mellitus with hyperlipidemia (HCC) Continue glucose cover and monitoring with insulin  sliding scale.  Glucose has been controlled  Continue with  statin therapy   GERD (gastroesophageal reflux disease) Continue with pantoprazole    Tobacco dependence Smoking cessation counseling   Iron deficiency Sable hgb and hct   Hypokalemia Hypomagnesemia.   Patient has received K and Mg.  Will follow up renal function and electrolytes.   Subjective: Patient with improving vertigo but not yet back to baseline, continue weak and deconditioned, no chest pain or dyspnea   Physical Exam: Vitals:   10/27/23 2208 10/28/23 0231 10/28/23 0612 10/28/23 1527  BP: 131/62 124/63 (!) 115/51 (!) 130/54  Pulse: (!) 41 89 95 83  Resp: 16 17 18    Temp: 97.7 F (36.5 C) 98.2 F (36.8 C) 98.6 F (37 C) 98.1 F (36.7 C)  TempSrc: Oral   Oral  SpO2: 100% 96% 97% 98%  Weight:      Height:       Neurology awake and alert ENT with mild pallor Cardiovascular with S1 and S2 present and irregularly irregular with no gallops, rubs or murmurs Respiratory with no rales or wheezing, no rhonchi  Abdomen with no distention  No lower extremity edema  Data Reviewed:   Family Communication: no family at the bedside   Disposition: Status is: Inpatient Remains inpatient appropriate because: pending transfer to CIR   Planned Discharge Destination: Rehab     Author: Elidia Toribio Furnace, MD 10/28/2023 4:52 PM  For on call review [email or web address].

## 2023-10-28 NOTE — Evaluation (Signed)
 Physical Therapy Evaluation Patient Details Name: Jonathan Gilmore MRN: [id number] DOB: Jan 10, 1951 Today's Date: 10/28/2023  History of Present Illness  Jonathan Gilmore is a 73 y.o. male with medical history significant of HTN, IIDM, HLD, PAF on Eliquis  coming in with new onset of vertigo.       Symptoms started about noontime today, patient was working in his backyard and started to feel spinning sensations and legs about to give up.  But denied any vision problems or any weakness numbness of any of the limbs, 1 hour later, a neighbor passed by and talk to him and decided to call EMS.  Patient said that he has a history of of A-fib, however 1 week ago he stopped taking Eliquis  I totally forgot about the medication.  EMS arrived and found patient in rapid A-fib, heart rate 170s, and patient had nausea with vomiting and heart rate, converted back to normal sinus rhythm.  ED workup MRI showed right cerebellar stroke with swelling and slight mass effect on the fourth ventricle.   Clinical Impression  At this time, patient requires min-modA with all functional tasks due to decreased motor control and postural reactions that put him at an increased risk for falls. Patient presents with significantly impaired static and dynamic trunk instability with functional tasks. Patient demonstrates multi-directional postural sway and c/o dizziness that does not resolve with rest. He was able tolerate very short distance ambulation at bedside and demonstrates poor standing balance with uncoordinated, ataxic movements. Patient limited in today's evaluation due to nausea-nursing notified. Patient left in bed with call bed in reach. Patient will benefit from continued skilled physical therapy in hospital and recommended venue below to increase strength, balance, endurance for safe ADLs and gait.         If plan is discharge home, recommend the following: A lot of help with walking and/or transfers;Help with stairs or ramp  for entrance;Assist for transportation   Can travel by private vehicle        Equipment Recommendations None recommended by PT  Recommendations for Other Services       Functional Status Assessment Patient has had a recent decline in their functional status and demonstrates the ability to make significant improvements in function in a reasonable and predictable amount of time.     Precautions / Restrictions Precautions Precautions: Fall Recall of Precautions/Restrictions: Impaired Restrictions Weight Bearing Restrictions Per Provider Order: No      Mobility  Bed Mobility Overal bed mobility: Needs Assistance Bed Mobility: Supine to Sit, Sit to Supine     Supine to sit: Mod assist, Min assist Sit to supine: Contact guard assist   General bed mobility comments: Unsteady with increase in dizziness when attempting to sit at EOB.    Transfers Overall transfer level: Needs assistance Equipment used: 1 person hand held assist Transfers: Sit to/from Stand, Bed to chair/wheelchair/BSC Sit to Stand: Mod assist   Step pivot transfers: Mod assist       General transfer comment: EOB to chair without AD; very unsteady    Ambulation/Gait Ambulation/Gait assistance: Mod assist Gait Distance (Feet): 8 Feet Assistive device: 1 person hand held assist Gait Pattern/deviations: Ataxic, Wide base of support, Staggering right, Staggering left Gait velocity: decreased     General Gait Details: Very unsteady, staggering without AD  Stairs            Wheelchair Mobility     Tilt Bed    Modified Rankin (Stroke Patients Only)  Balance Overall balance assessment: Needs assistance Sitting-balance support: No upper extremity supported, Feet supported Sitting balance-Leahy Scale: Poor Sitting balance - Comments: poor to fair seated at EOB Postural control: Other (comment) (seated and standing sway in all directions) Standing balance support: During functional  activity, Bilateral upper extremity supported Standing balance-Leahy Scale: Poor Standing balance comment: Using hand held assist and grabbing therapist for support.                             Pertinent Vitals/Pain Pain Assessment Pain Assessment: No/denies pain    Home Living Family/patient expects to be discharged to:: Private residence Living Arrangements: Spouse/significant other Available Help at Discharge: Family;Available 24 hours/day Type of Home: House Home Access: Stairs to enter Entrance Stairs-Rails: Right Entrance Stairs-Number of Steps: 3-4   Home Layout: One level Home Equipment: None      Prior Function Prior Level of Function : Independent/Modified Independent             Mobility Comments: Community ambulator without AD; drives ADLs Comments: Independent     Extremity/Trunk Assessment   Upper Extremity Assessment Upper Extremity Assessment: Defer to OT evaluation LUE Deficits / Details: 4+/5 flexion and extenstion of wrist and elbow. Noted to be heavy handed with finger to nose test. LUE Sensation: WNL LUE Coordination: decreased gross motor    Lower Extremity Assessment Lower Extremity Assessment: LLE deficits/detail;RLE deficits/detail RLE Deficits / Details: gross motor MMT WNL RLE Sensation: WNL RLE Coordination: decreased gross motor LLE Deficits / Details: gross motor MMT WNL LLE Sensation: WNL LLE Coordination: decreased gross motor    Cervical / Trunk Assessment Cervical / Trunk Assessment: Normal  Communication   Communication Communication: No apparent difficulties    Cognition Arousal: Alert Behavior During Therapy: WFL for tasks assessed/performed, Impulsive                             Following commands: Intact       Cueing Cueing Techniques: Verbal cues, Tactile cues     General Comments      Exercises     Assessment/Plan    PT Assessment Patient needs continued PT services  PT  Problem List Decreased strength;Decreased mobility;Decreased activity tolerance;Decreased balance;Decreased coordination;Decreased safety awareness       PT Treatment Interventions DME instruction;Therapeutic activities;Gait training;Therapeutic exercise;Stair training;Balance training;Neuromuscular re-education;Functional mobility training    PT Goals (Current goals can be found in the Care Plan section)  Acute Rehab PT Goals Patient Stated Goal: return home PT Goal Formulation: With patient/family Time For Goal Achievement: 11/11/23 Potential to Achieve Goals: Good    Frequency Min 3X/week     Co-evaluation PT/OT/SLP Co-Evaluation/Treatment: Yes Reason for Co-Treatment: To address functional/ADL transfers PT goals addressed during session: Mobility/safety with mobility;Balance;Proper use of DME OT goals addressed during session: ADL's and self-care       AM-PAC PT 6 Clicks Mobility  Outcome Measure Help needed turning from your back to your side while in a flat bed without using bedrails?: A Little Help needed moving from lying on your back to sitting on the side of a flat bed without using bedrails?: A Little Help needed moving to and from a bed to a chair (including a wheelchair)?: A Lot Help needed standing up from a chair using your arms (e.g., wheelchair or bedside chair)?: A Lot Help needed to walk in hospital room?: A Lot Help needed climbing 3-5  steps with a railing? : Total 6 Click Score: 13    End of Session   Activity Tolerance: Patient limited by fatigue;Other (comment) (nausea) Patient left: in bed Nurse Communication: Mobility status PT Visit Diagnosis: Unsteadiness on feet (R26.81);Other abnormalities of gait and mobility (R26.89);Muscle weakness (generalized) (M62.81)    Time: 9198-9171 PT Time Calculation (min) (ACUTE ONLY): 27 min   Charges:   PT Evaluation $PT Eval Moderate Complexity: 1 Mod PT Treatments $Therapeutic Activity: 23-37 mins PT  General Charges $$ ACUTE PT VISIT: 1 Visit         12:18 PM, 10/28/23,  Onnie Como, SPT

## 2023-10-28 NOTE — Plan of Care (Signed)
   Problem: Education: Goal: Knowledge of disease or condition will improve Outcome: Progressing

## 2023-10-28 NOTE — Progress Notes (Signed)
 Echocardiogram 2D Echocardiogram has been performed.  Tinnie FORBES Gosling RDCS 10/28/2023, 1:49 PM

## 2023-10-28 NOTE — Plan of Care (Signed)
  Problem: Acute Rehab OT Goals (only OT should resolve) Goal: Pt. Will Perform Grooming Flowsheets (Taken 10/28/2023 1011) Pt Will Perform Grooming:  with modified independence  standing Goal: Pt. Will Perform Upper Body Dressing Flowsheets (Taken 10/28/2023 1011) Pt Will Perform Upper Body Dressing: with modified independence Goal: Pt. Will Perform Lower Body Dressing Flowsheets (Taken 10/28/2023 1011) Pt Will Perform Lower Body Dressing: with modified independence Goal: Pt. Will Transfer To Toilet Flowsheets (Taken 10/28/2023 1011) Pt Will Transfer to Toilet:  with modified independence  ambulating Goal: Pt. Will Perform Toileting-Clothing Manipulation Flowsheets (Taken 10/28/2023 1011) Pt Will Perform Toileting - Clothing Manipulation and hygiene: with modified independence Goal: Pt/Caregiver Will Perform Home Exercise Program Flowsheets (Taken 10/28/2023 1011) Pt/caregiver will Perform Home Exercise Program:  Increased strength and coordination   Left upper extremity  Independently  Right Upper extremity  Derryl Uher OT, MOT

## 2023-10-28 NOTE — Assessment & Plan Note (Addendum)
 Patient was placed on insulin  sliding scale for glucose cover and monitoring  Glucose has been controlled At discharge added metformin .   Continue with statin therapy

## 2023-10-28 NOTE — Progress Notes (Signed)
 Inpatient Rehab Admissions Coordinator:    I spoke with Pt. And wife regarding potential CIR admit They are interested. Wife can provide 24/7 min A at discharge. I did let her know that CIR may not have a bed until the middle of next week, but she still wants to pursue CIR admit. I will send case to insurance once workup is complete.   Leita Kleine, MS, CCC-SLP Rehab Admissions Coordinator  [phone] (celll) [phone] (office)

## 2023-10-28 NOTE — Assessment & Plan Note (Addendum)
 Smoking cessation counseling  Added nicotine  patch.

## 2023-10-28 NOTE — Assessment & Plan Note (Addendum)
 Hypomagnesemia.   At the time of  his discharge his serum cr is 0,55 with K at 3,6 and serum bicarbonate at 26  Na 139  Mg 1.8   Patient will have 40 meq Kcl prior to his discharge.  Follow up renal function and electrolytes as outpatient.

## 2023-10-28 NOTE — Plan of Care (Signed)
  Problem: Acute Rehab PT Goals(only PT should resolve) Goal: Pt Will Go Supine/Side To Sit Outcome: Progressing Flowsheets (Taken 10/28/2023 1218) Pt will go Supine/Side to Sit:  with minimal assist  with contact guard assist Goal: Patient Will Transfer Sit To/From Stand Outcome: Progressing Flowsheets (Taken 10/28/2023 1218) Patient will transfer sit to/from stand: with minimal assist Goal: Pt Will Transfer Bed To Chair/Chair To Bed Outcome: Progressing Flowsheets (Taken 10/28/2023 1218) Pt will Transfer Bed to Chair/Chair to Bed: with min assist Goal: Pt Will Ambulate Outcome: Progressing Flowsheets (Taken 10/28/2023 1218) Pt will Ambulate:  25 feet  with least restrictive assistive device  with minimal assist

## 2023-10-28 NOTE — Progress Notes (Signed)
 Inpatient Rehab Admissions Coordinator:   Per therapy recommendation,  patient was screened for CIR candidacy by Leita Kleine, MS, CCC-SLP. At this time, Pt. Appears to be a a potential candidate for CIR. I will place   order for rehab consult per protocol for full assessment. Please contact me any with questions.  Leita Kleine, MS, CCC-SLP Rehab Admissions Coordinator  [phone] (celll) [phone] (office)

## 2023-10-28 NOTE — Assessment & Plan Note (Signed)
 Continue anticoagulation with apixaban .  Rate control with metoprolol  succinate 50 mg po daily.

## 2023-10-28 NOTE — Assessment & Plan Note (Addendum)
 Echocardiogram with preserved LV systolic function with EF 65 to 70%, no regional wall motion abnormalities, moderate LVH, RV systolic function is preserved, moderate dilatation LA, RA with normal size, mild mitral valve regurgitation, severe aortic valve stenosis. Atrial level shunting with moderate sized secundum atrial septal defect with predominant left to right shunting across the atrial septum.   No signs of acute decompensation  Continue metoprolol  succinate and resume lisinopril .  As needed loop diuretic with furosemide .

## 2023-10-28 NOTE — Consult Note (Signed)
 SLP Cancellation Note  Patient Details Name: Jonathan Gilmore MRN: [id number] DOB: 1950-05-07   Cancelled treatment:       Reason Eval/Treat Not Completed: Patient declined, no reason specified; SLP attempted evaluation, but pt adamantly refused; requested for SLP to return another date.  SLP will continue to f/u for potential ST need as pt able/SLP schedule permits.     [name],M.S.,CCC-SLP 10/28/2023, 10:56 AM

## 2023-10-28 NOTE — Assessment & Plan Note (Addendum)
 Continue blood pressure control with metoprolol  and resume lisinopril .

## 2023-10-29 ENCOUNTER — Other Ambulatory Visit: Payer: Self-pay | Admitting: Family Medicine

## 2023-10-29 DIAGNOSIS — I639 Cerebral infarction, unspecified: Secondary | ICD-10-CM | POA: Diagnosis not present

## 2023-10-29 DIAGNOSIS — I5030 Unspecified diastolic (congestive) heart failure: Secondary | ICD-10-CM | POA: Diagnosis not present

## 2023-10-29 DIAGNOSIS — I10 Essential (primary) hypertension: Secondary | ICD-10-CM | POA: Diagnosis not present

## 2023-10-29 DIAGNOSIS — I480 Paroxysmal atrial fibrillation: Secondary | ICD-10-CM | POA: Diagnosis not present

## 2023-10-29 DIAGNOSIS — E1159 Type 2 diabetes mellitus with other circulatory complications: Secondary | ICD-10-CM

## 2023-10-29 LAB — BASIC METABOLIC PANEL WITH GFR
Anion gap: 6 (ref 5–15)
BUN: 10 mg/dL (ref 8–23)
CO2: 27 mmol/L (ref 22–32)
Calcium: 8.6 mg/dL — ABNORMAL LOW (ref 8.9–10.3)
Chloride: 108 mmol/L (ref 98–111)
Creatinine, Ser: 0.53 mg/dL — ABNORMAL LOW (ref 0.61–1.24)
GFR, Estimated: >60 mL/min (ref >60)
Glucose, Bld: 167 mg/dL — ABNORMAL HIGH (ref 70–99)
Potassium: 3.4 mmol/L — ABNORMAL LOW (ref 3.5–5.1)
Sodium: 141 mmol/L (ref 135–145)

## 2023-10-29 LAB — GLUCOSE, CAPILLARY
Glucose-Capillary: 144 mg/dL — ABNORMAL HIGH (ref 70–99)
Glucose-Capillary: 169 mg/dL — ABNORMAL HIGH (ref 70–99)
Glucose-Capillary: 234 mg/dL — ABNORMAL HIGH (ref 70–99)

## 2023-10-29 LAB — MAGNESIUM: Magnesium: 1.8 mg/dL (ref 1.7–2.4)

## 2023-10-29 MED ORDER — POTASSIUM CHLORIDE CRYS ER 20 MEQ PO TBCR
40.0000 meq | EXTENDED_RELEASE_TABLET | ORAL | Status: AC
  Administered 2023-10-29 (×2): 40 meq via ORAL
  Filled 2023-10-29 (×2): qty 2

## 2023-10-29 NOTE — Progress Notes (Signed)
 Physical Therapy Treatment Patient Details Name: Jonathan Gilmore MRN: [id number] DOB: [birth year] Today's Date: 10/29/2023   History of Present Illness Jonathan Gilmore is a 73 y.o. male with medical history significant of HTN, IIDM, HLD, PAF on Eliquis  coming in with new onset of vertigo.       Symptoms started about noontime today, patient was working in his backyard and started to feel spinning sensations and legs about to give up.  But denied any vision problems or any weakness numbness of any of the limbs, 1 hour later, a neighbor passed by and talk to him and decided to call EMS.  Patient said that he has a history of of A-fib, however 1 week ago he stopped taking Eliquis  I totally forgot about the medication.  EMS arrived and found patient in rapid A-fib, heart rate 170s, and patient had nausea with vomiting and heart rate, converted back to normal sinus rhythm.  ED workup MRI showed right cerebellar stroke with swelling and slight mass effect on the fourth ventricle.    PT Comments  Patient agreeable for therapy and only c/o mild dizziness, but not affecting functional activities. Patient presents with improvement in balance and able to ambulate in room and hallway without AD, but has occasional staggering to the right requiring tactile assistance to avoid loss of balance, safer using cane with mostly step-to pattern without loss of balance and states his wife can be available to assist 24 hours a day if needed. Patient requested to go back to bed after therapy. Patient will benefit from continued skilled physical therapy in hospital and recommended venue below to increase strength, balance, endurance for safe ADLs and gait.     If plan is discharge home, recommend the following: A little help with walking and/or transfers;Help with stairs or ramp for entrance;Assistance with cooking/housework   Can travel by private vehicle        Equipment Recommendations  None recommended by PT     Recommendations for Other Services       Precautions / Restrictions Precautions Precautions: Fall Recall of Precautions/Restrictions: Intact Restrictions Weight Bearing Restrictions Per Provider Order: No     Mobility  Bed Mobility Overal bed mobility: Independent                  Transfers Overall transfer level: Needs assistance Equipment used: None, Straight cane Transfers: Sit to/from Stand, Bed to chair/wheelchair/BSC Sit to Stand: Modified independent (Device/Increase time), Supervision   Step pivot transfers: Supervision, Modified independent (Device/Increase time)       General transfer comment: has to lean on nearby objects for support without AD, good return for using [facility]    Ambulation/Gait Ambulation/Gait assistance: Supervision, Contact guard assist Gait Distance (Feet): 100 Feet Assistive device: Straight cane, 1 person hand held assist Gait Pattern/deviations: Ataxic, Staggering right, Step-to pattern Gait velocity: slightly decreased     General Gait Details: slightly unsteady movement with occasional staggering to the right requiring tactile assistance to avoid loss of balance when ambulating without AD, safer using cane with mostly step-to pattern without loss of balance, fair/good return for making 360 turns using cane   Stairs             Wheelchair Mobility     Tilt Bed    Modified Rankin (Stroke Patients Only)       Balance Overall balance assessment: Needs assistance Sitting-balance support: Feet unsupported, No upper extremity supported Sitting balance-Leahy Scale: Good Sitting balance - Comments: seated at  EOB   Standing balance support: During functional activity, No upper extremity supported Standing balance-Leahy Scale: Fair Standing balance comment: fair/good using cane                            Communication Communication Communication: No apparent difficulties  Cognition Arousal: Alert Behavior  During Therapy: WFL for tasks assessed/performed                             Following commands: Intact      Cueing Cueing Techniques: Verbal cues  Exercises      General Comments        Pertinent Vitals/Pain Pain Assessment Pain Assessment: No/denies pain    Home Living                          Prior Function            PT Goals (current goals can now be found in the care plan section) Acute Rehab PT Goals Patient Stated Goal: return home PT Goal Formulation: With patient Potential to Achieve Goals: Good Progress towards PT goals: Progressing toward goals    Frequency    Min 3X/week      PT Plan      Co-evaluation              AM-PAC PT 6 Clicks Mobility   Outcome Measure  Help needed turning from your back to your side while in a flat bed without using bedrails?: None Help needed moving from lying on your back to sitting on the side of a flat bed without using bedrails?: None Help needed moving to and from a bed to a chair (including a wheelchair)?: None Help needed standing up from a chair using your arms (e.g., wheelchair or bedside chair)?: None Help needed to walk in hospital room?: A Little Help needed climbing 3-5 steps with a railing? : A Lot 6 Click Score: 21    End of Session Equipment Utilized During Treatment: Gait belt Activity Tolerance: Patient tolerated treatment well Patient left: in bed;with call bell/phone within reach Nurse Communication: Mobility status PT Visit Diagnosis: Unsteadiness on feet (R26.81);Other abnormalities of gait and mobility (R26.89);Muscle weakness (generalized) (M62.81)     Time: 9064-8998 PT Time Calculation (min) (ACUTE ONLY): 26 min  Charges:    $Gait Training: 23-37 mins PT General Charges $$ ACUTE PT VISIT: 1 Visit                     10:22 AM, 10/29/23 Lynwood Music, MPT Physical Therapist with [facility] [phone] office [phone] mobile  phone

## 2023-10-29 NOTE — Progress Notes (Signed)
 Progress Note   Patient: Jonathan Gilmore FMW:[id number] DOB: 1950-10-02 DOA: 10/27/2023     1 DOS: the patient was seen and examined on 10/29/2023   Brief hospital course: Mr. Boettcher was admitted to the hospital with the working diagnosis of acute cerebrovascular accident.   73 yo male with the past medical history of hypertension, hyperlipidemia, T2DM, paroxysmal atrial fibrillation who presented with vertigo.   Acute onset of symptoms, while working in is backyard. Apparently has not been taking apixaban  for over one week. EMS was called he was found in atrial fibrillation with RVR and was transported to the ED. On his initial physical examination his blood pressure was 138/69, HR 83, RR 23 and 02 saturation 97%, lungs with no wheezing or rhonchi, heart with S1 and S2 present and tachycardic, abdomen with no distention and no lower extremity edema. Neurologically he was non focal.   Brain MRI MRA with new area of restricted diffusion medially within the right cerebellar hemisphere with mild cerebral edema and partial effacement of the fourth ventricle.  Mild periventricular and deep cerebral white matter disease.   CT angiography with acute right posterior inferior cerebellar artery infarct with diffusion hyperintensity in the distal medial cerebellum and slight mass effect on the fourth ventricle.  Complete occlusion of the V1 and V2 segments of the right vertebral artery with patent V3 segment.  Moderate to severe multifocal stenosis bilaterally in the vertebral arteries.  Abrupt occlusion or narrowing of the distal P2 segment of the right posterior cerebral artery.  Moderate calcific atheromatous disease in the aortic arch ad carotid arteries with mild to moderate stenosis in the right internal carotid artery and moderate stenosis in the left internal carotid artery.   Assessment and Plan: Acute CVA (cerebrovascular accident) (HCC) Clinically improving but not yet back to baseline.    Continue apixaban  for anticoagulation.  Continue blood pressure control and statin.  If worsening symptoms recommended repeat stat head CT, discussed with Dr Shelton from neurology. Possible transfer to CIR.   Paroxysmal atrial fibrillation (HCC) Continue anticoagulation with apixaban .  Rate control with metoprolol  succinate 50 mg po daily.   Essential hypertension Continue blood pressure control with metoprolol .   Diastolic CHF (HCC) No signs of acute decompensation  Continue metoprolol  succinate  Type 2 diabetes mellitus with hyperlipidemia (HCC) Continue glucose cover and monitoring with insulin  sliding scale.  Glucose has been controlled  Continue with statin therapy   GERD (gastroesophageal reflux disease) Continue with pantoprazole    Tobacco dependence Smoking cessation counseling   Iron deficiency Sable hgb and hct   Hypokalemia Hypomagnesemia.   Patient has received K and Mg.  Will follow up renal function and electrolytes.       Subjective: patient with improvement in dizziness and vertigo, but not yet back to baseline, no dyspnea or chest pain  Physical Exam: Vitals:   10/28/23 2026 10/29/23 0518 10/29/23 0835 10/29/23 1543  BP: 138/69 (!) 143/79 139/82 130/64  Pulse: 86 81 84 82  Resp: 20 18 17 20   Temp: 98 F (36.7 C) 97.8 F (36.6 C) 98.5 F (36.9 C) 98.3 F (36.8 C)  TempSrc: Oral  Oral Oral  SpO2: 96% 95% 97% 97%  Weight:      Height:       Neurology awake and alert ENT with mild pallor  Cardiovascular with S1 and S2 present, irregularly irregular with no gallops or murmurs Respiratory with no rales or wheezing Abdomen with no distention  No lower extremity edema  Data Reviewed:    Family Communication: no family at the bedside, I was not able to reach his wife over the phone   Disposition: Status is: Inpatient Remains inpatient appropriate because: neurologic recovery   Planned Discharge Destination:  Rehab    Author: Elidia Toribio Furnace, MD 10/29/2023 6:04 PM  For on call review [email or web address].

## 2023-10-29 NOTE — Plan of Care (Signed)
  Problem: Coping: Goal: Ability to adjust to condition or change in health will improve Outcome: Progressing   Problem: Fluid Volume: Goal: Ability to maintain a balanced intake and output will improve Outcome: Progressing   Problem: Health Behavior/Discharge Planning: Goal: Ability to identify and utilize available resources and services will improve Outcome: Progressing Goal: Ability to manage health-related needs will improve Outcome: Progressing   Problem: Metabolic: Goal: Ability to maintain appropriate glucose levels will improve Outcome: Progressing   Problem: Nutritional: Goal: Maintenance of adequate nutrition will improve Outcome: Progressing Goal: Progress toward achieving an optimal weight will improve Outcome: Progressing   Problem: Skin Integrity: Goal: Risk for impaired skin integrity will decrease Outcome: Progressing   Problem: Tissue Perfusion: Goal: Adequacy of tissue perfusion will improve Outcome: Progressing   Problem: Ischemic Stroke/TIA Tissue Perfusion: Goal: Complications of ischemic stroke/TIA will be minimized Outcome: Progressing   Problem: Coping: Goal: Will verbalize positive feelings about self Outcome: Progressing Goal: Will identify appropriate support needs Outcome: Progressing   Problem: Self-Care: Goal: Ability to participate in self-care as condition permits will improve Outcome: Progressing Goal: Verbalization of feelings and concerns over difficulty with self-care will improve Outcome: Progressing Goal: Ability to communicate needs accurately will improve Outcome: Progressing   Problem: Nutrition: Goal: Risk of aspiration will decrease Outcome: Progressing Goal: Dietary intake will improve Outcome: Progressing   Problem: Education: Goal: Knowledge of General Education information will improve Description: Including pain rating scale, medication(s)/side effects and non-pharmacologic comfort measures Outcome:  Progressing   Problem: Health Behavior/Discharge Planning: Goal: Ability to manage health-related needs will improve Outcome: Progressing   Problem: Clinical Measurements: Goal: Ability to maintain clinical measurements within normal limits will improve Outcome: Progressing Goal: Will remain free from infection Outcome: Progressing Goal: Diagnostic test results will improve Outcome: Progressing Goal: Respiratory complications will improve Outcome: Progressing Goal: Cardiovascular complication will be avoided Outcome: Progressing   Problem: Activity: Goal: Risk for activity intolerance will decrease Outcome: Progressing   Problem: Nutrition: Goal: Adequate nutrition will be maintained Outcome: Progressing   Problem: Coping: Goal: Level of anxiety will decrease Outcome: Progressing   Problem: Elimination: Goal: Will not experience complications related to bowel motility Outcome: Progressing Goal: Will not experience complications related to urinary retention Outcome: Progressing   Problem: Pain Managment: Goal: General experience of comfort will improve and/or be controlled Outcome: Progressing   Problem: Safety: Goal: Ability to remain free from injury will improve Outcome: Progressing   Problem: Skin Integrity: Goal: Risk for impaired skin integrity will decrease Outcome: Progressing

## 2023-10-29 NOTE — Plan of Care (Signed)

## 2023-10-30 DIAGNOSIS — I10 Essential (primary) hypertension: Secondary | ICD-10-CM | POA: Diagnosis not present

## 2023-10-30 DIAGNOSIS — I5032 Chronic diastolic (congestive) heart failure: Secondary | ICD-10-CM | POA: Diagnosis not present

## 2023-10-30 DIAGNOSIS — E876 Hypokalemia: Secondary | ICD-10-CM

## 2023-10-30 DIAGNOSIS — I639 Cerebral infarction, unspecified: Secondary | ICD-10-CM | POA: Diagnosis not present

## 2023-10-30 DIAGNOSIS — I480 Paroxysmal atrial fibrillation: Secondary | ICD-10-CM | POA: Diagnosis not present

## 2023-10-30 LAB — BASIC METABOLIC PANEL WITH GFR
Anion gap: 6 (ref 5–15)
BUN: 9 mg/dL (ref 8–23)
CO2: 26 mmol/L (ref 22–32)
Calcium: 8.7 mg/dL — ABNORMAL LOW (ref 8.9–10.3)
Chloride: 107 mmol/L (ref 98–111)
Creatinine, Ser: 0.55 mg/dL — ABNORMAL LOW (ref 0.61–1.24)
GFR, Estimated: >60 mL/min (ref >60)
Glucose, Bld: 144 mg/dL — ABNORMAL HIGH (ref 70–99)
Potassium: 3.6 mmol/L (ref 3.5–5.1)
Sodium: 139 mmol/L (ref 135–145)

## 2023-10-30 LAB — GLUCOSE, CAPILLARY
Glucose-Capillary: 144 mg/dL — ABNORMAL HIGH (ref 70–99)
Glucose-Capillary: 173 mg/dL — ABNORMAL HIGH (ref 70–99)
Glucose-Capillary: 220 mg/dL — ABNORMAL HIGH (ref 70–99)

## 2023-10-30 MED ORDER — METFORMIN HCL 500 MG PO TABS
500.0000 mg | ORAL_TABLET | Freq: Every day | ORAL | Status: DC
  Administered 2023-10-30: 500 mg via ORAL
  Filled 2023-10-30: qty 1

## 2023-10-30 MED ORDER — NICOTINE 21 MG/24HR TD PT24
21.0000 mg | MEDICATED_PATCH | Freq: Every day | TRANSDERMAL | Status: DC
  Filled 2023-10-30: qty 1

## 2023-10-30 MED ORDER — METFORMIN HCL 500 MG PO TABS: 500.0000 mg | ORAL_TABLET | Freq: Every day | ORAL | 0 refills | Status: DC

## 2023-10-30 MED ORDER — APIXABAN 5 MG PO TABS: 5.0000 mg | ORAL_TABLET | Freq: Two times a day (BID) | ORAL | 0 refills | Status: DC

## 2023-10-30 MED ORDER — NICOTINE 21 MG/24HR TD PT24: 21.0000 mg | MEDICATED_PATCH | Freq: Every day | TRANSDERMAL | 0 refills | Status: DC

## 2023-10-30 MED ORDER — POTASSIUM CHLORIDE CRYS ER 20 MEQ PO TBCR
40.0000 meq | EXTENDED_RELEASE_TABLET | Freq: Once | ORAL | Status: AC
  Administered 2023-10-30: 40 meq via ORAL
  Filled 2023-10-30: qty 2

## 2023-10-30 MED ORDER — LISINOPRIL 20 MG PO TABS: 20.0000 mg | ORAL_TABLET | Freq: Every day | ORAL | 0 refills | Status: DC

## 2023-10-30 NOTE — TOC Transition Note (Signed)
 Transition of Care [facility]) - Discharge Note  Patient Details  Name: BRAXEN DOBEK MRN: [id number] Date of Birth: 11-20-1950  Transition of Care [facility]) CM/SW Contact:  Nena LITTIE Coffee, RN Phone Number: 10/30/2023, 11:52 AM  Clinical Narrative:    Pt's wife declined HH PT, OT. Referral made to [facility] on [facility]. [employer] delivered a front wheeled walker to the pt's room. Sticker c/pt's name placed on walker. Wife to provided transport home.   Final next level of care: Home/Self Care (Referral to [facility]) Barriers to Discharge: Barriers Resolved  Patient Goals and CMS Choice     Discharge Placement  Discharge Plan and Services Additional resources added to the After Visit Summary for          DME Arranged: [facility] DME Agency: [employer]   Social Drivers of Health (SDOH) Interventions SDOH Screenings   Food Insecurity: No Food Insecurity (10/27/2023)  Housing: Low Risk  (10/27/2023)  Transportation Needs: No Transportation Needs (10/27/2023)  Utilities: Not At Risk (10/27/2023)  Alcohol Screen: Low Risk  (11/17/2022)  Depression (PHQ2-9): Medium Risk (11/17/2022)  Financial Resource Strain: Low Risk  (11/17/2022)  Physical Activity: Inactive (11/17/2022)  Social Connections: Unknown (10/27/2023)  Stress: No Stress Concern Present (11/17/2022)  Tobacco Use: High Risk (10/27/2023)  Health Literacy: Adequate Health Literacy (11/17/2022)   Readmission Risk Interventions

## 2023-10-30 NOTE — Discharge Summary (Addendum)
 Physician Discharge Summary   Patient: Jonathan Gilmore MRN: [id number] DOB: 1950/10/22  Admit date:     10/27/2023  Discharge date: 10/30/23  Discharge Physician: Elidia Sieving Carita Sollars   PCP: Levora Reyes SAUNDERS, MD   Recommendations at discharge:    Patient has been placed on apixaban  for anticoagulation Resumed lisinopril  for blood pressure control. Added metformin  for T2DM.  Follow up renal function and electrolytes in 7 days as outpatient.  Smoking cessation, added nicotine  patch.  Follow up with Dr Levora in 7 to 10 days Follow up with Neurology as scheduled Home health services arranged. DME for rolling walker.   I spoke with patient's wife at the bedside, we talked in detail about patient's condition, plan of care and prognosis and all questions were addressed.   Discharge Diagnoses: Active Problems:   Acute CVA (cerebrovascular accident) (HCC)   Paroxysmal atrial fibrillation (HCC)   Essential hypertension   Diastolic CHF (HCC)   Type 2 diabetes mellitus with hyperlipidemia (HCC)   GERD (gastroesophageal reflux disease)   Tobacco dependence   Iron deficiency   Hypokalemia  Resolved Problems:   * No resolved hospital problems. [facility] Course: Jonathan Gilmore was admitted to the hospital with the working diagnosis of acute cerebrovascular accident.   73 yo male with the past medical history of hypertension, hyperlipidemia, T2DM, paroxysmal atrial fibrillation who presented with vertigo.   Acute onset of symptoms, while working in is backyard. Severe vertigo and sensation of legs giving up. Apparently has not been taking apixaban  for over one week. EMS was called he was found in atrial fibrillation with RVR and was transported to the ED. On his initial physical examination his blood pressure was 138/69, HR 83, RR 23 and 02 saturation 97%, lungs with no wheezing or rhonchi, heart with S1 and S2 present and tachycardic, systolic murmur at the base, abdomen with no distention  and no lower extremity edema. Neurologically he was non focal.   Na 140, K 3.1 Cl 100 bicarbonate 22 glucose 178, bun 10 cr 0,78  Mg 1,5  AST 26 ALT 16  Ck 54 High sensitive troponin 5 and 7  Wbc 10,4 hgb 14.1 plt 209  TSH 1,61 Hgb A1c 7.1   Chest radiograph with hypoinflation, positive cardiomegaly, calcified aortic notch, no infiltrates or effusions.   EKG 79 bpm, normal axis, normal intervals, qtc 425, sinus rhythm with poor RR wave progression, ST depression and T wave inversion in lead II, III, aVF, V 4 to V6  Brain MRI MRA with new area of restricted diffusion medially within the right cerebellar hemisphere with mild cerebral edema and partial effacement of the fourth ventricle.  Mild periventricular and deep cerebral white matter disease.   CT angiography with acute right posterior inferior cerebellar artery infarct with diffusion hyperintensity in the distal medial cerebellum and slight mass effect on the fourth ventricle.  Complete occlusion of the V1 and V2 segments of the right vertebral artery with patent V3 segment.  Moderate to severe multifocal stenosis bilaterally in the vertebral arteries.  Abrupt occlusion or narrowing of the distal P2 segment of the right posterior cerebral artery.  Moderate calcific atheromatous disease in the aortic arch ad carotid arteries with mild to moderate stenosis in the right internal carotid artery and moderate stenosis in the left internal carotid artery.   Patient was resumed on anticoagulation with apixaban  and had frequent neuro checks.  PT, OT and neurology consultation.   Initially deemed candidate for CIR.  08/16  improved mobility with PT  08/17 patient going home with home health services.   Assessment and Plan: Acute CVA (cerebrovascular accident) [facility]) Neuro deficit has been improving, initially recommended CIR, but fortunately continue to improve and now deemed stable to go home with home health services.   Continue apixaban   for anticoagulation.  Continue blood pressure control and statin.  Follow up with neurology as outpatient.   Paroxysmal atrial fibrillation (HCC) Continue anticoagulation with apixaban .  Rate control with metoprolol  succinate 50 mg po daily.   Essential hypertension Continue blood pressure control with metoprolol  and resume lisinopril .   Chronic diastolic CHF (congestive heart failure) (HCC) Echocardiogram with preserved LV systolic function with EF 65 to 70%, no regional wall motion abnormalities, moderate LVH, RV systolic function is preserved, moderate dilatation LA, RA with normal size, mild mitral valve regurgitation, severe aortic valve stenosis. Atrial level shunting with moderate sized secundum atrial septal defect with predominant left to right shunting across the atrial septum.   No signs of acute decompensation  Continue metoprolol  succinate and resume lisinopril .  As needed loop diuretic with furosemide .   Type 2 diabetes mellitus with hyperlipidemia (HCC) Patient was placed on insulin  sliding scale for glucose cover and monitoring  Glucose has been controlled At discharge added metformin .   Continue with statin therapy   Hypokalemia Hypomagnesemia.   At the time of  his discharge his serum cr is 0,55 with K at 3,6 and serum bicarbonate at 26  Na 139  Mg 1.8   Patient will have 40 meq Kcl prior to his discharge.  Follow up renal function and electrolytes as outpatient.   Tobacco dependence Smoking cessation counseling  Added nicotine  patch.   Iron deficiency Sable hgb and hct   GERD (gastroesophageal reflux disease) Continue with pantoprazole        Consultants: neurology  Procedures performed: none   Disposition: Home Diet recommendation:  Cardiac and Carb modified diet DISCHARGE MEDICATION: Allergies as of 10/30/2023       Reactions   Morphine And Codeine Nausea And Vomiting        Medication List     STOP taking these medications     aspirin  EC 81 MG tablet       TAKE these medications    Accu-Chek Aviva Plus test strip Generic drug: glucose blood USE AS INSTRUCTED   apixaban  5 MG Tabs tablet Commonly known as: ELIQUIS  Take 1 tablet (5 mg total) by mouth 2 (two) times daily.   atorvastatin  80 MG tablet Commonly known as: LIPITOR  Take 1 tablet (80 mg total) by mouth daily.   CAL-MAG-ZINC PO Take 1 tablet by mouth daily.   furosemide  40 MG tablet Commonly known as: LASIX  Take 1 tablet (40 mg total) by mouth as needed for edema (swelling).   gabapentin  300 MG capsule Commonly known as: NEURONTIN  TAKE 1 TO 2 CAPSULES BY MOUTH  TWICE DAILY   lisinopril  20 MG tablet Commonly known as: ZESTRIL  Take 1 tablet (20 mg total) by mouth daily.   metFORMIN  500 MG tablet Commonly known as: GLUCOPHAGE  Take 1 tablet (500 mg total) by mouth daily with breakfast. Start taking on: October 31, 2023 What changed:  how much to take when to take this additional instructions   metoprolol  succinate 50 MG 24 hr tablet Commonly known as: TOPROL -XL TAKE 1 TABLET BY MOUTH DAILY What changed: when to take this   multivitamin with minerals Tabs tablet Take 1 tablet by mouth daily.   nitroGLYCERIN  0.4 MG SL  tablet Commonly known as: NITROSTAT  Place 1 tablet (0.4 mg total) under the tongue every 5 (five) minutes x 3 doses as needed for chest pain (if no relief after 2nd dose, proceed to ED for an evaluation).   OMEGA 3 FISH OIL PO Take 1 capsule by mouth daily.   pantoprazole  40 MG tablet Commonly known as: PROTONIX  Take 1 tablet (40 mg total) by mouth 2 (two) times daily.        Discharge Exam: Filed Weights   10/27/23 1431  Weight: 81.6 kg   BP 138/79   Pulse 86   Temp 98 F (36.7 C) (Oral)   Resp 17   Ht 5' 7 (1.702 m)   Wt 81.6 kg   SpO2 98%   BMI 28.19 kg/m   Patient is feeling better, his balance continue to improve, he has been using a rolling walker   Neurology awake and alert ENT  with no pallor or icterus  Cardiovascular with S1 and S2 present and regular, with no gallops, or rubs, positive systolic murmur at the base, crescendo decrescendo  Respiratory with no rales or wheezing, no rhonchi  Abdomen with no distention No lower extremity edema   Condition at discharge: stable  The results of significant diagnostics from this hospitalization (including imaging, microbiology, ancillary and laboratory) are listed below for reference.   Imaging Studies:   Microbiology: Results for orders placed or performed in visit on 09/26/23  Microscopic Examination     Status: None   Collection Time: 09/26/23  3:35 PM   Urine  Result Value Ref Range Status   WBC, UA None seen 0 - 5 /hpf Final   RBC, Urine 0-2 0 - 2 /hpf Final   Epithelial Cells (non renal) 0-10 0 - 10 /hpf Final   Bacteria, UA None seen None seen/Few Final    Labs: CBC: Recent Labs  Lab 10/27/23 1513  WBC 10.4  NEUTROABS 7.6  HGB 14.1  HCT 42.9  MCV 98.4  PLT 209   Basic Metabolic Panel: Recent Labs  Lab 10/27/23 1513 10/28/23 0455 10/28/23 1745 10/29/23 0500 10/30/23 0645  NA 140  --  139 141 139  K 3.1*  --  3.7 3.4* 3.6  CL 100  --  103 108 107  CO2 22  --  26 27 26   GLUCOSE 178*  --  204* 167* 144*  BUN 10  --  10 10 9   CREATININE 0.78  --  0.63 0.53* 0.55*  CALCIUM  8.9  --  8.7* 8.6* 8.7*  MG 1.5* 1.9  --  1.8  --    Liver Function Tests: Recent Labs  Lab 10/27/23 1513  AST 26  ALT 16  ALKPHOS 109  BILITOT 1.7*  PROT 7.0  ALBUMIN 3.8   CBG: Recent Labs  Lab 10/29/23 0714 10/29/23 1113 10/29/23 1612 10/30/23 0010 10/30/23 0716  GLUCAP 144* 169* 234* 220* 144*    Discharge time spent: greater than 30 minutes.  Signed: Elidia Toribio Furnace, MD [facility] 10/30/2023

## 2023-10-30 NOTE — Plan of Care (Signed)

## 2023-10-30 NOTE — Progress Notes (Signed)
 Inpatient Rehab Admissions Coordinator:    Note PT changed recommendations to outpatient. Pt. And wife are on board with this. CIR will sign off.   Leita Kleine, MS, CCC-SLP Rehab Admissions Coordinator  [phone] (celll) [phone] (office)

## 2023-10-31 ENCOUNTER — Encounter (HOSPITAL_COMMUNITY): Admitting: Occupational Therapy

## 2023-10-31 ENCOUNTER — Telehealth: Payer: Self-pay | Admitting: *Deleted

## 2023-10-31 ENCOUNTER — Telehealth: Payer: Self-pay

## 2023-10-31 ENCOUNTER — Ambulatory Visit (HOSPITAL_COMMUNITY)

## 2023-10-31 NOTE — Transitions of Care (Post Inpatient/ED Visit) (Signed)
 10/31/2023  Name: Jonathan Gilmore MRN: [id number] DOB: 1950/05/15  Today's TOC FU Call Status: Today's TOC FU Call Status:: Successful TOC FU Call Completed TOC FU Call Complete Date: 10/31/23 Patient's Name and Date of Birth confirmed.  Transition Care Management Follow-up Telephone Call Date of Discharge: 10/30/23 [facility] Type of Discharge: Inpatient Admission Primary Inpatient Discharge Diagnosis:: Acute CVA How have you been since you were released from the hospital?: Better (I am better; legs still feel a little weak, but I am not having to use the walker- I am using a walking stick that I had here at the house.  My wife is handling all of the medications for now- she is not here to review them; I am taking them all) Any questions or concerns?: No  Items Reviewed: Did you receive and understand the discharge instructions provided?: Yes (thoroughly reviewed with patient who verbalizes good understanding of same) Medications obtained,verified, and reconciled?: Partial Review Completed (Partial medication review completed; confirmed patient obtained/ is taking all newly Rx'd medications as instructed; spouse-manages medications- patient denies questions/ concerns around medications today) Reason for Partial Mediation Review: Spouse is managing medications post-hospital discharge; spouse is not present to review Any new allergies since your discharge?: No Dietary orders reviewed?: Yes Type of Diet Ordered:: I try to eat smart and healthy Do you have support at home?: Yes People in Home [RPT]: spouse Name of Support/Comfort Primary Source: Reports independent in self-care activities; resides with supportive spouse- assists as/ if needed/ indicated  Medications Reviewed Today: Medications Reviewed Today     Reviewed by Xaria Judon M, RN (Registered Nurse) on 10/31/23 at 1614  Med List Status: <None>   Medication Order Taking? Sig Documenting Provider  Last Dose Status Informant  ACCU-CHEK AVIVA PLUS test strip [id number] Yes USE AS INSTRUCTED Corum, Olam CROME, MD  Active Spouse/Significant Other, Pharmacy Records  apixaban  (ELIQUIS ) 5 MG TABS tablet [id number] Yes Take 1 tablet (5 mg total) by mouth 2 (two) times daily. Arrien, Elidia Sieving, MD  Active   atorvastatin  (LIPITOR ) 80 MG tablet [id number]  Take 1 tablet (80 mg total) by mouth daily. Alvan Dorn FALCON, MD  Active Spouse/Significant Other, Pharmacy Records  Calcium -Magnesium -Zinc (CAL-MAG-ZINC PO) [id number]  Take 1 tablet by mouth daily. [provider]  Active Spouse/Significant Other, Pharmacy Records  furosemide  (LASIX ) 40 MG tablet [id number] Yes Take 1 tablet (40 mg total) by mouth as needed for edema (swelling).  Patient taking differently: Take 40 mg by mouth as needed for edema (swelling). 10/31/23: Reports during TOC call, has been taking every day   Miriam Norris, NP  Active Spouse/Significant Other, Pharmacy Records  gabapentin  (NEURONTIN ) 300 MG capsule [id number]  TAKE 1 TO 2 CAPSULES BY MOUTH  TWICE DAILY Levora Reyes SAUNDERS, MD  Active Spouse/Significant Other, Pharmacy Records  lisinopril  (ZESTRIL ) 20 MG tablet [id number]  Take 1 tablet (20 mg total) by mouth daily. Arrien, Mauricio Daniel, MD  Active   metFORMIN  (GLUCOPHAGE ) 500 MG tablet [id number] Yes Take 1 tablet (500 mg total) by mouth daily with breakfast. Arrien, Mauricio Daniel, MD  Active   metoprolol  succinate (TOPROL -XL) 50 MG 24 hr tablet [id number]  TAKE 1 TABLET BY MOUTH DAILY  Patient taking differently: Take 50 mg by mouth every evening.   Alvan Dorn FALCON, MD  Active Spouse/Significant Other, Pharmacy Records  Multiple Vitamin (MULTIVITAMIN WITH MINERALS) TABS [id number]  Take 1 tablet by mouth daily. [provider]  Active Spouse/Significant Other, Pharmacy Records  nicotine  (  NICODERM CQ  - DOSED IN MG/24 HOURS) 21 mg/24hr patch [id number] Yes Place 1 patch (21 mg total) onto the skin daily.  Arrien, Mauricio Daniel, MD  Active   nitroGLYCERIN  (NITROSTAT ) 0.4 MG SL tablet [id number]  Place 1 tablet (0.4 mg total) under the tongue every 5 (five) minutes x 3 doses as needed for chest pain (if no relief after 2nd dose, proceed to ED for an evaluation). Alvan Dorn FALCON, MD  Active Spouse/Significant Other, Pharmacy Records  Omega-3 Fatty Acids (OMEGA 3 FISH OIL PO) [id number]  Take 1 capsule by mouth daily. [provider]  Active   pantoprazole  (PROTONIX ) 40 MG tablet [id number]  Take 1 tablet (40 mg total) by mouth 2 (two) times daily. Mariette Mitzie CROME, NP  Active Spouse/Significant Other, Pharmacy Records           Home Care and Equipment/Supplies: Were Home Health Services Ordered?: No (confirmed referral for outpatient PT has been placed post-hospital discharge) Any new equipment or medical supplies ordered?: Yes (walker) Name of Medical supply agency?: [employer] Were you able to get the equipment/medical supplies?: Yes Do you have any questions related to the use of the equipment/supplies?: No  Functional Questionnaire: Do you need assistance with bathing/showering or dressing?: No Do you need assistance with meal preparation?: No Do you need assistance with eating?: No Do you have difficulty maintaining continence: No Do you need assistance with getting out of bed/getting out of a chair/moving?: No Do you have difficulty managing or taking your medications?: Yes (spouse currently assisting with medication management/ supervision post-hospital discharge)  Follow up appointments reviewed: PCP Follow-up appointment confirmed?: Yes Date of PCP follow-up appointment?: 11/02/23 Follow-up Provider: PCP- Dr. Levora [facility] Follow-up appointment confirmed?: NA (verified not indicated per hospital discharging provider discharge notes) Do you need transportation to your follow-up appointment?: No Do you understand care options if your condition(s) worsen?:  Yes-patient verbalized understanding  SDOH Interventions Today    Flowsheet Row Most Recent Value  SDOH Interventions   Food Insecurity Interventions Intervention Not Indicated  Housing Interventions Intervention Not Indicated  Transportation Interventions Intervention Not Indicated  [drives self at baseline,  spouse assisting post- hospital discharge on 10/30/23]  Utilities Interventions Intervention Not Indicated   See TOC assessment tabs for additional assessment/ TOC intervention information  Successfully enrolled into 30-day TOC program-- provided my direct contact information should questions/ concerns/ needs arise post-TOC call, prior to next RN CM telephone visit    Pls call/ message for questions,  Bayle Calvo Mckinney Lunabelle Oatley, RN, BSN, Media planner  Transitions of Care  [facility] - [facility] [phone]: direct office

## 2023-10-31 NOTE — Patient Instructions (Addendum)
 Visit Information  Thank you for taking time to visit with me today. Please don't hesitate to contact me if I can be of assistance to you before our next scheduled telephone appointment.  Our next appointment is by telephone on Wednesday, November 09, 2023 at 11:15 am   Please call the care guide team at [phone] if you need to cancel or reschedule your appointment.   Patient Self Care Activities:  Attend all scheduled provider appointments Call provider office for new concerns or questions  Participate in Transition of Care Program/Attend TOC scheduled calls Take medications as prescribed   Continue pacing activity as your recuperation from recent surgery continues Weigh yourself every day to stay on top of early fluid retention: write down your weights every day so you remember what it is from day to day: follow the weight-gain guidelines and action plan to call your doctor if you gain more than 3 lbs overnight, or 5 lbs in one week Continue monitoring and recording your blood pressures, daily weights, and blood sugars at home-- take all recorded values from home monitoring to upcoming PCP office visit for provider review Use assistive devices as needed to prevent falls- your walking stick and/ or your new walker Please go over all of your medications with your care providers /doctors Eat a heart healthy and low salt diet If you believe your condition is getting worse- contact your care providers (doctors) promptly- reaching out to your doctor early when you have concerns can prevent you from having to go to the hospital  Following is a copy of your care plan:   Goals Addressed             This Visit's Progress    [facility] Transitions of Care (TOC) Care Plan   On track    Problems:  Recent Hospitalization for treatment of CVA Recent hospital admission for CVA Has planned transfer of care from PCP to new PCP:  moving from [facility] to [employer]: has  appointment to establish care with new PCP on 11/25/23 Confirmed has hospital follow up office visit with established PCP scheduled 11/02/23 with plans to attend as scheduled Hospital admission August 14-17, 2025 for acute CVA without deficit Independent at baseline; supportive assisting as indicated post- recent hospital discharge (1) unplanned hospital admission x last (6) and (12) months  Goal:  Over the next 30 days, the patient will not experience hospital readmission  Interventions:  Transitions of Care: week # 1/ day # 1 Durable Medical Equipment (DME) needs assessed with patient/caregiver Doctor Visits  - discussed the importance of doctor visits Communication with PCP re: successful enrollment into [facility] 30-day program Post discharge activity limitations prescribed by provider reviewed Confirmed currently requiring/ using assistive devices for ambulation - has new walker, but reports doesn't need; currently using walking stick Provided education/ reinforcement around fall prevention  Confirmed patient is NOT consistently obtaining daily weights/ blood pressures at home: provided education / rationale for daily weight monitoring at home as earliest indicator of fluid retention, along with weight gain guidelines/ action plan for weight gain; importance of taking diuretic as prescribed value/ benefit of monitoring/ recording blood pressures at home- encouraged patient to monitor and record both daily weights and blood pressures at home Advised patient to take all recorded weights/ blood pressures from home monitoring to upcoming PCP office visit for provider review Confirmed patient continues monitoring blood sugars at home - as per [facility] assessment Provided my direct contact information should questions/  concerns/ needs arise post-TOC initial call, prior to next [facility] 30-day program RN CM telephone visit    Stroke: Reviewed Importance of taking all medications as prescribed Reviewed  Importance of attending all scheduled provider appointments Advised to report any changes in symptoms or exercise tolerance Assessed social determinant of health barriers Assessed for signs and symptoms of stroke Assessed for cognitive impairment Assessed for fall status and safety in the home Assessed use of tobacco use Provided education around signs/ symptoms CVA (BEFAST) along with corresponding action plan: patient verbalizes fair understanding of same and will benefit from ongoing reinforcement around same; patient denies all symptoms today  Confirmed patient has started the process to quit smoking: reports he continues to just smoke a little bit, a puff every now and then; reinforced smoking cessation strategies; encouraged patient to discuss use of nicotene patches while still occasionally smoking with PCP at time of upcoming scheduled PCP office visit 11/02/23: for now- advised patient it is best practice to not use nicotene patches if he continues to smoke  Patient Self Care Activities:  Attend all scheduled provider appointments Call provider office for new concerns or questions  Participate in Transition of Care Program/Attend TOC scheduled calls Take medications as prescribed   Continue pacing activity as your recuperation from recent surgery continues Weigh yourself every day to stay on top of early fluid retention: write down your weights every day so you remember what it is from day to day: follow the weight-gain guidelines and action plan to call your doctor if you gain more than 3 lbs overnight, or 5 lbs in one week Continue monitoring and recording your blood pressures, daily weights, and blood sugars at home-- take all recorded values from home monitoring to upcoming PCP office visit for provider review Use assistive devices as needed to prevent falls- your walking stick and/ or your new walker Please go over all of your medications with your care providers /doctors Eat a  heart healthy and low salt diet If you believe your condition is getting worse- contact your care providers (doctors) promptly- reaching out to your doctor early when you have concerns can prevent you from having to go to the hospital  Plan:  Telephone follow up appointment with care management team member scheduled for:  11/09/23 at 11:15 am       Patient verbalizes understanding of instructions and care plan provided today and agrees to view in MyChart. Active MyChart status and patient understanding of how to access instructions and care plan via MyChart confirmed with patient.     If you are experiencing a Mental Health or Behavioral Health Crisis or need someone to talk to, please  call the Suicide and Crisis Lifeline: 988 call the [place]  National Suicide Prevention Lifeline: 234-472-0099 or TTY: (581) 130-8079 TTY 440-654-3961) to talk to a trained counselor call 1-800-273-TALK (toll free, 24 hour hotline) go to [facility] [address] [place] [phone]) call the [facility] Crisis Line: (803)252-5649 call 911   Beatris Blinda Lawrence, RN, BSN, CCRN Alumnus RN Care Manager  Transitions of Care  [facility] - Population Health  [place] [phone]: direct office

## 2023-10-31 NOTE — Telephone Encounter (Signed)
 Copied from CRM #[id number]. Topic: Referral - Request for Referral >> Oct 31, 2023 10:41 AM Thersia BROCKS wrote: Did the patient discuss referral with their provider in the last year? Yes (If No - schedule appointment) (If Yes - send message)  Appointment offered? Yes  Type of order/referral and detailed reason for visit: Physical Therapy   Preference of office, provider, location:  [facility] [address] [place],  [place]  [address]  Main: [phone]  If referral order, have you been seen by this specialty before? No (If Yes, this issue or another issue? When? Where?  Can we respond through MyChart? Yes

## 2023-10-31 NOTE — Telephone Encounter (Signed)
 Patient requesting a PT referral, I see 2 orders for this placed yesterday by Elidia JONETTA Furnace, MD one is closed and one was denied.  Should I defer back to this provider in regard to this referral?

## 2023-11-01 NOTE — Telephone Encounter (Signed)
 On further review of chart, it appears he was discharged recently with plan for home health. They should be providing PT, but please check ti see if they need a dedicated order for home health PT. Sometimes home health will also fax order request for adding on PT if already providing other services.

## 2023-11-01 NOTE — Telephone Encounter (Signed)
 I think if a previous order was placed, that should suffice.  I am not sure if the one that is closed is because it had been processed.  He does have an appointment with me tomorrow and we can look into this further if the other PT order is not sufficient.  Please check into that prior to his visit tomorrow.  Thanks

## 2023-11-01 NOTE — Telephone Encounter (Signed)
 Reviewing both referral one was denied because it was a duplicate. The other was closed because they could not reach patient to schedule. A new referral is needed to proceed.

## 2023-11-02 ENCOUNTER — Encounter (HOSPITAL_COMMUNITY): Admitting: Occupational Therapy

## 2023-11-02 ENCOUNTER — Ambulatory Visit: Admitting: Family Medicine

## 2023-11-02 VITALS — BP 108/60 | HR 79 | Temp 98.1°F | Resp 18 | Ht 67.0 in | Wt 173.4 lb

## 2023-11-02 DIAGNOSIS — I69398 Other sequelae of cerebral infarction: Secondary | ICD-10-CM | POA: Diagnosis not present

## 2023-11-02 DIAGNOSIS — R269 Unspecified abnormalities of gait and mobility: Secondary | ICD-10-CM | POA: Diagnosis not present

## 2023-11-02 DIAGNOSIS — Z8639 Personal history of other endocrine, nutritional and metabolic disease: Secondary | ICD-10-CM

## 2023-11-02 DIAGNOSIS — E1159 Type 2 diabetes mellitus with other circulatory complications: Secondary | ICD-10-CM

## 2023-11-02 DIAGNOSIS — D500 Iron deficiency anemia secondary to blood loss (chronic): Secondary | ICD-10-CM | POA: Diagnosis not present

## 2023-11-02 DIAGNOSIS — I480 Paroxysmal atrial fibrillation: Secondary | ICD-10-CM

## 2023-11-02 DIAGNOSIS — I10 Essential (primary) hypertension: Secondary | ICD-10-CM

## 2023-11-02 DIAGNOSIS — Z7984 Long term (current) use of oral hypoglycemic drugs: Secondary | ICD-10-CM

## 2023-11-02 DIAGNOSIS — I639 Cerebral infarction, unspecified: Secondary | ICD-10-CM

## 2023-11-02 LAB — BASIC METABOLIC PANEL WITH GFR
BUN: 12 mg/dL (ref 6–23)
CO2: 32 meq/L (ref 19–32)
Calcium: 9 mg/dL (ref 8.4–10.5)
Chloride: 97 meq/L (ref 96–112)
Creatinine, Ser: 0.61 mg/dL (ref 0.40–1.50)
GFR: 95.67 mL/min (ref >60.00)
Glucose, Bld: 210 mg/dL — ABNORMAL HIGH (ref 70–99)
Potassium: 4.2 meq/L (ref 3.5–5.1)
Sodium: 138 meq/L (ref 135–145)

## 2023-11-02 LAB — MAGNESIUM: Magnesium: 1.5 mg/dL (ref 1.5–2.5)

## 2023-11-02 LAB — CBC
HCT: 43 % (ref 39.0–52.0)
Hemoglobin: 14.4 g/dL (ref 13.0–17.0)
MCHC: 33.5 g/dL (ref 30.0–36.0)
MCV: 94.5 fl (ref 78.0–100.0)
Platelets: 248 K/uL (ref 150.0–400.0)
RBC: 4.55 Mil/uL (ref 4.22–5.81)
RDW: 14.6 % (ref 11.5–15.5)
WBC: 10.4 K/uL (ref 4.0–10.5)

## 2023-11-02 MED ORDER — METFORMIN HCL 500 MG PO TABS: 1000.0000 mg | ORAL_TABLET | Freq: Two times a day (BID) | ORAL | 1 refills | Status: AC

## 2023-11-02 MED ORDER — APIXABAN 5 MG PO TABS: 5.0000 mg | ORAL_TABLET | Freq: Two times a day (BID) | ORAL | 2 refills | Status: DC

## 2023-11-02 MED ORDER — LISINOPRIL 20 MG PO TABS: 20.0000 mg | ORAL_TABLET | Freq: Every day | ORAL | 1 refills | Status: AC

## 2023-11-02 NOTE — Patient Instructions (Addendum)
 Return to twice daily dosing of metformin  for improved blood sugar control.  No change in blood pressure or other meds today.  I referred you to neurology and physical therapy - let me know if you have not heard from them in next 2 weeks.   I do not recommend driving or operating tractor/mower until cleared to do so by physical therapy and neurology.   Keep up with the good work on avoiding cigarettes.  If any concerns on labs today I will let you know.  Hang in there!

## 2023-11-02 NOTE — Progress Notes (Signed)
 Subjective:  Patient ID: Jonathan Gilmore, male    DOB: 11-20-1950  Age: 73 y.o. MRN: [id number]  CC:  Chief Complaint  Patient presents with   Hospitalization Follow-up    Stroke 10/27/2023 ED visit, pt is doing okay, pt would like to know a timeline of progress to get better going forward     HPI Jonathan Gilmore presents for  Transition of care visit.  Here with spouse.  TOC phone call noted from 10/31/2023.  Enrolled in 30-day TOC program.  No acute needs identified. Plan for transition to [facility] 9/12 - closer to home.   Acute CVA Admitted  August 14 through August 17.  Discharge summary reviewed.  Acute onset of symptoms while working in his backyard with severe vertigo and sensation of legs giving up.  Had been off apixaban  for 1 week  (he notes he was off Eliquis  for 2 weeks - though may have been making him tired). EMS called, found to be in A-fib with RVR, transported to ED.  Reported nonfocal neurologic exam.  Brain MRI/MRA with new area of restricted diffusion medially within the right cerebellar hemisphere, mild cerebral edema and partial effacement of the fourth ventricle.  CT angiography with acute right posterior inferior cerebellar artery infarct.  Moderate to severe multifocal stenosis bilaterally in the vertebral arteries.Moderate calcific atheromatous disease and aortic arch, carotid arteries with mild to moderate stenosis in the right ICA and moderate stenosis in the left ICA.  He was resumed on anticoagulant apixaban , PT OT neuro consultation.  Initially deemed candidate for CIR, improve mobility with PT and subsequently went home with home health services planned.  Continued monitoring of blood pressure outpatient and neurology follow-up outpatient.  Does not have home health. Would like to undergo PT at outpatient rehab.  Still some balance issues. Prior R eye issues - improving. Using cane. No falls. Staggered per spouse, but no falls.  Taking eliquis   BID. No new weakness, no new headaches. No new neurologic symptoms since leaving hospital. Forgetful at times.    Atrial fibrillation Anticoagulated with apixaban  and rate control with metoprolol  50 mg/day. No new bleeding. Compliant with meds above. No recent palpitations/chest pains.   Chronic diastolic CHF Echo with preserved LV systolic function, EF 65 to 70% without regional wall motion abnormalities.  Was noted to have severe aortic stenosis, atrial septal defect left-to-right shunting, no signs of acute decompensation.  As needed loop diuretic with furosemide .  Continue on metoprolol  and lisinopril .  On furosemide  daily.  Followed by cardiology - Dr. Alvan. Working on timing for Presenter, broadcasting. Appt with cardiology on 9/23.   Diabetes Sliding scale insulin  initially for coverage, added metformin  at discharge and continued on statin therapy - lipitor  80mg  qd.  On metformin  500mg  BID.  Home readings - 156-215, 221. Prior on BID dosing of metformin  1000mg  BID.  Gabapentin  2 per evening. Doing ok.  Lab Results  Component Value Date   CHOL 112 10/28/2023   HDL 34 (L) 10/28/2023   LDLCALC 66 10/28/2023   TRIG 60 10/28/2023   CHOLHDL 3.3 10/28/2023   Lab Results  Component Value Date   HGBA1C 7.1 (H) 10/27/2023   Hypokalemia, hypomagnesemia Potassium repleted with 40 mill equivalents KCl prior to discharge, mag 1.8, potassium 3.6 at discharge. Lab Results  Component Value Date   K 3.6 10/30/2023   Tobacco dependence Cessation counseling was given, nicotine  patch provided - wearing nicotine  patch, and nicotine  free vape at times. Cigarettes  have been disposed of.  Commended on efforts.   Reported stable hemoglobin, hematocrit with iron deficiency. Lab Results  Component Value Date   WBC 10.4 10/27/2023   HGB 14.1 10/27/2023   HCT 42.9 10/27/2023   MCV 98.4 10/27/2023   PLT 209 10/27/2023    Continued pantoprazole  BID for GERD.  Hypertension: Continued metoprolol  and  resumed lisinopril  for hypertension control. Home readings: 132/73 this am. 104/62 yesterday.  No new lightheadedness/dizziness.  BP Readings from Last 3 Encounters:  11/02/23 108/60  10/31/23 (!) 147/88  10/30/23 138/79   Lab Results  Component Value Date   CREATININE 0.55 (L) 10/30/2023   Wt Readings from Last 3 Encounters:  11/02/23 173 lb 6.4 oz (78.7 kg)  10/27/23 180 lb (81.6 kg)  08/23/23 170 lb (77.1 kg)    History Patient Active Problem List   Diagnosis Date Noted   Acute CVA (cerebrovascular accident) (HCC) 10/28/2023   Paroxysmal atrial fibrillation (HCC) 10/28/2023   Hypokalemia 10/28/2023   Stroke (cerebrum) (HCC) 10/27/2023   Stroke (HCC) 10/27/2023   GERD (gastroesophageal reflux disease) 04/27/2023   Iron deficiency 04/08/2023   Belching 02/08/2023   Hx of fusion of cervical spine 11/22/2022   AVM (arteriovenous malformation) of small bowel, acquired 06/30/2022   Iron deficiency anemia due to chronic blood loss 04/22/2022   History of esophagitis 04/22/2022   Heme positive stool 04/04/2022   Iron deficiency anemia 04/04/2022   Acute blood loss anemia 04/02/2022   Prostate cancer (HCC) 06/08/2021   History of colonic polyps 02/09/2021   Intestinal metaplasia of antrum of stomach without dysplasia 02/09/2021   Insomnia 03/12/2019   Pure hypercholesterolemia 11/29/2018   Hypogonadism, male 11/23/2018   Essential hypertension, benign 11/13/2018   High risk medication use 10/28/2018   Carotid stenosis, asymptomatic, bilateral 10/28/2018   Tobacco use 10/28/2018   Polyp of colon 10/28/2018   Lumbar disc herniation with radiculopathy 12/21/2017   Spinal stenosis of lumbar region without neurogenic claudication 12/21/2017   CAD -50% LAD  08/09/2015   Moderate aortic stenosis 08/09/2015   Acute pulmonary edema (HCC) 08/07/2015   Acute diastolic CHF (congestive heart failure) (HCC) 08/07/2015   Hypersomnia with sleep apnea 09/20/2013   Syncope 09/19/2013    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   OSA (obstructive sleep apnea) -non compliant 09/19/2013   Narcolepsy and cataplexy 09/19/2013   Facial abscess 04/28/2013   Facial cellulitis 04/28/2013   Mixed hyperlipidemia 04/28/2013   Tobacco dependence 04/28/2013   Sepsis (HCC) 04/28/2013   Gout 04/28/2013   Chronic diastolic CHF (congestive heart failure) (HCC) 04/28/2013   Pleuritic pain 02/24/2013   Chest pain 02/24/2013   Murmur 02/24/2013   Pericarditis 02/24/2013   Type 2 diabetes mellitus with hyperlipidemia (HCC) 02/24/2013   Essential hypertension 02/24/2013   Cervical spondylosis with myelopathy 01/19/2011   Past Medical History:  Diagnosis Date   Abnormal cardiovascular stress test 03/2013   Anxiety    Arthritis    Back pain    Cancer (HCC)    Prostate cancer-no treatment per pt   CHF (congestive heart failure) (HCC)    Coronary artery disease    Multivessel with significant LAD involvement by chest CT 2014   Depression    Diastolic dysfunction    Essential hypertension    Lumbar herniated disc    L4-L5   MRSA (methicillin resistant staph aureus) culture positive    Myocardial infarction (HCC)    Neuropathy    Shortness of breath dyspnea    Sleep apnea  No cpap   Type 2 diabetes mellitus [facility])    Past Surgical History:  Procedure Laterality Date   ANTERIOR CERVICAL DECOMP/DISCECTOMY FUSION  01/18/2011   Procedure: ANTERIOR CERVICAL DECOMPRESSION/DISCECTOMY FUSION 2 LEVELS;  Surgeon: Victory LABOR Pool;  Location: [facility];  Service: Neurosurgery;  Laterality: N/A;  anterior cervical discectomy and fusion with allograft and plating, cervical five-six, cervical six-seven   BIOPSY N/A 04/04/2015   Procedure: BIOPSY;  Surgeon: Claudis RAYMOND Rivet, MD;  Location: [facility];  Service: Endoscopy;  Laterality: N/A;   BIOPSY  11/09/2019   Procedure: BIOPSY;  Surgeon: Eartha Angelia Sieving, MD;  Location: [facility];  Service: Gastroenterology;;   Bone spur  removed  1997   Left shoulder   CARDIAC CATHETERIZATION N/A 08/08/2015   Procedure: Right/Left Heart Cath and Coronary Angiography;  Surgeon: Ozell Fell, MD;  Location: [facility] INVASIVE CV LAB;  Service: Cardiovascular;  Laterality: N/A;   CARPAL TUNNEL RELEASE Right    COLONOSCOPY WITH PROPOFOL  N/A 11/09/2019   Procedure: COLONOSCOPY WITH PROPOFOL ;  Surgeon: Eartha Angelia Sieving, MD;  Location: [facility];  Service: Gastroenterology;  Laterality: N/A;  900   COLONOSCOPY WITH PROPOFOL  N/A 02/27/2021   Procedure: COLONOSCOPY WITH PROPOFOL ;  Surgeon: Eartha Angelia Sieving, MD;  Location: [facility];  Service: Gastroenterology;  Laterality: N/A;  7:30   COLONOSCOPY WITH PROPOFOL  N/A 04/05/2022   Procedure: COLONOSCOPY WITH PROPOFOL ;  Surgeon: Shila Gustav GAILS, MD;  Location: [facility] ENDOSCOPY;  Service: Gastroenterology;  Laterality: N/A;   ENTEROSCOPY N/A 06/30/2022   Procedure: ENTEROSCOPY;  Surgeon: Eartha Angelia Sieving, MD;  Location: [facility];  Service: Gastroenterology;  Laterality: N/A;  2:00pm;ASA 3   ESOPHAGEAL DILATION N/A 04/04/2015   Procedure: ESOPHAGEAL DILATION;  Surgeon: Claudis RAYMOND Rivet, MD;  [facility]: [facility];  Service: Endoscopy;  Laterality: N/A;   ESOPHAGEAL DILATION N/A 11/09/2019   Procedure: ESOPHAGEAL DILATION;  Surgeon: Eartha Angelia Sieving, MD;  [facility]: [facility];  Service: Gastroenterology;  Laterality: N/A;   ESOPHAGOGASTRODUODENOSCOPY N/A 04/04/2015   Procedure: ESOPHAGOGASTRODUODENOSCOPY (EGD);  Surgeon: Claudis RAYMOND Rivet, MD;  [facility]: [facility];  Service: Endoscopy;  Laterality: N/A;  1240   ESOPHAGOGASTRODUODENOSCOPY (EGD) WITH PROPOFOL  N/A 11/09/2019   Procedure: ESOPHAGOGASTRODUODENOSCOPY (EGD) WITH PROPOFOL ;  Surgeon: Eartha Angelia Sieving, MD;  [facility];  Service: Gastroenterology;  Laterality: N/A;   ESOPHAGOGASTRODUODENOSCOPY (EGD) WITH PROPOFOL  N/A 04/04/2022   Procedure: ESOPHAGOGASTRODUODENOSCOPY (EGD)  WITH PROPOFOL ;  Surgeon: Leigh Elspeth SQUIBB, MD;  Location: [facility] ENDOSCOPY;  Service: Gastroenterology;  Laterality: N/A;   ESOPHAGOGASTRODUODENOSCOPY (EGD) WITH PROPOFOL   06/30/2022   Procedure: ESOPHAGOGASTRODUODENOSCOPY (EGD) WITH PROPOFOL ;  Surgeon: Eartha Angelia Sieving, MD;  [facility]: [facility];  Service: Gastroenterology;;   GIVENS CAPSULE STUDY N/A 05/11/2022   Procedure: GIVENS CAPSULE STUDY;  Surgeon: Eartha Angelia Sieving, MD;  [facility];  Service: Gastroenterology;  Laterality: N/A;  7:30 am   HEMORRHOID SURGERY     HOT HEMOSTASIS  06/30/2022   Procedure: HOT HEMOSTASIS (ARGON PLASMA COAGULATION/BICAP);  Surgeon: Eartha Angelia, Sieving, MD;  [facility]: [facility];  Service: Gastroenterology;;   POLYPECTOMY  11/09/2019   Procedure: POLYPECTOMY;  Surgeon: Eartha Angelia Sieving, MD;  [facility];  Service: Gastroenterology;;   POLYPECTOMY  02/27/2021   Procedure: POLYPECTOMY;  Surgeon: Eartha Angelia Sieving, MD;  [facility];  Service: Gastroenterology;;   POLYPECTOMY  04/05/2022   Procedure: POLYPECTOMY;  Surgeon: Shila Gustav GAILS, MD;  Location: [facility] ENDOSCOPY;  Service:  Gastroenterology;;   TONSILLECTOMY     Allergies  Allergen Reactions   Morphine And Codeine Nausea And Vomiting   Prior to Admission medications   Medication Sig Start Date End Date Taking? Authorizing Provider  ACCU-CHEK AVIVA PLUS test strip USE AS INSTRUCTED 06/13/19  Yes Corum, Olam CROME, MD  apixaban  (ELIQUIS ) 5 MG TABS tablet Take 1 tablet (5 mg total) by mouth 2 (two) times daily. 10/30/23  Yes Arrien, Elidia Sieving, MD  atorvastatin  (LIPITOR ) 80 MG tablet Take 1 tablet (80 mg total) by mouth daily. 02/17/23  Yes BranchDorn FALCON, MD  Calcium -Magnesium -Zinc (CAL-MAG-ZINC PO) Take 1 tablet by mouth daily.   Yes [provider]  furosemide  (LASIX ) 40 MG tablet Take 1 tablet (40 mg total) by mouth as needed for edema (swelling). Patient taking  differently: Take 40 mg by mouth as needed for edema (swelling). 10/31/23: Reports during TOC call, has been taking every day 05/11/23  Yes Miriam Norris, NP  gabapentin  (NEURONTIN ) 300 MG capsule TAKE 1 TO 2 CAPSULES BY MOUTH  TWICE DAILY 06/03/23  Yes Levora Reyes SAUNDERS, MD  lisinopril  (ZESTRIL ) 20 MG tablet Take 1 tablet (20 mg total) by mouth daily. 10/30/23 01/28/24 Yes Arrien, Elidia Sieving, MD  metFORMIN  (GLUCOPHAGE ) 500 MG tablet Take 1 tablet (500 mg total) by mouth daily with breakfast. 10/31/23  Yes Arrien, Elidia Sieving, MD  metoprolol  succinate (TOPROL -XL) 50 MG 24 hr tablet TAKE 1 TABLET BY MOUTH DAILY Patient taking differently: Take 50 mg by mouth every evening. 04/21/23  Yes BranchDorn FALCON, MD  Multiple Vitamin (MULTIVITAMIN WITH MINERALS) TABS Take 1 tablet by mouth daily.   Yes [provider]  nicotine  (NICODERM CQ  - DOSED IN MG/24 HOURS) 21 mg/24hr patch Place 1 patch (21 mg total) onto the skin daily. 10/30/23  Yes Arrien, Mauricio Daniel, MD  nitroGLYCERIN  (NITROSTAT ) 0.4 MG SL tablet Place 1 tablet (0.4 mg total) under the tongue every 5 (five) minutes x 3 doses as needed for chest pain (if no relief after 2nd dose, proceed to ED for an evaluation). 09/09/23  Yes Branch, Dorn FALCON, MD  Omega-3 Fatty Acids (OMEGA 3 FISH OIL PO) Take 1 capsule by mouth daily.   Yes [provider]  pantoprazole  (PROTONIX ) 40 MG tablet Take 1 tablet (40 mg total) by mouth 2 (two) times daily. 04/27/23  Yes Carlan, Mitzie CROME, NP   Social History   Socioeconomic History   Marital status: Married    Spouse name: Not on file   Number of children: Not on file   Years of education: Not on file   Highest education level: Not on file  Occupational History   Not on file  Tobacco Use   Smoking status: Every Day    Current packs/day: 1.00    Average packs/day: 1 pack/day for 55.9 years (55.9 ttl pk-yrs)    Types: Cigarettes    Start date: 11/26/1967   Smokeless tobacco:  Former   Tobacco comments:    smoking x 50 yrs  Vaping Use   Vaping status: Never Used  Substance and Sexual Activity   Alcohol use: Not Currently    Comment: rare   Drug use: Yes    Frequency: 5.0 times per week    Types: Marijuana    Comment: as needed   Sexual activity: Yes  Other Topics Concern   Not on file  Social History Narrative   Lives with wife   Right Handed   Drinks 2-3 cups caffeine daily  Social Drivers of Corporate investment banker Strain: Low Risk  (11/17/2022)   Overall Financial Resource Strain (CARDIA)    Difficulty of Paying Living Expenses: Not hard at all  Food Insecurity: No Food Insecurity (10/31/2023)   Hunger Vital Sign    Worried About Running Out of Food in the Last Year: Never true    Ran Out of Food in the Last Year: Never true  Transportation Needs: No Transportation Needs (10/31/2023)   PRAPARE - Administrator, Civil Service (Medical): No    Lack of Transportation (Non-Medical): No  Physical Activity: Inactive (11/17/2022)   Exercise Vital Sign    Days of Exercise per Week: 0 days    Minutes of Exercise per Session: 0 min  Stress: No Stress Concern Present (11/17/2022)   [employer] of Occupational Health - Occupational Stress Questionnaire    Feeling of Stress : Not at all  Social Connections: Unknown (10/27/2023)   Social Connection and Isolation Panel    Frequency of Communication with Friends and Family: More than three times a week    Frequency of Social Gatherings with Friends and Family: More than three times a week    Attends Religious Services: Patient declined    Database administrator or Organizations: Patient declined    Attends Banker Meetings: Patient declined    Marital Status: Married  Catering manager Violence: Not At Risk (10/31/2023)   Humiliation, Afraid, Rape, and Kick questionnaire    Fear of Current or Ex-Partner: No    Emotionally Abused: No    Physically Abused: No    Sexually  Abused: No    Review of Systems Per HPI Objective:   Vitals:   11/02/23 1057  BP: 108/60  Pulse: 79  Resp: 18  Temp: 98.1 F (36.7 C)  TempSrc: Temporal  SpO2: 97%  Weight: 173 lb 6.4 oz (78.7 kg)  Height: 5' 7 (1.702 m)     Physical Exam Vitals reviewed.  Constitutional:      Appearance: He is well-developed.  HENT:     Head: Normocephalic and atraumatic.  Neck:     Vascular: No carotid bruit or JVD.  Cardiovascular:     Rate and Rhythm: Normal rate and regular rhythm.     Heart sounds: Murmur (2/6 SEM) heard.  Pulmonary:     Effort: Pulmonary effort is normal.     Breath sounds: Normal breath sounds. No rales.  Musculoskeletal:     Right lower leg: No edema.     Left lower leg: No edema.  Skin:    General: Skin is warm and dry.  Neurological:     General: No focal deficit present.     Mental Status: He is alert and oriented to person, place, and time.     Comments: Ambulating with cane, speaking in full sentences, no apparent slurred speech or focal deficits appreciated on exam.  No pronator drift.  Normal RAM, heel-to-shin, finger-nose, equal facial movements.  Equal strength upper extremity and lower extremity bilaterally.  Psychiatric:        Mood and Affect: Mood normal.        Assessment & Plan:  Jonathan Gilmore is a 73 y.o. male . Acute CVA (cerebrovascular accident) [facility]) - Plan: Ambulatory referral to Physical Therapy, Ambulatory referral to Neurology  - Tolerating coagulation as above, prior PT/OT eval, outpatient PT planned at this time.  Fall precautions, assistive devices discussed.  Follow-up with neuro as planned.  Continue  to strive for hypertension, diabetes, lipid control.  RTC/ER precautions if new symptoms.  Commended on smoking cessation.  Advised to not drive, operate heavy machinery until cleared to do so by neurology.  Essential hypertension - Plan: lisinopril  (ZESTRIL ) 20 MG tablet  - Stable with current med regimen, continue  same.  DM type 2 causing vascular disease (HCC) - Plan: metFORMIN  (GLUCOPHAGE ) 500 MG tablet  - Restart prior dosing of metformin .  Iron deficiency anemia due to chronic blood loss - Plan: CBC  - Check updated CBC.  Paroxysmal atrial fibrillation (HCC) - Plan: apixaban  (ELIQUIS ) 5 MG TABS tablet  - Tolerate anticoagulation as above.  Rate controlled.  Keep follow-up with cardiology including valve surgery timing.  History of hypokalemia - Plan: Basic metabolic panel with GFR, Magnesium    Meds ordered this encounter  Medications   metFORMIN  (GLUCOPHAGE ) 500 MG tablet    Sig: Take 2 tablets (1,000 mg total) by mouth 2 (two) times daily with a meal.    Dispense:  360 tablet    Refill:  1   lisinopril  (ZESTRIL ) 20 MG tablet    Sig: Take 1 tablet (20 mg total) by mouth daily.    Dispense:  90 tablet    Refill:  1   apixaban  (ELIQUIS ) 5 MG TABS tablet    Sig: Take 1 tablet (5 mg total) by mouth 2 (two) times daily.    Dispense:  60 tablet    Refill:  2   Patient Instructions  Return to twice daily dosing of metformin  for improved blood sugar control.  No change in blood pressure or other meds today.  I referred you to neurology and physical therapy - let me know if you have not heard from them in next 2 weeks.   I do not recommend driving or operating tractor/mower until cleared to do so by physical therapy and neurology.   Keep up with the good work on avoiding cigarettes.  If any concerns on labs today I will let you know.  Hang in there!     Signed,   Reyes Pines, MD [facility] 11/02/23 12:22 PM

## 2023-11-02 NOTE — Telephone Encounter (Signed)
 I am looking into this and reaching out to Laine Tousey, RN that completed his TOC call. According to that note, patient is establishing care with [facility] next month. I have asked Laine if there is anything that needs to be done by us  for his PT or if that's being handled within [facility]

## 2023-11-02 NOTE — Telephone Encounter (Signed)
 Referral to PT ordered at his transitional care visit today.  Thank you

## 2023-11-04 ENCOUNTER — Encounter: Payer: Self-pay | Admitting: Family Medicine

## 2023-11-04 NOTE — Telephone Encounter (Signed)
 Is there another PT location that can take patient earlier or is normal to be scheduled out so far? Sending to [facility] to look into and attaching Dr. Levora as an FYI

## 2023-11-06 ENCOUNTER — Encounter: Payer: Self-pay | Admitting: Family Medicine

## 2023-11-07 ENCOUNTER — Encounter (HOSPITAL_COMMUNITY): Admitting: Occupational Therapy

## 2023-11-08 ENCOUNTER — Ambulatory Visit: Payer: Self-pay | Admitting: Family Medicine

## 2023-11-09 ENCOUNTER — Encounter (HOSPITAL_COMMUNITY): Admitting: Occupational Therapy

## 2023-11-09 ENCOUNTER — Other Ambulatory Visit: Payer: Self-pay | Admitting: *Deleted

## 2023-11-09 NOTE — Transitions of Care (Post Inpatient/ED Visit) (Signed)
 Transition of Care week 2/ day # 9  Visit Note  11/09/2023  Name: Jonathan Gilmore MRN: [id number]          DOB: 10-28-1950  Situation: Patient enrolled in [facility] 30-day program. Visit completed with patient by telephone.   HIPAA identifiers x 2 verified  Background:  Hospital admission August 14-17, 2025 for acute CVA without deficit Independent at baseline; supportive assisting as indicated post- recent hospital discharge (1) unplanned hospital admission x last (6) and (12) months  Initial Transition Care Management Follow-up Telephone Call    Past Medical History:  Diagnosis Date   Abnormal cardiovascular stress test 03/2013   Anxiety    Arthritis    Back pain    Cancer (HCC)    Prostate cancer-no treatment per pt   CHF (congestive heart failure) (HCC)    Coronary artery disease    Multivessel with significant LAD involvement by chest CT 2014   Depression    Diastolic dysfunction    Essential hypertension    Lumbar herniated disc    L4-L5   MRSA (methicillin resistant staph aureus) culture positive    Myocardial infarction (HCC)    Neuropathy    Shortness of breath dyspnea    Sleep apnea    No cpap   Type 2 diabetes mellitus (HCC)    Assessment:  I am much better- got a good report from the doctor and not even having to use my cane much any more.  Hoping to start driving slowly at some point.  Increased the metformin  like they told me to.  My blood sugars, weights and blood pressures all looking good and I am feeling good  Denies clinical concerns and sounds to be in no distress throughout TOC 30-day program outreach call today  Patient Reported Symptoms: Cognitive Cognitive Status: Normal speech and language skills, Alert and oriented to person, place, and time, Insightful and able to interpret abstract concepts      Neurological Neurological Review of Symptoms: No symptoms reported [place] Neurological Symptoms/Conditions [RPT]: Recent diagnosis of CVA:  Reinforced  signs/ symptoms CVA along with corresponding action plan- continues to verbalize good understanding of same- patient denies symptoms today; confirms has not yet heard from neurology provider Neurological Management Strategies: Routine screening, Diet modification, Medication therapy, Coping strategies  HEENT HEENT Symptoms Reported: No symptoms reported      Cardiovascular Cardiovascular Symptoms Reported: No symptoms reported Other Cardiovascular Symptoms: Confirmed continues to monitor and record blood pressures and daily weights at home; Reinforced previously provided educations re: rationale for daily blood pressure/ weight monitoring at home along with weight gain guidelines/ action plan for weight gain; importance of taking diuretic as prescribed; confirmed has continnues taking Lasix  QD post-hospital discharge; reports consistent weights at home as between 170-172 lbs; and blood pressures between 130-140/ 60-70 Does patient have uncontrolled Hypertension?: No Cardiovascular Management Strategies: Routine screening, Medication therapy, Coping strategies, Adequate rest, Weight management Do You Have a Working Readable Scale?: Yes Weight: 170 lb 12.8 oz (77.5 kg) (home reported value 11/09/23: reports yesterday's weight)  Respiratory Respiratory Symptoms Reported: No symptoms reported Other Respiratory Symptoms: Denies shortness of breath; sounds to be in no respiratory distress throughout TOC call Respiratory Management Strategies: Adequate rest, Coping strategies, Weight management, Routine screening  Endocrine Endocrine Symptoms Reported: No symptoms reported Is patient diabetic?: Yes Is patient checking blood sugars at home?: Yes List most recent blood sugar readings, include date and time of day: Confirmed continues to monitor/ record blood sugars at  home 2-4 times per day; Reports consistent ranges between 127-147 (fasting; reports ranges sometimes go up to 200 after eating;  reviewed PCP office visit 11/02/23- confirmed patient has increased dosing of metformin  as prescribed; Reinforced blood sugar management at home; need to follow carbohydrate modified diet; A1-C goals    Gastrointestinal Gastrointestinal Symptoms Reported: No symptoms reported      Genitourinary Genitourinary Symptoms Reported: Frequency Additional Genitourinary Details: Continues to report peeing just fine: peeing a lot- pale yellow clear urine, especially after taking that fluid pill Genitourinary Management Strategies: Medication therapy, Coping strategies  Integumentary Integumentary Symptoms Reported: No symptoms reported    Musculoskeletal Musculoskelatal Symptoms Reviewed: No symptoms reported Additional Musculoskeletal Details: confirmed not currently requiring/ using assistive devices for ambulation - states he has not needed, his balance has improved in one week- encouraged him to keep assistive devices nearby for now for prn use Musculoskeletal Management Strategies: Routine screening, Coping strategies, Medical device      Psychosocial Psychosocial Symptoms Reported: Alteration in sleep habits, Sadness - if selected complete PHQ 2-9 Behavioral Management Strategies: Support system, Adequate rest, Coping strategies Major Change/Loss/Stressor/Fears (CP): Medical condition, self Techniques to Cope with Loss/Stress/Change: Diversional activities Quality of Family Relationships: helpful, involved, supportive   Vitals:   11/09/23 1150  BP: 137/66    Medications Reviewed Today     Reviewed by Lilianna Case M, RN (Registered Nurse) on 11/09/23 at 1137  Med List Status: <None>   Medication Order Taking? Sig Documenting Provider Last Dose Status Informant  ACCU-CHEK AVIVA PLUS test strip [id number]  USE AS INSTRUCTED Corum, Olam CROME, MD  Active Spouse/Significant Other, Pharmacy Records  apixaban  (ELIQUIS ) 5 MG TABS tablet [id number]  Take 1 tablet (5 mg total) by mouth 2 (two) times  daily. Levora Reyes SAUNDERS, MD  Active   atorvastatin  (LIPITOR ) 80 MG tablet [id number]  Take 1 tablet (80 mg total) by mouth daily. Alvan Dorn FALCON, MD  Active Spouse/Significant Other, Pharmacy Records  Calcium -Magnesium -Zinc (CAL-MAG-ZINC PO) [id number]  Take 1 tablet by mouth daily. [provider]  Active Spouse/Significant Other, Pharmacy Records  furosemide  (LASIX ) 40 MG tablet [id number]  Take 1 tablet (40 mg total) by mouth as needed for edema (swelling).  Patient taking differently: Take 40 mg by mouth as needed for edema (swelling). 10/31/23: Reports during TOC call, has been taking every day   Miriam Norris, NP  Active Spouse/Significant Other, Pharmacy Records  gabapentin  (NEURONTIN ) 300 MG capsule [id number]  TAKE 1 TO 2 CAPSULES BY MOUTH  TWICE DAILY Levora Reyes SAUNDERS, MD  Active Spouse/Significant Other, Pharmacy Records  lisinopril  (ZESTRIL ) 20 MG tablet [id number]  Take 1 tablet (20 mg total) by mouth daily. Levora Reyes SAUNDERS, MD  Active   metFORMIN  (GLUCOPHAGE ) 500 MG tablet [id number] Yes Take 2 tablets (1,000 mg total) by mouth 2 (two) times daily with a meal. Levora Reyes SAUNDERS, MD  Active   metoprolol  succinate (TOPROL -XL) 50 MG 24 hr tablet [id number]  TAKE 1 TABLET BY MOUTH DAILY  Patient taking differently: Take 50 mg by mouth every evening.   Alvan Dorn FALCON, MD  Active Spouse/Significant Other, Pharmacy Records  Multiple Vitamin (MULTIVITAMIN WITH MINERALS) TABS [id number]  Take 1 tablet by mouth daily. [provider]  Active Spouse/Significant Other, Pharmacy Records  nicotine  (NICODERM CQ  - DOSED IN MG/24 HOURS) 21 mg/24hr patch [id number]  Place 1 patch (21 mg total) onto the skin daily. Arrien, Elidia Sieving, MD  Active   nitroGLYCERIN  (NITROSTAT ) 0.4 MG SL tablet [id number]  Place 1 tablet (0.4 mg total) under the tongue every 5 (five) minutes x 3 doses as needed for chest pain (if no relief after 2nd dose, proceed to ED for an evaluation). Alvan Dorn FALCON, MD  Active Spouse/Significant Other, Pharmacy Records  Omega-3 Fatty Acids (OMEGA 3 FISH OIL PO) [id number]  Take 1 capsule by mouth daily. [provider]  Active   pantoprazole  (PROTONIX ) 40 MG tablet [id number]  Take 1 tablet (40 mg total) by mouth 2 (two) times daily. Carlan, Chelsea L, NP  Active Spouse/Significant Other, Pharmacy Records           Recommendation:   Continue Current Plan of Care  Follow Up Plan:   Telephone follow-up in 1 week- as scheduled 11/16/23  Pls call/ message for questions,  Allicia Culley Mckinney Zayveon Raschke, RN, BSN, CCRN Alumnus RN Care Manager  Transitions of Care  [facility] - [facility] [phone]: direct office

## 2023-11-09 NOTE — Patient Instructions (Signed)
 Visit Information  Thank you for taking time to visit with me today. Please don't hesitate to contact me if I can be of assistance to you before our next scheduled telephone appointment.  Our next appointment is by telephone on Wednesday November 16, 2023 at 11:15 am  Please call the care guide team at [phone] if you need to cancel or reschedule your appointment.   Following are the goals we discussed today:  Patient Self Care Activities:  Attend all scheduled provider appointments Call provider office for new concerns or questions  Participate in Transition of Care Program/Attend TOC scheduled calls Take medications as prescribed   Continue pacing activity as your recuperation from recent surgery continues Weigh yourself every day to stay on top of early fluid retention: write down your weights every day so you remember what it is from day to day: follow the weight-gain guidelines and action plan to call your doctor if you gain more than 3 lbs overnight, or 5 lbs in one week Continue monitoring and recording your blood pressures, daily weights, and blood sugars at home Use assistive devices as needed to prevent falls- your walking stick and/ or your new walker Please go over all of your medications with your care providers /doctors Eat a heart healthy and low salt diet If you believe your condition is getting worse- contact your care providers (doctors) promptly- reaching out to your doctor early when you have concerns can prevent you from having to go to the hospital  If you are experiencing a Mental Health or Behavioral Health Crisis or need someone to talk to, please  call the Suicide and Crisis Lifeline: 988 call the [place]  National Suicide Prevention Lifeline: 860-092-7339 or TTY: 346-559-5372 TTY (239) 228-5761) to talk to a trained counselor call 1-800-273-TALK (toll free, 24 hour hotline) go to [facility] [address], [place]  [phone]) call the [facility] Crisis Line: [phone] call 911   Patient verbalizes understanding of instructions and care plan provided today and agrees to view in MyChart. Active MyChart status and patient understanding of how to access instructions and care plan via MyChart confirmed with patient.     Pls call/ message for questions,  Bunny Kleist Mckinney Kateryn Marasigan, RN, BSN, CCRN Alumnus RN Care Manager  Transitions of Care  [facility] - [facility] [phone]: direct office

## 2023-11-15 ENCOUNTER — Ambulatory Visit (HOSPITAL_COMMUNITY): Attending: Orthopedic Surgery

## 2023-11-15 ENCOUNTER — Other Ambulatory Visit: Payer: Self-pay

## 2023-11-15 ENCOUNTER — Encounter (HOSPITAL_COMMUNITY): Payer: Self-pay

## 2023-11-15 ENCOUNTER — Encounter (HOSPITAL_COMMUNITY): Admitting: Occupational Therapy

## 2023-11-15 DIAGNOSIS — Z7409 Other reduced mobility: Secondary | ICD-10-CM | POA: Diagnosis present

## 2023-11-15 DIAGNOSIS — R262 Difficulty in walking, not elsewhere classified: Secondary | ICD-10-CM | POA: Diagnosis present

## 2023-11-15 DIAGNOSIS — Z9181 History of falling: Secondary | ICD-10-CM | POA: Insufficient documentation

## 2023-11-15 DIAGNOSIS — M25612 Stiffness of left shoulder, not elsewhere classified: Secondary | ICD-10-CM | POA: Insufficient documentation

## 2023-11-15 NOTE — Therapy (Signed)
 OUTPATIENT PHYSICAL THERAPY NEURO EVALUATION   Patient Name: Jonathan Gilmore MRN: [id number] DOB:1950/07/29, 73 y.o., male Today's Date: 11/15/2023     END OF SESSION:  PT End of Session - 11/15/23 0841     Visit Number 1    Date for PT Re-Evaluation 12/27/23    Authorization Type [employer] MEDICARE    Authorization Time Period seeking auth    Authorization - Visit Number 0    Progress Note Due on Visit 10    PT Start Time 0800    PT Stop Time 0840    PT Time Calculation (min) 40 min    Activity Tolerance Patient tolerated treatment well    Behavior During Therapy [facility] for tasks assessed/performed          Past Medical History:  Diagnosis Date   Abnormal cardiovascular stress test 03/2013   Anxiety    Arthritis    Back pain    Cancer (HCC)    Prostate cancer-no treatment per pt   CHF (congestive heart failure) (HCC)    Coronary artery disease    Multivessel with significant LAD involvement by chest CT 2014   Depression    Diastolic dysfunction    Essential hypertension    Lumbar herniated disc    L4-L5   MRSA (methicillin resistant staph aureus) culture positive    Myocardial infarction (HCC)    Neuropathy    Shortness of breath dyspnea    Sleep apnea    No cpap   Type 2 diabetes mellitus (HCC)    Past Surgical History:  Procedure Laterality Date   ANTERIOR CERVICAL DECOMP/DISCECTOMY FUSION  01/18/2011   Procedure: ANTERIOR CERVICAL DECOMPRESSION/DISCECTOMY FUSION 2 LEVELS;  Surgeon: Victory LABOR Pool;  Location: [facility];  Service: Neurosurgery;  Laterality: N/A;  anterior cervical discectomy and fusion with allograft and plating, cervical five-six, cervical six-seven   BIOPSY N/A 04/04/2015   Procedure: BIOPSY;  Surgeon: Claudis RAYMOND Rivet, MD;  Location: [facility];  Service: Endoscopy;  Laterality: N/A;   BIOPSY  11/09/2019   Procedure: BIOPSY;  Surgeon: Eartha Angelia Sieving, MD;  Location: [facility];  Service: Gastroenterology;;   Bone spur removed  1997    Left shoulder   CARDIAC CATHETERIZATION N/A 08/08/2015   Procedure: Right/Left Heart Cath and Coronary Angiography;  Surgeon: Ozell Fell, MD;  Location: [facility] INVASIVE CV LAB;  Service: Cardiovascular;  Laterality: N/A;   CARPAL TUNNEL RELEASE Right    COLONOSCOPY WITH PROPOFOL  N/A 11/09/2019   Procedure: COLONOSCOPY WITH PROPOFOL ;  Surgeon: Eartha Angelia Sieving, MD;  Location: [facility];  Service: Gastroenterology;  Laterality: N/A;  900   COLONOSCOPY WITH PROPOFOL  N/A 02/27/2021   Procedure: COLONOSCOPY WITH PROPOFOL ;  Surgeon: Eartha Angelia Sieving, MD;  Location: [facility];  Service: Gastroenterology;  Laterality: N/A;  7:30   COLONOSCOPY WITH PROPOFOL  N/A 04/05/2022   Procedure: COLONOSCOPY WITH PROPOFOL ;  Surgeon: Shila Gustav GAILS, MD;  Location: [facility];  Service: Gastroenterology;  Laterality: N/A;   ENTEROSCOPY N/A 06/30/2022   Procedure: ENTEROSCOPY;  Surgeon: Eartha Angelia Sieving, MD;  [facility]: [facility];  Service: Gastroenterology;  Laterality: N/A;  2:00pm;ASA 3   ESOPHAGEAL DILATION N/A 04/04/2015   Procedure: ESOPHAGEAL DILATION;  Surgeon: Claudis RAYMOND Rivet, MD;  [facility]: [facility];  Service: Endoscopy;  Laterality: N/A;   ESOPHAGEAL DILATION N/A 11/09/2019   Procedure: ESOPHAGEAL DILATION;  Surgeon: Eartha Angelia Sieving, MD;  [facility];  Service: Gastroenterology;  Laterality: N/A;  ESOPHAGOGASTRODUODENOSCOPY N/A 04/04/2015   Procedure: ESOPHAGOGASTRODUODENOSCOPY (EGD);  Surgeon: Claudis RAYMOND Rivet, MD;  [facility];  Service: Endoscopy;  Laterality: N/A;  1240   ESOPHAGOGASTRODUODENOSCOPY (EGD) WITH PROPOFOL  N/A 11/09/2019   Procedure: ESOPHAGOGASTRODUODENOSCOPY (EGD) WITH PROPOFOL ;  Surgeon: Eartha Angelia Sieving, MD;  [facility];  Service: Gastroenterology;  Laterality: N/A;   ESOPHAGOGASTRODUODENOSCOPY (EGD) WITH PROPOFOL  N/A 04/04/2022   Procedure: ESOPHAGOGASTRODUODENOSCOPY (EGD) WITH PROPOFOL ;   Surgeon: Leigh Elspeth SQUIBB, MD;  Location[facility] ENDOSCOPY;  Service: Gastroenterology;  Laterality: N/A;   ESOPHAGOGASTRODUODENOSCOPY (EGD) WITH PROPOFOL   06/30/2022   Procedure: ESOPHAGOGASTRODUODENOSCOPY (EGD) WITH PROPOFOL ;  Surgeon: Eartha Angelia Sieving, MD;  [facility];  Service: Gastroenterology;;   GIVENS CAPSULE STUDY N/A 05/11/2022   Procedure: GIVENS CAPSULE STUDY;  Surgeon: Eartha Angelia Sieving, MD;  [facility];  Service: Gastroenterology;  Laterality: N/A;  7:30 am   HEMORRHOID SURGERY     HOT HEMOSTASIS  06/30/2022   Procedure: HOT HEMOSTASIS (ARGON PLASMA COAGULATION/BICAP);  Surgeon: Eartha Angelia, Sieving, MD;  [facility]: [facility];  Service: Gastroenterology;;   POLYPECTOMY  11/09/2019   Procedure: POLYPECTOMY;  Surgeon: Eartha Angelia Sieving, MD;  [facility];  Service: Gastroenterology;;   POLYPECTOMY  02/27/2021   Procedure: POLYPECTOMY;  Surgeon: Eartha Angelia Sieving, MD;  [facility];  Service: Gastroenterology;;   POLYPECTOMY  04/05/2022   Procedure: POLYPECTOMY;  Surgeon: Shila Gustav GAILS, MD;  Location: [facility] ENDOSCOPY;  Service: Gastroenterology;;   TONSILLECTOMY     Patient Active Problem List   Diagnosis Date Noted   Acute CVA (cerebrovascular accident) (HCC) 10/28/2023   Paroxysmal atrial fibrillation (HCC) 10/28/2023   Hypokalemia 10/28/2023   Stroke (cerebrum) (HCC) 10/27/2023   Stroke (HCC) 10/27/2023   GERD (gastroesophageal reflux disease) 04/27/2023   Iron deficiency 04/08/2023   Belching 02/08/2023   Hx of fusion of cervical spine 11/22/2022   AVM (arteriovenous malformation) of small bowel, acquired 06/30/2022   Iron deficiency anemia due to chronic blood loss 04/22/2022   History of esophagitis 04/22/2022   Heme positive stool 04/04/2022   Iron deficiency anemia 04/04/2022   Acute blood loss anemia 04/02/2022   Prostate cancer (HCC) 06/08/2021   History of colonic polyps  02/09/2021   Intestinal metaplasia of antrum of stomach without dysplasia 02/09/2021   Insomnia 03/12/2019   Pure hypercholesterolemia 11/29/2018   Hypogonadism, male 11/23/2018   Essential hypertension, benign 11/13/2018   High risk medication use 10/28/2018   Carotid stenosis, asymptomatic, bilateral 10/28/2018   Tobacco use 10/28/2018   Polyp of colon 10/28/2018   Lumbar disc herniation with radiculopathy 12/21/2017   Spinal stenosis of lumbar region without neurogenic claudication 12/21/2017   CAD -50% LAD  08/09/2015   Moderate aortic stenosis 08/09/2015   Acute pulmonary edema (HCC) 08/07/2015   Acute diastolic CHF (congestive heart failure) (HCC) 08/07/2015   Hypersomnia with sleep apnea 09/20/2013   Syncope 09/19/2013   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   OSA (obstructive sleep apnea) -non compliant 09/19/2013   Narcolepsy and cataplexy 09/19/2013   Facial abscess 04/28/2013   Facial cellulitis 04/28/2013   Mixed hyperlipidemia 04/28/2013   Tobacco dependence 04/28/2013   Sepsis (HCC) 04/28/2013   Gout 04/28/2013   Chronic diastolic CHF (congestive heart failure) (HCC) 04/28/2013   Pleuritic pain 02/24/2013   Chest pain 02/24/2013   Murmur 02/24/2013   Pericarditis 02/24/2013   Type 2 diabetes mellitus with hyperlipidemia (HCC) 02/24/2013   Essential hypertension 02/24/2013   Cervical spondylosis with  myelopathy 01/19/2011   PCP: Levora Reyes SAUNDERS, MD  REFERRING PROVIDER: Noralee Elidia Sieving, MD ONSET DATE: 11/03/23  REFERRING DIAG: I63.9 (ICD-10-CM) - Acute CVA (cerebrovascular accident) (HCC)  THERAPY DIAG:  Impaired functional mobility, balance, and endurance  Difficulty in walking, not elsewhere classified  History of falling  Rationale for Evaluation and Treatment: Rehabilitation  SUBJECTIVE:                                                                                                                                                                                              SUBJECTIVE STATEMENT: Pt states he lost balance when stroke occurred, was not using cane before the stroke happened. Pt states he was outiside weedeating when the stroke occurred and fell several times, was crawling around due to imbalance/dizziness. Pt denies any weakness or numbness related to the stroke, does have neuropahty due to diabetes. Pts wife states pt was leaning to the right side and right eye was drooping. Pt accompanied by: self and significant other  PERTINENT HISTORY:  Diabetes, neuropathy CHF Aortic Valve issue  PAIN:  Are you having pain? Yes: NPRS scale: 7/10 Pain location: bilateral shoulder Pain description: aches, sharp pain with movement Aggravating factors: sleeping, moving Relieving factors: laying on back  PRECAUTIONS: Fall  RED FLAGS: None   WEIGHT BEARING RESTRICTIONS: No  FALLS: Has patient fallen in last 6 months? Yes. Number of falls 6-8  LIVING ENVIRONMENT: Lives with: lives with their spouse Lives in: House/apartment Stairs: Yes: External: 2 steps; on right going up Has following equipment at home: Single point cane and Walker - 2 wheeled  PLOF: Independent and Independent with basic ADLs  PATIENT GOALS: improve balance and walking with LRAD  OBJECTIVE:  Note: Objective measures were completed at Evaluation unless otherwise noted.  DIAGNOSTIC FINDINGS:  EXAM: MRI BRAIN WITHOUT CONTRAST 10/27/2023 04:34:27 PM   TECHNIQUE: Multiplanar multisequence MRI of the head/brain was performed without the administration of intravenous contrast.   COMPARISON: MRI of the brain dated 09/10/2000.   CLINICAL HISTORY: Syncope/presyncope, cerebrovascular cause suspected. Syncope; Limited positioning, pt combative.   FINDINGS:   BRAIN AND VENTRICLES: There is a new area of restricted diffusion present medially within the right cerebellar hemisphere. There is mild cerebellar swelling with partial effacement  of the fourth ventricle. There is mild periventricular and deep cerebral white matter disease present.   ORBITS: No acute abnormality.   SINUSES AND MASTOIDS: Polypoidal mucosal disease present within the floors of the maxillary sinuses.   BONES AND SOFT TISSUES: Normal marrow signal. No acute soft tissue abnormality.   IMPRESSION: 1. New area of restricted diffusion  medially within the right cerebellar hemisphere with mild cerebellar swelling and partial effacement of the fourth ventricle, consistent with Nonhemorrhagic acute infarct. 2. Mild periventricular and deep cerebral white matter disease.   Electronically signed by: evalene coho 10/27/2023 05:20 PM EDT RP Workstation: [id number]  COGNITION: Overall cognitive status: Pts significant other reports changes in cognition although pt denies any change   SENSATION: Light touch: Impaired  Toes  COORDINATION: Pt demonstrates abnormal finger to nose testing with LUE  DTRs:  Not assessed this date   LOWER EXTREMITY ROM:     Active  Right Eval Left Eval  Hip flexion    Hip extension    Hip abduction    Hip adduction    Hip internal rotation    Hip external rotation    Knee flexion    Knee extension    Ankle dorsiflexion    Ankle plantarflexion    Ankle inversion    Ankle eversion     (Blank rows = not tested)  LOWER EXTREMITY MMT:    MMT Right Eval Left Eval  Hip flexion 4 4  Hip extension    Hip abduction 4 4  Hip adduction 4 4  Hip internal rotation    Hip external rotation    Knee flexion 4 4  Knee extension 4 4  Ankle dorsiflexion 3+ 3+  Ankle plantarflexion    Ankle inversion    Ankle eversion    (Blank rows = not tested)    STAIRS: Findings: Stair Negotiation Technique: Alternating Pattern  with Single Rail on Left, Number of Stairs: 4, Height of Stairs: 8 inches   , and Comments: pt demonstrates good LE strength with alternating pattern but increased time required GAIT: Findings:  Gait Characteristics: decreased stride length, wide BOS, and poor foot clearance- Right, Distance walked: 270 feet, Assistive device utilized:cane held in hand but did not use it and None, and Comments: pt demonstrates consistent early heel strike on RLE and occassional early heel strike on LLE. Pt states it is because of the shoewear, will reassess next visit with pt instructed to wear sneakers next session.   FUNCTIONAL TESTS:  5 times sit to stand: 11.53 seconds, minimal UE support 2 minute walk test: 270 ft SLS 11/15/23: R: 1.5 seconds L: 0.96 seconds  PATIENT SURVEYS:  ABC scale: TBA                                                                                                                               TREATMENT DATE:  11/15/2023  Evaluation: -ROM measured, Strength assessed, HEP prescribed, pt educated on prognosis, findings, and importance of HEP compliance if given.   PATIENT EDUCATION: Education details: Pt was educated on findings of PT evaluation, prognosis, frequency of therapy visits and rationale, attendance policy, and HEP if given.   Person educated: Patient and Spouse Education method: Explanation, Verbal cues, and Handouts Education comprehension: verbalized understanding, verbal cues required, and needs further  education  HOME EXERCISE PROGRAM: Access Code: [email or web address] URL: [email or web address] Date: 11/15/2023 Prepared by: Lang Ada  Exercises - Standing Single Leg Stance with Counter Support  - 1 x daily - 7 x weekly - 1 sets - 3 reps - 30 hold - Sit to Stand with Arms Crossed  - 1 x daily - 7 x weekly - 3 sets - 10 reps - Supine Bridge  - 1 x daily - 7 x weekly - 3 sets - 10 reps - 5 hold  GOALS: Goals reviewed with patient? No  SHORT TERM GOALS: Target date: 12/06/23  Patient will demonstrate evidence of independence with individualized HEP and will report compliance for at least 3 days per week for optimized progression towards remaining  therapy goals. Baseline:  Goal status: INITIAL  2.  Patient will report no falls for the next three weeks for demonstration of increased safety and community awareness for decreased risk of injury. Baseline: pt has fallen 6-8 times in the last 6 months Goal status: INITIAL     LONG TERM GOALS: Target date: 12/27/23  Pt will demonstrate a an increase of at least 15 percent on the ABC scale for improved confidence of balance during community ambulation and ADL. Baseline: see objective Goal status: INITIAL  2.  Pt will improve 2 MWT by 40 feet in order to demonstrate improved functional ambulatory capacity in community setting.  Baseline: see objective Goal status: INITIAL  3.  Pt will demonstrate increased MMT grade in RLE to at least a 4+/5, for increased strength during mobility and maximal efficiency of gait cycle during ambulation. Baseline: see objective Goal status: INITIAL  4.  Pt will demonstrate the ability to get up from the floor in supine position to standing with modified independence and self capable strategies. Baseline: pt does not feel like he would be able to complete task at eval Goal status: INITIAL  5.  Pt will improve 5TSTS by 2.3 seconds in order to improve strength during functional activities. Baseline: see objective Goal status: INITIAL  6. Pt will demonstrate ability to improve SLS to 10 seconds bilaterally for improved balance and decreased fall risk. Baseline: see objective Goal status: INITIAL  ASSESSMENT:  CLINICAL IMPRESSION: Patient is a 73 y.o. male who was seen today for physical therapy evaluation and treatment for I63.9 (ICD-10-CM) - Acute CVA (cerebrovascular accident) (HCC).   Patient demonstrates impaired coordination on LUE, decreased LE strength, abnormal pain rating in shoulders, abnormal gait pattern, and impaired balance. Patient also demonstrates difficulty with ambulation during today's session with decreased stride length,  consistent early heel strike on RLE, and decreased velocity noted although able to complete with no AD this date. Patient also demonstrates ability to navigate stairs with reciprocal pattern with use of one UE on railing ascending and descending. Patient requires education on role of PT, importance of frequent physical activity and importance of HEP compliance. Patient would benefit from skilled physical therapy for increased endurance/efficiency with ambulation, increased LE strength, and balance for improved gait quality, return to higher level of function with ADLs, and progress towards therapy goals.   OBJECTIVE IMPAIRMENTS: Abnormal gait, decreased balance, decreased coordination, decreased endurance, decreased mobility, difficulty walking, decreased strength, impaired sensation, and pain.   ACTIVITY LIMITATIONS: carrying, lifting, bending, squatting, sleeping, transfers, and bed mobility  PARTICIPATION LIMITATIONS: meal prep, cleaning, laundry, driving, shopping, community activity, and yard work  PERSONAL FACTORS: Age, Past/current experiences, Time since onset of injury/illness/exacerbation, and 1-2 comorbidities: heart issues, diabetes are also  affecting patient's functional outcome.   REHAB POTENTIAL: Fair neurological in nature  CLINICAL DECISION MAKING: Stable/uncomplicated  EVALUATION COMPLEXITY: Low  PLAN:  PT FREQUENCY: 1-2x/week  PT DURATION: 6 weeks  PLANNED INTERVENTIONS: 97110-Therapeutic exercises, 97530- Therapeutic activity, 97112- Neuromuscular re-education, 97535- Self Care, [id number]- Manual therapy, [phone]- Gait training, Patient/Family education, Balance training, Stair training, and DME instructions  PLAN FOR NEXT SESSION: ABC scale, reassess gait with tennis shoes and see if pt is able to correct early heel strike that was observed at evaluation, progress functional mobility (bed mobility and floor to standing transfer), progress gait and balance training,  progress LE strengthening especially RLE   Lang Ada, PT, DPT [facility] Office: [phone] 10:07 AM, 11/15/23    [employer] Medicare Auth Request Information Treatment Start Date: 11/16/23  Date of referral: 10/30/23 Referring provider: Noralee Elidia Sieving, MD Referring diagnosis (ICD 10)? I63.9 (ICD-10-CM) - Acute CVA (cerebrovascular accident) (HCC) Treatment diagnosis (ICD 10)? (if different than referring diagnosis) R26.2; Z74.09; Z91.81  What was this (referring dx) caused by? Other: stroke  Lysle of Condition: Initial Onset (within last 3 months)   Laterality: Both  Current Functional Measure Score: Other SLS less than 2 seconds bilaterally  Objective measurements identify impairments when they are compared to normal values, the uninvolved extremity, and prior level of function.  [x]  Yes  []  No  Objective assessment of functional ability: Moderate functional limitations   Briefly describe symptoms: decreased balance, impaired functional mobility, abnormal gait pattern  How did symptoms start: stroke  Average pain intensity:  Last 24 hours: 7/10  Past week: 7/10  How often does the pt experience symptoms? Constantly  How much have the symptoms interfered with usual daily activities? Moderately  How has condition changed since care began at this facility? NA - initial visit  In general, how is the patients overall health? Fair   BACK PAIN (STarT Back Screening Tool) No

## 2023-11-16 ENCOUNTER — Ambulatory Visit: Admitting: Orthopedic Surgery

## 2023-11-16 ENCOUNTER — Other Ambulatory Visit: Payer: Self-pay | Admitting: *Deleted

## 2023-11-16 ENCOUNTER — Encounter: Payer: Self-pay | Admitting: Orthopedic Surgery

## 2023-11-16 DIAGNOSIS — M25512 Pain in left shoulder: Secondary | ICD-10-CM

## 2023-11-16 DIAGNOSIS — M75102 Unspecified rotator cuff tear or rupture of left shoulder, not specified as traumatic: Secondary | ICD-10-CM

## 2023-11-16 DIAGNOSIS — M12812 Other specific arthropathies, not elsewhere classified, left shoulder: Secondary | ICD-10-CM | POA: Diagnosis not present

## 2023-11-16 DIAGNOSIS — G8929 Other chronic pain: Secondary | ICD-10-CM | POA: Diagnosis not present

## 2023-11-16 MED ORDER — METHYLPREDNISOLONE ACETATE 40 MG/ML IJ SUSP
40.0000 mg | Freq: Once | INTRAMUSCULAR | Status: AC
  Administered 2023-11-16: 40 mg via INTRA_ARTICULAR

## 2023-11-16 NOTE — Progress Notes (Signed)
   There were no vitals taken for this visit.  There is no height or weight on file to calculate BMI.  Chief Complaint  Patient presents with   Shoulder Pain    Left     Encounter Diagnosis  Name Primary?   Rotator cuff tear arthropathy of left shoulder Yes    DOI/DOS/ Date: 09/03/23  Unchanged  States has had CVA since last visit here

## 2023-11-16 NOTE — Progress Notes (Signed)
   Chief Complaint  Patient presents with   Shoulder Pain    Left     Problem list, medical hx, medications and allergies reviewed     Chronic left shoulder pain rotator cuff tear previously diagnosed by MRI  Patient had a stroke since he was last seen  Complains of pain left shoulder inability to sleep on his left side  At this point all we could really do to help him is inject the shoulder.  He is not a surgical candidate for at least 6 months and then we would need a neurology evaluation and probably have to do his surgery at higher level of care  He agrees to subacromial injection may repeat in a month  Procedure note the subacromial injection shoulder left   Verbal consent was obtained to inject the  Left   Shoulder  Timeout was completed to confirm the injection site is a subacromial space of the  left  shoulder  Medication used Depo-Medrol  40 mg and lidocaine  1% 3 cc  Anesthesia was provided by ethyl chloride  The injection was performed in the left  posterior subacromial space. After pinning the skin with alcohol and anesthetized the skin with ethyl chloride the subacromial space was injected using a 20-gauge needle. There were no complications  Sterile dressing was applied.

## 2023-11-16 NOTE — Patient Instructions (Signed)
 You have received an injection of steroids into the joint. 15% of patients will have increased pain within the 24 hours postinjection.   This is transient and will go away.   We recommend that you use ice packs on the injection site for 20 minutes every 2 hours and extra strength Tylenol 2 tablets every 8 as needed until the pain resolves.  If you continue to have pain after taking the Tylenol and using the ice please call the office for further instructions.

## 2023-11-16 NOTE — Transitions of Care (Post Inpatient/ED Visit) (Signed)
 Transition of Care week 3  Visit Note  11/16/2023  Name: Jonathan Gilmore MRN: [id number]          DOB: 1950/11/25  Situation: Patient enrolled in [facility] 30-day program. Visit completed with patient by telephone.   HIPAA identifiers x 2 verified  Background:  Hospital admission August 14-17, 2025 for acute CVA without deficit Independent at baseline; supportive spouse assisting as indicated post- recent hospital discharge (1) unplanned hospital admission x last (6) and (12) months  Initial Transition Care Management Follow-up Telephone Call    Past Medical History:  Diagnosis Date   Abnormal cardiovascular stress test 03/2013   Anxiety    Arthritis    Back pain    Cancer (HCC)    Prostate cancer-no treatment per pt   CHF (congestive heart failure) (HCC)    Coronary artery disease    Multivessel with significant LAD involvement by chest CT 2014   Depression    Diastolic dysfunction    Essential hypertension    Lumbar herniated disc    L4-L5   MRSA (methicillin resistant staph aureus) culture positive    Myocardial infarction (HCC)    Neuropathy    Shortness of breath dyspnea    Sleep apnea    No cpap   Type 2 diabetes mellitus (HCC)    Assessment:I am doing great- no problems at all.  Just left the orthopedic shoulder doctor- he gave me a shot for my shoulder issues, it went fine; I am not even having to use the cane any more- my balance is fine and I am steady on my feet.  All of my blood sugars and blood pressures are fine.  My weight with all my clothes on this morning was 177 lbs- if that is up from last week, I don't know why- I have no swelling and absolutely no shortness of breath- it must be because I weighed with my clothes on.  I started the outpatient rehabilitation and it is going just fine    Denies clinical concerns and sounds to be in no distress throughout [facility] 30-day program outreach call today  Patient Reported Symptoms: Cognitive Cognitive Status: Normal  speech and language skills, Alert and oriented to person, place, and time, Insightful and able to interpret abstract concepts Cognitive/Intellectual Conditions Management [RPT]: None reported or documented in medical history or problem list      Neurological Neurological Review of Symptoms: No symptoms reported, Other: Oher Neurological Symptoms/Conditions [RPT]: Recent CVA: Reinforced signs/ symptoms CVA along with BEFAST education/ corresponding action plan: patient continues to verbalize excellent understanding of same with minimal prompting Neurological Management Strategies: Routine screening, Coping strategies, Adequate rest, Weight management  HEENT HEENT Symptoms Reported: No symptoms reported      Cardiovascular Cardiovascular Symptoms Reported: No symptoms reported Other Cardiovascular Symptoms: Confirmed continues to monitor and record daily weights/ blood pressures at home: he is currently just leaving orthopedic provider appointment: reports weight was 177 lbs today; this represents increase from last reported weights of 170-172 lbs: he states he is wearing all of his clothes, denies shortness of breath and lower extremity swelling: reports I am feeling good and doing great; Reinforced rationale for daily weight monitoring at home along with weight gain guidelines/ action plan for weight gain; importance of taking diuretic as prescribed - again reports he is pretty much taking the fluid pill everyday; confirmed patient plans to attend upcoming cardiology office visit on 12/06/23: reinforced need to contact cardiology for ongoing weight gain if it  occurs, as well as shortness of breath/ lower extremity swelling- he verbalizes good understanding of same; declines review of blood pressures today since he is just finishing up with orthopedic provider office visit this morning Does patient have uncontrolled Hypertension?: No Cardiovascular Management Strategies: Routine screening, Coping  strategies, Medication therapy, Diet modification, Fluid modification, Weight management Do You Have a Working Readable Scale?: Yes Weight: 177 lb (80.3 kg) (weight reported with clothes on post- orthopedic appointment this morning- see narrative)  Respiratory Respiratory Symptoms Reported: No symptoms reported Other Respiratory Symptoms: Denies shortness of breath and sounds to be in no distress throughout TOC call Respiratory Management Strategies: Routine screening, Coping strategies, Adequate rest, Weight management  Endocrine Endocrine Symptoms Reported: No symptoms reported Is patient diabetic?: Yes Is patient checking blood sugars at home?: Yes List most recent blood sugar readings, include date and time of day: Confirmed continues to monitor blood sugars at home: reports today all looking good; he declines specific review of home values: just leaving orthopedic provider office; Reinforced blood sugar management at home; need to follow carbohydrate modified diet; take medications as prescribed; continue monitoring/ recording regularly    Gastrointestinal Gastrointestinal Symptoms Reported: No symptoms reported Additional Gastrointestinal Details: Reports good appetite, having normal BM's; reports last BM this morning Gastrointestinal Management Strategies: Coping strategies    Genitourinary Genitourinary Symptoms Reported: Frequency Additional Genitourinary Details: Continues to report urinary frequency due to fluid pills I am taking Genitourinary Management Strategies: Medication therapy, Coping strategies  Integumentary Integumentary Symptoms Reported: No symptoms reported    Musculoskeletal Musculoskelatal Symptoms Reviewed: No symptoms reported Additional Musculoskeletal Details: confirmed not currently requiring/ using assistive devices for ambulation- reports I am walking so good, no balance issues: I am not even having to use the cane anymore; confirmed no new/ recent  falls Musculoskeletal Management Strategies: Routine screening, Coping strategies      Psychosocial Psychosocial Symptoms Reported: No symptoms reported         There were no vitals filed for this visit.  Medications Reviewed Today     Reviewed by Charisa Twitty M, RN (Registered Nurse) on 11/16/23 at 1129  Med List Status: <None>   Medication Order Taking? Sig Documenting Provider Last Dose Status Informant  ACCU-CHEK AVIVA PLUS test strip [id number]  USE AS INSTRUCTED Corum, Olam CROME, MD  Active Spouse/Significant Other, Pharmacy Records  apixaban  (ELIQUIS ) 5 MG TABS tablet [id number]  Take 1 tablet (5 mg total) by mouth 2 (two) times daily. Levora Reyes SAUNDERS, MD  Active   atorvastatin  (LIPITOR ) 80 MG tablet [id number]  Take 1 tablet (80 mg total) by mouth daily. Alvan Dorn FALCON, MD  Active Spouse/Significant Other, Pharmacy Records  Calcium -Magnesium -Zinc (CAL-MAG-ZINC PO) [id number]  Take 1 tablet by mouth daily. [provider]  Active Spouse/Significant Other, Pharmacy Records  furosemide  (LASIX ) 40 MG tablet [id number]  Take 1 tablet (40 mg total) by mouth as needed for edema (swelling).  Patient taking differently: Take 40 mg by mouth as needed for edema (swelling). 10/31/23: Reports during TOC call, has been taking every day   Miriam Norris, NP  Active Spouse/Significant Other, Pharmacy Records  gabapentin  (NEURONTIN ) 300 MG capsule [id number]  TAKE 1 TO 2 CAPSULES BY MOUTH  TWICE DAILY Levora Reyes SAUNDERS, MD  Active Spouse/Significant Other, Pharmacy Records  lisinopril  (ZESTRIL ) 20 MG tablet [id number]  Take 1 tablet (20 mg total) by mouth daily. Levora Reyes SAUNDERS, MD  Active   metFORMIN  (GLUCOPHAGE ) 500 MG tablet [id number]  Take 2 tablets (1,000 mg total)  by mouth 2 (two) times daily with a meal. Levora Reyes SAUNDERS, MD  Active   metoprolol  succinate (TOPROL -XL) 50 MG 24 hr tablet [id number]  TAKE 1 TABLET BY MOUTH DAILY  Patient taking differently: Take 50 mg by mouth every  evening.   Alvan Dorn FALCON, MD  Active Spouse/Significant Other, Pharmacy Records  Multiple Vitamin (MULTIVITAMIN WITH MINERALS) TABS [id number]  Take 1 tablet by mouth daily. [provider]  Active Spouse/Significant Other, Pharmacy Records  nicotine  (NICODERM CQ  - DOSED IN MG/24 HOURS) 21 mg/24hr patch [id number]  Place 1 patch (21 mg total) onto the skin daily. Arrien, Elidia Sieving, MD  Active   nitroGLYCERIN  (NITROSTAT ) 0.4 MG SL tablet [id number]  Place 1 tablet (0.4 mg total) under the tongue every 5 (five) minutes x 3 doses as needed for chest pain (if no relief after 2nd dose, proceed to ED for an evaluation). Alvan Dorn FALCON, MD  Active Spouse/Significant Other, Pharmacy Records  Omega-3 Fatty Acids (OMEGA 3 FISH OIL PO) [id number]  Take 1 capsule by mouth daily. [provider]  Active   pantoprazole  (PROTONIX ) 40 MG tablet [id number]  Take 1 tablet (40 mg total) by mouth 2 (two) times daily. Carlan, Chelsea L, NP  Active Spouse/Significant Other, Pharmacy Records           Recommendation:   PCP Follow-up- as scheduled for establishment of care- new provider: 11/25/23 Specialty provider follow-up cardiology provider 12/06/23 Continue Current Plan of Care  Follow Up Plan:   Telephone follow-up in 1 week- as scheduled 11/24/23  Pls call/ message for questions,  Vanessa Alesi Mckinney Donnielle Addison, RN, BSN, CCRN Alumnus RN Care Manager  Transitions of Care  [facility] - [facility] [phone]: direct office

## 2023-11-16 NOTE — Patient Instructions (Signed)
 Visit Information  Thank you for taking time to visit with me today. Please don't hesitate to contact me if I can be of assistance to you before our next scheduled telephone appointment.  Our next appointment is by telephone on Thursday 11/24/23 at 11:00 am with nurse Mliss and then again on Wednesday 11/30/23 at 11:00 am with nurse Beatris  Please call the care guide team at [phone] if you need to cancel or reschedule your appointment.   Following are the goals we discussed today:  Patient Self Care Activities:  Attend all scheduled provider appointments Call provider office for new concerns or questions  Participate in Transition of Care Program/Attend TOC scheduled calls Take medications as prescribed   Continue pacing activity as your recuperation from recent surgery continues Weigh yourself every day to stay on top of early fluid retention: write down your weights every day so you remember what it is from day to day: follow the weight-gain guidelines and action plan to call your doctor if you gain more than 3 lbs overnight, or 5 lbs in one week Continue monitoring and recording your blood pressures, daily weights, and blood sugars at home Use assistive devices as needed to prevent falls- your walking stick and/ or your new walker Please go over all of your medications with your care providers /doctors Eat a heart healthy and low salt diet If you believe your condition is getting worse- contact your care providers (doctors) promptly- reaching out to your doctor early when you have concerns can prevent you from having to go to the hospital  If you are experiencing a Mental Health or Behavioral Health Crisis or need someone to talk to, please  call the Suicide and Crisis Lifeline: 988 call the [place]  National Suicide Prevention Lifeline: (316) 759-1747 or TTY: 8170252764 TTY 807 883 0214) to talk to a trained counselor call 1-800-273-TALK (toll free, 24 hour hotline) go to [facility] [address] [place] [phone]) call the [facility] Crisis Line: [phone] call 911   Patient verbalizes understanding of instructions and care plan provided today and agrees to view in MyChart. Active MyChart status and patient understanding of how to access instructions and care plan via MyChart confirmed with patient.     Laresha Bacorn Mckinney Sritha Chauncey, RN, BSN, Media planner  Transitions of Care  [facility] - [facility] [phone]: direct office

## 2023-11-17 ENCOUNTER — Encounter (HOSPITAL_COMMUNITY): Admitting: Occupational Therapy

## 2023-11-18 ENCOUNTER — Ambulatory Visit (HOSPITAL_COMMUNITY)

## 2023-11-18 DIAGNOSIS — M25612 Stiffness of left shoulder, not elsewhere classified: Secondary | ICD-10-CM

## 2023-11-18 DIAGNOSIS — Z7409 Other reduced mobility: Secondary | ICD-10-CM

## 2023-11-18 DIAGNOSIS — R262 Difficulty in walking, not elsewhere classified: Secondary | ICD-10-CM

## 2023-11-18 NOTE — Therapy (Signed)
 OUTPATIENT PHYSICAL THERAPY NEURO TREATMENT   Patient Name: Jonathan Gilmore MRN: [id number] DOB:02/27/1951, 73 y.o., male Today's Date: 11/18/2023     END OF SESSION:  PT End of Session - 11/18/23 0811     Visit Number 2    Number of Visits 12    Date for PT Re-Evaluation 12/27/23    Authorization Type [employer] MEDICARE    Authorization Time Period uhc approved 12 visits from 11/15/23-12/27/23 [phone]    Authorization - Visit Number 2    Authorization - Number of Visits 12    Progress Note Due on Visit 10    PT Start Time 0715    PT Stop Time 0755    PT Time Calculation (min) 40 min    Equipment Utilized During Treatment Gait belt    Activity Tolerance Patient tolerated treatment well    Behavior During Therapy WFL for tasks assessed/performed           Past Medical History:  Diagnosis Date   Abnormal cardiovascular stress test 03/2013   Anxiety    Arthritis    Back pain    Cancer (HCC)    Prostate cancer-no treatment per pt   CHF (congestive heart failure) (HCC)    Coronary artery disease    Multivessel with significant LAD involvement by chest CT 2014   Depression    Diastolic dysfunction    Essential hypertension    Lumbar herniated disc    L4-L5   MRSA (methicillin resistant staph aureus) culture positive    Myocardial infarction (HCC)    Neuropathy    Shortness of breath dyspnea    Sleep apnea    No cpap   Type 2 diabetes mellitus (HCC)    Past Surgical History:  Procedure Laterality Date   ANTERIOR CERVICAL DECOMP/DISCECTOMY FUSION  01/18/2011   Procedure: ANTERIOR CERVICAL DECOMPRESSION/DISCECTOMY FUSION 2 LEVELS;  Surgeon: Victory LABOR Pool;  Location: [facility];  Service: Neurosurgery;  Laterality: N/A;  anterior cervical discectomy and fusion with allograft and plating, cervical five-six, cervical six-seven   BIOPSY N/A 04/04/2015   Procedure: BIOPSY;  Surgeon: Claudis RAYMOND Rivet, MD;  Location: [facility];  Service: Endoscopy;  Laterality: N/A;    BIOPSY  11/09/2019   Procedure: BIOPSY;  Surgeon: Eartha Angelia Sieving, MD;  Location: [facility];  Service: Gastroenterology;;   Bone spur removed  1997   Left shoulder   CARDIAC CATHETERIZATION N/A 08/08/2015   Procedure: Right/Left Heart Cath and Coronary Angiography;  Surgeon: Ozell Fell, MD;  Location: [facility];  Service: Cardiovascular;  Laterality: N/A;   CARPAL TUNNEL RELEASE Right    COLONOSCOPY WITH PROPOFOL  N/A 11/09/2019   Procedure: COLONOSCOPY WITH PROPOFOL ;  Surgeon: Eartha Angelia Sieving, MD;  Location: [facility];  Service: Gastroenterology;  Laterality: N/A;  900   COLONOSCOPY WITH PROPOFOL  N/A 02/27/2021   Procedure: COLONOSCOPY WITH PROPOFOL ;  Surgeon: Eartha Angelia Sieving, MD;  [facility]: [facility];  Service: Gastroenterology;  Laterality: N/A;  7:30   COLONOSCOPY WITH PROPOFOL  N/A 04/05/2022   Procedure: COLONOSCOPY WITH PROPOFOL ;  Surgeon: Shila Gustav GAILS, MD;  Location: [facility];  Service: Gastroenterology;  Laterality: N/A;   ENTEROSCOPY N/A 06/30/2022   Procedure: ENTEROSCOPY;  Surgeon: Eartha Angelia Sieving, MD;  [facility]: [facility];  Service: Gastroenterology;  Laterality: N/A;  2:00pm;ASA 3   ESOPHAGEAL DILATION N/A 04/04/2015   Procedure: ESOPHAGEAL DILATION;  Surgeon: Claudis RAYMOND Rivet, MD;  [facility]: [facility];  Service: Endoscopy;  Laterality:  N/A;   ESOPHAGEAL DILATION N/A 11/09/2019   Procedure: ESOPHAGEAL DILATION;  Surgeon: Eartha Angelia Sieving, MD;  [facility];  Service: Gastroenterology;  Laterality: N/A;   ESOPHAGOGASTRODUODENOSCOPY N/A 04/04/2015   Procedure: ESOPHAGOGASTRODUODENOSCOPY (EGD);  Surgeon: Claudis RAYMOND Rivet, MD;  [facility];  Service: Endoscopy;  Laterality: N/A;  1240   ESOPHAGOGASTRODUODENOSCOPY (EGD) WITH PROPOFOL  N/A 11/09/2019   Procedure: ESOPHAGOGASTRODUODENOSCOPY (EGD) WITH PROPOFOL ;  Surgeon: Eartha Angelia Sieving, MD;  [facility];  Service:  Gastroenterology;  Laterality: N/A;   ESOPHAGOGASTRODUODENOSCOPY (EGD) WITH PROPOFOL  N/A 04/04/2022   Procedure: ESOPHAGOGASTRODUODENOSCOPY (EGD) WITH PROPOFOL ;  Surgeon: Leigh Elspeth SQUIBB, MD;  Location: [facility] ENDOSCOPY;  Service: Gastroenterology;  Laterality: N/A;   ESOPHAGOGASTRODUODENOSCOPY (EGD) WITH PROPOFOL   06/30/2022   Procedure: ESOPHAGOGASTRODUODENOSCOPY (EGD) WITH PROPOFOL ;  Surgeon: Eartha Angelia Sieving, MD;  [facility];  Service: Gastroenterology;;   GIVENS CAPSULE STUDY N/A 05/11/2022   Procedure: GIVENS CAPSULE STUDY;  Surgeon: Eartha Angelia Sieving, MD;  [facility];  Service: Gastroenterology;  Laterality: N/A;  7:30 am   HEMORRHOID SURGERY     HOT HEMOSTASIS  06/30/2022   Procedure: HOT HEMOSTASIS (ARGON PLASMA COAGULATION/BICAP);  Surgeon: Eartha Angelia, Sieving, MD;  [facility]: [facility];  Service: Gastroenterology;;   POLYPECTOMY  11/09/2019   Procedure: POLYPECTOMY;  Surgeon: Eartha Angelia Sieving, MD;  [facility];  Service: Gastroenterology;;   POLYPECTOMY  02/27/2021   Procedure: POLYPECTOMY;  Surgeon: Eartha Angelia Sieving, MD;  [facility];  Service: Gastroenterology;;   POLYPECTOMY  04/05/2022   Procedure: POLYPECTOMY;  Surgeon: Shila Gustav GAILS, MD;  Location: [facility] ENDOSCOPY;  Service: Gastroenterology;;   TONSILLECTOMY     Patient Active Problem List   Diagnosis Date Noted   Acute CVA (cerebrovascular accident) (HCC) 10/28/2023   Paroxysmal atrial fibrillation (HCC) 10/28/2023   Hypokalemia 10/28/2023   Stroke (cerebrum) (HCC) 10/27/2023   Stroke (HCC) 10/27/2023   GERD (gastroesophageal reflux disease) 04/27/2023   Iron deficiency 04/08/2023   Belching 02/08/2023   Hx of fusion of cervical spine 11/22/2022   AVM (arteriovenous malformation) of small bowel, acquired 06/30/2022   Iron deficiency anemia due to chronic blood loss 04/22/2022   History of esophagitis 04/22/2022   Heme  positive stool 04/04/2022   Iron deficiency anemia 04/04/2022   Acute blood loss anemia 04/02/2022   Prostate cancer (HCC) 06/08/2021   History of colonic polyps 02/09/2021   Intestinal metaplasia of antrum of stomach without dysplasia 02/09/2021   Insomnia 03/12/2019   Pure hypercholesterolemia 11/29/2018   Hypogonadism, male 11/23/2018   Essential hypertension, benign 11/13/2018   High risk medication use 10/28/2018   Carotid stenosis, asymptomatic, bilateral 10/28/2018   Tobacco use 10/28/2018   Polyp of colon 10/28/2018   Lumbar disc herniation with radiculopathy 12/21/2017   Spinal stenosis of lumbar region without neurogenic claudication 12/21/2017   CAD -50% LAD  08/09/2015   Moderate aortic stenosis 08/09/2015   Acute pulmonary edema (HCC) 08/07/2015   Acute diastolic CHF (congestive heart failure) (HCC) 08/07/2015   Hypersomnia with sleep apnea 09/20/2013   Syncope 09/19/2013   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   OSA (obstructive sleep apnea) -non compliant 09/19/2013   Narcolepsy and cataplexy 09/19/2013   Facial abscess 04/28/2013   Facial cellulitis 04/28/2013   Mixed hyperlipidemia 04/28/2013   Tobacco dependence 04/28/2013   Sepsis (HCC) 04/28/2013   Gout 04/28/2013   Chronic diastolic CHF (congestive heart failure) (HCC) 04/28/2013   Pleuritic pain 02/24/2013  Chest pain 02/24/2013   Murmur 02/24/2013   Pericarditis 02/24/2013   Type 2 diabetes mellitus with hyperlipidemia (HCC) 02/24/2013   Essential hypertension 02/24/2013   Cervical spondylosis with myelopathy 01/19/2011   PCP: Levora Reyes SAUNDERS, MD  REFERRING PROVIDER: Noralee Elidia Sieving, MD ONSET DATE: 11/03/23  REFERRING DIAG: I63.9 (ICD-10-CM) - Acute CVA (cerebrovascular accident) (HCC)  THERAPY DIAG:  Difficulty in walking, not elsewhere classified  Impaired functional mobility, balance, and endurance  Stiffness of left shoulder, not elsewhere classified  Rationale for  Evaluation and Treatment: Rehabilitation  SUBJECTIVE:                                                                                                                                                                                             SUBJECTIVE STATEMENT: Pt reports shoulder and back were about 6-7/10 when he came in this morning. No falls since last visit. Doing exercises at home except for the bridges. Daughter coming into town this weekend.   (Initial) Pt states he lost balance when stroke occurred, was not using cane before the stroke happened. Pt states he was outiside weedeating when the stroke occurred and fell several times, was crawling around due to imbalance/dizziness. Pt denies any weakness or numbness related to the stroke, does have neuropahty due to diabetes. Pts wife states pt was leaning to the right side and right eye was drooping. Pt accompanied by: self and significant other  PERTINENT HISTORY:  Diabetes, neuropathy CHF Aortic Valve issue  PAIN:  Are you having pain? Yes: NPRS scale: 7/10 Pain location: bilateral shoulder Pain description: aches, sharp pain with movement Aggravating factors: sleeping, moving Relieving factors: laying on back  PRECAUTIONS: Fall  RED FLAGS: None   WEIGHT BEARING RESTRICTIONS: No  FALLS: Has patient fallen in last 6 months? Yes. Number of falls 6-8  LIVING ENVIRONMENT: Lives with: lives with their spouse Lives in: House/apartment Stairs: Yes: External: 2 steps; on right going up Has following equipment at home: Single point cane and Walker - 2 wheeled  PLOF: Independent and Independent with basic ADLs  PATIENT GOALS: improve balance and walking with LRAD  OBJECTIVE:  Note: Objective measures were completed at Evaluation unless otherwise noted.  DIAGNOSTIC FINDINGS:  EXAM: MRI BRAIN WITHOUT CONTRAST 10/27/2023 04:34:27 PM   TECHNIQUE: Multiplanar multisequence MRI of the head/brain was performed without  the administration of intravenous contrast.   COMPARISON: MRI of the brain dated 09/10/2000.   CLINICAL HISTORY: Syncope/presyncope, cerebrovascular cause suspected. Syncope; Limited positioning, pt combative.   FINDINGS:   BRAIN AND VENTRICLES: There is a new area of restricted diffusion present medially within the  right cerebellar hemisphere. There is mild cerebellar swelling with partial effacement of the fourth ventricle. There is mild periventricular and deep cerebral white matter disease present.   ORBITS: No acute abnormality.   SINUSES AND MASTOIDS: Polypoidal mucosal disease present within the floors of the maxillary sinuses.   BONES AND SOFT TISSUES: Normal marrow signal. No acute soft tissue abnormality.   IMPRESSION: 1. New area of restricted diffusion medially within the right cerebellar hemisphere with mild cerebellar swelling and partial effacement of the fourth ventricle, consistent with Nonhemorrhagic acute infarct. 2. Mild periventricular and deep cerebral white matter disease.   Electronically signed by: evalene coho 10/27/2023 05:20 PM EDT RP Workstation: [id number]  COGNITION: Overall cognitive status: Pts significant other reports changes in cognition although pt denies any change   SENSATION: Light touch: Impaired  Toes  COORDINATION: Pt demonstrates abnormal finger to nose testing with LUE  DTRs:  Not assessed this date   LOWER EXTREMITY ROM:     Active  Right Eval Left Eval  Hip flexion    Hip extension    Hip abduction    Hip adduction    Hip internal rotation    Hip external rotation    Knee flexion    Knee extension    Ankle dorsiflexion    Ankle plantarflexion    Ankle inversion    Ankle eversion     (Blank rows = not tested)  LOWER EXTREMITY MMT:    MMT Right Eval Left Eval  Hip flexion 4 4  Hip extension    Hip abduction 4 4  Hip adduction 4 4  Hip internal rotation    Hip external rotation    Knee  flexion 4 4  Knee extension 4 4  Ankle dorsiflexion 3+ 3+  Ankle plantarflexion    Ankle inversion    Ankle eversion    (Blank rows = not tested)    STAIRS: Findings: Stair Negotiation Technique: Alternating Pattern  with Single Rail on Left, Number of Stairs: 4, Height of Stairs: 8 inches   , and Comments: pt demonstrates good LE strength with alternating pattern but increased time required GAIT: Findings: Gait Characteristics: decreased stride length, wide BOS, and poor foot clearance- Right, Distance walked: 270 feet, Assistive device utilized:cane held in hand but did not use it and None, and Comments: pt demonstrates consistent early heel strike on RLE and occassional early heel strike on LLE. Pt states it is because of the shoewear, will reassess next visit with pt instructed to wear sneakers next session.   FUNCTIONAL TESTS:  5 times sit to stand: 11.53 seconds, minimal UE support 2 minute walk test: 270 ft SLS 11/15/23: R: 1.5 seconds L: 0.96 seconds  PATIENT SURVEYS:  ABC scale: 930/1600 - 58.1%                                                                                                                              TREATMENT DATE:  11/18/23 Transfer training Sit to stand w/ arms crossed  Balance gait training integration Step up and over hurdle to airex with alternating SL hip flexion for improving functional foot clearance and SLS engagement Required UE A for maintaining balance/stability Side step over hurdle with cues for maintaining upright and for improving foot clearance and weight shift balance integration Required UE A and CGA for maintaining balance Ball roll out (20 x each) Improving lumbopelvic mobility and to mitigate pain Review of goals/education assessment with ABC scale Gait training 2 laps around clinic with cues for heel strike and foot clearance - maintained clearance and heel strike 30% of the time with L LE landing midfoot and ER > R LE landing heel  strike  Verbal cues for stepping through mud as well as stepping over objects Heel / toe raises  10 each with education for performance at home in order to improve functional foot clearance during gait to reduce fall risk   11/15/2023  Evaluation: -ROM measured, Strength assessed, HEP prescribed, pt educated on prognosis, findings, and importance of HEP compliance if given.   PATIENT EDUCATION: Education details: Pt was educated on findings of PT evaluation, prognosis, frequency of therapy visits and rationale, attendance policy, and HEP if given.   Person educated: Patient and Spouse Education method: Explanation, Verbal cues, and Handouts Education comprehension: verbalized understanding, verbal cues required, and needs further education  HOME EXERCISE PROGRAM: Access Code: [email or web address] URL: [email or web address] Date: 11/15/2023 Prepared by: Lang Ada  Exercises - Standing Single Leg Stance with Counter Support  - 1 x daily - 7 x weekly - 1 sets - 3 reps - 30 hold - Sit to Stand with Arms Crossed  - 1 x daily - 7 x weekly - 3 sets - 10 reps - Supine Bridge  - 1 x daily - 7 x weekly - 3 sets - 10 reps - 5 hold  Access Code: [email or web address] URL: [email or web address] Date: 11/18/2023 Prepared by: Lamarr Citrin  Exercises - Heel Toe Raises with Counter Support  - 1 x daily - 7 x weekly - 3 sets - 10 reps  GOALS: Goals reviewed with patient? Yes  SHORT TERM GOALS: Target date: 12/06/23  Patient will demonstrate evidence of independence with individualized HEP and will report compliance for at least 3 days per week for optimized progression towards remaining therapy goals. Baseline:  Goal status: INITIAL  2.  Patient will report no falls for the next three weeks for demonstration of increased safety and community awareness for decreased risk of injury. Baseline: pt has fallen 6-8 times in the last 6 months Goal status: INITIAL     LONG TERM GOALS:  Target date: 12/27/23  Pt will demonstrate a an increase of at least 15 percent on the ABC scale for improved confidence of balance during community ambulation and ADL. Baseline: see objective Goal status: INITIAL  2.  Pt will improve 2 MWT by 40 feet in order to demonstrate improved functional ambulatory capacity in community setting.  Baseline: see objective Goal status: INITIAL  3.  Pt will demonstrate increased MMT grade in RLE to at least a 4+/5, for increased strength during mobility and maximal efficiency of gait cycle during ambulation. Baseline: see objective Goal status: INITIAL  4.  Pt will demonstrate the ability to get up from the floor in supine position to standing with modified independence and self capable strategies. Baseline: pt does not feel like he would be able to complete task at eval Goal status: INITIAL  5.  Pt will improve 5TSTS by 2.3 seconds in order to improve strength during functional activities. Baseline: see objective Goal status: INITIAL  6. Pt will demonstrate ability to improve SLS to 10 seconds bilaterally for improved balance and decreased fall risk. Baseline: see objective Goal status: INITIAL  ASSESSMENT:  CLINICAL IMPRESSION:  Initiated session with transfer training followed by balance integration interventions. Cues and UE A needed to maintain stability in standing and with functional balance on airex. Increased c/o pain in hips post 2nd set of side stepping therefore provided rest break and performed ABC assessment and goals reviewed. Addition of heel/toe raises for HEP post gait performance with notable decreased functional heel strike during gait training noted. Recommend continued skilled PT services in order to improve safety and balance with gait and routine ADL performance.   (Eval) Patient is a 73 y.o. male who was seen today for physical therapy evaluation and treatment for I63.9 (ICD-10-CM) - Acute CVA (cerebrovascular accident)  (HCC).   Patient demonstrates impaired coordination on LUE, decreased LE strength, abnormal pain rating in shoulders, abnormal gait pattern, and impaired balance. Patient also demonstrates difficulty with ambulation during today's session with decreased stride length, consistent early heel strike on RLE, and decreased velocity noted although able to complete with no AD this date. Patient also demonstrates ability to navigate stairs with reciprocal pattern with use of one UE on railing ascending and descending. Patient requires education on role of PT, importance of frequent physical activity and importance of HEP compliance. Patient would benefit from skilled physical therapy for increased endurance/efficiency with ambulation, increased LE strength, and balance for improved gait quality, return to higher level of function with ADLs, and progress towards therapy goals.   OBJECTIVE IMPAIRMENTS: Abnormal gait, decreased balance, decreased coordination, decreased endurance, decreased mobility, difficulty walking, decreased strength, impaired sensation, and pain.   ACTIVITY LIMITATIONS: carrying, lifting, bending, squatting, sleeping, transfers, and bed mobility  PARTICIPATION LIMITATIONS: meal prep, cleaning, laundry, driving, shopping, community activity, and yard work  PERSONAL FACTORS: Age, Past/current experiences, Time since onset of injury/illness/exacerbation, and 1-2 comorbidities: heart issues, diabetes are also affecting patient's functional outcome.   REHAB POTENTIAL: Fair neurological in nature  CLINICAL DECISION MAKING: Stable/uncomplicated  EVALUATION COMPLEXITY: Low  PLAN:  PT FREQUENCY: 1-2x/week  PT DURATION: 6 weeks  PLANNED INTERVENTIONS: 97110-Therapeutic exercises, 97530- Therapeutic activity, 97112- Neuromuscular re-education, 97535- Self Care, [id number]- Manual therapy, [phone]- Gait training, Patient/Family education, Balance training, Stair training, and DME  instructions  PLAN FOR NEXT SESSION: progress functional mobility (bed mobility and floor to standing transfer), progress gait and balance training, progress LE strengthening especially RLE  Lamarr LITTIE Bettina ALMETA, DPT [facility] [place] [phone] office  8:24 AM, 11/18/23  [facility] Medicare Auth Request Information Treatment Start Date: 11/16/23  Date of referral: 10/30/23 Referring provider: Noralee Elidia Sieving, MD Referring diagnosis (ICD 10)? I63.9 (ICD-10-CM) - Acute CVA (cerebrovascular accident) (HCC) Treatment diagnosis (ICD 10)? (if different than referring diagnosis) R26.2; Z74.09; Z91.81  What was this (referring dx) caused by? Other: stroke  Lysle of Condition: Initial Onset (within last 3 months)   Laterality: Both  Current Functional Measure Score: Other SLS less than 2 seconds bilaterally  Objective measurements identify impairments when they are compared to normal values, the uninvolved extremity, and prior level of function.  [x]  Yes  []  No  Objective assessment of functional ability: Moderate functional limitations   Briefly describe symptoms: decreased balance, impaired functional mobility, abnormal gait pattern  How did  symptoms start: stroke  Average pain intensity:  Last 24 hours: 7/10  Past week: 7/10  How often does the pt experience symptoms? Constantly  How much have the symptoms interfered with usual daily activities? Moderately  How has condition changed since care began at this facility? NA - initial visit  In general, how is the patients overall health? Fair   BACK PAIN (STarT Back Screening Tool) No

## 2023-11-21 ENCOUNTER — Ambulatory Visit: Attending: Nurse Practitioner | Admitting: Nurse Practitioner

## 2023-11-21 ENCOUNTER — Encounter: Payer: Self-pay | Admitting: Nurse Practitioner

## 2023-11-21 VITALS — BP 110/60 | HR 78 | Ht 64.0 in | Wt 174.0 lb

## 2023-11-21 DIAGNOSIS — Z720 Tobacco use: Secondary | ICD-10-CM

## 2023-11-21 DIAGNOSIS — I5189 Other ill-defined heart diseases: Secondary | ICD-10-CM | POA: Diagnosis not present

## 2023-11-21 DIAGNOSIS — I10 Essential (primary) hypertension: Secondary | ICD-10-CM

## 2023-11-21 DIAGNOSIS — I2510 Atherosclerotic heart disease of native coronary artery without angina pectoris: Secondary | ICD-10-CM

## 2023-11-21 DIAGNOSIS — I350 Nonrheumatic aortic (valve) stenosis: Secondary | ICD-10-CM

## 2023-11-21 DIAGNOSIS — E785 Hyperlipidemia, unspecified: Secondary | ICD-10-CM

## 2023-11-21 DIAGNOSIS — I5032 Chronic diastolic (congestive) heart failure: Secondary | ICD-10-CM | POA: Diagnosis not present

## 2023-11-21 NOTE — Progress Notes (Signed)
 Cardiology Office Note:    Date:  11/21/2023 ID:  Jonathan Gilmore, DOB 1950/11/07, MRN [id number] PCP:  Aletha Bene, MD [facility] Providers Cardiologist:  [name], MD    Referring MD: Levora Reyes SAUNDERS, MD   CC: Here for hospital follow-up  History of Present Illness:    Jonathan Gilmore is a 73 y.o. male with a PMH of chronic diastolic CHF, CAD, hyperlipidemia, hypertension, type 2 diabetes, PAD, history of claudication, aortic valve stenosis, neuropathy, and history of prostate cancer, who presents today for hospital follow-up.  CT scan of chest in 2014 showed three-vessel CAD, severe LAD disease.  NST in 2015 was negative for ischemia.  Was admitted in May 2017 with acute pulmonary edema, in setting of severe hypertension.  Was referred to cath.  In 2017 had a cardiac catheterization performed that showed 50% LAD disease, overall patent coronaries.  It was found that his presentation with pulmonary edema was likely secondary to severe hypertension in setting of restrictive diastolic dysfunction.  Echocardiogram at that time revealed EF mildly reduced at 45 to 50%, restrictive diastolic dysfunction, repeat echocardiogram in 2022 showed EF improved to 60 to 65%, mild to moderate aortic stenosis with mean gradient at 17 mmHg.  NST in 2022 showed small inferior infarct, no current ischemia.  Last seen by Dr. Alvan on 08/23/2023. Was overall doing well. GXT was arranged to determine exercise tolerance d/t his aortic valve stenosis. See results below. Echo was arranged. Showed LVEF normal, moderate LVH, mild MR, mild AR, severe aortic valve stenosis with mean gradient measuring 20.0 mmHg, evidence of atrial level shunting with moderate sized secundum ASD with left to right shunting across atrial septum.   Hospitalized in August 2025 due to acute CVA.  Was working in backyard and had sensation of legs giving out, vertigo.  Had not been taking Eliquis  for over 1 week.  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  Echo revealed normal LVEF with severe aortic stenosis and atrial level shunting with moderate size secundum of atrial septal defect with predominant left-to-right shunting across the atrial septum.  Today he presents for hospital follow-up.  Tells me he is weaning off of nicotine .  Does admit to fatigue and shortness of breath. Denies any chest pain, palpitations, syncope, presyncope, dizziness, orthopnea, PND, swelling or significant weight changes, acute bleeding, or claudication.  ROS:   Review of Systems  Constitutional: Negative.   HENT: Negative.    Eyes: Negative.   Respiratory: Negative.    Cardiovascular: Negative.   Gastrointestinal: Negative.   Genitourinary: Negative.   Musculoskeletal: Negative.   Skin: Negative.   Neurological: Negative.   Endo/Heme/Allergies: Negative.   Psychiatric/Behavioral: Negative.      Please see the history of present illness.    All other systems reviewed and are negative.  EKGs/Labs/Other Studies Reviewed:    The following studies were reviewed today:   EKG:  EKG is not ordered today.  Echo 10/2023:   1. Left ventricular ejection fraction, by estimation, is 65 to 70%. The  left ventricle has normal function. The left ventricle has no regional  wall motion abnormalities. There is moderate concentric left  ventricular  hypertrophy. Left ventricular  diastolic parameters are indeterminate.   2. Right ventricular systolic function is normal. The right ventricular  size is normal. Tricuspid regurgitation signal is inadequate for assessing  PA pressure.   3. Left atrial size was moderately dilated.   4. The mitral valve is degenerative. Mild mitral valve regurgitation.   5. The aortic valve has an indeterminant number of cusps. There is severe  calcifcation of the aortic valve. Aortic valve regurgitation is mild.  Severe aortic valve stenosis, paradoxical normal flow/low gradient. Aortic  valve mean gradient measures 20.0  mmHg. Dimentionless index 0.23.   6. The inferior vena cava is normal in size with <50% respiratory  variability, suggesting right atrial pressure of 8 mmHg.   7. Evidence of atrial level shunting detected by color flow Doppler.  There is a moderately sized secundum atrial septal defect with  predominantly left to right shunting across the atrial septum.   Comparison(s): Prior images reviewed side by side. Severe paradoxical  normal flow/low gradient aortic stenosis, mean AV gradient 20 mmHg and DI  0.23.  Exercise Tolerance Test 09/2023:    Patient exercised for 1 minute 48 seconds, per Bruce protocol, achieving 4.60 METS.  Did not reach target heart rate (84% of MAPHR). Poor exercise tolerance.   No angina during the test. Test was stopped due to shortness of breath.   Baseline EKG showed atrial fibrillation, ST depressions in II, III, aVF, V5 and V6. ST depressions in inferior leads were exaggerated during recovery and returned to baseline by the end of the test. Non-diagnostic for ischemia.   Echocardiogram on 04/03/2022: 1. Left ventricular ejection fraction, by estimation, is 55 to 60%. The  left ventricle has normal function. The left ventricle demonstrates  regional wall motion abnormalities (see scoring diagram/findings for  description). There is moderate left   ventricular hypertrophy. Left ventricular diastolic parameters are  consistent with Grade I diastolic dysfunction (impaired relaxation).   2. Right ventricular systolic function is normal. The right ventricular  size is normal.   3. Left atrial size was severely dilated.   4. Right atrial size was mildly dilated.   5. The mitral valve is normal in structure. Mild mitral valve  regurgitation. No evidence of mitral stenosis.   6. Prior AV mean gradient 17 mmHg. The aortic valve is calcified. There  is moderate calcification of the aortic valve. There is moderate  thickening of the aortic valve. Aortic valve regurgitation is not  visualized. Moderate aortic valve stenosis. Aortic   valve mean gradient measures 23.0 mmHg. Aortic valve Vmax measures 3.08  m/s.   7. The inferior vena cava is dilated in size with <50% respiratory  variability, suggesting right atrial pressure of 15 mmHg.   8. Evidence of atrial level shunting detected by color flow Doppler.  There is a small patent foramen ovale with predominantly left to right  shunting across the atrial septum.   Comparison(s): Prior images reviewed side by side.  ABI's on 02/03/2022: Summary:  Right: Resting right ankle-brachial index indicates moderate right lower  extremity arterial disease. The right toe-brachial index is abnormal.   Left: Resting left ankle-brachial index indicates severe left lower  extremity arterial disease. The left toe-brachial index is abnormal.  Myoview  on 03/02/2021:  Findings are consistent with prior myocardial infarction. The study is low risk.   1 mm down sloping ST depression in the inferolateral leads was noted with exercise.  Patient did not achieve target HR and was switched to Lexiscan .  There was no significant ST deviation during Lexiscan  infusion.   Left ventricular function is abnormal. Global function is mildly reduced. Nuclear stress EF: 50 %. The left ventricular ejection fraction is mildly  decreased (45-54%). End diastolic cavity size is mildly enlarged.   Abnormal myovue with motion and diaphragmatic attenuation Possible small inferior wall infarct from apex to base No ischemia EF 50% with inferior and apical hypokinesis   Right and left heart cath on 08/08/2015: Prox LAD to Mid LAD lesion, 50% stenosed.   1. Calcified moderate proximal-mid LAD stenosis 2. Widely patent, dominant LCx 3. Preserved cardiac output, mildly elevated right-sided intracardiac pressures   Recommend: Medical therapy for nonobstructive CAD and CHF  Recent Labs: 10/27/2023: ALT 16; TSH 1.615 11/02/2023: BUN 12; Creatinine, Ser 0.61; Hemoglobin 14.4; Magnesium  1.5; Platelets 248.0; Potassium 4.2; Sodium 138  Recent Lipid Panel    Component Value Date/Time   CHOL 112 10/28/2023 0455   TRIG 60 10/28/2023 0455   HDL 34 (L) 10/28/2023 0455   CHOLHDL 3.3 10/28/2023 0455   VLDL 12 10/28/2023 0455   LDLCALC 66 10/28/2023 0455   LDLCALC 45 05/06/2020 1325     Physical Exam:    VS:  BP 110/60   Pulse 78   Ht 5' 4 (1.626 m)   Wt 174 lb (78.9 kg)   SpO2 96%   BMI 29.87 kg/m     Wt Readings from Last 3 Encounters:  11/25/23 176 lb 2 oz (79.9 kg)  11/24/23 174 lb (78.9 kg)  11/21/23 174 lb (78.9 kg)     GEN: Obese, 73 y.o. male in no acute distress HEENT: Normal NECK: No JVD; No carotid bruits CARDIAC: S1/S2, RRR, Grade 3 murmur, no rubs, no gallops; 2+ pulses RESPIRATORY:  Clear to auscultation without rales, wheezing or rhonchi  MUSCULOSKELETAL:  No edema; No deformity  SKIN: Warm and dry NEUROLOGIC:  Alert and oriented x 3 PSYCHIATRIC:  Normal affect   ASSESSMENT & PLAN:    In order of problems listed above:  Severe aortic valve stenosis and structural heart disease Most recent echocardiogram revealed severe aortic valve stenosis with paradoxical normal flow/low gradient with aortic valve mean gradient measuring 20.0 mmHg.  He also has evidence of atrial level shunting that was  detected by color-flow Doppler with moderately sized secundum ASD noted predominantly with left-to-right shunting across the atrial septum.  He is symptomatic with this and admits to fatigue and shortness of breath.  Will place referral to structural heart team for further evaluation.   2. Chronic diastolic CHF Stage C, NYHA class I-II symptoms.  EF 65 to 70%. Euvolemic and well compensated on exam.  Continue current medication regimen. Low sodium diet, fluid restriction <2L, and daily weights encouraged. Educated to contact our office for weight gain of 2 lbs overnight or 5 lbs in one week. Heart healthy diet and regular cardiovascular exercise encouraged.   3. CAD 2017 cardiac catheterization showed moderate CAD, 50% LAD lesion. NST in 2022 was negative for ischemia.  Was ruled out for MI after presentation to ER.  Symptoms of chest pressure represented demand ischemia in setting of severe anemia and aortic stenosis.  Denies any recent chest pains.  Not on aspirin  due to being on Eliquis .  Continue current  medication regimen. Heart healthy diet and regular cardiovascular exercise encouraged.   4. HTN Blood pressure stable today.  BP well-controlled at home.  Continue current medication regimen. Discussed to monitor BP at home at least 2 hours after medications and sitting for 5-10 minutes. Heart healthy diet and regular cardiovascular exercise encouraged.   5. HLD Most recent LDL level 66.  Continue current medication regimen. Heart healthy diet and regular cardiovascular exercise encouraged.   6. Tobacco abuse Smoking cessation encouraged and discussed.   7. Disposition: Patient is requesting to follow-up with Dr. Alvan for first available appointment.  Will arrange.  Medication Adjustments/Labs and Tests Ordered: Current medicines are reviewed at length with the patient today.  Concerns regarding medicines are outlined above.  Orders Placed This Encounter  Procedures   Ambulatory referral  to Cardiology   No orders of the defined types were placed in this encounter.   Patient Instructions  Medication Instructions:  Your physician recommends that you continue on your current medications as directed. Please refer to the Current Medication list given to you today.  Labwork: None   Testing/Procedures: None   Follow-Up: Your physician recommends that you schedule a follow-up appointment in:  1st available with Dr.Branch   Any Other Special Instructions Will Be Listed Below (If Applicable).  If you need a refill on your cardiac medications before your next appointment, please call your pharmacy.   Signed, [name], NP

## 2023-11-21 NOTE — Patient Instructions (Addendum)
 Medication Instructions:  Your physician recommends that you continue on your current medications as directed. Please refer to the Current Medication list given to you today.  Labwork: None   Testing/Procedures: None   Follow-Up: Your physician recommends that you schedule a follow-up appointment in:  1st available with Dr.Branch   Any Other Special Instructions Will Be Listed Below (If Applicable).  If you need a refill on your cardiac medications before your next appointment, please call your pharmacy.

## 2023-11-23 ENCOUNTER — Ambulatory Visit (HOSPITAL_COMMUNITY)

## 2023-11-23 ENCOUNTER — Encounter (HOSPITAL_COMMUNITY): Payer: Self-pay

## 2023-11-23 DIAGNOSIS — R262 Difficulty in walking, not elsewhere classified: Secondary | ICD-10-CM

## 2023-11-23 DIAGNOSIS — Z9181 History of falling: Secondary | ICD-10-CM

## 2023-11-23 DIAGNOSIS — Z7409 Other reduced mobility: Secondary | ICD-10-CM

## 2023-11-23 NOTE — Therapy (Signed)
 OUTPATIENT PHYSICAL THERAPY NEURO TREATMENT   Patient Name: Jonathan Gilmore MRN: [id number] DOB:08-15-1950, 73 y.o., male Today's Date: 11/23/2023     END OF SESSION:  PT End of Session - 11/23/23 0719     Visit Number 3    Number of Visits 12    Date for PT Re-Evaluation 12/27/23    Authorization Type [employer] MEDICARE    Authorization Time Period uhc approved 12 visits from 11/15/23-12/27/23 [phone]    Authorization - Visit Number 3    Authorization - Number of Visits 12    Progress Note Due on Visit 10    PT Start Time 0719    PT Stop Time 0757    PT Time Calculation (min) 38 min    Equipment Utilized During Treatment Gait belt    Activity Tolerance Patient tolerated treatment well    Behavior During Therapy WFL for tasks assessed/performed           Past Medical History:  Diagnosis Date   Abnormal cardiovascular stress test 03/2013   Anxiety    Arthritis    Back pain    Cancer (HCC)    Prostate cancer-no treatment per pt   CHF (congestive heart failure) (HCC)    Coronary artery disease    Multivessel with significant LAD involvement by chest CT 2014   Depression    Diastolic dysfunction    Essential hypertension    Lumbar herniated disc    L4-L5   MRSA (methicillin resistant staph aureus) culture positive    Myocardial infarction (HCC)    Neuropathy    Shortness of breath dyspnea    Sleep apnea    No cpap   Type 2 diabetes mellitus (HCC)    Past Surgical History:  Procedure Laterality Date   ANTERIOR CERVICAL DECOMP/DISCECTOMY FUSION  01/18/2011   Procedure: ANTERIOR CERVICAL DECOMPRESSION/DISCECTOMY FUSION 2 LEVELS;  Surgeon: Victory LABOR Pool;  Location: [facility];  Service: Neurosurgery;  Laterality: N/A;  anterior cervical discectomy and fusion with allograft and plating, cervical five-six, cervical six-seven   BIOPSY N/A 04/04/2015   Procedure: BIOPSY;  Surgeon: Claudis RAYMOND Rivet, MD;  Location: [facility];  Service: Endoscopy;  Laterality: N/A;    BIOPSY  11/09/2019   Procedure: BIOPSY;  Surgeon: Eartha Angelia Sieving, MD;  Location: [facility];  Service: Gastroenterology;;   Bone spur removed  1997   Left shoulder   CARDIAC CATHETERIZATION N/A 08/08/2015   Procedure: Right/Left Heart Cath and Coronary Angiography;  Surgeon: Ozell Fell, MD;  Location: [facility]AS[facility];  Service: Cardiovascular;  Laterality: N/A;   CARPAL TUNNEL RELEASE Right    COLONOSCOPY WITH PROPOFOL  N/A 11/09/2019   Procedure: COLONOSCOPY WITH PROPOFOL ;  Surgeon: Eartha Angelia Sieving, MD;  Location: [facility];  Service: Gastroenterology;  Laterality: N/A;  900   COLONOSCOPY WITH PROPOFOL  N/A 02/27/2021   Procedure: COLONOSCOPY WITH PROPOFOL ;  Surgeon: Eartha Angelia Sieving, MD;  Location: [facility];  Service: Gastroenterology;  Laterality: N/A;  7:30   COLONOSCOPY WITH PROPOFOL  N/A 04/05/2022   Procedure: COLONOSCOPY WITH PROPOFOL ;  Surgeon: Shila Gustav GAILS, MD;  Location: [facility];  Service: Gastroenterology;  Laterality: N/A;   ENTEROSCOPY N/A 06/30/2022   Procedure: ENTEROSCOPY;  Surgeon: Eartha Angelia Sieving, MD;  Location: [facility];  Service: Gastroenterology;  Laterality: N/A;  2:00pm;ASA 3   ESOPHAGEAL DILATION N/A 04/04/2015   Procedure: ESOPHAGEAL DILATION;  Surgeon: Claudis RAYMOND Rivet, MD;  Location: [facility];  Service: Endoscopy;  Laterality:  N/A;   ESOPHAGEAL DILATION N/A 11/09/2019   Procedure: ESOPHAGEAL DILATION;  Surgeon: Eartha Angelia Sieving, MD;  Location: [facility];  Service: Gastroenterology;  Laterality: N/A;   ESOPHAGOGASTRODUODENOSCOPY N/A 04/04/2015   Procedure: ESOPHAGOGASTRODUODENOSCOPY (EGD);  Surgeon: Claudis RAYMOND Rivet, MD;  Location: [facility];  Service: Endoscopy;  Laterality: N/A;  1240   ESOPHAGOGASTRODUODENOSCOPY (EGD) WITH PROPOFOL  N/A 11/09/2019   Procedure: ESOPHAGOGASTRODUODENOSCOPY (EGD) WITH PROPOFOL ;  Surgeon: Eartha Angelia Sieving, MD;  Location: [facility];  Service:  Gastroenterology;  Laterality: N/A;   ESOPHAGOGASTRODUODENOSCOPY (EGD) WITH PROPOFOL  N/A 04/04/2022   Procedure: ESOPHAGOGASTRODUODENOSCOPY (EGD) WITH PROPOFOL ;  Surgeon: Leigh Elspeth SQUIBB, MD;  Location: [facility];  Service: Gastroenterology;  Laterality: N/A;   ESOPHAGOGASTRODUODENOSCOPY (EGD) WITH PROPOFOL   06/30/2022   Procedure: ESOPHAGOGASTRODUODENOSCOPY (EGD) WITH PROPOFOL ;  Surgeon: Eartha Angelia Sieving, MD;  [facility]: [facility];  Service: Gastroenterology;;   GIVENS CAPSULE STUDY N/A 05/11/2022   Procedure: GIVENS CAPSULE STUDY;  Surgeon: Eartha Angelia Sieving, MD;  Location: [facility];  Service: Gastroenterology;  Laterality: N/A;  7:30 am   HEMORRHOID SURGERY     HOT HEMOSTASIS  06/30/2022   Procedure: HOT HEMOSTASIS (ARGON PLASMA COAGULATION/BICAP);  Surgeon: Eartha Angelia, Sieving, MD;  Location: [facility];  Service: Gastroenterology;;   POLYPECTOMY  11/09/2019   Procedure: POLYPECTOMY;  Surgeon: Eartha Angelia Sieving, MD;  [facility]: [facility];  Service: Gastroenterology;;   POLYPECTOMY  02/27/2021   Procedure: POLYPECTOMY;  Surgeon: Eartha Angelia Sieving, MD;  Location: [facility];  Service: Gastroenterology;;   POLYPECTOMY  04/05/2022   Procedure: POLYPECTOMY;  Surgeon: Shila Gustav GAILS, MD;  Location: [facility] ENDOSCOPY;  Service: Gastroenterology;;   TONSILLECTOMY     Patient Active Problem List   Diagnosis Date Noted   Acute CVA (cerebrovascular accident) (HCC) 10/28/2023   Paroxysmal atrial fibrillation (HCC) 10/28/2023   Hypokalemia 10/28/2023   Stroke (cerebrum) (HCC) 10/27/2023   Stroke (HCC) 10/27/2023   GERD (gastroesophageal reflux disease) 04/27/2023   Iron deficiency 04/08/2023   Belching 02/08/2023   Hx of fusion of cervical spine 11/22/2022   AVM (arteriovenous malformation) of small bowel, acquired 06/30/2022   Iron deficiency anemia due to chronic blood loss 04/22/2022   History of esophagitis 04/22/2022   Heme  positive stool 04/04/2022   Iron deficiency anemia 04/04/2022   Acute blood loss anemia 04/02/2022   Prostate cancer (HCC) 06/08/2021   History of colonic polyps 02/09/2021   Intestinal metaplasia of antrum of stomach without dysplasia 02/09/2021   Insomnia 03/12/2019   Pure hypercholesterolemia 11/29/2018   Hypogonadism, male 11/23/2018   Essential hypertension, benign 11/13/2018   High risk medication use 10/28/2018   Carotid stenosis, asymptomatic, bilateral 10/28/2018   Tobacco use 10/28/2018   Polyp of colon 10/28/2018   Lumbar disc herniation with radiculopathy 12/21/2017   Spinal stenosis of lumbar region without neurogenic claudication 12/21/2017   CAD -50% LAD  08/09/2015   Moderate aortic stenosis 08/09/2015   Acute pulmonary edema (HCC) 08/07/2015   Acute diastolic CHF (congestive heart failure) (HCC) 08/07/2015   Hypersomnia with sleep apnea 09/20/2013   Syncope 09/19/2013   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   OSA (obstructive sleep apnea) -non compliant 09/19/2013   Narcolepsy and cataplexy 09/19/2013   Facial abscess 04/28/2013   Facial cellulitis 04/28/2013   Mixed hyperlipidemia 04/28/2013   Tobacco dependence 04/28/2013   Sepsis (HCC) 04/28/2013   Gout 04/28/2013   Chronic diastolic CHF (congestive heart failure) (HCC) 04/28/2013   Pleuritic pain 02/24/2013  Chest pain 02/24/2013   Murmur 02/24/2013   Pericarditis 02/24/2013   Type 2 diabetes mellitus with hyperlipidemia (HCC) 02/24/2013   Essential hypertension 02/24/2013   Cervical spondylosis with myelopathy 01/19/2011   PCP: Levora Reyes SAUNDERS, MD  REFERRING PROVIDER: Noralee Elidia Sieving, MD ONSET DATE: 11/03/23  REFERRING DIAG: I63.9 (ICD-10-CM) - Acute CVA (cerebrovascular accident) (HCC)  THERAPY DIAG:  Difficulty in walking, not elsewhere classified  Impaired functional mobility, balance, and endurance  History of falling  Rationale for Evaluation and Treatment:  Rehabilitation  SUBJECTIVE:                                                                                                                                                                                             SUBJECTIVE STATEMENT: Reports he walked all yesterday without cane, no reports of LOB.  Continues to have some pain in shoulders and back, pain scale 6/10.  (Initial) Pt states he lost balance when stroke occurred, was not using cane before the stroke happened. Pt states he was outiside weedeating when the stroke occurred and fell several times, was crawling around due to imbalance/dizziness. Pt denies any weakness or numbness related to the stroke, does have neuropahty due to diabetes. Pts wife states pt was leaning to the right side and right eye was drooping. Pt accompanied by: self and significant other  PERTINENT HISTORY:  Diabetes, neuropathy CHF Aortic Valve issue  PAIN:  Are you having pain? Yes: NPRS scale: 7/10 Pain location: bilateral shoulder Pain description: aches, sharp pain with movement Aggravating factors: sleeping, moving Relieving factors: laying on back  PRECAUTIONS: Fall  RED FLAGS: None   WEIGHT BEARING RESTRICTIONS: No  FALLS: Has patient fallen in last 6 months? Yes. Number of falls 6-8  LIVING ENVIRONMENT: Lives with: lives with their spouse Lives in: House/apartment Stairs: Yes: External: 2 steps; on right going up Has following equipment at home: Single point cane and Walker - 2 wheeled  PLOF: Independent and Independent with basic ADLs  PATIENT GOALS: improve balance and walking with LRAD  OBJECTIVE:  Note: Objective measures were completed at Evaluation unless otherwise noted.  DIAGNOSTIC FINDINGS:  EXAM: MRI BRAIN WITHOUT CONTRAST 10/27/2023 04:34:27 PM   TECHNIQUE: Multiplanar multisequence MRI of the head/brain was performed without the administration of intravenous contrast.   COMPARISON: MRI of the brain dated  09/10/2000.   CLINICAL HISTORY: Syncope/presyncope, cerebrovascular cause suspected. Syncope; Limited positioning, pt combative.   FINDINGS:   BRAIN AND VENTRICLES: There is a new area of restricted diffusion present medially within the right cerebellar hemisphere. There is mild cerebellar swelling with partial effacement of the fourth  ventricle. There is mild periventricular and deep cerebral white matter disease present.   ORBITS: No acute abnormality.   SINUSES AND MASTOIDS: Polypoidal mucosal disease present within the floors of the maxillary sinuses.   BONES AND SOFT TISSUES: Normal marrow signal. No acute soft tissue abnormality.   IMPRESSION: 1. New area of restricted diffusion medially within the right cerebellar hemisphere with mild cerebellar swelling and partial effacement of the fourth ventricle, consistent with Nonhemorrhagic acute infarct. 2. Mild periventricular and deep cerebral white matter disease.   Electronically signed by: evalene coho 10/27/2023 05:20 PM EDT RP Workstation: [id number]  COGNITION: Overall cognitive status: Pts significant other reports changes in cognition although pt denies any change   SENSATION: Light touch: Impaired  Toes  COORDINATION: Pt demonstrates abnormal finger to nose testing with LUE  DTRs:  Not assessed this date   LOWER EXTREMITY ROM:     Active  Right Eval Left Eval  Hip flexion    Hip extension    Hip abduction    Hip adduction    Hip internal rotation    Hip external rotation    Knee flexion    Knee extension    Ankle dorsiflexion    Ankle plantarflexion    Ankle inversion    Ankle eversion     (Blank rows = not tested)  LOWER EXTREMITY MMT:    MMT Right Eval Left Eval  Hip flexion 4 4  Hip extension    Hip abduction 4 4  Hip adduction 4 4  Hip internal rotation    Hip external rotation    Knee flexion 4 4  Knee extension 4 4  Ankle dorsiflexion 3+ 3+  Ankle plantarflexion     Ankle inversion    Ankle eversion    (Blank rows = not tested)    STAIRS: Findings: Stair Negotiation Technique: Alternating Pattern  with Single Rail on Left, Number of Stairs: 4, Height of Stairs: 8 inches   , and Comments: pt demonstrates good LE strength with alternating pattern but increased time required GAIT: Findings: Gait Characteristics: decreased stride length, wide BOS, and poor foot clearance- Right, Distance walked: 270 feet, Assistive device utilized:cane held in hand but did not use it and None, and Comments: pt demonstrates consistent early heel strike on RLE and occassional early heel strike on LLE. Pt states it is because of the shoewear, will reassess next visit with pt instructed to wear sneakers next session.   FUNCTIONAL TESTS:  5 times sit to stand: 11.53 seconds, minimal UE support 2 minute walk test: 270 ft SLS 11/15/23: R: 1.5 seconds L: 0.96 seconds  PATIENT SURVEYS:  ABC scale: 930/1600 - 58.1%                                                                                                                              TREATMENT DATE:  11/23/23: Nustep UE/LE average 77 spm x Gait training without AD x 400  min cueing to improve heel strike Bed mobility, log rolling working on rolling, limited by shoulder pain Sidelying:  -Abduction 10x 3 Supine:  -Bridge 25x 3  -floating SLR 10x  -LTR 5x 10 Standing:  STS standard height 10x  Hurdles lateral sidestep in //bars 3RT 6in then 12in intermittent HHA reduced with reps.   11/18/23 Transfer training Sit to stand w/ arms crossed  Balance gait training integration Step up and over hurdle to airex with alternating SL hip flexion for improving functional foot clearance and SLS engagement Required UE A for maintaining balance/stability Side step over hurdle with cues for maintaining upright and for improving foot clearance and weight shift balance integration Required UE A and CGA for maintaining  balance Ball roll out (20 x each) Improving lumbopelvic mobility and to mitigate pain Review of goals/education assessment with ABC scale Gait training 2 laps around clinic with cues for heel strike and foot clearance - maintained clearance and heel strike 30% of the time with L LE landing midfoot and ER > R LE landing heel strike  Verbal cues for stepping through mud as well as stepping over objects Heel / toe raises  10 each with education for performance at home in order to improve functional foot clearance during gait to reduce fall risk   11/15/2023  Evaluation: -ROM measured, Strength assessed, HEP prescribed, pt educated on prognosis, findings, and importance of HEP compliance if given.   PATIENT EDUCATION: Education details: Pt was educated on findings of PT evaluation, prognosis, frequency of therapy visits and rationale, attendance policy, and HEP if given.   Person educated: Patient and Spouse Education method: Explanation, Verbal cues, and Handouts Education comprehension: verbalized understanding, verbal cues required, and needs further education  HOME EXERCISE PROGRAM: Access Code: [email or web address] URL: [email or web address] Date: 11/15/2023 Prepared by: Lang Ada  Exercises - Standing Single Leg Stance with Counter Support  - 1 x daily - 7 x weekly - 1 sets - 3 reps - 30 hold - Sit to Stand with Arms Crossed  - 1 x daily - 7 x weekly - 3 sets - 10 reps - Supine Bridge  - 1 x daily - 7 x weekly - 3 sets - 10 reps - 5 hold  Access Code: [email or web address] URL: [email or web address] Date: 11/18/2023 Prepared by: Lamarr Citrin  Exercises - Heel Toe Raises with Counter Support  - 1 x daily - 7 x weekly - 3 sets - 10 reps  Access Code: [email or web address] URL: [email or web address] Date: 11/23/2023 Prepared by: Augustin Mclean  Exercises - Standing Single Leg Stance with Counter Support  - 1 x daily - 7 x weekly - 1 sets - 3 reps - 30 hold - Sit  to Stand with Arms Crossed  - 1 x daily - 7 x weekly - 3 sets - 10 reps - Supine Bridge  - 1 x daily - 7 x weekly - 3 sets - 10 reps - 5 hold - Heel Toe Raises with Counter Support  - 1 x daily - 7 x weekly - 3 sets - 10 reps - Sidelying Hip Abduction  - 2 x daily - 7 x weekly - 3 sets - 10 reps - 5 hold  GOALS: Goals reviewed with patient? Yes  SHORT TERM GOALS: Target date: 12/06/23  Patient will demonstrate evidence of independence with individualized HEP and will report compliance for at least 3 days per week for optimized progression towards remaining therapy goals. Baseline:  Goal status: INITIAL  2.  Patient will report  no falls for the next three weeks for demonstration of increased safety and community awareness for decreased risk of injury. Baseline: pt has fallen 6-8 times in the last 6 months Goal status: INITIAL     LONG TERM GOALS: Target date: 12/27/23  Pt will demonstrate a an increase of at least 15 percent on the ABC scale for improved confidence of balance during community ambulation and ADL. Baseline: see objective Goal status: INITIAL  2.  Pt will improve 2 MWT by 40 feet in order to demonstrate improved functional ambulatory capacity in community setting.  Baseline: see objective Goal status: INITIAL  3.  Pt will demonstrate increased MMT grade in RLE to at least a 4+/5, for increased strength during mobility and maximal efficiency of gait cycle during ambulation. Baseline: see objective Goal status: INITIAL  4.  Pt will demonstrate the ability to get up from the floor in supine position to standing with modified independence and self capable strategies. Baseline: pt does not feel like he would be able to complete task at eval Goal status: INITIAL  5.  Pt will improve 5TSTS by 2.3 seconds in order to improve strength during functional activities. Baseline: see objective Goal status: INITIAL  6. Pt will demonstrate ability to improve SLS to 10 seconds  bilaterally for improved balance and decreased fall risk. Baseline: see objective Goal status: INITIAL  ASSESSMENT:  CLINICAL IMPRESSION:  Began session on Nustep for dynamic warmup and sequening UE/LE.  Gait training complete without AD, no LOB with occasional Trendelenburg mechanics that was able to self correct following cueing, improved heel to toe mechanics with min cueing.  Pt limited by neuropathy pain with weight bearing today, mat activities complete for LE strengthening.  Instructed bed mobility that was most limited by shoulder pain that was pre-excitation prior CVA.  Added gluteal strengthening exercises with good form following initial instructions, added abduction to HEP.  Balance activities to improve SLS with gait mechanics, increased ease with 6in and progressed to 12in with hurdles.  (Eval) Patient is a 73 y.o. male who was seen today for physical therapy evaluation and treatment for I63.9 (ICD-10-CM) - Acute CVA (cerebrovascular accident) (HCC).   Patient demonstrates impaired coordination on LUE, decreased LE strength, abnormal pain rating in shoulders, abnormal gait pattern, and impaired balance. Patient also demonstrates difficulty with ambulation during today's session with decreased stride length, consistent early heel strike on RLE, and decreased velocity noted although able to complete with no AD this date. Patient also demonstrates ability to navigate stairs with reciprocal pattern with use of one UE on railing ascending and descending. Patient requires education on role of PT, importance of frequent physical activity and importance of HEP compliance. Patient would benefit from skilled physical therapy for increased endurance/efficiency with ambulation, increased LE strength, and balance for improved gait quality, return to higher level of function with ADLs, and progress towards therapy goals.   OBJECTIVE IMPAIRMENTS: Abnormal gait, decreased balance, decreased  coordination, decreased endurance, decreased mobility, difficulty walking, decreased strength, impaired sensation, and pain.   ACTIVITY LIMITATIONS: carrying, lifting, bending, squatting, sleeping, transfers, and bed mobility  PARTICIPATION LIMITATIONS: meal prep, cleaning, laundry, driving, shopping, community activity, and yard work  PERSONAL FACTORS: Age, Past/current experiences, Time since onset of injury/illness/exacerbation, and 1-2 comorbidities: heart issues, diabetes are also affecting patient's functional outcome.   REHAB POTENTIAL: Fair neurological in nature  CLINICAL DECISION MAKING: Stable/uncomplicated  EVALUATION COMPLEXITY: Low  PLAN:  PT FREQUENCY: 1-2x/week  PT DURATION: 6 weeks  PLANNED INTERVENTIONS: 97110-Therapeutic exercises, 97530- Therapeutic activity, [id number]- Neuromuscular re-education, [phone]- Self Care, [id number]- Manual therapy, [phone]- Gait training, Patient/Family education, Balance training, Stair training, and DME instructions  PLAN FOR NEXT SESSION: progress functional mobility (floor to standing transfer), progress gait and balance training, progress LE strengthening especially RLE  Augustin Mclean, LPTA/CLT; CBIS [phone]  4:34 PM, 11/23/23

## 2023-11-24 ENCOUNTER — Other Ambulatory Visit: Payer: Self-pay | Admitting: *Deleted

## 2023-11-24 NOTE — Patient Instructions (Signed)
 Visit Information  Thank you for taking time to visit with me today. Please don't hesitate to contact me if I can be of assistance to you before our next scheduled telephone appointment.  Our next appointment is by telephone on 11/30/23 @ 11 am  Following is a copy of your care plan:   Goals Addressed             This Visit's Progress    VBCI Transitions of Care (TOC) Care Plan       Problems:  Recent Hospitalization for treatment of CVA Recent hospital admission for CVA Has planned transfer of care from PCP to new PCP:  moving from [facility] to [facility]: has appointment to establish care with new PCP on 11/25/23 11/16/23: confirmed patient plans to attend new PCP appointment at [facility] as scheduled 11/25/23 Hospital admission August 14-17, 2025 for acute CVA without deficit Independent at baseline; supportive assisting as indicated post- recent hospital discharge (1) unplanned hospital admission x last (6) and (12) months 11/24/23- pt reports he is doing well, no new concerns reported, continues outpatient rehab, saw cardiologist on 11/21/23  Goal:  Over the next 30 days, the patient will not experience hospital readmission  Interventions:  Transitions of Care: week # 4 Durable Medical Equipment (DME) needs assessed with patient/caregiver Doctor Visits  - discussed the importance of doctor visits Post discharge activity limitations prescribed by provider reviewed Confirmed still not currently requiring/ using assistive devices for ambulation - has walker/ cane/ walking stick, but reports doesn't need much anymore confirmed no new/ recent falls; provided education/ reinforcement around fall prevention/ need to keep assistive devices nearby for prn use Reinforced importance of consistently obtaining daily weights/ blood pressures at home: provided reinforcement of education / rationale for daily weight monitoring at home as earliest indicator of fluid  retention, along with weight gain guidelines/ action plan for weight gain; importance of taking diuretic as prescribed Reviewed HF action plan, importance of calling provider early on for change in health status/ symptoms Reviewed fluid restrictions < 2 L ~ per cardiology instruction Reinforced importance of monitoring/ recording blood pressures at home- reviewed AVS from recent cardiology visit- check BP at home at least 2 hours after meals and sitting for 5-10 minutes (pt has not checked BP over this past week but plans to start checking again consistently) Confirmed pt continues taking Lasix  QD prn for edema, weight gain and dyspnea Reviewed all upcoming scheduled appointments including cardiologist 12/27/23  Stroke: Reviewed Importance of taking all medications as prescribed Reviewed Importance of attending all scheduled provider appointments Advised to report any changes in symptoms or exercise tolerance Assessed for signs and symptoms of stroke Assessed for cognitive impairment Assessed use of tobacco use Provided reinforcement of previously provided education around signs/ symptoms CVA (BEFAST) along with corresponding action plan: patient verbalizes ongoing better understanding of same and will benefit from ongoing reinforcement around same; patient denies all symptoms today Reinforced importance of attending outpatient PT/ rehabilitation sessions Reviewed importance of smoking cessation with pt, not interested at present, pt smokes few cigarettes per day and vapes  Patient Self Care Activities:  Attend all scheduled provider appointments Call provider office for new concerns or questions  Participate in Transition of Care Program/Attend TOC scheduled calls Take medications as prescribed   Continue pacing activity as your recuperation from recent surgery continues Weigh yourself every day to stay on top of early fluid retention: write down your weights every day so you remember  what  it is from day to day: follow the weight-gain guidelines and action plan to call your doctor if you gain more than 3 lbs overnight, or 5 lbs in one week Continue monitoring and recording your blood pressures, daily weights, and blood sugars at home Use assistive devices as needed to prevent falls- your walking stick and/ or your new walker Please go over all of your medications with your care providers /doctors Eat a heart healthy and low salt diet If you believe your condition is getting worse- contact your care providers (doctors) promptly- reaching out to your doctor early when you have concerns can prevent you from having to go to the hospital Seek emergency care for any stroke symptoms You have fluid restrictions of no more than 2 liters per day  Plan:  Telephone follow up appointment with care management team member scheduled for:  11/30/23 @ 11 am        Patient verbalizes understanding of instructions and care plan provided today and agrees to view in MyChart. Active MyChart status and patient understanding of how to access instructions and care plan via MyChart confirmed with patient.     Telephone follow up appointment with care management team member scheduled for:  11/30/23 @ 11 am  Please call the care guide team at [phone] if you need to cancel or reschedule your appointment.   Please call the Suicide and Crisis Lifeline: 988 call the [place]  National Suicide Prevention Lifeline: 7042283463 or TTY: 9712949985 TTY (854) 437-6803) to talk to a trained counselor call 1-800-273-TALK (toll free, 24 hour hotline) go to [facility] [address] [place] [phone]) call the [facility] Crisis Line: 7635947540 call 911 if you are experiencing a Mental Health or Behavioral Health Crisis or need someone to talk to.  Mliss Creed [facility], BSN RN Care Manager/ Transition of Care [place]/ [facility] [phone]

## 2023-11-24 NOTE — Patient Outreach (Signed)
 Transition of Care week 4  Visit Note  11/24/2023  Name: Jonathan Gilmore MRN: [id number]          DOB: 06/19/1950  Situation: Patient enrolled in [facility] 30-day program. Visit completed with patient by telephone.   Background: Pt reports overall doing well, no new concerns reported today, continues outpatient rehab, continues weighing daily, has not checked BP over the past several days but plans to start back consistently, saw cardiologist on 11/21/23.    Past Medical History:  Diagnosis Date   Abnormal cardiovascular stress test 03/2013   Anxiety    Arthritis    Back pain    Cancer (HCC)    Prostate cancer-no treatment per pt   CHF (congestive heart failure) (HCC)    Coronary artery disease    Multivessel with significant LAD involvement by chest CT 2014   Depression    Diastolic dysfunction    Essential hypertension    Lumbar herniated disc    L4-L5   MRSA (methicillin resistant staph aureus) culture positive    Myocardial infarction (HCC)    Neuropathy    Shortness of breath dyspnea    Sleep apnea    No cpap   Type 2 diabetes mellitus (HCC)     Assessment: Patient Reported Symptoms: Cognitive Cognitive Status: No symptoms reported, Alert and oriented to person, place, and time, Normal speech and language skills, Able to follow simple commands      Neurological Neurological Review of Symptoms: No symptoms reported Oher Neurological Symptoms/Conditions [RPT]: Recent CVA- reviewed signs/ symptoms TIA/ CVA, reportable signs/ symptoms and seeking emergency care Neurological Management Strategies: Routine screening, Adequate rest Neurological Self-Management Outcome: 4 (good)  HEENT HEENT Symptoms Reported: No symptoms reported      Cardiovascular Cardiovascular Symptoms Reported: No symptoms reported Other Cardiovascular Symptoms: HF- reviewed HF action plan, importance of continuing daily weights, reviewed <2 L fluid restriction            HTN- reviewed instructions from  AVS from cardiology visit on 11/21/23- check BP at home at least 2 hours after taking medication and sitting still for 5-10 minutes, pt states he has not checked BP over the past week, will try to start back checking consistenly, verbalizes understanding of instructions Does patient have uncontrolled Hypertension?: No Cardiovascular Management Strategies: Adequate rest, Medication therapy, Routine screening, Fluid modification Do You Have a Working Readable Scale?: Yes Weight: 174 lb (78.9 kg) Cardiovascular Self-Management Outcome: 4 (good) Cardiovascular Comment: pt not interested in smoking cessation at present, states   I smoke very little, I mostly vape  Respiratory Respiratory Symptoms Reported: No symptoms reported    Endocrine Endocrine Symptoms Reported: No symptoms reported Is patient diabetic?: Yes Is patient checking blood sugars at home?: Yes List most recent blood sugar readings, include date and time of day: pt reports RBS 147 on 11/23/23, has not checked today,  reinforced carbohydrate modified diet Endocrine Self-Management Outcome: 4 (good)  Gastrointestinal Gastrointestinal Symptoms Reported: No symptoms reported      Genitourinary Genitourinary Symptoms Reported: No symptoms reported    Integumentary Integumentary Symptoms Reported: No symptoms reported    Musculoskeletal Musculoskelatal Symptoms Reviewed: No symptoms reported Additional Musculoskeletal Details: pt continues attending outpatient rehab, states this is going well Musculoskeletal Management Strategies: Routine screening, Exercise      Psychosocial Psychosocial Symptoms Reported: No symptoms reported         There were no vitals filed for this visit.  Medications Reviewed Today     Reviewed by  Aura Mliss LABOR, RN (Registered Nurse) on 11/24/23 at 1112  Med List Status: <None>   Medication Order Taking? Sig Documenting Provider Last Dose Status Informant  ACCU-CHEK AVIVA PLUS test strip [id number]   USE AS INSTRUCTED Corum, Olam CROME, MD  Active Spouse/Significant Other, Pharmacy Records  apixaban  (ELIQUIS ) 5 MG TABS tablet [id number]  Take 1 tablet (5 mg total) by mouth 2 (two) times daily. Levora Reyes SAUNDERS, MD  Active   atorvastatin  (LIPITOR ) 80 MG tablet [id number]  Take 1 tablet (80 mg total) by mouth daily. Alvan Dorn FALCON, MD  Active Spouse/Significant Other, Pharmacy Records  Calcium -Magnesium -Zinc (CAL-MAG-ZINC PO) [id number]  Take 1 tablet by mouth daily. [provider]  Active Spouse/Significant Other, Pharmacy Records  furosemide  (LASIX ) 40 MG tablet [id number]  Take 1 tablet (40 mg total) by mouth as needed for edema (swelling).  Patient taking differently: Take 40 mg by mouth as needed for edema (swelling). 10/31/23: Reports during TOC call, has been taking every day   Miriam Norris, NP  Active Spouse/Significant Other, Pharmacy Records  gabapentin  (NEURONTIN ) 300 MG capsule [id number]  TAKE 1 TO 2 CAPSULES BY MOUTH  TWICE DAILY Levora Reyes SAUNDERS, MD  Active Spouse/Significant Other, Pharmacy Records  lisinopril  (ZESTRIL ) 20 MG tablet [id number]  Take 1 tablet (20 mg total) by mouth daily. Levora Reyes SAUNDERS, MD  Active   metFORMIN  (GLUCOPHAGE ) 500 MG tablet [id number]  Take 2 tablets (1,000 mg total) by mouth 2 (two) times daily with a meal. Levora Reyes SAUNDERS, MD  Active   metoprolol  succinate (TOPROL -XL) 50 MG 24 hr tablet [id number]  TAKE 1 TABLET BY MOUTH DAILY  Patient taking differently: Take 50 mg by mouth every evening.   Alvan Dorn FALCON, MD  Active Spouse/Significant Other, Pharmacy Records  Multiple Vitamin (MULTIVITAMIN WITH MINERALS) TABS [id number]  Take 1 tablet by mouth daily. [provider]  Active Spouse/Significant Other, Pharmacy Records  nicotine  (NICODERM CQ  - DOSED IN MG/24 HOURS) 21 mg/24hr patch [id number]  Place 1 patch (21 mg total) onto the skin daily. Arrien, Elidia Sieving, MD  Active   nitroGLYCERIN  (NITROSTAT ) 0.4 MG SL tablet [id number]   Place 1 tablet (0.4 mg total) under the tongue every 5 (five) minutes x 3 doses as needed for chest pain (if no relief after 2nd dose, proceed to ED for an evaluation). Alvan Dorn FALCON, MD  Active Spouse/Significant Other, Pharmacy Records  Omega-3 Fatty Acids (OMEGA 3 FISH OIL PO) [id number]  Take 1 capsule by mouth daily. [provider]  Active   pantoprazole  (PROTONIX ) 40 MG tablet [id number]  Take 1 tablet (40 mg total) by mouth 2 (two) times daily. Mariette Mitzie CROME, NP  Active Spouse/Significant Other, Pharmacy Records            Goals Addressed             This Visit's Progress    VBCI Transitions of Care (TOC) Care Plan       Problems:  Recent Hospitalization for treatment of CVA Recent hospital admission for CVA Has planned transfer of care from PCP to new PCP:  moving from [facility] to [facility]: has appointment to establish care with new PCP on 11/25/23 11/16/23: confirmed patient plans to attend new PCP appointment at [facility] as scheduled 11/25/23 Hospital admission August 14-17, 2025 for acute CVA without deficit Independent at baseline; supportive assisting as indicated post- recent hospital discharge (1) unplanned hospital admission x last (6) and (12) months 11/24/23- pt reports  he is doing well, no new concerns reported, continues outpatient rehab, saw cardiologist on 11/21/23  Goal:  Over the next 30 days, the patient will not experience hospital readmission  Interventions:  Transitions of Care: week # 4 Durable Medical Equipment (DME) needs assessed with patient/caregiver Doctor Visits  - discussed the importance of doctor visits Post discharge activity limitations prescribed by provider reviewed Confirmed still not currently requiring/ using assistive devices for ambulation - has walker/ cane/ walking stick, but reports doesn't need much anymore confirmed no new/ recent falls; provided education/ reinforcement around  fall prevention/ need to keep assistive devices nearby for prn use Reinforced importance of consistently obtaining daily weights/ blood pressures at home: provided reinforcement of education / rationale for daily weight monitoring at home as earliest indicator of fluid retention, along with weight gain guidelines/ action plan for weight gain; importance of taking diuretic as prescribed Reviewed HF action plan, importance of calling provider early on for change in health status/ symptoms Reviewed fluid restrictions < 2 L ~ per cardiology instruction Reinforced importance of monitoring/ recording blood pressures at home- reviewed AVS from recent cardiology visit- check BP at home at least 2 hours after meals and sitting for 5-10 minutes (pt has not checked BP over this past week but plans to start checking again consistently) Confirmed pt continues taking Lasix  QD prn for edema, weight gain and dyspnea Reviewed all upcoming scheduled appointments including cardiologist 12/27/23  Stroke: Reviewed Importance of taking all medications as prescribed Reviewed Importance of attending all scheduled provider appointments Advised to report any changes in symptoms or exercise tolerance Assessed for signs and symptoms of stroke Assessed for cognitive impairment Assessed use of tobacco use Provided reinforcement of previously provided education around signs/ symptoms CVA (BEFAST) along with corresponding action plan: patient verbalizes ongoing better understanding of same and will benefit from ongoing reinforcement around same; patient denies all symptoms today Reinforced importance of attending outpatient PT/ rehabilitation sessions Reviewed importance of smoking cessation with pt, not interested at present, pt smokes few cigarettes per day and vapes  Patient Self Care Activities:  Attend all scheduled provider appointments Call provider office for new concerns or questions  Participate in Transition of  Care Program/Attend TOC scheduled calls Take medications as prescribed   Continue pacing activity as your recuperation from recent surgery continues Weigh yourself every day to stay on top of early fluid retention: write down your weights every day so you remember what it is from day to day: follow the weight-gain guidelines and action plan to call your doctor if you gain more than 3 lbs overnight, or 5 lbs in one week Continue monitoring and recording your blood pressures, daily weights, and blood sugars at home Use assistive devices as needed to prevent falls- your walking stick and/ or your new walker Please go over all of your medications with your care providers /doctors Eat a heart healthy and low salt diet If you believe your condition is getting worse- contact your care providers (doctors) promptly- reaching out to your doctor early when you have concerns can prevent you from having to go to the hospital Seek emergency care for any stroke symptoms You have fluid restrictions of no more than 2 liters per day  Plan:  Telephone follow up appointment with care management team member scheduled for:  11/30/23 @ 11 am        Recommendation:   PCP Follow-up Specialty provider follow-up cardiology 12/27/23  Follow Up Plan:   Telephone follow-up 11/30/23 @  11 am with Laine Tousey RN  Mliss Creed [facility], BSN RN Care Manager/ Transition of Care [place]/ [facility] [phone]

## 2023-11-25 ENCOUNTER — Encounter: Payer: Self-pay | Admitting: Family Medicine

## 2023-11-25 ENCOUNTER — Ambulatory Visit: Admitting: Family Medicine

## 2023-11-25 ENCOUNTER — Ambulatory Visit (HOSPITAL_COMMUNITY)

## 2023-11-25 ENCOUNTER — Encounter (HOSPITAL_COMMUNITY): Payer: Self-pay

## 2023-11-25 VITALS — BP 100/62 | HR 60 | Temp 98.1°F | Ht 64.0 in | Wt 176.1 lb

## 2023-11-25 DIAGNOSIS — I5032 Chronic diastolic (congestive) heart failure: Secondary | ICD-10-CM

## 2023-11-25 DIAGNOSIS — I10 Essential (primary) hypertension: Secondary | ICD-10-CM

## 2023-11-25 DIAGNOSIS — Z7689 Persons encountering health services in other specified circumstances: Secondary | ICD-10-CM

## 2023-11-25 DIAGNOSIS — Z9181 History of falling: Secondary | ICD-10-CM

## 2023-11-25 DIAGNOSIS — I6930 Unspecified sequelae of cerebral infarction: Secondary | ICD-10-CM

## 2023-11-25 DIAGNOSIS — I480 Paroxysmal atrial fibrillation: Secondary | ICD-10-CM

## 2023-11-25 DIAGNOSIS — R262 Difficulty in walking, not elsewhere classified: Secondary | ICD-10-CM

## 2023-11-25 DIAGNOSIS — Z7409 Other reduced mobility: Secondary | ICD-10-CM | POA: Diagnosis not present

## 2023-11-25 DIAGNOSIS — Z23 Encounter for immunization: Secondary | ICD-10-CM

## 2023-11-25 DIAGNOSIS — E785 Hyperlipidemia, unspecified: Secondary | ICD-10-CM

## 2023-11-25 DIAGNOSIS — Z720 Tobacco use: Secondary | ICD-10-CM

## 2023-11-25 DIAGNOSIS — E1169 Type 2 diabetes mellitus with other specified complication: Secondary | ICD-10-CM

## 2023-11-25 DIAGNOSIS — E7800 Pure hypercholesterolemia, unspecified: Secondary | ICD-10-CM | POA: Diagnosis not present

## 2023-11-25 DIAGNOSIS — I639 Cerebral infarction, unspecified: Secondary | ICD-10-CM

## 2023-11-25 MED ORDER — COVID-19 MRNA VACC (MODERNA) 50 MCG/0.5ML IM SUSY: 0.5000 mL | PREFILLED_SYRINGE | Freq: Once | INTRAMUSCULAR | 0 refills | Status: AC

## 2023-11-25 MED ORDER — VARENICLINE TARTRATE 1 MG PO TABS: 1.0000 mg | ORAL_TABLET | Freq: Two times a day (BID) | ORAL | 2 refills | Status: DC

## 2023-11-25 NOTE — Progress Notes (Signed)
 Patient Office Visit  Assessment & Plan:  Encounter to establish care  Need for COVID-19 vaccine -     COVID-19 mRNA Vacc (Moderna); Inject 0.5 mLs into the muscle once for 1 dose.  Dispense: 0.5 mL; Refill: 0  Acute CVA (cerebrovascular accident) (HCC)  Chronic diastolic CHF (congestive heart failure) (HCC)  Essential hypertension, benign  Pure hypercholesterolemia  Type 2 diabetes mellitus with hyperlipidemia (HCC)  Tobacco use  Paroxysmal atrial fibrillation (HCC)  Need for influenza vaccination -     Flu vaccine HIGH DOSE PF(Fluzone Trivalent)  Other orders -     Varenicline  Tartrate; Take 1 tablet (1 mg total) by mouth 2 (two) times daily. Set quit date  Dispense: 60 tablet; Refill: 2   Assessment and Plan    Recent ischemic stroke with residual balance impairment Ischemic stroke one month ago with balance impairment. Undergoing physical therapy with slow improvement. Residual symptoms include wooziness and sensation of being on a boat. Vertebral artery occlusion noted. - Continue physical therapy twice a week for six weeks. - Follow up with neurologist at [facility] on October 1st.  Patent foramen ovale and atrial septal defect Patent foramen ovale and atrial septal defect noted, increasing stroke risk. Awaiting cardiologist evaluation. - Follow up with Dr. Wonda at [facility] for further evaluation.  Paroxysmal atrial fibrillation Paroxysmal atrial fibrillation noted during EMS transport. On metoprolol  with no recent episodes. Eliquis  restarted. - Continue metoprolol . - Continue Eliquis  as prescribed.  Severe aortic stenosis Severe aortic stenosis confirmed by echocardiogram. No syncope reported. - Follow up with cardiologist Dr. Alvan for further evaluation and management.  Chronic diastolic heart failure Chronic diastolic heart failure with significant weight loss. No recent exacerbations. - Follow up with cardiologist as  scheduled.  Type 2 diabetes mellitus, controlled Type 2 diabetes mellitus with improved A1c from 8.4 to 7.1. Managed with metformin . Occasional diarrhea reported. - Continue metformin .  Pure hypercholesterolemia Hypercholesterolemia managed with Lipitor . No severe muscle aches. Last cholesterol check in August. - Continue Lipitor . - Recheck cholesterol levels as needed.  Essential hypertension Essential hypertension managed with current medication regimen.  Tobacco use disorder, long-term Long-term tobacco use disorder. Previous quit attempts with patches unsuccessful. Discussed Chantix  for smoking cessation. - Prescribe Chantix . - Set a quit date and start Chantix  two weeks prior. - Continue Chantix  for three months.  General Health Maintenance Discussed vaccinations including flu and shingles. Flu shot recommended due to high risk. - Administer flu shot. - Consider shingles vaccine in the future.     Test results were reviewed and analyzed as part of the medical decision making of this visit.  Reviewed previous notes from hospitalization, cardiology and previous lab work. Recommend tobacco cessation and patient will consider starting Chantix .  Follow-up in 3 months or sooner sooner if necessary.  High-dose flu shot given today.  Patient will get the COVID-vaccine at the pharmacy.   Return in about 3 months (around 02/24/2024), or if symptoms worsen or fail to improve.   Subjective:    Patient ID: Jonathan Gilmore, male    DOB: December 07, 1950  Age: 73 y.o. MRN: [id number]  Chief Complaint  Patient presents with   Medical Management of Chronic Issues   Establish Care    HPI Discussed the use of AI scribe software for clinical note transcription with the patient, who gave verbal consent to proceed.  History of Present Illness        History of Present Illness Jonathan Gilmore is a  73 year old male with a history of stroke who presents for follow-up regarding his stroke recovery  and ongoing symptoms, follow up chronic medical issues and establish care here  Approximately one month ago, he experienced a stroke that primarily affected his balance, with a sudden onset while he was weed eating, leading to a fall. His balance was severely impaired, requiring him to crawl. Since the stroke, he has been attending physical therapy twice a week, which he feels has resulted in about a 10% improvement. He continues to experience dizziness, describing it as feeling 'like being on a boat', and has had one fall at home shortly after being discharged from the hospital.  He has a history of atrial fibrillation and was initially placed on a blood thinner, Eliquis , which he discontinued but later restarted. He is also taking metoprolol . No recent episodes of heart racing.  He has type 2 diabetes, with blood sugar levels typically ranging from 140 to 160 mg/dL. He is currently taking metformin  and experiences occasional diarrhea but no other side effects. His A1c has improved from 8.4% to 7.1%.  He has experienced significant weight loss, having lost approximately 100 pounds over the past few years, with his weight stabilizing in the 170s for the past six to nine months. He attributes his weight loss to a decreased appetite, which predates his stroke. He eats less and often feels full after a few bites.  He has a history of aortic stenosis and a heart murmur. He is under the care of Dr. Alvan and has an upcoming appointment. He has been told he has a 'hole in his heart' and is awaiting further clarification from his cardiologist.  He has been smoking for 60 years, starting at age 32. He has attempted to quit using patches but was only successful for a few hours. He has a CT scan for lung cancer screening scheduled for September 22.  He has a history of internal hemorrhoids and diverticulosis, with a colonoscopy performed six to eight months ago revealing polyps. He has had episodes of bleeding  requiring blood transfusions in the past.  He experiences sleep disturbances, sleeping only a few hours at a time. He denies depression but feels tired. He has noticed changes in his personality and speech, which his wife attributes to missing teeth rather than the stroke.  He has a history of carotid artery plaque and has undergone carotid ultrasounds in the past. He is aware of a planned future ultrasound to monitor this condition. Physical Exam MEASUREMENTS: Weight- 174. CARDIOVASCULAR: Heart murmur present, not loud. MUSCULOSKELETAL: Gait steady. NEUROLOGICAL: Gait steady. Results LABS A1c: 7.1 (11/02/2023)  RADIOLOGY Head MRI: Blood clot in the posterior brain (August 2025)  DIAGNOSTIC Echocardiogram: Severe aortic stenosis (10/28/2023) Colonoscopy: Polyps, internal hemorrhoids, diverticulosis (January 2024) Vertebral artery occlusion: Occluded vertebral artery (August 2025) Assessment & Plan Recent ischemic stroke with residual balance impairment Ischemic stroke one month ago with balance impairment. Undergoing physical therapy with slow improvement. Residual symptoms include wooziness and sensation of being on a boat. Vertebral artery occlusion noted. - Continue physical therapy twice a week for six weeks. - Follow up with neurologist at [facility] on October 1st.  Patent foramen ovale and atrial septal defect Patent foramen ovale and atrial septal defect noted, increasing stroke risk. Awaiting cardiologist evaluation. - Follow up with Dr. Wonda at [facility] for further evaluation.  Paroxysmal atrial fibrillation Paroxysmal atrial fibrillation noted during EMS transport. On metoprolol  with no recent episodes. Eliquis  restarted. -  Continue metoprolol . - Continue Eliquis  as prescribed.  Severe aortic stenosis Severe aortic stenosis confirmed by echocardiogram. No syncope reported. - Follow up with cardiologist Dr. Alvan for further evaluation and  management.  Chronic diastolic heart failure Chronic diastolic heart failure with significant weight loss. No recent exacerbations. - Follow up with cardiologist as scheduled.  Type 2 diabetes mellitus, controlled Type 2 diabetes mellitus with improved A1c from 8.4 to 7.1. Managed with metformin . Occasional diarrhea reported. - Continue metformin .  Pure hypercholesterolemia Hypercholesterolemia managed with Lipitor . No severe muscle aches. Last cholesterol check in August. - Continue Lipitor . - Recheck cholesterol levels as needed.  Essential hypertension Essential hypertension managed with current medication regimen.  Tobacco use disorder, long-term Long-term tobacco use disorder. Previous quit attempts with patches unsuccessful. Discussed Chantix  for smoking cessation. - Prescribe Chantix . - Set a quit date and start Chantix  two weeks prior. - Continue Chantix  for three months.  General Health Maintenance Discussed vaccinations including flu and shingles. Flu shot recommended due to high risk. - Administer flu shot. - Consider shingles vaccine in the future.    The ASCVD Risk score (Arnett DK, et al., 2019) failed to calculate for the following reasons:   Risk score cannot be calculated because patient has a medical history suggesting prior/existing ASCVD  Past Medical History:  Diagnosis Date   Abnormal cardiovascular stress test 03/2013   Anxiety    Arthritis    Back pain    Cancer (HCC)    Prostate cancer-no treatment per pt   CHF (congestive heart failure) (HCC)    Coronary artery disease    Multivessel with significant LAD involvement by chest CT 2014   Depression    Diastolic dysfunction    Essential hypertension    Lumbar herniated disc    L4-L5   MRSA (methicillin resistant staph aureus) culture positive    Myocardial infarction (HCC)    Neuropathy    Shortness of breath dyspnea    Sleep apnea    No cpap   Stroke (HCC) 10/24/2023   Type 2 diabetes  mellitus (HCC)    Past Surgical History:  Procedure Laterality Date   ANTERIOR CERVICAL DECOMP/DISCECTOMY FUSION  01/18/2011   Procedure: ANTERIOR CERVICAL DECOMPRESSION/DISCECTOMY FUSION 2 LEVELS;  Surgeon: Victory LABOR Pool;  Location: [facility];  Service: Neurosurgery;  Laterality: N/A;  anterior cervical discectomy and fusion with allograft and plating, cervical five-six, cervical six-seven   BIOPSY N/A 04/04/2015   Procedure: BIOPSY;  Surgeon: Claudis RAYMOND Rivet, MD;  Location: [facility];  Service: Endoscopy;  Laterality: N/A;   BIOPSY  11/09/2019   Procedure: BIOPSY;  Surgeon: Eartha Angelia Sieving, MD;  Location: [facility];  Service: Gastroenterology;;   Bone spur removed  1997   Left shoulder   CARDIAC CATHETERIZATION N/A 08/08/2015   Procedure: Right/Left Heart Cath and Coronary Angiography;  Surgeon: Ozell Fell, MD;  Location: [facility] CV [facility];  Service: Cardiovascular;  Laterality: N/A;   CARPAL TUNNEL RELEASE Right    COLONOSCOPY WITH PROPOFOL  N/A 11/09/2019   Procedure: COLONOSCOPY WITH PROPOFOL ;  Surgeon: Eartha Angelia Sieving, MD;  Location: [facility];  Service: Gastroenterology;  Laterality: N/A;  900   COLONOSCOPY WITH PROPOFOL  N/A 02/27/2021   Procedure: COLONOSCOPY WITH PROPOFOL ;  Surgeon: Eartha Angelia Sieving, MD;  Location: [facility];  Service: Gastroenterology;  Laterality: N/A;  7:30   COLONOSCOPY WITH PROPOFOL  N/A 04/05/2022   Procedure: COLONOSCOPY WITH PROPOFOL ;  Surgeon: Shila Gustav GAILS, MD;  Location: [facility];  Service: Gastroenterology;  Laterality: N/A;   ENTEROSCOPY N/A 06/30/2022   Procedure: ENTEROSCOPY;  Surgeon: Eartha Angelia Sieving, MD;  Location: [facility];  Service: Gastroenterology;  Laterality: N/A;  2:00pm;ASA 3   ESOPHAGEAL DILATION N/A 04/04/2015   Procedure: ESOPHAGEAL DILATION;  Surgeon: Claudis RAYMOND Rivet, MD;  [facility]: [facility];  Service: Endoscopy;  Laterality: N/A;   ESOPHAGEAL DILATION N/A 11/09/2019    Procedure: ESOPHAGEAL DILATION;  Surgeon: Eartha Angelia Sieving, MD;  [facility]: [facility];  Service: Gastroenterology;  Laterality: N/A;   ESOPHAGOGASTRODUODENOSCOPY N/A 04/04/2015   Procedure: ESOPHAGOGASTRODUODENOSCOPY (EGD);  Surgeon: Claudis RAYMOND Rivet, MD;  [facility]: [facility];  Service: Endoscopy;  Laterality: N/A;  1240   ESOPHAGOGASTRODUODENOSCOPY (EGD) WITH PROPOFOL  N/A 11/09/2019   Procedure: ESOPHAGOGASTRODUODENOSCOPY (EGD) WITH PROPOFOL ;  Surgeon: Eartha Angelia Sieving, MD;  [facility];  Service: Gastroenterology;  Laterality: N/A;   ESOPHAGOGASTRODUODENOSCOPY (EGD) WITH PROPOFOL  N/A 04/04/2022   Procedure: ESOPHAGOGASTRODUODENOSCOPY (EGD) WITH PROPOFOL ;  Surgeon: Leigh Elspeth SQUIBB, MD;  Location: [facility];  Service: Gastroenterology;  Laterality: N/A;   ESOPHAGOGASTRODUODENOSCOPY (EGD) WITH PROPOFOL   06/30/2022   Procedure: ESOPHAGOGASTRODUODENOSCOPY (EGD) WITH PROPOFOL ;  Surgeon: Eartha Angelia Sieving, MD;  [facility]: [facility];  Service: Gastroenterology;;   GIVENS CAPSULE STUDY N/A 05/11/2022   Procedure: GIVENS CAPSULE STUDY;  Surgeon: Eartha Angelia Sieving, MD;  [facility]: [facility];  Service: Gastroenterology;  Laterality: N/A;  7:30 am   HEMORRHOID SURGERY     HOT HEMOSTASIS  06/30/2022   Procedure: HOT HEMOSTASIS (ARGON PLASMA COAGULATION/BICAP);  Surgeon: Eartha Angelia, Sieving, MD;  Location: [facility];  Service: Gastroenterology;;   POLYPECTOMY  11/09/2019   Procedure: POLYPECTOMY;  Surgeon: Eartha Angelia, Sieving, MD;  [facility]: [facility];  Service: Gastroenterology;;   POLYPECTOMY  02/27/2021   Procedure: POLYPECTOMY;  Surgeon: Eartha Angelia, Sieving, MD;  Location: [facility];  Service: Gastroenterology;;   POLYPECTOMY  04/05/2022   Procedure: POLYPECTOMY;  Surgeon: Shila Gustav GAILS, MD;  Location: [facility];  Service: Gastroenterology;;   TONSILLECTOMY     Social History   Tobacco Use   Smoking  status: Every Day    Current packs/day: 1.00    Average packs/day: 1 pack/day for 56.0 years (56.0 ttl pk-yrs)    Types: Cigarettes    Start date: 11/26/1967   Smokeless tobacco: Former   Tobacco comments:    smoking x 50 yrs  Vaping Use   Vaping status: Never Used  Substance Use Topics   Alcohol use: Not Currently    Comment: rare   Drug use: Yes    Frequency: 5.0 times per week    Types: Marijuana    Comment: as needed   Family History  Problem Relation Age of Onset   Lung cancer Mother    Diabetes Father    Hepatitis B Father    Diabetes Sister    Cushing syndrome Sister    Allergies  Allergen Reactions   Morphine And Codeine Nausea And Vomiting    ROS    Objective:    BP 100/62   Pulse 60   Temp 98.1 F (36.7 C)   Ht 5' 4 (1.626 m)   Wt 176 lb 2 oz (79.9 kg)   SpO2 97%   BMI 30.23 kg/m  BP Readings from Last 3 Encounters:  11/25/23 100/62  11/21/23 110/60  11/09/23 137/66   Wt Readings from Last 3 Encounters:  11/25/23 176 lb 2 oz (79.9 kg)  11/24/23 174 lb (78.9 kg)  11/21/23 174 lb (78.9 kg)    Physical Exam Vitals and nursing note reviewed.  Constitutional:      General: He is not in acute distress.    Appearance: Normal appearance.     Comments: Comes in with his wife  HENT:     Head: Normocephalic.     Right Ear: Tympanic membrane, ear canal and external ear normal.     Left Ear: Tympanic membrane, ear canal and external ear normal.  Eyes:     Extraocular Movements: Extraocular movements intact.     Conjunctiva/sclera: Conjunctivae normal.     Pupils: Pupils are equal, round, and reactive to light.  Cardiovascular:     Rate and Rhythm: Normal rate and regular rhythm.     Heart sounds: Normal heart sounds.  Pulmonary:     Effort: Pulmonary effort is normal.     Breath sounds: Normal breath sounds.  Musculoskeletal:     Right lower leg: No edema.     Left lower leg: No edema.  Neurological:     General: No focal deficit present.      Mental Status: He is alert and oriented to person, place, and time.  Psychiatric:        Mood and Affect: Mood normal.        Behavior: Behavior normal.        Thought Content: Thought content normal.        Judgment: Judgment normal.      No results found for any visits on 11/25/23.

## 2023-11-25 NOTE — Therapy (Signed)
 OUTPATIENT PHYSICAL THERAPY NEURO TREATMENT   Patient Name: Jonathan Gilmore MRN: [id number] DOB:1950-05-21, 73 y.o., male Today's Date: 11/25/2023     END OF SESSION:  PT End of Session - 11/25/23 1022     Visit Number 4    Number of Visits 12    Date for PT Re-Evaluation 12/27/23    Authorization Type [employer] MEDICARE    Authorization Time Period uhc approved 12 visits from 11/15/23-12/27/23 [phone]    Authorization - Visit Number 4    Authorization - Number of Visits 12    Progress Note Due on Visit 10    PT Start Time 1022    PT Stop Time 1100    PT Time Calculation (min) 38 min    Equipment Utilized During Treatment Gait belt    Activity Tolerance Patient tolerated treatment well    Behavior During Therapy WFL for tasks assessed/performed            Past Medical History:  Diagnosis Date   Abnormal cardiovascular stress test 03/2013   Anxiety    Arthritis    Back pain    Cancer (HCC)    Prostate cancer-no treatment per pt   CHF (congestive heart failure) (HCC)    Coronary artery disease    Multivessel with significant LAD involvement by chest CT 2014   Depression    Diastolic dysfunction    Essential hypertension    Lumbar herniated disc    L4-L5   MRSA (methicillin resistant staph aureus) culture positive    Myocardial infarction (HCC)    Neuropathy    Shortness of breath dyspnea    Sleep apnea    No cpap   Type 2 diabetes mellitus (HCC)    Past Surgical History:  Procedure Laterality Date   ANTERIOR CERVICAL DECOMP/DISCECTOMY FUSION  01/18/2011   Procedure: ANTERIOR CERVICAL DECOMPRESSION/DISCECTOMY FUSION 2 LEVELS;  Surgeon: Victory LABOR Pool;  Location: [facility];  Service: Neurosurgery;  Laterality: N/A;  anterior cervical discectomy and fusion with allograft and plating, cervical five-six, cervical six-seven   BIOPSY N/A 04/04/2015   Procedure: BIOPSY;  Surgeon: Claudis RAYMOND Rivet, MD;  Location: [facility];  Service: Endoscopy;  Laterality: N/A;    BIOPSY  11/09/2019   Procedure: BIOPSY;  Surgeon: Eartha Angelia Sieving, MD;  Location: [facility];  Service: Gastroenterology;;   Bone spur removed  1997   Left shoulder   CARDIAC CATHETERIZATION N/A 08/08/2015   Procedure: Right/Left Heart Cath and Coronary Angiography;  Surgeon: Ozell Fell, MD;  Location: [facility] INVASIVE CV LAB;  Service: Cardiovascular;  Laterality: N/A;   CARPAL TUNNEL RELEASE Right    COLONOSCOPY WITH PROPOFOL  N/A 11/09/2019   Procedure: COLONOSCOPY WITH PROPOFOL ;  Surgeon: Eartha Angelia Sieving, MD;  Location: [facility];  Service: Gastroenterology;  Laterality: N/A;  900   COLONOSCOPY WITH PROPOFOL  N/A 02/27/2021   Procedure: COLONOSCOPY WITH PROPOFOL ;  Surgeon: Eartha Angelia Sieving, MD;  [facility]: [facility];  Service: Gastroenterology;  Laterality: N/A;  7:30   COLONOSCOPY WITH PROPOFOL  N/A 04/05/2022   Procedure: COLONOSCOPY WITH PROPOFOL ;  Surgeon: Shila Gustav GAILS, MD;  Location: [facility];  Service: Gastroenterology;  Laterality: N/A;   ENTEROSCOPY N/A 06/30/2022   Procedure: ENTEROSCOPY;  Surgeon: Eartha Angelia Sieving, MD;  [facility]: [facility];  Service: Gastroenterology;  Laterality: N/A;  2:00pm;ASA 3   ESOPHAGEAL DILATION N/A 04/04/2015   Procedure: ESOPHAGEAL DILATION;  Surgeon: Claudis RAYMOND Rivet, MD;  [facility];  Service: Endoscopy;  Laterality: N/A;   ESOPHAGEAL DILATION N/A 11/09/2019   Procedure: ESOPHAGEAL DILATION;  Surgeon: Eartha Angelia Sieving, MD;  [facility];  Service: Gastroenterology;  Laterality: N/A;   ESOPHAGOGASTRODUODENOSCOPY N/A 04/04/2015   Procedure: ESOPHAGOGASTRODUODENOSCOPY (EGD);  Surgeon: Claudis RAYMOND Rivet, MD;  [facility];  Service: Endoscopy;  Laterality: N/A;  1240   ESOPHAGOGASTRODUODENOSCOPY (EGD) WITH PROPOFOL  N/A 11/09/2019   Procedure: ESOPHAGOGASTRODUODENOSCOPY (EGD) WITH PROPOFOL ;  Surgeon: Eartha Angelia Sieving, MD;  [facility];  Service:  Gastroenterology;  Laterality: N/A;   ESOPHAGOGASTRODUODENOSCOPY (EGD) WITH PROPOFOL  N/A 04/04/2022   Procedure: ESOPHAGOGASTRODUODENOSCOPY (EGD) WITH PROPOFOL ;  Surgeon: Leigh Elspeth SQUIBB, MD;  Location: [facility] ENDOSCOPY;  Service: Gastroenterology;  Laterality: N/A;   ESOPHAGOGASTRODUODENOSCOPY (EGD) WITH PROPOFOL   06/30/2022   Procedure: ESOPHAGOGASTRODUODENOSCOPY (EGD) WITH PROPOFOL ;  Surgeon: Eartha Angelia Sieving, MD;  [facility];  Service: Gastroenterology;;   GIVENS CAPSULE STUDY N/A 05/11/2022   Procedure: GIVENS CAPSULE STUDY;  Surgeon: Eartha Angelia Sieving, MD;  [facility];  Service: Gastroenterology;  Laterality: N/A;  7:30 am   HEMORRHOID SURGERY     HOT HEMOSTASIS  06/30/2022   Procedure: HOT HEMOSTASIS (ARGON PLASMA COAGULATION/BICAP);  Surgeon: Eartha Angelia, Sieving, MD;  [facility];  Service: Gastroenterology;;   POLYPECTOMY  11/09/2019   Procedure: POLYPECTOMY;  Surgeon: Eartha Angelia Sieving, MD;  [facility];  Service: Gastroenterology;;   POLYPECTOMY  02/27/2021   Procedure: POLYPECTOMY;  Surgeon: Eartha Angelia Sieving, MD;  [facility];  Service: Gastroenterology;;   POLYPECTOMY  04/05/2022   Procedure: POLYPECTOMY;  Surgeon: Shila Gustav GAILS, MD;  Location: [facility] ENDOSCOPY;  Service: Gastroenterology;;   TONSILLECTOMY     Patient Active Problem List   Diagnosis Date Noted   Acute CVA (cerebrovascular accident) (HCC) 10/28/2023   Paroxysmal atrial fibrillation (HCC) 10/28/2023   Hypokalemia 10/28/2023   Stroke (cerebrum) (HCC) 10/27/2023   Stroke (HCC) 10/27/2023   GERD (gastroesophageal reflux disease) 04/27/2023   Iron deficiency 04/08/2023   Belching 02/08/2023   Hx of fusion of cervical spine 11/22/2022   AVM (arteriovenous malformation) of small bowel, acquired 06/30/2022   Iron deficiency anemia due to chronic blood loss 04/22/2022   History of esophagitis 04/22/2022   Heme  positive stool 04/04/2022   Iron deficiency anemia 04/04/2022   Acute blood loss anemia 04/02/2022   Prostate cancer (HCC) 06/08/2021   History of colonic polyps 02/09/2021   Intestinal metaplasia of antrum of stomach without dysplasia 02/09/2021   Insomnia 03/12/2019   Pure hypercholesterolemia 11/29/2018   Hypogonadism, male 11/23/2018   Essential hypertension, benign 11/13/2018   High risk medication use 10/28/2018   Carotid stenosis, asymptomatic, bilateral 10/28/2018   Tobacco use 10/28/2018   Polyp of colon 10/28/2018   Lumbar disc herniation with radiculopathy 12/21/2017   Spinal stenosis of lumbar region without neurogenic claudication 12/21/2017   CAD -50% LAD  08/09/2015   Moderate aortic stenosis 08/09/2015   Acute pulmonary edema (HCC) 08/07/2015   Acute diastolic CHF (congestive heart failure) (HCC) 08/07/2015   Hypersomnia with sleep apnea 09/20/2013   Syncope 09/19/2013   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   OSA (obstructive sleep apnea) -non compliant 09/19/2013   Narcolepsy and cataplexy 09/19/2013   Facial abscess 04/28/2013   Facial cellulitis 04/28/2013   Mixed hyperlipidemia 04/28/2013   Tobacco dependence 04/28/2013   Sepsis (HCC) 04/28/2013   Gout 04/28/2013   Chronic diastolic CHF (congestive heart failure) (HCC) 04/28/2013   Pleuritic pain 02/24/2013  Chest pain 02/24/2013   Murmur 02/24/2013   Pericarditis 02/24/2013   Type 2 diabetes mellitus with hyperlipidemia (HCC) 02/24/2013   Essential hypertension 02/24/2013   Cervical spondylosis with myelopathy 01/19/2011   PCP: Levora Reyes SAUNDERS, MD  REFERRING PROVIDER: Noralee Elidia Sieving, MD ONSET DATE: 11/03/23  REFERRING DIAG: I63.9 (ICD-10-CM) - Acute CVA (cerebrovascular accident) (HCC)  THERAPY DIAG:  Difficulty in walking, not elsewhere classified  Impaired functional mobility, balance, and endurance  History of falling  Rationale for Evaluation and Treatment:  Rehabilitation  SUBJECTIVE:                                                                                                                                                                                             SUBJECTIVE STATEMENT: Pt states pain in the feet, 9/10. Back 7/10. Pt states he has a cousin about to pass away. Pts wife states last session really worked out his back.    (Initial) Pt states he lost balance when stroke occurred, was not using cane before the stroke happened. Pt states he was outiside weedeating when the stroke occurred and fell several times, was crawling around due to imbalance/dizziness. Pt denies any weakness or numbness related to the stroke, does have neuropahty due to diabetes. Pts wife states pt was leaning to the right side and right eye was drooping. Pt accompanied by: self and significant other  PERTINENT HISTORY:  Diabetes, neuropathy CHF Aortic Valve issue  PAIN:  Are you having pain? Yes: NPRS scale: 7/10 Pain location: bilateral shoulder Pain description: aches, sharp pain with movement Aggravating factors: sleeping, moving Relieving factors: laying on back  PRECAUTIONS: Fall  RED FLAGS: None   WEIGHT BEARING RESTRICTIONS: No  FALLS: Has patient fallen in last 6 months? Yes. Number of falls 6-8  LIVING ENVIRONMENT: Lives with: lives with their spouse Lives in: House/apartment Stairs: Yes: External: 2 steps; on right going up Has following equipment at home: Single point cane and Walker - 2 wheeled  PLOF: Independent and Independent with basic ADLs  PATIENT GOALS: improve balance and walking with LRAD  OBJECTIVE:  Note: Objective measures were completed at Evaluation unless otherwise noted.  DIAGNOSTIC FINDINGS:  EXAM: MRI BRAIN WITHOUT CONTRAST 10/27/2023 04:34:27 PM   TECHNIQUE: Multiplanar multisequence MRI of the head/brain was performed without the administration of intravenous contrast.   COMPARISON: MRI of the  brain dated 09/10/2000.   CLINICAL HISTORY: Syncope/presyncope, cerebrovascular cause suspected. Syncope; Limited positioning, pt combative.   FINDINGS:   BRAIN AND VENTRICLES: There is a new area of restricted diffusion present medially within the right cerebellar hemisphere. There is mild cerebellar  swelling with partial effacement of the fourth ventricle. There is mild periventricular and deep cerebral white matter disease present.   ORBITS: No acute abnormality.   SINUSES AND MASTOIDS: Polypoidal mucosal disease present within the floors of the maxillary sinuses.   BONES AND SOFT TISSUES: Normal marrow signal. No acute soft tissue abnormality.   IMPRESSION: 1. New area of restricted diffusion medially within the right cerebellar hemisphere with mild cerebellar swelling and partial effacement of the fourth ventricle, consistent with Nonhemorrhagic acute infarct. 2. Mild periventricular and deep cerebral white matter disease.   Electronically signed by: evalene coho 10/27/2023 05:20 PM EDT RP Workstation: [id number]  COGNITION: Overall cognitive status: Pts significant other reports changes in cognition although pt denies any change   SENSATION: Light touch: Impaired  Toes  COORDINATION: Pt demonstrates abnormal finger to nose testing with LUE  DTRs:  Not assessed this date   LOWER EXTREMITY ROM:     Active  Right Eval Left Eval  Hip flexion    Hip extension    Hip abduction    Hip adduction    Hip internal rotation    Hip external rotation    Knee flexion    Knee extension    Ankle dorsiflexion    Ankle plantarflexion    Ankle inversion    Ankle eversion     (Blank rows = not tested)  LOWER EXTREMITY MMT:    MMT Right Eval Left Eval  Hip flexion 4 4  Hip extension    Hip abduction 4 4  Hip adduction 4 4  Hip internal rotation    Hip external rotation    Knee flexion 4 4  Knee extension 4 4  Ankle dorsiflexion 3+ 3+  Ankle  plantarflexion    Ankle inversion    Ankle eversion    (Blank rows = not tested)    STAIRS: Findings: Stair Negotiation Technique: Alternating Pattern  with Single Rail on Left, Number of Stairs: 4, Height of Stairs: 8 inches   , and Comments: pt demonstrates good LE strength with alternating pattern but increased time required GAIT: Findings: Gait Characteristics: decreased stride length, wide BOS, and poor foot clearance- Right, Distance walked: 270 feet, Assistive device utilized:cane held in hand but did not use it and None, and Comments: pt demonstrates consistent early heel strike on RLE and occassional early heel strike on LLE. Pt states it is because of the shoewear, will reassess next visit with pt instructed to wear sneakers next session.   FUNCTIONAL TESTS:  5 times sit to stand: 11.53 seconds, minimal UE support 2 minute walk test: 270 ft SLS 11/15/23: R: 1.5 seconds L: 0.96 seconds  PATIENT SURVEYS:  ABC scale: 930/1600 - 58.1%                                                                                                                              TREATMENT DATE:  11/25/2023  Therapeutic Exercise: -Treadmill, 4 minutes,  1.2 speed, grade 2.0, pt cued for UE placement and speed -Side plank, 2 reps of 15 second holds, pt cued for LE  -Supine bridges 2 sets of 10 reps, 3 second holds, RTB at knees, pt cued for max hip extension Neuromuscular Re-education: -Bird dog, 1 set of 8 reps bilaterally, pt cued for sequencing  -Lateral stepping 2 laps 20 steps per lap, with GTB around ankles, pt cued for upright posture -Monster walks, 2 laps of 15 steps, pt requests to use cane, cued for sequencing -Tandem stance with cane assist as needed, 1 bout of 30 seconds bilaterally -Sit to stands with tidal tank column, 1 sets of 10 reps, pt cued for core activation   11/23/23: Nustep UE/LE average 77 spm x Gait training without AD x 400 min cueing to improve heel strike Bed  mobility, log rolling working on rolling, limited by shoulder pain Sidelying:  -Abduction 10x 3 Supine:  -Bridge 25x 3  -floating SLR 10x  -LTR 5x 10 Standing:  STS standard height 10x  Hurdles lateral sidestep in //bars 3RT 6in then 12in intermittent HHA reduced with reps.   11/18/23 Transfer training Sit to stand w/ arms crossed  Balance gait training integration Step up and over hurdle to airex with alternating SL hip flexion for improving functional foot clearance and SLS engagement Required UE A for maintaining balance/stability Side step over hurdle with cues for maintaining upright and for improving foot clearance and weight shift balance integration Required UE A and CGA for maintaining balance Ball roll out (20 x each) Improving lumbopelvic mobility and to mitigate pain Review of goals/education assessment with ABC scale Gait training 2 laps around clinic with cues for heel strike and foot clearance - maintained clearance and heel strike 30% of the time with L LE landing midfoot and ER > R LE landing heel strike  Verbal cues for stepping through mud as well as stepping over objects Heel / toe raises  10 each with education for performance at home in order to improve functional foot clearance during gait to reduce fall risk    PATIENT EDUCATION: Education details: Pt was educated on findings of PT evaluation, prognosis, frequency of therapy visits and rationale, attendance policy, and HEP if given.   Person educated: Patient and Spouse Education method: Explanation, Verbal cues, and Handouts Education comprehension: verbalized understanding, verbal cues required, and needs further education  HOME EXERCISE PROGRAM: Access Code: [email or web address] URL: [email or web address] Date: 11/15/2023 Prepared by: Lang Ada  Exercises - Standing Single Leg Stance with Counter Support  - 1 x daily - 7 x weekly - 1 sets - 3 reps - 30 hold - Sit to Stand with Arms Crossed  -  1 x daily - 7 x weekly - 3 sets - 10 reps - Supine Bridge  - 1 x daily - 7 x weekly - 3 sets - 10 reps - 5 hold  Access Code: [email or web address] URL: [email or web address] Date: 11/18/2023 Prepared by: Lamarr Citrin  Exercises - Heel Toe Raises with Counter Support  - 1 x daily - 7 x weekly - 3 sets - 10 reps  Access Code: [email or web address] URL: [email or web address] Date: 11/23/2023 Prepared by: Augustin Mclean  Exercises - Standing Single Leg Stance with Counter Support  - 1 x daily - 7 x weekly - 1 sets - 3 reps - 30 hold - Sit to Stand with Arms Crossed  - 1 x daily - 7 x weekly - 3 sets - 10 reps - Supine Bridge  -  1 x daily - 7 x weekly - 3 sets - 10 reps - 5 hold - Heel Toe Raises with Counter Support  - 1 x daily - 7 x weekly - 3 sets - 10 reps - Sidelying Hip Abduction  - 2 x daily - 7 x weekly - 3 sets - 10 reps - 5 hold  GOALS: Goals reviewed with patient? Yes  SHORT TERM GOALS: Target date: 12/06/23  Patient will demonstrate evidence of independence with individualized HEP and will report compliance for at least 3 days per week for optimized progression towards remaining therapy goals. Baseline:  Goal status: INITIAL  2.  Patient will report no falls for the next three weeks for demonstration of increased safety and community awareness for decreased risk of injury. Baseline: pt has fallen 6-8 times in the last 6 months Goal status: INITIAL     LONG TERM GOALS: Target date: 12/27/23  Pt will demonstrate a an increase of at least 15 percent on the ABC scale for improved confidence of balance during community ambulation and ADL. Baseline: see objective Goal status: INITIAL  2.  Pt will improve 2 MWT by 40 feet in order to demonstrate improved functional ambulatory capacity in community setting.  Baseline: see objective Goal status: INITIAL  3.  Pt will demonstrate increased MMT grade in RLE to at least a 4+/5, for increased strength during mobility  and maximal efficiency of gait cycle during ambulation. Baseline: see objective Goal status: INITIAL  4.  Pt will demonstrate the ability to get up from the floor in supine position to standing with modified independence and self capable strategies. Baseline: pt does not feel like he would be able to complete task at eval Goal status: INITIAL  5.  Pt will improve 5TSTS by 2.3 seconds in order to improve strength during functional activities. Baseline: see objective Goal status: INITIAL  6. Pt will demonstrate ability to improve SLS to 10 seconds bilaterally for improved balance and decreased fall risk. Baseline: see objective Goal status: INITIAL  ASSESSMENT:  CLINICAL IMPRESSION:  Patient continues to demonstrate pain in feet, back and shoulder, decreased LE/core strength, decreased gait quality and balance. Patient also demonstrates fair endurance with aerobic based exercise during today's session on treadmill. Patient able to progress dynamic balance and core activation exercises today with bird dogs and resisted walking, good performance with verbal cueing. Patient would continue to benefit from skilled physical therapy for increased endurance with ambulation, increased core/LE strength, and improved balance for improved quality of life, improved independence with gait training and continued progress towards therapy goals.   (Eval) Patient is a 73 y.o. male who was seen today for physical therapy evaluation and treatment for I63.9 (ICD-10-CM) - Acute CVA (cerebrovascular accident) (HCC). Patient demonstrates impaired coordination on LUE, decreased LE strength, abnormal pain rating in shoulders, abnormal gait pattern, and impaired balance. Patient also demonstrates difficulty with ambulation during today's session with decreased stride length, consistent early heel strike on RLE, and decreased velocity noted although able to complete with no AD this date. Patient also demonstrates  ability to navigate stairs with reciprocal pattern with use of one UE on railing ascending and descending. Patient requires education on role of PT, importance of frequent physical activity and importance of HEP compliance. Patient would benefit from skilled physical therapy for increased endurance/efficiency with ambulation, increased LE strength, and balance for improved gait quality, return to higher level of function with ADLs, and progress towards therapy goals.   OBJECTIVE  IMPAIRMENTS: Abnormal gait, decreased balance, decreased coordination, decreased endurance, decreased mobility, difficulty walking, decreased strength, impaired sensation, and pain.   ACTIVITY LIMITATIONS: carrying, lifting, bending, squatting, sleeping, transfers, and bed mobility  PARTICIPATION LIMITATIONS: meal prep, cleaning, laundry, driving, shopping, community activity, and yard work  PERSONAL FACTORS: Age, Past/current experiences, Time since onset of injury/illness/exacerbation, and 1-2 comorbidities: heart issues, diabetes are also affecting patient's functional outcome.   REHAB POTENTIAL: Fair neurological in nature  CLINICAL DECISION MAKING: Stable/uncomplicated  EVALUATION COMPLEXITY: Low  PLAN:  PT FREQUENCY: 1-2x/week  PT DURATION: 6 weeks  PLANNED INTERVENTIONS: 97110-Therapeutic exercises, 97530- Therapeutic activity, 97112- Neuromuscular re-education, 97535- Self Care, 02859- Manual therapy, [phone]- Gait training, Patient/Family education, Balance training, Stair training, and DME instructions  PLAN FOR NEXT SESSION: progress functional mobility (floor to standing transfer), progress gait and balance training, progress LE strengthening especially RLE  Lang Ada, PT, DPT [facility] Office: [phone] 11:04 AM, 11/25/23

## 2023-11-28 ENCOUNTER — Ambulatory Visit: Admitting: Orthopedic Surgery

## 2023-11-28 ENCOUNTER — Other Ambulatory Visit (HOSPITAL_COMMUNITY): Payer: Self-pay

## 2023-11-29 ENCOUNTER — Encounter (HOSPITAL_COMMUNITY): Payer: Self-pay

## 2023-11-29 ENCOUNTER — Ambulatory Visit (HOSPITAL_COMMUNITY)

## 2023-11-29 NOTE — Therapy (Signed)
 [facility] at [facility] [address] [place], [place], [address] Phone: [phone]   Fax:  [phone]  Patient Details  Name: Jonathan Gilmore MRN: [id number] Date of Birth: 08-23-1950 Referring Provider:  Levora Reyes SAUNDERS, MD  Encounter Date: 11/29/2023  Pt arrived for PT session.  Reports he is not feeling good and diarrhea at 3:00 this morning.  No session held today.    Augustin Mclean, LPTA/CLT; CBIS [phone]  Mclean Augustin Amble, PTA 11/29/2023, 7:24 AM  [facility] at [facility] [address] [place], [place], [address] Phone: [phone]   Fax:  [phone]

## 2023-11-30 ENCOUNTER — Other Ambulatory Visit: Payer: Self-pay | Admitting: *Deleted

## 2023-11-30 VITALS — Wt 174.0 lb

## 2023-11-30 DIAGNOSIS — I10 Essential (primary) hypertension: Secondary | ICD-10-CM

## 2023-11-30 DIAGNOSIS — Z720 Tobacco use: Secondary | ICD-10-CM

## 2023-11-30 DIAGNOSIS — I5032 Chronic diastolic (congestive) heart failure: Secondary | ICD-10-CM

## 2023-11-30 DIAGNOSIS — I639 Cerebral infarction, unspecified: Secondary | ICD-10-CM

## 2023-11-30 DIAGNOSIS — E1169 Type 2 diabetes mellitus with other specified complication: Secondary | ICD-10-CM

## 2023-11-30 NOTE — Transitions of Care (Post Inpatient/ED Visit) (Signed)
 Transition of Care week # 5/ day # 30 TOC 30-day program case closure with transfer to longitudinal RN CM  Visit Note  11/30/2023  Name: Jonathan Gilmore MRN: [id number]          DOB: [birth year]  Situation: Patient enrolled in [facility] 30-day program. Visit completed with patient by telephone.   HIPAA identifiers x 2 verified  Background:  Hospital admission August 14-17, 2025 for acute CVA without deficit Independent at baseline; supportive spouse assisting as indicated post- recent hospital discharge (1) unplanned hospital admission x last (6) and (12) months  Initial Transition Care Management Follow-up Telephone Call    Past Medical History:  Diagnosis Date   Abnormal cardiovascular stress test 03/2013   Anxiety    Arthritis    Back pain    Cancer (HCC)    Prostate cancer-no treatment per pt   CHF (congestive heart failure) (HCC)    Coronary artery disease    Multivessel with significant LAD involvement by chest CT 2014   Depression    Diastolic dysfunction    Essential hypertension    Lumbar herniated disc    L4-L5   MRSA (methicillin resistant staph aureus) culture positive    Myocardial infarction (HCC)    Neuropathy    Shortness of breath dyspnea    Sleep apnea    No cpap   Stroke (HCC) 10/24/2023   Type 2 diabetes mellitus (HCC)    Assessment:  I am doing fine, still better- back to driving myself all the time now, going to the rehab sessions, which are helping, not having to use any kind of walking stick or walker.  The new PCP was good, good visit, but I am not taking the Chantix - it made me feel weird, not good at all.  Smoking very little and vaping some.  My weights are holding steady and I am only occasionally checking my blood pressures because they are always good.  I do still check my blood sugars.  I am having some fall allergies right now, my nose is a little runny, but I am taking some OTC medicine the doctor told me to, it's helping and getting better     Denies clinical concerns and sounds to be in no distress throughout [facility] 30-day program outreach call today  Patient Reported Symptoms: Cognitive Cognitive Status: Normal speech and language skills, Alert and oriented to person, place, and time, Insightful and able to interpret abstract concepts Cognitive/Intellectual Conditions Management [RPT]: None reported or documented in medical history or problem list      Neurological Neurological Review of Symptoms: No symptoms reported [place] Neurological Symptoms/Conditions [RPT]: Recent CVA: reinforced signs/ symptoms CVA BEFAST/ action plan: patient able to verbalize with minimal prompting and denies signs/ symptoms today- confirms continuing to regularly attend outpatient rehabilitation sessions; confirmed plans to attend upcoming scheduled neurological provider office visit 12/14/23 Neurological Management Strategies: Routine screening, Adequate rest, Coping strategies, Activity  HEENT HEENT Symptoms Reported: Nasal discharge (Reports my fall allergies are kicked up like they are every year, I am taking an OTC allergy medicine that the doctor told me to take; encouraged him to contact PCP if symptoms worsen/ persist- he verbalizes agreement with same) HEENT Management Strategies: Coping strategies, Medication therapy, Routine screening    Cardiovascular Cardiovascular Symptoms Reported: No symptoms reported Other Cardiovascular Symptoms: Confirmed patient is currently obtaining daily weights at home: provided reinforcement re: rationale for daily weight monitoring at home as earliest indicator of fluid retention, along with weight gain  guidelines/ action plan for weight gain; importance of taking diuretic as prescribed; continues to report not consistently monitoring BP's at home- encouraged him to consider taking home BP's at least several times per week; he reports whenever I do take them they are fine; he has no BP values to share with me  today Does patient have uncontrolled Hypertension?: No Cardiovascular Management Strategies: Routine screening, Medication therapy, Coping strategies, Adequate rest, Weight management, Fluid modification, Exercise Do You Have a Working Readable Scale?: Yes Weight: 174 lb (78.9 kg) (home reported weight from 11/30/23)  Respiratory Respiratory Symptoms Reported: No symptoms reported Other Respiratory Symptoms: Denies shortness of breath/ cough; sounds to be in no respiratory distress throughout TOC call; reports continues to smoke a few cigarettes a day; as well as I vape in between; confirmed new PCP aware of same: patient reports he has not yet started taking recently prescribed Chantix : I took a couple and they made me feel off-kilter, I did not like the way they made me feel at all  Smoking cessation education/ strategies briefly reinforced with encouragement to continue efforts to not smoke Respiratory Management Strategies: Routine screening, Weight management, Coping strategies, Adequate rest  Endocrine Endocrine Symptoms Reported: No symptoms reported Is patient diabetic?: Yes Is patient checking blood sugars at home?: Yes List most recent blood sugar readings, include date and time of day: Confirmed patient continues monitoring blood sugars at home periodically- he does not have specific values to share today- is not near recorded values; they all are just fine and look good; encouraged him to continue monitoring blood sugars at home: confirmed no recent low bllood sugars;  Reinforced blood sugar management at home; need to follow carbohydrate modified diet; need to take medications as prescribed    Gastrointestinal Gastrointestinal Symptoms Reported: No symptoms reported Additional Gastrointestinal Details: Reports appetite fine, no change; regular and normal BM's Gastrointestinal Management Strategies: Coping strategies    Genitourinary Genitourinary Symptoms Reported: No symptoms  reported    Integumentary Integumentary Symptoms Reported: No symptoms reported    Musculoskeletal Musculoskelatal Symptoms Reviewed: No symptoms reported Additional Musculoskeletal Details: confirmed not currently requiring/ using assistive devices for ambulation- haven't needed in a long time; denies concerns around balance/ weakness; confirmed continues actively participating in outpatient rehabilitation as scheduled; confirmed no new/ recent falls;  provided education/ reinforcement around fall prevention Musculoskeletal Management Strategies: Routine screening, Coping strategies, Exercise Falls in the past year?: No Number of falls in past year: 1 or less Was there an injury with Fall?: No Fall Risk Category Calculator: 0 Patient Fall Risk Level: Low Fall Risk Patient at Risk for Falls Due to: Other (Comment) (Recent CVA) Fall risk Follow up: Education provided, Falls prevention discussed  Psychosocial Psychosocial Symptoms Reported: No symptoms reported Behavioral Management Strategies: Support system, Adequate rest, Coping strategies Major Change/Loss/Stressor/Fears (CP): Denies Techniques to Cope with Loss/Stress/Change: Diversional activities Quality of Family Relationships: helpful, involved, supportive   There were no vitals filed for this visit.  Medications Reviewed Today     Reviewed by Kanai Hilger M, RN (Registered Nurse) on 11/30/23 at 1142  Med List Status: <None>   Medication Order Taking? Sig Documenting Provider Last Dose Status Informant  ACCU-CHEK AVIVA PLUS test strip [id number]  USE AS INSTRUCTED Corum, Olam CROME, MD  Active Spouse/Significant Other, Pharmacy Records  apixaban  (ELIQUIS ) 5 MG TABS tablet [id number]  Take 1 tablet (5 mg total) by mouth 2 (two) times daily. Levora Reyes SAUNDERS, MD  Active   atorvastatin  (LIPITOR ) 80 MG tablet  [id number]  Take 1 tablet (80 mg total) by mouth daily. Alvan Dorn FALCON, MD  Active Spouse/Significant Other, Pharmacy Records   Calcium -Magnesium -Zinc (CAL-MAG-ZINC PO) [id number]  Take 1 tablet by mouth daily. [provider]  Active Spouse/Significant Other, Pharmacy Records  furosemide  (LASIX ) 40 MG tablet [id number]  Take 1 tablet (40 mg total) by mouth as needed for edema (swelling).  Patient taking differently: Take 40 mg by mouth as needed for edema (swelling). 10/31/23: Reports during TOC call, has been taking every day   Miriam Norris, NP  Active Spouse/Significant Other, Pharmacy Records  gabapentin  (NEURONTIN ) 300 MG capsule [id number]  TAKE 1 TO 2 CAPSULES BY MOUTH  TWICE DAILY Levora Reyes SAUNDERS, MD  Active Spouse/Significant Other, Pharmacy Records  lisinopril  (ZESTRIL ) 20 MG tablet [id number]  Take 1 tablet (20 mg total) by mouth daily. Levora Reyes SAUNDERS, MD  Active   metFORMIN  (GLUCOPHAGE ) 500 MG tablet [id number]  Take 2 tablets (1,000 mg total) by mouth 2 (two) times daily with a meal. Levora Reyes SAUNDERS, MD  Active   metoprolol  succinate (TOPROL -XL) 50 MG 24 hr tablet [id number]  TAKE 1 TABLET BY MOUTH DAILY  Patient taking differently: Take 50 mg by mouth every evening.   Alvan Dorn FALCON, MD  Active Spouse/Significant Other, Pharmacy Records  Multiple Vitamin (MULTIVITAMIN WITH MINERALS) TABS [id number]  Take 1 tablet by mouth daily. [provider]  Active Spouse/Significant Other, Pharmacy Records  nicotine  (NICODERM CQ  - DOSED IN MG/24 HOURS) 21 mg/24hr patch [id number]  Place 1 patch (21 mg total) onto the skin daily. Arrien, Elidia Sieving, MD  Active   nitroGLYCERIN  (NITROSTAT ) 0.4 MG SL tablet [id number]  Place 1 tablet (0.4 mg total) under the tongue every 5 (five) minutes x 3 doses as needed for chest pain (if no relief after 2nd dose, proceed to ED for an evaluation). Alvan Dorn FALCON, MD  Active Spouse/Significant Other, Pharmacy Records  Omega-3 Fatty Acids (OMEGA 3 FISH OIL PO) [id number]  Take 1 capsule by mouth daily. [provider]  Active   pantoprazole  (PROTONIX )  40 MG tablet [id number]  Take 1 tablet (40 mg total) by mouth 2 (two) times daily. Carlan, Chelsea L, NP  Active Spouse/Significant Other, Pharmacy Records  varenicline  (CHANTIX  CONTINUING MONTH PAK) 1 MG tablet [id number]  Take 1 tablet (1 mg total) by mouth 2 (two) times daily. Set quit date  Patient not taking: Reported on 11/30/2023   Aletha Bene, MD  Active            Recommendation:   Specialty provider follow-up: neurology provider 12/14/23- as scheduled Continue Current Plan of Care  Follow Up Plan:   Referral to RN Case Manager Closing From:  Transitions of Care Program  Pls call/ message for questions,  Mivaan Corbitt Mckinney Tamiah Dysart, RN, BSN, CCRN Alumnus RN Care Manager  Transitions of Care  [facility] - [facility] [phone]: direct office

## 2023-11-30 NOTE — Patient Instructions (Signed)
 Visit Information  Thank you for taking time to visit with me today. Please don't hesitate to contact me if I can be of assistance to you before our next scheduled telephone appointment.  It has been a pleasure working with you over the last 30 days!  Please listen for a call from the scheduling care guide to schedule a phone call with the new nurse care manager who will pick up in your care where we are leaving off today  Following are the goals we discussed today:  Patient Self Care Activities:  Attend all scheduled provider appointments Call provider office for new concerns or questions  Take medications as prescribed   Continue pacing activity as your recuperation from recent surgery continues Weigh yourself every day to stay on top of early fluid retention: write down your weights every day so you remember what it is from day to day: follow the weight-gain guidelines and action plan to call your doctor if you gain more than 3 lbs overnight, or 5 lbs in one week Continue monitoring and recording your blood pressures, daily weights, and blood sugars at home Eat a heart healthy and low salt diet If you believe your condition is getting worse- contact your care providers (doctors) promptly- reaching out to your doctor early when you have concerns can prevent you from having to go to the hospital Seek emergency care for any stroke symptoms You have fluid restrictions of no more than 2 liters per day  If you are experiencing a Mental Health or Behavioral Health Crisis or need someone to talk to, please  call the Suicide and Crisis Lifeline: 988 call the [place]  National Suicide Prevention Lifeline: 785-860-4574 or TTY: 516 382 0435 TTY 847-123-8553) to talk to a trained counselor call 1-800-273-TALK (toll free, 24 hour hotline) go to [facility] [address], [place] [phone]) call the [facility] Crisis Line: [phone] call 911    Patient verbalizes understanding of instructions and care plan provided today and agrees to view in MyChart. Active MyChart status and patient understanding of how to access instructions and care plan via MyChart confirmed with patient.     Pls call/ message for questions,  Areebah Meinders Mckinney Pj Zehner, RN, BSN, CCRN Alumnus RN Care Manager  Transitions of Care  [facility] - [facility] [phone]: direct office

## 2023-12-01 ENCOUNTER — Encounter (HOSPITAL_COMMUNITY): Payer: Self-pay

## 2023-12-01 ENCOUNTER — Encounter (HOSPITAL_COMMUNITY)

## 2023-12-01 ENCOUNTER — Ambulatory Visit (HOSPITAL_COMMUNITY)

## 2023-12-02 ENCOUNTER — Telehealth: Payer: Self-pay

## 2023-12-05 ENCOUNTER — Ambulatory Visit (HOSPITAL_COMMUNITY)
Admission: RE | Admit: 2023-12-05 | Discharge: 2023-12-05 | Disposition: A | Discharge status: Home / Self Care | Source: Ambulatory Visit | Attending: Acute Care | Admitting: Acute Care

## 2023-12-05 ENCOUNTER — Other Ambulatory Visit: Payer: Self-pay | Admitting: Nurse Practitioner

## 2023-12-05 DIAGNOSIS — R911 Solitary pulmonary nodule: Secondary | ICD-10-CM | POA: Diagnosis present

## 2023-12-06 ENCOUNTER — Ambulatory Visit (HOSPITAL_COMMUNITY)

## 2023-12-06 ENCOUNTER — Encounter

## 2023-12-06 ENCOUNTER — Telehealth: Payer: Self-pay | Admitting: Cardiology

## 2023-12-06 ENCOUNTER — Ambulatory Visit: Admitting: Nurse Practitioner

## 2023-12-06 ENCOUNTER — Encounter (HOSPITAL_COMMUNITY): Payer: Self-pay

## 2023-12-06 DIAGNOSIS — R262 Difficulty in walking, not elsewhere classified: Secondary | ICD-10-CM

## 2023-12-06 DIAGNOSIS — Z7409 Other reduced mobility: Secondary | ICD-10-CM

## 2023-12-06 DIAGNOSIS — Z9181 History of falling: Secondary | ICD-10-CM

## 2023-12-06 NOTE — Telephone Encounter (Signed)
 Pt came into office and stated he needs a new RX for his furosemide  (LASIX ) 40 MG tablet, he stated he uses [facility] mail order or [facility] on [address]. The pt stated he is trying to get his RX refilled and it's saying he has plenty of refills but they still will not refill the medication. [phone] is the best number to reach the pt.

## 2023-12-06 NOTE — Therapy (Signed)
 OUTPATIENT PHYSICAL THERAPY NEURO TREATMENT   Patient Name: Jonathan Gilmore MRN: [id number] DOB:01-03-1951, 73 y.o., male Today's Date: 12/06/2023     END OF SESSION:  PT End of Session - 12/06/23 0724     Visit Number 6    Number of Visits 12    Date for Recertification  12/27/23    Authorization Type [employer] MEDICARE    Authorization Time Period uhc approved 12 visits from 11/15/23-12/27/23 [phone]    Authorization - Visit Number 6    Authorization - Number of Visits 12    Progress Note Due on Visit 10    PT Start Time 0730    PT Stop Time 0810    PT Time Calculation (min) 40 min    Equipment Utilized During Treatment Gait belt    Activity Tolerance Patient tolerated treatment well    Behavior During Therapy WFL for tasks assessed/performed             Past Medical History:  Diagnosis Date   Abnormal cardiovascular stress test 03/2013   Anxiety    Arthritis    Back pain    Cancer (HCC)    Prostate cancer-no treatment per pt   CHF (congestive heart failure) (HCC)    Coronary artery disease    Multivessel with significant LAD involvement by chest CT 2014   Depression    Diastolic dysfunction    Essential hypertension    Lumbar herniated disc    L4-L5   MRSA (methicillin resistant staph aureus) culture positive    Myocardial infarction (HCC)    Neuropathy    Shortness of breath dyspnea    Sleep apnea    No cpap   Stroke (HCC) 10/24/2023   Type 2 diabetes mellitus (HCC)    Past Surgical History:  Procedure Laterality Date   ANTERIOR CERVICAL DECOMP/DISCECTOMY FUSION  01/18/2011   Procedure: ANTERIOR CERVICAL DECOMPRESSION/DISCECTOMY FUSION 2 LEVELS;  Surgeon: Victory LABOR Pool;  Location: [facility];  Service: Neurosurgery;  Laterality: N/A;  anterior cervical discectomy and fusion with allograft and plating, cervical five-six, cervical six-seven   BIOPSY N/A 04/04/2015   Procedure: BIOPSY;  Surgeon: Claudis RAYMOND Rivet, MD;  Location: [facility];  Service:  Endoscopy;  Laterality: N/A;   BIOPSY  11/09/2019   Procedure: BIOPSY;  Surgeon: Eartha Angelia Sieving, MD;  Location: [facility];  Service: Gastroenterology;;   Bone spur removed  1997   Left shoulder   CARDIAC CATHETERIZATION N/A 08/08/2015   Procedure: Right/Left Heart Cath and Coronary Angiography;  Surgeon: Ozell Fell, MD;  Location: [facility] INVASIVE CV LAB;  Service: Cardiovascular;  Laterality: N/A;   CARPAL TUNNEL RELEASE Right    COLONOSCOPY WITH PROPOFOL  N/A 11/09/2019   Procedure: COLONOSCOPY WITH PROPOFOL ;  Surgeon: Eartha Angelia Sieving, MD;  [facility]: [facility];  Service: Gastroenterology;  Laterality: N/A;  900   COLONOSCOPY WITH PROPOFOL  N/A 02/27/2021   Procedure: COLONOSCOPY WITH PROPOFOL ;  Surgeon: Eartha Angelia Sieving, MD;  [facility];  Service: Gastroenterology;  Laterality: N/A;  7:30   COLONOSCOPY WITH PROPOFOL  N/A 04/05/2022   Procedure: COLONOSCOPY WITH PROPOFOL ;  Surgeon: Shila Gustav GAILS, MD;  Location: [facility] ENDOSCOPY;  Service: Gastroenterology;  Laterality: N/A;   ENTEROSCOPY N/A 06/30/2022   Procedure: ENTEROSCOPY;  Surgeon: Eartha Angelia Sieving, MD;  [facility]: [facility];  Service: Gastroenterology;  Laterality: N/A;  2:00pm;ASA 3   ESOPHAGEAL DILATION N/A 04/04/2015   Procedure: ESOPHAGEAL DILATION;  Surgeon: Claudis RAYMOND Rivet, MD;  [facility];  Service: Endoscopy;  Laterality: N/A;   ESOPHAGEAL DILATION N/A 11/09/2019   Procedure: ESOPHAGEAL DILATION;  Surgeon: Eartha Angelia Sieving, MD;  [facility];  Service: Gastroenterology;  Laterality: N/A;   ESOPHAGOGASTRODUODENOSCOPY N/A 04/04/2015   Procedure: ESOPHAGOGASTRODUODENOSCOPY (EGD);  Surgeon: Claudis RAYMOND Rivet, MD;  [facility];  Service: Endoscopy;  Laterality: N/A;  1240   ESOPHAGOGASTRODUODENOSCOPY (EGD) WITH PROPOFOL  N/A 11/09/2019   Procedure: ESOPHAGOGASTRODUODENOSCOPY (EGD) WITH PROPOFOL ;  Surgeon: Eartha Angelia Sieving, MD;   [facility];  Service: Gastroenterology;  Laterality: N/A;   ESOPHAGOGASTRODUODENOSCOPY (EGD) WITH PROPOFOL  N/A 04/04/2022   Procedure: ESOPHAGOGASTRODUODENOSCOPY (EGD) WITH PROPOFOL ;  Surgeon: Leigh Elspeth SQUIBB, MD;  Location: [facility] ENDOSCOPY;  Service: Gastroenterology;  Laterality: N/A;   ESOPHAGOGASTRODUODENOSCOPY (EGD) WITH PROPOFOL   06/30/2022   Procedure: ESOPHAGOGASTRODUODENOSCOPY (EGD) WITH PROPOFOL ;  Surgeon: Eartha Angelia Sieving, MD;  [facility];  Service: Gastroenterology;;   GIVENS CAPSULE STUDY N/A 05/11/2022   Procedure: GIVENS CAPSULE STUDY;  Surgeon: Eartha Angelia Sieving, MD;  [facility];  Service: Gastroenterology;  Laterality: N/A;  7:30 am   HEMORRHOID SURGERY     HOT HEMOSTASIS  06/30/2022   Procedure: HOT HEMOSTASIS (ARGON PLASMA COAGULATION/BICAP);  Surgeon: Eartha Angelia, Sieving, MD;  [facility];  Service: Gastroenterology;;   POLYPECTOMY  11/09/2019   Procedure: POLYPECTOMY;  Surgeon: Eartha Angelia Sieving, MD;  [facility];  Service: Gastroenterology;;   POLYPECTOMY  02/27/2021   Procedure: POLYPECTOMY;  Surgeon: Eartha Angelia Sieving, MD;  [facility];  Service: Gastroenterology;;   POLYPECTOMY  04/05/2022   Procedure: POLYPECTOMY;  Surgeon: Shila Gustav GAILS, MD;  Location: [facility] ENDOSCOPY;  Service: Gastroenterology;;   TONSILLECTOMY     Patient Active Problem List   Diagnosis Date Noted   Acute CVA (cerebrovascular accident) (HCC) 10/28/2023   Paroxysmal atrial fibrillation (HCC) 10/28/2023   Hypokalemia 10/28/2023   Stroke (cerebrum) (HCC) 10/27/2023   Stroke (HCC) 10/27/2023   GERD (gastroesophageal reflux disease) 04/27/2023   Iron deficiency 04/08/2023   Belching 02/08/2023   Hx of fusion of cervical spine 11/22/2022   AVM (arteriovenous malformation) of small bowel, acquired 06/30/2022   Iron deficiency anemia due to chronic blood loss 04/22/2022   History of  esophagitis 04/22/2022   Heme positive stool 04/04/2022   Iron deficiency anemia 04/04/2022   Prostate cancer (HCC) 06/08/2021   History of colonic polyps 02/09/2021   Intestinal metaplasia of antrum of stomach without dysplasia 02/09/2021   Insomnia 03/12/2019   Pure hypercholesterolemia 11/29/2018   Hypogonadism, male 11/23/2018   High risk medication use 10/28/2018   Carotid stenosis, asymptomatic, bilateral 10/28/2018   Tobacco use 10/28/2018   Polyp of colon 10/28/2018   Lumbar disc herniation with radiculopathy 12/21/2017   Spinal stenosis of lumbar region without neurogenic claudication 12/21/2017   CAD -50% LAD  08/09/2015   Moderate aortic stenosis 08/09/2015   Acute diastolic CHF (congestive heart failure) (HCC) 08/07/2015   Hypersomnia with sleep apnea 09/20/2013   Syncope 09/19/2013   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   OSA (obstructive sleep apnea) -non compliant 09/19/2013   Narcolepsy and cataplexy 09/19/2013   Facial abscess 04/28/2013   Facial cellulitis 04/28/2013   Mixed hyperlipidemia 04/28/2013   Tobacco dependence 04/28/2013   Sepsis (HCC) 04/28/2013   Gout 04/28/2013   Chronic diastolic CHF (congestive heart failure) (HCC) 04/28/2013   Pleuritic pain 02/24/2013   Murmur 02/24/2013   Pericarditis 02/24/2013   Type 2 diabetes mellitus with  hyperlipidemia (HCC) 02/24/2013   Essential hypertension 02/24/2013   Cervical spondylosis with myelopathy 01/19/2011   PCP: Levora Reyes SAUNDERS, MD  REFERRING PROVIDER: Noralee Elidia Sieving, MD ONSET DATE: 11/03/23  REFERRING DIAG: I63.9 (ICD-10-CM) - Acute CVA (cerebrovascular accident) (HCC)  THERAPY DIAG:  Difficulty in walking, not elsewhere classified  Impaired functional mobility, balance, and endurance  History of falling  Rationale for Evaluation and Treatment: Rehabilitation  SUBJECTIVE:                                                                                                                                                                                              SUBJECTIVE STATEMENT: Pt states he tripped over his dog but was able to catch himself on the kitchen counter. No pain reported this morning. Pt states he has been forgetting about the cane states that therapy has been helping with his balance.   (Initial) Pt states he lost balance when stroke occurred, was not using cane before the stroke happened. Pt states he was outiside weedeating when the stroke occurred and fell several times, was crawling around due to imbalance/dizziness. Pt denies any weakness or numbness related to the stroke, does have neuropahty due to diabetes. Pts wife states pt was leaning to the right side and right eye was drooping. Pt accompanied by: self and significant other  PERTINENT HISTORY:  Diabetes, neuropathy CHF Aortic Valve issue  PAIN:  Are you having pain? Yes: NPRS scale: 7/10 Pain location: bilateral shoulder Pain description: aches, sharp pain with movement Aggravating factors: sleeping, moving Relieving factors: laying on back  PRECAUTIONS: Fall  RED FLAGS: None   WEIGHT BEARING RESTRICTIONS: No  FALLS: Has patient fallen in last 6 months? Yes. Number of falls 6-8  LIVING ENVIRONMENT: Lives with: lives with their spouse Lives in: House/apartment Stairs: Yes: External: 2 steps; on right going up Has following equipment at home: Single point cane and Walker - 2 wheeled  PLOF: Independent and Independent with basic ADLs  PATIENT GOALS: improve balance and walking with LRAD  OBJECTIVE:  Note: Objective measures were completed at Evaluation unless otherwise noted.  DIAGNOSTIC FINDINGS:  EXAM: MRI BRAIN WITHOUT CONTRAST 10/27/2023 04:34:27 PM   TECHNIQUE: Multiplanar multisequence MRI of the head/brain was performed without the administration of intravenous contrast.   COMPARISON: MRI of the brain dated 09/10/2000.   CLINICAL  HISTORY: Syncope/presyncope, cerebrovascular cause suspected. Syncope; Limited positioning, pt combative.   FINDINGS:   BRAIN AND VENTRICLES: There is a new area of restricted diffusion present medially within the right cerebellar hemisphere. There is mild cerebellar swelling with partial effacement of the fourth ventricle.  There is mild periventricular and deep cerebral white matter disease present.   ORBITS: No acute abnormality.   SINUSES AND MASTOIDS: Polypoidal mucosal disease present within the floors of the maxillary sinuses.   BONES AND SOFT TISSUES: Normal marrow signal. No acute soft tissue abnormality.   IMPRESSION: 1. New area of restricted diffusion medially within the right cerebellar hemisphere with mild cerebellar swelling and partial effacement of the fourth ventricle, consistent with Nonhemorrhagic acute infarct. 2. Mild periventricular and deep cerebral white matter disease.   Electronically signed by: evalene coho 10/27/2023 05:20 PM EDT RP Workstation: [id number]  COGNITION: Overall cognitive status: Pts significant other reports changes in cognition although pt denies any change   SENSATION: Light touch: Impaired  Toes  COORDINATION: Pt demonstrates abnormal finger to nose testing with LUE  DTRs:  Not assessed this date   LOWER EXTREMITY ROM:     Active  Right Eval Left Eval  Hip flexion    Hip extension    Hip abduction    Hip adduction    Hip internal rotation    Hip external rotation    Knee flexion    Knee extension    Ankle dorsiflexion    Ankle plantarflexion    Ankle inversion    Ankle eversion     (Blank rows = not tested)  LOWER EXTREMITY MMT:    MMT Right Eval Left Eval  Hip flexion 4 4  Hip extension    Hip abduction 4 4  Hip adduction 4 4  Hip internal rotation    Hip external rotation    Knee flexion 4 4  Knee extension 4 4  Ankle dorsiflexion 3+ 3+  Ankle plantarflexion    Ankle inversion    Ankle  eversion    (Blank rows = not tested)    STAIRS: Findings: Stair Negotiation Technique: Alternating Pattern  with Single Rail on Left, Number of Stairs: 4, Height of Stairs: 8 inches   , and Comments: pt demonstrates good LE strength with alternating pattern but increased time required GAIT: Findings: Gait Characteristics: decreased stride length, wide BOS, and poor foot clearance- Right, Distance walked: 270 feet, Assistive device utilized:cane held in hand but did not use it and None, and Comments: pt demonstrates consistent early heel strike on RLE and occassional early heel strike on LLE. Pt states it is because of the shoewear, will reassess next visit with pt instructed to wear sneakers next session.   FUNCTIONAL TESTS:  5 times sit to stand: 11.53 seconds, minimal UE support 2 minute walk test: 270 ft SLS 11/15/23: R: 1.5 seconds L: 0.96 seconds  PATIENT SURVEYS:  ABC scale: 930/1600 - 58.1%                                                                                                                              TREATMENT DATE:  12/06/2023  Therapeutic Exercise: -Treadmill, 5 minutes, 1.2>1.5>1.8 speed, grade 2.0, pt cued for UE  placement and speed -Heel/toe raises, 2 set of 10 reps, pt cued for increased ROM, bilateral UE support on parallel bars -Leg press, plate 4>6 2 sets of 10 reps, pt cued for eccentric control and decreased knee locking Neuromuscular Re-education: -Ankle weight walking marches/knee flexions, 2 laps on 20 foot line each variation, pt cued for max LE ROM -Aeromat walks(tandem/lateral stepping), in parallel bars/gait belt/SBA for safety,  3lb ankle weights, pt cued for decreased UE support when possible, 4 laps each variation -BOSU ball lunges, 2 sets of 7 reps, bilaterally, pt cued for decreased UE support and for proper LE placement -Sit to stands with tidal tank column, 2 sets of 10 reps, pt cued for max hip extension -Step up and overs, 8 inch step, 3lb  ankle weights, pt cued for decreased UE support  -Lateral step up and overs, 8 inch step, 3lb ankle weights, pt cued for decreased UE support   11/25/2023  Therapeutic Exercise: -Treadmill, 4 minutes, 1.2 speed, grade 2.0, pt cued for UE placement and speed -Side plank, 2 reps of 15 second holds, pt cued for LE  -Supine bridges 2 sets of 10 reps, 3 second holds, RTB at knees, pt cued for max hip extension Neuromuscular Re-education: -Bird dog, 1 set of 8 reps bilaterally, pt cued for sequencing  -Lateral stepping 2 laps 20 steps per lap, with GTB around ankles, pt cued for upright posture -Monster walks, 2 laps of 15 steps, pt requests to use cane, cued for sequencing -Tandem stance with cane assist as needed, 1 bout of 30 seconds bilaterally -Sit to stands with tidal tank column, 1 sets of 10 reps, pt cued for core activation   11/23/23: Nustep UE/LE average 77 spm x Gait training without AD x 400 min cueing to improve heel strike Bed mobility, log rolling working on rolling, limited by shoulder pain Sidelying:  -Abduction 10x 3 Supine:  -Bridge 25x 3  -floating SLR 10x  -LTR 5x 10 Standing:  STS standard height 10x  Hurdles lateral sidestep in //bars 3RT 6in then 12in intermittent HHA reduced with reps.   PATIENT EDUCATION: Education details: Pt was educated on findings of PT evaluation, prognosis, frequency of therapy visits and rationale, attendance policy, and HEP if given.   Person educated: Patient and Spouse Education method: Explanation, Verbal cues, and Handouts Education comprehension: verbalized understanding, verbal cues required, and needs further education  HOME EXERCISE PROGRAM: Access Code: [email or web address] URL: [email or web address] Date: 11/15/2023 Prepared by: Lang Ada  Exercises - Standing Single Leg Stance with Counter Support  - 1 x daily - 7 x weekly - 1 sets - 3 reps - 30 hold - Sit to Stand with Arms Crossed  - 1 x daily - 7 x  weekly - 3 sets - 10 reps - Supine Bridge  - 1 x daily - 7 x weekly - 3 sets - 10 reps - 5 hold  Access Code: [email or web address] URL: [email or web address] Date: 11/18/2023 Prepared by: Lamarr Citrin  Exercises - Heel Toe Raises with Counter Support  - 1 x daily - 7 x weekly - 3 sets - 10 reps  Access Code: [email or web address] URL: [email or web address] Date: 11/23/2023 Prepared by: Augustin Mclean  Exercises - Standing Single Leg Stance with Counter Support  - 1 x daily - 7 x weekly - 1 sets - 3 reps - 30 hold - Sit to Stand with Arms Crossed  - 1 x daily - 7 x weekly - 3 sets - 10 reps - Supine Bridge  -  1 x daily - 7 x weekly - 3 sets - 10 reps - 5 hold - Heel Toe Raises with Counter Support  - 1 x daily - 7 x weekly - 3 sets - 10 reps - Sidelying Hip Abduction  - 2 x daily - 7 x weekly - 3 sets - 10 reps - 5 hold  GOALS: Goals reviewed with patient? Yes  SHORT TERM GOALS: Target date: 12/06/23  Patient will demonstrate evidence of independence with individualized HEP and will report compliance for at least 3 days per week for optimized progression towards remaining therapy goals. Baseline:  Goal status: INITIAL  2.  Patient will report no falls for the next three weeks for demonstration of increased safety and community awareness for decreased risk of injury. Baseline: pt has fallen 6-8 times in the last 6 months Goal status: INITIAL     LONG TERM GOALS: Target date: 12/27/23  Pt will demonstrate a an increase of at least 15 percent on the ABC scale for improved confidence of balance during community ambulation and ADL. Baseline: see objective Goal status: INITIAL  2.  Pt will improve 2 MWT by 40 feet in order to demonstrate improved functional ambulatory capacity in community setting.  Baseline: see objective Goal status: INITIAL  3.  Pt will demonstrate increased MMT grade in RLE to at least a 4+/5, for increased strength during mobility and maximal  efficiency of gait cycle during ambulation. Baseline: see objective Goal status: INITIAL  4.  Pt will demonstrate the ability to get up from the floor in supine position to standing with modified independence and self capable strategies. Baseline: pt does not feel like he would be able to complete task at eval Goal status: INITIAL  5.  Pt will improve 5TSTS by 2.3 seconds in order to improve strength during functional activities. Baseline: see objective Goal status: INITIAL  6. Pt will demonstrate ability to improve SLS to 10 seconds bilaterally for improved balance and decreased fall risk. Baseline: see objective Goal status: INITIAL  ASSESSMENT:  CLINICAL IMPRESSION:  Patient continues to demonstrate decreased RLE strength, decreased gait quality and balance. Patient also demonstrates decreased endurance with aerobic based exercise during today's session, multiple rest breaks required. Patient able to progress dynamic balance and core activation exercises today with STS variations and resisted walking, good performance with verbal cueing. Patient would continue to benefit from skilled physical therapy for increased endurance with ambulation, increased LE strength, and improved balance for improved quality of life, improved independence with gait training and continued progress towards therapy goals.   (Eval) Patient is a 73 y.o. male who was seen today for physical therapy evaluation and treatment for I63.9 (ICD-10-CM) - Acute CVA (cerebrovascular accident) (HCC). Patient demonstrates impaired coordination on LUE, decreased LE strength, abnormal pain rating in shoulders, abnormal gait pattern, and impaired balance. Patient also demonstrates difficulty with ambulation during today's session with decreased stride length, consistent early heel strike on RLE, and decreased velocity noted although able to complete with no AD this date. Patient also demonstrates ability to navigate stairs with  reciprocal pattern with use of one UE on railing ascending and descending. Patient requires education on role of PT, importance of frequent physical activity and importance of HEP compliance. Patient would benefit from skilled physical therapy for increased endurance/efficiency with ambulation, increased LE strength, and balance for improved gait quality, return to higher level of function with ADLs, and progress towards therapy goals.   OBJECTIVE IMPAIRMENTS: Abnormal gait, decreased  balance, decreased coordination, decreased endurance, decreased mobility, difficulty walking, decreased strength, impaired sensation, and pain.   ACTIVITY LIMITATIONS: carrying, lifting, bending, squatting, sleeping, transfers, and bed mobility  PARTICIPATION LIMITATIONS: meal prep, cleaning, laundry, driving, shopping, community activity, and yard work  PERSONAL FACTORS: Age, Past/current experiences, Time since onset of injury/illness/exacerbation, and 1-2 comorbidities: heart issues, diabetes are also affecting patient's functional outcome.   REHAB POTENTIAL: Fair neurological in nature  CLINICAL DECISION MAKING: Stable/uncomplicated  EVALUATION COMPLEXITY: Low  PLAN:  PT FREQUENCY: 1-2x/week  PT DURATION: 6 weeks  PLANNED INTERVENTIONS: 97110-Therapeutic exercises, 97530- Therapeutic activity, 97112- Neuromuscular re-education, 97535- Self Care, [id number]- Manual therapy, [phone]- Gait training, Patient/Family education, Balance training, Stair training, and DME instructions  PLAN FOR NEXT SESSION: progress functional mobility (floor to standing transfer), progress gait and balance training, progress LE strengthening especially RLE, encourage HEP  Lang Ada, PT, DPT [facility] Office: [phone] 8:16 AM, 12/06/23

## 2023-12-06 NOTE — Telephone Encounter (Signed)
 Lasix refill sent.

## 2023-12-08 ENCOUNTER — Encounter (HOSPITAL_COMMUNITY): Payer: Self-pay

## 2023-12-08 ENCOUNTER — Encounter (HOSPITAL_COMMUNITY)

## 2023-12-08 NOTE — Therapy (Signed)
 [facility] Outpatient Rehabilitation at [facility] [address] [place], [place], [address] Phone: [phone]   Fax:  [phone]  Patient Details  Name: Jonathan Gilmore MRN: [id number] Date of Birth: [birth year] Referring Provider:  No ref. provider found  Encounter Date: 12/08/2023  Attempted to call patient regarding no show appointment and got voicemail yet mail box was full therefore unable to leave VM.  Jonathan Gilmore, PT 12/08/2023, 10:51 AM  [facility] Outpatient Rehabilitation at [facility] [address] [place], [place], [address] Phone: [phone]   Fax:  [phone]

## 2023-12-08 NOTE — Therapy (Incomplete)
 OUTPATIENT PHYSICAL THERAPY NEURO TREATMENT   Patient Name: Jonathan Gilmore MRN: [id number] DOB:December 17, 1950, 73 y.o., male Today's Date: 12/08/2023     END OF SESSION:       Past Medical History:  Diagnosis Date   Abnormal cardiovascular stress test 03/2013   Anxiety    Arthritis    Back pain    Cancer (HCC)    Prostate cancer-no treatment per pt   CHF (congestive heart failure) (HCC)    Coronary artery disease    Multivessel with significant LAD involvement by chest CT 2014   Depression    Diastolic dysfunction    Essential hypertension    Lumbar herniated disc    L4-L5   MRSA (methicillin resistant staph aureus) culture positive    Myocardial infarction (HCC)    Neuropathy    Shortness of breath dyspnea    Sleep apnea    No cpap   Stroke (HCC) 10/24/2023   Type 2 diabetes mellitus (HCC)    Past Surgical History:  Procedure Laterality Date   ANTERIOR CERVICAL DECOMP/DISCECTOMY FUSION  01/18/2011   Procedure: ANTERIOR CERVICAL DECOMPRESSION/DISCECTOMY FUSION 2 LEVELS;  Surgeon: Victory LABOR Pool;  Location: [facility];  Service: Neurosurgery;  Laterality: N/A;  anterior cervical discectomy and fusion with allograft and plating, cervical five-six, cervical six-seven   BIOPSY N/A 04/04/2015   Procedure: BIOPSY;  Surgeon: Claudis RAYMOND Rivet, MD;  Location: [facility];  Service: Endoscopy;  Laterality: N/A;   BIOPSY  11/09/2019   Procedure: BIOPSY;  Surgeon: Eartha Angelia Sieving, MD;  Location: [facility];  Service: Gastroenterology;;   Bone spur removed  1997   Left shoulder   CARDIAC CATHETERIZATION N/A 08/08/2015   Procedure: Right/Left Heart Cath and Coronary Angiography;  Surgeon: Ozell Fell, MD;  Location: [facility] INVASIVE CV LAB;  Service: Cardiovascular;  Laterality: N/A;   CARPAL TUNNEL RELEASE Right    COLONOSCOPY WITH PROPOFOL  N/A 11/09/2019   Procedure: COLONOSCOPY WITH PROPOFOL ;  Surgeon: Eartha Angelia Sieving, MD;  Location: [facility];  Service:  Gastroenterology;  Laterality: N/A;  900   COLONOSCOPY WITH PROPOFOL  N/A 02/27/2021   Procedure: COLONOSCOPY WITH PROPOFOL ;  Surgeon: Eartha Angelia Sieving, MD;  [facility]: [facility];  Service: Gastroenterology;  Laterality: N/A;  7:30   COLONOSCOPY WITH PROPOFOL  N/A 04/05/2022   Procedure: COLONOSCOPY WITH PROPOFOL ;  Surgeon: Shila Gustav GAILS, MD;  Location: [facility];  Service: Gastroenterology;  Laterality: N/A;   ENTEROSCOPY N/A 06/30/2022   Procedure: ENTEROSCOPY;  Surgeon: Eartha Angelia Sieving, MD;  [facility]: [facility];  Service: Gastroenterology;  Laterality: N/A;  2:00pm;ASA 3   ESOPHAGEAL DILATION N/A 04/04/2015   Procedure: ESOPHAGEAL DILATION;  Surgeon: Claudis RAYMOND Rivet, MD;  [facility];  Service: Endoscopy;  Laterality: N/A;   ESOPHAGEAL DILATION N/A 11/09/2019   Procedure: ESOPHAGEAL DILATION;  Surgeon: Eartha Angelia Sieving, MD;  [facility];  Service: Gastroenterology;  Laterality: N/A;   ESOPHAGOGASTRODUODENOSCOPY N/A 04/04/2015   Procedure: ESOPHAGOGASTRODUODENOSCOPY (EGD);  Surgeon: Claudis RAYMOND Rivet, MD;  [facility];  Service: Endoscopy;  Laterality: N/A;  1240   ESOPHAGOGASTRODUODENOSCOPY (EGD) WITH PROPOFOL  N/A 11/09/2019   Procedure: ESOPHAGOGASTRODUODENOSCOPY (EGD) WITH PROPOFOL ;  Surgeon: Eartha Angelia Sieving, MD;  [facility];  Service: Gastroenterology;  Laterality: N/A;   ESOPHAGOGASTRODUODENOSCOPY (EGD) WITH PROPOFOL  N/A 04/04/2022   Procedure: ESOPHAGOGASTRODUODENOSCOPY (EGD) WITH PROPOFOL ;  Surgeon: Leigh Elspeth SQUIBB, MD;  Location: [facility] ENDOSCOPY;  Service: Gastroenterology;  Laterality: N/A;   ESOPHAGOGASTRODUODENOSCOPY (EGD) WITH  PROPOFOL   06/30/2022   Procedure: ESOPHAGOGASTRODUODENOSCOPY (EGD) WITH PROPOFOL ;  Surgeon: Eartha Angelia Sieving, MD;  [facility];  Service: Gastroenterology;;   GIVENS CAPSULE STUDY N/A 05/11/2022   Procedure: GIVENS CAPSULE STUDY;  Surgeon: Eartha Angelia Sieving, MD;  [facility]: [facility];  Service: Gastroenterology;  Laterality: N/A;  7:30 am   HEMORRHOID SURGERY     HOT HEMOSTASIS  06/30/2022   Procedure: HOT HEMOSTASIS (ARGON PLASMA COAGULATION/BICAP);  Surgeon: Eartha Angelia, Sieving, MD;  [facility]: [facility];  Service: Gastroenterology;;   POLYPECTOMY  11/09/2019   Procedure: POLYPECTOMY;  Surgeon: Eartha Angelia Sieving, MD;  [facility];  Service: Gastroenterology;;   POLYPECTOMY  02/27/2021   Procedure: POLYPECTOMY;  Surgeon: Eartha Angelia Sieving, MD;  [facility];  Service: Gastroenterology;;   POLYPECTOMY  04/05/2022   Procedure: POLYPECTOMY;  Surgeon: Shila Gustav GAILS, MD;  Location[facility] END[facility];  Service: Gastroenterology;;   TONSILLECTOMY     Patient Active Problem List   Diagnosis Date Noted   Acute CVA (cerebrovascular accident) (HCC) 10/28/2023   Paroxysmal atrial fibrillation (HCC) 10/28/2023   Hypokalemia 10/28/2023   Stroke (cerebrum) (HCC) 10/27/2023   Stroke (HCC) 10/27/2023   GERD (gastroesophageal reflux disease) 04/27/2023   Iron deficiency 04/08/2023   Belching 02/08/2023   Hx of fusion of cervical spine 11/22/2022   AVM (arteriovenous malformation) of small bowel, acquired 06/30/2022   Iron deficiency anemia due to chronic blood loss 04/22/2022   History of esophagitis 04/22/2022   Heme positive stool 04/04/2022   Iron deficiency anemia 04/04/2022   Prostate cancer (HCC) 06/08/2021   History of colonic polyps 02/09/2021   Intestinal metaplasia of antrum of stomach without dysplasia 02/09/2021   Insomnia 03/12/2019   Pure hypercholesterolemia 11/29/2018   Hypogonadism, male 11/23/2018   High risk medication use 10/28/2018   Carotid stenosis, asymptomatic, bilateral 10/28/2018   Tobacco use 10/28/2018   Polyp of colon 10/28/2018   Lumbar disc herniation with radiculopathy 12/21/2017   Spinal stenosis of lumbar region without neurogenic claudication  12/21/2017   CAD -50% LAD  08/09/2015   Moderate aortic stenosis 08/09/2015   Acute diastolic CHF (congestive heart failure) (HCC) 08/07/2015   Hypersomnia with sleep apnea 09/20/2013   Syncope 09/19/2013   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   OSA (obstructive sleep apnea) -non compliant 09/19/2013   Narcolepsy and cataplexy 09/19/2013   Facial abscess 04/28/2013   Facial cellulitis 04/28/2013   Mixed hyperlipidemia 04/28/2013   Tobacco dependence 04/28/2013   Sepsis (HCC) 04/28/2013   Gout 04/28/2013   Chronic diastolic CHF (congestive heart failure) (HCC) 04/28/2013   Pleuritic pain 02/24/2013   Murmur 02/24/2013   Pericarditis 02/24/2013   Type 2 diabetes mellitus with hyperlipidemia (HCC) 02/24/2013   Essential hypertension 02/24/2013   Cervical spondylosis with myelopathy 01/19/2011   PCP: Levora Reyes SAUNDERS, MD  REFERRING PROVIDER: Noralee Elidia Sieving, MD ONSET DATE: 11/03/23  REFERRING DIAG: I63.9 (ICD-10-CM) - Acute CVA (cerebrovascular accident) (HCC)  THERAPY DIAG:  Difficulty in walking, not elsewhere classified  Impaired functional mobility, balance, and endurance  History of falling  Rationale for Evaluation and Treatment: Rehabilitation  SUBJECTIVE:  SUBJECTIVE STATEMENT: Pt states ***  (Initial) Pt states he lost balance when stroke occurred, was not using cane before the stroke happened. Pt states he was outiside weedeating when the stroke occurred and fell several times, was crawling around due to imbalance/dizziness. Pt denies any weakness or numbness related to the stroke, does have neuropahty due to diabetes. Pts wife states pt was leaning to the right side and right eye was drooping. Pt accompanied by: self and significant other  PERTINENT HISTORY:   Diabetes, neuropathy CHF Aortic Valve issue  PAIN:  Are you having pain? Yes: NPRS scale: 7/10 Pain location: bilateral shoulder Pain description: aches, sharp pain with movement Aggravating factors: sleeping, moving Relieving factors: laying on back  PRECAUTIONS: Fall  RED FLAGS: None   WEIGHT BEARING RESTRICTIONS: No  FALLS: Has patient fallen in last 6 months? Yes. Number of falls 6-8  LIVING ENVIRONMENT: Lives with: lives with their spouse Lives in: House/apartment Stairs: Yes: External: 2 steps; on right going up Has following equipment at home: Single point cane and Walker - 2 wheeled  PLOF: Independent and Independent with basic ADLs  PATIENT GOALS: improve balance and walking with LRAD  OBJECTIVE:  Note: Objective measures were completed at Evaluation unless otherwise noted.  DIAGNOSTIC FINDINGS:  EXAM: MRI BRAIN WITHOUT CONTRAST 10/27/2023 04:34:27 PM   TECHNIQUE: Multiplanar multisequence MRI of the head/brain was performed without the administration of intravenous contrast.   COMPARISON: MRI of the brain dated 09/10/2000.   CLINICAL HISTORY: Syncope/presyncope, cerebrovascular cause suspected. Syncope; Limited positioning, pt combative.   FINDINGS:   BRAIN AND VENTRICLES: There is a new area of restricted diffusion present medially within the right cerebellar hemisphere. There is mild cerebellar swelling with partial effacement of the fourth ventricle. There is mild periventricular and deep cerebral white matter disease present.   ORBITS: No acute abnormality.   SINUSES AND MASTOIDS: Polypoidal mucosal disease present within the floors of the maxillary sinuses.   BONES AND SOFT TISSUES: Normal marrow signal. No acute soft tissue abnormality.   IMPRESSION: 1. New area of restricted diffusion medially within the right cerebellar hemisphere with mild cerebellar swelling and partial effacement of the fourth ventricle, consistent with  Nonhemorrhagic acute infarct. 2. Mild periventricular and deep cerebral white matter disease.   Electronically signed by: evalene coho 10/27/2023 05:20 PM EDT RP Workstation: [id number]  COGNITION: Overall cognitive status: Pts significant other reports changes in cognition although pt denies any change   SENSATION: Light touch: Impaired  Toes  COORDINATION: Pt demonstrates abnormal finger to nose testing with LUE  DTRs:  Not assessed this date   LOWER EXTREMITY ROM:     Active  Right Eval Left Eval  Hip flexion    Hip extension    Hip abduction    Hip adduction    Hip internal rotation    Hip external rotation    Knee flexion    Knee extension    Ankle dorsiflexion    Ankle plantarflexion    Ankle inversion    Ankle eversion     (Blank rows = not tested)  LOWER EXTREMITY MMT:    MMT Right Eval Left Eval  Hip flexion 4 4  Hip extension    Hip abduction 4 4  Hip adduction 4 4  Hip internal rotation    Hip external rotation    Knee flexion 4 4  Knee extension 4 4  Ankle dorsiflexion 3+ 3+  Ankle plantarflexion    Ankle inversion    Ankle  eversion    (Blank rows = not tested)    STAIRS: Findings: Stair Negotiation Technique: Alternating Pattern  with Single Rail on Left, Number of Stairs: 4, Height of Stairs: 8 inches   , and Comments: pt demonstrates good LE strength with alternating pattern but increased time required GAIT: Findings: Gait Characteristics: decreased stride length, wide BOS, and poor foot clearance- Right, Distance walked: 270 feet, Assistive device utilized:cane held in hand but did not use it and None, and Comments: pt demonstrates consistent early heel strike on RLE and occassional early heel strike on LLE. Pt states it is because of the shoewear, will reassess next visit with pt instructed to wear sneakers next session.   FUNCTIONAL TESTS:  5 times sit to stand: 11.53 seconds, minimal UE support 2 minute walk test: 270 ft SLS  11/15/23: R: 1.5 seconds L: 0.96 seconds  PATIENT SURVEYS:  ABC scale: 930/1600 - 58.1%                                                                                                                              TREATMENT DATE:  12/08/23 Therapeutic Exercise: *** -Treadmill, 5 minutes, 1.2>1.5>1.8 speed, grade 2.0, pt cued for UE placement and speed -Heel/toe raises, 2 set of 10 reps, pt cued for increased ROM, bilateral UE support on parallel bars -Leg press, plate 4>6 2 sets of 10 reps, pt cued for eccentric control and decreased knee locking Neuromuscular Re-education: -Ankle weight walking marches/knee flexions, 2 laps on 20 foot line each variation, pt cued for max LE ROM -Aeromat walks(tandem/lateral stepping), in parallel bars/gait belt/SBA for safety,  3lb ankle weights, pt cued for decreased UE support when possible, 4 laps each variation -BOSU ball lunges, 2 sets of 7 reps, bilaterally, pt cued for decreased UE support and for proper LE placement -Sit to stands with tidal tank column, 2 sets of 10 reps, pt cued for max hip extension -Step up and overs, 8 inch step, 3lb ankle weights, pt cued for decreased UE support  -Lateral step up and overs, 8 inch step, 3lb ankle weights, pt cued for decreased UE support   12/06/2023  Therapeutic Exercise: -Treadmill, 5 minutes, 1.2>1.5>1.8 speed, grade 2.0, pt cued for UE placement and speed -Heel/toe raises, 2 set of 10 reps, pt cued for increased ROM, bilateral UE support on parallel bars -Leg press, plate 4>6 2 sets of 10 reps, pt cued for eccentric control and decreased knee locking Neuromuscular Re-education: -Ankle weight walking marches/knee flexions, 2 laps on 20 foot line each variation, pt cued for max LE ROM -Aeromat walks(tandem/lateral stepping), in parallel bars/gait belt/SBA for safety,  3lb ankle weights, pt cued for decreased UE support when possible, 4 laps each variation -BOSU ball lunges, 2 sets of 7 reps, bilaterally, pt  cued for decreased UE support and for proper LE placement -Sit to stands with tidal tank column, 2 sets of 10 reps, pt cued for max hip extension -Step  up and overs, 8 inch step, 3lb ankle weights, pt cued for decreased UE support  -Lateral step up and overs, 8 inch step, 3lb ankle weights, pt cued for decreased UE support   11/25/2023  Therapeutic Exercise: -Treadmill, 4 minutes, 1.2 speed, grade 2.0, pt cued for UE placement and speed -Side plank, 2 reps of 15 second holds, pt cued for LE  -Supine bridges 2 sets of 10 reps, 3 second holds, RTB at knees, pt cued for max hip extension Neuromuscular Re-education: -Bird dog, 1 set of 8 reps bilaterally, pt cued for sequencing  -Lateral stepping 2 laps 20 steps per lap, with GTB around ankles, pt cued for upright posture -Monster walks, 2 laps of 15 steps, pt requests to use cane, cued for sequencing -Tandem stance with cane assist as needed, 1 bout of 30 seconds bilaterally -Sit to stands with tidal tank column, 1 sets of 10 reps, pt cued for core activation   11/23/23: Nustep UE/LE average 77 spm x Gait training without AD x 400 min cueing to improve heel strike Bed mobility, log rolling working on rolling, limited by shoulder pain Sidelying:  -Abduction 10x 3 Supine:  -Bridge 25x 3  -floating SLR 10x  -LTR 5x 10 Standing:  STS standard height 10x  Hurdles lateral sidestep in //bars 3RT 6in then 12in intermittent HHA reduced with reps.   PATIENT EDUCATION: Education details: Pt was educated on findings of PT evaluation, prognosis, frequency of therapy visits and rationale, attendance policy, and HEP if given.   Person educated: Patient and Spouse Education method: Explanation, Verbal cues, and Handouts Education comprehension: verbalized understanding, verbal cues required, and needs further education  HOME EXERCISE PROGRAM: Access Code: [email or web address] URL: [email or web address] Date: 11/15/2023 Prepared by:  Lang Ada  Exercises - Standing Single Leg Stance with Counter Support  - 1 x daily - 7 x weekly - 1 sets - 3 reps - 30 hold - Sit to Stand with Arms Crossed  - 1 x daily - 7 x weekly - 3 sets - 10 reps - Supine Bridge  - 1 x daily - 7 x weekly - 3 sets - 10 reps - 5 hold  Access Code: [email or web address] URL: [email or web address] Date: 11/18/2023 Prepared by: Lamarr Citrin  Exercises - Heel Toe Raises with Counter Support  - 1 x daily - 7 x weekly - 3 sets - 10 reps  Access Code: [email or web address] URL: [email or web address] Date: 11/23/2023 Prepared by: Augustin Mclean  Exercises - Standing Single Leg Stance with Counter Support  - 1 x daily - 7 x weekly - 1 sets - 3 reps - 30 hold - Sit to Stand with Arms Crossed  - 1 x daily - 7 x weekly - 3 sets - 10 reps - Supine Bridge  - 1 x daily - 7 x weekly - 3 sets - 10 reps - 5 hold - Heel Toe Raises with Counter Support  - 1 x daily - 7 x weekly - 3 sets - 10 reps - Sidelying Hip Abduction  - 2 x daily - 7 x weekly - 3 sets - 10 reps - 5 hold  GOALS: Goals reviewed with patient? Yes  SHORT TERM GOALS: Target date: 12/06/23  Patient will demonstrate evidence of independence with individualized HEP and will report compliance for at least 3 days per week for optimized progression towards remaining therapy goals. Baseline:  Goal status: INITIAL  2.  Patient will report no falls for the next three weeks for demonstration of increased  safety and community awareness for decreased risk of injury. Baseline: pt has fallen 6-8 times in the last 6 months Goal status: INITIAL     LONG TERM GOALS: Target date: 12/27/23  Pt will demonstrate a an increase of at least 15 percent on the ABC scale for improved confidence of balance during community ambulation and ADL. Baseline: see objective Goal status: INITIAL  2.  Pt will improve 2 MWT by 40 feet in order to demonstrate improved functional ambulatory capacity in community  setting.  Baseline: see objective Goal status: INITIAL  3.  Pt will demonstrate increased MMT grade in RLE to at least a 4+/5, for increased strength during mobility and maximal efficiency of gait cycle during ambulation. Baseline: see objective Goal status: INITIAL  4.  Pt will demonstrate the ability to get up from the floor in supine position to standing with modified independence and self capable strategies. Baseline: pt does not feel like he would be able to complete task at eval Goal status: INITIAL  5.  Pt will improve 5TSTS by 2.3 seconds in order to improve strength during functional activities. Baseline: see objective Goal status: INITIAL  6. Pt will demonstrate ability to improve SLS to 10 seconds bilaterally for improved balance and decreased fall risk. Baseline: see objective Goal status: INITIAL  ASSESSMENT:  CLINICAL IMPRESSION:  Patient continues to demonstrate decreased RLE strength, decreased gait quality and balance. Patient also demonstrates decreased endurance with aerobic based exercise during today's session, multiple rest breaks required. Patient able to progress dynamic balance and core activation exercises today with STS variations and resisted walking, good performance with verbal cueing. Patient would continue to benefit from skilled physical therapy for increased endurance with ambulation, increased LE strength, and improved balance for improved quality of life, improved independence with gait training and continued progress towards therapy goals.   (Eval) Patient is a 73 y.o. male who was seen today for physical therapy evaluation and treatment for I63.9 (ICD-10-CM) - Acute CVA (cerebrovascular accident) (HCC). Patient demonstrates impaired coordination on LUE, decreased LE strength, abnormal pain rating in shoulders, abnormal gait pattern, and impaired balance. Patient also demonstrates difficulty with ambulation during today's session with decreased stride  length, consistent early heel strike on RLE, and decreased velocity noted although able to complete with no AD this date. Patient also demonstrates ability to navigate stairs with reciprocal pattern with use of one UE on railing ascending and descending. Patient requires education on role of PT, importance of frequent physical activity and importance of HEP compliance. Patient would benefit from skilled physical therapy for increased endurance/efficiency with ambulation, increased LE strength, and balance for improved gait quality, return to higher level of function with ADLs, and progress towards therapy goals.   OBJECTIVE IMPAIRMENTS: Abnormal gait, decreased balance, decreased coordination, decreased endurance, decreased mobility, difficulty walking, decreased strength, impaired sensation, and pain.   ACTIVITY LIMITATIONS: carrying, lifting, bending, squatting, sleeping, transfers, and bed mobility  PARTICIPATION LIMITATIONS: meal prep, cleaning, laundry, driving, shopping, community activity, and yard work  PERSONAL FACTORS: Age, Past/current experiences, Time since onset of injury/illness/exacerbation, and 1-2 comorbidities: heart issues, diabetes are also affecting patient's functional outcome.   REHAB POTENTIAL: Fair neurological in nature  CLINICAL DECISION MAKING: Stable/uncomplicated  EVALUATION COMPLEXITY: Low  PLAN:  PT FREQUENCY: 1-2x/week  PT DURATION: 6 weeks  PLANNED INTERVENTIONS: 97110-Therapeutic exercises, 97530- Therapeutic activity, [id number]- Neuromuscular re-education, 97535- Self Care, [id number]- Manual therapy, [phone]- Gait training, Patient/Family education, Balance training, Stair training, and DME instructions  PLAN  FOR NEXT SESSION: progress functional mobility (floor to standing transfer), progress gait and balance training, progress LE strengthening especially RLE, encourage HEP  Lamarr LITTIE Citrin PT, DPT [facility] [place]  [phone]  [phone] office  8:03 AM, 12/08/23

## 2023-12-13 ENCOUNTER — Encounter (HOSPITAL_COMMUNITY): Payer: Self-pay

## 2023-12-13 ENCOUNTER — Ambulatory Visit (HOSPITAL_COMMUNITY)

## 2023-12-13 DIAGNOSIS — Z9181 History of falling: Secondary | ICD-10-CM

## 2023-12-13 DIAGNOSIS — Z7409 Other reduced mobility: Secondary | ICD-10-CM | POA: Diagnosis not present

## 2023-12-13 DIAGNOSIS — R262 Difficulty in walking, not elsewhere classified: Secondary | ICD-10-CM

## 2023-12-13 NOTE — Therapy (Signed)
 OUTPATIENT PHYSICAL THERAPY NEURO TREATMENT   Patient Name: Jonathan Gilmore MRN: [id number] DOB:Sep 27, 1950, 73 y.o., male Today's Date: 12/13/2023     END OF SESSION:  PT End of Session - 12/13/23 0731     Visit Number 7    Number of Visits 12    Date for Recertification  12/27/23    Authorization Type [employer] MEDICARE    Authorization Time Period uhc approved 12 visits from 11/15/23-12/27/23 [phone]    Authorization - Visit Number 7    Authorization - Number of Visits 12    Progress Note Due on Visit 10    PT Start Time 0732    PT Stop Time 0812    PT Time Calculation (min) 40 min    Equipment Utilized During Treatment Gait belt    Activity Tolerance Patient tolerated treatment well    Behavior During Therapy WFL for tasks assessed/performed             Past Medical History:  Diagnosis Date   Abnormal cardiovascular stress test 03/2013   Anxiety    Arthritis    Back pain    Cancer (HCC)    Prostate cancer-no treatment per pt   CHF (congestive heart failure) (HCC)    Coronary artery disease    Multivessel with significant LAD involvement by chest CT 2014   Depression    Diastolic dysfunction    Essential hypertension    Lumbar herniated disc    L4-L5   MRSA (methicillin resistant staph aureus) culture positive    Myocardial infarction (HCC)    Neuropathy    Shortness of breath dyspnea    Sleep apnea    No cpap   Stroke (HCC) 10/24/2023   Type 2 diabetes mellitus (HCC)    Past Surgical History:  Procedure Laterality Date   ANTERIOR CERVICAL DECOMP/DISCECTOMY FUSION  01/18/2011   Procedure: ANTERIOR CERVICAL DECOMPRESSION/DISCECTOMY FUSION 2 LEVELS;  Surgeon: Victory LABOR Pool;  Location: [facility];  Service: Neurosurgery;  Laterality: N/A;  anterior cervical discectomy and fusion with allograft and plating, cervical five-six, cervical six-seven   BIOPSY N/A 04/04/2015   Procedure: BIOPSY;  Surgeon: Claudis RAYMOND Rivet, MD;  Location: [facility];  Service:  Endoscopy;  Laterality: N/A;   BIOPSY  11/09/2019   Procedure: BIOPSY;  Surgeon: Eartha Angelia Sieving, MD;  Location: [facility];  Service: Gastroenterology;;   Bone spur removed  1997   Left shoulder   CARDIAC CATHETERIZATION N/A 08/08/2015   Procedure: Right/Left Heart Cath and Coronary Angiography;  Surgeon: Ozell Fell, MD;  Location: [facility];  Service: Cardiovascular;  Laterality: N/A;   CARPAL TUNNEL RELEASE Right    COLONOSCOPY WITH PROPOFOL  N/A 11/09/2019   Procedure: COLONOSCOPY WITH PROPOFOL ;  Surgeon: Eartha Angelia Sieving, MD;  Location: [facility];  Service: Gastroenterology;  Laterality: N/A;  900   COLONOSCOPY WITH PROPOFOL  N/A 02/27/2021   Procedure: COLONOSCOPY WITH PROPOFOL ;  Surgeon: Eartha Angelia Sieving, MD;  Location: [facility];  Service: Gastroenterology;  Laterality: N/A;  7:30   COLONOSCOPY WITH PROPOFOL  N/A 04/05/2022   Procedure: COLONOSCOPY WITH PROPOFOL ;  Surgeon: Shila Gustav GAILS, MD;  Location: [facility];  Service: Gastroenterology;  Laterality: N/A;   ENTEROSCOPY N/A 06/30/2022   Procedure: ENTEROSCOPY;  Surgeon: Eartha Angelia Sieving, MD;  Location: [facility];  Service: Gastroenterology;  Laterality: N/A;  2:00pm;ASA 3   ESOPHAGEAL DILATION N/A 04/04/2015   Procedure: ESOPHAGEAL DILATION;  Surgeon: Claudis RAYMOND Rivet, MD;  [facility]: [facility];  Service: Endoscopy;  Laterality: N/A;   ESOPHAGEAL DILATION N/A 11/09/2019   Procedure: ESOPHAGEAL DILATION;  Surgeon: Eartha Angelia Sieving, MD;  [facility]: [facility];  Service: Gastroenterology;  Laterality: N/A;   ESOPHAGOGASTRODUODENOSCOPY N/A 04/04/2015   Procedure: ESOPHAGOGASTRODUODENOSCOPY (EGD);  Surgeon: Claudis RAYMOND Rivet, MD;  [facility]: [facility];  Service: Endoscopy;  Laterality: N/A;  1240   ESOPHAGOGASTRODUODENOSCOPY (EGD) WITH PROPOFOL  N/A 11/09/2019   Procedure: ESOPHAGOGASTRODUODENOSCOPY (EGD) WITH PROPOFOL ;  Surgeon: Eartha Angelia Sieving, MD;   [facility]: [facility];  Service: Gastroenterology;  Laterality: N/A;   ESOPHAGOGASTRODUODENOSCOPY (EGD) WITH PROPOFOL  N/A 04/04/2022   Procedure: ESOPHAGOGASTRODUODENOSCOPY (EGD) WITH PROPOFOL ;  Surgeon: Leigh Elspeth SQUIBB, MD;  Location: [facility];  Service: Gastroenterology;  Laterality: N/A;   ESOPHAGOGASTRODUODENOSCOPY (EGD) WITH PROPOFOL   06/30/2022   Procedure: ESOPHAGOGASTRODUODENOSCOPY (EGD) WITH PROPOFOL ;  Surgeon: Eartha Angelia Sieving, MD;  [facility];  Service: Gastroenterology;;   GIVENS CAPSULE STUDY N/A 05/11/2022   Procedure: GIVENS CAPSULE STUDY;  Surgeon: Eartha Angelia Sieving, MD;  [facility]: [facility];  Service: Gastroenterology;  Laterality: N/A;  7:30 am   HEMORRHOID SURGERY     HOT HEMOSTASIS  06/30/2022   Procedure: HOT HEMOSTASIS (ARGON PLASMA COAGULATION/BICAP);  Surgeon: Eartha Angelia, Sieving, MD;  Location: [facility];  Service: Gastroenterology;;   POLYPECTOMY  11/09/2019   Procedure: POLYPECTOMY;  Surgeon: Eartha Angelia Sieving, MD;  [facility]: [facility];  Service: Gastroenterology;;   POLYPECTOMY  02/27/2021   Procedure: POLYPECTOMY;  Surgeon: Eartha Angelia Sieving, MD;  [facility]: [facility];  Service: Gastroenterology;;   POLYPECTOMY  04/05/2022   Procedure: POLYPECTOMY;  Surgeon: Shila Gustav GAILS, MD;  Location: [facility];  Service: Gastroenterology;;   TONSILLECTOMY     Patient Active Problem List   Diagnosis Date Noted   Acute CVA (cerebrovascular accident) (HCC) 10/28/2023   Paroxysmal atrial fibrillation (HCC) 10/28/2023   Hypokalemia 10/28/2023   Stroke (cerebrum) (HCC) 10/27/2023   Stroke (HCC) 10/27/2023   GERD (gastroesophageal reflux disease) 04/27/2023   Iron deficiency 04/08/2023   Belching 02/08/2023   Hx of fusion of cervical spine 11/22/2022   AVM (arteriovenous malformation) of small bowel, acquired 06/30/2022   Iron deficiency anemia due to chronic blood loss 04/22/2022   History of  esophagitis 04/22/2022   Heme positive stool 04/04/2022   Iron deficiency anemia 04/04/2022   Prostate cancer (HCC) 06/08/2021   History of colonic polyps 02/09/2021   Intestinal metaplasia of antrum of stomach without dysplasia 02/09/2021   Insomnia 03/12/2019   Pure hypercholesterolemia 11/29/2018   Hypogonadism, male 11/23/2018   High risk medication use 10/28/2018   Carotid stenosis, asymptomatic, bilateral 10/28/2018   Tobacco use 10/28/2018   Polyp of colon 10/28/2018   Lumbar disc herniation with radiculopathy 12/21/2017   Spinal stenosis of lumbar region without neurogenic claudication 12/21/2017   CAD -50% LAD  08/09/2015   Moderate aortic stenosis 08/09/2015   Acute diastolic CHF (congestive heart failure) (HCC) 08/07/2015   Hypersomnia with sleep apnea 09/20/2013   Syncope 09/19/2013   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   OSA (obstructive sleep apnea) -non compliant 09/19/2013   Narcolepsy and cataplexy 09/19/2013   Facial abscess 04/28/2013   Facial cellulitis 04/28/2013   Mixed hyperlipidemia 04/28/2013   Tobacco dependence 04/28/2013   Sepsis (HCC) 04/28/2013   Gout 04/28/2013   Chronic diastolic CHF (congestive heart failure) (HCC) 04/28/2013   Pleuritic pain 02/24/2013   Murmur 02/24/2013   Pericarditis 02/24/2013   Type 2 diabetes mellitus with  hyperlipidemia (HCC) 02/24/2013   Essential hypertension 02/24/2013   Cervical spondylosis with myelopathy 01/19/2011   PCP: Levora Reyes SAUNDERS, MD  REFERRING PROVIDER: Noralee Elidia Sieving, MD ONSET DATE: 11/03/23  REFERRING DIAG: I63.9 (ICD-10-CM) - Acute CVA (cerebrovascular accident) (HCC)  THERAPY DIAG:  Difficulty in walking, not elsewhere classified  Impaired functional mobility, balance, and endurance  History of falling  Rationale for Evaluation and Treatment: Rehabilitation  SUBJECTIVE:                                                                                                                                                                                              SUBJECTIVE STATEMENT: Pt stated he has been without cane, feels his balance is improving a lot.  Does have some pain on lateral aspect of Lt foot around little toe, has been bothering him some.  Pain kept him up late last night.  (Initial) Pt states he lost balance when stroke occurred, was not using cane before the stroke happened. Pt states he was outiside weedeating when the stroke occurred and fell several times, was crawling around due to imbalance/dizziness. Pt denies any weakness or numbness related to the stroke, does have neuropahty due to diabetes. Pts wife states pt was leaning to the right side and right eye was drooping. Pt accompanied by: self and significant other  PERTINENT HISTORY:  Diabetes, neuropathy CHF Aortic Valve issue  PAIN:  Are you having pain? Yes: NPRS scale: 8/10 Pain location: lateral aspect Lt foot Pain description: aches, sharp pain with movement Aggravating factors: sleeping, moving Relieving factors: laying on back  PRECAUTIONS: Fall  RED FLAGS: None   WEIGHT BEARING RESTRICTIONS: No  FALLS: Has patient fallen in last 6 months? Yes. Number of falls 6-8  LIVING ENVIRONMENT: Lives with: lives with their spouse Lives in: House/apartment Stairs: Yes: External: 2 steps; on right going up Has following equipment at home: Single point cane and Walker - 2 wheeled  PLOF: Independent and Independent with basic ADLs  PATIENT GOALS: improve balance and walking with LRAD  OBJECTIVE:  Note: Objective measures were completed at Evaluation unless otherwise noted.  DIAGNOSTIC FINDINGS:  EXAM: MRI BRAIN WITHOUT CONTRAST 10/27/2023 04:34:27 PM   TECHNIQUE: Multiplanar multisequence MRI of the head/brain was performed without the administration of intravenous contrast.   COMPARISON: MRI of the brain dated 09/10/2000.   CLINICAL HISTORY: Syncope/presyncope,  cerebrovascular cause suspected. Syncope; Limited positioning, pt combative.   FINDINGS:   BRAIN AND VENTRICLES: There is a new area of restricted diffusion present medially within the right cerebellar hemisphere. There is mild cerebellar swelling with partial effacement of the  fourth ventricle. There is mild periventricular and deep cerebral white matter disease present.   ORBITS: No acute abnormality.   SINUSES AND MASTOIDS: Polypoidal mucosal disease present within the floors of the maxillary sinuses.   BONES AND SOFT TISSUES: Normal marrow signal. No acute soft tissue abnormality.   IMPRESSION: 1. New area of restricted diffusion medially within the right cerebellar hemisphere with mild cerebellar swelling and partial effacement of the fourth ventricle, consistent with Nonhemorrhagic acute infarct. 2. Mild periventricular and deep cerebral white matter disease.   Electronically signed by: evalene coho 10/27/2023 05:20 PM EDT RP Workstation: [id number]  COGNITION: Overall cognitive status: Pts significant other reports changes in cognition although pt denies any change   SENSATION: Light touch: Impaired  Toes  COORDINATION: Pt demonstrates abnormal finger to nose testing with LUE  DTRs:  Not assessed this date   LOWER EXTREMITY ROM:     Active  Right Eval Left Eval  Hip flexion    Hip extension    Hip abduction    Hip adduction    Hip internal rotation    Hip external rotation    Knee flexion    Knee extension    Ankle dorsiflexion    Ankle plantarflexion    Ankle inversion    Ankle eversion     (Blank rows = not tested)  LOWER EXTREMITY MMT:    MMT Right Eval Left Eval  Hip flexion 4 4  Hip extension    Hip abduction 4 4  Hip adduction 4 4  Hip internal rotation    Hip external rotation    Knee flexion 4 4  Knee extension 4 4  Ankle dorsiflexion 3+ 3+  Ankle plantarflexion    Ankle inversion    Ankle eversion    (Blank rows = not  tested)    STAIRS: Findings: Stair Negotiation Technique: Alternating Pattern  with Single Rail on Left, Number of Stairs: 4, Height of Stairs: 8 inches   , and Comments: pt demonstrates good LE strength with alternating pattern but increased time required GAIT: Findings: Gait Characteristics: decreased stride length, wide BOS, and poor foot clearance- Right, Distance walked: 270 feet, Assistive device utilized:cane held in hand but did not use it and None, and Comments: pt demonstrates consistent early heel strike on RLE and occassional early heel strike on LLE. Pt states it is because of the shoewear, will reassess next visit with pt instructed to wear sneakers next session.   FUNCTIONAL TESTS:  5 times sit to stand: 11.53 seconds, minimal UE support 2 minute walk test: 270 ft SLS 11/15/23: R: 1.5 seconds L: 0.96 seconds  PATIENT SURVEYS:  ABC scale: 930/1600 - 58.1%                                                                                                                              TREATMENT DATE:  12/13/23: - Heel/toe raises, 2 set of 10 reps, pt cued for increased  ROM, bilateral UE support on parallel bars - SLS average 3-5 max of 5 attempts - Tandem stance with one foot on 8in step height 2x 30 no HHA (cueing for posture) - Leg press 5Pl then 6Pl 2x 10  - Step up and overs, 8 inch step, no HHA - Standing hamstring curl - Balance beam with intermittent HHA tandem then sidestep 3RT  - 12in hurdles over balance beam 3RT forward and lateral step over.  12/06/2023  Therapeutic Exercise: -Treadmill, 5 minutes, 1.2>1.5>1.8 speed, grade 2.0, pt cued for UE placement and speed -Heel/toe raises, 2 set of 10 reps, pt cued for increased ROM, bilateral UE support on parallel bars -Leg press, plate 4>6 2 sets of 10 reps, pt cued for eccentric control and decreased knee locking Neuromuscular Re-education: -Ankle weight walking marches/knee flexions, 2 laps on 20 foot line each  variation, pt cued for max LE ROM -Aeromat walks(tandem/lateral stepping), in parallel bars/gait belt/SBA for safety,  3lb ankle weights, pt cued for decreased UE support when possible, 4 laps each variation -BOSU ball lunges, 2 sets of 7 reps, bilaterally, pt cued for decreased UE support and for proper LE placement -Sit to stands with tidal tank column, 2 sets of 10 reps, pt cued for max hip extension -Step up and overs, 8 inch step, 3lb ankle weights, pt cued for decreased UE support  -Lateral step up and overs, 8 inch step, 3lb ankle weights, pt cued for decreased UE support   11/25/2023  Therapeutic Exercise: -Treadmill, 4 minutes, 1.2 speed, grade 2.0, pt cued for UE placement and speed -Side plank, 2 reps of 15 second holds, pt cued for LE  -Supine bridges 2 sets of 10 reps, 3 second holds, RTB at knees, pt cued for max hip extension Neuromuscular Re-education: -Bird dog, 1 set of 8 reps bilaterally, pt cued for sequencing  -Lateral stepping 2 laps 20 steps per lap, with GTB around ankles, pt cued for upright posture -Monster walks, 2 laps of 15 steps, pt requests to use cane, cued for sequencing -Tandem stance with cane assist as needed, 1 bout of 30 seconds bilaterally -Sit to stands with tidal tank column, 1 sets of 10 reps, pt cued for core activation   11/23/23: Nustep UE/LE average 77 spm x Gait training without AD x 400 min cueing to improve heel strike Bed mobility, log rolling working on rolling, limited by shoulder pain Sidelying:  -Abduction 10x 3 Supine:  -Bridge 25x 3  -floating SLR 10x  -LTR 5x 10 Standing:  STS standard height 10x  Hurdles lateral sidestep in //bars 3RT 6in then 12in intermittent HHA reduced with reps.   PATIENT EDUCATION: Education details: Pt was educated on findings of PT evaluation, prognosis, frequency of therapy visits and rationale, attendance policy, and HEP if given.   Person educated: Patient and Spouse Education method:  Explanation, Verbal cues, and Handouts Education comprehension: verbalized understanding, verbal cues required, and needs further education  HOME EXERCISE PROGRAM: Access Code: [email or web address] URL: [email or web address] Date: 11/15/2023 Prepared by: Lang Ada  Exercises - Standing Single Leg Stance with Counter Support  - 1 x daily - 7 x weekly - 1 sets - 3 reps - 30 hold - Sit to Stand with Arms Crossed  - 1 x daily - 7 x weekly - 3 sets - 10 reps - Supine Bridge  - 1 x daily - 7 x weekly - 3 sets - 10 reps - 5 hold  Access Code: [email or web address] URL: [email or web address] Date: 11/18/2023  Prepared by: Lamarr Citrin  Exercises - Heel Toe Raises with Counter Support  - 1 x daily - 7 x weekly - 3 sets - 10 reps  Access Code: [email or web address] URL: [email or web address] Date: 11/23/2023 Prepared by: Augustin Mclean  Exercises - Standing Single Leg Stance with Counter Support  - 1 x daily - 7 x weekly - 1 sets - 3 reps - 30 hold - Sit to Stand with Arms Crossed  - 1 x daily - 7 x weekly - 3 sets - 10 reps - Supine Bridge  - 1 x daily - 7 x weekly - 3 sets - 10 reps - 5 hold - Heel Toe Raises with Counter Support  - 1 x daily - 7 x weekly - 3 sets - 10 reps - Sidelying Hip Abduction  - 2 x daily - 7 x weekly - 3 sets - 10 reps - 5 hold  GOALS: Goals reviewed with patient? Yes  SHORT TERM GOALS: Target date: 12/06/23  Patient will demonstrate evidence of independence with individualized HEP and will report compliance for at least 3 days per week for optimized progression towards remaining therapy goals. Baseline:  Goal status: INITIAL  2.  Patient will report no falls for the next three weeks for demonstration of increased safety and community awareness for decreased risk of injury. Baseline: pt has fallen 6-8 times in the last 6 months Goal status: INITIAL     LONG TERM GOALS: Target date: 12/27/23  Pt will demonstrate a an increase of at least 15  percent on the ABC scale for improved confidence of balance during community ambulation and ADL. Baseline: see objective Goal status: INITIAL  2.  Pt will improve 2 MWT by 40 feet in order to demonstrate improved functional ambulatory capacity in community setting.  Baseline: see objective Goal status: INITIAL  3.  Pt will demonstrate increased MMT grade in RLE to at least a 4+/5, for increased strength during mobility and maximal efficiency of gait cycle during ambulation. Baseline: see objective Goal status: INITIAL  4.  Pt will demonstrate the ability to get up from the floor in supine position to standing with modified independence and self capable strategies. Baseline: pt does not feel like he would be able to complete task at eval Goal status: INITIAL  5.  Pt will improve 5TSTS by 2.3 seconds in order to improve strength during functional activities. Baseline: see objective Goal status: INITIAL  6. Pt will demonstrate ability to improve SLS to 10 seconds bilaterally for improved balance and decreased fall risk. Baseline: see objective Goal status: INITIAL  ASSESSMENT:  CLINICAL IMPRESSION:  Session focus with balance and LE strengthening.  Cueing required for posture and form through session.  Improvements noted with minimal HHA required during functional strengthening exercises and good eccentric control following cueing.  Pt most limited with SLS and dynamic surface required cueing for posture and intermittent HHA.  Pt limited by Lt foot pain this session, monitored through session.     (Eval) Patient is a 73 y.o. male who was seen today for physical therapy evaluation and treatment for I63.9 (ICD-10-CM) - Acute CVA (cerebrovascular accident) (HCC). Patient demonstrates impaired coordination on LUE, decreased LE strength, abnormal pain rating in shoulders, abnormal gait pattern, and impaired balance. Patient also demonstrates difficulty with ambulation during today's session  with decreased stride length, consistent early heel strike on RLE, and decreased velocity noted although able to complete with no AD this date. Patient also demonstrates ability to navigate  stairs with reciprocal pattern with use of one UE on railing ascending and descending. Patient requires education on role of PT, importance of frequent physical activity and importance of HEP compliance. Patient would benefit from skilled physical therapy for increased endurance/efficiency with ambulation, increased LE strength, and balance for improved gait quality, return to higher level of function with ADLs, and progress towards therapy goals.   OBJECTIVE IMPAIRMENTS: Abnormal gait, decreased balance, decreased coordination, decreased endurance, decreased mobility, difficulty walking, decreased strength, impaired sensation, and pain.   ACTIVITY LIMITATIONS: carrying, lifting, bending, squatting, sleeping, transfers, and bed mobility  PARTICIPATION LIMITATIONS: meal prep, cleaning, laundry, driving, shopping, community activity, and yard work  PERSONAL FACTORS: Age, Past/current experiences, Time since onset of injury/illness/exacerbation, and 1-2 comorbidities: heart issues, diabetes are also affecting patient's functional outcome.   REHAB POTENTIAL: Fair neurological in nature  CLINICAL DECISION MAKING: Stable/uncomplicated  EVALUATION COMPLEXITY: Low  PLAN:  PT FREQUENCY: 1-2x/week  PT DURATION: 6 weeks  PLANNED INTERVENTIONS: 97110-Therapeutic exercises, 97530- Therapeutic activity, [id number]- Neuromuscular re-education, 97535- Self Care, [id number]- Manual therapy, [phone]- Gait training, Patient/Family education, Balance training, Stair training, and DME instructions  PLAN FOR NEXT SESSION: progress functional mobility (floor to standing transfer), progress gait and balance training, progress LE strengthening especially RLE, encourage HEP.  Next session add walkout, lunges and progress  balance.  Augustin Mclean, LPTA/CLT; CBIS [phone]  8:38 AM, 12/13/23

## 2023-12-13 NOTE — Progress Notes (Unsigned)
 Patient ID: Jonathan Gilmore MRN: [id number] DOB/AGE: [birth year] 73 y.o.  Primary Care Physician:Aguiar, Rafaela, MD Primary Cardiologist: Branch  CC:  Aortic valvular disease management     FOCUSED PROBLEM LIST:   Aortic stenosis AVA 0.64, DI 0.23, MG 20, V-max 2.7, SVI 21, EF 65 to 70% TTE August 2025 EKG atrial fibrillation without bundle-branch blocks Chronic diastolic heart failure Moderate LVH, PLFLGAS, ASD, EF 65-70% TTE August 2025 Secundum ASD Normal RV size and function TTE August 2025 PAF On Eliquis  Stroke August 2025 AR RVR >> ran out of Eliquis  CAD 50% proximal to mid LAD cath 2017 T2DM Not on insulin  Not a candidate for SGLT2 inhibitor due to urinary issues Hypertension Hyperlipidemia Aortic atherosclerosis Chest CT 2025 BMI 29/BSA 1.9 Tobacco abuse  October 2025:  Patient consents to use of AI scribe. The patient is a 73 year old male with the above listed medical problems referred for recommendations regarding severe aortic valve disease and a secundum ASD.  The patient was seen recently in the general cardiology clinic.  At that point in time he reported increasing fatigue and shortness of breath but no chest pain, presyncope, syncope, or palpitations.  Given the presence of paradoxical low-flow low gradient aortic stenosis on a recent echocardiogram as well as an ASD he is referred for further recommendations.   He does not typically experience shortness of breath during walking or routine activities, but notes increased fatigue compared to a few years ago. He attributes some limitations to leg pain and blockages in his legs, which he is addressing with physical therapy.  He has a history of atrial fibrillation and is on anticoagulation therapy. He experienced a stroke in August after running out of his medication. He is also diabetic, managed with metformin , and takes medication for high blood pressure and high cholesterol.  He has been smoking one pack  per day for 60 years. He experiences swelling in his feet, which he attributes to diabetic neuropathy. No lightheadedness, blacking out spells, or breathing difficulties when lying flat. He reports feeling perpetually cold for the past two years.  He uses a cane due to a foot issue described as an 'intrusion sticking out on the side' that is extremely tender. He finds it difficult to perform activities that require climbing or getting up from the ground, such as working under a car, due to leg pain and decreased flexibility    Past Medical History:  Diagnosis Date   Abnormal cardiovascular stress test 03/2013   Anxiety    Arthritis    Back pain    Cancer (HCC)    Prostate cancer-no treatment per pt   CHF (congestive heart failure) (HCC)    Coronary artery disease    Multivessel with significant LAD involvement by chest CT 2014   Depression    Diastolic dysfunction    Essential hypertension    Lumbar herniated disc    L4-L5   MRSA (methicillin resistant staph aureus) culture positive    Myocardial infarction (HCC)    Neuropathy    Shortness of breath dyspnea    Sleep apnea    No cpap   Stroke (HCC) 10/24/2023   Type 2 diabetes mellitus (HCC)     Past Surgical History:  Procedure Laterality Date   ANTERIOR CERVICAL DECOMP/DISCECTOMY FUSION  01/18/2011   Procedure: ANTERIOR CERVICAL DECOMPRESSION/DISCECTOMY FUSION 2 LEVELS;  Surgeon: Victory LABOR Pool;  Location: [facility];  Service: Neurosurgery;  Laterality: N/A;  anterior cervical discectomy and fusion with allograft and  plating, cervical five-six, cervical six-seven   BIOPSY N/A 04/04/2015   Procedure: BIOPSY;  Surgeon: Claudis RAYMOND Rivet, MD;  Location: [facility];  Service: Endoscopy;  Laterality: N/A;   BIOPSY  11/09/2019   Procedure: BIOPSY;  Surgeon: Eartha Angelia Sieving, MD;  Location: [facility];  Service: Gastroenterology;;   Bone spur removed  1997   Left shoulder   CARDIAC CATHETERIZATION N/A 08/08/2015    Procedure: Right/Left Heart Cath and Coronary Angiography;  Surgeon: Ozell Fell, MD;  Location: [facility];  Service: Cardiovascular;  Laterality: N/A;   CARPAL TUNNEL RELEASE Right    COLONOSCOPY WITH PROPOFOL  N/A 11/09/2019   Procedure: COLONOSCOPY WITH PROPOFOL ;  Surgeon: Eartha Angelia Sieving, MD;  Location: [facility];  Service: Gastroenterology;  Laterality: N/A;  900   COLONOSCOPY WITH PROPOFOL  N/A 02/27/2021   Procedure: COLONOSCOPY WITH PROPOFOL ;  Surgeon: Eartha Angelia Sieving, MD;  Location: [facility];  Service: Gastroenterology;  Laterality: N/A;  7:30   COLONOSCOPY WITH PROPOFOL  N/A 04/05/2022   Procedure: COLONOSCOPY WITH PROPOFOL ;  Surgeon: Shila Gustav GAILS, MD;  Location: [facility];  Service: Gastroenterology;  Laterality: N/A;   ENTEROSCOPY N/A 06/30/2022   Procedure: ENTEROSCOPY;  Surgeon: Eartha Angelia Sieving, MD;  Location: [facility];  Service: Gastroenterology;  Laterality: N/A;  2:00pm;ASA 3   ESOPHAGEAL DILATION N/A 04/04/2015   Procedure: ESOPHAGEAL DILATION;  Surgeon: Claudis RAYMOND Rivet, MD;  [facility]: [facility];  Service: Endoscopy;  Laterality: N/A;   ESOPHAGEAL DILATION N/A 11/09/2019   Procedure: ESOPHAGEAL DILATION;  Surgeon: Eartha Angelia Sieving, MD;  [facility];  Service: Gastroenterology;  Laterality: N/A;   ESOPHAGOGASTRODUODENOSCOPY N/A 04/04/2015   Procedure: ESOPHAGOGASTRODUODENOSCOPY (EGD);  Surgeon: Claudis RAYMOND Rivet, MD;  [facility];  Service: Endoscopy;  Laterality: N/A;  1240   ESOPHAGOGASTRODUODENOSCOPY (EGD) WITH PROPOFOL  N/A 11/09/2019   Procedure: ESOPHAGOGASTRODUODENOSCOPY (EGD) WITH PROPOFOL ;  Surgeon: Eartha Angelia Sieving, MD;  [facility];  Service: Gastroenterology;  Laterality: N/A;   ESOPHAGOGASTRODUODENOSCOPY (EGD) WITH PROPOFOL  N/A 04/04/2022   Procedure: ESOPHAGOGASTRODUODENOSCOPY (EGD) WITH PROPOFOL ;  Surgeon: Leigh Elspeth SQUIBB, MD;  Location: [facility];   Service: Gastroenterology;  Laterality: N/A;   ESOPHAGOGASTRODUODENOSCOPY (EGD) WITH PROPOFOL   06/30/2022   Procedure: ESOPHAGOGASTRODUODENOSCOPY (EGD) WITH PROPOFOL ;  Surgeon: Eartha Angelia Sieving, MD;  [facility];  Service: Gastroenterology;;   GIVENS CAPSULE STUDY N/A 05/11/2022   Procedure: GIVENS CAPSULE STUDY;  Surgeon: Eartha Angelia Sieving, MD;  [facility]: [facility];  Service: Gastroenterology;  Laterality: N/A;  7:30 am   HEMORRHOID SURGERY     HOT HEMOSTASIS  06/30/2022   Procedure: HOT HEMOSTASIS (ARGON PLASMA COAGULATION/BICAP);  Surgeon: Eartha Angelia, Sieving, MD;  [facility]: [facility];  Service: Gastroenterology;;   POLYPECTOMY  11/09/2019   Procedure: POLYPECTOMY;  Surgeon: Eartha Angelia Sieving, MD;  [facility];  Service: Gastroenterology;;   POLYPECTOMY  02/27/2021   Procedure: POLYPECTOMY;  Surgeon: Eartha Angelia Sieving, MD;  [facility]: [facility];  Service: Gastroenterology;;   POLYPECTOMY  04/05/2022   Procedure: POLYPECTOMY;  Surgeon: Shila Gustav GAILS, MD;  Location: [facility];  Service: Gastroenterology;;   TONSILLECTOMY      Family History  Problem Relation Age of Onset   Lung cancer Mother    Diabetes Father    Hepatitis B Father    Diabetes Sister    Cushing syndrome Sister     Social History   Socioeconomic History   Marital status: Married  Spouse name: Not on file   Number of children: Not on file   Years of education: Not on file   Highest education level: Not on file  Occupational History   Not on file  Tobacco Use   Smoking status: Every Day    Current packs/day: 1.00    Average packs/day: 1 pack/day for 56.0 years (56.0 ttl pk-yrs)    Types: Cigarettes    Start date: 11/26/1967   Smokeless tobacco: Former   Tobacco comments:    smoking x 50 yrs  Vaping Use   Vaping status: Never Used  Substance and Sexual Activity   Alcohol use: Not Currently    Comment: rare   Drug use: Yes     Frequency: 5.0 times per week    Types: Marijuana    Comment: as needed   Sexual activity: Yes  Other Topics Concern   Not on file  Social History Narrative   Lives with wife   Right Handed   Drinks 2-3 cups caffeine daily   Social Drivers of Health   Financial Resource Strain: Low Risk  (11/17/2022)   Overall Financial Resource Strain (CARDIA)    Difficulty of Paying Living Expenses: Not hard at all  Food Insecurity: No Food Insecurity (10/31/2023)   Hunger Vital Sign    Worried About Running Out of Food in the Last Year: Never true    Ran Out of Food in the Last Year: Never true  Transportation Needs: No Transportation Needs (10/31/2023)   PRAPARE - Administrator, Civil Service (Medical): No    Lack of Transportation (Non-Medical): No  Physical Activity: Inactive (11/17/2022)   Exercise Vital Sign    Days of Exercise per Week: 0 days    Minutes of Exercise per Session: 0 min  Stress: No Stress Concern Present (11/17/2022)   [employer] of Occupational Health - Occupational Stress Questionnaire    Feeling of Stress : Not at all  Social Connections: Unknown (10/27/2023)   Social Connection and Isolation Panel    Frequency of Communication with Friends and Family: More than three times a week    Frequency of Social Gatherings with Friends and Family: More than three times a week    Attends Religious Services: Patient declined    Database administrator or Organizations: Patient declined    Attends Banker Meetings: Patient declined    Marital Status: Married  Catering manager Violence: Not At Risk (10/31/2023)   Humiliation, Afraid, Rape, and Kick questionnaire    Fear of Current or Ex-Partner: No    Emotionally Abused: No    Physically Abused: No    Sexually Abused: No     Prior to Admission medications   Medication Sig Start Date End Date Taking? Authorizing Provider  ACCU-CHEK AVIVA PLUS test strip USE AS INSTRUCTED 06/13/19   Eldora Olam CROME, MD   apixaban  (ELIQUIS ) 5 MG TABS tablet Take 1 tablet (5 mg total) by mouth 2 (two) times daily. 11/02/23   Levora Reyes SAUNDERS, MD  atorvastatin  (LIPITOR ) 80 MG tablet Take 1 tablet (80 mg total) by mouth daily. 02/17/23   Alvan Dorn FALCON, MD  Calcium -Magnesium -Zinc (CAL-MAG-ZINC PO) Take 1 tablet by mouth daily.    [provider]  furosemide  (LASIX ) 40 MG tablet TAKE 1 TABLET BY MOUTH DAILY AS NEEDED FOR EDEMA(SWELLING) 12/06/23   Miriam Norris, NP  gabapentin  (NEURONTIN ) 300 MG capsule TAKE 1 TO 2 CAPSULES BY MOUTH  TWICE DAILY 06/03/23  Levora Reyes SAUNDERS, MD  lisinopril  (ZESTRIL ) 20 MG tablet Take 1 tablet (20 mg total) by mouth daily. 11/02/23 01/31/24  Levora Reyes SAUNDERS, MD  metFORMIN  (GLUCOPHAGE ) 500 MG tablet Take 2 tablets (1,000 mg total) by mouth 2 (two) times daily with a meal. 11/02/23   Levora Reyes SAUNDERS, MD  metoprolol  succinate (TOPROL -XL) 50 MG 24 hr tablet TAKE 1 TABLET BY MOUTH DAILY Patient taking differently: Take 50 mg by mouth every evening. 04/21/23   Alvan Dorn FALCON, MD  Multiple Vitamin (MULTIVITAMIN WITH MINERALS) TABS Take 1 tablet by mouth daily.    [provider]  nicotine  (NICODERM CQ  - DOSED IN MG/24 HOURS) 21 mg/24hr patch Place 1 patch (21 mg total) onto the skin daily. 10/30/23   Arrien, Mauricio Daniel, MD  nitroGLYCERIN  (NITROSTAT ) 0.4 MG SL tablet Place 1 tablet (0.4 mg total) under the tongue every 5 (five) minutes x 3 doses as needed for chest pain (if no relief after 2nd dose, proceed to ED for an evaluation). 09/09/23   Alvan Dorn FALCON, MD  Omega-3 Fatty Acids (OMEGA 3 FISH OIL PO) Take 1 capsule by mouth daily.    [provider]  pantoprazole  (PROTONIX ) 40 MG tablet Take 1 tablet (40 mg total) by mouth 2 (two) times daily. 04/27/23   Carlan, Chelsea L, NP  varenicline  (CHANTIX  CONTINUING MONTH PAK) 1 MG tablet Take 1 tablet (1 mg total) by mouth 2 (two) times daily. Set quit date Patient not taking: Reported on 11/30/2023 11/25/23    Aletha Bene, MD    Allergies  Allergen Reactions   Morphine And Codeine Nausea And Vomiting    REVIEW OF SYSTEMS:  General: no fevers/chills/night sweats Eyes: no blurry vision, diplopia, or amaurosis ENT: no sore throat or hearing loss Resp: no cough, wheezing, or hemoptysis CV: no edema or palpitations GI: no abdominal pain, nausea, vomiting, diarrhea, or constipation GU: no dysuria, frequency, or hematuria Skin: no rash Neuro: no headache, numbness, tingling, or weakness of extremities Musculoskeletal: no joint pain or swelling Heme: no bleeding, DVT, or easy bruising Endo: no polydipsia or polyuria  BP 104/82   Pulse 92   Ht 5' 7 (1.702 m)   Wt 175 lb 6.4 oz (79.6 kg)   SpO2 94%   BMI 27.47 kg/m   PHYSICAL EXAM: GEN:  AO x 3 in no acute distress HEENT: normal Dentition: Normal Neck: JVP normal. +2 carotid upstrokes without bruits. No thyromegaly. Lungs: equal expansion, clear bilaterally CV: Apex is discrete and nondisplaced, RRR with 3/6 SEM Abd: soft, non-tender, non-distended; no bruit; positive bowel sounds Ext: no edema, ecchymoses, or cyanosis Vascular: 2+ femoral pulses, 2+ radial pulses       Skin: warm and dry without rash Neuro: CN II-XII grossly intact; motor and sensory grossly intact    DATA AND STUDIES:  EKG: 2025 atrial fibrillation with no bundle-branch blocks  EKG Interpretation Date/Time:    Ventricular Rate:    PR Interval:    QRS Duration:    QT Interval:    QTC Calculation:   R Axis:      Text Interpretation:          CARDIAC STUDIES: Refer to CV Procedures and Imaging Tabs  10/27/2023: ALT 16; TSH 1.615 11/02/2023: BUN 12; Creatinine, Ser 0.61; Hemoglobin 14.4; Magnesium  1.5; Platelets 248.0; Potassium 4.2; Sodium 138   STS RISK CALCULATOR: Pending  NYHA CLASS: 1/2    ASSESSMENT AND PLAN:   1. Nonrheumatic aortic valve stenosis   2. Chronic  diastolic heart failure (HCC)   3. ASD (atrial septal defect)   4.  Paroxysmal atrial fibrillation (HCC)   5. Secondary hypercoagulable state   6. Type 2 diabetes mellitus without complication, without long-term current use of insulin  (HCC)   7. Hyperlipidemia associated with type 2 diabetes mellitus (HCC)   8. Aortic atherosclerosis   9. Hypertension associated with diabetes (HCC)   10. Cerebrovascular accident (CVA), unspecified mechanism (HCC)   11. BMI 29.0-29.9,adult     Aortic stenosis: The patient has developed paradoxical low-flow low gradient aortic stenosis.  He is not having much by way of symptoms.  We had a long discussion about potential treatment options including watchful waiting versus proceeding with an aortic valve intervention.  The patient would like to monitor his symptoms because he again does not feel badly.  If and when he develops symptoms in an aortic valve intervention is to be pursued this will need to be informed by how his ASD will be managed.  We will need a TEE to determine whether his ASD is suitable for transcatheter closure.  The options include SAVR plus surgical ASD closure, TAVR plus percutaneous ASD closure, or TAVR plus surgical ASD closure if closure is not deemed feasible by percutaneous techniques.  I will see the patient back in 3 months with another echocardiogram.  He will reach out to us  if he develops increasing shortness of breath, presyncope, exertional chest pain or syncope. Chronic diastolic heart failure: Continue lisinopril  20 mg, Toprol  50 mg, Lasix  40 mg as needed; defer Jardiance  due to urinary issues ASD: Will need a TEE once an evaluation for his aortic valve is initiated. PAF: Continue Eliquis  5mg  twice daily, Toprol  50 mg daily Secondary hypercoagulable state: Continue Eliquis  5 mg twice daily T2DM: Continue Eliquis  5 mg twice daily, lisinopril  20 mg, atorvastatin  80 mg, defer Jardiance  due to urinary issues Hyperlipidemia: Continue atorvastatin  80 mg  Aortic atherosclerosis: Continue Eliquis  5 mg twice  daily, atorvastatin  80 mg  Hypertension: Continue lisinopril  20 mg, Toprol  50 mg.  BP well controlled today. History of stroke: Continue Eliquis  5 mg twice daily, atorvastatin  80 mg.  Was due to noncompliance with anticoagulant therapy. Elevated BMI: Consider GLP-1 receptor agonist therapy.   I have personally reviewed the patients imaging data as summarized above.  I have reviewed the natural history of aortic stenosis with the patient and family members who are present today. We have discussed the limitations of medical therapy and the poor prognosis associated with symptomatic aortic stenosis. We have also reviewed potential treatment options, including palliative medical therapy, conventional surgical aortic valve replacement, and transcatheter aortic valve replacement. We discussed treatment options in the context of this patient's specific comorbid medical conditions.   All of the patient's questions were answered today. Will make further recommendations based on the results of studies outlined above.   I spent 51 minutes reviewing all clinical data during and prior to this visit including all relevant imaging studies, laboratories, clinical information from other health systems and prior notes from both Cardiology and other specialties, interviewing the patient, conducting a complete physical examination, and coordinating care in order to formulate a comprehensive and personalized evaluation and treatment plan.   Peggi Yono K Vallarie Fei, MD  12/14/2023 12:04 PM    [facility] [address] [place], [place], [place]  [address] Phone: [phone]; Fax: [phone]

## 2023-12-14 ENCOUNTER — Ambulatory Visit: Attending: Internal Medicine | Admitting: Internal Medicine

## 2023-12-14 ENCOUNTER — Encounter: Payer: Self-pay | Admitting: Internal Medicine

## 2023-12-14 ENCOUNTER — Encounter: Payer: Self-pay | Admitting: Neurology

## 2023-12-14 ENCOUNTER — Ambulatory Visit: Admitting: Neurology

## 2023-12-14 VITALS — BP 124/72 | HR 97 | Ht 67.0 in | Wt 178.0 lb

## 2023-12-14 VITALS — BP 104/82 | HR 92 | Ht 67.0 in | Wt 175.4 lb

## 2023-12-14 DIAGNOSIS — E1169 Type 2 diabetes mellitus with other specified complication: Secondary | ICD-10-CM

## 2023-12-14 DIAGNOSIS — E1159 Type 2 diabetes mellitus with other circulatory complications: Secondary | ICD-10-CM

## 2023-12-14 DIAGNOSIS — I5032 Chronic diastolic (congestive) heart failure: Secondary | ICD-10-CM | POA: Diagnosis not present

## 2023-12-14 DIAGNOSIS — I480 Paroxysmal atrial fibrillation: Secondary | ICD-10-CM

## 2023-12-14 DIAGNOSIS — Z6829 Body mass index (BMI) 29.0-29.9, adult: Secondary | ICD-10-CM

## 2023-12-14 DIAGNOSIS — I639 Cerebral infarction, unspecified: Secondary | ICD-10-CM

## 2023-12-14 DIAGNOSIS — D6869 Other thrombophilia: Secondary | ICD-10-CM

## 2023-12-14 DIAGNOSIS — Q211 Atrial septal defect, unspecified: Secondary | ICD-10-CM

## 2023-12-14 DIAGNOSIS — E119 Type 2 diabetes mellitus without complications: Secondary | ICD-10-CM

## 2023-12-14 DIAGNOSIS — E785 Hyperlipidemia, unspecified: Secondary | ICD-10-CM

## 2023-12-14 DIAGNOSIS — I350 Nonrheumatic aortic (valve) stenosis: Secondary | ICD-10-CM | POA: Diagnosis not present

## 2023-12-14 DIAGNOSIS — I152 Hypertension secondary to endocrine disorders: Secondary | ICD-10-CM

## 2023-12-14 DIAGNOSIS — I700 Atherosclerosis of aorta: Secondary | ICD-10-CM

## 2023-12-14 NOTE — Progress Notes (Signed)
 [facility]  PATIENT: Jonathan Gilmore DOB: [birth year]  REQUESTING CLINICIAN: Levora Reyes SAUNDERS, MD HISTORY FROM: Patient/Spouse and chart review  REASON FOR VISIT: Cerebellar stroke    HISTORICAL  CHIEF COMPLAINT:  Chief Complaint  Patient presents with   New Patient (Initial Visit)    Rm 12, NP, wife Jenkins, Stroke follow up    HISTORY OF PRESENT ILLNESS:  Discussed the use of AI scribe software for clinical note transcription with the patient, who gave verbal consent to proceed.  Jonathan Gilmore is a 73 year old male with atrial fibrillation on Eliquis , hypertension, hyperlipidemia, diabetes, current nicotine  use who presents with balance issues following a cerebellar stroke.  He reports that he had a cerebellar stroke in the right cerebellum after discontinuing Eliquis , and was told this was due to a blood clot. Initial symptoms included unsteadiness, a sensation of being 'like on a boat', and vomiting. He was hospitalized from Thursday to Sunday and initially required a wheelchair and walker. He has since progressed to using a cane, which he did not use prior to the stroke.  He attended inpatient rehabilitation briefly and is currently undergoing outpatient rehabilitation. He reports significant improvement, having graduated from a walker to a cane, and notes that he can go a whole day without using the cane. He has not experienced any falls recently, although he did fall once shortly after returning home from the hospital due to balance issues.  He continues to take Eliquis  to prevent further strokes. He also takes Lipitor  for cholesterol and Glucophage  (metformin ) for diabetes, with a recent A1c of 7.1.  He smokes cigarettes and marijuana, using marijuana at night to aid sleep. He has tried Chantix , nicotine  patches, and gum to quit smoking but has not found them effective.  No weakness or clumsiness in his right hand but mentions issues with his eye. He has a  sore foot, which he attributes to his need for a cane. No recent falls since the initial incident post-hospitalization.    Hospital Course and Summary: Mr. Beckley was admitted to the hospital with the working diagnosis of acute cerebrovascular accident.    73 yo male with the past medical history of hypertension, hyperlipidemia, T2DM, paroxysmal atrial fibrillation who presented with vertigo.    Acute onset of symptoms, while working in is backyard. Severe vertigo and sensation of legs giving up. Apparently has not been taking apixaban  for over one week. EMS was called he was found in atrial fibrillation with RVR and was transported to the ED. On his initial physical examination his blood pressure was 138/69, HR 83, RR 23 and 02 saturation 97%, lungs with no wheezing or rhonchi, heart with S1 and S2 present and tachycardic, systolic murmur at the base, abdomen with no distention and no lower extremity edema. Neurologically he was non focal.    Na 140, K 3.1 Cl 100 bicarbonate 22 glucose 178, bun 10 cr 0,78  Mg 1,5  AST 26 ALT 16  Ck 54 High sensitive troponin 5 and 7  Wbc 10,4 hgb 14.1 plt 209  TSH 1,61 Hgb A1c 7.1    Chest radiograph with hypoinflation, positive cardiomegaly, calcified aortic notch, no infiltrates or effusions.    EKG 79 bpm, normal axis, normal intervals, qtc 425, sinus rhythm with poor RR wave progression, ST depression and T wave inversion in lead II, III, aVF, V 4 to V6   Brain MRI MRA with new area of restricted diffusion medially within the right cerebellar  hemisphere with mild cerebral edema and partial effacement of the fourth ventricle.  Mild periventricular and deep cerebral white matter disease.    CT angiography with acute right posterior inferior cerebellar artery infarct with diffusion hyperintensity in the distal medial cerebellum and slight mass effect on the fourth ventricle.  Complete occlusion of the V1 and V2 segments of the right vertebral artery with  patent V3 segment.  Moderate to severe multifocal stenosis bilaterally in the vertebral arteries.  Abrupt occlusion or narrowing of the distal P2 segment of the right posterior cerebral artery.  Moderate calcific atheromatous disease in the aortic arch ad carotid arteries with mild to moderate stenosis in the right internal carotid artery and moderate stenosis in the left internal carotid artery.    Patient was resumed on anticoagulation with apixaban  and had frequent neuro checks.  PT, OT and neurology consultation.    Initially deemed candidate for CIR.  08/16 improved mobility with PT  08/17 patient going home with home health services.  OTHER MEDICAL CONDITIONS: Atrial fibrillation, hypertension, diabetes, hyperlipidemia, nicotine  use   REVIEW OF SYSTEMS: Full 14 system review of systems performed and negative with exception of: As noted in the HPI  ALLERGIES: Allergies  Allergen Reactions   Morphine And Codeine Nausea And Vomiting    HOME MEDICATIONS: Outpatient Medications Prior to Visit  Medication Sig Dispense Refill   ACCU-CHEK AVIVA PLUS test strip USE AS INSTRUCTED 100 strip 3   apixaban  (ELIQUIS ) 5 MG TABS tablet Take 1 tablet (5 mg total) by mouth 2 (two) times daily. 60 tablet 2   atorvastatin  (LIPITOR ) 80 MG tablet Take 1 tablet (80 mg total) by mouth daily. 90 tablet 3   Calcium -Magnesium -Zinc (CAL-MAG-ZINC PO) Take 1 tablet by mouth daily.     furosemide  (LASIX ) 40 MG tablet TAKE 1 TABLET BY MOUTH DAILY AS NEEDED FOR EDEMA(SWELLING) 30 tablet 5   gabapentin  (NEURONTIN ) 300 MG capsule TAKE 1 TO 2 CAPSULES BY MOUTH  TWICE DAILY 360 capsule 3   lisinopril  (ZESTRIL ) 20 MG tablet Take 1 tablet (20 mg total) by mouth daily. 90 tablet 1   metFORMIN  (GLUCOPHAGE ) 500 MG tablet Take 2 tablets (1,000 mg total) by mouth 2 (two) times daily with a meal. 360 tablet 1   metoprolol  succinate (TOPROL -XL) 50 MG 24 hr tablet TAKE 1 TABLET BY MOUTH DAILY 90 tablet 3   Multiple Vitamin  (MULTIVITAMIN WITH MINERALS) TABS Take 1 tablet by mouth daily.     nicotine  (NICODERM CQ  - DOSED IN MG/24 HOURS) 21 mg/24hr patch Place 1 patch (21 mg total) onto the skin daily. 28 patch 0   nitroGLYCERIN  (NITROSTAT ) 0.4 MG SL tablet Place 1 tablet (0.4 mg total) under the tongue every 5 (five) minutes x 3 doses as needed for chest pain (if no relief after 2nd dose, proceed to ED for an evaluation). 25 tablet 3   Omega-3 Fatty Acids (OMEGA 3 FISH OIL PO) Take 1 capsule by mouth daily.     pantoprazole  (PROTONIX ) 40 MG tablet Take 1 tablet (40 mg total) by mouth 2 (two) times daily. 180 tablet 3   varenicline  (CHANTIX  CONTINUING MONTH PAK) 1 MG tablet Take 1 tablet (1 mg total) by mouth 2 (two) times daily. Set quit date 60 tablet 2   No facility-administered medications prior to visit.    PAST MEDICAL HISTORY: Past Medical History:  Diagnosis Date   Abnormal cardiovascular stress test 03/2013   Anxiety    Arthritis    Back pain  Cancer [facility])    Prostate cancer-no treatment per pt   CHF (congestive heart failure) (HCC)    Coronary artery disease    Multivessel with significant LAD involvement by chest CT 2014   Depression    Diastolic dysfunction    Essential hypertension    Lumbar herniated disc    L4-L5   MRSA (methicillin resistant staph aureus) culture positive    Myocardial infarction (HCC)    Neuropathy    Shortness of breath dyspnea    Sleep apnea    No cpap   Stroke (HCC) 10/24/2023   Type 2 diabetes mellitus (HCC)     PAST SURGICAL HISTORY: Past Surgical History:  Procedure Laterality Date   ANTERIOR CERVICAL DECOMP/DISCECTOMY FUSION  01/18/2011   Procedure: ANTERIOR CERVICAL DECOMPRESSION/DISCECTOMY FUSION 2 LEVELS;  Surgeon: Victory LABOR Pool;  Location: [facility];  Service: Neurosurgery;  Laterality: N/A;  anterior cervical discectomy and fusion with allograft and plating, cervical five-six, cervical six-seven   BIOPSY N/A 04/04/2015   Procedure: BIOPSY;   Surgeon: Claudis RAYMOND Rivet, MD;  Location: [facility];  Service: Endoscopy;  Laterality: N/A;   BIOPSY  11/09/2019   Procedure: BIOPSY;  Surgeon: Eartha Angelia Sieving, MD;  Location: [facility];  Service: Gastroenterology;;   Bone spur removed  1997   Left shoulder   CARDIAC CATHETERIZATION N/A 08/08/2015   Procedure: Right/Left Heart Cath and Coronary Angiography;  Surgeon: Ozell Fell, MD;  Location: [facility] INVASIVE CV LAB;  Service: Cardiovascular;  Laterality: N/A;   CARPAL TUNNEL RELEASE Right    COLONOSCOPY WITH PROPOFOL  N/A 11/09/2019   Procedure: COLONOSCOPY WITH PROPOFOL ;  Surgeon: Eartha Angelia Sieving, MD;  [facility]: [facility];  Service: Gastroenterology;  Laterality: N/A;  900   COLONOSCOPY WITH PROPOFOL  N/A 02/27/2021   Procedure: COLONOSCOPY WITH PROPOFOL ;  Surgeon: Eartha Angelia Sieving, MD;  [facility];  Service: Gastroenterology;  Laterality: N/A;  7:30   COLONOSCOPY WITH PROPOFOL  N/A 04/05/2022   Procedure: COLONOSCOPY WITH PROPOFOL ;  Surgeon: Shila Gustav GAILS, MD;  Location: [facility];  Service: Gastroenterology;  Laterality: N/A;   ENTEROSCOPY N/A 06/30/2022   Procedure: ENTEROSCOPY;  Surgeon: Eartha Angelia Sieving, MD;  [facility]: [facility];  Service: Gastroenterology;  Laterality: N/A;  2:00pm;ASA 3   ESOPHAGEAL DILATION N/A 04/04/2015   Procedure: ESOPHAGEAL DILATION;  Surgeon: Claudis RAYMOND Rivet, MD;  [facility];  Service: Endoscopy;  Laterality: N/A;   ESOPHAGEAL DILATION N/A 11/09/2019   Procedure: ESOPHAGEAL DILATION;  Surgeon: Eartha Angelia Sieving, MD;  [facility];  Service: Gastroenterology;  Laterality: N/A;   ESOPHAGOGASTRODUODENOSCOPY N/A 04/04/2015   Procedure: ESOPHAGOGASTRODUODENOSCOPY (EGD);  Surgeon: Claudis RAYMOND Rivet, MD;  [facility];  Service: Endoscopy;  Laterality: N/A;  1240   ESOPHAGOGASTRODUODENOSCOPY (EGD) WITH PROPOFOL  N/A 11/09/2019   Procedure: ESOPHAGOGASTRODUODENOSCOPY  (EGD) WITH PROPOFOL ;  Surgeon: Eartha Angelia Sieving, MD;  [facility];  Service: Gastroenterology;  Laterality: N/A;   ESOPHAGOGASTRODUODENOSCOPY (EGD) WITH PROPOFOL  N/A 04/04/2022   Procedure: ESOPHAGOGASTRODUODENOSCOPY (EGD) WITH PROPOFOL ;  Surgeon: Leigh Elspeth SQUIBB, MD;  Location[facility] ENDOSCOPY;  Service: Gastroenterology;  Laterality: N/A;   ESOPHAGOGASTRODUODENOSCOPY (EGD) WITH PROPOFOL   06/30/2022   Procedure: ESOPHAGOGASTRODUODENOSCOPY (EGD) WITH PROPOFOL ;  Surgeon: Eartha Angelia Sieving, MD;  [facility];  Service: Gastroenterology;;   GIVENS CAPSULE STUDY N/A 05/11/2022   Procedure: GIVENS CAPSULE STUDY;  Surgeon: Eartha Angelia Sieving, MD;  [facility];  Service: Gastroenterology;  Laterality: N/A;  7:30 am  HEMORRHOID SURGERY     HOT HEMOSTASIS  06/30/2022   Procedure: HOT HEMOSTASIS (ARGON PLASMA COAGULATION/BICAP);  Surgeon: Eartha Flavors, Toribio, MD;  [facility];  Service: Gastroenterology;;   POLYPECTOMY  11/09/2019   Procedure: POLYPECTOMY;  Surgeon: Eartha Flavors, Toribio, MD;  [facility];  Service: Gastroenterology;;   POLYPECTOMY  02/27/2021   Procedure: POLYPECTOMY;  Surgeon: Eartha Flavors, Toribio, MD;  [facility];  Service: Gastroenterology;;   POLYPECTOMY  04/05/2022   Procedure: POLYPECTOMY;  Surgeon: Shila Gustav GAILS, MD;  Location[facility];  Service: Gastroenterology;;   TONSILLECTOMY      FAMILY HISTORY: Family History  Problem Relation Age of Onset   Lung cancer Mother    Diabetes Father    Hepatitis B Father    Diabetes Sister    Cushing syndrome Sister     SOCIAL HISTORY: Social History   Socioeconomic History   Marital status: Married    Spouse name: Not on file   Number of children: Not on file   Years of education: Not on file   Highest education level: Not on file  Occupational History   Not on file  Tobacco Use   Smoking status: Every Day     Current packs/day: 1.00    Average packs/day: 1 pack/day for 56.0 years (56.0 ttl pk-yrs)    Types: Cigarettes    Start date: 11/26/1967   Smokeless tobacco: Former   Tobacco comments:    smoking x 50 yrs  Vaping Use   Vaping status: Never Used  Substance and Sexual Activity   Alcohol use: Not Currently    Comment: rare   Drug use: Yes    Frequency: 5.0 times per week    Types: Marijuana    Comment: as needed   Sexual activity: Yes  Other Topics Concern   Not on file  Social History Narrative   Lives with wife   Right Handed   Drinks 2-3 cups caffeine daily   Social Drivers of Health   Financial Resource Strain: Low Risk  (11/17/2022)   Overall Financial Resource Strain (CARDIA)    Difficulty of Paying Living Expenses: Not hard at all  Food Insecurity: No Food Insecurity (10/31/2023)   Hunger Vital Sign    Worried About Running Out of Food in the Last Year: Never true    Ran Out of Food in the Last Year: Never true  Transportation Needs: No Transportation Needs (10/31/2023)   PRAPARE - Administrator, Civil Service (Medical): No    Lack of Transportation (Non-Medical): No  Physical Activity: Inactive (11/17/2022)   Exercise Vital Sign    Days of Exercise per Week: 0 days    Minutes of Exercise per Session: 0 min  Stress: No Stress Concern Present (11/17/2022)   [employer] of Occupational Health - Occupational Stress Questionnaire    Feeling of Stress : Not at all  Social Connections: Unknown (10/27/2023)   Social Connection and Isolation Panel    Frequency of Communication with Friends and Family: More than three times a week    Frequency of Social Gatherings with Friends and Family: More than three times a week    Attends Religious Services: Patient declined    Active Member of Clubs or Organizations: Patient declined    Attends Banker Meetings: Patient declined    Marital Status: Married  Catering manager Violence: Not At Risk  (10/31/2023)   Humiliation, Afraid, Rape, and Kick questionnaire  Fear of Current or Ex-Partner: No    Emotionally Abused: No    Physically Abused: No    Sexually Abused: No    PHYSICAL EXAM  GENERAL EXAM/CONSTITUTIONAL: Vitals:  Vitals:   12/14/23 1408  BP: 124/72  Pulse: 97  Weight: 178 lb (80.7 kg)  Height: 5' 7 (1.702 m)   Body mass index is 27.88 kg/m. Wt Readings from Last 3 Encounters:  12/14/23 178 lb (80.7 kg)  12/14/23 175 lb 6.4 oz (79.6 kg)  11/30/23 174 lb (78.9 kg)   Patient is in no distress; well developed, nourished and groomed; neck is supple  MUSCULOSKELETAL: Gait, strength, tone, movements noted in Neurologic exam below  NEUROLOGIC: MENTAL STATUS:     11/03/2020    1:15 PM  MMSE - Mini Mental State Exam  Orientation to time 5  Orientation to Place 5  Registration 3  Attention/ Calculation 5  Recall 3  Language- name 2 objects 2  Language- repeat 0  Language- follow 3 step command 3  Language- read & follow direction 1  Write a sentence 0  Copy design 0  Total score 27   awake, alert, oriented to person, place and time recent and remote memory intact normal attention and concentration language fluent, comprehension intact, naming intact fund of knowledge appropriate  CRANIAL NERVE:  2nd, 3rd, 4th, 6th - Visual fields full to confrontation, extraocular muscles intact, no nystagmus 5th - facial sensation symmetric 7th - facial strength symmetric 8th - hearing intact 9th - palate elevates symmetrically, uvula midline 11th - shoulder shrug symmetric 12th - tongue protrusion midline  MOTOR:  normal bulk and tone, full strength in the BUE, BLE  SENSORY:  normal and symmetric to light touch  COORDINATION:  finger-nose-finger, fine finger movements normal, no incoordination    GAIT/STATION:  Walks unassisted but has an antalgic gait due to left foot bone spur    DIAGNOSTIC DATA (LABS, IMAGING, TESTING) - I reviewed patient  records, labs, notes, testing and imaging myself where available.  Lab Results  Component Value Date   WBC 10.4 11/02/2023   HGB 14.4 11/02/2023   HCT 43.0 11/02/2023   MCV 94.5 11/02/2023   PLT 248.0 11/02/2023      Component Value Date/Time   NA 138 11/02/2023 1223   K 4.2 11/02/2023 1223   CL 97 11/02/2023 1223   CO2 32 11/02/2023 1223   GLUCOSE 210 (H) 11/02/2023 1223   BUN 12 11/02/2023 1223   CREATININE 0.61 11/02/2023 1223   CREATININE 0.70 08/13/2020 0859   CALCIUM  9.0 11/02/2023 1223   PROT 7.0 10/27/2023 1513   ALBUMIN 3.8 10/27/2023 1513   AST 26 10/27/2023 1513   ALT 16 10/27/2023 1513   ALKPHOS 109 10/27/2023 1513   BILITOT 1.7 (H) 10/27/2023 1513   GFRNONAA >60 10/30/2023 0645   GFRNONAA 97 05/06/2020 1325   GFRAA 113 05/06/2020 1325   Lab Results  Component Value Date   CHOL 112 10/28/2023   HDL 34 (L) 10/28/2023   LDLCALC 66 10/28/2023   TRIG 60 10/28/2023   CHOLHDL 3.3 10/28/2023   Lab Results  Component Value Date   HGBA1C 7.1 (H) 10/27/2023   Lab Results  Component Value Date   VITAMINB12 512 07/11/2023   Lab Results  Component Value Date   TSH 1.615 10/27/2023    MRI brain 10/27/2023 1. New area of restricted diffusion medially within the right cerebellar hemisphere with mild cerebellar swelling and partial effacement of the fourth ventricle, consistent  with Nonhemorrhagic acute infarct. 2. Mild periventricular and deep cerebral white matter disease  CTA Head and Neck 10/27/2023 1. Acute right posterior inferior cerebellar artery infarct with diffusion hyperintensity in the distal medial cerebellum and slight mass effect on the fourth ventricle, as seen on prior MRI. Not readily apparent on current CT. 2. Complete occlusion of the V1 and V2 segments of the right vertebral artery with patent V3 segment likely containing retrograde blood flow. Moderate-to-severe multifocal stenosis bilaterally in the vertebral arteries. 3. Abrupt narrowing or  occlusion of the distal P2 segment of the right posterior cerebral artery. 4. Moderate calcific atheromatous disease in the aortic arch and carotid arteries with mild-to-moderate stenosis in the right internal carotid artery and moderate stenosis in the left internal carotid artery.   ASSESSMENT AND PLAN  73 y.o. year old male with vascular risk factors including atrial fibrillation, hypertension, hyperlipidemia, diabetes, nicotine  use who is presenting after a right cerebellar stroke. Stroke etiology likely cardioembolic from nonadherence to Eliquis  with the atrial fibrillation.  He now resumed Eliquis  and has recovered well from a stroke perspective  Right cerebellar stroke Right cerebellar stroke with initial symptoms of unsteadiness and balance issues, likely cardioembolic from atrial fibrillation. MRI confirmed a significant lesion in the right cerebellum. Improvement in balance noted, progressing from a walker to a cane, with no recent falls. Stroke attributed to discontinuation of Eliquis , leading to clot formation due to atrial fibrillation. Clot resolved, but brain requires time for recalibration of balance and gait. - Continue outpatient rehabilitation to improve balance and gait. - Educate on the importance of lifelong Eliquis  use to prevent further strokes.  Atrial fibrillation Atrial fibrillation is a significant risk factor for stroke. Discontinuation of Eliquis  led to the stroke. He has resumed Eliquis  to mitigate this risk. - Continue Eliquis  indefinitely to prevent clot formation.  Hypertension Hypertension is a risk factor for stroke, particularly for cerebral hemorrhage and aneurysm formation. Blood pressure management is crucial to prevent further vascular complications.  Type 2 diabetes mellitus Type 2 diabetes mellitus with a recent A1c of 7.1, above the target of below 7. Diabetes is a risk factor for stroke, affecting small and large blood vessels. - Continue monitoring  and managing blood glucose levels to achieve an A1c below 7.  Hyperlipidemia Hyperlipidemia is being managed with Lipitor . LDL 66 - Continue Lipitor  to manage cholesterol levels.  Tobacco use disorder Continues to smoke despite attempts with Chantix , nicotine  patches, and gums. Smoking is the most significant modifiable risk factor for stroke, affecting both small and large blood vessels. The risk of another stroke remains high while he continues to smoke. - Discuss alternative smoking cessation strategies, as current methods have been ineffective.    1. Cerebellar stroke [facility])      Patient Instructions  Continue current medications including Eliquis , Lipitor  and Zestril   Discussed smoking cessation  Continue to follow up with PCP Return as needed   No orders of the defined types were placed in this encounter.   No orders of the defined types were placed in this encounter.   Return if symptoms worsen or fail to improve.  I personally spent a total of 65 minutes in the care of the patient today including preparing to see the patient, getting/reviewing separately obtained history, performing a medically appropriate exam/evaluation, counseling and educating, documenting clinical information in the EHR, and spent additional time discussed his nicotine  use, risk factors for additional stroke and need for smoking cessation.   Pastor Falling, MD 12/14/2023,  4:30 PM  [facility] [address] [place], [place] [address] [phone]

## 2023-12-14 NOTE — Patient Instructions (Signed)
 Medication Instructions:  No changes  *If you need a refill on your cardiac medications before your next appointment, please call your pharmacy*   Lab Work: Not needed     Testing/Procedures: Schedule in Jan 2026 Your physician has requested that you have an echocardiogram. Echocardiography is a painless test that uses sound waves to create images of your heart. It provides your doctor with information about the size and shape of your heart and how well your heart's chambers and valves are working. This procedure takes approximately one hour. There are no restrictions for this procedure. Please do NOT wear cologne, perfume, aftershave, or lotions (deodorant is allowed). Please arrive 15 minutes prior to your appointment time.  Please note: We ask at that you not bring children with you during ultrasound (echo/ vascular) testing. Due to room size and safety concerns, children are not allowed in the ultrasound rooms during exams. Our front office staff cannot provide observation of children in our lobby area while testing is being conducted. An adult accompanying a patient to their appointment will only be allowed in the ultrasound room at the discretion of the ultrasound technician under special circumstances. We apologize for any inconvenience.  Follow-Up: At [facility], you and your health needs are our priority.  As part of our continuing mission to provide you with exceptional heart care, we have created designated Provider Care Teams.  These Care Teams include your primary Cardiologist (physician) and Advanced Practice Providers (APPs -  Physician Assistants and Nurse Practitioners) who all work together to provide you with the care you need, when you need it.     Your next appointment:   3 month(s) after echo is completed  The format for your next appointment:   In Person  Provider:   Dr Lurena Red

## 2023-12-14 NOTE — Patient Instructions (Signed)
 Continue current medications including Eliquis , Lipitor  and Zestril   Discussed smoking cessation  Continue to follow up with PCP Return as needed

## 2023-12-15 ENCOUNTER — Ambulatory Visit (HOSPITAL_COMMUNITY): Attending: Orthopedic Surgery

## 2023-12-15 ENCOUNTER — Encounter (HOSPITAL_COMMUNITY): Payer: Self-pay

## 2023-12-15 DIAGNOSIS — Z9181 History of falling: Secondary | ICD-10-CM | POA: Insufficient documentation

## 2023-12-15 DIAGNOSIS — R262 Difficulty in walking, not elsewhere classified: Secondary | ICD-10-CM | POA: Diagnosis present

## 2023-12-15 DIAGNOSIS — Z7409 Other reduced mobility: Secondary | ICD-10-CM | POA: Insufficient documentation

## 2023-12-15 NOTE — Therapy (Signed)
 OUTPATIENT PHYSICAL THERAPY NEURO TREATMENT   Patient Name: Jonathan Gilmore MRN: [id number] DOB:[birth year], 73 y.o., male Today's Date: 12/15/2023     END OF SESSION:  PT End of Session - 12/15/23 0729     Visit Number 8    Number of Visits 12    Date for Recertification  12/27/23    Authorization Type [employer] MEDICARE    Authorization Time Period uhc approved 12 visits from 11/15/23-12/27/23 [phone]    Authorization - Visit Number 8    Authorization - Number of Visits 12    Progress Note Due on Visit 10    PT Start Time 0730    PT Stop Time 0808    PT Time Calculation (min) 38 min    Activity Tolerance Patient tolerated treatment well;Patient limited by pain    Behavior During Therapy [facility] for tasks assessed/performed             Past Medical History:  Diagnosis Date   Abnormal cardiovascular stress test 03/2013   Anxiety    Arthritis    Back pain    Cancer (HCC)    Prostate cancer-no treatment per pt   CHF (congestive heart failure) (HCC)    Coronary artery disease    Multivessel with significant LAD involvement by chest CT 2014   Depression    Diastolic dysfunction    Essential hypertension    Lumbar herniated disc    L4-L5   MRSA (methicillin resistant staph aureus) culture positive    Myocardial infarction (HCC)    Neuropathy    Shortness of breath dyspnea    Sleep apnea    No cpap   Stroke (HCC) 10/24/2023   Type 2 diabetes mellitus (HCC)    Past Surgical History:  Procedure Laterality Date   ANTERIOR CERVICAL DECOMP/DISCECTOMY FUSION  01/18/2011   Procedure: ANTERIOR CERVICAL DECOMPRESSION/DISCECTOMY FUSION 2 LEVELS;  Surgeon: Victory LABOR Pool;  Location: [facility];  Service: Neurosurgery;  Laterality: N/A;  anterior cervical discectomy and fusion with allograft and plating, cervical five-six, cervical six-seven   BIOPSY N/A 04/04/2015   Procedure: BIOPSY;  Surgeon: Claudis RAYMOND Rivet, MD;  Location: [facility];  Service: Endoscopy;  Laterality: N/A;    BIOPSY  11/09/2019   Procedure: BIOPSY;  Surgeon: Eartha Angelia Sieving, MD;  Location: [facility];  Service: Gastroenterology;;   Bone spur removed  1997   Left shoulder   CARDIAC CATHETERIZATION N/A 08/08/2015   Procedure: Right/Left Heart Cath and Coronary Angiography;  Surgeon: Ozell Fell, MD;  Location: [facility] INVASIVE CV LAB;  Service: Cardiovascular;  Laterality: N/A;   CARPAL TUNNEL RELEASE Right    COLONOSCOPY WITH PROPOFOL  N/A 11/09/2019   Procedure: COLONOSCOPY WITH PROPOFOL ;  Surgeon: Eartha Angelia Sieving, MD;  [facility]: [facility];  Service: Gastroenterology;  Laterality: N/A;  900   COLONOSCOPY WITH PROPOFOL  N/A 02/27/2021   Procedure: COLONOSCOPY WITH PROPOFOL ;  Surgeon: Eartha Angelia Sieving, MD;  [facility]: [facility];  Service: Gastroenterology;  Laterality: N/A;  7:30   COLONOSCOPY WITH PROPOFOL  N/A 04/05/2022   Procedure: COLONOSCOPY WITH PROPOFOL ;  Surgeon: Shila Gustav GAILS, MD;  Location: [facility] ENDOS[facility];  Service: Gastroenterology;  Laterality: N/A;   ENTEROSCOPY N/A 06/30/2022   Procedure: ENTEROSCOPY;  Surgeon: Eartha Angelia Sieving, MD;  [facility]: [facility];  Service: Gastroenterology;  Laterality: N/A;  2:00pm;ASA 3   ESOPHAGEAL DILATION N/A 04/04/2015   Procedure: ESOPHAGEAL DILATION;  Surgeon: Claudis RAYMOND Rivet, MD;  [facility];  Service: Endoscopy;  Laterality: N/A;   ESOPHAGEAL DILATION N/A 11/09/2019   Procedure: ESOPHAGEAL DILATION;  Surgeon: Eartha Angelia Sieving, MD;  [facility];  Service: Gastroenterology;  Laterality: N/A;   ESOPHAGOGASTRODUODENOSCOPY N/A 04/04/2015   Procedure: ESOPHAGOGASTRODUODENOSCOPY (EGD);  Surgeon: Claudis RAYMOND Rivet, MD;  [facility];  Service: Endoscopy;  Laterality: N/A;  1240   ESOPHAGOGASTRODUODENOSCOPY (EGD) WITH PROPOFOL  N/A 11/09/2019   Procedure: ESOPHAGOGASTRODUODENOSCOPY (EGD) WITH PROPOFOL ;  Surgeon: Eartha Angelia Sieving, MD;  [facility];   Service: Gastroenterology;  Laterality: N/A;   ESOPHAGOGASTRODUODENOSCOPY (EGD) WITH PROPOFOL  N/A 04/04/2022   Procedure: ESOPHAGOGASTRODUODENOSCOPY (EGD) WITH PROPOFOL ;  Surgeon: Leigh Elspeth SQUIBB, MD;  Location[facility] ENDOSCOPY;  Service: Gastroenterology;  Laterality: N/A;   ESOPHAGOGASTRODUODENOSCOPY (EGD) WITH PROPOFOL   06/30/2022   Procedure: ESOPHAGOGASTRODUODENOSCOPY (EGD) WITH PROPOFOL ;  Surgeon: Eartha Angelia Sieving, MD;  [facility];  Service: Gastroenterology;;   GIVENS CAPSULE STUDY N/A 05/11/2022   Procedure: GIVENS CAPSULE STUDY;  Surgeon: Eartha Angelia Sieving, MD;  [facility];  Service: Gastroenterology;  Laterality: N/A;  7:30 am   HEMORRHOID SURGERY     HOT HEMOSTASIS  06/30/2022   Procedure: HOT HEMOSTASIS (ARGON PLASMA COAGULATION/BICAP);  Surgeon: Eartha Angelia, Sieving, MD;  [facility];  Service: Gastroenterology;;   POLYPECTOMY  11/09/2019   Procedure: POLYPECTOMY;  Surgeon: Eartha Angelia Sieving, MD;  [facility];  Service: Gastroenterology;;   POLYPECTOMY  02/27/2021   Procedure: POLYPECTOMY;  Surgeon: Eartha Angelia Sieving, MD;  [facility];  Service: Gastroenterology;;   POLYPECTOMY  04/05/2022   Procedure: POLYPECTOMY;  Surgeon: Shila Gustav GAILS, MD;  Location: [facility] ENDOSCOPY;  Service: Gastroenterology;;   TONSILLECTOMY     Patient Active Problem List   Diagnosis Date Noted   Acute CVA (cerebrovascular accident) (HCC) 10/28/2023   Paroxysmal atrial fibrillation (HCC) 10/28/2023   Hypokalemia 10/28/2023   Stroke (cerebrum) (HCC) 10/27/2023   Stroke (HCC) 10/27/2023   GERD (gastroesophageal reflux disease) 04/27/2023   Iron deficiency 04/08/2023   Belching 02/08/2023   Hx of fusion of cervical spine 11/22/2022   AVM (arteriovenous malformation) of small bowel, acquired 06/30/2022   Iron deficiency anemia due to chronic blood loss 04/22/2022   History of esophagitis 04/22/2022    Heme positive stool 04/04/2022   Iron deficiency anemia 04/04/2022   Prostate cancer (HCC) 06/08/2021   History of colonic polyps 02/09/2021   Intestinal metaplasia of antrum of stomach without dysplasia 02/09/2021   Insomnia 03/12/2019   Pure hypercholesterolemia 11/29/2018   Hypogonadism, male 11/23/2018   High risk medication use 10/28/2018   Carotid stenosis, asymptomatic, bilateral 10/28/2018   Tobacco use 10/28/2018   Polyp of colon 10/28/2018   Lumbar disc herniation with radiculopathy 12/21/2017   Spinal stenosis of lumbar region without neurogenic claudication 12/21/2017   CAD -50% LAD  08/09/2015   Moderate aortic stenosis 08/09/2015   Acute diastolic CHF (congestive heart failure) (HCC) 08/07/2015   Hypersomnia with sleep apnea 09/20/2013   Syncope 09/19/2013   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   OSA (obstructive sleep apnea) -non compliant 09/19/2013   Narcolepsy and cataplexy 09/19/2013   Facial abscess 04/28/2013   Facial cellulitis 04/28/2013   Mixed hyperlipidemia 04/28/2013   Tobacco dependence 04/28/2013   Sepsis (HCC) 04/28/2013   Gout 04/28/2013   Chronic diastolic CHF (congestive heart failure) (HCC) 04/28/2013   Pleuritic pain 02/24/2013   Murmur 02/24/2013   Pericarditis 02/24/2013   Type 2 diabetes mellitus with hyperlipidemia (HCC) 02/24/2013   Essential  hypertension 02/24/2013   Cervical spondylosis with myelopathy 01/19/2011   PCP: Levora Reyes SAUNDERS, MD  REFERRING PROVIDER: Noralee Elidia Sieving, MD ONSET DATE: 11/03/23  REFERRING DIAG: I63.9 (ICD-10-CM) - Acute CVA (cerebrovascular accident) (HCC)  THERAPY DIAG:  Difficulty in walking, not elsewhere classified  Impaired functional mobility, balance, and endurance  History of falling  Rationale for Evaluation and Treatment: Rehabilitation  SUBJECTIVE:                                                                                                                                                                                              SUBJECTIVE STATEMENT: Continues to have pain lateral aspect of Lt foot.  Has been walking a lot, no reports of recent falls.  Walks with cane to support Lt foot, has been walking some without cane for short distance.    (Initial) Pt states he lost balance when stroke occurred, was not using cane before the stroke happened. Pt states he was outiside weedeating when the stroke occurred and fell several times, was crawling around due to imbalance/dizziness. Pt denies any weakness or numbness related to the stroke, does have neuropahty due to diabetes. Pts wife states pt was leaning to the right side and right eye was drooping. Pt accompanied by: self and significant other  PERTINENT HISTORY:  Diabetes, neuropathy CHF Aortic Valve issue  PAIN:  Are you having pain? Yes: NPRS scale: 8/10 Pain location: lateral aspect Lt foot Pain description: aches, sharp pain with movement Aggravating factors: sleeping, moving Relieving factors: laying on back  PRECAUTIONS: Fall  RED FLAGS: None   WEIGHT BEARING RESTRICTIONS: No  FALLS: Has patient fallen in last 6 months? Yes. Number of falls 6-8  LIVING ENVIRONMENT: Lives with: lives with their spouse Lives in: House/apartment Stairs: Yes: External: 2 steps; on right going up Has following equipment at home: Single point cane and Walker - 2 wheeled  PLOF: Independent and Independent with basic ADLs  PATIENT GOALS: improve balance and walking with LRAD  OBJECTIVE:  Note: Objective measures were completed at Evaluation unless otherwise noted.  DIAGNOSTIC FINDINGS:  EXAM: MRI BRAIN WITHOUT CONTRAST 10/27/2023 04:34:27 PM   TECHNIQUE: Multiplanar multisequence MRI of the head/brain was performed without the administration of intravenous contrast.   COMPARISON: MRI of the brain dated 09/10/2000.   CLINICAL HISTORY: Syncope/presyncope, cerebrovascular cause suspected. Syncope;  Limited positioning, pt combative.   FINDINGS:   BRAIN AND VENTRICLES: There is a new area of restricted diffusion present medially within the right cerebellar hemisphere. There is mild cerebellar swelling with partial effacement of the fourth ventricle. There is mild periventricular and deep  cerebral white matter disease present.   ORBITS: No acute abnormality.   SINUSES AND MASTOIDS: Polypoidal mucosal disease present within the floors of the maxillary sinuses.   BONES AND SOFT TISSUES: Normal marrow signal. No acute soft tissue abnormality.   IMPRESSION: 1. New area of restricted diffusion medially within the right cerebellar hemisphere with mild cerebellar swelling and partial effacement of the fourth ventricle, consistent with Nonhemorrhagic acute infarct. 2. Mild periventricular and deep cerebral white matter disease.   Electronically signed by: evalene coho 10/27/2023 05:20 PM EDT RP Workstation: [id number]  COGNITION: Overall cognitive status: Pts significant other reports changes in cognition although pt denies any change   SENSATION: Light touch: Impaired  Toes  COORDINATION: Pt demonstrates abnormal finger to nose testing with LUE  DTRs:  Not assessed this date   LOWER EXTREMITY ROM:     Active  Right Eval Left Eval  Hip flexion    Hip extension    Hip abduction    Hip adduction    Hip internal rotation    Hip external rotation    Knee flexion    Knee extension    Ankle dorsiflexion    Ankle plantarflexion    Ankle inversion    Ankle eversion     (Blank rows = not tested)  LOWER EXTREMITY MMT:    MMT Right Eval Left Eval  Hip flexion 4 4  Hip extension    Hip abduction 4 4  Hip adduction 4 4  Hip internal rotation    Hip external rotation    Knee flexion 4 4  Knee extension 4 4  Ankle dorsiflexion 3+ 3+  Ankle plantarflexion    Ankle inversion    Ankle eversion    (Blank rows = not tested)    STAIRS: Findings: Stair  Negotiation Technique: Alternating Pattern  with Single Rail on Left, Number of Stairs: 4, Height of Stairs: 8 inches   , and Comments: pt demonstrates good LE strength with alternating pattern but increased time required GAIT: Findings: Gait Characteristics: decreased stride length, wide BOS, and poor foot clearance- Right, Distance walked: 270 feet, Assistive device utilized:cane held in hand but did not use it and None, and Comments: pt demonstrates consistent early heel strike on RLE and occassional early heel strike on LLE. Pt states it is because of the shoewear, will reassess next visit with pt instructed to wear sneakers next session.   FUNCTIONAL TESTS:  5 times sit to stand: 11.53 seconds, minimal UE support 2 minute walk test: 270 ft SLS 11/15/23: R: 1.5 seconds L: 0.96 seconds  PATIENT SURVEYS:  ABC scale: 930/1600 - 58.1%                                                                                                                              TREATMENT DATE:  12/15/2023: - [place]ep [place] UE/LE x63min - Bodycraft walkout retro 3Pl and sidestep (reports increased Lt foot pain so Dc'd  exercise -6 and 12in hurdles intermittent HHA 4RT forward  Sidelying: -Abduction 2x 10 (cueing to reduce ER) Supine: - Bridge on green theraball 2x 10 - Heel slides on theraball 20x Seated - Toe raises 20x   12/13/23: - Heel/toe raises, 2 set of 10 reps, pt cued for increased ROM, bilateral UE support on parallel bars - SLS average 3-5 max of 5 attempts - Tandem stance with one foot on 8in step height 2x 30 no HHA (cueing for posture) - Leg press 5Pl then 6Pl 2x 10  - Step up and overs, 8 inch step, no HHA - Standing hamstring curl - Balance beam with intermittent HHA tandem then sidestep 3RT  - 12in hurdles over balance beam 3RT forward and lateral step over.  12/06/2023  Therapeutic Exercise: -Treadmill, 5 minutes, 1.2>1.5>1.8 speed, grade 2.0, pt cued for UE placement and  speed -Heel/toe raises, 2 set of 10 reps, pt cued for increased ROM, bilateral UE support on parallel bars -Leg press, plate 4>6 2 sets of 10 reps, pt cued for eccentric control and decreased knee locking Neuromuscular Re-education: -Ankle weight walking marches/knee flexions, 2 laps on 20 foot line each variation, pt cued for max LE ROM -Aeromat walks(tandem/lateral stepping), in parallel bars/gait belt/SBA for safety,  3lb ankle weights, pt cued for decreased UE support when possible, 4 laps each variation -BOSU ball lunges, 2 sets of 7 reps, bilaterally, pt cued for decreased UE support and for proper LE placement -Sit to stands with tidal tank column, 2 sets of 10 reps, pt cued for max hip extension -Step up and overs, 8 inch step, 3lb ankle weights, pt cued for decreased UE support  -Lateral step up and overs, 8 inch step, 3lb ankle weights, pt cued for decreased UE support   11/25/2023  Therapeutic Exercise: -Treadmill, 4 minutes, 1.2 speed, grade 2.0, pt cued for UE placement and speed -Side plank, 2 reps of 15 second holds, pt cued for LE  -Supine bridges 2 sets of 10 reps, 3 second holds, RTB at knees, pt cued for max hip extension Neuromuscular Re-education: -Bird dog, 1 set of 8 reps bilaterally, pt cued for sequencing  -Lateral stepping 2 laps 20 steps per lap, with GTB around ankles, pt cued for upright posture -Monster walks, 2 laps of 15 steps, pt requests to use cane, cued for sequencing -Tandem stance with cane assist as needed, 1 bout of 30 seconds bilaterally -Sit to stands with tidal tank column, 1 sets of 10 reps, pt cued for core activation   11/23/23: Nustep UE/LE average 77 spm x Gait training without AD x 400 min cueing to improve heel strike Bed mobility, log rolling working on rolling, limited by shoulder pain Sidelying:  -Abduction 10x 3 Supine:  -Bridge 25x 3  -floating SLR 10x  -LTR 5x 10 Standing:  STS standard height 10x  Hurdles lateral  sidestep in //bars 3RT 6in then 12in intermittent HHA reduced with reps.   PATIENT EDUCATION: Education details: Pt was educated on findings of PT evaluation, prognosis, frequency of therapy visits and rationale, attendance policy, and HEP if given.   Person educated: Patient and Spouse Education method: Explanation, Verbal cues, and Handouts Education comprehension: verbalized understanding, verbal cues required, and needs further education  HOME EXERCISE PROGRAM: Access Code: [email or web address] URL: [email or web address] Date: 11/15/2023 Prepared by: Lang Ada  Exercises - Standing Single Leg Stance with Counter Support  - 1 x daily - 7 x weekly - 1 sets - 3 reps - 30  hold - Sit to Stand with Arms Crossed  - 1 x daily - 7 x weekly - 3 sets - 10 reps - Supine Bridge  - 1 x daily - 7 x weekly - 3 sets - 10 reps - 5 hold  Access Code: [email or web address] URL: [email or web address] Date: 11/18/2023 Prepared by: Lamarr Citrin  Exercises - Heel Toe Raises with Counter Support  - 1 x daily - 7 x weekly - 3 sets - 10 reps  Access Code: [email or web address] URL: [email or web address] Date: 11/23/2023 Prepared by: Augustin Mclean  Exercises - Standing Single Leg Stance with Counter Support  - 1 x daily - 7 x weekly - 1 sets - 3 reps - 30 hold - Sit to Stand with Arms Crossed  - 1 x daily - 7 x weekly - 3 sets - 10 reps - Supine Bridge  - 1 x daily - 7 x weekly - 3 sets - 10 reps - 5 hold - Heel Toe Raises with Counter Support  - 1 x daily - 7 x weekly - 3 sets - 10 reps - Sidelying Hip Abduction  - 2 x daily - 7 x weekly - 3 sets - 10 reps - 5 hold  GOALS: Goals reviewed with patient? Yes  SHORT TERM GOALS: Target date: 12/06/23  Patient will demonstrate evidence of independence with individualized HEP and will report compliance for at least 3 days per week for optimized progression towards remaining therapy goals. Baseline:  Goal status: INITIAL  2.  Patient  will report no falls for the next three weeks for demonstration of increased safety and community awareness for decreased risk of injury. Baseline: pt has fallen 6-8 times in the last 6 months Goal status: INITIAL     LONG TERM GOALS: Target date: 12/27/23  Pt will demonstrate a an increase of at least 15 percent on the ABC scale for improved confidence of balance during community ambulation and ADL. Baseline: see objective Goal status: INITIAL  2.  Pt will improve 2 MWT by 40 feet in order to demonstrate improved functional ambulatory capacity in community setting.  Baseline: see objective Goal status: INITIAL  3.  Pt will demonstrate increased MMT grade in RLE to at least a 4+/5, for increased strength during mobility and maximal efficiency of gait cycle during ambulation. Baseline: see objective Goal status: INITIAL  4.  Pt will demonstrate the ability to get up from the floor in supine position to standing with modified independence and self capable strategies. Baseline: pt does not feel like he would be able to complete task at eval Goal status: INITIAL  5.  Pt will improve 5TSTS by 2.3 seconds in order to improve strength during functional activities. Baseline: see objective Goal status: INITIAL  6. Pt will demonstrate ability to improve SLS to 10 seconds bilaterally for improved balance and decreased fall risk. Baseline: see objective Goal status: INITIAL  ASSESSMENT:  CLINICAL IMPRESSION:  Pt limited by Lt foot pain altering gait mechanics and limited tolerance with weight bearing activities.  Added bodycraft for hip stability, strengthening and controlled movements with min guard for safety, limited by pain with sidestep, DC'd exercise.  Also limited with SLS and dynamic surface required intermittent HHA for balance.  Mat activities complete for hip strengthening, cueing for controlled movements and to reduce ER during abduction.  Pt encouraged to contact MD/Podiatrist  concerning ongoing foot pain.     (Eval) Patient is a 73 y.o. male who was seen today for physical therapy evaluation and  treatment for I63.9 (ICD-10-CM) - Acute CVA (cerebrovascular accident) (HCC). Patient demonstrates impaired coordination on LUE, decreased LE strength, abnormal pain rating in shoulders, abnormal gait pattern, and impaired balance. Patient also demonstrates difficulty with ambulation during today's session with decreased stride length, consistent early heel strike on RLE, and decreased velocity noted although able to complete with no AD this date. Patient also demonstrates ability to navigate stairs with reciprocal pattern with use of one UE on railing ascending and descending. Patient requires education on role of PT, importance of frequent physical activity and importance of HEP compliance. Patient would benefit from skilled physical therapy for increased endurance/efficiency with ambulation, increased LE strength, and balance for improved gait quality, return to higher level of function with ADLs, and progress towards therapy goals.   OBJECTIVE IMPAIRMENTS: Abnormal gait, decreased balance, decreased coordination, decreased endurance, decreased mobility, difficulty walking, decreased strength, impaired sensation, and pain.   ACTIVITY LIMITATIONS: carrying, lifting, bending, squatting, sleeping, transfers, and bed mobility  PARTICIPATION LIMITATIONS: meal prep, cleaning, laundry, driving, shopping, community activity, and yard work  PERSONAL FACTORS: Age, Past/current experiences, Time since onset of injury/illness/exacerbation, and 1-2 comorbidities: heart issues, diabetes are also affecting patient's functional outcome.   REHAB POTENTIAL: Fair neurological in nature  CLINICAL DECISION MAKING: Stable/uncomplicated  EVALUATION COMPLEXITY: Low  PLAN:  PT FREQUENCY: 1-2x/week  PT DURATION: 6 weeks  PLANNED INTERVENTIONS: 97110-Therapeutic exercises, 97530-  Therapeutic activity, [id number]- Neuromuscular re-education, 97535- Self Care, [id number]- Manual therapy, [phone]- Gait training, Patient/Family education, Balance training, Stair training, and DME instructions  PLAN FOR NEXT SESSION: progress functional mobility (floor to standing transfer), progress gait and balance training, progress LE strengthening especially RLE, encourage HEP.  Next session review Lt foot pain tolerance and add lunges and progress balance as able.  Augustin Mclean, LPTA/CLT; CBIS [phone]  8:16 AM, 12/15/23

## 2023-12-16 ENCOUNTER — Ambulatory Visit: Admitting: Orthopedic Surgery

## 2023-12-16 DIAGNOSIS — M7551 Bursitis of right shoulder: Secondary | ICD-10-CM

## 2023-12-16 MED ORDER — METHYLPREDNISOLONE ACETATE 40 MG/ML IJ SUSP
40.0000 mg | Freq: Once | INTRAMUSCULAR | Status: AC
  Administered 2023-12-16: 40 mg via INTRA_ARTICULAR

## 2023-12-16 NOTE — Progress Notes (Signed)
    Chief Complaint  Patient presents with   Foot Pain    L foot tenderness and callus for 37 wks    73 year old male with recurrent callus on his left foot over the plantar aspect of the fifth metatarsal  However, I am seeing him for his left shoulder which is much better after injection  He would like a repeat injection of his right shoulder for bursitis   Regarding the left shoulder he now has full range of motion no pain with range of motion   On the right shoulder he has slight limitations in range of motion but still can elevate and abduct without major discomfort.  He describes this as moderate 4-5 out of 10 pain   Right shoulder injection   Procedure note the subacromial injection shoulder RIGHT    Verbal consent was obtained to inject the  RIGHT   Shoulder  Timeout was completed to confirm the injection site is a subacromial space of the  RIGHT  shoulder   Medication used Depo-Medrol  40 mg and lidocaine  1% 3 cc  Anesthesia was provided by ethyl chloride  The injection was performed in the RIGHT  posterior subacromial space. After pinning the skin with alcohol and anesthetized the skin with ethyl chloride the subacromial space was injected using a 20-gauge needle. There were no complications  Sterile dressing was applied.

## 2023-12-16 NOTE — Progress Notes (Signed)
  Intake history:  Chief Complaint  Patient presents with   Foot Pain    L foot tenderness and callus for 2 wks    Follow-up    L shoulder feeling better     There were no vitals taken for this visit. There is no height or weight on file to calculate BMI.  Pharmacy? ______________________________________  WHAT ARE WE SEEING YOU FOR TODAY?   left foot/feet  How long has this bothered you? (DOI?DOS?WS?)  approximately 2 week(s)   Was there an injury? No  Anticoag.  Yes  Diabetes Yes  Heart disease Yes  Hypertension Yes  SMOKING HX Yes  Kidney disease No  Any ALLERGIES _Morphine/Codeine   Treatment:  Have you taken:  Tylenol  No  Advil  No  Had PT No  Had injection No  Other  _________________________

## 2023-12-16 NOTE — Patient Instructions (Signed)
 Dr Blinda Podiatry call for appointment   [facility] [address] [place], [place] [address] [phone]

## 2023-12-16 NOTE — Addendum Note (Signed)
 Addended by: VICENTA EMMIE HERO on: 12/16/2023 09:56 AM   Modules accepted: Orders

## 2023-12-19 ENCOUNTER — Telehealth: Payer: Self-pay

## 2023-12-19 NOTE — Telephone Encounter (Signed)
 Results reviewed by Ruthell NP. Please call patient and review results. He will need a 6 month follow up scan to evaluate the 13.7 mm nodule. Place order. She may discuss symptoms of GERD with PCP if medication is warranted. Results and plan to PCP.  IMPRESSION: 1. Lung rads 2, benign appearance or behavior. Continue annual screening with low-dose chest CT without contrast in 12 months. 2. Chronic esophageal wall thickening, suggesting esophagitis. 3. Cholelithiasis. 4. Chronic calcific pancreatitis 5. Aortic Atherosclerosis (ICD10-I70.0) and Emphysema (ICD10-J43.9). 6.  Coronary artery atherosclerosis.

## 2023-12-19 NOTE — Telephone Encounter (Unsigned)
 Results reviewed by Ruthell NP. Please call patient and review results. Place order. Results and plan to PCP.  IMPRESSION: 1. Lung rads 2, benign appearance or behavior. Continue annual screening with low-dose chest CT without contrast in 12 months. 2. Chronic esophageal wall thickening, suggesting esophagitis. 3. Cholelithiasis. 4. Chronic calcific pancreatitis 5. Aortic Atherosclerosis (ICD10-I70.0) and Emphysema (ICD10-J43.9). 6.  Coronary artery atherosclerosis.

## 2023-12-20 ENCOUNTER — Ambulatory Visit (HOSPITAL_COMMUNITY): Admitting: Physical Therapy

## 2023-12-20 ENCOUNTER — Telehealth (HOSPITAL_COMMUNITY): Payer: Self-pay | Admitting: Physical Therapy

## 2023-12-20 ENCOUNTER — Other Ambulatory Visit: Payer: Self-pay

## 2023-12-20 DIAGNOSIS — Z87891 Personal history of nicotine dependence: Secondary | ICD-10-CM

## 2023-12-20 DIAGNOSIS — Z122 Encounter for screening for malignant neoplasm of respiratory organs: Secondary | ICD-10-CM

## 2023-12-20 DIAGNOSIS — R911 Solitary pulmonary nodule: Secondary | ICD-10-CM

## 2023-12-20 NOTE — Telephone Encounter (Signed)
 Called and left VM for pt

## 2023-12-20 NOTE — Telephone Encounter (Signed)
 Results have been reviewed with the patient. He will complete a follow up scan in March 2026, request call back closer to due date to schedule. Patient denied symptoms of GERD. Order placed. Results and plan to PCP.

## 2023-12-20 NOTE — Telephone Encounter (Signed)
 Pt did not show for appt (2nd NS).  Called and spoke to pt who states his foot was hurting and that's why he didn't come.  Reminded of next appt and states he will be here for that.   Greig KATHEE Fuse, PTA/CLT [facility] Ph: [phone]

## 2023-12-21 ENCOUNTER — Ambulatory Visit

## 2023-12-21 VITALS — Ht 67.0 in | Wt 178.0 lb

## 2023-12-21 DIAGNOSIS — Z0000 Encounter for general adult medical examination without abnormal findings: Secondary | ICD-10-CM

## 2023-12-21 NOTE — Patient Instructions (Signed)
 Jonathan Gilmore,  Thank you for taking the time for your Medicare Wellness Visit. I appreciate your continued commitment to your health goals. Please review the care plan we discussed, and feel free to reach out if I can assist you further.  Medicare recommends these wellness visits once per year to help you and your care team stay ahead of potential health issues. These visits are designed to focus on prevention, allowing your provider to concentrate on managing your acute and chronic conditions during your regular appointments.  Please note that Annual Wellness Visits do not include a physical exam. Some assessments may be limited, especially if the visit was conducted virtually. If needed, we may recommend a separate in-person follow-up with your provider.  Ongoing Care Seeing your primary care provider every 3 to 6 months helps us  monitor your health and provide consistent, personalized care.   Referrals If a referral was made during today's visit and you haven't received any updates within two weeks, please contact the referred provider directly to check on the status.  Recommended Screenings:  Health Maintenance  Topic Date Due   Yearly kidney health urinalysis for diabetes  Never done   Hepatitis C Screening  Never done   Zoster (Shingles) Vaccine (1 of 2) Never done   Complete foot exam   01/29/2023   COVID-19 Vaccine (6 - 2025-26 season) 11/14/2023   Hemoglobin A1C  04/28/2024   Eye exam for diabetics  08/08/2024   Yearly kidney function blood test for diabetes  11/01/2024   Medicare Annual Wellness Visit  12/20/2024   DTaP/Tdap/Td vaccine (2 - Td or Tdap) 08/14/2030   Colon Cancer Screening  04/05/2032   Pneumococcal Vaccine for age over 60  Completed   Flu Shot  Completed   Meningitis B Vaccine  Aged Out   Screening for Lung Cancer  Discontinued       12/21/2023   11:10 AM  Advanced Directives  Does Patient Have a Medical Advance Directive? No  Would patient like  information on creating a medical advance directive? Yes (MAU/Ambulatory/Procedural Areas - Information given)   Advance Care Planning is important because it: Ensures you receive medical care that aligns with your values, goals, and preferences. Provides guidance to your family and loved ones, reducing the emotional burden of decision-making during critical moments.  Information on Advanced Care Planning can be found at [place]  Secretary of [facility] Advance Health Care Directives Advance Health Care Directives ([email or web address]   Vision: Annual vision screenings are recommended for early detection of glaucoma, cataracts, and diabetic retinopathy. These exams can also reveal signs of chronic conditions such as diabetes and high blood pressure.  Dental: Annual dental screenings help detect early signs of oral cancer, gum disease, and other conditions linked to overall health, including heart disease and diabetes.  Please see the attached documents for additional preventive care recommendations.

## 2023-12-21 NOTE — Progress Notes (Signed)
 Subjective:   Jonathan Gilmore is a 73 y.o. who presents for a Medicare Wellness preventive visit.  As a reminder, Annual Wellness Visits don't include a physical exam, and some assessments may be limited, especially if this visit is performed virtually. We may recommend an in-person follow-up visit with your provider if needed.  Visit Complete: Virtual I connected with  Jonathan Gilmore on 12/21/23 by a audio enabled telemedicine application and verified that I am speaking with the correct person using two identifiers.  Patient Location: Home  Provider Location: Home Office  I discussed the limitations of evaluation and management by telemedicine. The patient expressed understanding and agreed to proceed.  Vital Signs: Because this visit was a virtual/telehealth visit, some criteria may be missing or patient reported. Any vitals not documented were not able to be obtained and vitals that have been documented are patient reported.  VideoDeclined- This patient declined Librarian, academic. Therefore the visit was completed with audio only.  Persons Participating in Visit: Patient.  AWV Questionnaire: No: Patient Medicare AWV questionnaire was not completed prior to this visit.  Cardiac Risk Factors include: advanced age (>68men, >8 women);hypertension;male gender;dyslipidemia;diabetes mellitus     Objective:    Today's Vitals   12/21/23 1104  Weight: 178 lb (80.7 kg)  Height: 5' 7 (1.702 m)   Body mass index is 27.88 kg/m.     12/21/2023   11:10 AM 11/15/2023    8:02 AM 10/27/2023    6:02 PM 10/27/2023    2:36 PM 07/18/2023    2:12 PM 04/08/2023    2:13 PM 11/17/2022    2:06 PM  Advanced Directives  Does Patient Have a Medical Advance Directive? No Yes Yes No Yes Yes No  Type of Advance Directive   Living will  Living will;Healthcare Power of [employer] Power of [place];Living will   Does patient want to make changes to medical advance  directive?  No - Patient declined No - Patient declined   No - Patient declined   Copy of Healthcare Power of Attorney in Chart?      No - copy requested   Would patient like information on creating a medical advance directive? Yes (MAU/Ambulatory/Procedural Areas - Information given) No - Patient declined No - Patient declined No - Patient declined   No - Patient declined    Current Medications (verified) Outpatient Encounter Medications as of 12/21/2023  Medication Sig   ACCU-CHEK AVIVA PLUS test strip USE AS INSTRUCTED   apixaban  (ELIQUIS ) 5 MG TABS tablet Take 1 tablet (5 mg total) by mouth 2 (two) times daily.   atorvastatin  (LIPITOR ) 80 MG tablet Take 1 tablet (80 mg total) by mouth daily.   Calcium -Magnesium -Zinc (CAL-MAG-ZINC PO) Take 1 tablet by mouth daily.   furosemide  (LASIX ) 40 MG tablet TAKE 1 TABLET BY MOUTH DAILY AS NEEDED FOR EDEMA(SWELLING)   gabapentin  (NEURONTIN ) 300 MG capsule TAKE 1 TO 2 CAPSULES BY MOUTH  TWICE DAILY   lisinopril  (ZESTRIL ) 20 MG tablet Take 1 tablet (20 mg total) by mouth daily.   metFORMIN  (GLUCOPHAGE ) 500 MG tablet Take 2 tablets (1,000 mg total) by mouth 2 (two) times daily with a meal.   metoprolol  succinate (TOPROL -XL) 50 MG 24 hr tablet TAKE 1 TABLET BY MOUTH DAILY   Multiple Vitamin (MULTIVITAMIN WITH MINERALS) TABS Take 1 tablet by mouth daily.   nicotine  (NICODERM CQ  - DOSED IN MG/24 HOURS) 21 mg/24hr patch Place 1 patch (21 mg total) onto the skin  daily.   nitroGLYCERIN  (NITROSTAT ) 0.4 MG SL tablet Place 1 tablet (0.4 mg total) under the tongue every 5 (five) minutes x 3 doses as needed for chest pain (if no relief after 2nd dose, proceed to ED for an evaluation).   Omega-3 Fatty Acids (OMEGA 3 FISH OIL PO) Take 1 capsule by mouth daily.   pantoprazole  (PROTONIX ) 40 MG tablet Take 1 tablet (40 mg total) by mouth 2 (two) times daily.   varenicline  (CHANTIX  CONTINUING MONTH PAK) 1 MG tablet Take 1 tablet (1 mg total) by mouth 2 (two) times daily.  Set quit date   No facility-administered encounter medications on file as of 12/21/2023.    Allergies (verified) Morphine and codeine   History: Past Medical History:  Diagnosis Date   Abnormal cardiovascular stress test 03/2013   Anxiety    Arthritis    Back pain    Cancer (HCC)    Prostate cancer-no treatment per pt   CHF (congestive heart failure) (HCC)    Clotting disorder    Coronary artery disease    Multivessel with significant LAD involvement by chest CT 2014   Depression    Diastolic dysfunction    Essential hypertension    Heart murmur    Lumbar herniated disc    L4-L5   MRSA (methicillin resistant staph aureus) culture positive    Myocardial infarction (HCC)    Neuropathy    Shortness of breath dyspnea    Sleep apnea    No cpap   Stroke (HCC) 10/24/2023   Type 2 diabetes mellitus (HCC)    Past Surgical History:  Procedure Laterality Date   ANTERIOR CERVICAL DECOMP/DISCECTOMY FUSION  01/18/2011   Procedure: ANTERIOR CERVICAL DECOMPRESSION/DISCECTOMY FUSION 2 LEVELS;  Surgeon: Victory LABOR Pool;  Location: [facility];  Service: Neurosurgery;  Laterality: N/A;  anterior cervical discectomy and fusion with allograft and plating, cervical five-six, cervical six-seven   BIOPSY N/A 04/04/2015   Procedure: BIOPSY;  Surgeon: Claudis RAYMOND Rivet, MD;  Location: [facility];  Service: Endoscopy;  Laterality: N/A;   BIOPSY  11/09/2019   Procedure: BIOPSY;  Surgeon: Eartha Angelia Sieving, MD;  Location: [facility];  Service: Gastroenterology;;   Bone spur removed  1997   Left shoulder   CARDIAC CATHETERIZATION N/A 08/08/2015   Procedure: Right/Left Heart Cath and Coronary Angiography;  Surgeon: Ozell Fell, MD;  Location: [facility] INVASIVE CV LAB;  Service: Cardiovascular;  Laterality: N/A;   CARPAL TUNNEL RELEASE Right    COLONOSCOPY WITH PROPOFOL  N/A 11/09/2019   Procedure: COLONOSCOPY WITH PROPOFOL ;  Surgeon: Eartha Angelia Sieving, MD;  Location: [facility];   Service: Gastroenterology;  Laterality: N/A;  900   COLONOSCOPY WITH PROPOFOL  N/A 02/27/2021   Procedure: COLONOSCOPY WITH PROPOFOL ;  Surgeon: Eartha Angelia Sieving, MD;  [facility]: [facility];  Service: Gastroenterology;  Laterality: N/A;  7:30   COLONOSCOPY WITH PROPOFOL  N/A 04/05/2022   Procedure: COLONOSCOPY WITH PROPOFOL ;  Surgeon: Shila Gustav GAILS, MD;  Location: [facility];  Service: Gastroenterology;  Laterality: N/A;   ENTEROSCOPY N/A 06/30/2022   Procedure: ENTEROSCOPY;  Surgeon: Eartha Angelia Sieving, MD;  [facility];  Service: Gastroenterology;  Laterality: N/A;  2:00pm;ASA 3   ESOPHAGEAL DILATION N/A 04/04/2015   Procedure: ESOPHAGEAL DILATION;  Surgeon: Claudis RAYMOND Rivet, MD;  [facility];  Service: Endoscopy;  Laterality: N/A;   ESOPHAGEAL DILATION N/A 11/09/2019   Procedure: ESOPHAGEAL DILATION;  Surgeon: Eartha Angelia Sieving, MD;  [facility];  Service:  Gastroenterology;  Laterality: N/A;   ESOPHAGOGASTRODUODENOSCOPY N/A 04/04/2015   Procedure: ESOPHAGOGASTRODUODENOSCOPY (EGD);  Surgeon: Claudis RAYMOND Rivet, MD;  [facility];  Service: Endoscopy;  Laterality: N/A;  1240   ESOPHAGOGASTRODUODENOSCOPY (EGD) WITH PROPOFOL  N/A 11/09/2019   Procedure: ESOPHAGOGASTRODUODENOSCOPY (EGD) WITH PROPOFOL ;  Surgeon: Eartha Angelia Sieving, MD;  [facility];  Service: Gastroenterology;  Laterality: N/A;   ESOPHAGOGASTRODUODENOSCOPY (EGD) WITH PROPOFOL  N/A 04/04/2022   Procedure: ESOPHAGOGASTRODUODENOSCOPY (EGD) WITH PROPOFOL ;  Surgeon: Leigh Elspeth SQUIBB, MD;  Location: [facility];  Service: Gastroenterology;  Laterality: N/A;   ESOPHAGOGASTRODUODENOSCOPY (EGD) WITH PROPOFOL   06/30/2022   Procedure: ESOPHAGOGASTRODUODENOSCOPY (EGD) WITH PROPOFOL ;  Surgeon: Eartha Angelia Sieving, MD;  [facility];  Service: Gastroenterology;;   GIVENS CAPSULE STUDY N/A 05/11/2022   Procedure: GIVENS CAPSULE STUDY;  Surgeon:  Eartha Angelia Sieving, MD;  [facility];  Service: Gastroenterology;  Laterality: N/A;  7:30 am   HEMORRHOID SURGERY     HOT HEMOSTASIS  06/30/2022   Procedure: HOT HEMOSTASIS (ARGON PLASMA COAGULATION/BICAP);  Surgeon: Eartha Angelia, Sieving, MD;  [facility];  Service: Gastroenterology;;   POLYPECTOMY  11/09/2019   Procedure: POLYPECTOMY;  Surgeon: Eartha Angelia, Sieving, MD;  [facility];  Service: Gastroenterology;;   POLYPECTOMY  02/27/2021   Procedure: POLYPECTOMY;  Surgeon: Eartha Angelia, Sieving, MD;  [facility];  Service: Gastroenterology;;   POLYPECTOMY  04/05/2022   Procedure: POLYPECTOMY;  Surgeon: Shila Gustav GAILS, MD;  Location: [facility] ENDOSCOPY;  Service: Gastroenterology;;   TONSILLECTOMY     Family History  Problem Relation Age of Onset   Lung cancer Mother    Diabetes Father    Hepatitis B Father    Diabetes Sister    Cushing syndrome Sister    Social History   Socioeconomic History   Marital status: Married    Spouse name: Not on file   Number of children: Not on file   Years of education: Not on file   Highest education level: Not on file  Occupational History   Not on file  Tobacco Use   Smoking status: Every Day    Current packs/day: 1.00    Average packs/day: 1 pack/day for 56.1 years (56.1 ttl pk-yrs)    Types: Cigarettes    Start date: 11/26/1967   Smokeless tobacco: Former   Tobacco comments:    smoking x 50 yrs  Vaping Use   Vaping status: Never Used  Substance and Sexual Activity   Alcohol use: Not Currently    Comment: rare   Drug use: Yes    Frequency: 5.0 times per week    Types: Marijuana    Comment: as needed   Sexual activity: Yes  Other Topics Concern   Not on file  Social History Narrative   Lives with wife   Right Handed   Drinks 2-3 cups caffeine daily   Social Drivers of Health   Financial Resource Strain: Low Risk  (12/21/2023)   Overall Financial Resource Strain  (CARDIA)    Difficulty of Paying Living Expenses: Not hard at all  Food Insecurity: No Food Insecurity (12/21/2023)   Hunger Vital Sign    Worried About Running Out of Food in the Last Year: Never true    Ran Out of Food in the Last Year: Never true  Transportation Needs: No Transportation Needs (12/21/2023)   PRAPARE - Administrator, Civil Service (Medical): No    Lack of Transportation (Non-Medical): No  Physical  Activity: Insufficiently Active (12/21/2023)   Exercise Vital Sign    Days of Exercise per Week: 2 days    Minutes of Exercise per Session: 30 min  Stress: No Stress Concern Present (12/21/2023)   [employer] of Occupational Health - Occupational Stress Questionnaire    Feeling of Stress: Not at all  Social Connections: Unknown (12/21/2023)   Social Connection and Isolation Panel    Frequency of Communication with Friends and Family: More than three times a week    Frequency of Social Gatherings with Friends and Family: More than three times a week    Attends Religious Services: Patient declined    Database administrator or Organizations: Patient declined    Attends Engineer, structural: Patient declined    Marital Status: Married    Tobacco Counseling Ready to quit: Not Answered Counseling given: Not Answered Tobacco comments: smoking x 50 yrs    Clinical Intake:  Pre-visit preparation completed: Yes  Pain : No/denies pain  Diabetes: Yes CBG done?: No Did pt. bring in CBG monitor from home?: No  Lab Results  Component Value Date   HGBA1C 7.1 (H) 10/27/2023   HGBA1C 8.4 (H) 08/19/2022   HGBA1C 8.3 (H) 04/02/2022     How often do you need to have someone help you when you read instructions, pamphlets, or other written materials from your doctor or pharmacy?: 1 - Never  Interpreter Needed?: No  Information entered by :: Charmaine Bloodgood LPN   Activities of Daily Living     12/21/2023   11:05 AM 10/27/2023    6:02 PM  In your  present state of health, do you have any difficulty performing the following activities:  Hearing? 0 0  Vision? 0 0  Difficulty concentrating or making decisions? 0 0  Walking or climbing stairs? 0   Dressing or bathing? 0   Doing errands, shopping? 0 0  Preparing Food and eating ? N   Using the Toilet? N   In the past six months, have you accidently leaked urine? N   Do you have problems with loss of bowel control? N   Managing your Medications? N   Managing your Finances? N   Housekeeping or managing your Housekeeping? N     Patient Care Team: Aletha Bene, MD as PCP - General (Family Medicine) Alvan Dorn FALCON, MD as PCP - Cardiology (Cardiology) Eartha Flavors, Toribio, MD as Consulting Physician (Gastroenterology) Humphrey Manus Hoit, OD (Optometry) Margrette Taft BRAVO, MD as Consulting Physician (Orthopedic Surgery) Gregg Lek, MD as Consulting Physician (Neurology) Sherrilee Belvie CROME, MD as Consulting Physician (Urology) Davonna Siad, MD as Consulting Physician (Oncology)  I have updated your Care Teams any recent Medical Services you may have received from other providers in the past year.     Assessment:   This is a routine wellness examination for Jonathan Gilmore.  Hearing/Vision screen Hearing Screening - Comments:: Patient is able to hear conversational tones without difficulty. No issues reported.   Vision Screening - Comments:: up to date with routine eye exams with [facility].    Goals Addressed             This Visit's Progress    Maintain health and independence   On track    COMPLETED: Patient Stated       Continue current lifestyle       Depression Screen     12/21/2023   11:08 AM 11/09/2023   11:15 AM 11/17/2022    1:37 PM 08/19/2022  10:52 AM 04/19/2022   11:01 AM 01/28/2022   10:34 AM 01/20/2022    1:09 PM  PHQ 2/9 Scores  PHQ - 2 Score 1 1 2  0 0 0 0  PHQ- 9 Score 4  5 0 0 7 4    Fall Risk     12/21/2023   11:10 AM 11/30/2023    11:00 AM 10/31/2023    3:35 PM 11/17/2022    1:34 PM 08/19/2022   10:52 AM  Fall Risk   Falls in the past year? 0 0 0 0 0  Number falls in past yr: 0 0 0 0 0  Injury with Fall? 0 0 0 0 0  Comment   N/A- no falls reported x last 12 months    Risk for fall due to : No Fall Risks Other (Comment) Other (Comment)  No Fall Risks  Risk for fall due to: Comment  Recent CVA recent CVA; post-hospital discharge weakness    Follow up Falls prevention discussed;Education provided;Falls evaluation completed Education provided;Falls prevention discussed Education provided;Falls prevention discussed Falls evaluation completed;Education provided;Falls prevention discussed Falls evaluation completed    MEDICARE RISK AT HOME:  Medicare Risk at Home Any stairs in or around the home?: Yes If so, are there any without handrails?: No Home free of loose throw rugs in walkways, pet beds, electrical cords, etc?: Yes Adequate lighting in your home to reduce risk of falls?: Yes Life alert?: No Use of a cane, walker or w/c?: No Grab bars in the bathroom?: Yes Shower chair or bench in shower?: No Elevated toilet seat or a handicapped toilet?: No  TIMED UP AND GO:  Was the test performed?  No  Cognitive Function: 6CIT completed    11/03/2020    1:15 PM  MMSE - Mini Mental State Exam  Orientation to time 5  Orientation to Place 5  Registration 3  Attention/ Calculation 5  Recall 3  Language- name 2 objects 2  Language- repeat 0  Language- follow 3 step command 3  Language- read & follow direction 1  Write a sentence 0  Copy design 0  Total score 27        12/21/2023   11:10 AM 11/17/2022    1:33 PM 01/20/2022    1:06 PM  6CIT Screen  What Year? 0 points 0 points 0 points  What month? 0 points 0 points 0 points  What time? 0 points 0 points 0 points  Count back from 20 0 points 0 points 0 points  Months in reverse 0 points 0 points 0 points  Repeat phrase 0 points 0 points 0 points  Total Score 0  points 0 points 0 points    Immunizations Immunization History  Administered Date(s) Administered   Fluad Quad(high Dose 65+) 11/29/2018   INFLUENZA, HIGH DOSE SEASONAL PF 01/05/2017, 01/15/2018, 11/25/2023   Influenza Split 01/19/2011   Influenza,inj,Quad PF,6+ Mos 02/25/2013   Moderna Sars-Covid-2 Vaccination 04/19/2019, 05/18/2019, 01/11/2020, 07/24/2020, 12/05/2020   Pneumococcal Conjugate-13 11/29/2018   Pneumococcal Polysaccharide-23 02/25/2013, 10/22/2021   Tdap 08/13/2020    Screening Tests Health Maintenance  Topic Date Due   Diabetic kidney evaluation - Urine ACR  Never done   Hepatitis C Screening  Never done   Zoster Vaccines- Shingrix (1 of 2) Never done   FOOT EXAM  01/29/2023   COVID-19 Vaccine (6 - 2025-26 season) 11/14/2023   HEMOGLOBIN A1C  04/28/2024   OPHTHALMOLOGY EXAM  08/08/2024   Diabetic kidney evaluation - eGFR measurement  11/01/2024   Medicare Annual Wellness (AWV)  12/20/2024   DTaP/Tdap/Td (2 - Td or Tdap) 08/14/2030   Colonoscopy  04/05/2032   Pneumococcal Vaccine: 50+ Years  Completed   Influenza Vaccine  Completed   Meningococcal B Vaccine  Aged Out   Lung Cancer Screening  Discontinued    Health Maintenance Items Addressed: Vaccines Due: Shingrix   Additional Screening:  Vision Screening: Recommended annual ophthalmology exams for early detection of glaucoma and other disorders of the eye. Is the patient up to date with their annual eye exam?  Yes  Who is the provider or what is the name of the office in which the patient attends annual eye exams? [facility].   Dental Screening: Recommended annual dental exams for proper oral hygiene  Community Resource Referral / Chronic Care Management: CRR required this visit?  No   CCM required this visit?  No   Plan:    I have personally reviewed and noted the following in the patient's chart:   Medical and social history Use of alcohol, tobacco or illicit drugs  Current medications and  supplements including opioid prescriptions. Patient is not currently taking opioid prescriptions. Functional ability and status Nutritional status Physical activity Advanced directives List of other physicians Hospitalizations, surgeries, and ER visits in previous 12 months Vitals Screenings to include cognitive, depression, and falls Referrals and appointments  In addition, I have reviewed and discussed with patient certain preventive protocols, quality metrics, and best practice recommendations. A written personalized care plan for preventive services as well as general preventive health recommendations were provided to patient.   Lavelle Pfeiffer [place], [place]   [id number]/03/[id number]   After Visit Summary: (MyChart) Due to this being a telephonic visit, the after visit summary with patients personalized plan was offered to patient via MyChart   Notes: Nothing significant to report at this time.

## 2023-12-22 ENCOUNTER — Encounter (HOSPITAL_COMMUNITY): Payer: Self-pay

## 2023-12-22 ENCOUNTER — Ambulatory Visit (HOSPITAL_COMMUNITY)

## 2023-12-22 DIAGNOSIS — Z9181 History of falling: Secondary | ICD-10-CM

## 2023-12-22 DIAGNOSIS — R262 Difficulty in walking, not elsewhere classified: Secondary | ICD-10-CM | POA: Diagnosis not present

## 2023-12-22 DIAGNOSIS — Z7409 Other reduced mobility: Secondary | ICD-10-CM

## 2023-12-22 NOTE — Therapy (Signed)
 OUTPATIENT PHYSICAL THERAPY NEURO TREATMENT   Patient Name: Jonathan Gilmore MRN: [id number] DOB:[birth year], 73 y.o., male Today's Date: 12/22/2023     END OF SESSION:  PT End of Session - 12/22/23 0731     Visit Number 9    Number of Visits 12    Date for Recertification  12/27/23    Authorization Type [employer] MEDICARE    Authorization Time Period uhc approved 12 visits from 11/15/23-12/27/23 [phone]    Authorization - Visit Number 9    Authorization - Number of Visits 12    Progress Note Due on Visit 10    PT Start Time 0731    PT Stop Time 0812    PT Time Calculation (min) 41 min    Equipment Utilized During Treatment Gait belt    Activity Tolerance Patient tolerated treatment well;Patient limited by pain    Behavior During Therapy WFL for tasks assessed/performed              Past Medical History:  Diagnosis Date   Abnormal cardiovascular stress test 03/2013   Anxiety    Arthritis    Back pain    Cancer (HCC)    Prostate cancer-no treatment per pt   CHF (congestive heart failure) (HCC)    Clotting disorder    Coronary artery disease    Multivessel with significant LAD involvement by chest CT 2014   Depression    Diastolic dysfunction    Essential hypertension    Heart murmur    Lumbar herniated disc    L4-L5   MRSA (methicillin resistant staph aureus) culture positive    Myocardial infarction (HCC)    Neuropathy    Shortness of breath dyspnea    Sleep apnea    No cpap   Stroke (HCC) 10/24/2023   Type 2 diabetes mellitus (HCC)    Past Surgical History:  Procedure Laterality Date   ANTERIOR CERVICAL DECOMP/DISCECTOMY FUSION  01/18/2011   Procedure: ANTERIOR CERVICAL DECOMPRESSION/DISCECTOMY FUSION 2 LEVELS;  Surgeon: Victory LABOR Pool;  Location: [facility];  Service: Neurosurgery;  Laterality: N/A;  anterior cervical discectomy and fusion with allograft and plating, cervical five-six, cervical six-seven   BIOPSY N/A 04/04/2015   Procedure: BIOPSY;   Surgeon: Claudis RAYMOND Rivet, MD;  Location: [facility];  Service: Endoscopy;  Laterality: N/A;   BIOPSY  11/09/2019   Procedure: BIOPSY;  Surgeon: Eartha Angelia Sieving, MD;  Location: [facility];  Service: Gastroenterology;;   Bone spur removed  1997   Left shoulder   CARDIAC CATHETERIZATION N/A 08/08/2015   Procedure: Right/Left Heart Cath and Coronary Angiography;  Surgeon: Ozell Fell, MD;  Location: [facility] INVASIVE CV LAB;  Service: Cardiovascular;  Laterality: N/A;   CARPAL TUNNEL RELEASE Right    COLONOSCOPY WITH PROPOFOL  N/A 11/09/2019   Procedure: COLONOSCOPY WITH PROPOFOL ;  Surgeon: Eartha Angelia Sieving, MD;  [facility];  Service: Gastroenterology;  Laterality: N/A;  900   COLONOSCOPY WITH PROPOFOL  N/A 02/27/2021   Procedure: COLONOSCOPY WITH PROPOFOL ;  Surgeon: Eartha Angelia Sieving, MD;  [facility];  Service: Gastroenterology;  Laterality: N/A;  7:30   COLONOSCOPY WITH PROPOFOL  N/A 04/05/2022   Procedure: COLONOSCOPY WITH PROPOFOL ;  Surgeon: Shila Gustav GAILS, MD;  Location: [facility];  Service: Gastroenterology;  Laterality: N/A;   ENTEROSCOPY N/A 06/30/2022   Procedure: ENTEROSCOPY;  Surgeon: Eartha Angelia Sieving, MD;  [facility];  Service: Gastroenterology;  Laterality: N/A;  2:00pm;ASA 3   ESOPHAGEAL DILATION N/A 04/04/2015  Procedure: ESOPHAGEAL DILATION;  Surgeon: Claudis RAYMOND Rivet, MD;  [facility];  Service: Endoscopy;  Laterality: N/A;   ESOPHAGEAL DILATION N/A 11/09/2019   Procedure: ESOPHAGEAL DILATION;  Surgeon: Eartha Angelia Sieving, MD;  [facility];  Service: Gastroenterology;  Laterality: N/A;   ESOPHAGOGASTRODUODENOSCOPY N/A 04/04/2015   Procedure: ESOPHAGOGASTRODUODENOSCOPY (EGD);  Surgeon: Claudis RAYMOND Rivet, MD;  [facility];  Service: Endoscopy;  Laterality: N/A;  1240   ESOPHAGOGASTRODUODENOSCOPY (EGD) WITH PROPOFOL  N/A 11/09/2019   Procedure: ESOPHAGOGASTRODUODENOSCOPY  (EGD) WITH PROPOFOL ;  Surgeon: Eartha Angelia Sieving, MD;  [facility];  Service: Gastroenterology;  Laterality: N/A;   ESOPHAGOGASTRODUODENOSCOPY (EGD) WITH PROPOFOL  N/A 04/04/2022   Procedure: ESOPHAGOGASTRODUODENOSCOPY (EGD) WITH PROPOFOL ;  Surgeon: Leigh Elspeth SQUIBB, MD;  Location: [facility] ENDOSCOPY;  Service: Gastroenterology;  Laterality: N/A;   ESOPHAGOGASTRODUODENOSCOPY (EGD) WITH PROPOFOL   06/30/2022   Procedure: ESOPHAGOGASTRODUODENOSCOPY (EGD) WITH PROPOFOL ;  Surgeon: Eartha Angelia Sieving, MD;  [facility];  Service: Gastroenterology;;   GIVENS CAPSULE STUDY N/A 05/11/2022   Procedure: GIVENS CAPSULE STUDY;  Surgeon: Eartha Angelia Sieving, MD;  [facility];  Service: Gastroenterology;  Laterality: N/A;  7:30 am   HEMORRHOID SURGERY     HOT HEMOSTASIS  06/30/2022   Procedure: HOT HEMOSTASIS (ARGON PLASMA COAGULATION/BICAP);  Surgeon: Eartha Angelia, Sieving, MD;  [facility];  Service: Gastroenterology;;   POLYPECTOMY  11/09/2019   Procedure: POLYPECTOMY;  Surgeon: Eartha Angelia Sieving, MD;  [facility];  Service: Gastroenterology;;   POLYPECTOMY  02/27/2021   Procedure: POLYPECTOMY;  Surgeon: Eartha Angelia Sieving, MD;  [facility];  Service: Gastroenterology;;   POLYPECTOMY  04/05/2022   Procedure: POLYPECTOMY;  Surgeon: Shila Gustav GAILS, MD;  Location: [facility] ENDOSCOPY;  Service: Gastroenterology;;   TONSILLECTOMY     Patient Active Problem List   Diagnosis Date Noted   Acute CVA (cerebrovascular accident) (HCC) 10/28/2023   Paroxysmal atrial fibrillation (HCC) 10/28/2023   Hypokalemia 10/28/2023   Stroke (cerebrum) (HCC) 10/27/2023   Stroke (HCC) 10/27/2023   GERD (gastroesophageal reflux disease) 04/27/2023   Iron deficiency 04/08/2023   Belching 02/08/2023   Hx of fusion of cervical spine 11/22/2022   AVM (arteriovenous malformation) of small bowel, acquired 06/30/2022   Iron  deficiency anemia due to chronic blood loss 04/22/2022   History of esophagitis 04/22/2022   Heme positive stool 04/04/2022   Iron deficiency anemia 04/04/2022   Prostate cancer (HCC) 06/08/2021   History of colonic polyps 02/09/2021   Intestinal metaplasia of antrum of stomach without dysplasia 02/09/2021   Insomnia 03/12/2019   Pure hypercholesterolemia 11/29/2018   Hypogonadism, male 11/23/2018   High risk medication use 10/28/2018   Carotid stenosis, asymptomatic, bilateral 10/28/2018   Tobacco use 10/28/2018   Polyp of colon 10/28/2018   Lumbar disc herniation with radiculopathy 12/21/2017   Spinal stenosis of lumbar region without neurogenic claudication 12/21/2017   CAD -50% LAD  08/09/2015   Moderate aortic stenosis 08/09/2015   Acute diastolic CHF (congestive heart failure) (HCC) 08/07/2015   Hypersomnia with sleep apnea 09/20/2013   Syncope 09/19/2013   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   OSA (obstructive sleep apnea) -non compliant 09/19/2013   Narcolepsy and cataplexy 09/19/2013   Facial abscess 04/28/2013   Facial cellulitis 04/28/2013   Mixed hyperlipidemia 04/28/2013   Tobacco dependence 04/28/2013   Sepsis (HCC) 04/28/2013   Gout 04/28/2013   Chronic diastolic CHF (congestive heart failure) (HCC) 04/28/2013   Pleuritic pain 02/24/2013   Murmur  02/24/2013   Pericarditis 02/24/2013   Type 2 diabetes mellitus with hyperlipidemia (HCC) 02/24/2013   Essential hypertension 02/24/2013   Cervical spondylosis with myelopathy 01/19/2011   PCP: Levora Reyes SAUNDERS, MD  REFERRING PROVIDER: Noralee Elidia Sieving, MD ONSET DATE: 11/03/23  REFERRING DIAG: I63.9 (ICD-10-CM) - Acute CVA (cerebrovascular accident) (HCC)  THERAPY DIAG:  Difficulty in walking, not elsewhere classified  Impaired functional mobility, balance, and endurance  History of falling  Rationale for Evaluation and Treatment: Rehabilitation  SUBJECTIVE:                                                                                                                                                                                              SUBJECTIVE STATEMENT: Pt states he has still been dealing with some foot pain, has a appointment for foot the 28th. Pt states the foot is just callused not like a wound.  Pt states he feels he has been using the left arm and left leg more since the stroke.  (Initial) Pt states he lost balance when stroke occurred, was not using cane before the stroke happened. Pt states he was outiside weedeating when the stroke occurred and fell several times, was crawling around due to imbalance/dizziness. Pt denies any weakness or numbness related to the stroke, does have neuropahty due to diabetes. Pts wife states pt was leaning to the right side and right eye was drooping. Pt accompanied by: self and significant other  PERTINENT HISTORY:  Diabetes, neuropathy CHF Aortic Valve issue  PAIN:  Are you having pain? Yes: NPRS scale: 8/10 Pain location: lateral aspect Lt foot Pain description: aches, sharp pain with movement Aggravating factors: sleeping, moving Relieving factors: laying on back  PRECAUTIONS: Fall  RED FLAGS: None   WEIGHT BEARING RESTRICTIONS: No  FALLS: Has patient fallen in last 6 months? Yes. Number of falls 6-8  LIVING ENVIRONMENT: Lives with: lives with their spouse Lives in: House/apartment Stairs: Yes: External: 2 steps; on right going up Has following equipment at home: Single point cane and Walker - 2 wheeled  PLOF: Independent and Independent with basic ADLs  PATIENT GOALS: improve balance and walking with LRAD  OBJECTIVE:  Note: Objective measures were completed at Evaluation unless otherwise noted.  DIAGNOSTIC FINDINGS:  EXAM: MRI BRAIN WITHOUT CONTRAST 10/27/2023 04:34:27 PM   TECHNIQUE: Multiplanar multisequence MRI of the head/brain was performed without the administration of intravenous contrast.    COMPARISON: MRI of the brain dated 09/10/2000.   CLINICAL HISTORY: Syncope/presyncope, cerebrovascular cause suspected. Syncope; Limited positioning, pt combative.   FINDINGS:   BRAIN AND VENTRICLES: There is a new area of  restricted diffusion present medially within the right cerebellar hemisphere. There is mild cerebellar swelling with partial effacement of the fourth ventricle. There is mild periventricular and deep cerebral white matter disease present.   ORBITS: No acute abnormality.   SINUSES AND MASTOIDS: Polypoidal mucosal disease present within the floors of the maxillary sinuses.   BONES AND SOFT TISSUES: Normal marrow signal. No acute soft tissue abnormality.   IMPRESSION: 1. New area of restricted diffusion medially within the right cerebellar hemisphere with mild cerebellar swelling and partial effacement of the fourth ventricle, consistent with Nonhemorrhagic acute infarct. 2. Mild periventricular and deep cerebral white matter disease.   Electronically signed by: evalene coho 10/27/2023 05:20 PM EDT RP Workstation: [id number]  COGNITION: Overall cognitive status: Pts significant other reports changes in cognition although pt denies any change   SENSATION: Light touch: Impaired  Toes  COORDINATION: Pt demonstrates abnormal finger to nose testing with LUE  DTRs:  Not assessed this date   LOWER EXTREMITY ROM:     Active  Right Eval Left Eval  Hip flexion    Hip extension    Hip abduction    Hip adduction    Hip internal rotation    Hip external rotation    Knee flexion    Knee extension    Ankle dorsiflexion    Ankle plantarflexion    Ankle inversion    Ankle eversion     (Blank rows = not tested)  LOWER EXTREMITY MMT:    MMT Right Eval Left Eval  Hip flexion 4 4  Hip extension    Hip abduction 4 4  Hip adduction 4 4  Hip internal rotation    Hip external rotation    Knee flexion 4 4  Knee extension 4 4  Ankle  dorsiflexion 3+ 3+  Ankle plantarflexion    Ankle inversion    Ankle eversion    (Blank rows = not tested)    STAIRS: Findings: Stair Negotiation Technique: Alternating Pattern  with Single Rail on Left, Number of Stairs: 4, Height of Stairs: 8 inches   , and Comments: pt demonstrates good LE strength with alternating pattern but increased time required GAIT: Findings: Gait Characteristics: decreased stride length, wide BOS, and poor foot clearance- Right, Distance walked: 270 feet, Assistive device utilized:cane held in hand but did not use it and None, and Comments: pt demonstrates consistent early heel strike on RLE and occassional early heel strike on LLE. Pt states it is because of the shoewear, will reassess next visit with pt instructed to wear sneakers next session.   FUNCTIONAL TESTS:  5 times sit to stand: 11.53 seconds, minimal UE support 2 minute walk test: 270 ft SLS 11/15/23: R: 1.5 seconds L: 0.96 seconds  PATIENT SURVEYS:  ABC scale: 930/1600 - 58.1%  TREATMENT DATE:  12/22/2023  Therapeutic Exercise: -Treadmill, 5 minutes, 1.0 speed, grade 2.0, pt cued for UE placement and speed -Heel/toe raises, 2 set of 10 reps, pt cued for increased ROM, bilateral UE support on parallel bars -Leg press, plate 5 2 sets of 8 reps, pt cued for eccentric control and decreased knee locking Therapeutic Activity: -Sled push/pull, 4lb ankle weights, 30lbs on sled, pt cued for speed, less than 30 seconds on 50 foot lap -Ankle dorsiflexions marches, 10 lb kettle bell, 2 sets of 5 reps bilaterally, pt cued for one UE support -Walking marches/lateral stepping with tidal tank column and 4lb ankle weights, 2 laps on 10 foot line, pt cued for increased knee and hip flexion -Speed step ups, 8 inch step, 4lb ankle weights, 30 second bouts, 8.5 reps, 13 reps, pt cued for decreased  UE support    12/15/2023: - [place] UE/LE x74min - [employer] walkout retro 3Pl and sidestep (reports increased Lt foot pain so Dc'd exercise -6 and 12in hurdles intermittent HHA 4RT forward  Sidelying: -Abduction 2x 10 (cueing to reduce ER) Supine: - Bridge on green theraball 2x 10 - Heel slides on theraball 20x Seated - Toe raises 20x   12/13/23: - Heel/toe raises, 2 set of 10 reps, pt cued for increased ROM, bilateral UE support on parallel bars - SLS average 3-5 max of 5 attempts - Tandem stance with one foot on 8in step height 2x 30 no HHA (cueing for posture) - Leg press 5Pl then 6Pl 2x 10  - Step up and overs, 8 inch step, no HHA - Standing hamstring curl - Balance beam with intermittent HHA tandem then sidestep 3RT  - 12in hurdles over balance beam 3RT forward and lateral step over.   PATIENT EDUCATION: Education details: Pt was educated on findings of PT evaluation, prognosis, frequency of therapy visits and rationale, attendance policy, and HEP if given.   Person educated: Patient and Spouse Education method: Explanation, Verbal cues, and Handouts Education comprehension: verbalized understanding, verbal cues required, and needs further education  HOME EXERCISE PROGRAM: Access Code: [email or web address] URL: [email or web address] Date: 11/15/2023 Prepared by: Lang Ada  Exercises - Standing Single Leg Stance with Counter Support  - 1 x daily - 7 x weekly - 1 sets - 3 reps - 30 hold - Sit to Stand with Arms Crossed  - 1 x daily - 7 x weekly - 3 sets - 10 reps - Supine Bridge  - 1 x daily - 7 x weekly - 3 sets - 10 reps - 5 hold  Access Code: [email or web address] URL: [email or web address] Date: 11/18/2023 Prepared by: Lamarr Citrin  Exercises - Heel Toe Raises with Counter Support  - 1 x daily - 7 x weekly - 3 sets - 10 reps  Access Code: [email or web address] URL: [email or web address] Date: 11/23/2023 Prepared by: Augustin Mclean  Exercises - Standing Single Leg Stance with Counter Support  - 1 x daily - 7 x weekly - 1 sets - 3 reps - 30 hold - Sit to Stand with Arms Crossed  - 1 x daily - 7 x weekly - 3 sets - 10 reps - Supine Bridge  - 1 x daily - 7 x weekly - 3 sets - 10 reps - 5 hold - Heel Toe Raises with Counter Support  - 1 x daily - 7 x weekly - 3 sets - 10 reps - Sidelying Hip Abduction  - 2 x daily - 7 x weekly - 3 sets - 10 reps -  5 hold  GOALS: Goals reviewed with patient? Yes  SHORT TERM GOALS: Target date: 12/06/23  Patient will demonstrate evidence of independence with individualized HEP and will report compliance for at least 3 days per week for optimized progression towards remaining therapy goals. Baseline:  Goal status: INITIAL  2.  Patient will report no falls for the next three weeks for demonstration of increased safety and community awareness for decreased risk of injury. Baseline: pt has fallen 6-8 times in the last 6 months Goal status: INITIAL     LONG TERM GOALS: Target date: 12/27/23  Pt will demonstrate a an increase of at least 15 percent on the ABC scale for improved confidence of balance during community ambulation and ADL. Baseline: see objective Goal status: INITIAL  2.  Pt will improve 2 MWT by 40 feet in order to demonstrate improved functional ambulatory capacity in community setting.  Baseline: see objective Goal status: INITIAL  3.  Pt will demonstrate increased MMT grade in RLE to at least a 4+/5, for increased strength during mobility and maximal efficiency of gait cycle during ambulation. Baseline: see objective Goal status: INITIAL  4.  Pt will demonstrate the ability to get up from the floor in supine position to standing with modified independence and self capable strategies. Baseline: pt does not feel like he would be able to complete task at eval Goal status: INITIAL  5.  Pt will improve 5TSTS by 2.3 seconds in order to improve strength during  functional activities. Baseline: see objective Goal status: INITIAL  6. Pt will demonstrate ability to improve SLS to 10 seconds bilaterally for improved balance and decreased fall risk. Baseline: see objective Goal status: INITIAL  ASSESSMENT:  CLINICAL IMPRESSION:  Patient continues to demonstrate improved RLE strength/ROM, decreased gait quality and balance. Patient also demonstrates decreased fair endurance with aerobic based exercise during today's session on treadmill. Patient able to progress dynamic balance and core activation exercises today with resisted walking and tidal tank movements, good performance with verbal cueing. Pts foot was inspected and slight redness and callus present, point tenderness to callus but dry, pt educated on foot care and encouraged to follow up with doctor. Patient would continue to benefit from skilled physical therapy for increased endurance with ambulation, increased LE strength, and improved balance for improved quality of life, improved independence with gait training and continued progress towards therapy goals. Pts last scheduled appointment next week, will plan to discharge to HEP pending meeting majority of goals.    (Eval) Patient is a 73 y.o. male who was seen today for physical therapy evaluation and treatment for I63.9 (ICD-10-CM) - Acute CVA (cerebrovascular accident) (HCC). Patient demonstrates impaired coordination on LUE, decreased LE strength, abnormal pain rating in shoulders, abnormal gait pattern, and impaired balance. Patient also demonstrates difficulty with ambulation during today's session with decreased stride length, consistent early heel strike on RLE, and decreased velocity noted although able to complete with no AD this date. Patient also demonstrates ability to navigate stairs with reciprocal pattern with use of one UE on railing ascending and descending. Patient requires education on role of PT, importance of frequent physical  activity and importance of HEP compliance. Patient would benefit from skilled physical therapy for increased endurance/efficiency with ambulation, increased LE strength, and balance for improved gait quality, return to higher level of function with ADLs, and progress towards therapy goals.   OBJECTIVE IMPAIRMENTS: Abnormal gait, decreased balance, decreased coordination, decreased endurance, decreased mobility, difficulty walking, decreased strength, impaired  sensation, and pain.   ACTIVITY LIMITATIONS: carrying, lifting, bending, squatting, sleeping, transfers, and bed mobility  PARTICIPATION LIMITATIONS: meal prep, cleaning, laundry, driving, shopping, community activity, and yard work  PERSONAL FACTORS: Age, Past/current experiences, Time since onset of injury/illness/exacerbation, and 1-2 comorbidities: heart issues, diabetes are also affecting patient's functional outcome.   REHAB POTENTIAL: Fair neurological in nature  CLINICAL DECISION MAKING: Stable/uncomplicated  EVALUATION COMPLEXITY: Low  PLAN:  PT FREQUENCY: 1-2x/week  PT DURATION: 6 weeks  PLANNED INTERVENTIONS: 97110-Therapeutic exercises, 97530- Therapeutic activity, [id number]- Neuromuscular re-education, 97535- Self Care, 02859- Manual therapy, [id number]- Gait training, Patient/Family education, Balance training, Stair training, and DME instructions  PLAN FOR NEXT SESSION: progress functional mobility (floor to standing transfer), progress gait and balance training, progress LE strengthening especially RLE, encourage HEP.  Next session review Lt foot pain tolerance and add lunges and progress balance as able.  Lang Ada, PT, DPT [facility] Office: [phone] 8:18 AM, 12/22/23

## 2023-12-27 ENCOUNTER — Encounter: Payer: Self-pay | Admitting: Student

## 2023-12-27 ENCOUNTER — Ambulatory Visit: Attending: Student | Admitting: Student

## 2023-12-27 VITALS — BP 116/72 | HR 82 | Ht 67.0 in | Wt 173.3 lb

## 2023-12-27 DIAGNOSIS — Q211 Atrial septal defect, unspecified: Secondary | ICD-10-CM

## 2023-12-27 DIAGNOSIS — I5032 Chronic diastolic (congestive) heart failure: Secondary | ICD-10-CM

## 2023-12-27 DIAGNOSIS — E785 Hyperlipidemia, unspecified: Secondary | ICD-10-CM

## 2023-12-27 DIAGNOSIS — I350 Nonrheumatic aortic (valve) stenosis: Secondary | ICD-10-CM | POA: Diagnosis not present

## 2023-12-27 DIAGNOSIS — I480 Paroxysmal atrial fibrillation: Secondary | ICD-10-CM | POA: Diagnosis not present

## 2023-12-27 DIAGNOSIS — I2510 Atherosclerotic heart disease of native coronary artery without angina pectoris: Secondary | ICD-10-CM | POA: Diagnosis not present

## 2023-12-27 MED ORDER — APIXABAN 5 MG PO TABS
5.0000 mg | ORAL_TABLET | Freq: Two times a day (BID) | ORAL | 3 refills | Status: AC
Start: 2023-12-27 — End: ?

## 2023-12-27 MED ORDER — ATORVASTATIN CALCIUM 80 MG PO TABS: 80.0000 mg | ORAL_TABLET | Freq: Every day | ORAL | 3 refills | Status: AC

## 2023-12-27 MED ORDER — FUROSEMIDE 40 MG PO TABS: 40.0000 mg | ORAL_TABLET | Freq: Every day | ORAL | 3 refills | Status: AC

## 2023-12-27 NOTE — Patient Outreach (Signed)
 Patient returned nurse call and would like to participate in the complex care management program. Unfortunately, he is currently at his Cardiologist for a visit. Informed patient of rescheduled nurse telephone visit scheduled for Thursday, October 30 at 10:30 for a 60 minute call and patient is agreeable.   Clayborne Ly RN BSN CCM [place]  [facility], [facility] Nurse Care Coordinator  Direct Dial: [phone] Website: [email or web address]

## 2023-12-27 NOTE — Patient Instructions (Signed)
 Roth D Grade - I am sorry I was unable to reach you today for our scheduled appointment. I work with Aletha Bene, MD and am calling to support your healthcare needs. Please contact me at [phone] at your earliest convenience. I look forward to speaking with you soon.   Thank you,   Clayborne Ly RN BSN CCM [place]  [facility], [facility] Nurse Care Coordinator  Direct Dial: [phone] Website: [email or web address]

## 2023-12-27 NOTE — Patient Instructions (Addendum)
 Medication Instructions:   Your physician recommends that you continue on your current medications as directed. Please refer to the Current Medication list given to you today.   Labwork: None today  Testing/Procedures: None today  Follow-Up: Keep 02/13/24 appointment with Dr.Branch   Any Other Special Instructions Will Be Listed Below (If Applicable).  If you need a refill on your cardiac medications before your next appointment, please call your pharmacy.

## 2023-12-27 NOTE — Progress Notes (Signed)
 Cardiology Office Note    Date:  12/27/2023  ID:  Jonathan Gilmore, DOB March 19, 1950, MRN [id number] Cardiologist: [name], MD Cardiology APP:  [name], PA-C { :  History of Present Illness:    Jonathan Gilmore is a 73 y.o. male with past medical history of CAD (s/p cath in 07/2015 showed 50% LAD stenosis with overall patent coronary arteries, NST in 02/2021 showing small inferior infarct with no current ischemia), HTN, HLD, aortic stenosis, Type 2 DM, paroxysmal atrial fibrillation, chronic HFpEF and tobacco use who presents to the office today for follow-up.  He was examined by [name], NP in 11/2023 and had recently been admitted for an acute CVA. He did report having fatigue and shortness of breath at the time of his office visit. Given most recent echocardiogram had shown severe AS, he was referred to the Structural Heart Team for further assessment.  He was evaluated by [name] on 12/14/2023 and at that time, it was felt that he was not having significant symptoms and the patient wished to monitor symptoms at that time. It was recommend that if he were to develop symptoms, would need a TEE to further assess his ASD to see if this would be suitable for transcatheter closure. A follow-up echocardiogram was recommended in 3 months and no additional changes were made at that time.  In talking with the patient and his wife today, he reports overall doing well since his last office visit. Says that he has significantly improved since the time of his stroke and balance is back to baseline. He remains active in doing chores around his home and is planning to pressure wash his driveway later today. Also works on cars. He denies any specific chest pain or palpitations  No dyspnea on exertion, orthopnea, PND or pitting edema. He is listed as taking Lasix  as needed but takes this on a daily basis and has been doing so for several months.  Studies Reviewed:   EKG: EKG is not  ordered today.  Echocardiogram: 10/2023 IMPRESSIONS     1. Left ventricular ejection fraction, by estimation, is 65 to 70%. The  left ventricle has normal function. The left ventricle has no regional  wall motion abnormalities. There is moderate concentric left ventricular  hypertrophy. Left ventricular  diastolic parameters are indeterminate.   2. Right ventricular systolic function is normal. The right ventricular  size is normal. Tricuspid regurgitation signal is inadequate for assessing  PA pressure.   3. Left atrial size was moderately dilated.   4. The mitral valve is degenerative. Mild mitral valve regurgitation.   5. The aortic valve has an indeterminant number of cusps. There is severe  calcifcation of the aortic valve. Aortic valve regurgitation is mild.  Severe aortic valve stenosis, paradoxical normal flow/low gradient. Aortic  valve mean gradient measures 20.0  mmHg. Dimentionless index 0.23.   6. The inferior vena cava is normal in size with <50% respiratory  variability, suggesting right atrial pressure of 8 mmHg.   7. Evidence of atrial level shunting detected by color flow Doppler.  There is a moderately sized secundum atrial septal defect with  predominantly left to right shunting across the atrial septum.   Comparison(s): Prior images reviewed side by side. Severe paradoxical  normal flow/low gradient aortic stenosis, mean AV gradient 20 mmHg and DI  0.23.    Physical Exam:   VS:  BP 116/72 (BP Location: Left Arm, Cuff Size: Normal)   Pulse 82  Ht 5' 7 (1.702 m)   Wt 173 lb 4.8 oz (78.6 kg)   SpO2 96%   BMI 27.14 kg/m    Wt Readings from Last 3 Encounters:  12/27/23 173 lb 4.8 oz (78.6 kg)  12/21/23 178 lb (80.7 kg)  12/14/23 178 lb (80.7 kg)     GEN: Well nourished, well developed male appearing in no acute distress NECK: No JVD; No carotid bruits CARDIAC: RRR, 3/6 SEM along RUSB.  RESPIRATORY:  Clear to auscultation without rales, wheezing or  rhonchi  ABDOMEN: Appears non-distended. No obvious abdominal masses. EXTREMITIES: No clubbing or cyanosis. No pitting edema.  Distal pedal pulses are 2+ bilaterally.   Assessment and Plan:   1. Coronary artery disease involving native heart without angina pectoris, unspecified vessel or lesion type - Prior cardiac catheterization in 2017 showed 50% LAD stenosis and most recent ischemic evaluation was an NST in 02/2021 which showed a small inferior infarct with no current ischemia. He remains active at baseline and denies any recent anginal symptoms. - Continue Atorvastatin  80 mg daily and Toprol -XL 50 mg daily. He is no longer on ASA given the need for anticoagulation.  2. Nonrheumatic aortic valve stenosis - Most recent echocardiogram in 10/2023 showed severe aortic valve stenosis with normal flow/low gradient and mean gradient 20 mmHg. He is being followed by Structural Heart with plans for a repeat echocardiogram in 03/2024. We reviewed warning signs to monitor for again today, but at this time he is overall been doing well and denies any symptoms.  3. ASD (atrial septal defect) - He does have an ASD as noted on prior echocardiogram imaging and this is being followed by the Structural Heart team as well with plans for TEE once it is determined he would need intervention to see if this can be addressed via surgical or percutaneous closure.  4. Paroxysmal atrial fibrillation (HCC) - He denies any recent palpitations and is maintaining normal sinus rhythm by examination today. Continue Toprol -XL 50 mg daily for rate control. - Reports he was noncompliant with Eliquis  around the time of his CVA but has been taking this consistently since. Continue Eliquis  5 mg twice daily for anticoagulation which is the correct dose given his current age, weight and renal function (creatinine at 0.61 when checked on 11/02/2023).  5.  Chronic HFpEF - Echocardiogram in 10/2023 showed a preserved EF of 65 to 70%  as outlined above. He denies any respiratory issues and appears euvolemic by examination today. Continue Lasix  40 mg daily. Creatinine was stable at 0.61 when checked in 10/2023.  6. HLD - LDL was at 66 when checked in 10/2023. LFT's WNL when checked in 10/2023. Continue Atorvastatin  80 mg daily.  Disposition: He has previously scheduled follow-up with Dr. Alvan in 02/2024 and wishes to keep that appointment at this time.  Signed, Laymon CHRISTELLA Qua, PA-C

## 2023-12-28 ENCOUNTER — Encounter (INDEPENDENT_AMBULATORY_CARE_PROVIDER_SITE_OTHER): Payer: Self-pay | Admitting: Gastroenterology

## 2023-12-29 ENCOUNTER — Encounter (HOSPITAL_COMMUNITY)

## 2024-01-03 NOTE — Progress Notes (Signed)
 This encounter was created in error - please disregard.

## 2024-01-12 ENCOUNTER — Telehealth: Payer: Self-pay

## 2024-01-12 NOTE — Patient Instructions (Signed)
 Roth D Grade - I am sorry I was unable to reach you today for our scheduled appointment. I work with Aletha Bene, MD and am calling to support your healthcare needs. Please contact me at [phone] at your earliest convenience. I look forward to speaking with you soon.   Thank you,   Clayborne Ly RN BSN CCM [place]  [facility], [facility] Nurse Care Coordinator  Direct Dial: [phone] Website: [email or web address]

## 2024-01-17 ENCOUNTER — Telehealth (HOSPITAL_BASED_OUTPATIENT_CLINIC_OR_DEPARTMENT_OTHER): Payer: Self-pay | Admitting: *Deleted

## 2024-01-17 NOTE — Telephone Encounter (Signed)
   Pre-operative Risk Assessment    Patient Name: Jonathan Gilmore  DOB: March 13, 1951 MRN: [id number]   Date of last office visit: 12/27/23 LAYMON QUA, [facility] Date of next office visit: 02/13/24 DR. BRANCH  Request for Surgical Clearance    Procedure:  Dental Extraction - Amount of Teeth to be Pulled:  4 TEETH SURGICAL EXTRACTIONS  Date of Surgery:  Clearance TBD                                Surgeon:  DR. LYLE DRYER, DMD Surgeon's Group or Practice Name:  [facility]  Phone number:  [phone] Fax number:  [phone]   Type of Clearance Requested:   - Medical  - Pharmacy:  Hold Apixaban  (Eliquis )     Type of Anesthesia:  Local    Additional requests/questions:    Bonney Niels Jest   01/17/2024, 4:31 PM

## 2024-01-18 NOTE — Telephone Encounter (Signed)
   Name: Jonathan Gilmore  DOB: 12/12/1950  MRN: [id number]  Primary Cardiologist: [name], MD  Chart reviewed as part of pre-operative protocol coverage. The patient has an upcoming visit scheduled with Dr. Alvan on 02/13/2024 at which time clearance can be addressed in case there are any issues that would impact surgical recommendations.  Dental extraction of 4 teeth Is not scheduled until TBD as below. I added preop FYI to appointment note so that provider is aware to address at time of outpatient visit.  Per office protocol the cardiology provider should forward their finalized clearance decision and recommendations regarding antiplatelet therapy to the requesting party below.    This message will also be routed to pharmacy pool for input on holding Eliquis  as requested below so that this information is available to the clearing provider at time of patient's appointment.   I will route this message as FYI to requesting party and remove this message from the preop box as separate preop APP input not needed at this time.   Please call with any questions.  Lamarr Satterfield, NP  01/18/2024, 10:22 AM

## 2024-01-18 NOTE — Telephone Encounter (Signed)
 Patient has upcoming appt clearance can be addressed at [facility]

## 2024-01-18 NOTE — Telephone Encounter (Signed)
 Pharmacy please advise on holding Eliquis  prior to extraction of  4 teeth, scheduled for TBD. Last labs 11/02/2023. Has appt with Dr. Alvan on 02/13/2024. Thank you.

## 2024-01-24 ENCOUNTER — Encounter: Payer: Self-pay | Admitting: Cardiology

## 2024-01-25 ENCOUNTER — Telehealth: Payer: Self-pay | Admitting: Cardiology

## 2024-01-25 NOTE — Telephone Encounter (Signed)
 Patient's wife, Eliza Grissinger informed and verbalized understanding of plan.

## 2024-01-25 NOTE — Telephone Encounter (Signed)
 Will route MD question to pharmD. Pharmacy team - please communicate your reply to Dr. DOROTHA Ross when you get a chance. Thank you! Will remain in preop APP inbox in case additional communication needed. I see additional communication outside this message that patient is eager to get this done and Dr. Ross has been doing his best to let them know he is working on this behind the scenes with requested help from [place] team.

## 2024-01-25 NOTE — Telephone Encounter (Signed)
 Per Branch, request is in his in basket that he will take care of and patient does not need an appointment for surgical clearance.

## 2024-01-25 NOTE — Telephone Encounter (Addendum)
 PT's wife Arlean came into the office wondering why we can not just fill out the form for her husband to have his dental work. They are on a time crunch and can not wait to be seen in December. She made it very clear they do not want to see elizabeth either. Arlean is very frustrated and just wants the form filled out so her husband can get his teeth pulled. Arlean is in lobby waiting to speak to someone who can clarify why the form can't just be filled out and hwhy her husband needs to be seen

## 2024-01-26 NOTE — Telephone Encounter (Signed)
     Primary Cardiologist: [name], MD  Chart reviewed as part of pre-operative protocol coverage. Given past medical history and time since last visit, based on ACC/AHA guidelines, Jonathan Gilmore would be at acceptable risk for the planned procedure without further cardiovascular testing.   Patient with diagnosis of atrial fibrillation on Eliquis  for anticoagulation.     Procedure:  Dental Extraction - Amount of Teeth to be Pulled:  4 TEETH SURGICAL EXTRACTIONS   Date of Surgery:  Clearance TBD     CHA2DS2-VASc Score = 7   This indicates a 11.2% annual risk of stroke. The patient's score is based upon: CHF History: 1 HTN History: 1 Diabetes History: 1 Stroke History: 2 Vascular Disease History: 1 Age Score: 1 Gender Score: 0   Patient was off Eliquis  > 1 week in August 2025 and had CVA.     CrCl 120 Platelet count 248   Patient has not had an Afib/aflutter ablation in the last 3 months, DCCV within the last 4 weeks or a watchman implanted in the last 45 days    Patient does not require pre-op antibiotics for dental procedure.   Because patient had been off Eliquis  < 1 month when stroke occurred, and this event was only 3 months ago, would recommend that he be bridged with enoxaparin  prior to the procedure.  If DMD is fine with 1 day hold, then could do without bridge.    I will route this recommendation to the requesting party via Epic fax function and remove from pre-op pool.  Please call with questions.  [name] NP-C     01/26/2024, 4:37 PM [facility] [address] [place], [place] [address] Office [phone]

## 2024-01-26 NOTE — Telephone Encounter (Signed)
 Patient informed and verbalized understanding of plan.

## 2024-01-26 NOTE — Telephone Encounter (Signed)
 Patient with diagnosis of atrial fibrillation on Eliquis  for anticoagulation.    Procedure:  Dental Extraction - Amount of Teeth to be Pulled:  4 TEETH SURGICAL EXTRACTIONS   Date of Surgery:  Clearance TBD   CHA2DS2-VASc Score = 7   This indicates a 11.2% annual risk of stroke. The patient's score is based upon: CHF History: 1 HTN History: 1 Diabetes History: 1 Stroke History: 2 Vascular Disease History: 1 Age Score: 1 Gender Score: 0   Patient was off Eliquis  > 1 week in August 2025 and had CVA.    CrCl 120 Platelet count 248  Patient has not had an Afib/aflutter ablation in the last 3 months, DCCV within the last 4 weeks or a watchman implanted in the last 45 days   Patient does not require pre-op antibiotics for dental procedure.  Because patient had been off Eliquis  < 1 month when stroke occurred, and this event was only 3 months ago, would recommend that he be bridged with enoxaparin  prior to the procedure.  If DMD is fine with 1 day hold, then could do without bridge.    **This guidance is not considered finalized until pre-operative APP has relayed final recommendations.**

## 2024-02-02 ENCOUNTER — Encounter: Payer: Self-pay | Admitting: Orthopedic Surgery

## 2024-02-02 ENCOUNTER — Ambulatory Visit: Admitting: Orthopedic Surgery

## 2024-02-02 DIAGNOSIS — Z981 Arthrodesis status: Secondary | ICD-10-CM

## 2024-02-02 DIAGNOSIS — M25552 Pain in left hip: Secondary | ICD-10-CM

## 2024-02-02 DIAGNOSIS — M12812 Other specific arthropathies, not elsewhere classified, left shoulder: Secondary | ICD-10-CM

## 2024-02-02 DIAGNOSIS — G8929 Other chronic pain: Secondary | ICD-10-CM

## 2024-02-02 DIAGNOSIS — M75102 Unspecified rotator cuff tear or rupture of left shoulder, not specified as traumatic: Secondary | ICD-10-CM

## 2024-02-02 DIAGNOSIS — M7551 Bursitis of right shoulder: Secondary | ICD-10-CM | POA: Diagnosis not present

## 2024-02-02 MED ORDER — METHYLPREDNISOLONE ACETATE 40 MG/ML IJ SUSP
40.0000 mg | Freq: Once | INTRAMUSCULAR | Status: AC
  Administered 2024-02-02: 40 mg via INTRA_ARTICULAR

## 2024-02-02 NOTE — Patient Instructions (Signed)
 Joint Steroid Injection A joint steroid injection is a procedure to relieve swelling and pain in a joint. Steroids are medicines that reduce inflammation. In this procedure, your health care provider uses a syringe and a needle to inject a steroid medicine into a painful and inflamed joint. A pain-relieving medicine (anesthetic) may be injected along with the steroid. In some cases, your health care provider may use an imaging technique such as ultrasound or fluoroscopy to guide the injection. Joints that are often treated with steroid injections include the knee, shoulder, hip, and spine. These injections may also be used in the elbow, ankle, and joints of the hands or feet. You may have joint steroid injections as part of your treatment for inflammation caused by: Gout. Rheumatoid arthritis. Advanced wear-and-tear arthritis (osteoarthritis). Tendinitis. Bursitis. Joint steroid injections may be repeated, but having them too often can damage a joint or the skin over the joint. You should not have joint steroid injections less than 6 weeks apart or more than four times a year. Tell a health care provider about: Any allergies you have. All medicines you are taking, including vitamins, herbs, eye drops, creams, and over-the-counter medicines. Any problems you or family members have had with anesthetic medicines. Any blood disorders you have. Any surgeries you have had. Any medical conditions you have. Whether you are pregnant or may be pregnant. What are the risks? Generally, this is a safe treatment. However, problems may occur, including: Infection. Bleeding. Allergic reactions to medicines. Damage to the joint or tissues around the joint. Thinning of skin or loss of skin color over the joint. Temporary flushing of the face or chest. Temporary increase in pain. Temporary increase in blood sugar. Failure to relieve inflammation or pain. What happens before the treatment? Medicines Ask  your health care provider about: Changing or stopping your regular medicines. This is especially important if you are taking diabetes medicines or blood thinners. Taking medicines such as aspirin and ibuprofen. These medicines can thin your blood. Do not take these medicines unless your health care provider tells you to take them. Taking over-the-counter medicines, vitamins, herbs, and supplements. General instructions You may have imaging tests of your joint. Ask your health care provider if you can drive yourself home after the procedure. What happens during the treatment?  Your health care provider will position you for the injection and locate the injection site over your joint. The skin over the joint will be cleaned with a germ-killing soap. Your health care provider may: Spray a numbing solution (topical anesthetic) over the injection site. Inject a local anesthetic under the skin above your joint. The needle will be placed through your skin into your joint. Your health care provider may use imaging to guide the needle to the right spot for the injection. If imaging is used, a special contrast dye may be injected to confirm that the needle is in the correct location. The steroid medicine will be injected into your joint. Anesthetic may be injected along with the steroid. This may be a medicine that relieves pain for a short time (short-acting anesthetic) or for a longer time (long-acting anesthetic). The needle will be removed, and an adhesive bandage (dressing) will be placed over the injection site. The procedure may vary among health care providers and hospitals. What can I expect after the treatment? You will be able to go home after the treatment. It is normal to feel slight flushing for a few days after the injection. After the treatment, it is  common to have an increase in joint pain after the anesthetic has worn off. This may happen about an hour after a short-acting anesthetic  or about 8 hours after a longer-acting anesthetic. You should begin to feel relief from joint pain and swelling after 24 to 48 hours. Contact your health care provider if you do not begin to feel relief after 2 days. Follow these instructions at home: Injection site care Leave the adhesive dressing over your injection site in place until your health care provider says you can remove it. Check your injection site every day for signs of infection. Check for: More redness, swelling, or pain. Fluid or blood. Warmth. Pus or a bad smell. Activity Return to your normal activities as told by your health care provider. Ask your health care provider what activities are safe for you. You may be asked to limit activities that put stress on the joint for a few days. Do joint exercises as told by your health care provider. Do not take baths, swim, or use a hot tub until your health care provider approves. Ask your health care provider if you may take showers. You may only be allowed to take sponge baths. Managing pain, stiffness, and swelling  If directed, put ice on the joint. To do this: Put ice in a plastic bag. Place a towel between your skin and the bag. Leave the ice on for 20 minutes, 2-3 times a day. Remove the ice if your skin turns bright red. This is very important. If you cannot feel pain, heat, or cold, you have a greater risk of damage to the area. Raise (elevate) your joint above the level of your heart when you are sitting or lying down. General instructions Take over-the-counter and prescription medicines only as told by your health care provider. Do not use any products that contain nicotine or tobacco, such as cigarettes, e-cigarettes, and chewing tobacco. These can delay joint healing. If you need help quitting, ask your health care provider. If you have diabetes, be aware that your blood sugar may be slightly elevated for several days after the injection. Keep all follow-up visits.  This is important. Contact a health care provider if you have: Chills or a fever. Any signs of infection at your injection site. Increased pain or swelling or no relief after 2 days. Summary A joint steroid injection is a treatment to relieve pain and swelling in a joint. Steroids are medicines that reduce inflammation. Your health care provider may add an anesthetic along with the steroid. You may have joint steroid injections as part of your arthritis treatment. Joint steroid injections may be repeated, but having them too often can damage a joint or the skin over the joint. Contact your health care provider if you have a fever, chills, or signs of infection, or if you get no relief from joint pain or swelling. This information is not intended to replace advice given to you by your health care provider. Make sure you discuss any questions you have with your health care provider. Document Revised: 08/10/2019 Document Reviewed: 08/10/2019 Elsevier Patient Education  2024 [employer].

## 2024-02-02 NOTE — Progress Notes (Signed)
    02/02/2024   Chief Complaint  Patient presents with   Injections    R shoulder     Encounter Diagnoses  Name Primary?   Bursitis of right shoulder Yes   Rotator cuff tear arthropathy of left shoulder    Chronic left shoulder pain    Hx of fusion of cervical spine    Mr. Nuzum has a rotator cuff tear with arthritis of the left shoulder on last visit he had bursitis of the right shoulder and received a subacromial injection on October 3  He reports no change in condition at this point  He did not want surgery on his left shoulder and based on his medical history that is probably the best decision  Today he reports that he was blowing some leaves he fell he has got some left hip pain but he is ambulating with a slight limp he would like that checked out later and wants to see if it goes away on its own  His right shoulder is hurting his left shoulder is not  He wants an injection in the right shoulder so we gave a subacromial space injection in the right shoulder   A steroid injection was performed at the right subacromial space using 1% plain Lidocaine  and 40 mg of Depo-Medrol . This was well tolerated.   Past Medical History:  Diagnosis Date   Abnormal cardiovascular stress test 03/2013   Anxiety    Arthritis    Back pain    Cancer (HCC)    Prostate cancer-no treatment per pt   CHF (congestive heart failure) (HCC)    Clotting disorder    Coronary artery disease    Multivessel with significant LAD involvement by chest CT 2014   Depression    Diastolic dysfunction    Essential hypertension    Heart murmur    Lumbar herniated disc    L4-L5   MRSA (methicillin resistant staph aureus) culture positive    Myocardial infarction [facility])    Neuropathy    Shortness of breath dyspnea    Sleep apnea    No cpap   Stroke (HCC) 10/24/2023   Type 2 diabetes mellitus (HCC)

## 2024-02-02 NOTE — Progress Notes (Signed)
    02/02/2024   Chief Complaint  Patient presents with   Injections    R shoulder     No diagnosis found.  What pharmacy do you use ? [facility]  DOI/DOS/ Date:    Did you get better, worse or no change (Answer below)   Unchanged

## 2024-02-13 ENCOUNTER — Encounter: Payer: Self-pay | Admitting: Cardiology

## 2024-02-13 ENCOUNTER — Ambulatory Visit: Attending: Cardiology | Admitting: Cardiology

## 2024-02-13 VITALS — BP 132/64 | HR 75 | Ht 66.5 in | Wt 174.0 lb

## 2024-02-13 DIAGNOSIS — I2510 Atherosclerotic heart disease of native coronary artery without angina pectoris: Secondary | ICD-10-CM | POA: Diagnosis not present

## 2024-02-13 DIAGNOSIS — I350 Nonrheumatic aortic (valve) stenosis: Secondary | ICD-10-CM

## 2024-02-13 DIAGNOSIS — E782 Mixed hyperlipidemia: Secondary | ICD-10-CM | POA: Diagnosis not present

## 2024-02-13 DIAGNOSIS — I10 Essential (primary) hypertension: Secondary | ICD-10-CM | POA: Diagnosis not present

## 2024-02-13 DIAGNOSIS — I4891 Unspecified atrial fibrillation: Secondary | ICD-10-CM

## 2024-02-13 NOTE — Progress Notes (Signed)
 Clinical Summary Jonathan Gilmore is a 73 y.o.male seen today for follow up of the following medical problems.      1. CAD - CT scan chest 02/2013 with 3 vessel CAD, severe LAD disease. - Jan 2015 nuclear stress without clear ischemia - admit 07/2015 with acute pulmonary edema in setting of severe HTN. Referred for cath.  - cath 07/2015 with 50% LAD disease, overall patent coronaries - his presentation with pulmonary edema likely secondary to severe HTN in setting of restrictive diastolic dysfunction. - echo 07/2015 LVEF 45-50%, restrictive diastolic dysfunction 08/2020 echo LVEF 60-65%   02/2021 nuclear stress: small inferior infarct, no current ischemia      - no recent chest pains. No SOB/DOE - doing heavy outside work, no exertional     2.Afib -new diagnosis 05/2022 - recent GI bleeding from AVMs, not started on anticoag at the time   --no recent palpitations -compliant with meds - prior GI bleeding has resolved, last Hgb 15.   - no recent palpitations - no bleeding on eliquis .      3. Severe OSA - not using CPAP machine due to discomfort. Stopped several years ago     4. Hyperlipidemia -11/2018 TC 82 HDL 25 TG 137 LDL 35 - he is compliant with statin   - 08/2019 TC 106 HDL 37 TG 100 LDL 51  - 04/2020 TC 103 HDL 35 TG 143 LDL 45 - 11/2020 TC 87 HDL 35 LDL 38 TG 69   08/2022 TC 96 TG 135 HDL 34 LDL 35 - 10/2023 TC 887 TG 60 HDL 34 LDL 66 - may hae missed some doses of statin prior to last lipid panel, improved compliance he reports recently   4. Aortic stenosis - echo 07/2015 mild to moderate. Mean gradient 16, AVA reported at 0.9 with dimensionless index 0.17 Mar 2018 echo LVEF 60-65%, mild to moderate stenosis   08/2020 echo LVEF 60-65%, no WMAs, grade I dd, mild MR, mild AI, mild to mod AS mean grade 17, AVA VTI 1.12 DI 0.29 - no recent symptoms.    Jan 2024 echo: LVEF 55-60%, moderate AS mean grad 23 AVA VTI 0.90 DI 0.26 SVI 38  - seen by PA Strader, she  discussed with hematology lab results for any form of von Willebrand's disease. Labs show no evidence and therefore no evidence of Heyde syndrome in relation to his AVMs     Jan 2025 echo: LVEF 60-65%, no WMAs, severe paradoxical low flow low gradient AS mean grad 22, AVA VTI 0.80, DI 0.21, SVI 27  10/2023 echo: LVEF 65-70%, no WMAs, normal RV function, AV mean grad 20, AVA VTI 0.73, DI 0.23  - followed by structural heart team. From their evaluation not having significant symptoms, plans to repeat echo Jan 2026 and follow closely cliically     5. HTN - compliant withmeds     6. Chronic diastolic HF - no recent edema - no SOB/DOE   7. DM2  - followed by pcp     8. Claudication/PAD - followed by vascular, last visit 01/2022 - denies significant claudication.    9. Dysphagia - episode of food getting stuck, reports prior issues along with esophageal dilatation in the past. Asking to reestablish with GI, reports also due for colonscopy   10.Neuropathy   11. Prostate cancer - followed by urology   12. GI bleed - followed by GI, issues with AVMs.   13. CVA - admit 10/2023 with  CVA, had been poorly compliant with eliquis  at the time  14. ASD     AAA screen: 2017 US  was normal   SH: his wife, Jonathan Gilmore is also a patient of mine. 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   Past Medical History:  Diagnosis Date   Abnormal cardiovascular stress test 03/2013   Anxiety    Arthritis    Back pain    Cancer (HCC)    Prostate cancer-no treatment per pt   CHF (congestive heart failure) (HCC)    Clotting disorder    Coronary artery disease    Multivessel with significant LAD involvement by chest CT 2014   Depression    Diastolic dysfunction    Essential hypertension    Heart murmur    Lumbar herniated disc    L4-L5   MRSA (methicillin resistant staph aureus) culture positive     Myocardial infarction (HCC)    Neuropathy    Shortness of breath dyspnea    Sleep apnea    No cpap   Stroke (HCC) 10/24/2023   Type 2 diabetes mellitus (HCC)      Allergies  Allergen Reactions   Morphine And Codeine Nausea And Vomiting     Current Outpatient Medications  Medication Sig Dispense Refill   ACCU-CHEK AVIVA PLUS test strip USE AS INSTRUCTED 100 strip 3   apixaban  (ELIQUIS ) 5 MG TABS tablet Take 1 tablet (5 mg total) by mouth 2 (two) times daily. 180 tablet 3   atorvastatin  (LIPITOR ) 80 MG tablet Take 1 tablet (80 mg total) by mouth daily. 90 tablet 3   Calcium -Magnesium -Zinc (CAL-MAG-ZINC PO) Take 1 tablet by mouth daily.     furosemide  (LASIX ) 40 MG tablet Take 1 tablet (40 mg total) by mouth daily. 90 tablet 3   gabapentin  (NEURONTIN ) 300 MG capsule TAKE 1 TO 2 CAPSULES BY MOUTH  TWICE DAILY 360 capsule 3   lisinopril  (ZESTRIL ) 20 MG tablet Take 1 tablet (20 mg total) by mouth daily. 90 tablet 1   metFORMIN  (GLUCOPHAGE ) 500 MG tablet Take 2 tablets (1,000 mg total) by mouth 2 (two) times daily with a meal. 360 tablet 1   metoprolol  succinate (TOPROL -XL) 50 MG 24 hr tablet TAKE 1 TABLET BY MOUTH DAILY 90 tablet 3   Multiple Vitamin (MULTIVITAMIN WITH MINERALS) TABS Take 1 tablet by mouth daily.     nicotine  (NICODERM CQ  - DOSED IN MG/24 HOURS) 21 mg/24hr patch Place 1 patch (21 mg total) onto the skin daily. 28 patch 0   nitroGLYCERIN  (NITROSTAT ) 0.4 MG SL tablet Place 1 tablet (0.4 mg total) under the tongue every 5 (five) minutes x 3 doses as needed for chest pain (if no relief after 2nd dose, proceed to ED for an evaluation). 25 tablet 3   Omega-3 Fatty Acids (OMEGA 3 FISH OIL PO) Take 1 capsule by mouth daily.     pantoprazole  (PROTONIX ) 40 MG tablet Take 1 tablet (40 mg total) by mouth 2 (two) times daily. 180 tablet 3   varenicline  (CHANTIX  CONTINUING MONTH PAK) 1 MG tablet Take 1 tablet (1 mg total) by mouth 2 (two) times daily. Set quit date 60 tablet 2   No  current facility-administered medications for this visit.     Past Surgical History:  Procedure Laterality Date   ANTERIOR CERVICAL DECOMP/DISCECTOMY FUSION  01/18/2011   Procedure: ANTERIOR CERVICAL DECOMPRESSION/DISCECTOMY FUSION 2 LEVELS;  Surgeon: Victory LABOR Pool;  Location: [facility];  Service: Neurosurgery;  Laterality: N/A;  anterior cervical discectomy and fusion with allograft and plating, cervical five-six, cervical six-seven   BIOPSY N/A 04/04/2015   Procedure: BIOPSY;  Surgeon: Claudis RAYMOND Rivet, MD;  Location: [facility];  Service: Endoscopy;  Laterality: N/A;   BIOPSY  11/09/2019   Procedure: BIOPSY;  Surgeon: Eartha Angelia Sieving, MD;  Location: [facility];  Service: Gastroenterology;;   Bone spur removed  1997   Left shoulder   CARDIAC CATHETERIZATION N/A 08/08/2015   Procedure: Right/Left Heart Cath and Coronary Angiography;  Surgeon: Ozell Fell, MD;  Location: [facility];  Service: Cardiovascular;  Laterality: N/A;   CARPAL TUNNEL RELEASE Right    COLONOSCOPY WITH PROPOFOL  N/A 11/09/2019   Procedure: COLONOSCOPY WITH PROPOFOL ;  Surgeon: Eartha Angelia Sieving, MD;  [facility];  Service: Gastroenterology;  Laterality: N/A;  900   COLONOSCOPY WITH PROPOFOL  N/A 02/27/2021   Procedure: COLONOSCOPY WITH PROPOFOL ;  Surgeon: Eartha Angelia Sieving, MD;  [facility];  Service: Gastroenterology;  Laterality: N/A;  7:30   COLONOSCOPY WITH PROPOFOL  N/A 04/05/2022   Procedure: COLONOSCOPY WITH PROPOFOL ;  Surgeon: Shila Gustav GAILS, MD;  Location: [facility];  Service: Gastroenterology;  Laterality: N/A;   ENTEROSCOPY N/A 06/30/2022   Procedure: ENTEROSCOPY;  Surgeon: Eartha Angelia Sieving, MD;  [facility];  Service: Gastroenterology;  Laterality: N/A;  2:00pm;ASA 3   ESOPHAGEAL DILATION N/A 04/04/2015   Procedure: ESOPHAGEAL DILATION;  Surgeon: Claudis RAYMOND Rivet, MD;  [facility];  Service: Endoscopy;   Laterality: N/A;   ESOPHAGEAL DILATION N/A 11/09/2019   Procedure: ESOPHAGEAL DILATION;  Surgeon: Eartha Angelia Sieving, MD;  [facility];  Service: Gastroenterology;  Laterality: N/A;   ESOPHAGOGASTRODUODENOSCOPY N/A 04/04/2015   Procedure: ESOPHAGOGASTRODUODENOSCOPY (EGD);  Surgeon: Claudis RAYMOND Rivet, MD;  [facility];  Service: Endoscopy;  Laterality: N/A;  1240   ESOPHAGOGASTRODUODENOSCOPY (EGD) WITH PROPOFOL  N/A 11/09/2019   Procedure: ESOPHAGOGASTRODUODENOSCOPY (EGD) WITH PROPOFOL ;  Surgeon: Eartha Angelia Sieving, MD;  [facility];  Service: Gastroenterology;  Laterality: N/A;   ESOPHAGOGASTRODUODENOSCOPY (EGD) WITH PROPOFOL  N/A 04/04/2022   Procedure: ESOPHAGOGASTRODUODENOSCOPY (EGD) WITH PROPOFOL ;  Surgeon: Leigh Elspeth SQUIBB, MD;  Location: [facility] ENDOSCOPY;  Service: Gastroenterology;  Laterality: N/A;   ESOPHAGOGASTRODUODENOSCOPY (EGD) WITH PROPOFOL   06/30/2022   Procedure: ESOPHAGOGASTRODUODENOSCOPY (EGD) WITH PROPOFOL ;  Surgeon: Eartha Angelia Sieving, MD;  [facility];  Service: Gastroenterology;;   GIVENS CAPSULE STUDY N/A 05/11/2022   Procedure: GIVENS CAPSULE STUDY;  Surgeon: Eartha Angelia Sieving, MD;  [facility];  Service: Gastroenterology;  Laterality: N/A;  7:30 am   HEMORRHOID SURGERY     HOT HEMOSTASIS  06/30/2022   Procedure: HOT HEMOSTASIS (ARGON PLASMA COAGULATION/BICAP);  Surgeon: Eartha Angelia, Sieving, MD;  [facility];  Service: Gastroenterology;;   POLYPECTOMY  11/09/2019   Procedure: POLYPECTOMY;  Surgeon: Eartha Angelia Sieving, MD;  [facility];  Service: Gastroenterology;;   POLYPECTOMY  02/27/2021   Procedure: POLYPECTOMY;  Surgeon: Eartha Angelia Sieving, MD;  [facility];  Service: Gastroenterology;;   POLYPECTOMY  04/05/2022   Procedure: POLYPECTOMY;  Surgeon: Shila Gustav GAILS, MD;  Location: [facility] ENDOSCOPY;  Service: Gastroenterology;;    TONSILLECTOMY       Allergies  Allergen Reactions   Morphine And Codeine Nausea And Vomiting      Family History  Problem Relation Age of Onset   Lung cancer Mother  Diabetes Father    Hepatitis B Father    Diabetes Sister    Cushing syndrome Sister      Social History Mr. Brickey reports that he has been smoking cigarettes. He started smoking about 56 years ago. He has a 56.2 pack-year smoking history. He has quit using smokeless tobacco. Mr. Uttech reports that he does not currently use alcohol.    Physical Examination Today's Vitals   02/13/24 1111  BP: 132/64  Pulse: 75  SpO2: 97%  Weight: 174 lb (78.9 kg)  Height: 5' 6.5 (1.689 m)   Body mass index is 27.66 kg/m.  Gen: resting comfortably, no acute distress HEENT: no scleral icterus, pupils equal round and reactive, no palptable cervical adenopathy,  CV: RRR, 3/6 systolic murmur rusb, no jvd Resp: Clear to auscultation bilaterally GI: abdomen is soft, non-tender, non-distended, normal bowel sounds, no hepatosplenomegaly MSK: extremities are warm, no edema.  Skin: warm, no rash Neuro:  no focal deficits Psych: appropriate affect   Diagnostic Studies  Jan 2015 MPI IMPRESSION: 1.  Abnormal exercise myocardial perfusion imaging stress test   2. Inferior wall defect likely due to subdiaphragmatic attenuation, its wall motion is similar to other areas without a perfusion defect.   3. Likely small anteroseptal infract, this area is more hypokinetic than the rest of the myocardium   3.  Low left ventricular systolic function with global hypokinesis   4. Mildly reduced functional capacity (90% of age and gender predicted)   5. Overall increased risk for major cardiac events based on low ejection fraction. There is no current myocardium at jeopardy.     02/2013 echo Study Conclusions  - Left ventricle: The cavity size was normal. There was mild   concentric hypertrophy. Systolic function was  normal. The   estimated ejection fraction was in the range of 55% to   60%. Wall motion was normal; there were no regional wall   motion abnormalities. There is mildly asynchronous   contraction consistent with intraventricular conduction   delay (IVCD) or bundle Sharlyne Koeneman block. Doppler parameters   are consistent with abnormal left ventricular relaxation   (grade 1 diastolic dysfunction). - Aortic valve: Cusp separation was mildly reduced. There   was mild to moderate stenosis. - Left atrium: The atrium was moderately dilated.   07/2015 echo   Study Conclusions   - Left ventricle: The cavity size was normal. Wall thickness was   increased in a pattern of mild LVH. Systolic function was mildly   reduced. The estimated ejection fraction was in the range of 45%   to 50%. Diffuse hypokinesis. Doppler parameters are consistent   with restrictive physiology, indicative of decreased left   ventricular diastolic compliance and/or increased left atrial   pressure. Doppler parameters are consistent with high ventricular   filling pressure. - Aortic valve: Valve mobility was restricted. There was mild to   moderate stenosis. Valve area (VTI): 0.96 cm^2. Valve area   (Vmax): 0.91 cm^2. Valve area (Vmean): 0.89 cm^2. - Mitral valve: Calcified annulus. There was mild regurgitation. - Left atrium: The atrium was moderately dilated. - Right atrium: The atrium was mildly dilated. - Atrial septum: There was a patent foramen ovale.   Impressions:   - Mild global reduction in LV function; restrictive filling with   elevated filling pressure; mild LVH; biatrial enlargement;   heavily calcified aortic valve with probable mild to moderate AS   (mean gradient 16 mmHg; calculated AVA .9 cm2 likely   overestimates severity; dimensionless  index .3 not supportive of   severe AS); mild MR; patent foramen ovale.   07/2015 Cath Prox LAD to Mid LAD lesion, 50% stenosed.   1. Calcified moderate proximal-mid  LAD stenosis 2. Widely patent, dominant LCx 3. Preserved cardiac output, mildly elevated right-sided intracardiac pressures   Recommend: Medical therapy for nonobstructive CAD and CHF   01/2016 AAA US  Screen No aneurysm   08/2020 echo   IMPRESSIONS     1. Left ventricular ejection fraction, by estimation, is 60 to 65%. The  left ventricle has normal function. The left ventricle has no regional  wall motion abnormalities. There is moderate left ventricular hypertrophy.  Left ventricular diastolic  parameters are consistent with Grade I diastolic dysfunction (impaired  relaxation). Elevated left atrial pressure. The average left ventricular  global longitudinal strain is -17.6 %.   2. Right ventricular systolic function is normal. The right ventricular  size is normal.   3. The mitral valve is abnormal. Mild mitral valve regurgitation. No  evidence of mitral stenosis.   4. The aortic valve has an indeterminant number of cusps. There is  moderate calcification of the aortic valve. There is moderate thickening  of the aortic valve. Aortic valve regurgitation is mild. Mild to moderate  aortic valve stenosis. Mild to moderate   aortic stenosis is present. Aortic valve mean gradient measures 17.0  mmHg. Aortic valve peak gradient measures 26.7 mmHg. Aortic valve area, by  VTI measures 1.12 cm.    02/2021 nuclear stress  Findings are consistent with prior myocardial infarction. The study is low risk.   1 mm down sloping ST depression in the inferolateral leads was noted with exercise.  Patient did not achieve target HR and was switched to Lexiscan .  There was no significant ST deviation during Lexiscan  infusion.   Left ventricular function is abnormal. Global function is mildly reduced. Nuclear stress EF: 50 %. The left ventricular ejection fraction is mildly decreased (45-54%). End diastolic cavity size is mildly enlarged.   Abnormal myovue with motion and diaphragmatic attenuation  Possible small inferior wall infarct from apex to base No ischemia EF 50% with inferior and apical hypokinesis      Jan 2025 echo 1. Left ventricular ejection fraction, by estimation, is 60 to 65%. The  left ventricle has normal function. The left ventricle has no regional  wall motion abnormalities. There is moderate left ventricular hypertrophy.  Left ventricular diastolic  parameters are indeterminate.   2. Right ventricular systolic function is normal. The right ventricular  size is normal.   3. Left atrial size was moderately dilated.   4. The mitral valve is normal in structure. No evidence of mitral valve  regurgitation. No evidence of mitral stenosis.   5. LVOT possibly underestimated. Data would be consistent with severe  paradoxical low flow low gradient aortic stenosis. Morphologically the  valve looks to be severe. DI 0.21 mean gradient 22, AVA VTI 0.80, SVI 27.  The aortic valve has an indeterminant  number of cusps. There is moderate calcification of the aortic valve.  There is moderate thickening of the aortic valve. Aortic valve  regurgitation is mild. Aortic valve area, by VTI measures 0.80 cm. Aortic  valve mean gradient measures 22.0 mmHg. Aortic   valve Vmax measures 3.05 m/s.   6. Aortic dilatation noted. There is mild dilatation of the ascending  aorta, measuring 36 mm.   7. PFO with moderate left to right shunt.  Assessment and Plan  1. CAD - moderate nonobstructive disease by prior cath.  - recent low risk stress test -denies symptoms, continue current meds     2. Hyperlipidemia -LDL mildly above goal of <55 but some prior mixed compliance with statin, reports taking regularly. Years past had been well controlled at goal - continue current therapy.    3. Aortic stenosis -echo consistent with severe paradoxical low flow low gradient AS - denies symptoms, followed closely in structural clinic - if remains asymptomatic over time may consider  GXT to better objectively evaluate.    4. HTN - bp at goal, continue current meds   5. Chronic diasotlic heart failure -he is euvolemic without symptoms, cntinue curren tmeds    6.Afib - no symptoms, continue current meds incliuding eliquis  for stroke prevention.       Dorn PHEBE Ross, M.D.

## 2024-02-13 NOTE — Patient Instructions (Addendum)
 Medication Instructions:  Continue all current medications.   Labwork: none  Testing/Procedures: none  Follow-Up: 6 months   Any Other Special Instructions Will Be Listed Below (If Applicable).   If you need a refill on your cardiac medications before your next appointment, please call your pharmacy.

## 2024-02-14 ENCOUNTER — Other Ambulatory Visit (INDEPENDENT_AMBULATORY_CARE_PROVIDER_SITE_OTHER): Payer: Self-pay | Admitting: Gastroenterology

## 2024-02-14 DIAGNOSIS — Z8719 Personal history of other diseases of the digestive system: Secondary | ICD-10-CM

## 2024-02-14 DIAGNOSIS — K2100 Gastro-esophageal reflux disease with esophagitis, without bleeding: Secondary | ICD-10-CM

## 2024-02-15 NOTE — Telephone Encounter (Signed)
 Last seen 02/08/2023, Needs office visit.

## 2024-02-17 ENCOUNTER — Telehealth: Payer: Self-pay | Admitting: Physician Assistant

## 2024-02-17 NOTE — Telephone Encounter (Signed)
-----   Message from [facility] F sent at 02/14/2024  7:55 AM EST ----- Regarding: preop clearance I will send this to preop to review if DR. Branch has cleared the pt. I have reviewed his notes but may have missed if he cleared the pt. ----- Message ----- From: Emelia Josefa HERO, NP Sent: 02/14/2024   6:09 AM EST To: Niels HERO Jest, CMA; Cv Div Preop  Routing to preop for review.  Thank you.  Josefa HERO. Cleaver NP-C  [image]   02/14/2024, 6:09 AM [facility] [address] [place], [place] [address] Office [phone] ----- Message ----- From: Alvan Dorn FALCON, MD Sent: 02/13/2024  11:54 AM EST To: Josefa HERO Emelia, NP  Walterine Josefa, can you check that the preop recs were sent to his dentist. Saw them in clnic and they stated the dentist had not received our final recs.    JINNY Alvan MD

## 2024-02-17 NOTE — Telephone Encounter (Signed)
 I am helping in preop today and this message chain was in the Staff Messages box. The clearance was not listed in Dr. Ranae 02/13/24 OV note; the chain of communication can be found under the 01/17/24 Telephone Note that Josefa Beauvais NP had faxed to the dentist office on 01/26/24 at 4:41pm. Moving this communication forward in the chart so it's easier to locate. The clearance response from the APP/MD/pharmacist team was contingent on how long the blood thinner needed to be held. Per the recommendation, Because patient had been off Eliquis  < 1 month when stroke occurred, and this event was only 3 months ago, would recommend that he be bridged with enoxaparin  prior to the procedure.  If DMD is fine with 1 day hold, then could do without bridge. I do not see a specific reply from the dental office to this inquiry.  Callback - please find out from dentist office if they are OK with a 1 day hold which would mean no bridge. But if they feel it needs to be held longer than 1 day, please route back to the P CV DIV PREOP and P CV DIV PREOP PHARM pools and our office will need to coordinate a Lovenox  bridge for the patient. OK to re-fax this recommendation to whatever their confirmed fax number is as well. Thank you!

## 2024-02-20 NOTE — Telephone Encounter (Signed)
 Left message for the dental office to call back to s/w the preop team to discuss recommendations.

## 2024-02-21 NOTE — Telephone Encounter (Signed)
 Note I called the DDS office and was stated to me they were not opening until 1 pm due to inclement weather. I said that will be finer, we will call back later today after 1 pm.        02/21/24 10:09 AM You contacted Dr. Robbi Me    02/20/24 12:06 PM Note Left message for the dental office to call back to s/w the preop team to discuss recommendations.       02/20/24 12:05 PM You attempted to contact Dr. Robbi (Left Message)    02/17/24  7:30 AM Dunn, Raphael SAILOR, PA-C routed this conversation to Cv Div Preop Callback (Selected Message)  Abigail Raphael SAILOR, PA-C    02/17/24  7:30 AM Note I am helping in preop today and this message chain was in the Staff Messages box. The clearance was not listed in Dr. Ranae 02/13/24 OV note; the chain of communication can be found under the 01/17/24 Telephone Note that Josefa Beauvais NP had faxed to the dentist office on 01/26/24 at 4:41pm. Moving this communication forward in the chart so it's easier to locate. The clearance response from the APP/MD/pharmacist team was contingent on how long the blood thinner needed to be held. Per the recommendation, Because patient had been off Eliquis  < 1 month when stroke occurred, and this event was only 3 months ago, would recommend that he be bridged with enoxaparin  prior to the procedure.  If DMD is fine with 1 day hold, then could do without bridge. I do not see a specific reply from the dental office to this inquiry.   Callback - please find out from dentist office if they are OK with a 1 day hold which would mean no bridge. But if they feel it needs to be held longer than 1 day, please route back to the P CV DIV PREOP and P CV DIV PREOP PHARM pools and our office will need to coordinate a Lovenox  bridge for the patient. OK to re-fax this recommendation to whatever their confirmed fax number is as well. Thank you!     Dunn, Dayna N, PA-C    02/17/24  7:23 AM Note ----- Message from CMA Niels F sent at 02/14/2024  7:55  AM EST ----- Regarding: preop clearance I will send this to preop to review if DR. Branch has cleared the pt. I have reviewed his notes but may have missed if he cleared the pt. ----- Message ----- From: Beauvais Josefa HERO, NP Sent: 02/14/2024   6:09 AM EST To: Niels HERO Jest, CMA; Cv Div Preop   Routing to preop for review.  Thank you.   Josefa HERO. Cleaver NP-C   [image]   02/14/2024, 6:09 AM [facility] [address] [place], [place] [address] Office [phone] ----- Message ----- From: Alvan Dorn FALCON, MD Sent: 02/13/2024  11:54 AM EST To: Josefa HERO Beauvais, NP   Walterine Josefa, can you check that the preop recs were sent to his dentist. Saw them in clnic and they stated the dentist had not received our final recs.      JINNY Alvan MD

## 2024-02-21 NOTE — Telephone Encounter (Signed)
 S/w Dr. Robbi, DMD office. I have reviewed notes from Dayna Dunn, [facility] preop APP. I stated that I will fax these notes to discuss with Dr. Robbi, DMD in regard to the recommendations from Dayna Dunn, [facility] and blood thinner hold.   Our preop team will need a reply from Dr. Robbi as to if ok to only hold blood thinner x 1 day or if needs longer the pt will then need Lovenox  bridging. If needs Lovenox  this will need to be facilitated by our pharm-d.

## 2024-02-21 NOTE — Telephone Encounter (Signed)
 I called the [facility] office and was stated to me they were not opening until 1 pm due to inclement weather. I said that will be finer, we will call back later today after 1 pm.

## 2024-02-23 NOTE — Telephone Encounter (Signed)
 Will send a message to the requesting office to see notes from Dayna Dunn, [facility] about blood thinner.

## 2024-02-24 ENCOUNTER — Ambulatory Visit: Admitting: Orthopedic Surgery

## 2024-02-24 ENCOUNTER — Ambulatory Visit: Admitting: Family Medicine

## 2024-02-24 ENCOUNTER — Encounter: Payer: Self-pay | Admitting: Orthopedic Surgery

## 2024-02-24 ENCOUNTER — Encounter: Payer: Self-pay | Admitting: Family Medicine

## 2024-02-24 VITALS — BP 124/72 | HR 78 | Temp 98.2°F | Ht 66.5 in | Wt 181.0 lb

## 2024-02-24 DIAGNOSIS — I5032 Chronic diastolic (congestive) heart failure: Secondary | ICD-10-CM

## 2024-02-24 DIAGNOSIS — Z79899 Other long term (current) drug therapy: Secondary | ICD-10-CM

## 2024-02-24 DIAGNOSIS — F17200 Nicotine dependence, unspecified, uncomplicated: Secondary | ICD-10-CM

## 2024-02-24 DIAGNOSIS — I6523 Occlusion and stenosis of bilateral carotid arteries: Secondary | ICD-10-CM

## 2024-02-24 DIAGNOSIS — M75102 Unspecified rotator cuff tear or rupture of left shoulder, not specified as traumatic: Secondary | ICD-10-CM

## 2024-02-24 DIAGNOSIS — E139 Other specified diabetes mellitus without complications: Secondary | ICD-10-CM | POA: Insufficient documentation

## 2024-02-24 DIAGNOSIS — M12812 Other specific arthropathies, not elsewhere classified, left shoulder: Secondary | ICD-10-CM | POA: Diagnosis not present

## 2024-02-24 DIAGNOSIS — E1169 Type 2 diabetes mellitus with other specified complication: Secondary | ICD-10-CM

## 2024-02-24 DIAGNOSIS — I480 Paroxysmal atrial fibrillation: Secondary | ICD-10-CM

## 2024-02-24 DIAGNOSIS — E782 Mixed hyperlipidemia: Secondary | ICD-10-CM

## 2024-02-24 DIAGNOSIS — I10 Essential (primary) hypertension: Secondary | ICD-10-CM

## 2024-02-24 MED ORDER — METHYLPREDNISOLONE ACETATE 40 MG/ML IJ SUSP
40.0000 mg | Freq: Once | INTRAMUSCULAR | Status: AC
  Administered 2024-02-24: 40 mg via INTRA_ARTICULAR

## 2024-02-24 NOTE — Progress Notes (Signed)
 Chief Complaint  Patient presents with   Injections    Left shoulder    Encounter Diagnosis  Name Primary?   Rotator cuff tear arthropathy of left shoulder Yes    History 73 year old male with a rotator cuff tear diagnosed back in July.  He had an MRI which showed complete tear of his rotator cuff with muscle atrophy and AC joint arthrosis  He requested an injection in his left shoulder.  Procedure note injection subacromial space left shoulder Diagnosis rotator cuff tear left shoulder with shoulder pain Medication Depo-Medrol  40 mg lidocaine  1% 4 cc  Technique: Verbal consent was obtained site was confirmed to complete the timeout  The skin was prepped with alcohol and ethyl chloride  A 21-gauge needle was used to inject the subacromial space from a posterior approach  This was tolerated well without complication  The patient will return on a as needed basis

## 2024-02-24 NOTE — Progress Notes (Signed)
 Patient Office Visit  Assessment & Plan:  Type 2 diabetes mellitus with hyperlipidemia (HCC) -     CBC with Differential/Platelet -     Hemoglobin A1c  Tobacco dependence  Mixed hyperlipidemia -     Lipid panel  Taking a statin medication -     Comprehensive metabolic panel with GFR  Paroxysmal atrial fibrillation (HCC)  Essential hypertension -     CBC with Differential/Platelet -     Comprehensive metabolic panel with GFR  Carotid stenosis, asymptomatic, bilateral -     US  Carotid Bilateral; Future  Chronic diastolic CHF (congestive heart failure) (HCC)   Assessment and Plan    Type 2 diabetes mellitus with mixed hyperlipidemia Diabetes managed with metformin . Lipid management ongoing with cholesterol medication. - Checked A1c today.  Paroxysmal atrial fibrillation Managed with Eliquis . Bruising noted. Blood pressure controlled with metoprolol  and lisinopril . - Continue Eliquis , metoprolol , and lisinopril .  Essential hypertension Blood pressure controlled with metoprolol  and lisinopril . - Continue metoprolol  and lisinopril .  Bilateral carotid artery stenosis Mild stenosis noted in 2018. Previous ultrasound orders not completed. - Ordered carotid ultrasound.  Nicotine  dependence Continues to smoke. Chantix  not well-tolerated. Smoking is a significant risk factor. - Discussed risks of continued smoking.  Cataracts Significant visual impairment, especially at night. Surgery scheduled for February. - Proceed with cataract surgery in February.  Cognitive impairment, mild Increased forgetfulness since stroke. Declined neuropsychological testing. - Continue to monitor cognitive function.  Pulmonary nodule, stable Nodules appear benign on previous CT scan. - Ensure follow-up CT scan is scheduled for next year.  General health maintenance Routine health maintenance discussed. - Ensure eye exam is completed next month. - Ensure colonoscopy is completed in  summer or spring.     Recommend tobacco cessation.  CT of the lung cancer screening due next year.  Carotid ultrasound ordered at any [facility].  Return in 4 months or sooner if necessary.  Return in about 4 months (around 06/24/2024), or if symptoms worsen or fail to improve.   Subjective:    Patient ID: Jonathan Gilmore, male    DOB: 03/08/1951  Age: 73 y.o. MRN: [id number]  Chief Complaint  Patient presents with   Medical Management of Chronic Issues    HPI Discussed the use of AI scribe software for clinical note transcription with the patient, who gave verbal consent to proceed.  History of Present Illness       History of Present Illness Jonathan Gilmore is a 73 year old male who presents for follow-up on multiple health concerns.  He has a history of stroke and has been experiencing memory issues, such as forgetting why he entered a room and where he placed items. These issues have become more apparent since October. He has not pursued further neuropsychological testing.  Thinks it is due to aging and wife believes that it may be worse since the stroke.  He continues to smoke despite previous attempts to quit using Chantix , which he found inconvenient and unpleasant.  He is currently taking several medications including a cholesterol medication, Eliquis , Lasix , gabapentin , lisinopril , metformin , and metoprolol . No issues with metformin  such as nausea or diarrhea.  He has cataracts and experiences difficulty with night driving due to headlight glare. He is scheduled for cataract surgery in February.  He has a history of lung nodules, with a CT scan performed in September showing a nodule in the left lung. Needs repeat next September  He has a history of carotid stenosis, with an  ultrasound ordered in 2022 but not performed. He recalls having an ultrasound of the neck in the past, possibly during a hospital stay in August.  He reports a stable mood, though he acknowledges becoming angry  when provoked. No significant changes in mood or depression.  He follows a diet that includes vegetables and fruits, with occasional small meals. He avoids processed foods and prefers homemade meals. He reports sleeping well and has no current issues with sleep.  Physical Exam VITALS: BP- 124/ CHEST: Crackles present in lungs. CARDIOVASCULAR: Heart murmur present.  Results RADIOLOGY Lung CT: Nodule in left lung and right lung, benign appearance (11/2023)  DIAGNOSTIC Carotid Ultrasound: Mild stenosis of carotid artery (2018)  Assessment and Plan Type 2 diabetes mellitus with mixed hyperlipidemia Diabetes managed with metformin . Lipid management ongoing with cholesterol medication. - Checked A1c today.  Paroxysmal atrial fibrillation/CAD/Heart failure Managed with Eliquis . Bruising noted. Blood pressure controlled with metoprolol  and lisinopril . - Continue Eliquis , metoprolol , and lisinopril . Patient will see cardiology in Feb  Essential hypertension Blood pressure controlled with metoprolol  and lisinopril . - Continue metoprolol  and lisinopril .  Bilateral carotid artery stenosis Mild stenosis noted in 2018. Previous ultrasound orders not completed. - Ordered carotid ultrasound.  Nicotine  dependence Continues to smoke. Chantix  not well-tolerated. Smoking is a significant risk factor. - Discussed risks of continued smoking. Lung cancer screening done this year  Cataracts Significant visual impairment, especially at night. Surgery scheduled for February. - Proceed with cataract surgery in February.  Cognitive impairment, mild Increased forgetfulness since stroke. Declined neuropsychological testing. - Continue to monitor cognitive function.  Pulmonary nodule, stable Nodules appear benign on previous CT scan. - Ensure follow-up CT scan is scheduled for next year.  General health maintenance Routine health maintenance discussed. - Ensure eye exam is completed next  month. - Ensure colonoscopy is completed in summer or spring.    The ASCVD Risk score (Arnett DK, et al., 2019) failed to calculate for the following reasons:   Risk score cannot be calculated because patient has a medical history suggesting prior/existing ASCVD   * - Cholesterol units were assumed  Past Medical History:  Diagnosis Date   Abnormal cardiovascular stress test 03/2013   Anxiety    Arthritis    Back pain    Cancer (HCC)    Prostate cancer-no treatment per pt   CHF (congestive heart failure) (HCC)    Clotting disorder    Coronary artery disease    Multivessel with significant LAD involvement by chest CT 2014   Depression    Diastolic dysfunction    Essential hypertension    Heart murmur    Lumbar herniated disc    L4-L5   MRSA (methicillin resistant staph aureus) culture positive    Myocardial infarction (HCC)    Neuropathy    Shortness of breath dyspnea    Sleep apnea    No cpap   Stroke (HCC) 10/24/2023   Type 2 diabetes mellitus (HCC)    Past Surgical History:  Procedure Laterality Date   ANTERIOR CERVICAL DECOMP/DISCECTOMY FUSION  01/18/2011   Procedure: ANTERIOR CERVICAL DECOMPRESSION/DISCECTOMY FUSION 2 LEVELS;  Surgeon: Victory LABOR Pool;  Location: [facility];  Service: Neurosurgery;  Laterality: N/A;  anterior cervical discectomy and fusion with allograft and plating, cervical five-six, cervical six-seven   BIOPSY N/A 04/04/2015   Procedure: BIOPSY;  Surgeon: Claudis RAYMOND Rivet, MD;  Location: [facility];  Service: Endoscopy;  Laterality: N/A;   BIOPSY  11/09/2019   Procedure: BIOPSY;  Surgeon: Eartha  Angelia Sieving, MD;  Location: [facility];  Service: Gastroenterology;;   Bone spur removed  1997   Left shoulder   CARDIAC CATHETERIZATION N/A 08/08/2015   Procedure: Right/Left Heart Cath and Coronary Angiography;  Surgeon: Ozell Fell, MD;  Location: [facility] INVASIVE CV LAB;  Service: Cardiovascular;  Laterality: N/A;   CARPAL TUNNEL RELEASE Right     COLONOSCOPY WITH PROPOFOL  N/A 11/09/2019   Procedure: COLONOSCOPY WITH PROPOFOL ;  Surgeon: Eartha Angelia Sieving, MD;  Location: [facility];  Service: Gastroenterology;  Laterality: N/A;  900   COLONOSCOPY WITH PROPOFOL  N/A 02/27/2021   Procedure: COLONOSCOPY WITH PROPOFOL ;  Surgeon: Eartha Angelia Sieving, MD;  Location: [facility];  Service: Gastroenterology;  Laterality: N/A;  7:30   COLONOSCOPY WITH PROPOFOL  N/A 04/05/2022   Procedure: COLONOSCOPY WITH PROPOFOL ;  Surgeon: Shila Gustav GAILS, MD;  Location: [facility];  Service: Gastroenterology;  Laterality: N/A;   ENTEROSCOPY N/A 06/30/2022   Procedure: ENTEROSCOPY;  Surgeon: Eartha Angelia Sieving, MD;  Location: [facility];  Service: Gastroenterology;  Laterality: N/A;  2:00pm;ASA 3   ESOPHAGEAL DILATION N/A 04/04/2015   Procedure: ESOPHAGEAL DILATION;  Surgeon: Claudis RAYMOND Rivet, MD;  [facility]: [facility];  Service: Endoscopy;  Laterality: N/A;   ESOPHAGEAL DILATION N/A 11/09/2019   Procedure: ESOPHAGEAL DILATION;  Surgeon: Eartha Angelia Sieving, MD;  [facility];  Service: Gastroenterology;  Laterality: N/A;   ESOPHAGOGASTRODUODENOSCOPY N/A 04/04/2015   Procedure: ESOPHAGOGASTRODUODENOSCOPY (EGD);  Surgeon: Claudis RAYMOND Rivet, MD;  [facility];  Service: Endoscopy;  Laterality: N/A;  1240   ESOPHAGOGASTRODUODENOSCOPY (EGD) WITH PROPOFOL  N/A 11/09/2019   Procedure: ESOPHAGOGASTRODUODENOSCOPY (EGD) WITH PROPOFOL ;  Surgeon: Eartha Angelia Sieving, MD;  [facility];  Service: Gastroenterology;  Laterality: N/A;   ESOPHAGOGASTRODUODENOSCOPY (EGD) WITH PROPOFOL  N/A 04/04/2022   Procedure: ESOPHAGOGASTRODUODENOSCOPY (EGD) WITH PROPOFOL ;  Surgeon: Leigh Elspeth SQUIBB, MD;  Location[facility] ENDOSCOPY;  Service: Gastroenterology;  Laterality: N/A;   ESOPHAGOGASTRODUODENOSCOPY (EGD) WITH PROPOFOL   06/30/2022   Procedure: ESOPHAGOGASTRODUODENOSCOPY (EGD) WITH PROPOFOL ;  Surgeon: Eartha Angelia Sieving, MD;  [facility];  Service: Gastroenterology;;   GIVENS CAPSULE STUDY N/A 05/11/2022   Procedure: GIVENS CAPSULE STUDY;  Surgeon: Eartha Angelia Sieving, MD;  [facility]: [facility];  Service: Gastroenterology;  Laterality: N/A;  7:30 am   HEMORRHOID SURGERY     HOT HEMOSTASIS  06/30/2022   Procedure: HOT HEMOSTASIS (ARGON PLASMA COAGULATION/BICAP);  Surgeon: Eartha Angelia, Sieving, MD;  Location: [facility];  Service: Gastroenterology;;   POLYPECTOMY  11/09/2019   Procedure: POLYPECTOMY;  Surgeon: Eartha Angelia Sieving, MD;  [facility];  Service: Gastroenterology;;   POLYPECTOMY  02/27/2021   Procedure: POLYPECTOMY;  Surgeon: Eartha Angelia Sieving, MD;  [facility]: [facility];  Service: Gastroenterology;;   POLYPECTOMY  04/05/2022   Procedure: POLYPECTOMY;  Surgeon: Shila Gustav GAILS, MD;  Location: [facility] ENDOSCOPY;  Service: Gastroenterology;;   TONSILLECTOMY     Social History[1] Family History  Problem Relation Age of Onset   Lung cancer Mother    Diabetes Father    Hepatitis B Father    Diabetes Sister    Cushing syndrome Sister    Allergies[2]  ROS    Objective:    BP 124/72   Pulse 78   Temp 98.2 F (36.8 C)   Ht 5' 6.5 (1.689 m)   Wt 181 lb (82.1 kg)   SpO2 97%   BMI 28.78 kg/m  BP Readings from Last 3 Encounters:  02/24/24 124/72  02/13/24 132/64  12/27/23 116/72   Wt Readings from Last 3 Encounters:  02/24/24 181 lb (82.1 kg)  02/13/24 174 lb (78.9 kg)  12/27/23 173 lb 4.8 oz (78.6 kg)    Physical Exam Vitals and nursing note reviewed.  Constitutional:      Appearance: Normal appearance.     Comments: Comes in with his wife  HENT:     Head: Normocephalic.     Right Ear: Tympanic membrane, ear canal and external ear normal.     Left Ear: Tympanic membrane, ear canal and external ear normal.  Eyes:     Extraocular Movements: Extraocular movements intact.     Pupils: Pupils are equal, round, and  reactive to light.  Cardiovascular:     Rate and Rhythm: Normal rate and regular rhythm.     Heart sounds: Murmur heard.  Pulmonary:     Effort: Pulmonary effort is normal.     Breath sounds: No wheezing or rhonchi.     Comments: Has crackles at bases Musculoskeletal:     Right lower leg: No edema.     Left lower leg: No edema.  Neurological:     General: No focal deficit present.     Mental Status: He is alert and oriented to person, place, and time.  Psychiatric:        Mood and Affect: Mood normal.        Behavior: Behavior normal.      No results found for any visits on 02/24/24.          [1]  Social History Tobacco Use   Smoking status: Every Day    Current packs/day: 1.00    Average packs/day: 1 pack/day for 56.2 years (56.2 ttl pk-yrs)    Types: Cigarettes    Start date: 11/26/1967   Smokeless tobacco: Former   Tobacco comments:    smoking x 50 yrs  Vaping Use   Vaping status: Never Used  Substance Use Topics   Alcohol use: Not Currently    Comment: rare   Drug use: Yes    Frequency: 5.0 times per week    Types: Marijuana    Comment: as needed  [2]  Allergies Allergen Reactions   Morphine And Codeine Nausea And Vomiting

## 2024-02-25 LAB — LIPID PANEL
Cholesterol: 103 mg/dL (ref <200)
HDL: 36 mg/dL — ABNORMAL LOW (ref >=40)
LDL Cholesterol (Calc): 47 mg/dL
Non-HDL Cholesterol (Calc): 67 mg/dL (ref <130)
Total CHOL/HDL Ratio: 2.9 (calc) (ref <5.0)
Triglycerides: 112 mg/dL (ref <150)

## 2024-02-25 LAB — CBC WITH DIFFERENTIAL/PLATELET
Absolute Lymphocytes: 1762 {cells}/uL (ref 850–3900)
Absolute Monocytes: 564 {cells}/uL (ref 200–950)
Basophils Absolute: 69 {cells}/uL (ref 0–200)
Basophils Relative: 0.7 %
Eosinophils Absolute: 238 {cells}/uL (ref 15–500)
Eosinophils Relative: 2.4 %
HCT: 41.4 % (ref 39.4–51.1)
Hemoglobin: 13.6 g/dL (ref 13.2–17.1)
MCH: 31.1 pg (ref 27.0–33.0)
MCHC: 32.9 g/dL (ref 31.6–35.4)
MCV: 94.7 fL (ref 81.4–101.7)
MPV: 10.2 fL (ref 7.5–12.5)
Monocytes Relative: 5.7 %
Neutro Abs: 7267 {cells}/uL (ref 1500–7800)
Neutrophils Relative %: 73.4 %
Platelets: 226 Thousand/uL (ref 140–400)
RBC: 4.37 Million/uL (ref 4.20–5.80)
RDW: 12.7 % (ref 11.0–15.0)
Total Lymphocyte: 17.8 %
WBC: 9.9 Thousand/uL (ref 3.8–10.8)

## 2024-02-25 LAB — HEMOGLOBIN A1C
Hgb A1c MFr Bld: 7.6 % — ABNORMAL HIGH (ref <5.7)
Mean Plasma Glucose: 171 mg/dL
eAG (mmol/L): 9.5 mmol/L

## 2024-02-25 LAB — COMPREHENSIVE METABOLIC PANEL WITH GFR
AG Ratio: 1.5 (calc) (ref 1.0–2.5)
ALT: 11 U/L (ref 9–46)
AST: 15 U/L (ref 10–35)
Albumin: 3.7 g/dL (ref 3.6–5.1)
Alkaline phosphatase (APISO): 103 U/L (ref 35–144)
BUN: 10 mg/dL (ref 7–25)
CO2: 35 mmol/L — ABNORMAL HIGH (ref 20–32)
Calcium: 9.2 mg/dL (ref 8.6–10.3)
Chloride: 100 mmol/L (ref 98–110)
Creat: 0.78 mg/dL (ref 0.70–1.28)
Globulin: 2.4 g/dL (ref 1.9–3.7)
Glucose, Bld: 206 mg/dL — ABNORMAL HIGH (ref 65–99)
Potassium: 4.1 mmol/L (ref 3.5–5.3)
Sodium: 140 mmol/L (ref 135–146)
Total Bilirubin: 0.8 mg/dL (ref 0.2–1.2)
Total Protein: 6.1 g/dL (ref 6.1–8.1)
eGFR: 94 mL/min/1.73m2 (ref >=60)

## 2024-02-27 ENCOUNTER — Ambulatory Visit: Payer: Self-pay | Admitting: Family Medicine

## 2024-02-27 ENCOUNTER — Other Ambulatory Visit: Payer: Self-pay

## 2024-02-27 ENCOUNTER — Ambulatory Visit: Admitting: Orthopedic Surgery

## 2024-02-27 MED ORDER — EMPAGLIFLOZIN 10 MG PO TABS: 10.0000 mg | ORAL_TABLET | Freq: Every day | ORAL | 1 refills | Status: AC

## 2024-02-27 MED ORDER — EMPAGLIFLOZIN 10 MG PO TABS: 10.0000 mg | ORAL_TABLET | Freq: Every day | ORAL | 3 refills | Status: DC

## 2024-02-27 NOTE — Telephone Encounter (Signed)
 S/w [place]y with Dr. Robbi office. In our conversation Jonathan Gilmore stated Dr. Robbi reviewed our notes and he has now referred the pt out to an oral surgeon DR. Best. I asked Katy if Dr. Benjaman office knew they will need to send a request to our office for preop clearance. She said oral surgeon office will decide if they need clearance and if so they will send us  a request.   At this time we are awaiting to hear from oral surgeon office.   I will update the preop APP as well. In the meant time I will remove from preop call back until we get request from oral surgeon office.

## 2024-03-04 ENCOUNTER — Telehealth

## 2024-03-11 ENCOUNTER — Other Ambulatory Visit: Payer: Self-pay | Admitting: Cardiology

## 2024-03-19 NOTE — Progress Notes (Unsigned)
 "  Patient ID: Jonathan Gilmore MRN: [id number] DOB/AGE: [birth year] 74 y.o.  Primary Care Physician:Aguiar, Rafaela, MD Primary Cardiologist: Branch  CC:  Aortic valvular disease management     FOCUSED PROBLEM LIST:   Aortic stenosis AVA 0.64, DI 0.23, MG 20, V-max 2.7, SVI 21, EF 65 to 70% TTE August 2025 AVA TTE January 2026*** EKG atrial fibrillation without bundle-branch blocks Chronic diastolic heart failure Moderate LVH, PLFLGAS, ASD, EF 65-70% TTE August 2025 Secundum ASD Normal RV size and function TTE August 2025 PAF On Eliquis  Stroke August 2025 AF RVR >> ran out of Eliquis  CAD 50% proximal to mid LAD cath 2017 T2DM Not on insulin  Hypertension Hyperlipidemia Aortic atherosclerosis Chest CT 2025 Tobacco abuse BMI 29/BSA 1.22 December 2023:  Patient consents to use of AI scribe. The patient is a 74 year old male with the above listed medical problems referred for recommendations regarding severe aortic valve disease and a secundum ASD.  The patient was seen recently in the general cardiology clinic.  At that point in time he reported increasing fatigue and shortness of breath but no chest pain, presyncope, syncope, or palpitations.  Given the presence of paradoxical low-flow low gradient aortic stenosis on a recent echocardiogram as well as an ASD he is referred for further recommendations.   He does not typically experience shortness of breath during walking or routine activities, but notes increased fatigue compared to a few years ago. He attributes some limitations to leg pain and blockages in his legs, which he is addressing with physical therapy.  He has a history of atrial fibrillation and is on anticoagulation therapy. He experienced a stroke in August after running out of his medication. He is also diabetic, managed with metformin , and takes medication for high blood pressure and high cholesterol.  He has been smoking one pack per day for 60 years. He  experiences swelling in his feet, which he attributes to diabetic neuropathy. No lightheadedness, blacking out spells, or breathing difficulties when lying flat. He reports feeling perpetually cold for the past two years.  He uses a cane due to a foot issue described as an 'intrusion sticking out on the side' that is extremely tender. He finds it difficult to perform activities that require climbing or getting up from the ground, such as working under a car, due to leg pain and decreased flexibility.  Plan: Given lack of symptoms follow-up in 3 months with another echocardiogram.  January 2026:  Patient consents to use of AI scribe. The patient returns for routine follow-up.  He was seen by Dr. Alvan his primary cardiologist has done well.  He is having no exertional symptoms.  Echocardiogram performed last month demonstrated***    Past Medical History:  Diagnosis Date   Abnormal cardiovascular stress test 03/2013   Anxiety    Arthritis    Back pain    Cancer (HCC)    Prostate cancer-no treatment per pt   CHF (congestive heart failure) (HCC)    Clotting disorder    Coronary artery disease    Multivessel with significant LAD involvement by chest CT 2014   Depression    Diastolic dysfunction    Essential hypertension    Heart murmur    Lumbar herniated disc    L4-L5   MRSA (methicillin resistant staph aureus) culture positive    Myocardial infarction (HCC)    Neuropathy    Shortness of breath dyspnea    Sleep apnea    No cpap   Stroke (  HCC) 10/24/2023   Type 2 diabetes mellitus [facility])     Past Surgical History:  Procedure Laterality Date   ANTERIOR CERVICAL DECOMP/DISCECTOMY FUSION  01/18/2011   Procedure: ANTERIOR CERVICAL DECOMPRESSION/DISCECTOMY FUSION 2 LEVELS;  Surgeon: Victory LABOR Pool;  Location: [facility];  Service: Neurosurgery;  Laterality: N/A;  anterior cervical discectomy and fusion with allograft and plating, cervical five-six, cervical six-seven   BIOPSY N/A  04/04/2015   Procedure: BIOPSY;  Surgeon: Claudis RAYMOND Rivet, MD;  Location: [facility];  Service: Endoscopy;  Laterality: N/A;   BIOPSY  11/09/2019   Procedure: BIOPSY;  Surgeon: Eartha Angelia Sieving, MD;  Location: [facility];  Service: Gastroenterology;;   Bone spur removed  1997   Left shoulder   CARDIAC CATHETERIZATION N/A 08/08/2015   Procedure: Right/Left Heart Cath and Coronary Angiography;  Surgeon: Ozell Fell, MD;  Location: [facility] INVASIVE CV LAB;  Service: Cardiovascular;  Laterality: N/A;   CARPAL TUNNEL RELEASE Right    COLONOSCOPY WITH PROPOFOL  N/A 11/09/2019   Procedure: COLONOSCOPY WITH PROPOFOL ;  Surgeon: Eartha Angelia Sieving, MD;  Location: [facility];  Service: Gastroenterology;  Laterality: N/A;  900   COLONOSCOPY WITH PROPOFOL  N/A 02/27/2021   Procedure: COLONOSCOPY WITH PROPOFOL ;  Surgeon: Eartha Angelia Sieving, MD;  [facility]: [facility];  Service: Gastroenterology;  Laterality: N/A;  7:30   COLONOSCOPY WITH PROPOFOL  N/A 04/05/2022   Procedure: COLONOSCOPY WITH PROPOFOL ;  Surgeon: Shila Gustav GAILS, MD;  Location: [facility] ENDOS[facility];  Service: Gastroenterology;  Laterality: N/A;   ENTEROSCOPY N/A 06/30/2022   Procedure: ENTEROSCOPY;  Surgeon: Eartha Angelia Sieving, MD;  [facility]: [facility];  Service: Gastroenterology;  Laterality: N/A;  2:00pm;ASA 3   ESOPHAGEAL DILATION N/A 04/04/2015   Procedure: ESOPHAGEAL DILATION;  Surgeon: Claudis RAYMOND Rivet, MD;  [facility]: [facility];  Service: Endoscopy;  Laterality: N/A;   ESOPHAGEAL DILATION N/A 11/09/2019   Procedure: ESOPHAGEAL DILATION;  Surgeon: Eartha Angelia Sieving, MD;  [facility];  Service: Gastroenterology;  Laterality: N/A;   ESOPHAGOGASTRODUODENOSCOPY N/A 04/04/2015   Procedure: ESOPHAGOGASTRODUODENOSCOPY (EGD);  Surgeon: Claudis RAYMOND Rivet, MD;  [facility];  Service: Endoscopy;  Laterality: N/A;  1240   ESOPHAGOGASTRODUODENOSCOPY (EGD) WITH PROPOFOL  N/A 11/09/2019    Procedure: ESOPHAGOGASTRODUODENOSCOPY (EGD) WITH PROPOFOL ;  Surgeon: Eartha Angelia Sieving, MD;  [facility];  Service: Gastroenterology;  Laterality: N/A;   ESOPHAGOGASTRODUODENOSCOPY (EGD) WITH PROPOFOL  N/A 04/04/2022   Procedure: ESOPHAGOGASTRODUODENOSCOPY (EGD) WITH PROPOFOL ;  Surgeon: Leigh Elspeth SQUIBB, MD;  Location: [facility] ENDOSCOPY;  Service: Gastroenterology;  Laterality: N/A;   ESOPHAGOGASTRODUODENOSCOPY (EGD) WITH PROPOFOL   06/30/2022   Procedure: ESOPHAGOGASTRODUODENOSCOPY (EGD) WITH PROPOFOL ;  Surgeon: Eartha Angelia Sieving, MD;  [facility];  Service: Gastroenterology;;   GIVENS CAPSULE STUDY N/A 05/11/2022   Procedure: GIVENS CAPSULE STUDY;  Surgeon: Eartha Angelia Sieving, MD;  [facility];  Service: Gastroenterology;  Laterality: N/A;  7:30 am   HEMORRHOID SURGERY     HOT HEMOSTASIS  06/30/2022   Procedure: HOT HEMOSTASIS (ARGON PLASMA COAGULATION/BICAP);  Surgeon: Eartha Angelia, Sieving, MD;  [facility];  Service: Gastroenterology;;   POLYPECTOMY  11/09/2019   Procedure: POLYPECTOMY;  Surgeon: Eartha Angelia Sieving, MD;  [facility];  Service: Gastroenterology;;   POLYPECTOMY  02/27/2021   Procedure: POLYPECTOMY;  Surgeon: Eartha Angelia Sieving, MD;  [facility];  Service: Gastroenterology;;   POLYPECTOMY  04/05/2022   Procedure: POLYPECTOMY;  Surgeon: Shila Gustav GAILS, MD;  Location: [facility]OS[facility];  Service: Gastroenterology;;   TONSILLECTOMY      Family History  Problem Relation Age of Onset   Lung cancer Mother    Diabetes Father    Hepatitis B Father    Diabetes Sister    Cushing syndrome Sister     Social History   Socioeconomic History   Marital status: Married    Spouse name: Not on file   Number of children: Not on file   Years of education: Not on file   Highest education level: Not on file  Occupational History   Not on file  Tobacco Use   Smoking status: Every  Day    Current packs/day: 1.00    Average packs/day: 1 pack/day for 56.3 years (56.3 ttl pk-yrs)    Types: Cigarettes    Start date: 11/26/1967   Smokeless tobacco: Former   Tobacco comments:    smoking x 50 yrs  Vaping Use   Vaping status: Never Used  Substance and Sexual Activity   Alcohol use: Not Currently    Comment: rare   Drug use: Yes    Frequency: 5.0 times per week    Types: Marijuana    Comment: as needed   Sexual activity: Yes  Other Topics Concern   Not on file  Social History Narrative   Lives with wife   Right Handed   Drinks 2-3 cups caffeine daily   Social Drivers of Health   Tobacco Use: High Risk (02/24/2024)   Patient History    Smoking Tobacco Use: Every Day    Smokeless Tobacco Use: Former    Passive Exposure: Not on Actuary Strain: Low Risk (12/21/2023)   Overall Financial Resource Strain (CARDIA)    Difficulty of Paying Living Expenses: Not hard at all  Food Insecurity: No Food Insecurity (12/21/2023)   Epic    Worried About Radiation Protection Practitioner of Food in the Last Year: Never true    Ran Out of Food in the Last Year: Never true  Transportation Needs: No Transportation Needs (12/21/2023)   Epic    Lack of Transportation (Medical): No    Lack of Transportation (Non-Medical): No  Physical Activity: Insufficiently Active (12/21/2023)   Exercise Vital Sign    Days of Exercise per Week: 2 days    Minutes of Exercise per Session: 30 min  Stress: No Stress Concern Present (12/21/2023)   [employer] of Occupational Health - Occupational Stress Questionnaire    Feeling of Stress: Not at all  Social Connections: Unknown (12/21/2023)   Social Connection and Isolation Panel    Frequency of Communication with Friends and Family: More than three times a week    Frequency of Social Gatherings with Friends and Family: More than three times a week    Attends Religious Services: Patient declined    Active Member of Clubs or Organizations: Patient  declined    Attends Banker Meetings: Patient declined    Marital Status: Married  Catering Manager Violence: Not At Risk (12/21/2023)   Epic    Fear of Current or Ex-Partner: No    Emotionally Abused: No    Physically Abused: No    Sexually Abused: No  Depression (PHQ2-9): Low Risk (12/21/2023)   Depression (PHQ2-9)    PHQ-2 Score: 4  Alcohol Screen: Low Risk (12/21/2023)   Alcohol Screen    Last Alcohol Screening Score (AUDIT): 1  Housing: Unknown (12/21/2023)   Epic    Unable to Pay for Housing in the Last Year:  No    Number of Times Moved in the Last Year: Not on file    Homeless in the Last Year: No  Utilities: Not At Risk (12/21/2023)   Epic    Threatened with loss of utilities: No  Health Literacy: Adequate Health Literacy (12/21/2023)   B1300 Health Literacy    Frequency of need for help with medical instructions: Never     Prior to Admission medications   Medication Sig Start Date End Date Taking? Authorizing Provider  ACCU-CHEK AVIVA PLUS test strip USE AS INSTRUCTED 06/13/19   Eldora Olam CROME, MD  apixaban  (ELIQUIS ) 5 MG TABS tablet Take 1 tablet (5 mg total) by mouth 2 (two) times daily. 11/02/23   Levora Reyes SAUNDERS, MD  atorvastatin  (LIPITOR ) 80 MG tablet Take 1 tablet (80 mg total) by mouth daily. 02/17/23   Alvan Dorn FALCON, MD  Calcium -Magnesium -Zinc (CAL-MAG-ZINC PO) Take 1 tablet by mouth daily.    [provider]  furosemide  (LASIX ) 40 MG tablet TAKE 1 TABLET BY MOUTH DAILY AS NEEDED FOR EDEMA(SWELLING) 12/06/23   Miriam Norris, NP  gabapentin  (NEURONTIN ) 300 MG capsule TAKE 1 TO 2 CAPSULES BY MOUTH  TWICE DAILY 06/03/23   Levora Reyes SAUNDERS, MD  lisinopril  (ZESTRIL ) 20 MG tablet Take 1 tablet (20 mg total) by mouth daily. 11/02/23 01/31/24  Levora Reyes SAUNDERS, MD  metFORMIN  (GLUCOPHAGE ) 500 MG tablet Take 2 tablets (1,000 mg total) by mouth 2 (two) times daily with a meal. 11/02/23   Levora Reyes SAUNDERS, MD  metoprolol  succinate (TOPROL -XL) 50 MG 24 hr  tablet TAKE 1 TABLET BY MOUTH DAILY Patient taking differently: Take 50 mg by mouth every evening. 04/21/23   Alvan Dorn FALCON, MD  Multiple Vitamin (MULTIVITAMIN WITH MINERALS) TABS Take 1 tablet by mouth daily.    [provider]  nicotine  (NICODERM CQ  - DOSED IN MG/24 HOURS) 21 mg/24hr patch Place 1 patch (21 mg total) onto the skin daily. 10/30/23   Arrien, Mauricio Daniel, MD  nitroGLYCERIN  (NITROSTAT ) 0.4 MG SL tablet Place 1 tablet (0.4 mg total) under the tongue every 5 (five) minutes x 3 doses as needed for chest pain (if no relief after 2nd dose, proceed to ED for an evaluation). 09/09/23   Alvan Dorn FALCON, MD  Omega-3 Fatty Acids (OMEGA 3 FISH OIL PO) Take 1 capsule by mouth daily.    [provider]  pantoprazole  (PROTONIX ) 40 MG tablet Take 1 tablet (40 mg total) by mouth 2 (two) times daily. 04/27/23   Carlan, Chelsea L, NP  varenicline  (CHANTIX  CONTINUING MONTH PAK) 1 MG tablet Take 1 tablet (1 mg total) by mouth 2 (two) times daily. Set quit date Patient not taking: Reported on 11/30/2023 11/25/23   Aletha Bene, MD    Allergies  Allergen Reactions   Morphine And Codeine Nausea And Vomiting    REVIEW OF SYSTEMS:  General: no fevers/chills/night sweats Eyes: no blurry vision, diplopia, or amaurosis ENT: no sore throat or hearing loss Resp: no cough, wheezing, or hemoptysis CV: no edema or palpitations GI: no abdominal pain, nausea, vomiting, diarrhea, or constipation GU: no dysuria, frequency, or hematuria Skin: no rash Neuro: no headache, numbness, tingling, or weakness of extremities Musculoskeletal: no joint pain or swelling Heme: no bleeding, DVT, or easy bruising Endo: no polydipsia or polyuria  There were no vitals taken for this visit.  PHYSICAL EXAM: GEN:  AO x 3 in no acute distress HEENT: normal Dentition: Normal Neck: JVP normal. +2 carotid upstrokes without bruits. No  thyromegaly. Lungs: equal expansion, clear bilaterally CV: Apex is  discrete and nondisplaced, RRR with 3/6 SEM Abd: soft, non-tender, non-distended; no bruit; positive bowel sounds Ext: no edema, ecchymoses, or cyanosis Vascular: 2+ femoral pulses, 2+ radial pulses       Skin: warm and dry without rash Neuro: CN II-XII grossly intact; motor and sensory grossly intact    DATA AND STUDIES:  EKG: 2025 atrial fibrillation with no bundle-branch blocks  EKG Interpretation Date/Time:    Ventricular Rate:    PR Interval:    QRS Duration:    QT Interval:    QTC Calculation:   R Axis:      Text Interpretation:          CARDIAC STUDIES: Refer to CV Procedures and Imaging Tabs  10/27/2023: TSH 1.615 11/02/2023: Magnesium  1.5 02/24/2024: ALT 11; BUN 10; Creat 0.78; Hemoglobin 13.6; Platelets 226; Potassium 4.1; Sodium 140   STS RISK CALCULATOR: Pending  NYHA CLASS: 1    ASSESSMENT AND PLAN:   1. Nonrheumatic aortic valve stenosis   2. Chronic diastolic heart failure (HCC)   3. ASD (atrial septal defect)   4. Paroxysmal atrial fibrillation (HCC)   5. Hypercoagulable state due to paroxysmal atrial fibrillation (HCC)   6. Type 2 diabetes mellitus without complication, without long-term current use of insulin  (HCC)   7. Hyperlipidemia associated with type 2 diabetes mellitus (HCC)   8. Aortic atherosclerosis   9. Hypertension associated with diabetes (HCC)   10. Cerebrovascular accident (CVA), unspecified mechanism (HCC)   11. BMI 29.0-29.9,adult      Aortic stenosis: The patient has developed paradoxical low-flow low gradient aortic stenosis.  He is not having much by way of symptoms.  We had a long discussion about potential treatment options including watchful waiting versus proceeding with an aortic valve intervention.  The patient would like to monitor his symptoms because he again does not feel badly.  If and when he develops symptoms in an aortic valve intervention is to be pursued this will need to be informed by how his ASD will be  managed.  We will need a TEE to determine whether his ASD is suitable for transcatheter closure.  The options include SAVR plus surgical ASD closure, TAVR plus percutaneous ASD closure, or TAVR plus surgical ASD closure if closure is not deemed feasible by percutaneous techniques.  I will see the patient back in 3 months with another echocardiogram.  He will reach out to us  if he develops increasing shortness of breath, presyncope, exertional chest pain or syncope.*** Chronic diastolic heart failure: Continue lisinopril  20 mg, Toprol  50 mg, Lasix  40 mg as needed, continue Jardiance  10 mg ASD: Will need a TEE once an evaluation for his aortic valve is initiated. PAF: Continue Eliquis  5mg  twice daily, Toprol  50 mg daily Secondary hypercoagulable state: Continue Eliquis  5 mg twice daily T2DM: Continue Eliquis  5 mg twice daily, lisinopril  20 mg, atorvastatin  80 mg, Jardiance  10 mg  Hyperlipidemia: Continue atorvastatin  80 mg  Aortic atherosclerosis: Continue Eliquis  5 mg twice daily, atorvastatin  80 mg  Hypertension: Continue lisinopril  20 mg, Toprol  50 mg.*** History of stroke: Continue Eliquis  5 mg twice daily, atorvastatin  80 mg.   Elevated BMI: Referred to Pharm.D. for GLP-1 therapy.***   I have personally reviewed the patients imaging data as summarized above.  I have reviewed the natural history of aortic stenosis with the patient and family members who are present today. We have discussed the limitations of medical therapy and the poor prognosis  associated with symptomatic aortic stenosis. We have also reviewed potential treatment options, including palliative medical therapy, conventional surgical aortic valve replacement, and transcatheter aortic valve replacement. We discussed treatment options in the context of this patient's specific comorbid medical conditions.   All of the patient's questions were answered today. Will make further recommendations based on the results of studies outlined  above.   I spent *** minutes reviewing all clinical data during and prior to this visit including all relevant imaging studies, laboratories, clinical information from other health systems and prior notes from both Cardiology and other specialties, interviewing the patient, conducting a complete physical examination, and coordinating care in order to formulate a comprehensive and personalized evaluation and treatment plan.   Damond Borchers K San Rua, MD  03/19/2024 4:53 PM    [facility] [address] [place], [place], [place]  [address] Phone: [phone]; Fax: [phone]     "

## 2024-03-20 ENCOUNTER — Ambulatory Visit (HOSPITAL_COMMUNITY): Admission: RE | Admit: 2024-03-20 | Source: Ambulatory Visit

## 2024-03-27 ENCOUNTER — Ambulatory Visit: Admitting: Internal Medicine

## 2024-03-27 NOTE — Progress Notes (Addendum)
 "  Patient ID: Jonathan Gilmore MRN: [id number] DOB/AGE: 05-26-1950 74 y.o.  Primary Care Physician:Aguiar, Rafaela, MD Primary Cardiologist: Branch  CC:  Aortic valvular disease management     FOCUSED PROBLEM LIST:   Aortic stenosis AVA 0.64, DI 0.23, MG 20, V-max 2.7, SVI 21, EF 65 to 70% TTE August 2025 AVA 0.49, DI 0.17 MG 25, V-MAX 3.05, SVI 17, EF 60 to 65% TTE January 2026 EKG atrial fibrillation without bundle-branch blocks Chronic diastolic heart failure Moderate LVH, PLFLGAS, ASD, EF 65-70% TTE August 2025 Secundum ASD Normal RV size and function TTE August 2025 PAF On Eliquis  Stroke August 2025 AF RVR >> ran out of Eliquis  CAD 50% proximal to mid LAD cath 2017 T2DM Not on insulin  Hypertension Hyperlipidemia Aortic atherosclerosis Chest CT 2025 Tobacco abuse BMI 29/BSA 1.22 December 2023:  Patient consents to use of AI scribe. The patient is a 74 year old male with the above listed medical problems referred for recommendations regarding severe aortic valve disease and a secundum ASD.  The patient was seen recently in the general cardiology clinic.  At that point in time he reported increasing fatigue and shortness of breath but no chest pain, presyncope, syncope, or palpitations.  Given the presence of paradoxical low-flow low gradient aortic stenosis on a recent echocardiogram as well as an ASD he is referred for further recommendations.   He does not typically experience shortness of breath during walking or routine activities, but notes increased fatigue compared to a few years ago. He attributes some limitations to leg pain and blockages in his legs, which he is addressing with physical therapy.  He has a history of atrial fibrillation and is on anticoagulation therapy. He experienced a stroke in August after running out of his medication. He is also diabetic, managed with metformin , and takes medication for high blood pressure and high cholesterol.  He has been  smoking one pack per day for 60 years. He experiences swelling in his feet, which he attributes to diabetic neuropathy. No lightheadedness, blacking out spells, or breathing difficulties when lying flat. He reports feeling perpetually cold for the past two years.  He uses a cane due to a foot issue described as an 'intrusion sticking out on the side' that is extremely tender. He finds it difficult to perform activities that require climbing or getting up from the ground, such as working under a car, due to leg pain and decreased flexibility.  Plan: Given lack of symptoms follow-up in 3 months with another echocardiogram.  January 2026:  Patient consents to use of AI scribe. The patient returns for routine follow-up.  He was seen by Dr. Alvan his primary cardiologist has done well.  He is having no exertional symptoms.  Echocardiogram performed today demonstrates paradoxical low-flow low gradient aortic stenosis.  HE has noticed some dyspnea at times but no chest pain.      Past Medical History:  Diagnosis Date   Abnormal cardiovascular stress test 03/2013   Anxiety    Arthritis    Back pain    Cancer (HCC)    Prostate cancer-no treatment per pt   CHF (congestive heart failure) (HCC)    Clotting disorder    Coronary artery disease    Multivessel with significant LAD involvement by chest CT 2014   Depression    Diastolic dysfunction    Essential hypertension    Heart murmur    Lumbar herniated disc    L4-L5   MRSA (methicillin resistant staph aureus)  culture positive    Myocardial infarction [facility])    Neuropathy    Shortness of breath dyspnea    Sleep apnea    No cpap   Stroke (HCC) 10/24/2023   Type 2 diabetes mellitus [facility])     Past Surgical History:  Procedure Laterality Date   ANTERIOR CERVICAL DECOMP/DISCECTOMY FUSION  01/18/2011   Procedure: ANTERIOR CERVICAL DECOMPRESSION/DISCECTOMY FUSION 2 LEVELS;  Surgeon: Victory LABOR Pool;  Location: [facility]UR[facility]S;  Service: Neurosurgery;   Laterality: N/A;  anterior cervical discectomy and fusion with allograft and plating, cervical five-six, cervical six-seven   BIOPSY N/A 04/04/2015   Procedure: BIOPSY;  Surgeon: Claudis RAYMOND Rivet, MD;  Location: [facility];  Service: Endoscopy;  Laterality: N/A;   BIOPSY  11/09/2019   Procedure: BIOPSY;  Surgeon: Eartha Angelia Sieving, MD;  [facility]: [facility];  Service: Gastroenterology;;   Bone spur removed  1997   Left shoulder   CARDIAC CATHETERIZATION N/A 08/08/2015   Procedure: Right/Left Heart Cath and Coronary Angiography;  Surgeon: Ozell Fell, MD;  Location: [facility] INVASIVE CV LAB;  Service: Cardiovascular;  Laterality: N/A;   CARPAL TUNNEL RELEASE Right    COLONOSCOPY WITH PROPOFOL  N/A 11/09/2019   Procedure: COLONOSCOPY WITH PROPOFOL ;  Surgeon: Eartha Angelia Sieving, MD;  [facility];  Service: Gastroenterology;  Laterality: N/A;  900   COLONOSCOPY WITH PROPOFOL  N/A 02/27/2021   Procedure: COLONOSCOPY WITH PROPOFOL ;  Surgeon: Eartha Angelia Sieving, MD;  [facility];  Service: Gastroenterology;  Laterality: N/A;  7:30   COLONOSCOPY WITH PROPOFOL  N/A 04/05/2022   Procedure: COLONOSCOPY WITH PROPOFOL ;  Surgeon: Shila Gustav GAILS, MD;  Location: [facility] ENDOSCOPY;  Service: Gastroenterology;  Laterality: N/A;   ENTEROSCOPY N/A 06/30/2022   Procedure: ENTEROSCOPY;  Surgeon: Eartha Angelia Sieving, MD;  [facility];  Service: Gastroenterology;  Laterality: N/A;  2:00pm;ASA 3   ESOPHAGEAL DILATION N/A 04/04/2015   Procedure: ESOPHAGEAL DILATION;  Surgeon: Claudis RAYMOND Rivet, MD;  [facility];  Service: Endoscopy;  Laterality: N/A;   ESOPHAGEAL DILATION N/A 11/09/2019   Procedure: ESOPHAGEAL DILATION;  Surgeon: Eartha Angelia Sieving, MD;  [facility];  Service: Gastroenterology;  Laterality: N/A;   ESOPHAGOGASTRODUODENOSCOPY N/A 04/04/2015   Procedure: ESOPHAGOGASTRODUODENOSCOPY (EGD);  Surgeon: Claudis RAYMOND Rivet, MD;   [facility];  Service: Endoscopy;  Laterality: N/A;  1240   ESOPHAGOGASTRODUODENOSCOPY (EGD) WITH PROPOFOL  N/A 11/09/2019   Procedure: ESOPHAGOGASTRODUODENOSCOPY (EGD) WITH PROPOFOL ;  Surgeon: Eartha Angelia Sieving, MD;  [facility];  Service: Gastroenterology;  Laterality: N/A;   ESOPHAGOGASTRODUODENOSCOPY (EGD) WITH PROPOFOL  N/A 04/04/2022   Procedure: ESOPHAGOGASTRODUODENOSCOPY (EGD) WITH PROPOFOL ;  Surgeon: Leigh Elspeth SQUIBB, MD;  Location: [facility] ENDOSCOPY;  Service: Gastroenterology;  Laterality: N/A;   ESOPHAGOGASTRODUODENOSCOPY (EGD) WITH PROPOFOL   06/30/2022   Procedure: ESOPHAGOGASTRODUODENOSCOPY (EGD) WITH PROPOFOL ;  Surgeon: Eartha Angelia Sieving, MD;  [facility];  Service: Gastroenterology;;   GIVENS CAPSULE STUDY N/A 05/11/2022   Procedure: GIVENS CAPSULE STUDY;  Surgeon: Eartha Angelia Sieving, MD;  [facility];  Service: Gastroenterology;  Laterality: N/A;  7:30 am   HEMORRHOID SURGERY     HOT HEMOSTASIS  06/30/2022   Procedure: HOT HEMOSTASIS (ARGON PLASMA COAGULATION/BICAP);  Surgeon: Eartha Angelia, Sieving, MD;  [facility];  Service: Gastroenterology;;   POLYPECTOMY  11/09/2019   Procedure: POLYPECTOMY;  Surgeon: Eartha Angelia Sieving, MD;  [facility];  Service: Gastroenterology;;   POLYPECTOMY  02/27/2021   Procedure: POLYPECTOMY;  Surgeon:  Eartha Flavors, Toribio, MD;  [facility];  Service: Gastroenterology;;   POLYPECTOMY  04/05/2022   Procedure: POLYPECTOMY;  Surgeon: Shila Gustav GAILS, MD;  Location[facility] ENDOSCOPY;  Service: Gastroenterology;;   TONSILLECTOMY      Family History  Problem Relation Age of Onset   Lung cancer Mother    Diabetes Father    Hepatitis B Father    Diabetes Sister    Cushing syndrome Sister     Social History   Socioeconomic History   Marital status: Married    Spouse name: Not on file   Number of children: Not on file   Years of  education: Not on file   Highest education level: Not on file  Occupational History   Not on file  Tobacco Use   Smoking status: Every Day    Current packs/day: 1.00    Average packs/day: 1 pack/day for 56.3 years (56.3 ttl pk-yrs)    Types: Cigarettes    Start date: 11/26/1967   Smokeless tobacco: Former   Tobacco comments:    smoking x 50 yrs  Vaping Use   Vaping status: Never Used  Substance and Sexual Activity   Alcohol use: Not Currently    Comment: rare   Drug use: Yes    Frequency: 5.0 times per week    Types: Marijuana    Comment: as needed   Sexual activity: Yes  Other Topics Concern   Not on file  Social History Narrative   Lives with wife   Right Handed   Drinks 2-3 cups caffeine daily   Social Drivers of Health   Tobacco Use: High Risk (03/30/2024)   Patient History    Smoking Tobacco Use: Every Day    Smokeless Tobacco Use: Former    Passive Exposure: Not on Actuary Strain: Low Risk (12/21/2023)   Overall Financial Resource Strain (CARDIA)    Difficulty of Paying Living Expenses: Not hard at all  Food Insecurity: No Food Insecurity (12/21/2023)   Epic    Worried About Radiation Protection Practitioner of Food in the Last Year: Never true    Ran Out of Food in the Last Year: Never true  Transportation Needs: No Transportation Needs (12/21/2023)   Epic    Lack of Transportation (Medical): No    Lack of Transportation (Non-Medical): No  Physical Activity: Insufficiently Active (12/21/2023)   Exercise Vital Sign    Days of Exercise per Week: 2 days    Minutes of Exercise per Session: 30 min  Stress: No Stress Concern Present (12/21/2023)   [employer] of Occupational Health - Occupational Stress Questionnaire    Feeling of Stress: Not at all  Social Connections: Unknown (12/21/2023)   Social Connection and Isolation Panel    Frequency of Communication with Friends and Family: More than three times a week    Frequency of Social Gatherings with Friends and  Family: More than three times a week    Attends Religious Services: Patient declined    Active Member of Clubs or Organizations: Patient declined    Attends Banker Meetings: Patient declined    Marital Status: Married  Catering Manager Violence: Not At Risk (12/21/2023)   Epic    Fear of Current or Ex-Partner: No    Emotionally Abused: No    Physically Abused: No    Sexually Abused: No  Depression (PHQ2-9): Low Risk (12/21/2023)   Depression (PHQ2-9)    PHQ-2 Score: 4  Alcohol Screen: Low Risk (12/21/2023)  Alcohol Screen    Last Alcohol Screening Score (AUDIT): 1  Housing: Unknown (12/21/2023)   Epic    Unable to Pay for Housing in the Last Year: No    Number of Times Moved in the Last Year: Not on file    Homeless in the Last Year: No  Utilities: Not At Risk (12/21/2023)   Epic    Threatened with loss of utilities: No  Health Literacy: Adequate Health Literacy (12/21/2023)   B1300 Health Literacy    Frequency of need for help with medical instructions: Never     Prior to Admission medications   Medication Sig Start Date End Date Taking? Authorizing Provider  ACCU-CHEK AVIVA PLUS test strip USE AS INSTRUCTED 06/13/19   Eldora Olam CROME, MD  apixaban  (ELIQUIS ) 5 MG TABS tablet Take 1 tablet (5 mg total) by mouth 2 (two) times daily. 11/02/23   Levora Reyes SAUNDERS, MD  atorvastatin  (LIPITOR ) 80 MG tablet Take 1 tablet (80 mg total) by mouth daily. 02/17/23   Alvan Dorn FALCON, MD  Calcium -Magnesium -Zinc (CAL-MAG-ZINC PO) Take 1 tablet by mouth daily.    [provider]  furosemide  (LASIX ) 40 MG tablet TAKE 1 TABLET BY MOUTH DAILY AS NEEDED FOR EDEMA(SWELLING) 12/06/23   Miriam Norris, NP  gabapentin  (NEURONTIN ) 300 MG capsule TAKE 1 TO 2 CAPSULES BY MOUTH  TWICE DAILY 06/03/23   Levora Reyes SAUNDERS, MD  lisinopril  (ZESTRIL ) 20 MG tablet Take 1 tablet (20 mg total) by mouth daily. 11/02/23 01/31/24  Levora Reyes SAUNDERS, MD  metFORMIN  (GLUCOPHAGE ) 500 MG tablet Take 2  tablets (1,000 mg total) by mouth 2 (two) times daily with a meal. 11/02/23   Levora Reyes SAUNDERS, MD  metoprolol  succinate (TOPROL -XL) 50 MG 24 hr tablet TAKE 1 TABLET BY MOUTH DAILY Patient taking differently: Take 50 mg by mouth every evening. 04/21/23   Alvan Dorn FALCON, MD  Multiple Vitamin (MULTIVITAMIN WITH MINERALS) TABS Take 1 tablet by mouth daily.    [provider]  nicotine  (NICODERM CQ  - DOSED IN MG/24 HOURS) 21 mg/24hr patch Place 1 patch (21 mg total) onto the skin daily. 10/30/23   Arrien, Mauricio Daniel, MD  nitroGLYCERIN  (NITROSTAT ) 0.4 MG SL tablet Place 1 tablet (0.4 mg total) under the tongue every 5 (five) minutes x 3 doses as needed for chest pain (if no relief after 2nd dose, proceed to ED for an evaluation). 09/09/23   Alvan Dorn FALCON, MD  Omega-3 Fatty Acids (OMEGA 3 FISH OIL PO) Take 1 capsule by mouth daily.    [provider]  pantoprazole  (PROTONIX ) 40 MG tablet Take 1 tablet (40 mg total) by mouth 2 (two) times daily. 04/27/23   Carlan, Chelsea L, NP  varenicline  (CHANTIX  CONTINUING MONTH PAK) 1 MG tablet Take 1 tablet (1 mg total) by mouth 2 (two) times daily. Set quit date Patient not taking: Reported on 11/30/2023 11/25/23   Aletha Bene, MD    Allergies  Allergen Reactions   Morphine And Codeine Nausea And Vomiting    REVIEW OF SYSTEMS:  General: no fevers/chills/night sweats Eyes: no blurry vision, diplopia, or amaurosis ENT: no sore throat or hearing loss Resp: no cough, wheezing, or hemoptysis CV: no edema or palpitations GI: no abdominal pain, nausea, vomiting, diarrhea, or constipation GU: no dysuria, frequency, or hematuria Skin: no rash Neuro: no headache, numbness, tingling, or weakness of extremities Musculoskeletal: no joint pain or swelling Heme: no bleeding, DVT, or easy bruising Endo: no polydipsia or polyuria  BP (!) 108/50  Pulse (!) 54   Resp 14   Ht 5' 7 (1.702 m)   Wt 174 lb (78.9 kg)   SpO2 98%   BMI 27.25  kg/m   PHYSICAL EXAM: GEN:  AO x 3 in no acute distress HEENT: normal Dentition: Normal Neck: JVP normal. +2 carotid upstrokes without bruits. No thyromegaly. Lungs: equal expansion, clear bilaterally CV: Apex is discrete and nondisplaced, RRR with 3/6 SEM Abd: soft, non-tender, non-distended; no bruit; positive bowel sounds Ext: no edema, ecchymoses, or cyanosis Vascular: 2+ femoral pulses, 2+ radial pulses       Skin: warm and dry without rash Neuro: CN II-XII grossly intact; motor and sensory grossly intact    DATA AND STUDIES:  EKG: 2025 atrial fibrillation with no bundle-branch blocks  EKG Interpretation Date/Time:    Ventricular Rate:    PR Interval:    QRS Duration:    QT Interval:    QTC Calculation:   R Axis:      Text Interpretation:          CARDIAC STUDIES: Refer to CV Procedures and Imaging Tabs  10/27/2023: TSH 1.615 11/02/2023: Magnesium  1.5 02/24/2024: ALT 11; BUN 10; Creat 0.78; Hemoglobin 13.6; Platelets 226; Potassium 4.1; Sodium 140   STS RISK CALCULATOR: Pending  NYHA CLASS: 1    ASSESSMENT AND PLAN:   1. Nonrheumatic aortic valve stenosis   2. ASD (atrial septal defect)   3. Chronic diastolic heart failure (HCC)   4. Paroxysmal atrial fibrillation (HCC)   5. Hypercoagulable state due to paroxysmal atrial fibrillation (HCC)   6. Type 2 diabetes mellitus without complication, without long-term current use of insulin  (HCC)   7. Hyperlipidemia associated with type 2 diabetes mellitus (HCC)   8. Aortic atherosclerosis   9. Hypertension associated with diabetes (HCC)   10. Cerebrovascular accident (CVA), unspecified mechanism (HCC)   11. BMI 29.0-29.9,adult       Aortic stenosis: The patient has developed paradoxical low-flow low gradient aortic stenosis.  He has developed NYHA class II symptoms of dyspnea and fatigue.  I will refer the patient for coronary angiography, right heart catheterization, CTA, and a cardiothoracic surgical  opinion.  We will also obtain TEE to determine whether his ASD is suitable for transcatheter closure.  The options include SAVR plus surgical ASD closure or TAVR plus percutaneous ASD closure.  If his ASD anatomy is not feasible for percutaneous closure then I think surgical intervention for his aortic stenosis and ASD should be pursued. ASD: I reviewed the patient's last echocardiogram from August that suggest an ASD.  A bubble study echocardiogram was not performed.  I would like to perform a TEE to determine whether the patient has an anatomy amenable to transcatheter closure.  If not then I think treatment should move towards surgical aortic valve replacement with ASD closure Chronic diastolic heart failure: Continue lisinopril  20 mg, Toprol  50 mg, Lasix  40 mg as needed, continue Jardiance  10 mg  PAF: Continue Eliquis  5mg  twice daily, Toprol  50 mg daily Secondary hypercoagulable state: Continue Eliquis  5 mg twice daily T2DM: Continue Eliquis  5 mg twice daily, lisinopril  20 mg, atorvastatin  80 mg, Jardiance  10 mg  Hyperlipidemia: Continue atorvastatin  80 mg  Aortic atherosclerosis: Continue Eliquis  5 mg twice daily, atorvastatin  80 mg  Hypertension: Continue lisinopril  20 mg, Toprol  50 mg.  Blood pressure is well-controlled today. History of stroke: Continue Eliquis  5 mg twice daily, atorvastatin  80 mg.   Elevated BMI: Consider GLP in the future.  Pre Surgical Assessment: 5  M Walk Test  30M=16.79ft  5 Meter Walk Test- trial 1: 6.43 seconds 5 Meter Walk Test- trial 2: 4.85 seconds 5 Meter Walk Test- trial 3: 5.79 seconds 5 Meter Walk Test Average: 5.69 seconds  I have personally reviewed the patients imaging data as summarized above.  I have reviewed the natural history of aortic stenosis with the patient and family members who are present today. We have discussed the limitations of medical therapy and the poor prognosis associated with symptomatic aortic stenosis. We have also reviewed  potential treatment options, including palliative medical therapy, conventional surgical aortic valve replacement, and transcatheter aortic valve replacement. We discussed treatment options in the context of this patient's specific comorbid medical conditions.   All of the patient's questions were answered today. Will make further recommendations based on the results of studies outlined above.   I spent 38 minutes reviewing all clinical data during and prior to this visit including all relevant imaging studies, laboratories, clinical information from other health systems and prior notes from both Cardiology and other specialties, interviewing the patient, conducting a complete physical examination, and coordinating care in order to formulate a comprehensive and personalized evaluation and treatment plan.   Willadene Mounsey K Umberto Pavek, MD  03/30/2024 9:53 AM    [facility] [address] [place], [place], [place]  [address] Phone: [phone]; Fax: [phone]     "

## 2024-03-28 ENCOUNTER — Other Ambulatory Visit

## 2024-03-29 ENCOUNTER — Ambulatory Visit: Payer: Self-pay | Admitting: Internal Medicine

## 2024-03-29 ENCOUNTER — Ambulatory Visit (HOSPITAL_COMMUNITY)
Admission: RE | Admit: 2024-03-29 | Discharge: 2024-03-29 | Disposition: A | Discharge status: Home / Self Care | Source: Ambulatory Visit | Attending: Internal Medicine | Admitting: Internal Medicine

## 2024-03-29 DIAGNOSIS — I350 Nonrheumatic aortic (valve) stenosis: Secondary | ICD-10-CM | POA: Diagnosis not present

## 2024-03-29 DIAGNOSIS — Q211 Atrial septal defect, unspecified: Secondary | ICD-10-CM | POA: Insufficient documentation

## 2024-03-29 DIAGNOSIS — I480 Paroxysmal atrial fibrillation: Secondary | ICD-10-CM | POA: Insufficient documentation

## 2024-03-29 DIAGNOSIS — I5032 Chronic diastolic (congestive) heart failure: Secondary | ICD-10-CM | POA: Insufficient documentation

## 2024-03-29 LAB — ECHOCARDIOGRAM COMPLETE
AR max vel: 0.62 cm2
AV Area VTI: 0.49 cm2
AV Area mean vel: 0.59 cm2
AV Mean grad: 25 mmHg
AV Peak grad: 37.3 mmHg
Ao pk vel: 3.05 m/s
Area-P 1/2: 5.27 cm2
MV VTI: 1.29 cm2
S' Lateral: 2.9 cm

## 2024-03-29 NOTE — Progress Notes (Signed)
*  PRELIMINARY RESULTS* Echocardiogram 2D Echocardiogram has been performed.  Jonathan Gilmore 03/29/2024, 4:05 PM

## 2024-03-30 ENCOUNTER — Other Ambulatory Visit: Payer: Self-pay

## 2024-03-30 ENCOUNTER — Ambulatory Visit: Attending: Internal Medicine | Admitting: Internal Medicine

## 2024-03-30 ENCOUNTER — Other Ambulatory Visit (HOSPITAL_COMMUNITY)
Admission: RE | Admit: 2024-03-30 | Discharge: 2024-03-30 | Disposition: A | Discharge status: Home / Self Care | Source: Ambulatory Visit | Attending: Urology | Admitting: Urology

## 2024-03-30 ENCOUNTER — Other Ambulatory Visit (HOSPITAL_COMMUNITY)
Admission: RE | Admit: 2024-03-30 | Discharge: 2024-03-30 | Disposition: A | Discharge status: Home / Self Care | Source: Ambulatory Visit | Attending: Internal Medicine | Admitting: Internal Medicine

## 2024-03-30 ENCOUNTER — Encounter: Payer: Self-pay | Admitting: Internal Medicine

## 2024-03-30 VITALS — BP 108/50 | HR 54 | Resp 14 | Ht 67.0 in | Wt 174.0 lb

## 2024-03-30 DIAGNOSIS — I5032 Chronic diastolic (congestive) heart failure: Secondary | ICD-10-CM | POA: Insufficient documentation

## 2024-03-30 DIAGNOSIS — E1169 Type 2 diabetes mellitus with other specified complication: Secondary | ICD-10-CM | POA: Diagnosis not present

## 2024-03-30 DIAGNOSIS — I480 Paroxysmal atrial fibrillation: Secondary | ICD-10-CM | POA: Insufficient documentation

## 2024-03-30 DIAGNOSIS — I152 Hypertension secondary to endocrine disorders: Secondary | ICD-10-CM | POA: Diagnosis not present

## 2024-03-30 DIAGNOSIS — C61 Malignant neoplasm of prostate: Secondary | ICD-10-CM

## 2024-03-30 DIAGNOSIS — E1159 Type 2 diabetes mellitus with other circulatory complications: Secondary | ICD-10-CM | POA: Diagnosis not present

## 2024-03-30 DIAGNOSIS — E119 Type 2 diabetes mellitus without complications: Secondary | ICD-10-CM

## 2024-03-30 DIAGNOSIS — I700 Atherosclerosis of aorta: Secondary | ICD-10-CM

## 2024-03-30 DIAGNOSIS — D6869 Other thrombophilia: Secondary | ICD-10-CM | POA: Diagnosis not present

## 2024-03-30 DIAGNOSIS — E785 Hyperlipidemia, unspecified: Secondary | ICD-10-CM | POA: Diagnosis not present

## 2024-03-30 DIAGNOSIS — I350 Nonrheumatic aortic (valve) stenosis: Secondary | ICD-10-CM

## 2024-03-30 DIAGNOSIS — I639 Cerebral infarction, unspecified: Secondary | ICD-10-CM | POA: Insufficient documentation

## 2024-03-30 DIAGNOSIS — Q211 Atrial septal defect, unspecified: Secondary | ICD-10-CM | POA: Diagnosis present

## 2024-03-30 DIAGNOSIS — Z6829 Body mass index (BMI) 29.0-29.9, adult: Secondary | ICD-10-CM

## 2024-03-30 LAB — CBC
HCT: 43.9 % (ref 39.0–52.0)
Hemoglobin: 14.1 g/dL (ref 13.0–17.0)
MCH: 30.7 pg (ref 26.0–34.0)
MCHC: 32.1 g/dL (ref 30.0–36.0)
MCV: 95.4 fL (ref 80.0–100.0)
Platelets: 250 K/uL (ref 150–400)
RBC: 4.6 MIL/uL (ref 4.22–5.81)
RDW: 13.9 % (ref 11.5–15.5)
WBC: 11.5 K/uL — ABNORMAL HIGH (ref 4.0–10.5)
nRBC: 0 % (ref 0.0–0.2)

## 2024-03-30 LAB — BASIC METABOLIC PANEL WITH GFR
Anion gap: 11 (ref 5–15)
BUN: 18 mg/dL (ref 8–23)
CO2: 29 mmol/L (ref 22–32)
Calcium: 9.1 mg/dL (ref 8.9–10.3)
Chloride: 98 mmol/L (ref 98–111)
Creatinine, Ser: 0.72 mg/dL (ref 0.61–1.24)
GFR, Estimated: >60 mL/min
Glucose, Bld: 252 mg/dL — ABNORMAL HIGH (ref 70–99)
Potassium: 4.1 mmol/L (ref 3.5–5.1)
Sodium: 138 mmol/L (ref 135–145)

## 2024-03-30 LAB — PSA: Prostatic Specific Antigen: 3.68 ng/mL (ref 0.00–4.00)

## 2024-03-30 NOTE — Patient Instructions (Signed)
 " Lab Work: CBC AND BMP  If you have labs (blood work) drawn today and your tests are completely normal, you will receive your results only by: MyChart Message (if you have MyChart) OR A paper copy in the mail If you have any lab test that is abnormal or we need to change your treatment, we will call you to review the results.  Testing/Procedures:  [place] [facility] A DEPT OF [place].  [facility] AT [address] A DEPT OF THE [place]. [facility] [address] [place] [address] Dept: [phone] Loc: [phone]  Jonathan Gilmore  03/30/2024  You are scheduled for a TEE and Cardiac Catheterization on Thursday, February 5 with Dr. Darryle Decent and Dr. Lurena Red.  1. Please arrive at the [facility] (Main Entrance A) at [facility]: [address] [place], [place] [address] at 6:30 AM (This time is 1 hour(s) before your procedure to ensure your preparation).   Free valet parking service is available. You will check in at ADMITTING. The support person will be asked to wait in the waiting room.  It is OK to have someone drop you off and come back when you are ready to be discharged.    Special note: Every effort is made to have your procedure done on time. Please understand that emergencies sometimes delay scheduled procedures.  2. Diet: NPO: Nothing to eat OR drink after midnight. (For TEE and Cath the same day)   3. Hydration: Nothing to eat and drink after midnight. (For TEE and Cath the same day)  4. Labs: You will need to have blood drawn on Friday, January 16 at [facility] (1st Floor), [address], [place], [place] [address]. You do not need to be fasting.  5. Medication instructions in preparation for your procedure:   Contrast Allergy: No   Current Outpatient Medications (Endocrine & Metabolic):    empagliflozin  (JARDIANCE ) 10 MG TABS tablet, Take 1 tablet (10 mg total) by mouth  daily.   metFORMIN  (GLUCOPHAGE ) 500 MG tablet, Take 2 tablets (1,000 mg total) by mouth 2 (two) times daily with a meal.  Current Outpatient Medications (Cardiovascular):    atorvastatin  (LIPITOR ) 80 MG tablet, Take 1 tablet (80 mg total) by mouth daily.   furosemide  (LASIX ) 40 MG tablet, Take 1 tablet (40 mg total) by mouth daily.   lisinopril  (ZESTRIL ) 20 MG tablet, Take 1 tablet (20 mg total) by mouth daily.   metoprolol  succinate (TOPROL -XL) 50 MG 24 hr tablet, TAKE 1 TABLET BY MOUTH DAILY   nitroGLYCERIN  (NITROSTAT ) 0.4 MG SL tablet, Place 1 tablet (0.4 mg total) under the tongue every 5 (five) minutes x 3 doses as needed for chest pain (if no relief after 2nd dose, proceed to ED for an evaluation).  Current Outpatient Medications (Hematological):    apixaban  (ELIQUIS ) 5 MG TABS tablet, Take 1 tablet (5 mg total) by mouth 2 (two) times daily.  Current Outpatient Medications (Other):    ACCU-CHEK AVIVA PLUS test strip, USE AS INSTRUCTED   Calcium -Magnesium -Zinc (CAL-MAG-ZINC PO), Take 1 tablet by mouth daily.   gabapentin  (NEURONTIN ) 300 MG capsule, TAKE 1 TO 2 CAPSULES BY MOUTH  TWICE DAILY   Multiple Vitamin (MULTIVITAMIN WITH MINERALS) TABS, Take 1 tablet by mouth daily.   Omega-3 Fatty Acids (OMEGA 3 FISH OIL PO), Take 1 capsule by mouth daily.   pantoprazole  (PROTONIX ) 40 MG tablet, Take 1 tablet (40  mg total) by mouth 2 (two) times daily. *For reference purposes while preparing patient instructions.   Delete this med list prior to printing instructions for patient.*  Stop taking Eliquis  (Apixiban) on Tuesday, February 3.   Do not take Diabetes Med Glucophage  (Metformin ) on the day of the procedure and HOLD 48 HOURS AFTER THE PROCEDURE.  On the morning of your procedure, take your Aspirin  81 mg and any morning medicines NOT listed above.  You may use sips of water .  6. Plan to go home the same day, you will only stay overnight if medically necessary. 7. Bring a current list of  your medications and current insurance cards. 8. You MUST have a responsible person to drive you home. 9. Someone MUST be with you the first 24 hours after you arrive home or your discharge will be delayed. 10. Please wear clothes that are easy to get on and off and wear slip-on shoes.  Thank you for allowing us  to care for you!   -- [facility]   Follow-Up: At [facility], you and your health needs are our priority.  As part of our continuing mission to provide you with exceptional heart care, our providers are all part of one team.  This team includes your primary Cardiologist (physician) and Advanced Practice Providers or APPs (Physician Assistants and Nurse Practitioners) who all work together to provide you with the care you need, when you need it.   Other Instructions          "

## 2024-03-31 ENCOUNTER — Ambulatory Visit: Payer: Self-pay | Admitting: Internal Medicine

## 2024-04-01 ENCOUNTER — Other Ambulatory Visit: Payer: Self-pay | Admitting: Family Medicine

## 2024-04-01 DIAGNOSIS — E1159 Type 2 diabetes mellitus with other circulatory complications: Secondary | ICD-10-CM

## 2024-04-04 ENCOUNTER — Ambulatory Visit: Admitting: Urology

## 2024-04-08 ENCOUNTER — Other Ambulatory Visit: Payer: Self-pay | Admitting: Family Medicine

## 2024-04-08 ENCOUNTER — Other Ambulatory Visit (INDEPENDENT_AMBULATORY_CARE_PROVIDER_SITE_OTHER): Payer: Self-pay | Admitting: Gastroenterology

## 2024-04-08 DIAGNOSIS — I10 Essential (primary) hypertension: Secondary | ICD-10-CM

## 2024-04-08 DIAGNOSIS — K2100 Gastro-esophageal reflux disease with esophagitis, without bleeding: Secondary | ICD-10-CM

## 2024-04-08 DIAGNOSIS — M5116 Intervertebral disc disorders with radiculopathy, lumbar region: Secondary | ICD-10-CM

## 2024-04-08 DIAGNOSIS — E1159 Type 2 diabetes mellitus with other circulatory complications: Secondary | ICD-10-CM

## 2024-04-08 DIAGNOSIS — Z8719 Personal history of other diseases of the digestive system: Secondary | ICD-10-CM

## 2024-04-10 NOTE — Telephone Encounter (Signed)
 Must keep upcoming appointment with Dr. Cinderella to get further refills in 04/2024 last seen 2024.

## 2024-04-12 ENCOUNTER — Telehealth: Payer: Self-pay | Admitting: *Deleted

## 2024-04-12 ENCOUNTER — Ambulatory Visit: Admitting: Orthopedic Surgery

## 2024-04-12 DIAGNOSIS — G8929 Other chronic pain: Secondary | ICD-10-CM

## 2024-04-12 DIAGNOSIS — M25512 Pain in left shoulder: Secondary | ICD-10-CM

## 2024-04-12 DIAGNOSIS — M12812 Other specific arthropathies, not elsewhere classified, left shoulder: Secondary | ICD-10-CM | POA: Diagnosis not present

## 2024-04-12 DIAGNOSIS — M75102 Unspecified rotator cuff tear or rupture of left shoulder, not specified as traumatic: Secondary | ICD-10-CM | POA: Diagnosis not present

## 2024-04-12 DIAGNOSIS — M51369 Other intervertebral disc degeneration, lumbar region without mention of lumbar back pain or lower extremity pain: Secondary | ICD-10-CM | POA: Insufficient documentation

## 2024-04-12 MED ORDER — METHYLPREDNISOLONE ACETATE 40 MG/ML IJ SUSP
40.0000 mg | Freq: Once | INTRAMUSCULAR | Status: AC
  Administered 2024-04-12: 40 mg via INTRA_ARTICULAR

## 2024-04-12 NOTE — Telephone Encounter (Signed)
 Cardiac Catheterization scheduled at [facility] for: Thursday April 19, 2024 10:30 AM/TEE 7:30 AM Arrival time [facility] Main Entrance A at: 6:30 AM  Diet: Nothing to eat or drink after midnight.  Medication instructions: -Hold:  Jardiance -do not take starting Monday 04/16/24 until after procedure  Eliquis -do not take starting Tuesday 04/17/24 until after procedure  Metformin -do not take day of procedure and 48 hours after procedure   Lasix -do not take morning of procedure -Other usual morning medications can be taken with sips of water  including aspirin  81 mg.  Plan to go home the same day, you will only stay overnight if medically necessary.  You must have responsible adult to drive you home.  Someone must be with you the first 24 hours after you arrive home.  Reviewed procedure instructions with patient and patient's wife.

## 2024-04-12 NOTE — Progress Notes (Signed)
" ° ° °  04/12/2024   Chief Complaint  Patient presents with   Shoulder Pain    Left     No diagnosis found.  What pharmacy do you use ? ____WG scales_______________________  DOI/DOS/ Date:    Did you get better, worse or no change (Answer below)   Worse wants injection last one helped until he lifted deck of tractor since then has gotten worse       "

## 2024-04-12 NOTE — Progress Notes (Signed)
" ° °  Patient: Jonathan Gilmore           Date of Birth: March 26, 1950           MRN: [id number] Visit Date: 04/12/2024 Requested by: Aletha Bene, MD [address] [place],  [place] [address] PCP: Aletha Bene, MD  Encounter Diagnoses  Name Primary?   Acute pain of left shoulder Yes   Rotator cuff tear arthropathy of left shoulder    Chronic left shoulder pain     Assessment and plan:  Acute on chronic pain with underlying rotator cuff tear left shoulder with medical issue involving aortic valve.  Recommend injection  Meds ordered this encounter  Medications   methylPREDNISolone  acetate (DEPO-MEDROL ) injection 40 mg     Chief Complaint  Patient presents with   Shoulder Pain    Left     History:  Shoulder Pain   Mr. Stoffers has a MRI confirmed tear of his left rotator cuff.  He received an injection in his left shoulder on December 12.  He did well with that.  However he lifted the deck of a large lawn more and his pain increased  Reevaluation   Focused exam findings:  Painful forward elevation of the left shoulder with 120 degrees of flexion in the scapular plane.  Definite weakness 4 out of 5 in abduction and flexion  The patient has an aortic valve issue will require aortic valve replacement preferably through the procedure through the groin  Recommend injection patient agrees  Left shoulder injection  Procedure note for injection   Chief Complaint  Patient presents with   Shoulder Pain    Left      Encounter Diagnoses  Name Primary?   Acute pain of left shoulder Yes   Rotator cuff tear arthropathy of left shoulder    Chronic left shoulder pain         The patient has consented for injection of the left  Joint: Subacromial space and shoulder  Medication: Depo-Medrol  40 mg and lidocaine  1%  Time out completed: Yes  The site of injection was cleaned with alcohol and ethyl chloride.  The injection was given without any complications appropriate  precautions were given.   No results found.    "

## 2024-04-18 ENCOUNTER — Ambulatory Visit: Admitting: Urology

## 2024-04-18 VITALS — BP 136/69 | HR 86

## 2024-04-18 DIAGNOSIS — R351 Nocturia: Secondary | ICD-10-CM

## 2024-04-18 DIAGNOSIS — N5201 Erectile dysfunction due to arterial insufficiency: Secondary | ICD-10-CM

## 2024-04-18 DIAGNOSIS — C61 Malignant neoplasm of prostate: Secondary | ICD-10-CM

## 2024-04-18 NOTE — Progress Notes (Unsigned)
 "  04/18/2024 2:29 PM   Marcellis D Gavia 22-Jun-1950 [id number]  Referring provider: Aletha Bene, MD [address] [place],  [place] [address]  No chief complaint on file.   HPI: Mr Delucia is a 74yo here for followup for prostate cancer, BPH and erectile dysfunction. PSA 3.68. NO prior MRI. IPSS 10 QOL 3 on no BPh therapy. He is having a heart catheterization tomorrow.    PMH: Past Medical History:  Diagnosis Date   Abnormal cardiovascular stress test 03/2013   Anxiety    Arthritis    Back pain    Cancer (HCC)    Prostate cancer-no treatment per pt   CHF (congestive heart failure) (HCC)    Clotting disorder    Coronary artery disease    Multivessel with significant LAD involvement by chest CT 2014   Depression    Diastolic dysfunction    Essential hypertension    Heart murmur    Lumbar herniated disc    L4-L5   MRSA (methicillin resistant staph aureus) culture positive    Myocardial infarction (HCC)    Neuropathy    Shortness of breath dyspnea    Sleep apnea    No cpap   Stroke (HCC) 10/24/2023   Type 2 diabetes mellitus [facility])     Surgical History: Past Surgical History:  Procedure Laterality Date   ANTERIOR CERVICAL DECOMP/DISCECTOMY FUSION  01/18/2011   Procedure: ANTERIOR CERVICAL DECOMPRESSION/DISCECTOMY FUSION 2 LEVELS;  Surgeon: Victory LABOR Pool;  [facility]: [facility] NEURO ORS;  Service: Neurosurgery;  Laterality: N/A;  anterior cervical discectomy and fusion with allograft and plating, cervical five-six, cervical six-seven   BIOPSY N/A 04/04/2015   Procedure: BIOPSY;  Surgeon: Claudis RAYMOND Rivet, MD;  Location: [facility] ENDO SU[facility];  Service: Endoscopy;  Laterality: N/A;   BIOPSY  11/09/2019   Procedure: BIOPSY;  Surgeon: Eartha Angelia Sieving, MD;  [facility]: [facility];  Service: Gastroenterology;;   Bone spur removed  1997   Left shoulder   CARDIAC CATHETERIZATION N/A 08/08/2015   Procedure: Right/Left Heart Cath and Coronary Angiography;  Surgeon: Ozell Fell, MD;   Location: [facility] INVASIVE CV LAB;  Service: Cardiovascular;  Laterality: N/A;   CARPAL TUNNEL RELEASE Right    COLONOSCOPY WITH PROPOFOL  N/A 11/09/2019   Procedure: COLONOSCOPY WITH PROPOFOL ;  Surgeon: Eartha Angelia Sieving, MD;  [facility]: [facility];  Service: Gastroenterology;  Laterality: N/A;  900   COLONOSCOPY WITH PROPOFOL  N/A 02/27/2021   Procedure: COLONOSCOPY WITH PROPOFOL ;  Surgeon: Eartha Angelia Sieving, MD;  [facility];  Service: Gastroenterology;  Laterality: N/A;  7:30   COLONOSCOPY WITH PROPOFOL  N/A 04/05/2022   Procedure: COLONOSCOPY WITH PROPOFOL ;  Surgeon: Shila Gustav GAILS, MD;  Location: [facility] ENDOSCOPY;  Service: Gastroenterology;  Laterality: N/A;   ENTEROSCOPY N/A 06/30/2022   Procedure: ENTEROSCOPY;  Surgeon: Eartha Angelia Sieving, MD;  [facility];  Service: Gastroenterology;  Laterality: N/A;  2:00pm;ASA 3   ESOPHAGEAL DILATION N/A 04/04/2015   Procedure: ESOPHAGEAL DILATION;  Surgeon: Claudis RAYMOND Rivet, MD;  [facility];  Service: Endoscopy;  Laterality: N/A;   ESOPHAGEAL DILATION N/A 11/09/2019   Procedure: ESOPHAGEAL DILATION;  Surgeon: Eartha Angelia Sieving, MD;  [facility];  Service: Gastroenterology;  Laterality: N/A;   ESOPHAGOGASTRODUODENOSCOPY N/A 04/04/2015   Procedure: ESOPHAGOGASTRODUODENOSCOPY (EGD);  Surgeon: Claudis RAYMOND Rivet, MD;  [facility];  Service: Endoscopy;  Laterality: N/A;  1240   ESOPHAGOGASTRODUODENOSCOPY (EGD) WITH PROPOFOL  N/A 11/09/2019   Procedure: ESOPHAGOGASTRODUODENOSCOPY (EGD)  WITH PROPOFOL ;  Surgeon: Eartha Flavors, Toribio, MD;  [facility];  Service: Gastroenterology;  Laterality: N/A;   ESOPHAGOGASTRODUODENOSCOPY (EGD) WITH PROPOFOL  N/A 04/04/2022   Procedure: ESOPHAGOGASTRODUODENOSCOPY (EGD) WITH PROPOFOL ;  Surgeon: Leigh Elspeth SQUIBB, MD;  Location[facility] ENDOSCOPY;  Service: Gastroenterology;  Laterality: N/A;   ESOPHAGOGASTRODUODENOSCOPY (EGD) WITH  PROPOFOL   06/30/2022   Procedure: ESOPHAGOGASTRODUODENOSCOPY (EGD) WITH PROPOFOL ;  Surgeon: Eartha Flavors Toribio, MD;  [facility];  Service: Gastroenterology;;   GIVENS CAPSULE STUDY N/A 05/11/2022   Procedure: GIVENS CAPSULE STUDY;  Surgeon: Eartha Flavors Toribio, MD;  [facility];  Service: Gastroenterology;  Laterality: N/A;  7:30 am   HEMORRHOID SURGERY     HOT HEMOSTASIS  06/30/2022   Procedure: HOT HEMOSTASIS (ARGON PLASMA COAGULATION/BICAP);  Surgeon: Eartha Flavors, Toribio, MD;  [facility];  Service: Gastroenterology;;   POLYPECTOMY  11/09/2019   Procedure: POLYPECTOMY;  Surgeon: Eartha Flavors Toribio, MD;  [facility];  Service: Gastroenterology;;   POLYPECTOMY  02/27/2021   Procedure: POLYPECTOMY;  Surgeon: Eartha Flavors Toribio, MD;  [facility];  Service: Gastroenterology;;   POLYPECTOMY  04/05/2022   Procedure: POLYPECTOMY;  Surgeon: Shila Gustav GAILS, MD;  Location: [facility] ENDOSCOPY;  Service: Gastroenterology;;   TONSILLECTOMY      Home Medications:  Allergies as of 04/18/2024       Reactions   Morphine And Codeine Nausea And Vomiting        Medication List        Accurate as of April 18, 2024  2:29 PM. If you have any questions, ask your nurse or doctor.          Accu-Chek Aviva Plus test strip Generic drug: glucose blood USE AS INSTRUCTED   apixaban  5 MG Tabs tablet Commonly known as: ELIQUIS  Take 1 tablet (5 mg total) by mouth 2 (two) times daily.   atorvastatin  80 MG tablet Commonly known as: LIPITOR  Take 1 tablet (80 mg total) by mouth daily.   CAL-MAG-ZINC PO Take 1 tablet by mouth daily.   empagliflozin  10 MG Tabs tablet Commonly known as: JARDIANCE  Take 1 tablet (10 mg total) by mouth daily.   EYE VITAMINS PO Take 1 tablet by mouth daily at 12 noon.   furosemide  40 MG tablet Commonly known as: LASIX  Take 1 tablet (40 mg total) by mouth daily.   gabapentin  300  MG capsule Commonly known as: NEURONTIN  TAKE 1 TO 2 CAPSULES BY MOUTH  TWICE DAILY What changed: See the new instructions.   lisinopril  20 MG tablet Commonly known as: ZESTRIL  Take 1 tablet (20 mg total) by mouth daily.   metFORMIN  500 MG tablet Commonly known as: GLUCOPHAGE  Take 2 tablets (1,000 mg total) by mouth 2 (two) times daily with a meal.   metoprolol  succinate 50 MG 24 hr tablet Commonly known as: TOPROL -XL TAKE 1 TABLET BY MOUTH DAILY   multivitamin with minerals Tabs tablet Take 1 tablet by mouth daily.   nitroGLYCERIN  0.4 MG SL tablet Commonly known as: NITROSTAT  Place 1 tablet (0.4 mg total) under the tongue every 5 (five) minutes x 3 doses as needed for chest pain (if no relief after 2nd dose, proceed to ED for an evaluation).   OMEGA 3 FISH OIL PO Take 1 capsule by mouth daily.   pantoprazole  40 MG tablet Commonly known as: PROTONIX  TAKE 1 TABLET BY MOUTH TWICE  DAILY        Allergies: Allergies[1]  Family History: Family History  Problem  Relation Age of Onset   Lung cancer Mother    Diabetes Father    Hepatitis B Father    Diabetes Sister    Cushing syndrome Sister     Social History:  reports that he has been smoking cigarettes. He started smoking about 56 years ago. He has a 56.4 pack-year smoking history. He has quit using smokeless tobacco. He reports that he does not currently use alcohol. He reports current drug use. Frequency: 5.00 times per week. Drug: Marijuana.  ROS: All other review of systems were reviewed and are negative except what is noted above in HPI  Physical Exam: BP 136/69   Pulse 86   Constitutional:  Alert and oriented, No acute distress. HEENT: [place] AT, moist mucus membranes.  Trachea midline, no masses. Cardiovascular: No clubbing, cyanosis, or edema. Respiratory: Normal respiratory effort, no increased work of breathing. GI: Abdomen is soft, nontender, nondistended, no abdominal masses GU: No CVA tenderness.  Lymph:  No cervical or inguinal lymphadenopathy. Skin: No rashes, bruises or suspicious lesions. Neurologic: Grossly intact, no focal deficits, moving all 4 extremities. Psychiatric: Normal mood and affect.  Laboratory Data: Lab Results  Component Value Date   WBC 11.5 (H) 03/30/2024   HGB 14.1 03/30/2024   HCT 43.9 03/30/2024   MCV 95.4 03/30/2024   PLT 250 03/30/2024    Lab Results  Component Value Date   CREATININE 0.72 03/30/2024    Lab Results  Component Value Date   PSA 3.85 12/01/2020   PSA 2.1 02/26/2019    Lab Results  Component Value Date   TESTOSTERONE  241.06 (L) 12/01/2020    Lab Results  Component Value Date   HGBA1C 7.6 (H) 02/24/2024    Urinalysis    Component Value Date/Time   COLORURINE STRAW (A) 01/16/2018 0938   APPEARANCEUR Clear 09/26/2023 1535   LABSPEC 1.008 01/16/2018 0938   PHURINE 7.0 01/16/2018 0938   GLUCOSEU Negative 09/26/2023 1535   HGBUR NEGATIVE 01/16/2018 0938   BILIRUBINUR Negative 09/26/2023 1535   KETONESUR NEGATIVE 01/16/2018 0938   PROTEINUR Negative 09/26/2023 1535   PROTEINUR NEGATIVE 01/16/2018 0938   UROBILINOGEN 0.2 08/23/2011 0807   NITRITE Negative 09/26/2023 1535   NITRITE NEGATIVE 01/16/2018 0938   LEUKOCYTESUR Negative 09/26/2023 1535    Lab Results  Component Value Date   LABMICR See below: 09/26/2023   WBCUA None seen 09/26/2023   LABEPIT 0-10 09/26/2023   BACTERIA None seen 09/26/2023    Pertinent Imaging: *** No results found for this or any previous visit.  No results found for this or any previous visit.  No results found for this or any previous visit.  No results found for this or any previous visit.  No results found for this or any previous visit.  No results found for this or any previous visit.  No results found for this or any previous visit.  No results found for this or any previous visit.   Assessment & Plan:    1. Prostate cancer (HCC) (Primary) ***  2. Nocturia ***  3.  Erectile dysfunction due to arterial insufficiency ***   No follow-ups on file.  Belvie Clara, MD  [facility] [place]      [1]  Allergies Allergen Reactions   Morphine And Codeine Nausea And Vomiting   "

## 2024-04-18 NOTE — Progress Notes (Signed)
 Pt called for pre procedure instructions.  Holding Jardiance , Metformin  and Eliquis .  ASA 81 mg tomorrow am. Arrival time 0630 NPO after midnight explained Instructed to take am meds with sip of water  and confirmed blood thinner consistency Instructed pt need for ride home tomorrow and have responsible adult with them for 24 hrs post procedure.

## 2024-04-19 ENCOUNTER — Encounter (HOSPITAL_COMMUNITY)
Admission: RE | Disposition: A | Discharge status: Home / Self Care | Payer: Self-pay | Source: Home / Self Care | Attending: Cardiovascular Disease

## 2024-04-19 ENCOUNTER — Ambulatory Visit (HOSPITAL_COMMUNITY)

## 2024-04-19 ENCOUNTER — Other Ambulatory Visit: Payer: Self-pay

## 2024-04-19 ENCOUNTER — Encounter (HOSPITAL_COMMUNITY): Payer: Self-pay | Admitting: Cardiovascular Disease

## 2024-04-19 ENCOUNTER — Ambulatory Visit (HOSPITAL_COMMUNITY)
Admission: RE | Admit: 2024-04-19 | Discharge: 2024-04-19 | Disposition: A | Attending: Cardiovascular Disease | Admitting: Cardiovascular Disease

## 2024-04-19 DIAGNOSIS — I252 Old myocardial infarction: Secondary | ICD-10-CM | POA: Insufficient documentation

## 2024-04-19 DIAGNOSIS — E1159 Type 2 diabetes mellitus with other circulatory complications: Secondary | ICD-10-CM | POA: Insufficient documentation

## 2024-04-19 DIAGNOSIS — G4730 Sleep apnea, unspecified: Secondary | ICD-10-CM | POA: Insufficient documentation

## 2024-04-19 DIAGNOSIS — I350 Nonrheumatic aortic (valve) stenosis: Secondary | ICD-10-CM

## 2024-04-19 DIAGNOSIS — Z7984 Long term (current) use of oral hypoglycemic drugs: Secondary | ICD-10-CM | POA: Insufficient documentation

## 2024-04-19 DIAGNOSIS — I480 Paroxysmal atrial fibrillation: Secondary | ICD-10-CM | POA: Insufficient documentation

## 2024-04-19 DIAGNOSIS — I110 Hypertensive heart disease with heart failure: Secondary | ICD-10-CM | POA: Insufficient documentation

## 2024-04-19 DIAGNOSIS — Z7901 Long term (current) use of anticoagulants: Secondary | ICD-10-CM | POA: Insufficient documentation

## 2024-04-19 DIAGNOSIS — I2510 Atherosclerotic heart disease of native coronary artery without angina pectoris: Secondary | ICD-10-CM | POA: Insufficient documentation

## 2024-04-19 DIAGNOSIS — I080 Rheumatic disorders of both mitral and aortic valves: Secondary | ICD-10-CM | POA: Insufficient documentation

## 2024-04-19 DIAGNOSIS — I700 Atherosclerosis of aorta: Secondary | ICD-10-CM | POA: Insufficient documentation

## 2024-04-19 DIAGNOSIS — I5032 Chronic diastolic (congestive) heart failure: Secondary | ICD-10-CM | POA: Insufficient documentation

## 2024-04-19 DIAGNOSIS — Q2112 Patent foramen ovale: Secondary | ICD-10-CM | POA: Insufficient documentation

## 2024-04-19 DIAGNOSIS — D6869 Other thrombophilia: Secondary | ICD-10-CM | POA: Insufficient documentation

## 2024-04-19 DIAGNOSIS — F17210 Nicotine dependence, cigarettes, uncomplicated: Secondary | ICD-10-CM | POA: Insufficient documentation

## 2024-04-19 DIAGNOSIS — E1169 Type 2 diabetes mellitus with other specified complication: Secondary | ICD-10-CM | POA: Insufficient documentation

## 2024-04-19 DIAGNOSIS — Z79899 Other long term (current) drug therapy: Secondary | ICD-10-CM | POA: Insufficient documentation

## 2024-04-19 DIAGNOSIS — E7800 Pure hypercholesterolemia, unspecified: Secondary | ICD-10-CM | POA: Insufficient documentation

## 2024-04-19 DIAGNOSIS — I152 Hypertension secondary to endocrine disorders: Secondary | ICD-10-CM | POA: Insufficient documentation

## 2024-04-19 DIAGNOSIS — Z8673 Personal history of transient ischemic attack (TIA), and cerebral infarction without residual deficits: Secondary | ICD-10-CM | POA: Insufficient documentation

## 2024-04-19 LAB — POCT I-STAT 7, (LYTES, BLD GAS, ICA,H+H)
Acid-Base Excess: 1 mmol/L (ref 0.0–2.0)
Bicarbonate: 27.5 mmol/L (ref 20.0–28.0)
Calcium, Ion: 1.22 mmol/L (ref 1.15–1.40)
HCT: 38 % — ABNORMAL LOW (ref 39.0–52.0)
Hemoglobin: 12.9 g/dL — ABNORMAL LOW (ref 13.0–17.0)
O2 Saturation: 87 %
Potassium: 3.7 mmol/L (ref 3.5–5.1)
Sodium: 141 mmol/L (ref 135–145)
TCO2: 29 mmol/L (ref 22–32)
pCO2 arterial: 50.8 mmHg — ABNORMAL HIGH (ref 32–48)
pH, Arterial: 7.342 — ABNORMAL LOW (ref 7.35–7.45)
pO2, Arterial: 58 mmHg — ABNORMAL LOW (ref 83–108)

## 2024-04-19 LAB — POCT I-STAT EG7
Acid-Base Excess: 1 mmol/L (ref 0.0–2.0)
Acid-Base Excess: 2 mmol/L (ref 0.0–2.0)
Bicarbonate: 27.1 mmol/L (ref 20.0–28.0)
Bicarbonate: 28.5 mmol/L — ABNORMAL HIGH (ref 20.0–28.0)
Calcium, Ion: 1.11 mmol/L — ABNORMAL LOW (ref 1.15–1.40)
Calcium, Ion: 1.23 mmol/L (ref 1.15–1.40)
HCT: 37 % — ABNORMAL LOW (ref 39.0–52.0)
HCT: 39 % (ref 39.0–52.0)
Hemoglobin: 12.6 g/dL — ABNORMAL LOW (ref 13.0–17.0)
Hemoglobin: 13.3 g/dL (ref 13.0–17.0)
O2 Saturation: 63 %
O2 Saturation: 69 %
Potassium: 3.4 mmol/L — ABNORMAL LOW (ref 3.5–5.1)
Potassium: 3.7 mmol/L (ref 3.5–5.1)
Sodium: 142 mmol/L (ref 135–145)
Sodium: 144 mmol/L (ref 135–145)
TCO2: 29 mmol/L (ref 22–32)
TCO2: 30 mmol/L (ref 22–32)
pCO2, Ven: 50 mmHg (ref 44–60)
pCO2, Ven: 52.3 mmHg (ref 44–60)
pH, Ven: 7.342 (ref 7.25–7.43)
pH, Ven: 7.345 (ref 7.25–7.43)
pO2, Ven: 35 mmHg (ref 32–45)
pO2, Ven: 39 mmHg (ref 32–45)

## 2024-04-19 LAB — GLUCOSE, CAPILLARY: Glucose-Capillary: 161 mg/dL — ABNORMAL HIGH (ref 70–99)

## 2024-04-19 LAB — ECHO TEE

## 2024-04-19 MED ORDER — LIDOCAINE HCL (PF) 1 % IJ SOLN
INTRAMUSCULAR | Status: DC | PRN
  Administered 2024-04-19 (×2): 2 mL

## 2024-04-19 MED ORDER — HEPARIN SODIUM (PORCINE) 1000 UNIT/ML IJ SOLN
INTRAMUSCULAR | Status: DC | PRN
  Administered 2024-04-19: 5000 [IU] via INTRAVENOUS

## 2024-04-19 MED ORDER — HEPARIN SODIUM (PORCINE) 1000 UNIT/ML IJ SOLN
INTRAMUSCULAR | Status: AC
  Filled 2024-04-19: qty 10

## 2024-04-19 MED ORDER — MIDAZOLAM HCL (PF) 2 MG/2ML IJ SOLN
INTRAMUSCULAR | Status: DC | PRN
  Administered 2024-04-19: 1 mg via INTRAVENOUS

## 2024-04-19 MED ORDER — ASPIRIN 81 MG PO CHEW: 81.0000 mg | CHEWABLE_TABLET | ORAL | Status: DC

## 2024-04-19 MED ORDER — SODIUM CHLORIDE 0.9% FLUSH: 3.0000 mL | INTRAVENOUS | Status: DC | PRN

## 2024-04-19 MED ORDER — HYDRALAZINE HCL 20 MG/ML IJ SOLN: 10.0000 mg | INTRAMUSCULAR | Status: DC | PRN

## 2024-04-19 MED ORDER — HEPARIN (PORCINE) IN NACL 1000-0.9 UT/500ML-% IV SOLN
INTRAVENOUS | Status: DC | PRN
  Administered 2024-04-19: 1000 mL via SURGICAL_CAVITY

## 2024-04-19 MED ORDER — SODIUM CHLORIDE 0.9% FLUSH: 3.0000 mL | Freq: Two times a day (BID) | INTRAVENOUS | Status: DC

## 2024-04-19 MED ORDER — ONDANSETRON HCL 4 MG/2ML IJ SOLN: 4.0000 mg | Freq: Four times a day (QID) | INTRAMUSCULAR | Status: DC | PRN

## 2024-04-19 MED ORDER — ACETAMINOPHEN 325 MG PO TABS: 650.0000 mg | ORAL_TABLET | ORAL | Status: DC | PRN

## 2024-04-19 MED ORDER — VERAPAMIL HCL 2.5 MG/ML IV SOLN
INTRAVENOUS | Status: DC | PRN
  Administered 2024-04-19: 10 mL via INTRA_ARTERIAL

## 2024-04-19 MED ORDER — FREE WATER: 500.0000 mL | Freq: Once | Status: DC

## 2024-04-19 MED ORDER — IOHEXOL 350 MG/ML SOLN
INTRAVENOUS | Status: DC | PRN
  Administered 2024-04-19: 65 mL via INTRA_ARTERIAL

## 2024-04-19 MED ORDER — LIDOCAINE HCL (PF) 1 % IJ SOLN
INTRAMUSCULAR | Status: AC
  Filled 2024-04-19: qty 30

## 2024-04-19 MED ORDER — LIDOCAINE HCL 4 % EX SOLN
CUTANEOUS | Status: DC | PRN
  Administered 2024-04-19: 5 mL via TOPICAL

## 2024-04-19 MED ORDER — SODIUM CHLORIDE 0.9 % IV SOLN: 250.0000 mL | INTRAVENOUS | Status: DC | PRN

## 2024-04-19 MED ORDER — FENTANYL CITRATE (PF) 100 MCG/2ML IJ SOLN
INTRAMUSCULAR | Status: DC | PRN
  Administered 2024-04-19: 25 ug via INTRAVENOUS

## 2024-04-19 MED ORDER — MIDAZOLAM HCL 2 MG/2ML IJ SOLN
INTRAMUSCULAR | Status: AC
  Filled 2024-04-19: qty 2

## 2024-04-19 MED ORDER — SODIUM CHLORIDE 0.9 % IV SOLN: INTRAVENOUS | Status: DC

## 2024-04-19 MED ORDER — PROPOFOL 10 MG/ML IV BOLUS
INTRAVENOUS | Status: DC | PRN
  Administered 2024-04-19: 20 mg via INTRAVENOUS
  Administered 2024-04-19: 10 mg via INTRAVENOUS
  Administered 2024-04-19: 50 mg via INTRAVENOUS
  Administered 2024-04-19: 20 mg via INTRAVENOUS
  Administered 2024-04-19: 10 mg via INTRAVENOUS
  Administered 2024-04-19 (×3): 20 mg via INTRAVENOUS
  Administered 2024-04-19 (×2): 50 mg via INTRAVENOUS

## 2024-04-19 MED ORDER — VERAPAMIL HCL 2.5 MG/ML IV SOLN
INTRAVENOUS | Status: AC
  Filled 2024-04-19: qty 2

## 2024-04-19 MED ORDER — FENTANYL CITRATE (PF) 100 MCG/2ML IJ SOLN
INTRAMUSCULAR | Status: AC
  Filled 2024-04-19: qty 2

## 2024-04-19 MED ORDER — LABETALOL HCL 5 MG/ML IV SOLN: 10.0000 mg | INTRAVENOUS | Status: DC | PRN

## 2024-04-19 NOTE — Interval H&P Note (Signed)
 History and Physical Interval Note:  04/19/2024 7:17 AM  Jonathan Gilmore  has presented today for surgery, with the diagnosis of AORTIC STENOSIS.  The various methods of treatment have been discussed with the patient and family. After consideration of risks, benefits and other options for treatment, the patient has consented to  Procedures: TRANSESOPHAGEAL ECHOCARDIOGRAM (N/A) as a surgical intervention.  The patient's history has been reviewed, patient examined, no change in status, stable for surgery.  I have reviewed the patient's chart and labs.  Questions were answered to the patient's satisfaction.    NPO for TEE for AS and ASD.  Signed, Darryle DASEN. Barbaraann, MD, [facility]  [facility]  [address] [place], [place] [address] [phone]  7:17 AM

## 2024-04-19 NOTE — Discharge Instructions (Addendum)
 TEE  YOU HAD AN CARDIAC PROCEDURE TODAY: Refer to the procedure report and other information in the discharge instructions given to you for any specific questions about what was found during the examination. If this information does not answer your questions, please call [facility] office at [phone] to clarify.   DIET: Your first meal following the procedure should be a light meal and then it is ok to progress to your normal diet. A half-sandwich or bowl of soup is an example of a good first meal. Heavy or fried foods are harder to digest and may make you feel nauseous or bloated. Drink plenty of fluids but you should avoid alcoholic beverages for 24 hours. If you had a esophageal dilation, please see attached instructions for diet.   ACTIVITY: Your care partner should take you home directly after the procedure. You should plan to take it easy, moving slowly for the rest of the day. You can resume normal activity the day after the procedure however YOU SHOULD NOT DRIVE, use power tools, machinery or perform tasks that involve climbing or major physical exertion for 24 hours (because of the sedation medicines used during the test).   SYMPTOMS TO REPORT IMMEDIATELY: A cardiologist can be reached at any hour. Please call [phone] for any of the following symptoms:  Vomiting of blood or coffee ground material  New, significant abdominal pain  New, significant chest pain or pain under the shoulder blades  Painful or persistently difficult swallowing  New shortness of breath  Black, tarry-looking or red, bloody stools  FOLLOW UP:  Please also call with any specific questions about appointments or follow up tests.     NO METFORMIN  FOR 2 DAYS

## 2024-04-19 NOTE — Anesthesia Preprocedure Evaluation (Addendum)
"                                    Anesthesia Evaluation  Patient identified by MRN, date of birth, ID band Patient awake    Reviewed: Allergy & Precautions, NPO status , Patient's Chart, lab work & pertinent test results, reviewed documented beta blocker date and time   History of Anesthesia Complications Negative for: history of anesthetic complications  Airway Mallampati: II  TM Distance: >3 FB     Dental  (+) Loose, Poor Dentition   Pulmonary shortness of breath and with exertion, sleep apnea , neg COPD, Current Smoker   breath sounds clear to auscultation       Cardiovascular hypertension, + CAD, + Past MI and +CHF  + Valvular Problems/Murmurs AS  Rhythm:Regular Rate:Normal  IMPRESSIONS     1. Left ventricular ejection fraction, by estimation, is 60 to 65%. The  left ventricle has normal function. The left ventricle has no regional  wall motion abnormalities. There is moderate concentric left ventricular  hypertrophy. Left ventricular  diastolic parameters are indeterminate.   2. Right ventricular systolic function is normal. The right ventricular  size is normal. Tricuspid regurgitation signal is inadequate for assessing  PA pressure.   3. Left atrial size was severely dilated.   4. Right atrial size was mildly dilated.   5. The mitral valve is degenerative. Mild mitral valve regurgitation.   6. The aortic valve is tricuspid. There is severe calcifcation of the  aortic valve. Aortic valve regurgitation is mild. Severe aortic valve  stenosis, paradoxical normal flow/low gradient. Aortic valve mean gradient  measures 25.0 mmHg. Dimentionless  index 0.17.   7. The inferior vena cava is normal in size with greater than 50%  respiratory variability, suggesting right atrial pressure of 3 mmHg.   8. Evidence of atrial level shunting detected by color flow Doppler.  There is a small secundum atrial septal defect with predominantly left to  right shunting  across the atrial septum.     Neuro/Psych  PSYCHIATRIC DISORDERS Anxiety Depression     Neuromuscular disease CVA    GI/Hepatic ,GERD  ,,  Endo/Other  diabetes    Renal/GU      Musculoskeletal  (+) Arthritis , Osteoarthritis,    Abdominal   Peds  Hematology  (+) Blood dyscrasia, anemia   Anesthesia Other Findings   Reproductive/Obstetrics                              Anesthesia Physical Anesthesia Plan  ASA: 4  Anesthesia Plan: MAC   Post-op Pain Management:    Induction: Intravenous  PONV Risk Score and Plan: 2 and Ondansetron  and Propofol  infusion  Airway Management Planned: Natural Airway and Nasal Cannula  Additional Equipment:   Intra-op Plan:   Post-operative Plan:   Informed Consent: I have reviewed the patients History and Physical, chart, labs and discussed the procedure including the risks, benefits and alternatives for the proposed anesthesia with the patient or authorized representative who has indicated his/her understanding and acceptance.     Dental advisory given  Plan Discussed with: CRNA  Anesthesia Plan Comments:         Anesthesia Quick Evaluation  "

## 2024-04-19 NOTE — Transfer of Care (Signed)
 Immediate Anesthesia Transfer of Care Note  Patient: Jonathan Gilmore  Procedure(s) Performed: TRANSESOPHAGEAL ECHOCARDIOGRAM  Patient Location: PACU and Cath Lab  Anesthesia Type:MAC  Level of Consciousness: drowsy  Airway & Oxygen Therapy: Patient Spontanous Breathing and Patient connected to nasal cannula oxygen  Post-op Assessment: Report given to RN and Post -op Vital signs reviewed and stable  Post vital signs: stable  Last Vitals:  Vitals Value Taken Time  BP 91/53 04/19/24 08:02  Temp 36.1 C 04/19/24 08:02  Pulse 102 04/19/24 08:03  Resp 14 04/19/24 08:03  SpO2 97 % 04/19/24 08:03  Vitals shown include unfiled device data.  Last Pain:  Vitals:   04/19/24 0802  TempSrc: Tympanic  PainSc: Asleep         Complications: No notable events documented.

## 2024-04-19 NOTE — Interval H&P Note (Signed)
 History and Physical Interval Note:  04/19/2024 8:36 AM  Jonathan Gilmore  has presented today for surgery, with the diagnosis of a/s.  The various methods of treatment have been discussed with the patient and family. After consideration of risks, benefits and other options for treatment, the patient has consented to  Procedures: RIGHT/LEFT HEART CATH AND CORONARY ANGIOGRAPHY (N/A) as a surgical intervention.  The patient's history has been reviewed, patient examined, no change in status, stable for surgery.  I have reviewed the patient's chart and labs.  Questions were answered to the patient's satisfaction.     Dejanira Pamintuan K Icess Bertoni

## 2024-04-19 NOTE — Progress Notes (Signed)
" °  Echocardiogram Echocardiogram Transesophageal has been performed.  Koleen KANDICE Popper, RDCS 04/19/2024, 8:15 AM "

## 2024-04-19 NOTE — CV Procedure (Signed)
" ° ° °  TRANSESOPHAGEAL ECHOCARDIOGRAM   NAME:  Jonathan Gilmore    MRN: [id number] DOB:  Nov 24, 1950    ADMIT DATE: 04/19/2024  INDICATIONS: TEE  PROCEDURE:   Informed consent was obtained prior to the procedure. The risks, benefits and alternatives for the procedure were discussed and the patient comprehended these risks.  Risks include, but are not limited to, cough, sore throat, vomiting, nausea, somnolence, esophageal and stomach trauma or perforation, bleeding, low blood pressure, aspiration, pneumonia, infection, trauma to the teeth and death.    Procedural time out performed. The oropharynx was anesthetized with topical 1% benzocaine .    Anesthesia was administered by Dr. Keneth.  The patient was administered propofol  and lidocaine  to achieve and maintain moderate conscious sedation per anesthesia.  The patient's heart rate, blood pressure, and oxygen saturation are monitored continuously during the procedure. The period of conscious sedation is 16 minutes, of which I was present face-to-face 100% of this time.   The transesophageal probe was inserted in the esophagus and stomach without difficulty and multiple views were obtained.   COMPLICATIONS:    There were no immediate complications.  KEY FINDINGS:  Moderate PFO with L to R shunting.  Severe AS.  Normal LV/RV function.  Full report to follow. Further management per primary team.   Signed, Darryle DASEN. Barbaraann, MD, [facility]  [facility]  [address] [place], [place] [address] [phone]  8:05 AM   "

## 2024-04-19 NOTE — Anesthesia Postprocedure Evaluation (Signed)
"   Anesthesia Post Note  Patient: Jonathan Gilmore  Procedure(s) Performed: TRANSESOPHAGEAL ECHOCARDIOGRAM     Patient location during evaluation: PACU Anesthesia Type: MAC Level of consciousness: awake and alert Pain management: pain level controlled Vital Signs Assessment: post-procedure vital signs reviewed and stable Respiratory status: spontaneous breathing, nonlabored ventilation, respiratory function stable and patient connected to nasal cannula oxygen Cardiovascular status: stable and blood pressure returned to baseline Postop Assessment: no apparent nausea or vomiting Anesthetic complications: no   No notable events documented.  Last Vitals:  Vitals:   04/19/24 1022 04/19/24 1103  BP: (!) 141/62 (!) 117/53  Pulse: 80 76  Resp:    Temp:    SpO2: 99% 96%    Last Pain:  Vitals:   04/19/24 0915  TempSrc:   PainSc: 0-No pain                 [name]      "

## 2024-04-20 ENCOUNTER — Other Ambulatory Visit (INDEPENDENT_AMBULATORY_CARE_PROVIDER_SITE_OTHER): Payer: Self-pay | Admitting: Gastroenterology

## 2024-04-20 ENCOUNTER — Other Ambulatory Visit: Payer: Self-pay | Admitting: Family Medicine

## 2024-04-20 DIAGNOSIS — K2100 Gastro-esophageal reflux disease with esophagitis, without bleeding: Secondary | ICD-10-CM

## 2024-04-20 DIAGNOSIS — Z8719 Personal history of other diseases of the digestive system: Secondary | ICD-10-CM

## 2024-04-20 DIAGNOSIS — E1159 Type 2 diabetes mellitus with other circulatory complications: Secondary | ICD-10-CM

## 2024-04-25 ENCOUNTER — Ambulatory Visit (INDEPENDENT_AMBULATORY_CARE_PROVIDER_SITE_OTHER): Admitting: Gastroenterology

## 2024-04-30 ENCOUNTER — Ambulatory Visit (HOSPITAL_COMMUNITY)

## 2024-05-01 ENCOUNTER — Ambulatory Visit (HOSPITAL_COMMUNITY)

## 2024-06-25 ENCOUNTER — Ambulatory Visit: Admitting: Family Medicine

## 2024-10-09 ENCOUNTER — Other Ambulatory Visit

## 2024-10-19 ENCOUNTER — Ambulatory Visit: Admitting: Urology

## 2024-12-26 ENCOUNTER — Ambulatory Visit
